# Patient Record
Sex: Female | Born: 1945 | Race: White | Hispanic: No | Smoking: Former smoker
Health system: Southern US, Community
[De-identification: ages and names within clinical notes are randomized; demographics above are authoritative.]

## PROBLEM LIST (undated history)

## (undated) DIAGNOSIS — E119 Type 2 diabetes mellitus without complications: Secondary | ICD-10-CM

## (undated) DIAGNOSIS — R0600 Dyspnea, unspecified: Secondary | ICD-10-CM

## (undated) DIAGNOSIS — E785 Hyperlipidemia, unspecified: Secondary | ICD-10-CM

## (undated) DIAGNOSIS — R011 Cardiac murmur, unspecified: Secondary | ICD-10-CM

## (undated) DIAGNOSIS — M1990 Unspecified osteoarthritis, unspecified site: Secondary | ICD-10-CM

## (undated) DIAGNOSIS — M797 Fibromyalgia: Secondary | ICD-10-CM

## (undated) DIAGNOSIS — R112 Nausea with vomiting, unspecified: Secondary | ICD-10-CM

## (undated) DIAGNOSIS — C801 Malignant (primary) neoplasm, unspecified: Secondary | ICD-10-CM

## (undated) DIAGNOSIS — K219 Gastro-esophageal reflux disease without esophagitis: Secondary | ICD-10-CM

## (undated) DIAGNOSIS — D649 Anemia, unspecified: Secondary | ICD-10-CM

## (undated) DIAGNOSIS — Z9889 Other specified postprocedural states: Secondary | ICD-10-CM

## (undated) DIAGNOSIS — F32A Depression, unspecified: Secondary | ICD-10-CM

## (undated) DIAGNOSIS — S91133A Puncture wound without foreign body of unspecified great toe without damage to nail, initial encounter: Secondary | ICD-10-CM

## (undated) DIAGNOSIS — F329 Major depressive disorder, single episode, unspecified: Secondary | ICD-10-CM

## (undated) DIAGNOSIS — G629 Polyneuropathy, unspecified: Secondary | ICD-10-CM

## (undated) HISTORY — PX: OTHER SURGICAL HISTORY: SHX169

## (undated) HISTORY — PX: CHOLECYSTECTOMY: SHX55

## (undated) HISTORY — PX: ABDOMINAL HYSTERECTOMY: SHX81

## (undated) MED FILL — Iron Sucrose Inj 20 MG/ML (Fe Equiv): INTRAVENOUS | Qty: 15 | Status: AC

---

## 2015-05-15 ENCOUNTER — Other Ambulatory Visit: Payer: Self-pay | Admitting: Physician Assistant
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
## 2015-05-17 ENCOUNTER — Encounter (HOSPITAL_COMMUNITY)
Admission: RE | Admit: 2015-05-17 | Discharge: 2015-05-17 | Disposition: A | Discharge status: Home / Self Care | Payer: Medicare Other | Source: Ambulatory Visit | Attending: Orthopedic Surgery | Admitting: Orthopedic Surgery

## 2015-05-17 ENCOUNTER — Encounter (HOSPITAL_COMMUNITY): Payer: Self-pay

## 2015-05-17 DIAGNOSIS — Z01810 Encounter for preprocedural cardiovascular examination: Secondary | ICD-10-CM | POA: Insufficient documentation

## 2015-05-17 DIAGNOSIS — M179 Osteoarthritis of knee, unspecified: Secondary | ICD-10-CM | POA: Insufficient documentation

## 2015-05-17 DIAGNOSIS — Z01812 Encounter for preprocedural laboratory examination: Secondary | ICD-10-CM | POA: Insufficient documentation

## 2015-05-17 DIAGNOSIS — Z0183 Encounter for blood typing: Secondary | ICD-10-CM | POA: Diagnosis not present

## 2015-05-17 HISTORY — DX: Type 2 diabetes mellitus without complications: E11.9

## 2015-05-17 HISTORY — DX: Hyperlipidemia, unspecified: E78.5

## 2015-05-17 HISTORY — DX: Other specified postprocedural states: Z98.890

## 2015-05-17 HISTORY — DX: Major depressive disorder, single episode, unspecified: F32.9

## 2015-05-17 HISTORY — DX: Depression, unspecified: F32.A

## 2015-05-17 HISTORY — DX: Anemia, unspecified: D64.9

## 2015-05-17 HISTORY — DX: Malignant (primary) neoplasm, unspecified: C80.1

## 2015-05-17 HISTORY — DX: Unspecified osteoarthritis, unspecified site: M19.90

## 2015-05-17 HISTORY — DX: Nausea with vomiting, unspecified: R11.2

## 2015-05-17 HISTORY — DX: Cardiac murmur, unspecified: R01.1

## 2015-05-17 HISTORY — DX: Gastro-esophageal reflux disease without esophagitis: K21.9

## 2015-05-17 HISTORY — DX: Fibromyalgia: M79.7

## 2015-05-17 LAB — COMPREHENSIVE METABOLIC PANEL
ALT: 31 U/L (ref 14–54)
AST: 38 U/L (ref 15–41)
Albumin: 3.6 g/dL (ref 3.5–5.0)
Alkaline Phosphatase: 50 U/L (ref 38–126)
Anion gap: 11 (ref 5–15)
BUN: 13 mg/dL (ref 6–20)
CO2: 26 mmol/L (ref 22–32)
Calcium: 9.4 mg/dL (ref 8.9–10.3)
Chloride: 100 mmol/L — ABNORMAL LOW (ref 101–111)
Creatinine, Ser: 0.97 mg/dL (ref 0.44–1.00)
GFR calc Af Amer: >60 mL/min (ref >60)
GFR calc non Af Amer: 57 mL/min — ABNORMAL LOW (ref >60)
Glucose, Bld: 225 mg/dL — ABNORMAL HIGH (ref 65–99)
Potassium: 3.6 mmol/L (ref 3.5–5.1)
Sodium: 137 mmol/L (ref 135–145)
Total Bilirubin: 0.5 mg/dL (ref 0.3–1.2)
Total Protein: 7.2 g/dL (ref 6.5–8.1)

## 2015-05-17 LAB — CBC WITH DIFFERENTIAL/PLATELET
Basophils Absolute: 0 10*3/uL (ref 0.0–0.1)
Basophils Relative: 0 %
Eosinophils Absolute: 0.4 10*3/uL (ref 0.0–0.7)
Eosinophils Relative: 4 %
HCT: 40.7 % (ref 36.0–46.0)
Hemoglobin: 13 g/dL (ref 12.0–15.0)
Lymphocytes Relative: 36 %
Lymphs Abs: 3.3 10*3/uL (ref 0.7–4.0)
MCH: 28.4 pg (ref 26.0–34.0)
MCHC: 31.9 g/dL (ref 30.0–36.0)
MCV: 89.1 fL (ref 78.0–100.0)
Monocytes Absolute: 0.6 10*3/uL (ref 0.1–1.0)
Monocytes Relative: 6 %
Neutro Abs: 4.9 10*3/uL (ref 1.7–7.7)
Neutrophils Relative %: 54 %
Platelets: 271 10*3/uL (ref 150–400)
RBC: 4.57 MIL/uL (ref 3.87–5.11)
RDW: 14.1 % (ref 11.5–15.5)
WBC: 9.2 10*3/uL (ref 4.0–10.5)

## 2015-05-17 LAB — TYPE AND SCREEN
ABO/RH(D): O POS
Antibody Screen: NEGATIVE

## 2015-05-17 LAB — SURGICAL PCR SCREEN
MRSA, PCR: NEGATIVE
Staphylococcus aureus: NEGATIVE

## 2015-05-17 LAB — APTT: aPTT: 28 seconds (ref 24–37)

## 2015-05-17 LAB — GLUCOSE, CAPILLARY: Glucose-Capillary: 226 mg/dL — ABNORMAL HIGH (ref 65–99)

## 2015-05-17 LAB — PROTIME-INR
INR: 1.04 (ref 0.00–1.49)
Prothrombin Time: 13.8 seconds (ref 11.6–15.2)

## 2015-05-17 LAB — ABO/RH: ABO/RH(D): O POS

## 2015-05-17 NOTE — Progress Notes (Signed)
Dr. Debroah Loop office notified that we do not have TED hose that will fit patient. Calf circumference 19 inches and thigh circumference 24 inches.

## 2015-05-17 NOTE — Pre-Procedure Instructions (Signed)
Wanda Curry  05/17/2015      [id number] [place], [place] - [place] [address] Phone: [phone] Fax: [phone]    Your procedure is scheduled on 05-30-2015   Wednesday   Report to [facility] Admitting at 9:00A.M.   Call this number if you have problems the morning of surgery:  [phone]   Remember:  Do not eat food or drink liquids after midnight.   Take these medicines the morning of surgery with A SIP OF WATER Carvedilol(Coreg),Gabapentin(neurontin),pain medication as needed,omeprazole(Prilosec),requip    Do not wear jewelry, make-up or nail polish.  Do not wear lotions, powders, or perfumes.  You may not wear deodorant.  Do not shave 48 hours prior to surgery.     Do not bring valuables to the hospital.  [facility] is not responsible for any belongings or valuables.  Contacts, dentures or bridgework may not be worn into surgery.  Leave your suitcase in the car.  After surgery it may be brought to your room.  For patients admitted to the hospital, discharge time will be determined by your treatment team.     Special instructions:  See attached sheet "Preparing for Surgery" for instructions on CHG shower      Please read over the following fact sheets that you were given. Pain Booklet, Coughing and Deep Breathing, Blood Transfusion Information and Surgical Site Infection Prevention                                                     How to Manage Your Diabetes Before Surgery   Why is it important to control my blood sugar before and after surgery?   Improving blood sugar levels before and after surgery helps healing and can limit problems.  A way of improving blood sugar control is eating a healthy diet by:  - Eating less sugar and carbohydrates  - Increasing activity/exercise  - Talk with your doctor about reaching your blood sugar goals  High blood sugars (greater than 180 mg/dL) can  raise your risk of infections and slow down your recovery so you will need to focus on controlling your diabetes during the weeks before surgery.  Make sure that the doctor who takes care of your diabetes knows about your planned surgery including the date and location.  How do I manage my blood sugars before surgery?   Check your blood sugar at least 4 times a day, 2 days before surgery to make sure that they are not too high or low.   Check your blood sugar the morning of your surgery when you wake up and every 2  hours until you get to the Short-Stay unit.  If your blood sugar is less than 70 mg/dL, you will need to treat for low blood sugar by:  Treat a low blood sugar (less than 70 mg/dL) with 1/2 cup of clear juice (cranberry or apple), 4 glucose tablets, OR glucose gel.  Recheck blood sugar in 15 minutes after treatment (to make sure it is greater than 70 mg/dL).  If blood sugar is not greater than 70 mg/dL on re-check, call [phone] for further instructions.   Report your blood sugar to the Short-Stay nurse when you get to Short-Stay.  References:  [facility],  2007 "How to Manage your Diabetes Before and After Surgery".  What do I do about my diabetes medications?   Do not take oral diabetes medicines (pills) the morning of surgery.    THE NIGHT BEFORE SURGERY, take 14 units of  Novolog  Insulin and 44 units of lantus    THE MORNING OF SURGERY, take 11 units of Novolog  Insulin.    Do not take other diabetes injectables the day of surgery including Byetta, Victoza, Bydureon, and Trulicity.        .     .  .

## 2015-05-18 LAB — URINE CULTURE

## 2015-05-18 LAB — HEMOGLOBIN A1C
Hgb A1c MFr Bld: 8.8 % — ABNORMAL HIGH (ref 4.8–5.6)
Mean Plasma Glucose: 206 mg/dL

## 2015-05-28 NOTE — Progress Notes (Signed)
HgA1C 8.8.  Will have Bryson Ha or [place] review.  Called Dr Rosalita Chessman in [place] and requested Echo done on 05/25/15.  They state they will fax this over.

## 2015-05-29 ENCOUNTER — Encounter (HOSPITAL_COMMUNITY): Payer: Self-pay

## 2015-05-29 MED ORDER — CEFAZOLIN SODIUM-DEXTROSE 2-3 GM-% IV SOLR
2.0000 g | INTRAVENOUS | Status: AC
  Administered 2015-05-30: 2 g via INTRAVENOUS
  Filled 2015-05-29: qty 50
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
## 2015-05-30 ENCOUNTER — Inpatient Hospital Stay (HOSPITAL_COMMUNITY): Payer: Medicare Other | Admitting: Certified Registered Nurse Anesthetist

## 2015-05-30 ENCOUNTER — Inpatient Hospital Stay (HOSPITAL_COMMUNITY): Payer: Medicare Other

## 2015-05-30 ENCOUNTER — Encounter (HOSPITAL_COMMUNITY)
Admission: RE | Disposition: A | Discharge status: Home Health | Payer: Self-pay | Source: Ambulatory Visit | Attending: Orthopedic Surgery

## 2015-05-30 ENCOUNTER — Inpatient Hospital Stay (HOSPITAL_COMMUNITY): Payer: Medicare Other | Admitting: Vascular Surgery

## 2015-05-30 ENCOUNTER — Encounter (HOSPITAL_COMMUNITY): Payer: Self-pay | Admitting: *Deleted

## 2015-05-30 ENCOUNTER — Inpatient Hospital Stay (HOSPITAL_COMMUNITY)
Admission: RE | Admit: 2015-05-30 | Discharge: 2015-05-31 | DRG: 470 | Disposition: A | Discharge status: Home Health | Payer: Medicare Other | Source: Ambulatory Visit | Attending: Orthopedic Surgery | Admitting: Orthopedic Surgery

## 2015-05-30 DIAGNOSIS — Z96659 Presence of unspecified artificial knee joint: Secondary | ICD-10-CM

## 2015-05-30 DIAGNOSIS — Z85828 Personal history of other malignant neoplasm of skin: Secondary | ICD-10-CM | POA: Diagnosis not present

## 2015-05-30 DIAGNOSIS — M1731 Unilateral post-traumatic osteoarthritis, right knee: Principal | ICD-10-CM | POA: Diagnosis present

## 2015-05-30 DIAGNOSIS — E785 Hyperlipidemia, unspecified: Secondary | ICD-10-CM | POA: Diagnosis present

## 2015-05-30 DIAGNOSIS — D62 Acute posthemorrhagic anemia: Secondary | ICD-10-CM | POA: Diagnosis not present

## 2015-05-30 DIAGNOSIS — K219 Gastro-esophageal reflux disease without esophagitis: Secondary | ICD-10-CM | POA: Diagnosis present

## 2015-05-30 DIAGNOSIS — Z7982 Long term (current) use of aspirin: Secondary | ICD-10-CM

## 2015-05-30 DIAGNOSIS — M797 Fibromyalgia: Secondary | ICD-10-CM | POA: Diagnosis not present

## 2015-05-30 DIAGNOSIS — M179 Osteoarthritis of knee, unspecified: Secondary | ICD-10-CM | POA: Diagnosis present

## 2015-05-30 DIAGNOSIS — Z87891 Personal history of nicotine dependence: Secondary | ICD-10-CM | POA: Diagnosis not present

## 2015-05-30 DIAGNOSIS — M25561 Pain in right knee: Secondary | ICD-10-CM | POA: Diagnosis present

## 2015-05-30 DIAGNOSIS — M069 Rheumatoid arthritis, unspecified: Secondary | ICD-10-CM | POA: Diagnosis present

## 2015-05-30 DIAGNOSIS — E119 Type 2 diabetes mellitus without complications: Secondary | ICD-10-CM | POA: Diagnosis present

## 2015-05-30 DIAGNOSIS — M1710 Unilateral primary osteoarthritis, unspecified knee: Secondary | ICD-10-CM | POA: Diagnosis present

## 2015-05-30 HISTORY — PX: HARDWARE REMOVAL: SHX979

## 2015-05-30 HISTORY — PX: TOTAL KNEE ARTHROPLASTY: SHX125

## 2015-05-30 LAB — GLUCOSE, CAPILLARY
Glucose-Capillary: 190 mg/dL — ABNORMAL HIGH (ref 65–99)
Glucose-Capillary: 223 mg/dL — ABNORMAL HIGH (ref 65–99)
Glucose-Capillary: 253 mg/dL — ABNORMAL HIGH (ref 65–99)
Glucose-Capillary: 321 mg/dL — ABNORMAL HIGH (ref 65–99)

## 2015-05-30 SURGERY — ARTHROPLASTY, KNEE, TOTAL
Anesthesia: General | Laterality: Right

## 2015-05-30 MED ORDER — HYDROMORPHONE HCL 1 MG/ML IJ SOLN
0.2500 mg | INTRAMUSCULAR | Status: DC | PRN
  Administered 2015-05-30 (×4): 0.5 mg via INTRAVENOUS

## 2015-05-30 MED ORDER — CHLORHEXIDINE GLUCONATE 4 % EX LIQD
60.0000 mL | Freq: Once | CUTANEOUS | Status: DC
Start: 2015-05-30 — End: 2015-05-30

## 2015-05-30 MED ORDER — MIDAZOLAM HCL 5 MG/5ML IJ SOLN
INTRAMUSCULAR | Status: DC | PRN
  Administered 2015-05-30: 2 mg via INTRAVENOUS

## 2015-05-30 MED ORDER — METOCLOPRAMIDE HCL 5 MG/ML IJ SOLN: 5.0000 mg | Freq: Three times a day (TID) | INTRAMUSCULAR | Status: DC | PRN

## 2015-05-30 MED ORDER — SODIUM CHLORIDE 0.9 % IJ SOLN
INTRAMUSCULAR | Status: DC | PRN
  Administered 2015-05-30: 10 mL via INTRAVENOUS

## 2015-05-30 MED ORDER — CEFAZOLIN SODIUM-DEXTROSE 2-3 GM-% IV SOLR
2.0000 g | Freq: Four times a day (QID) | INTRAVENOUS | Status: AC
  Administered 2015-05-30 (×2): 2 g via INTRAVENOUS
  Filled 2015-05-30 (×2): qty 50

## 2015-05-30 MED ORDER — MENTHOL 3 MG MT LOZG: 1.0000 | LOZENGE | OROMUCOSAL | Status: DC | PRN

## 2015-05-30 MED ORDER — ACETAMINOPHEN 325 MG PO TABS: 650.0000 mg | ORAL_TABLET | Freq: Four times a day (QID) | ORAL | Status: DC | PRN

## 2015-05-30 MED ORDER — HYDROCHLOROTHIAZIDE 25 MG PO TABS
25.0000 mg | ORAL_TABLET | Freq: Every day | ORAL | Status: DC
  Administered 2015-05-30 – 2015-05-31 (×2): 25 mg via ORAL
  Filled 2015-05-30 (×2): qty 1

## 2015-05-30 MED ORDER — PANTOPRAZOLE SODIUM 40 MG PO TBEC
80.0000 mg | DELAYED_RELEASE_TABLET | Freq: Every day | ORAL | Status: DC
  Administered 2015-05-30 – 2015-05-31 (×2): 80 mg via ORAL
  Filled 2015-05-30 (×2): qty 2

## 2015-05-30 MED ORDER — DOCUSATE SODIUM 100 MG PO CAPS
100.0000 mg | ORAL_CAPSULE | Freq: Two times a day (BID) | ORAL | Status: DC
  Administered 2015-05-30 – 2015-05-31 (×3): 100 mg via ORAL
  Filled 2015-05-30 (×3): qty 1

## 2015-05-30 MED ORDER — OXYCODONE-ACETAMINOPHEN 5-325 MG PO TABS: 1.0000 | ORAL_TABLET | ORAL | Status: DC | PRN

## 2015-05-30 MED ORDER — HYDROMORPHONE HCL 1 MG/ML IJ SOLN
0.5000 mg | INTRAMUSCULAR | Status: DC | PRN
  Administered 2015-05-30 – 2015-05-31 (×2): 0.5 mg via INTRAVENOUS
  Filled 2015-05-30 (×2): qty 1

## 2015-05-30 MED ORDER — BISACODYL 5 MG PO TBEC: 5.0000 mg | DELAYED_RELEASE_TABLET | Freq: Every day | ORAL | Status: DC | PRN

## 2015-05-30 MED ORDER — BUPIVACAINE HCL (PF) 0.25 % IJ SOLN
INTRAMUSCULAR | Status: AC
  Filled 2015-05-30: qty 60

## 2015-05-30 MED ORDER — APIXABAN 2.5 MG PO TABS: ORAL_TABLET | ORAL | Status: DC

## 2015-05-30 MED ORDER — OXYCODONE HCL 5 MG PO TABS
5.0000 mg | ORAL_TABLET | ORAL | Status: DC | PRN
  Administered 2015-05-30: 10 mg via ORAL
  Administered 2015-05-30 – 2015-05-31 (×6): 5 mg via ORAL
  Filled 2015-05-30 (×6): qty 1

## 2015-05-30 MED ORDER — ZOLPIDEM TARTRATE 5 MG PO TABS: 5.0000 mg | ORAL_TABLET | Freq: Every evening | ORAL | Status: DC | PRN

## 2015-05-30 MED ORDER — ONDANSETRON HCL 4 MG/2ML IJ SOLN: 4.0000 mg | Freq: Four times a day (QID) | INTRAMUSCULAR | Status: DC | PRN

## 2015-05-30 MED ORDER — APIXABAN 2.5 MG PO TABS
2.5000 mg | ORAL_TABLET | Freq: Two times a day (BID) | ORAL | Status: DC
  Administered 2015-05-31: 2.5 mg via ORAL
  Filled 2015-05-30: qty 1

## 2015-05-30 MED ORDER — MAGNESIUM CITRATE PO SOLN: 1.0000 | Freq: Once | ORAL | Status: DC | PRN

## 2015-05-30 MED ORDER — ONDANSETRON HCL 4 MG/2ML IJ SOLN
INTRAMUSCULAR | Status: DC | PRN
  Administered 2015-05-30: 4 mg via INTRAVENOUS

## 2015-05-30 MED ORDER — INSULIN ASPART 100 UNIT/ML ~~LOC~~ SOLN
0.0000 [IU] | Freq: Three times a day (TID) | SUBCUTANEOUS | Status: DC
  Administered 2015-05-30: 5 [IU] via SUBCUTANEOUS
  Administered 2015-05-31: 2 [IU] via SUBCUTANEOUS
  Administered 2015-05-31: 3 [IU] via SUBCUTANEOUS

## 2015-05-30 MED ORDER — GABAPENTIN 600 MG PO TABS
600.0000 mg | ORAL_TABLET | Freq: Three times a day (TID) | ORAL | Status: DC
  Administered 2015-05-30 – 2015-05-31 (×3): 600 mg via ORAL
  Filled 2015-05-30 (×6): qty 1

## 2015-05-30 MED ORDER — POTASSIUM CHLORIDE IN NACL 20-0.9 MEQ/L-% IV SOLN
INTRAVENOUS | Status: DC
  Administered 2015-05-30: 16:00:00 via INTRAVENOUS
  Filled 2015-05-30 (×2): qty 1000

## 2015-05-30 MED ORDER — LIDOCAINE HCL (CARDIAC) 20 MG/ML IV SOLN
INTRAVENOUS | Status: DC | PRN
Start: 2015-05-30 — End: 2015-05-30
  Administered 2015-05-30: 50 mg via INTRAVENOUS

## 2015-05-30 MED ORDER — HYDROMORPHONE HCL 1 MG/ML IJ SOLN
INTRAMUSCULAR | Status: AC
  Administered 2015-05-30: 0.5 mg via INTRAVENOUS
  Filled 2015-05-30: qty 1

## 2015-05-30 MED ORDER — METHOCARBAMOL 500 MG PO TABS
500.0000 mg | ORAL_TABLET | Freq: Four times a day (QID) | ORAL | Status: DC | PRN
  Administered 2015-05-30 – 2015-05-31 (×4): 500 mg via ORAL
  Filled 2015-05-30 (×5): qty 1

## 2015-05-30 MED ORDER — METOCLOPRAMIDE HCL 5 MG PO TABS: 5.0000 mg | ORAL_TABLET | Freq: Three times a day (TID) | ORAL | Status: DC | PRN

## 2015-05-30 MED ORDER — ATORVASTATIN CALCIUM 20 MG PO TABS
20.0000 mg | ORAL_TABLET | Freq: Every day | ORAL | Status: DC
  Administered 2015-05-30 – 2015-05-31 (×2): 20 mg via ORAL
  Filled 2015-05-30: qty 1
  Filled 2015-05-30: qty 2
  Filled 2015-05-30: qty 1

## 2015-05-30 MED ORDER — FENTANYL CITRATE (PF) 100 MCG/2ML IJ SOLN
INTRAMUSCULAR | Status: DC | PRN
  Administered 2015-05-30 (×2): 25 ug via INTRAVENOUS
  Administered 2015-05-30: 50 ug via INTRAVENOUS
  Administered 2015-05-30 (×2): 25 ug via INTRAVENOUS
  Administered 2015-05-30: 75 ug via INTRAVENOUS
  Administered 2015-05-30: 25 ug via INTRAVENOUS

## 2015-05-30 MED ORDER — BUPIVACAINE LIPOSOME 1.3 % IJ SUSP
20.0000 mL | INTRAMUSCULAR | Status: AC
  Administered 2015-05-30: 20 mL
  Filled 2015-05-30: qty 20

## 2015-05-30 MED ORDER — SCOPOLAMINE 1 MG/3DAYS TD PT72
MEDICATED_PATCH | TRANSDERMAL | Status: DC | PRN
  Administered 2015-05-30: 1 via TRANSDERMAL

## 2015-05-30 MED ORDER — INFLUENZA VAC SPLIT QUAD 0.5 ML IM SUSY
0.5000 mL | PREFILLED_SYRINGE | INTRAMUSCULAR | Status: AC
  Administered 2015-05-31: 0.5 mL via INTRAMUSCULAR
  Filled 2015-05-30: qty 0.5

## 2015-05-30 MED ORDER — TRANEXAMIC ACID 1000 MG/10ML IV SOLN
1000.0000 mg | INTRAVENOUS | Status: AC
  Administered 2015-05-30: 1000 mg via INTRAVENOUS
  Filled 2015-05-30: qty 10

## 2015-05-30 MED ORDER — SCOPOLAMINE 1 MG/3DAYS TD PT72
MEDICATED_PATCH | TRANSDERMAL | Status: AC
  Filled 2015-05-30: qty 1

## 2015-05-30 MED ORDER — DEXAMETHASONE SODIUM PHOSPHATE 4 MG/ML IJ SOLN
INTRAMUSCULAR | Status: AC
  Filled 2015-05-30: qty 1

## 2015-05-30 MED ORDER — ROPINIROLE HCL 1 MG PO TABS
1.0000 mg | ORAL_TABLET | Freq: Three times a day (TID) | ORAL | Status: DC
  Administered 2015-05-30 – 2015-05-31 (×3): 1 mg via ORAL
  Filled 2015-05-30 (×3): qty 1

## 2015-05-30 MED ORDER — ONDANSETRON HCL 4 MG PO TABS: 4.0000 mg | ORAL_TABLET | Freq: Four times a day (QID) | ORAL | Status: DC | PRN

## 2015-05-30 MED ORDER — PROMETHAZINE HCL 25 MG/ML IJ SOLN: 6.2500 mg | INTRAMUSCULAR | Status: DC | PRN

## 2015-05-30 MED ORDER — MIDAZOLAM HCL 2 MG/2ML IJ SOLN
INTRAMUSCULAR | Status: AC
  Filled 2015-05-30: qty 4

## 2015-05-30 MED ORDER — LACTATED RINGERS IV SOLN
INTRAVENOUS | Status: DC
  Administered 2015-05-30 (×2): via INTRAVENOUS

## 2015-05-30 MED ORDER — METHOCARBAMOL 500 MG PO TABS: 500.0000 mg | ORAL_TABLET | Freq: Four times a day (QID) | ORAL | Status: DC

## 2015-05-30 MED ORDER — ONDANSETRON HCL 4 MG PO TABS: 4.0000 mg | ORAL_TABLET | Freq: Three times a day (TID) | ORAL | Status: DC | PRN

## 2015-05-30 MED ORDER — PHENOL 1.4 % MT LIQD: 1.0000 | OROMUCOSAL | Status: DC | PRN

## 2015-05-30 MED ORDER — BUPIVACAINE HCL (PF) 0.5 % IJ SOLN
INTRAMUSCULAR | Status: AC
  Filled 2015-05-30: qty 30

## 2015-05-30 MED ORDER — DEXAMETHASONE SODIUM PHOSPHATE 4 MG/ML IJ SOLN
INTRAMUSCULAR | Status: DC | PRN
  Administered 2015-05-30: 4 mg via INTRAVENOUS

## 2015-05-30 MED ORDER — ACETAMINOPHEN 650 MG RE SUPP: 650.0000 mg | Freq: Four times a day (QID) | RECTAL | Status: DC | PRN

## 2015-05-30 MED ORDER — LIDOCAINE HCL (CARDIAC) 20 MG/ML IV SOLN
INTRAVENOUS | Status: AC
  Filled 2015-05-30: qty 10

## 2015-05-30 MED ORDER — FENTANYL CITRATE (PF) 250 MCG/5ML IJ SOLN
INTRAMUSCULAR | Status: AC
  Filled 2015-05-30: qty 5

## 2015-05-30 MED ORDER — LABETALOL HCL 5 MG/ML IV SOLN
INTRAVENOUS | Status: DC | PRN
  Administered 2015-05-30 (×2): 5 mg via INTRAVENOUS

## 2015-05-30 MED ORDER — METHOCARBAMOL 500 MG PO TABS
ORAL_TABLET | ORAL | Status: AC
  Administered 2015-05-30: 500 mg via ORAL
  Filled 2015-05-30: qty 1

## 2015-05-30 MED ORDER — SENNOSIDES-DOCUSATE SODIUM 8.6-50 MG PO TABS: 1.0000 | ORAL_TABLET | Freq: Every evening | ORAL | Status: DC | PRN

## 2015-05-30 MED ORDER — SODIUM CHLORIDE 0.9 % IR SOLN
Status: DC | PRN
  Administered 2015-05-30: 3000 mL

## 2015-05-30 MED ORDER — PROPOFOL 10 MG/ML IV BOLUS
INTRAVENOUS | Status: DC | PRN
  Administered 2015-05-30: 120 mg via INTRAVENOUS

## 2015-05-30 MED ORDER — CARVEDILOL 6.25 MG PO TABS
6.2500 mg | ORAL_TABLET | Freq: Two times a day (BID) | ORAL | Status: DC
  Administered 2015-05-30 – 2015-05-31 (×2): 6.25 mg via ORAL
  Filled 2015-05-30 (×2): qty 1

## 2015-05-30 MED ORDER — CHLORHEXIDINE GLUCONATE 4 % EX LIQD: 60.0000 mL | Freq: Once | CUTANEOUS | Status: DC

## 2015-05-30 MED ORDER — BUPIVACAINE HCL 0.5 % IJ SOLN
INTRAMUSCULAR | Status: DC | PRN
  Administered 2015-05-30: 30 mL

## 2015-05-30 MED ORDER — OXYCODONE HCL 5 MG PO TABS
ORAL_TABLET | ORAL | Status: AC
  Filled 2015-05-30: qty 2

## 2015-05-30 MED ORDER — DIPHENHYDRAMINE HCL 12.5 MG/5ML PO ELIX: 12.5000 mg | ORAL_SOLUTION | ORAL | Status: DC | PRN

## 2015-05-30 MED ORDER — BUPIVACAINE HCL (PF) 0.25 % IJ SOLN: INTRAMUSCULAR | Status: DC | PRN

## 2015-05-30 MED ORDER — METHOCARBAMOL 1000 MG/10ML IJ SOLN
500.0000 mg | Freq: Four times a day (QID) | INTRAVENOUS | Status: DC | PRN
  Filled 2015-05-30: qty 5

## 2015-05-30 SURGICAL SUPPLY — 85 items
BANDAGE ELASTIC 4 VELCRO ST LF (GAUZE/BANDAGES/DRESSINGS) ×3 IMPLANT
BANDAGE ELASTIC 6 VELCRO ST LF (GAUZE/BANDAGES/DRESSINGS) ×3 IMPLANT
BANDAGE ESMARK 6X9 LF (GAUZE/BANDAGES/DRESSINGS) ×1 IMPLANT
BENZOIN TINCTURE PRP APPL 2/3 (GAUZE/BANDAGES/DRESSINGS) ×3 IMPLANT
BLADE SAG 18X100X1.27 (BLADE) ×6 IMPLANT
BLADE SURG 10 STRL SS (BLADE) ×9 IMPLANT
BNDG ESMARK 6X9 LF (GAUZE/BANDAGES/DRESSINGS) ×3
BOOTCOVER CLEANROOM LRG (PROTECTIVE WEAR) ×6 IMPLANT
BOWL SMART MIX CTS (DISPOSABLE) ×3 IMPLANT
CAPT KNEE TOTAL 3 ×3 IMPLANT
CEMENT BONE SIMPLEX SPEEDSET (Cement) ×6 IMPLANT
CLOSURE STERI-STRIP 1/2X4 (GAUZE/BANDAGES/DRESSINGS) ×1
CLOSURE WOUND 1/2 X4 (GAUZE/BANDAGES/DRESSINGS) ×2
CLSR STERI-STRIP ANTIMIC 1/2X4 (GAUZE/BANDAGES/DRESSINGS) ×2 IMPLANT
COVER SURGICAL LIGHT HANDLE (MISCELLANEOUS) ×3 IMPLANT
CUFF TOURNIQUET SINGLE 18IN (TOURNIQUET CUFF) IMPLANT
CUFF TOURNIQUET SINGLE 24IN (TOURNIQUET CUFF) IMPLANT
CUFF TOURNIQUET SINGLE 34IN LL (TOURNIQUET CUFF) ×3 IMPLANT
CUFF TOURNIQUET SINGLE 44IN (TOURNIQUET CUFF) ×3 IMPLANT
DECANTER SPIKE VIAL GLASS SM (MISCELLANEOUS) IMPLANT
DRAPE C-ARM 42X72 X-RAY (DRAPES) IMPLANT
DRAPE EXTREMITY T 121X128X90 (DRAPE) ×3 IMPLANT
DRAPE IMP U-DRAPE 54X76 (DRAPES) ×3 IMPLANT
DRAPE OEC MINIVIEW 54X84 (DRAPES) IMPLANT
DRAPE PROXIMA HALF (DRAPES) ×3 IMPLANT
DRAPE U-SHAPE 47X51 STRL (DRAPES) ×3 IMPLANT
DRAPE X RAY CASS MED 25220 (DRAPES) IMPLANT
DRAPE X-RAY CASS 24X20 (DRAPES) IMPLANT
DRSG PAD ABDOMINAL 8X10 ST (GAUZE/BANDAGES/DRESSINGS) ×3 IMPLANT
DURAPREP 26ML APPLICATOR (WOUND CARE) ×6 IMPLANT
ELECT CAUTERY BLADE 6.4 (BLADE) ×3 IMPLANT
ELECT REM PT RETURN 9FT ADLT (ELECTROSURGICAL) ×3
ELECTRODE REM PT RTRN 9FT ADLT (ELECTROSURGICAL) ×1 IMPLANT
EVACUATOR 1/8 PVC DRAIN (DRAIN) ×3 IMPLANT
FACESHIELD WRAPAROUND (MASK) ×9 IMPLANT
GAUZE SPONGE 4X4 12PLY STRL (GAUZE/BANDAGES/DRESSINGS) ×3 IMPLANT
GAUZE XEROFORM 1X8 LF (GAUZE/BANDAGES/DRESSINGS) ×6 IMPLANT
GLOVE BIOGEL PI IND STRL 7.0 (GLOVE) ×4 IMPLANT
GLOVE BIOGEL PI INDICATOR 7.0 (GLOVE) ×8
GLOVE ECLIPSE 7.0 STRL STRAW (GLOVE) ×3 IMPLANT
GLOVE ORTHO TXT STRL SZ7.5 (GLOVE) ×6 IMPLANT
GLOVE SURG ORTHO 7.0 STRL STRW (GLOVE) ×3 IMPLANT
GOWN STRL REUS W/ TWL LRG LVL3 (GOWN DISPOSABLE) ×2 IMPLANT
GOWN STRL REUS W/ TWL XL LVL3 (GOWN DISPOSABLE) ×1 IMPLANT
GOWN STRL REUS W/TWL LRG LVL3 (GOWN DISPOSABLE) ×4
GOWN STRL REUS W/TWL XL LVL3 (GOWN DISPOSABLE) ×2
HANDPIECE INTERPULSE COAX TIP (DISPOSABLE) ×2
IMMOBILIZER KNEE 22 UNIV (SOFTGOODS) ×3 IMPLANT
IMMOBILIZER KNEE 24 THIGH 36 (MISCELLANEOUS) IMPLANT
IMMOBILIZER KNEE 24 UNIV (MISCELLANEOUS)
KIT BASIN OR (CUSTOM PROCEDURE TRAY) ×3 IMPLANT
KIT ROOM TURNOVER OR (KITS) ×3 IMPLANT
MANIFOLD NEPTUNE II (INSTRUMENTS) ×3 IMPLANT
NEEDLE 18GX1X1/2 (RX/OR ONLY) (NEEDLE) ×3 IMPLANT
NEEDLE HYPO 25GX1X1/2 BEV (NEEDLE) ×3 IMPLANT
NS IRRIG 1000ML POUR BTL (IV SOLUTION) ×3 IMPLANT
PACK ORTHO EXTREMITY (CUSTOM PROCEDURE TRAY) ×3 IMPLANT
PACK TOTAL JOINT (CUSTOM PROCEDURE TRAY) ×3 IMPLANT
PACK UNIVERSAL I (CUSTOM PROCEDURE TRAY) ×3 IMPLANT
PAD ARMBOARD 7.5X6 YLW CONV (MISCELLANEOUS) ×6 IMPLANT
PAD CAST 4YDX4 CTTN HI CHSV (CAST SUPPLIES) ×2 IMPLANT
PADDING CAST COTTON 4X4 STRL (CAST SUPPLIES) ×4
SET HNDPC FAN SPRY TIP SCT (DISPOSABLE) ×1 IMPLANT
SPONGE LAP 4X18 X RAY DECT (DISPOSABLE) ×6 IMPLANT
STAPLER VISISTAT 35W (STAPLE) ×3 IMPLANT
STRIP CLOSURE SKIN 1/2X4 (GAUZE/BANDAGES/DRESSINGS) ×4 IMPLANT
SUCTION FRAZIER TIP 10 FR DISP (SUCTIONS) ×3 IMPLANT
SUT MNCRL AB 4-0 PS2 18 (SUTURE) ×6 IMPLANT
SUT VIC AB 0 CT1 27 (SUTURE)
SUT VIC AB 0 CT1 27XBRD ANBCTR (SUTURE) IMPLANT
SUT VIC AB 0 CTB1 27 (SUTURE) ×6 IMPLANT
SUT VIC AB 1 CTX 36 (SUTURE) ×2
SUT VIC AB 1 CTX36XBRD ANBCTR (SUTURE) ×1 IMPLANT
SUT VIC AB 2-0 CT1 27 (SUTURE) ×4
SUT VIC AB 2-0 CT1 TAPERPNT 27 (SUTURE) ×2 IMPLANT
SUT VIC AB 2-0 CTB1 (SUTURE) ×12 IMPLANT
SYR 50ML LL SCALE MARK (SYRINGE) ×3 IMPLANT
SYR CONTROL 10ML LL (SYRINGE) ×3 IMPLANT
TOWEL OR 17X24 6PK STRL BLUE (TOWEL DISPOSABLE) ×3 IMPLANT
TOWEL OR 17X26 10 PK STRL BLUE (TOWEL DISPOSABLE) ×3 IMPLANT
TUBE CONNECTING 12'X1/4 (SUCTIONS) ×1
TUBE CONNECTING 12X1/4 (SUCTIONS) ×2 IMPLANT
UNDERPAD 30X30 INCONTINENT (UNDERPADS AND DIAPERS) ×3 IMPLANT
WATER STERILE IRR 1000ML POUR (IV SOLUTION) ×3 IMPLANT
YANKAUER SUCT BULB TIP NO VENT (SUCTIONS) IMPLANT

## 2015-05-30 NOTE — Interval H&P Note (Signed)
History and Physical Interval Note:  05/30/2015 8:34 AM  Wanda Curry  has presented today for surgery, with the diagnosis of DJD RIGHT KNEE, MECHANICAL COMPLICATION  The various methods of treatment have been discussed with the patient and family. After consideration of risks, benefits and other options for treatment, the patient has consented to  Procedure(s): RIGHT TOTAL KNEE ARTHROPLASTY (Right) HARDWARE REMOVAL (Right) as a surgical intervention .  The patient's history has been reviewed, patient examined, no change in status, stable for surgery.  I have reviewed the patient's chart and labs.  Questions were answered to the patient's satisfaction.     Wanda Curry
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
## 2015-05-30 NOTE — Progress Notes (Signed)
Orthopedic Tech Progress Note Patient Details:  Wanda Curry [birth year] [id number] Applied CPM to RLE.  Applied OHF with trapeze to pt.'s bed.  Left Bone Foam with pt.'s nurse. CPM Right Knee CPM Right Knee: On Right Knee Flexion (Degrees): 90 Right Knee Extension (Degrees): 0   Darrol Poke 05/30/2015, 1:56 PM

## 2015-05-30 NOTE — Discharge Instructions (Signed)
INSTRUCTIONS AFTER JOINT REPLACEMENT   o Remove items at home which could result in a fall. This includes throw rugs or furniture in walking pathways o ICE to the affected joint every three hours while awake for 30 minutes at a time, for at least the first 3-5 days, and then as needed for pain and swelling.  Continue to use ice for pain and swelling. You may notice swelling that will progress down to the foot and ankle.  This is normal after surgery.  Elevate your leg when you are not up walking on it.   o Continue to use the breathing machine you got in the hospital (incentive spirometer) which will help keep your temperature down.  It is common for your temperature to cycle up and down following surgery, especially at night when you are not up moving around and exerting yourself.  The breathing machine keeps your lungs expanded and your temperature down.  TAKE ELIQUIS AS DIRECTED FOR A TOTAL OF 14 DAYS FOLLOWING SURGERY.  ONCE FINISHED WITH THIS, TAKE ASPIRIN 325 MG ONE TAB ONCE DAILY FOR THE NEXT 14 DAYS.  THESE MEDICATIONS ARE USED TO PREVENT BLOOD CLOTS.  DIET:  As you were doing prior to hospitalization, we recommend a well-balanced diet.  DRESSING / WOUND CARE / SHOWERING  You may change your dressing 3-5 days after surgery.  Then change the dressing every day with sterile gauze.  Please use good hand washing techniques before changing the dressing.  Do not use any lotions or creams on the incision until instructed by your surgeon. and You may shower 3 days after surgery, but keep the wounds dry during showering.  You may use an occlusive plastic wrap (Press'n Seal for example), NO SOAKING/SUBMERGING IN THE BATHTUB.  If the bandage gets wet, change with a clean dry gauze.  If the incision gets wet, pat the wound dry with a clean towel.  ACTIVITY  o Increase activity slowly as tolerated, but follow the weight bearing instructions below.   o No driving for 6 weeks or until further direction  given by your physician.  You cannot drive while taking narcotics.  o No lifting or carrying greater than 10 lbs. until further directed by your surgeon. o Avoid periods of inactivity such as sitting longer than an hour when not asleep. This helps prevent blood clots.  o You may return to work once you are authorized by your doctor.     WEIGHT BEARING   Weight bearing as tolerated with assist device (walker, cane, etc) as directed, use it as long as suggested by your surgeon or therapist, typically at least 4-6 weeks.   EXERCISES  Results after joint replacement surgery are often greatly improved when you follow the exercise, range of motion and muscle strengthening exercises prescribed by your doctor. Safety measures are also important to protect the joint from further injury. Any time any of these exercises cause you to have increased pain or swelling, decrease what you are doing until you are comfortable again and then slowly increase them. If you have problems or questions, call your caregiver or physical therapist for advice.   Rehabilitation is important following a joint replacement. After just a few days of immobilization, the muscles of the leg can become weakened and shrink (atrophy).  These exercises are designed to build up the tone and strength of the thigh and leg muscles and to improve motion. Often times heat used for twenty to thirty minutes before working out will loosen  up your tissues and help with improving the range of motion but do not use heat for the first two weeks following surgery (sometimes heat can increase post-operative swelling).   These exercises can be done on a training (exercise) mat, on the floor, on a table or on a bed. Use whatever works the best and is most comfortable for you.    Use music or television while you are exercising so that the exercises are a pleasant break in your day. This will make your life better with the exercises acting as a break in  your routine that you can look forward to.   Perform all exercises about fifteen times, three times per day or as directed.  You should exercise both the operative leg and the other leg as well.  Exercises include:    Quad Sets - Tighten up the muscle on the front of the thigh (Quad) and hold for 5-10 seconds.    Straight Leg Raises - With your knee straight (if you were given a brace, keep it on), lift the leg to 60 degrees, hold for 3 seconds, and slowly lower the leg.  Perform this exercise against resistance later as your leg gets stronger.   Leg Slides: Lying on your back, slowly slide your foot toward your buttocks, bending your knee up off the floor (only go as far as is comfortable). Then slowly slide your foot back down until your leg is flat on the floor again.   Angel Wings: Lying on your back spread your legs to the side as far apart as you can without causing discomfort.   Hamstring Strength:  Lying on your back, push your heel against the floor with your leg straight by tightening up the muscles of your buttocks.  Repeat, but this time bend your knee to a comfortable angle, and push your heel against the floor.  You may put a pillow under the heel to make it more comfortable if necessary.   A rehabilitation program following joint replacement surgery can speed recovery and prevent re-injury in the future due to weakened muscles. Contact your doctor or a physical therapist for more information on knee rehabilitation.    CONSTIPATION  Constipation is defined medically as fewer than three stools per week and severe constipation as less than one stool per week.  Even if you have a regular bowel pattern at home, your normal regimen is likely to be disrupted due to multiple reasons following surgery.  Combination of anesthesia, postoperative narcotics, change in appetite and fluid intake all can affect your bowels.   YOU MUST use at least one of the following options; they are listed in  order of increasing strength to get the job done.  They are all available over the counter, and you may need to use some, POSSIBLY even all of these options:    Drink plenty of fluids (prune juice may be helpful) and high fiber foods Colace 100 mg by mouth twice a day  Senokot for constipation as directed and as needed Dulcolax (bisacodyl), take with full glass of water  Miralax (polyethylene glycol) once or twice a day as needed.  If you have tried all these things and are unable to have a bowel movement in the first 3-4 days after surgery call either your surgeon or your primary doctor.    If you experience loose stools or diarrhea, hold the medications until you stool forms back up.  If your symptoms do not get better within 1  week or if they get worse, check with your doctor.  If you experience "the worst abdominal pain ever" or develop nausea or vomiting, please contact the office immediately for further recommendations for treatment.   ITCHING:  If you experience itching with your medications, try taking only a single pain pill, or even half a pain pill at a time.  You can also use Benadryl over the counter for itching or also to help with sleep.   TED HOSE STOCKINGS:  Use stockings on both legs until for at least 2 weeks or as directed by physician office. They may be removed at night for sleeping.  MEDICATIONS:  See your medication summary on the After Visit Summary that nursing will review with you.  You may have some home medications which will be placed on hold until you complete the course of blood thinner medication.  It is important for you to complete the blood thinner medication as prescribed.  PRECAUTIONS:  If you experience chest pain or shortness of breath - call 911 immediately for transfer to the hospital emergency department.   If you develop a fever greater that 101 F, purulent drainage from wound, increased redness or drainage from wound, foul odor from the  wound/dressing, or calf pain - CONTACT YOUR SURGEON.                                                   FOLLOW-UP APPOINTMENTS:  If you do not already have a post-op appointment, please call the office for an appointment to be seen by your surgeon.  Guidelines for how soon to be seen are listed in your After Visit Summary, but are typically between 1-4 weeks after surgery.  OTHER INSTRUCTIONS:   Knee Replacement:  Do not place pillow under knee, focus on keeping the knee straight while resting. CPM instructions: 0-90 degrees, 2 hours in the morning, 2 hours in the afternoon, and 2 hours in the evening. Place foam block, curve side up under heel at all times except when in CPM or when walking.  DO NOT modify, tear, cut, or change the foam block in any way.  MAKE SURE YOU:   Understand these instructions.   Get help right away if you are not doing well or get worse.    Thank you for letting us be a part of your medical care team.  It is a privilege we respect greatly.  We hope these instructions will help you stay on track for a fast and full recovery!   Information on my medicine - ELIQUIS (apixaban)  This medication education was reviewed with me or my healthcare representative as part of my discharge preparation.  The pharmacist that spoke with me during my hospital stay was:  Romona Curls, [facility]  Why was Eliquis prescribed for you? Eliquis was prescribed for you to reduce the risk of blood clots forming after orthopedic surgery.    What do You need to know about Eliquis? Take your Eliquis TWICE DAILY - one tablet in the morning and one tablet in the evening with or without food.  It would be best to take the dose about the same time each day.  If you have difficulty swallowing the tablet whole please discuss with your pharmacist how to take the medication safely.  Take Eliquis exactly as prescribed by your doctor  and DO NOT stop taking Eliquis without talking to the doctor who  prescribed the medication.  Stopping without other medication to take the place of Eliquis may increase your risk of developing a clot.  After discharge, you should have regular check-up appointments with your healthcare provider that is prescribing your Eliquis.  What do you do if you miss a dose? If a dose of ELIQUIS is not taken at the scheduled time, take it as soon as possible on the same day and twice-daily administration should be resumed.  The dose should not be doubled to make up for a missed dose.  Do not take more than one tablet of ELIQUIS at the same time.  Important Safety Information A possible side effect of Eliquis is bleeding. You should call your healthcare provider right away if you experience any of the following: ? Bleeding from an injury or your nose that does not stop. ? Unusual colored urine (red or dark brown) or unusual colored stools (red or black). ? Unusual bruising for unknown reasons. ? A serious fall or if you hit your head (even if there is no bleeding).  Some medicines may interact with Eliquis and might increase your risk of bleeding or clotting while on Eliquis. To help avoid this, consult your healthcare provider or pharmacist prior to using any new prescription or non-prescription medications, including herbals, vitamins, non-steroidal anti-inflammatory drugs (NSAIDs) and supplements.  This website has more information on Eliquis (apixaban): http://www.eliquis.com/eliquis/home

## 2015-05-30 NOTE — Discharge Summary (Addendum)
Patient ID: Wanda Curry MRN: [id number] DOB/AGE: Sep 19, 1945 69 y.o.  Admit date: 05/30/2015 Discharge date: 05/31/2015  Admission Diagnoses:  Active Problems:   DJD (degenerative joint disease) of knee   Discharge Diagnoses:  Same  Past Medical History  Diagnosis Date  . PONV (postoperative nausea and vomiting)   . Heart murmur     ECHO scheduled 05-25-2015  . Diabetes mellitus without complication ([place])   . Fibromyalgia   . Depression   . GERD (gastroesophageal reflux disease)   . Cancer ([place])     skin cancer  . Anemia   . Hyperlipidemia   . Arthritis     RA    Surgeries: Procedure(s): RIGHT TOTAL KNEE ARTHROPLASTY HARDWARE REMOVAL on 05/30/2015   Consultants:    Discharged Condition: Improved  Hospital Course: Wanda Curry is an 69 y.o. female who was admitted 05/30/2015 for operative treatment of primary localized osteoarthritis right knee. Patient has severe unremitting pain that affects sleep, daily activities, and work/hobbies. After pre-op clearance the patient was taken to the operating room on 05/30/2015 and underwent  Procedure(s): RIGHT TOTAL KNEE ARTHROPLASTY HARDWARE REMOVAL.  Patient with a  Pre-op Hb of 13.0 developed abla on pod #1 with a Hb of 10.9.  Patient is currently stable but we will continue to follow.  Patient was given perioperative antibiotics:      Anti-infectives    Start     Dose/Rate Route Frequency Ordered Stop   05/30/15 1600  ceFAZolin (ANCEF) IVPB 2 g/50 mL premix     2 g 100 mL/hr over 30 Minutes Intravenous Every 6 hours 05/30/15 1356 05/30/15 2116   05/30/15 1045  ceFAZolin (ANCEF) IVPB 2 g/50 mL premix     2 g 100 mL/hr over 30 Minutes Intravenous To ShortStay Surgical 05/29/15 1216 05/30/15 1006       Patient was given sequential compression devices, early ambulation, and chemoprophylaxis to prevent DVT.  Patient benefited maximally from hospital stay and there were no complications.    Recent vital signs:   Patient Vitals for the past 24 hrs:  BP Temp Temp src Pulse Resp SpO2 Height Weight  05/31/15 0451 (!) 125/58 mmHg 98.3 F (36.8 C) Oral 90 16 96 % - -  05/31/15 0023 (!) 112/43 mmHg 97.7 F (36.5 C) Oral 91 18 95 % - -  05/30/15 2025 (!) 119/41 mmHg 97.7 F (36.5 C) Oral 81 18 96 % - -  05/30/15 1406 (!) 137/95 mmHg 97.5 F (36.4 C) Oral 74 14 100 % - -  05/30/15 1345 (!) 126/53 mmHg 97.7 F (36.5 C) - 74 13 100 % - -  05/30/15 1330 (!) 119/46 mmHg - - 66 15 100 % - -  05/30/15 1315 (!) 124/49 mmHg - - 65 12 100 % - -  05/30/15 1300 (!) 127/48 mmHg - - 72 15 100 % - -  05/30/15 1245 (!) 127/56 mmHg - - 68 (!) 8 100 % - -  05/30/15 1237 (!) 146/56 mmHg 97 F (36.1 C) - 73 (!) 21 100 % - -  05/30/15 0934 (!) 126/56 mmHg 97.2 F (36.2 C) Oral 85 20 100 % 5' 5.5" (1.664 m) 109.317 kg (241 lb)     Recent laboratory studies:   Recent Labs  05/31/15 0544  WBC 13.5*  HGB 10.9*  HCT 34.0*  PLT 246     Discharge Medications:     Medication List    STOP taking these medications  aspirin EC 325 MG tablet     Biotin 10 MG Caps     Fish Oil 1200 MG Caps     glucosamine-chondroitin 500-400 MG tablet     HYDROcodone-acetaminophen 7.5-325 MG tablet  Commonly known as:  NORCO      TAKE these medications        apixaban 2.5 MG Tabs tablet  Commonly known as:  ELIQUIS  Take 1 tab po q12 hours x 14 days following surgery to prevent blood clots     atorvastatin 20 MG tablet  Commonly known as:  LIPITOR  Take 20 mg by mouth daily.     bisacodyl 5 MG EC tablet  Commonly known as:  DULCOLAX  Take 1 tablet (5 mg total) by mouth daily as needed for moderate constipation.     Calcium-Vitamin D 600-200 MG-UNIT tablet  Take 1 tablet by mouth 2 (two) times daily.     carvedilol 6.25 MG tablet  Commonly known as:  COREG  Take 6.25 mg by mouth 2 (two) times daily with a meal.     folic acid 1 MG tablet  Commonly known as:  FOLVITE  Take 1 mg by mouth daily.      gabapentin 600 MG tablet  Commonly known as:  NEURONTIN  Take 600 mg by mouth 3 (three) times daily.     hydrochlorothiazide 25 MG tablet  Commonly known as:  HYDRODIURIL  Take 25 mg by mouth daily.     insulin aspart 100 UNIT/ML injection  Commonly known as:  novoLOG  Inject 20-22 Units into the skin 2 (two) times daily. 22 units am and 20 units pm     insulin glargine 100 UNIT/ML injection  Commonly known as:  LANTUS  Inject 55 Units into the skin at bedtime.     methocarbamol 500 MG tablet  Commonly known as:  ROBAXIN  Take 1 tablet (500 mg total) by mouth 4 (four) times daily.     methotrexate 2.5 MG tablet  Commonly known as:  RHEUMATREX  Take 20 mg by mouth once a week. Caution:Chemotherapy. Protect from light.     omeprazole 40 MG capsule  Commonly known as:  PRILOSEC  Take 40 mg by mouth daily.     ondansetron 4 MG tablet  Commonly known as:  ZOFRAN  Take 1 tablet (4 mg total) by mouth every 8 (eight) hours as needed for nausea or vomiting.     oxyCODONE-acetaminophen 5-325 MG tablet  Commonly known as:  ROXICET  Take 1-2 tablets by mouth every 4 (four) hours as needed.     rOPINIRole 1 MG tablet  Commonly known as:  REQUIP  Take 1 mg by mouth 3 (three) times daily.     SAVELLA 100 MG Tabs tablet  Generic drug:  Milnacipran HCl  Take 100 mg by mouth 2 (two) times daily.        Diagnostic Studies: Dg Knee Right Port  05/30/2015  CLINICAL DATA:  Postop knee arthroplasty EXAM: PORTABLE RIGHT KNEE - 1-2 VIEW COMPARISON:  None. FINDINGS: Tricompartmental knee prosthesis with components in anticipated position. Extensive edematous change superior and inferior to the knee joint anteriorly postoperatively. Extensive multifocal punctate hyper attenuation in the lateral soft tissues adjacent to the proximal tibia possibly representing foreign body of uncertain origin. IMPRESSION: Anticipated postoperative appearance of knee prosthesis. Extensive soft tissue swelling.  Possible foreign body material. Electronically Signed   By: Skipper Cliche M.D.   On: 05/30/2015 13:26    Disposition:  Final discharge disposition not confirmed    Follow-up Information    Follow up with Ninetta Lights, MD. Schedule an appointment as soon as possible for a visit in 2 weeks.   Specialty:  Orthopedic Surgery   Contact information:   [address] [place] [address] [phone]        Signed: Fannie Knee 05/31/2015, 6:26 AM

## 2015-05-30 NOTE — Anesthesia Postprocedure Evaluation (Signed)
  Anesthesia Post-op Note  Patient: Wanda Curry  Procedure(s) Performed: Procedure(s): RIGHT TOTAL KNEE ARTHROPLASTY (Right) HARDWARE REMOVAL (Right)  Patient Location: PACU  Anesthesia Type:General  Level of Consciousness: awake  Airway and Oxygen Therapy: Patient Spontanous Breathing  Post-op Pain: mild  Post-op Assessment: Post-op Vital signs reviewed LLE Motor Response: Purposeful movement, Responds to commands LLE Sensation: Full sensation RLE Motor Response: Purposeful movement, Responds to commands RLE Sensation: Full sensation, Pain      Post-op Vital Signs: Reviewed  Last Vitals:  Filed Vitals:   05/30/15 1406  BP: 137/95  Pulse: 74  Temp: 36.4 C  Resp: 14    Complications: No apparent anesthesia complications

## 2015-05-30 NOTE — Transfer of Care (Signed)
Immediate Anesthesia Transfer of Care Note  Patient: Wanda Curry  Procedure(s) Performed: Procedure(s): RIGHT TOTAL KNEE ARTHROPLASTY (Right) HARDWARE REMOVAL (Right)  Patient Location: PACU  Anesthesia Type:General  Level of Consciousness: awake, alert  and oriented  Airway & Oxygen Therapy: Patient Spontanous Breathing and Patient connected to nasal cannula oxygen  Post-op Assessment: Report given to RN and Post -op Vital signs reviewed and stable  Post vital signs: Reviewed and stable  Last Vitals:  Filed Vitals:   05/30/15 1237  BP: 146/56  Pulse: 73  Temp: 36.1 C  Resp: 21    Complications: No apparent anesthesia complications

## 2015-05-30 NOTE — Anesthesia Preprocedure Evaluation (Addendum)
Anesthesia Evaluation  Patient identified by MRN, date of birth, ID band Patient awake    Reviewed: Allergy & Precautions, NPO status   Airway Mallampati: II  TM Distance: >3 FB Neck ROM: Full    Dental   Pulmonary former smoker,    breath sounds clear to auscultation       Cardiovascular negative cardio ROS   Rhythm:Regular Rate:Normal     Neuro/Psych    GI/Hepatic Neg liver ROS, GERD  ,  Endo/Other  diabetes  Renal/GU      Musculoskeletal   Abdominal   Peds  Hematology   Anesthesia Other Findings   Reproductive/Obstetrics                           Anesthesia Physical Anesthesia Plan  ASA: III  Anesthesia Plan: General   Post-op Pain Management:    Induction: Intravenous  Airway Management Planned: Oral ETT  Additional Equipment:   Intra-op Plan:   Post-operative Plan: Extubation in OR  Informed Consent: I have reviewed the patients History and Physical, chart, labs and discussed the procedure including the risks, benefits and alternatives for the proposed anesthesia with the patient or authorized representative who has indicated his/her understanding and acceptance.   Dental advisory given  Plan Discussed with: Anesthesiologist, Surgeon and CRNA  Anesthesia Plan Comments:        Anesthesia Quick Evaluation

## 2015-05-30 NOTE — Progress Notes (Signed)
Utilization review completed.  

## 2015-05-30 NOTE — Anesthesia Procedure Notes (Signed)
Procedure Name: LMA Insertion Date/Time: 05/30/2015 10:01 AM Performed by: [place] Pink Pre-anesthesia Checklist: Patient identified, Emergency Drugs available, Suction available, Patient being monitored and Timeout performed Patient Re-evaluated:Patient Re-evaluated prior to inductionOxygen Delivery Method: Circle system utilized Preoxygenation: Pre-oxygenation with 100% oxygen Intubation Type: IV induction LMA: LMA inserted LMA Size: 4.0 Number of attempts: 1 Placement Confirmation: positive ETCO2 and breath sounds checked- equal and bilateral Tube secured with: Tape Dental Injury: Teeth and Oropharynx as per pre-operative assessment

## 2015-05-31 ENCOUNTER — Encounter (HOSPITAL_COMMUNITY): Payer: Self-pay | Admitting: Orthopedic Surgery

## 2015-05-31 DIAGNOSIS — M1731 Unilateral post-traumatic osteoarthritis, right knee: Secondary | ICD-10-CM | POA: Diagnosis not present

## 2015-05-31 LAB — BASIC METABOLIC PANEL
Anion gap: 8 (ref 5–15)
BUN: 16 mg/dL (ref 6–20)
CO2: 30 mmol/L (ref 22–32)
Calcium: 8.2 mg/dL — ABNORMAL LOW (ref 8.9–10.3)
Chloride: 96 mmol/L — ABNORMAL LOW (ref 101–111)
Creatinine, Ser: 1.06 mg/dL — ABNORMAL HIGH (ref 0.44–1.00)
GFR calc Af Amer: 60 mL/min — ABNORMAL LOW (ref >60)
GFR calc non Af Amer: 52 mL/min — ABNORMAL LOW (ref >60)
Glucose, Bld: 241 mg/dL — ABNORMAL HIGH (ref 65–99)
Potassium: 4.7 mmol/L (ref 3.5–5.1)
Sodium: 134 mmol/L — ABNORMAL LOW (ref 135–145)

## 2015-05-31 LAB — CBC
HCT: 34 % — ABNORMAL LOW (ref 36.0–46.0)
Hemoglobin: 10.9 g/dL — ABNORMAL LOW (ref 12.0–15.0)
MCH: 28.4 pg (ref 26.0–34.0)
MCHC: 32.1 g/dL (ref 30.0–36.0)
MCV: 88.5 fL (ref 78.0–100.0)
Platelets: 246 10*3/uL (ref 150–400)
RBC: 3.84 MIL/uL — ABNORMAL LOW (ref 3.87–5.11)
RDW: 14.4 % (ref 11.5–15.5)
WBC: 13.5 10*3/uL — ABNORMAL HIGH (ref 4.0–10.5)

## 2015-05-31 LAB — GLUCOSE, CAPILLARY
Glucose-Capillary: 219 mg/dL — ABNORMAL HIGH (ref 65–99)
Glucose-Capillary: 159 mg/dL — ABNORMAL HIGH (ref 65–99)

## 2015-05-31 NOTE — Patient Instructions (Signed)
Ankle Pumps    Point toes down, then up. Repeat 10 times.  Do 2 sessions each day.     Knee Presses  Tighten top of left thigh. Hold for 3 seconds. Relax for 3 seconds. Repeat 10 times.  Repeat with other leg.  Do 2 sessions each day  Towel Squeezes: Next, put a towel between your knees and do knee presses while also squeezing the towel.  Hold for 3 seconds.  Relax for 3 seconds.  Repeat 10 times.  Do 2 sessions each day.            Heel Slides   Slide right heel along bed towards bottom. Hold for 3 seconds. Slide back to flat knee position. Repeat 10 times. Do 2 sessions each day.   Short Kicks   Place pillow or towel roll under knees. Keep your thigh on the roll and lift right foot until leg is straight.  Hold for 3 seconds.  Repeat 10 times.  Do 2 sessions each day.               Straight Leg Raise  Lie on your back with your "good" knee bent and foot flat.  Slowly lift right leg 6 inches off the bed.  It is important to keep your leg as straight as possible and your toes pointed up. Repeat 10 times.  Do 2 sessions each day.    Leg out to the side   Lie on your back.  Slide your right leg out to the side and then pull it back to the center.  Keep your toes pointed toward the ceiling.  Repeat 10 times.  Do 2 sessions each day.                 SITTING EXERCISES     Knee Bending    Sit on a firm seat, foot on towel or pillowcase. Bend your right knee by pulling your heel under the seat as far as possible.  Hold for 10 seconds.  Relax for 3 seconds.  Repeat 10 times.  Do 2 sessions each day.  Next: Do the same exercise as above and use your "good" leg to help slide your heel under the seat.  Keep your hips on the chair.  Hold for 10 seconds.  Relax for 3 seconds.   Repeat 10 times.  Do 2 sessions each day.                    Long Kicks   Sit on a firm seat and slowly kick your right foot up.  It is important  to get your knee straight.  Hold for 3 seconds.  Relax for 3 seconds.  Repeat 10 times.  Do 2 sessions each day.   Copyright  VHI. All rights reserved.

## 2015-05-31 NOTE — Progress Notes (Signed)
Physical Therapy Treatment Patient Details Name: Wanda Curry MRN: [id number] DOB: 1946-02-06 Today's Date: 05/31/2015    History of Present Illness Patient adm for elective right TKR.  PMH:  patient with tibial plateau fracture Right knee in 2002 and now with progressive degenerative arthritis; DM    PT Comments    Patient did better this pm and feel patient safe for d/c with daughter and family.  Patient still with some periods of distractibility and daughter reports this as normal.  Patient also slightly slowed in responding to commands - may be related to medication.  Encouraged family to guard patient closely over next 24 hours during mobility, but overall patient at min-guard to supervisor level during gait.    Follow Up Recommendations  Home health PT     Equipment Recommendations  None recommended by PT    Recommendations for Other Services       Precautions / Restrictions Precautions Precautions: Knee;Fall Restrictions Weight Bearing Restrictions: Yes RLE Weight Bearing: Weight bearing as tolerated    Mobility  Bed Mobility               General bed mobility comments: Pt found seated in recliner upon PT entering/exiting room  Transfers Overall transfer level: Needs assistance Equipment used: Rolling walker (2 wheeled) Transfers: Sit to/from Stand Sit to Stand: Supervision         General transfer comment: verbal cues for hand placement and technique   Ambulation/Gait Ambulation/Gait assistance: Min guard Ambulation Distance (Feet): 50 Feet Assistive device: Rolling walker (2 wheeled) Gait Pattern/deviations: Step-to pattern;Decreased stride length Gait velocity: decreased   General Gait Details: patient did better with gait this pm.  More fluid movement, no lightheadedness.  Patient did get distracted during gait and required verbal cues to attend to task.   Stairs Stairs: Yes Stairs assistance: Min assist Stair Management: No  rails;Forwards;With walker Number of Stairs: 1 General stair comments: cueing for sequencing  Wheelchair Mobility    Modified Rankin (Stroke Patients Only)       Balance Overall balance assessment: Needs assistance Sitting-balance support: No upper extremity supported;Feet supported Sitting balance-Leahy Scale: Good     Standing balance support: Bilateral upper extremity supported;During functional activity Standing balance-Leahy Scale: Fair Standing balance comment: no obvious balance concerns during gait.                    Cognition Arousal/Alertness: Awake/alert Behavior During Therapy: WFL for tasks assessed/performed Overall Cognitive Status: Impaired/Different from baseline Area of Impairment: Following commands;Awareness;Memory     Memory: Decreased short-term memory Following Commands: Follows one step commands with increased time   Awareness: Emergent        Exercises      General Comments        Pertinent Vitals/Pain Pain Assessment: 0-10 Pain Score: 3  Faces Pain Scale: Hurts little more Pain Location: right knee during exercise Pain Descriptors / Indicators: Aching Pain Intervention(s): Limited activity within patient's tolerance;Monitored during session;Premedicated before session    Home Living Family/patient expects to be discharged to:: Private residence Living Arrangements: Children Available Help at Discharge: Family;Available 24 hours/day Type of Home: House Home Access: Stairs to enter Entrance Stairs-Rails: None Home Layout: One level Home Equipment: Environmental consultant - 2 wheels;Bedside commode Additional Comments: Above is information on patient's daughters house. Plan is for patient to discharge there post acute.     Prior Function Level of Independence: Independent          PT Goals (current goals can  now be found in the care plan section) Acute Rehab PT Goals Patient Stated Goal: go home today Progress towards PT goals:  Progressing toward goals    Frequency  7X/week    PT Plan Current plan remains appropriate    Co-evaluation             End of Session Equipment Utilized During Treatment: Gait belt Activity Tolerance: No increased pain;Patient tolerated treatment well Patient left: in chair;with family/visitor present;with call bell/phone within reach     Time: 1405-1430 PT Time Calculation (min) (ACUTE ONLY): 25 min  Charges:  $Gait Training: 8-22 mins $Therapeutic Exercise: 8-22 mins                    G Codes:      Shanna Cisco June 16, 2015, 2:45 PM 06-16-15 [place], [place]

## 2015-05-31 NOTE — Progress Notes (Signed)
Subjective: 1 Day Post-Op Procedure(s) (LRB): RIGHT TOTAL KNEE ARTHROPLASTY (Right) HARDWARE REMOVAL (Right) Patient reports pain as mild.  No nausea/vomiting, lightheadedness/dizziness, chest pain/sob.  Negative flatus/bm.  Tolerating diet.  Objective: Vital signs in last 24 hours: Temp:  [97 F (36.1 C)-98.3 F (36.8 C)] 98.3 F (36.8 C) (10/13 0451) Pulse Rate:  [65-91] 90 (10/13 0451) Resp:  [8-21] 16 (10/13 0451) BP: (112-146)/(41-95) 125/58 mmHg (10/13 0451) SpO2:  [95 %-100 %] 96 % (10/13 0451) Weight:  [109.317 kg (241 lb)] 109.317 kg (241 lb) (10/12 0934)  Intake/Output from previous day: 10/12 0701 - 10/13 0700 In: 2356.7 [P.O.:920; I.V.:1336.7; IV Piggyback:100] Out: 50 [Blood:50] Intake/Output this shift:     Recent Labs  05/31/15 0544  HGB 10.9*    Recent Labs  05/31/15 0544  WBC 13.5*  RBC 3.84*  HCT 34.0*  PLT 246    Recent Labs  05/31/15 0544  NA 134*  K 4.7  CL 96*  CO2 30  BUN 16  CREATININE 1.06*  GLUCOSE 241*  CALCIUM 8.2*   No results for input(s): LABPT, INR in the last 72 hours.  Neurologically intact Neurovascular intact Sensation intact distally Intact pulses distally Dorsiflexion/Plantar flexion intact Incision: moderate drainage No cellulitis present Compartment soft  Dressing changed by me today  Assessment/Plan: 1 Day Post-Op Procedure(s) (LRB): RIGHT TOTAL KNEE ARTHROPLASTY (Right) HARDWARE REMOVAL (Right) Advance diet Up with therapy Discharge home with home health following second session of PT ABLA-mild and stable Dry dressing change prn WBAT RLE  Fannie Knee 05/31/2015, 7:09 AM

## 2015-05-31 NOTE — Care Management Note (Signed)
Case Management Note  Patient Details  Name: Wanda Curry MRN: [id number] Date of Birth: Dec 01, 1945  Subjective/Objective:        S/p right total knee arthroplasty            Action/Plan: Set up with [facility] for HHPT by MD office. Spoke with patient and her daughter, no change in discharge plan. Patient will be staying with her daughter in [place], [facility] already has daughter's address. Patient stated that [facility] has delivered a rolling walker and a CPM to her daughter's home, she already had a 3N1. Patient's daughter will be assisting her after discharge.      Expected Discharge Date:                  Expected Discharge Plan:  [place]  In-House Referral:  NA  Discharge planning Services  CM Consult  Post Acute Care Choice:  Durable Medical Equipment, Home Health Choice offered to:  Patient  DME Arranged:  CPM, Walker rolling DME Agency:  [employer]  HH Arranged:  PT HH Agency:  [facility]  Status of Service:  Completed, signed off  Medicare Important Message Given:    Date Medicare IM Given:    Medicare IM give by:    Date Additional Medicare IM Given:    Additional Medicare Important Message give by:     If discussed at [place] of Stay Meetings, dates discussed:    Additional Comments:  Nila Nephew, RN 05/31/2015, 10:28 AM

## 2015-05-31 NOTE — Evaluation (Signed)
Physical Therapy Evaluation Patient Details Name: Wanda Curry MRN: [id number] DOB: 04-19-1946 Today's Date: 05/31/2015   History of Present Illness  Patient adm for elective right TKR.  PMH:  patient with tibial plateau fracture Right knee in 2002 and now with progressive degenerative arthritis; DM  Clinical Impression  Patient did great for first time up with PT.  Limited by lightheadedness and some difficulty following commands for exercises.  Feel confident these will quickly resolve and patient will be able to discharge home today as planned.  Will benefit from PT to continue to progress gait and mobility and ensure independence for discharge.     Follow Up Recommendations Home health PT    Equipment Recommendations  None recommended by PT    Recommendations for Other Services       Precautions / Restrictions Precautions Precautions: Knee;Fall Restrictions Weight Bearing Restrictions: Yes RLE Weight Bearing: Weight bearing as tolerated      Mobility  Bed Mobility Overal bed mobility: Modified Independent             General bed mobility comments: used railing to come to EOB  Transfers Overall transfer level: Needs assistance Equipment used: Rolling walker (2 wheeled) Transfers: Sit to/from Stand Sit to Stand: Supervision         General transfer comment: verbal cues for hand placement  Ambulation/Gait Ambulation/Gait assistance: Supervision Ambulation Distance (Feet): 50 Feet Assistive device: Rolling walker (2 wheeled) Gait Pattern/deviations: Step-to pattern;Wide base of support;Decreased stride length Gait velocity: decreased   General Gait Details: Patient did well with gait, however when returning to room, complained of lightheadedness.  Sat patient in chair immediately and patient reported symptoms resolved.  Did not that patient with some difficulty following commands after that episode.    Stairs            Wheelchair Mobility     Modified Rankin (Stroke Patients Only)       Balance Overall balance assessment: No apparent balance deficits (not formally assessed)                                           Pertinent Vitals/Pain Pain Assessment: 0-10 Pain Score: 5  Pain Location: right knee Pain Descriptors / Indicators: Constant;Aching Pain Intervention(s): Limited activity within patient's tolerance;Monitored during session;Premedicated before session    Home Living Family/patient expects to be discharged to:: Private residence Living Arrangements: Children Available Help at Discharge: Family;Available 24 hours/day Type of Home: House Home Access: Stairs to enter Entrance Stairs-Rails: None Entrance Stairs-Number of Steps: 1 Home Layout: One level Home Equipment: Walker - 2 wheels;Bedside commode      Prior Function Level of Independence: Independent               Hand Dominance        Extremity/Trunk Assessment   Upper Extremity Assessment: Overall WFL for tasks assessed           Lower Extremity Assessment: RLE deficits/detail RLE Deficits / Details: limited knee flexion secondary to surgery, o/w WFL    Cervical / Trunk Assessment: Normal  Communication   Communication: No difficulties  Cognition Arousal/Alertness: Awake/alert Behavior During Therapy: WFL for tasks assessed/performed Overall Cognitive Status: Impaired/Different from baseline Area of Impairment: Following commands       Following Commands: Follows one step commands with increased time  General Comments      Exercises Total Joint Exercises Ankle Circles/Pumps: AROM;Both;10 reps;Seated Quad Sets: AROM;Both;10 reps;Seated Gluteal Sets: AROM;Both;10 reps;Seated Long Arc Quad: AROM;Right;10 reps;Seated Knee Flexion: AROM;10 reps;Right;Seated      Assessment/Plan    PT Assessment Patient needs continued PT services  PT Diagnosis Difficulty walking   PT Problem List  Decreased strength;Decreased range of motion;Decreased activity tolerance;Decreased mobility;Decreased knowledge of use of DME  PT Treatment Interventions DME instruction;Gait training;Stair training;Functional mobility training;Therapeutic activities;Therapeutic exercise;Patient/family education   PT Goals (Current goals can be found in the Care Plan section) Acute Rehab PT Goals Patient Stated Goal: go home today PT Goal Formulation: With patient/family Time For Goal Achievement: 06/02/15 Potential to Achieve Goals: Good    Frequency 7X/week   Barriers to discharge        Co-evaluation               End of Session Equipment Utilized During Treatment: Gait belt Activity Tolerance: No increased pain;Treatment limited secondary to medical complications (Comment) Patient left: in chair;with family/visitor present;with call bell/phone within reach           Time: 0912-0947 PT Time Calculation (min) (ACUTE ONLY): 35 min   Charges:   PT Evaluation $Initial PT Evaluation Tier I: 1 Procedure PT Treatments $Therapeutic Exercise: 8-22 mins   PT G CodesShanna Cisco 05/31/2015, 9:56 AM 05/31/2015 [place], [place]

## 2015-05-31 NOTE — Plan of Care (Signed)
Problem: Phase II Progression Outcomes Goal: Ambulates Outcome: Progressing Observed ambulating near bedside.  Problem: Phase III Progression Outcomes Goal: Pain controlled on oral analgesia Outcome: Completed/Met Date Met:  05/31/15 Not needing IV pain medications this shift . Pain ratings have been 2- 3 / 10 and po meds have + effects in pain relief.   Problem: Discharge Progression Outcomes Goal: Ambulates safely using assistive device Outcome: Progressing Daughter or staff standby assist completed

## 2015-05-31 NOTE — Op Note (Signed)
NAMEMarland Kitchen  DANEY, MOOR             ACCOUNT NO.:  [id number]  MEDICAL RECORD NO.:  [id number]  LOCATION:  [id number]                        FACILITY:  [facility]  PHYSICIAN:  Ninetta Lights, M.D. DATE OF BIRTH:  June 23, 1946  DATE OF PROCEDURE:  05/30/2015 DATE OF DISCHARGE:                              OPERATIVE REPORT   PREOPERATIVE DIAGNOSES: 1. Right knee end-stage arthritis, primary generalized. 2. Significant valgus alignment with bone loss at lateral compartment. 3. Status post previous open reduction and internal fixation of her     proximal tibia with an L-shaped lateral Synthes plate and screws. 4. Fracture healed.  POSTOPERATIVE DIAGNOSES: 1. Right knee end-stage arthritis, primary generalized. 2. Significant valgus alignment with bone loss at lateral compartment. 3. Status post previous open reduction and internal fixation of her     proximal tibia with an an L-shaped lateral Synthes plate and     screws. 4. Fracture healed.  PROCEDURE:  Right knee modified, minimally invasive total knee replacement utilizing Stryker triathlon prosthesis.  Soft tissue balancing including lateral retinacular release.  Cemented-pegged cruciate retaining #4 femoral component.  Cemented #5 tibial component, 11 mm CS insert.  Cemented resurfacing 35-mm patellar component. Removal of plate and screws from proximal tibia.  Release of the anterior compartment, left released after removal of hardware.  SURGEON:  Ninetta Lights, M.D.  ASSISTANT:  Elmyra Ricks, PA., present throughout the entire case and necessary for timely completion of procedure.  ANESTHESIA:  General.  BLOOD LOSS:  Minimal.  SPECIMENS:  None.  CULTURES:  None.  COMPLICATIONS:  None.  DRESSINGS:  Soft compressive knee immobilizer.  TOURNIQUET TIME:  One hour and 15 minutes.  DESCRIPTION OF PROCEDURE:  The patient was brought to the operating room, placed on the operating table in a supine position.   After adequate anesthesia had been obtained, tourniquet applied.  Prepped and draped in usual sterile fashion.  Exsanguinated with elevation of Esmarch.  Tourniquet inflated to 350 mmHg.  She had a fixed valgus of more than 15 degrees.  This is only a little bit correctable.  I marked a previous lateral incision.  I then made a longitudinal incision above the patella, down the tibial tubercle and extending distally.  The skin and subcutaneous tissue divided.  I then did a subperiosteal exposure of the tibia on the lateral side and completed a release of the anterior compartment distally to prevent postoperative compartment issues.  With subperiosteal exposure, I could then expose the plate and all the screws.  Utilizing a portion of her previous incision as well as a small other stab wound, I could place a screwdriver through those into the screws and then they were all sequentially removed.  The plate was then freed up and removed.  Once that was complete, attention turned to the knee.  Medial arthrotomy vastus splitting preserving quad tendon. Considerable bone loss lateral compartment, some on the femur, mostly on the tibia.  Exuberant spurs throughout.  An 8 mm resection, distal femur with flexible intramedullary guide, 5 degrees of valgus.  Using epicondylar axis, the femur was sized, cut, and fitted for a pegged #4 cruciate retaining component.  Extramedullary guide on the tibia.  A 3-  degree posterior slope cut.  I had to go a little lower than usual to get below the defect laterally.  Once that was completed, there were some other free fragments, posterolaterally they were removed.  I still had relatively reasonable bone stock throughout.  Patella exposed, posterior 10 mm removed.  Drilled, sized, and fitted for a 35-mm component.  Because of the hardware removal, when I prepared the tibia, I added 100 mm stem to protect when the screws had been removed.  Once everything was  cleaned out, trials were put in place, rotation of tibial component was set with trials.  With an 11 mm insert, I had good stability alignment, but because of the scarring laterally, I did have to do a lateral release to balance patellofemoral joint.  All trials removed.  The tibia was then prepared for the keel as well as for the 100 mm rod.  Copious irrigation with pulse irrigating device.  Cement prepared, placed on all components, firmly seated.  Polyethylene attached to the tibia and knee reduced.  Patella held with a clamp. Once cement hardened, the knee was injected with Exparel.  Copiously irrigated prior to that.  At completion, I was very pleased of __________ in flexion, extension, stability, alignment, and patellar tracking.  The anterior compartment was left open.  The stab wound and the lateral incision were closed primarily.  Arthrotomy closed with Vicryl and then a subcutaneous, subcuticular closure.  Margins were injected with Marcaine.  Sterile compressive dressing applied. Tourniquet deflated, removed.  Knee immobilizer applied.  Anesthesia reversed.  Brought to the recovery room.  Tolerated the surgery well. No complications.     Ninetta Lights, M.D.     DFM/MEDQ  D:  05/30/2015  T:  05/31/2015  Job:  [id number]

## 2015-05-31 NOTE — Evaluation (Signed)
Occupational Therapy Evaluation Patient Details Name: Anistyn Graddy MRN: [id number] DOB: June 28, 1946 Today's Date: 05/31/2015    History of Present Illness Patient adm for elective right TKR.  PMH:  patient with tibial plateau fracture Right knee in 2002 and now with progressive degenerative arthritis; DM   Clinical Impression   Patient presenting with decreased ADL and functional mobility independence secondary to above. Patient independent PTA. Patient currently functioning at an overall min guard to min assist level. Patient will benefit from acute OT to increase overall independence in the areas of ADLs, functional mobility, and overall safety in order to safely discharge to daughters house.     Follow Up Recommendations  No OT follow up;Supervision/Assistance - 24 hour    Equipment Recommendations  Other (comment) (LH sponge)    Recommendations for Other Services  None at this time    Precautions / Restrictions Precautions Precautions: Knee;Fall Restrictions Weight Bearing Restrictions: Yes RLE Weight Bearing: Weight bearing as tolerated    Mobility Bed Mobility Overal bed mobility: Modified Independent General bed mobility comments: Pt found seated in recliner upon OT entering/exiting room  Transfers Overall transfer level: Needs assistance Equipment used: Rolling walker (2 wheeled) Transfers: Sit to/from Stand Sit to Stand: Min guard General transfer comment: verbal cues for hand placement and technique     Balance Overall balance assessment: Needs assistance Sitting-balance support: No upper extremity supported;Feet supported Sitting balance-Leahy Scale: Good     Standing balance support: Bilateral upper extremity supported;During functional activity Standing balance-Leahy Scale: Fair    ADL Overall ADL's : Needs assistance/impaired Eating/Feeding: Set up;Sitting   Grooming: Supervision/safety;Standing;Wash/dry hands   Upper Body Bathing: Set  up;Sitting   Lower Body Bathing: Min guard;Sit to/from stand   Upper Body Dressing : Set up;Sitting   Lower Body Dressing: Min guard;Sit to/from stand   Toilet Transfer: Min guard;RW;BSC   Toileting- Water quality scientist and Hygiene: Sit to/from stand;Min Child psychotherapist Details (indicate cue type and reason): did not occur, but discussed tub/shower transfers with pt and pt's daughter  Functional mobility during ADLs: Min guard;Rolling walker General ADL Comments: Pt able to problem solve how to prop leg up on surface to increase independence with LB ADLs. Encouraged pt to purchase a LH sponge to assist with LB bathing. Pt ambulated into BR for toilet transfer using BSC over toilet seat. Discussed tub/shower transfer using shower seat with pt and pt's daughter. Pt will benefit from acute OT to practice tub/shower transfer and increase overall independence.     Pertinent Vitals/Pain Pain Assessment: Faces Pain Score: 5  Faces Pain Scale: Hurts little more Pain Location: right knee with mobility  Pain Descriptors / Indicators: Guarding Pain Intervention(s): Limited activity within patient's tolerance;Monitored during session;Repositioned     Hand Dominance Right   Extremity/Trunk Assessment Upper Extremity Assessment Upper Extremity Assessment: Overall WFL for tasks assessed   Lower Extremity Assessment Lower Extremity Assessment: Defer to PT evaluation RLE Deficits / Details: limited knee flexion secondary to surgery, o/w WFL   Cervical / Trunk Assessment Cervical / Trunk Assessment: Normal   Communication Communication Communication: No difficulties   Cognition Arousal/Alertness: Awake/alert Behavior During Therapy: WFL for tasks assessed/performed Overall Cognitive Status: Impaired/Different from baseline Area of Impairment: Following commands;Awareness;Memory     Memory: Decreased short-term memory Following Commands: Follows one step commands with  increased time   Awareness: Emergent             Home Living Family/patient expects to be discharged to:: Private  residence Living Arrangements: Children Available Help at Discharge: Family;Available 24 hours/day Type of Home: House Home Access: Stairs to enter [employer] of Steps: 1 Entrance Stairs-Rails: None Home Layout: One level     Bathroom Shower/Tub: Tub/shower unit;Curtain   Bathroom Toilet: Handicapped height     Home Equipment: Environmental consultant - 2 wheels;Bedside commode   Additional Comments: Above is information on patient's daughters house. Plan is for patient to discharge there post acute.       Prior Functioning/Environment Level of Independence: Independent     OT Diagnosis: Generalized weakness;Acute pain   OT Problem List: Decreased strength;Decreased range of motion;Decreased activity tolerance;Impaired balance (sitting and/or standing);Decreased safety awareness;Decreased knowledge of use of DME or AE;Pain   OT Treatment/Interventions: Self-care/ADL training;Therapeutic exercise;Energy conservation;DME and/or AE instruction;Therapeutic activities;Patient/family education;Balance training    OT Goals(Current goals can be found in the care plan section) Acute Rehab OT Goals Patient Stated Goal: go home today OT Goal Formulation: With patient/family Time For Goal Achievement: 06/14/15 Potential to Achieve Goals: Good ADL Goals Pt Will Perform Grooming: with modified independence;standing Pt Will Perform Lower Body Bathing: with modified independence;sit to/from stand (using AE prn) Pt Will Perform Lower Body Dressing: with modified independence;sit to/from stand (using AE prn) Pt Will Transfer to Toilet: with modified independence;ambulating;bedside commode Pt Will Perform Tub/Shower Transfer: Tub transfer;ambulating;shower seat;rolling walker;with modified independence Additional ADL Goal #1: Pt will be mod I using RW prn for functional mobility    OT Frequency: Min 2X/week   Barriers to D/C: None known at this time   End of Session Equipment Utilized During Treatment: Rolling walker CPM Right Knee CPM Right Knee: Off  Activity Tolerance: Patient tolerated treatment well Patient left: in chair;with call bell/phone within reach;with family/visitor present   Time: 3557-3220 OT Time Calculation (min): 24 min Charges:  OT General Charges $OT Visit: 1 Procedure OT Evaluation $Initial OT Evaluation Tier I: 1 Procedure OT Treatments $Self Care/Home Management : 8-22 mins  Juell Radney , MS, OTR/L, CLT Pager: [phone]  05/31/2015, 12:40 PM

## 2016-07-31 IMAGING — CR DG KNEE 1-2V PORT*R*
2 series · 2 of 2 positions shown · non-contrast
Comparison: None.

CLINICAL DATA: Postop knee arthroplasty

EXAM:
PORTABLE RIGHT KNEE - 1-2 VIEW

[AP]
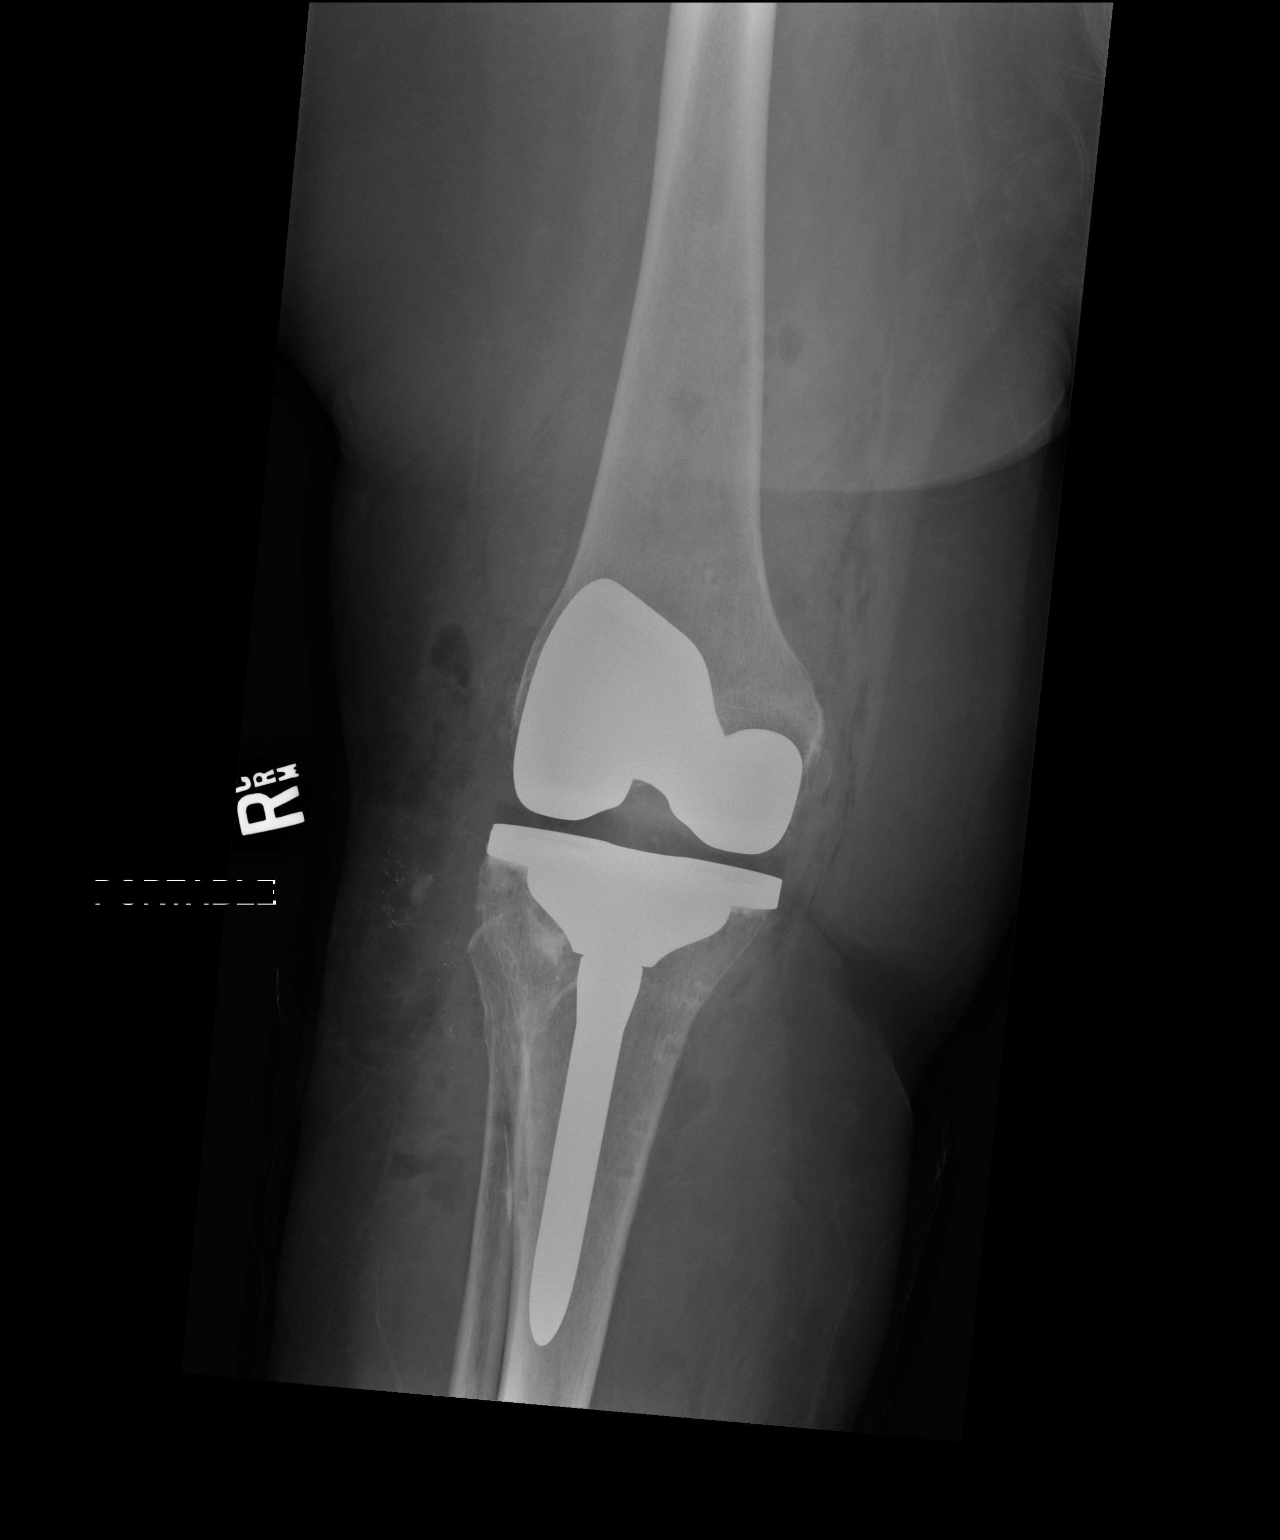

[xtable lateral]
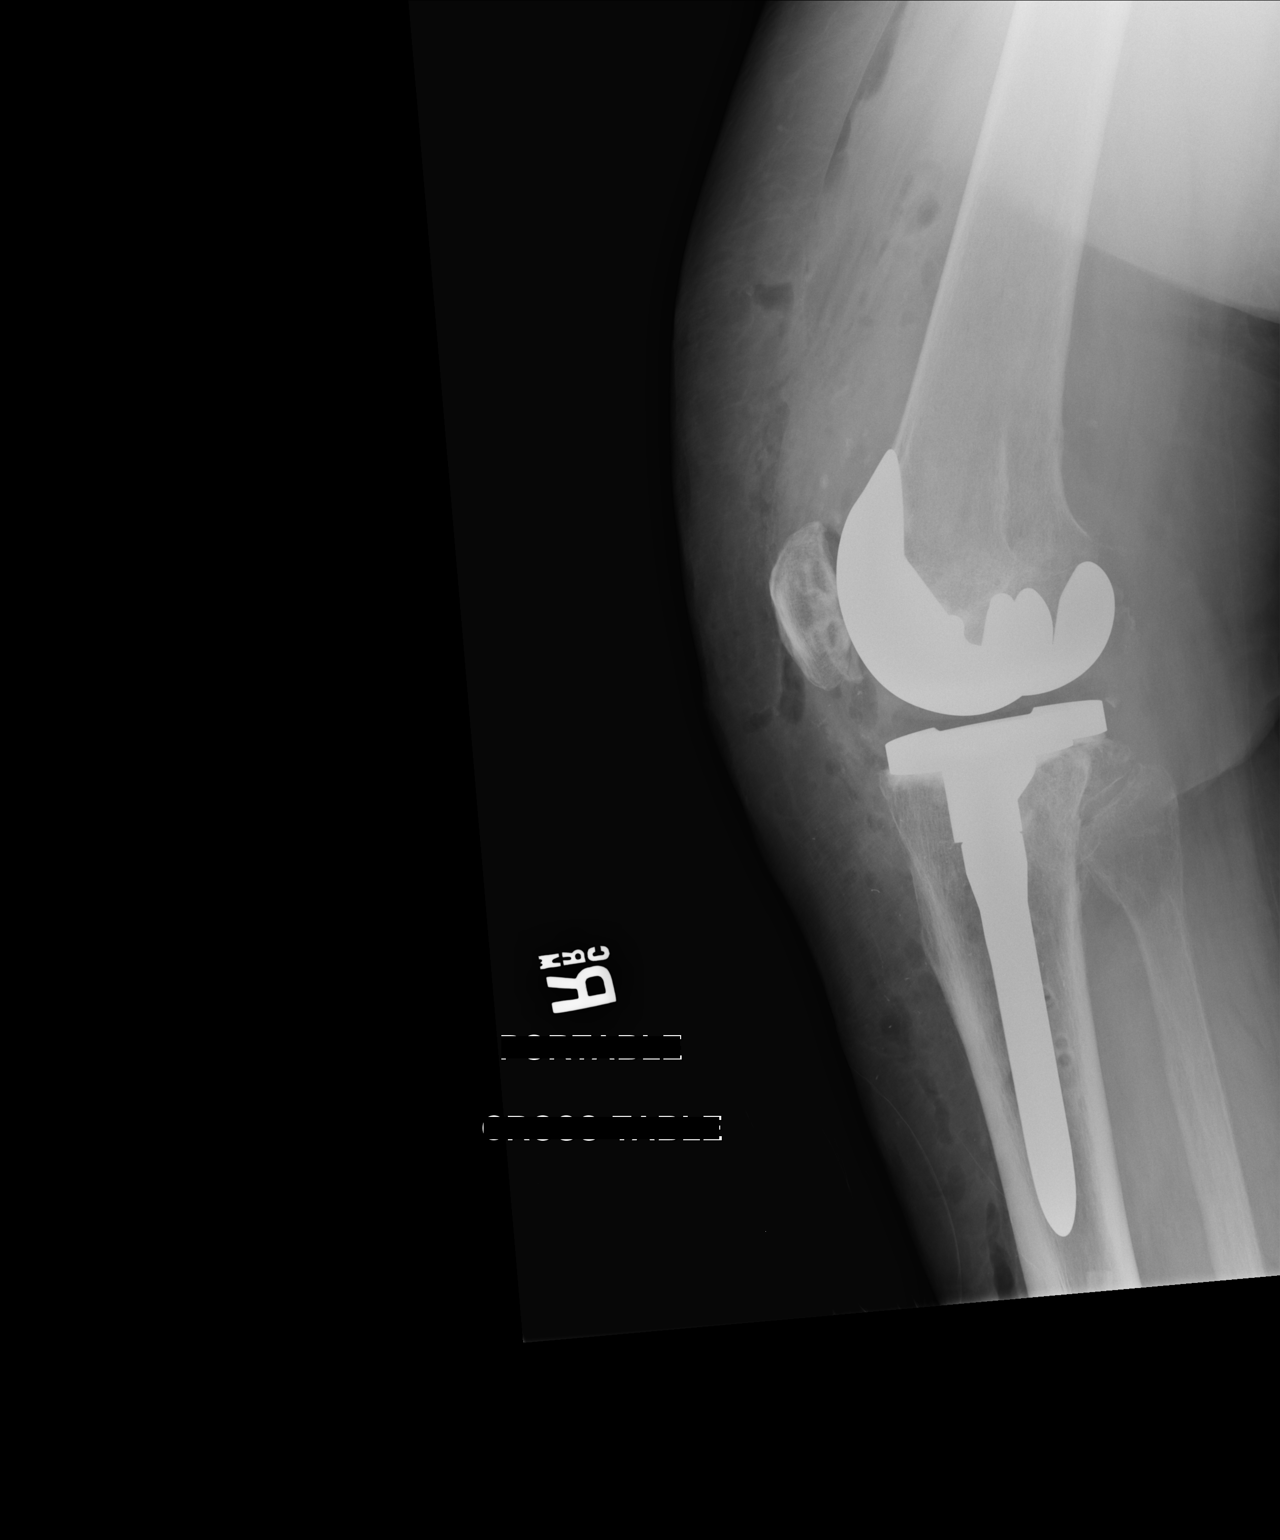

[2 of 2 positions shown; findings below may reference images not displayed]

FINDINGS: Tricompartmental knee prosthesis with components in anticipated
position. Extensive edematous change superior and inferior to the
knee joint anteriorly postoperatively. Extensive multifocal punctate
hyper attenuation in the lateral soft tissues adjacent to the
proximal tibia possibly representing foreign body of uncertain
origin.
IMPRESSION: Anticipated postoperative appearance of knee prosthesis. Extensive
soft tissue swelling. Possible foreign body material.

## 2016-08-04 ENCOUNTER — Encounter: Payer: Self-pay | Admitting: Gastroenterology

## 2016-08-25 ENCOUNTER — Ambulatory Visit: Payer: Medicare Other | Admitting: Gastroenterology

## 2016-09-10 ENCOUNTER — Encounter: Payer: Self-pay | Admitting: Nurse Practitioner

## 2016-09-10 ENCOUNTER — Ambulatory Visit (INDEPENDENT_AMBULATORY_CARE_PROVIDER_SITE_OTHER): Payer: Medicare Other | Admitting: Nurse Practitioner

## 2016-09-10 ENCOUNTER — Other Ambulatory Visit: Payer: Self-pay

## 2016-09-10 DIAGNOSIS — R195 Other fecal abnormalities: Secondary | ICD-10-CM

## 2016-09-10 DIAGNOSIS — K5900 Constipation, unspecified: Secondary | ICD-10-CM | POA: Diagnosis not present

## 2016-09-10 NOTE — Progress Notes (Addendum)
REVIEWED-NO ADDITIONAL RECOMMENDATIONS.  Primary Care Physician:  Earney Mallet, MD Primary Gastroenterologist:  Dr. Oneida Alar  Chief Complaint  Patient presents with  . Blood In Stools    HPI:   Wanda Curry is a 71 y.o. female who presents On referral from primary care for positive Hemoccult. PCP notes reviewed. The patient denied overt hematochezia or melena. Has never had a colonoscopy before.  Today she states she's doing well overall. Has had some issues with regulating blood sugars lately. She denies abdominal pain, N/V, hematochezia, melena, fever, chills, unintentional weight loss, changes in bowel habits. She does have rare/intermittent constipation. Rare/occasional hemorrhoid symptoms including fullness/rectal pain. Denies chest pain, dyspnea, dizziness, lightheadedness. Has had 6 episodes of syncope historically associated with hypoglycemia. Denies any other upper or lower GI symptoms.  She has never had a colonoscopy before. She has avoided it because of her diabetes.  Past Medical History:  Diagnosis Date  . Anemia   . Arthritis    RA  . Cancer ([place])    skin cancer  . Depression   . Diabetes mellitus without complication ([place])   . Fibromyalgia   . GERD (gastroesophageal reflux disease)   . Heart murmur    ECHO scheduled 05-25-2015  . Hyperlipidemia   . PONV (postoperative nausea and vomiting)     Past Surgical History:  Procedure Laterality Date  . ABDOMINAL HYSTERECTOMY    . CHOLECYSTECTOMY    . HARDWARE REMOVAL Right 05/30/2015   Procedure: HARDWARE REMOVAL;  Surgeon: Ninetta Lights, MD;  Location: [facility];  Service: Orthopedics;  Laterality: Right;  . planter warts Bilateral    both feet  . skin grafts     due to planter warts  . TOTAL KNEE ARTHROPLASTY Right 05/30/2015   Procedure: RIGHT TOTAL KNEE ARTHROPLASTY;  Surgeon: Ninetta Lights, MD;  Location: [facility];  Service: Orthopedics;  Laterality: Right;    Current Outpatient Prescriptions    Medication Sig Dispense Refill  . atorvastatin (LIPITOR) 20 MG tablet Take 20 mg by mouth daily.    . Calcium-Vitamin D 600-200 MG-UNIT tablet Take 1 tablet by mouth 2 (two) times daily.    . carvedilol (COREG) 6.25 MG tablet Take 6.25 mg by mouth 2 (two) times daily with a meal.    . folic acid (FOLVITE) 1 MG tablet Take 1 mg by mouth daily.    Marland Kitchen gabapentin (NEURONTIN) 600 MG tablet Take 600 mg by mouth 3 (three) times daily.    . hydrochlorothiazide (HYDRODIURIL) 25 MG tablet Take 25 mg by mouth daily.    . insulin glargine (LANTUS) 100 UNIT/ML injection Inject 45 Units into the skin at bedtime.     . insulin NPH-regular Human (NOVOLIN 70/30) (70-30) 100 UNIT/ML injection Inject 30 Units into the skin 2 (two) times daily with a meal.    . methotrexate (RHEUMATREX) 2.5 MG tablet Take 20 mg by mouth once a week. Caution:Chemotherapy. Protect from light.    Marland Kitchen omeprazole (PRILOSEC) 40 MG capsule Take 40 mg by mouth daily.    Marland Kitchen oxyCODONE-acetaminophen (ROXICET) 5-325 MG tablet Take 1-2 tablets by mouth every 4 (four) hours as needed. 60 tablet 0  . rOPINIRole (REQUIP) 1 MG tablet Take 1 mg by mouth 3 (three) times daily.    . methocarbamol (ROBAXIN) 500 MG tablet Take 1 tablet (500 mg total) by mouth 4 (four) times daily. (Patient not taking: Reported on 09/10/2016) 90 tablet 0  . Milnacipran HCl (SAVELLA) 100 MG TABS tablet Take 100 mg by  mouth 2 (two) times daily.     No current facility-administered medications for this visit.     Allergies as of 09/10/2016 - Review Complete 09/10/2016  Allergen Reaction Noted  . Adhesive [tape]  05/16/2015  . Sulfa antibiotics Nausea Only and Other (See Comments) 05/30/2015  . Erythromycin Itching and Rash 05/16/2015    Family History  Problem Relation Age of Onset  . Colon cancer Neg Hx     Social History   Social History  . Marital status: Widowed    Spouse name: N/A  . Number of children: N/A  . Years of education: N/A   Occupational  History  . Not on file.   Social History Main Topics  . Smoking status: Former Smoker    Packs/day: 1.00    Years: 40.00    Types: Cigarettes    Quit date: 08/18/2004  . Smokeless tobacco: Never Used  . Alcohol use No  . Drug use: No  . Sexual activity: Not on file   Other Topics Concern  . Not on file   Social History Narrative  . No narrative on file    Review of Systems: Complete ROS negative except as per HPI.    Physical Exam: BP 135/78   Pulse (!) 107   Temp 97.5 F (36.4 C) (Oral)   Ht 5' 5.5" (1.664 m)   Wt 254 lb (115.2 kg)   BMI 41.62 kg/m  General:   Morbidly obese female. Alert and oriented. Pleasant and cooperative. Well-nourished and well-developed.  Head:  Normocephalic and atraumatic. Eyes:  Without icterus, sclera clear and conjunctiva pink.  Ears:  Normal auditory acuity. Cardiovascular:  S1, S2 present without murmurs appreciated. Extremities without clubbing or edema. Respiratory:  Clear to auscultation bilaterally. No wheezes, rales, or rhonchi. No distress.  Gastrointestinal:  +BS, lare and rounded but soft, non-tender and non-distended. No HSM noted. No guarding or rebound. No masses appreciated.  Rectal:  Deferred  Musculoskalatal:  Symmetrical without gross deformities. Neurologic:  Alert and oriented x4;  grossly normal neurologically. Psych:  Alert and cooperative. Normal mood and affect. Heme/Lymph/Immune: No excessive bruising noted.    09/10/2016 2:50 PM   Disclaimer: This note was dictated with voice recognition software. Similar sounding words can inadvertently be transcribed and may not be corrected upon review.

## 2016-09-10 NOTE — Assessment & Plan Note (Signed)
She describes intermittent/rare constipation. She does have hemorrhoid symptoms as noted per history of present illness. This could be the source of her heme positive stool. Colonoscopy as per below. Consider constipation management such as MiraLAX if colonoscopy is normal and benign anorectal source is likely etiology of her bleeding, to help prevent constipation

## 2016-09-10 NOTE — Assessment & Plan Note (Signed)
Heme positive stool at primary care office. The patient is never had a colonoscopy before because she is diabetic and has had 6 episodes over the course of her lifetime of syncope related to hypoglycemia and she is worried about this. She describes rare/intermittent hemorrhoid symptoms but denies obvious hematochezia or melena. Likely benign anorectal source, although she will need a colonoscopy to rule out more insidious pathology. It is concerning at age 71 she is never had a colonoscopy before. We will proceed with her colonoscopy.  Proceed with colonoscopy with Dr. Oneida Alar in the near future. The risks, benefits, and alternatives have been discussed in detail with the patient. They state understanding and desire to proceed.   The patient is on Neurontin 600 mg 3 times a day, Robaxin, and oxycodone every 4 hours as needed. She does have chronic pain and fibromyalgia. We will plan for the procedure on propofol/MAC to promote adequate sedation.

## 2016-09-10 NOTE — Patient Instructions (Signed)
1. We will schedule your procedure for you. 2. Further recommendations to be made based on the results of your procedure. 3. Return for follow-up based on recommendations made after your procedure.

## 2016-09-11 ENCOUNTER — Telehealth: Payer: Self-pay

## 2016-09-11 NOTE — Telephone Encounter (Signed)
Called pt and informed of pre-op appt 09/17/16 at 12:45pm. Letter also mailed.

## 2016-09-11 NOTE — Progress Notes (Signed)
CC'D TO PCP °

## 2016-09-16 NOTE — Patient Instructions (Signed)
Wanda Curry  09/16/2016     @PREFPERIOPPHARMACY @   Your procedure is scheduled on  09/23/2016   Report to [facility] at  615  A.M.  Call this number if you have problems the morning of surgery:  [phone]   Remember:  Do not eat food or drink liquids after midnight.  Take these medicines the morning of surgery with A SIP OF WATER  Coreg, neurontin, hydrocodone, prilosec, requip, savella. Take 1/2 of your usual insulin dosage the night before your procedure. DO NOT take any diabetic medicines the morning of your procedure.   Do not wear jewelry, make-up or nail polish.  Do not wear lotions, powders, or perfumes, or deoderant.  Do not shave 48 hours prior to surgery.  Men may shave face and neck.  Do not bring valuables to the hospital.  [facility] is not responsible for any belongings or valuables.  Contacts, dentures or bridgework may not be worn into surgery.  Leave your suitcase in the car.  After surgery it may be brought to your room.  For patients admitted to the hospital, discharge time will be determined by your treatment team.  Patients discharged the day of surgery will not be allowed to drive home.   Name and phone number of your driver:   family Special instructions:  Follow the diet and prep instructions given to you by Dr Oneida Alar office.  Please read over the following fact sheets that you were given. Anesthesia Post-op Instructions and Care and Recovery After Surgery       Colonoscopy, Adult A colonoscopy is an exam to look at the entire large intestine. During the exam, a lubricated, bendable tube is inserted into the anus and then passed into the rectum, colon, and other parts of the large intestine. A colonoscopy is often done as a part of normal colorectal screening or in response to certain symptoms, such as anemia, persistent diarrhea, abdominal pain, and blood in the stool. The exam can help screen for and diagnose medical problems,  including:  Tumors.  Polyps.  Inflammation.  Areas of bleeding. Tell a health care provider about:  Any allergies you have.  All medicines you are taking, including vitamins, herbs, eye drops, creams, and over-the-counter medicines.  Any problems you or family members have had with anesthetic medicines.  Any blood disorders you have.  Any surgeries you have had.  Any medical conditions you have.  Any problems you have had passing stool. What are the risks? Generally, this is a safe procedure. However, problems may occur, including:  Bleeding.  A tear in the intestine.  A reaction to medicines given during the exam.  Infection (rare). What happens before the procedure? Eating and drinking restrictions  Follow instructions from your health care provider about eating and drinking, which may include:  A few days before the procedure - follow a low-fiber diet. Avoid nuts, seeds, dried fruit, raw fruits, and vegetables.  1-3 days before the procedure - follow a clear liquid diet. Drink only clear liquids, such as clear broth or bouillon, black coffee or tea, clear juice, clear soft drinks or sports drinks, gelatin desert, and popsicles. Avoid any liquids that contain red or purple dye.  On the day of the procedure - do not eat or drink anything during the 2 hours before the procedure, or within the time period that your health care provider recommends. Bowel prep  If you were prescribed an  oral bowel prep to clean out your colon:  Take it as told by your health care provider. Starting the day before your procedure, you will need to drink a large amount of medicated liquid. The liquid will cause you to have multiple loose stools until your stool is almost clear or light green.  If your skin or anus gets irritated from diarrhea, you may use these to relieve the irritation:  Medicated wipes, such as adult wet wipes with aloe and vitamin E.  A skin soothing-product like  petroleum jelly.  If you vomit while drinking the bowel prep, take a break for up to 60 minutes and then begin the bowel prep again. If vomiting continues and you cannot take the bowel prep without vomiting, call your health care provider. General instructions  Ask your health care provider about changing or stopping your regular medicines. This is especially important if you are taking diabetes medicines or blood thinners.  Plan to have someone take you home from the hospital or clinic. What happens during the procedure?  An IV tube may be inserted into one of your veins.  You will be given medicine to help you relax (sedative).  To reduce your risk of infection:  Your health care team will wash or sanitize their hands.  Your anal area will be washed with soap.  You will be asked to lie on your side with your knees bent.  Your health care provider will lubricate a long, thin, flexible tube. The tube will have a camera and a light on the end.  The tube will be inserted into your anus.  The tube will be gently eased through your rectum and colon.  Air will be delivered into your colon to keep it open. You may feel some pressure or cramping.  The camera will be used to take images during the procedure.  A small tissue sample may be removed from your body to be examined under a microscope (biopsy). If any potential problems are found, the tissue will be sent to a lab for testing.  If small polyps are found, your health care provider may remove them and have them checked for cancer cells.  The tube that was inserted into your anus will be slowly removed. The procedure may vary among health care providers and hospitals. What happens after the procedure?  Your blood pressure, heart rate, breathing rate, and blood oxygen level will be monitored until the medicines you were given have worn off.  Do not drive for 24 hours after the exam.  You may have a small amount of blood in  your stool.  You may pass gas and have mild abdominal cramping or bloating due to the air that was used to inflate your colon during the exam.  It is up to you to get the results of your procedure. Ask your health care provider, or the department performing the procedure, when your results will be ready. This information is not intended to replace advice given to you by your health care provider. Make sure you discuss any questions you have with your health care provider. Document Released: 08/01/2000 Document Revised: 02/22/2016 Document Reviewed: 10/16/2015 Elsevier Interactive Patient Education  2017 [employer]  Colonoscopy, Adult, Care After This sheet gives you information about how to care for yourself after your procedure. Your health care provider may also give you more specific instructions. If you have problems or questions, contact your health care provider. What can I expect after the procedure? After  the procedure, it is common to have:  A small amount of blood in your stool for 24 hours after the procedure.  Some gas.  Mild abdominal cramping or bloating. Follow these instructions at home: General instructions  For the first 24 hours after the procedure:  Do not drive or use machinery.  Do not sign important documents.  Do not drink alcohol.  Do your regular daily activities at a slower pace than normal.  Eat soft, easy-to-digest foods.  Rest often.  Take over-the-counter or prescription medicines only as told by your health care provider.  It is up to you to get the results of your procedure. Ask your health care provider, or the department performing the procedure, when your results will be ready. Relieving cramping and bloating  Try walking around when you have cramps or feel bloated.  Apply heat to your abdomen as told by your health care provider. Use a heat source that your health care provider recommends, such as a moist heat pack or a heating  pad.  Place a towel between your skin and the heat source.  Leave the heat on for 20-30 minutes.  Remove the heat if your skin turns bright red. This is especially important if you are unable to feel pain, heat, or cold. You may have a greater risk of getting burned. Eating and drinking  Drink enough fluid to keep your urine clear or pale yellow.  Resume your normal diet as instructed by your health care provider. Avoid heavy or fried foods that are hard to digest.  Avoid drinking alcohol for as long as instructed by your health care provider. Contact a health care provider if:  You have blood in your stool 2-3 days after the procedure. Get help right away if:  You have more than a small spotting of blood in your stool.  You pass large blood clots in your stool.  Your abdomen is swollen.  You have nausea or vomiting.  You have a fever.  You have increasing abdominal pain that is not relieved with medicine. This information is not intended to replace advice given to you by your health care provider. Make sure you discuss any questions you have with your health care provider. Document Released: 03/18/2004 Document Revised: 04/28/2016 Document Reviewed: 10/16/2015 Elsevier Interactive Patient Education  2017 [place] Anesthesia is a term that refers to techniques, procedures, and medicines that help a person stay safe and comfortable during a medical procedure. Monitored anesthesia care, or sedation, is one type of anesthesia. Your anesthesia specialist may recommend sedation if you will be having a procedure that does not require you to be unconscious, such as:  Cataract surgery.  A dental procedure.  A biopsy.  A colonoscopy. During the procedure, you may receive a medicine to help you relax (sedative). There are three levels of sedation:  Mild sedation. At this level, you may feel awake and relaxed. You will be able to follow  directions.  Moderate sedation. At this level, you will be sleepy. You may not remember the procedure.  Deep sedation. At this level, you will be asleep. You will not remember the procedure. The more medicine you are given, the deeper your level of sedation will be. Depending on how you respond to the procedure, the anesthesia specialist may change your level of sedation or the type of anesthesia to fit your needs. An anesthesia specialist will monitor you closely during the procedure. Let your health care provider know  about:  Any allergies you have.  All medicines you are taking, including vitamins, herbs, eye drops, creams, and over-the-counter medicines.  Any use of steroids (by mouth or as a cream).  Any problems you or family members have had with sedatives and anesthetic medicines.  Any blood disorders you have.  Any surgeries you have had.  Any medical conditions you have, such as sleep apnea.  Whether you are pregnant or may be pregnant.  Any use of cigarettes, alcohol, or street drugs. What are the risks? Generally, this is a safe procedure. However, problems may occur, including:  Getting too much medicine (oversedation).  Nausea.  Allergic reaction to medicines.  Trouble breathing. If this happens, a breathing tube may be used to help with breathing. It will be removed when you are awake and breathing on your own.  Heart trouble.  Lung trouble. Before the procedure Staying hydrated  Follow instructions from your health care provider about hydration, which may include:  Up to 2 hours before the procedure - you may continue to drink clear liquids, such as water, clear fruit juice, black coffee, and plain tea. Eating and drinking restrictions  Follow instructions from your health care provider about eating and drinking, which may include:  8 hours before the procedure - stop eating heavy meals or foods such as meat, fried foods, or fatty foods.  6 hours  before the procedure - stop eating light meals or foods, such as toast or cereal.  6 hours before the procedure - stop drinking milk or drinks that contain milk.  2 hours before the procedure - stop drinking clear liquids. Medicines  Ask your health care provider about:  Changing or stopping your regular medicines. This is especially important if you are taking diabetes medicines or blood thinners.  Taking medicines such as aspirin and ibuprofen. These medicines can thin your blood. Do not take these medicines before your procedure if your health care provider instructs you not to. Tests and exams  You will have a physical exam.  You may have blood tests done to show:  How well your kidneys and liver are working.  How well your blood can clot.  General instructions  Plan to have someone take you home from the hospital or clinic.  If you will be going home right after the procedure, plan to have someone with you for 24 hours. What happens during the procedure?  Your blood pressure, heart rate, breathing, level of pain and overall condition will be monitored.  An IV tube will be inserted into one of your veins.  Your anesthesia specialist will give you medicines as needed to keep you comfortable during the procedure. This may mean changing the level of sedation.  The procedure will be performed. After the procedure  Your blood pressure, heart rate, breathing rate, and blood oxygen level will be monitored until the medicines you were given have worn off.  Do not drive for 24 hours if you received a sedative.  You may:  Feel sleepy, clumsy, or nauseous.  Feel forgetful about what happened after the procedure.  Have a sore throat if you had a breathing tube during the procedure.  Vomit. This information is not intended to replace advice given to you by your health care provider. Make sure you discuss any questions you have with your health care provider. Document  Released: 04/30/2005 Document Revised: 01/11/2016 Document Reviewed: 11/25/2015 Elsevier Interactive Patient Education  2017 [place], Care After These instructions  provide you with information about caring for yourself after your procedure. Your health care provider may also give you more specific instructions. Your treatment has been planned according to current medical practices, but problems sometimes occur. Call your health care provider if you have any problems or questions after your procedure. What can I expect after the procedure? After your procedure, it is common to:  Feel sleepy for several hours.  Feel clumsy and have poor balance for several hours.  Feel forgetful about what happened after the procedure.  Have poor judgment for several hours.  Feel nauseous or vomit.  Have a sore throat if you had a breathing tube during the procedure. Follow these instructions at home: For at least 24 hours after the procedure:   Do not:  Participate in activities in which you could fall or become injured.  Drive.  Use heavy machinery.  Drink alcohol.  Take sleeping pills or medicines that cause drowsiness.  Make important decisions or sign legal documents.  Take care of children on your own.  Rest. Eating and drinking  Follow the diet that is recommended by your health care provider.  If you vomit, drink water, juice, or soup when you can drink without vomiting.  Make sure you have little or no nausea before eating solid foods. General instructions  Have a responsible adult stay with you until you are awake and alert.  Take over-the-counter and prescription medicines only as told by your health care provider.  If you smoke, do not smoke without supervision.  Keep all follow-up visits as told by your health care provider. This is important. Contact a health care provider if:  You keep feeling nauseous or you keep vomiting.  You  feel light-headed.  You develop a rash.  You have a fever. Get help right away if:  You have trouble breathing. This information is not intended to replace advice given to you by your health care provider. Make sure you discuss any questions you have with your health care provider. Document Released: 11/25/2015 Document Revised: 03/26/2016 Document Reviewed: 11/25/2015 Elsevier Interactive Patient Education  2017 [employer].

## 2016-09-17 ENCOUNTER — Other Ambulatory Visit: Payer: Self-pay

## 2016-09-17 ENCOUNTER — Encounter (HOSPITAL_COMMUNITY)
Admission: RE | Admit: 2016-09-17 | Discharge: 2016-09-17 | Disposition: A | Discharge status: Home / Self Care | Payer: Medicare Other | Source: Ambulatory Visit | Attending: Gastroenterology | Admitting: Gastroenterology

## 2016-09-17 ENCOUNTER — Encounter (HOSPITAL_COMMUNITY): Payer: Self-pay

## 2016-09-17 DIAGNOSIS — Z01810 Encounter for preprocedural cardiovascular examination: Secondary | ICD-10-CM | POA: Diagnosis present

## 2016-09-17 DIAGNOSIS — Z01812 Encounter for preprocedural laboratory examination: Secondary | ICD-10-CM | POA: Insufficient documentation

## 2016-09-17 HISTORY — DX: Polyneuropathy, unspecified: G62.9

## 2016-09-17 LAB — BASIC METABOLIC PANEL
Anion gap: 7 (ref 5–15)
BUN: 19 mg/dL (ref 6–20)
CO2: 29 mmol/L (ref 22–32)
Calcium: 8.9 mg/dL (ref 8.9–10.3)
Chloride: 102 mmol/L (ref 101–111)
Creatinine, Ser: 0.87 mg/dL (ref 0.44–1.00)
GFR calc Af Amer: >60 mL/min (ref >60)
GFR calc non Af Amer: >60 mL/min (ref >60)
Glucose, Bld: 159 mg/dL — ABNORMAL HIGH (ref 65–99)
Potassium: 3.6 mmol/L (ref 3.5–5.1)
Sodium: 138 mmol/L (ref 135–145)

## 2016-09-17 LAB — CBC WITH DIFFERENTIAL/PLATELET
Basophils Absolute: 0 10*3/uL (ref 0.0–0.1)
Basophils Relative: 0 %
Eosinophils Absolute: 0.2 10*3/uL (ref 0.0–0.7)
Eosinophils Relative: 3 %
HCT: 33.6 % — ABNORMAL LOW (ref 36.0–46.0)
Hemoglobin: 11 g/dL — ABNORMAL LOW (ref 12.0–15.0)
Lymphocytes Relative: 24 %
Lymphs Abs: 1.7 10*3/uL (ref 0.7–4.0)
MCH: 27.8 pg (ref 26.0–34.0)
MCHC: 32.7 g/dL (ref 30.0–36.0)
MCV: 84.8 fL (ref 78.0–100.0)
Monocytes Absolute: 0.7 10*3/uL (ref 0.1–1.0)
Monocytes Relative: 10 %
Neutro Abs: 4.4 10*3/uL (ref 1.7–7.7)
Neutrophils Relative %: 63 %
Platelets: 319 10*3/uL (ref 150–400)
RBC: 3.96 MIL/uL (ref 3.87–5.11)
RDW: 16.4 % — ABNORMAL HIGH (ref 11.5–15.5)
WBC: 7 10*3/uL (ref 4.0–10.5)

## 2016-09-19 NOTE — Progress Notes (Signed)
PT is aware. She has a cardiologist in [place] , Dr. Eliezer Bottom, and she will make an appt with him.

## 2016-09-22 NOTE — Anesthesia Preprocedure Evaluation (Addendum)
Anesthesia Evaluation  Patient identified by MRN, date of birth, ID band Patient awake    Reviewed: Allergy & Precautions, NPO status , Patient's Chart, lab work & pertinent test results  History of Anesthesia Complications (+) PONV and history of anesthetic complications  Airway Mallampati: II  TM Distance: >3 FB     Dental  (+) Edentulous Upper, Edentulous Lower   Pulmonary former smoker,    breath sounds clear to auscultation       Cardiovascular negative cardio ROS   Rhythm:Regular Rate:Normal     Neuro/Psych PSYCHIATRIC DISORDERS Depression    GI/Hepatic GERD  ,  Endo/Other  diabetes, Type 2, Insulin Dependent  Renal/GU      Musculoskeletal  (+) Arthritis , Fibromyalgia -  Abdominal   Peds  Hematology   Anesthesia Other Findings   Reproductive/Obstetrics                            Anesthesia Physical Anesthesia Plan  ASA: III  Anesthesia Plan: MAC   Post-op Pain Management:    Induction: Intravenous  Airway Management Planned: Simple Face Mask  Additional Equipment:   Intra-op Plan:   Post-operative Plan:   Informed Consent: I have reviewed the patients History and Physical, chart, labs and discussed the procedure including the risks, benefits and alternatives for the proposed anesthesia with the patient or authorized representative who has indicated his/her understanding and acceptance.     Plan Discussed with:   Anesthesia Plan Comments:         Anesthesia Quick Evaluation

## 2016-09-22 NOTE — Progress Notes (Signed)
CC'D TO CARDIOLOGIST

## 2016-09-23 ENCOUNTER — Ambulatory Visit (HOSPITAL_COMMUNITY)
Admission: RE | Admit: 2016-09-23 | Discharge: 2016-09-23 | Disposition: A | Discharge status: Home / Self Care | Payer: Medicare Other | Source: Ambulatory Visit | Attending: Gastroenterology | Admitting: Gastroenterology

## 2016-09-23 ENCOUNTER — Encounter (HOSPITAL_COMMUNITY)
Admission: RE | Disposition: A | Discharge status: Home / Self Care | Payer: Self-pay | Source: Ambulatory Visit | Attending: Gastroenterology

## 2016-09-23 ENCOUNTER — Encounter (HOSPITAL_COMMUNITY): Payer: Self-pay | Admitting: *Deleted

## 2016-09-23 ENCOUNTER — Ambulatory Visit (HOSPITAL_COMMUNITY): Payer: Medicare Other | Admitting: Anesthesiology

## 2016-09-23 DIAGNOSIS — Z87891 Personal history of nicotine dependence: Secondary | ICD-10-CM | POA: Insufficient documentation

## 2016-09-23 DIAGNOSIS — K921 Melena: Secondary | ICD-10-CM | POA: Diagnosis not present

## 2016-09-23 DIAGNOSIS — Z79899 Other long term (current) drug therapy: Secondary | ICD-10-CM | POA: Insufficient documentation

## 2016-09-23 DIAGNOSIS — R195 Other fecal abnormalities: Secondary | ICD-10-CM | POA: Diagnosis not present

## 2016-09-23 DIAGNOSIS — E1140 Type 2 diabetes mellitus with diabetic neuropathy, unspecified: Secondary | ICD-10-CM | POA: Diagnosis not present

## 2016-09-23 DIAGNOSIS — K5730 Diverticulosis of large intestine without perforation or abscess without bleeding: Secondary | ICD-10-CM | POA: Insufficient documentation

## 2016-09-23 DIAGNOSIS — Z96651 Presence of right artificial knee joint: Secondary | ICD-10-CM | POA: Diagnosis not present

## 2016-09-23 DIAGNOSIS — K5900 Constipation, unspecified: Secondary | ICD-10-CM

## 2016-09-23 DIAGNOSIS — K635 Polyp of colon: Secondary | ICD-10-CM | POA: Insufficient documentation

## 2016-09-23 DIAGNOSIS — E785 Hyperlipidemia, unspecified: Secondary | ICD-10-CM | POA: Diagnosis not present

## 2016-09-23 DIAGNOSIS — M797 Fibromyalgia: Secondary | ICD-10-CM | POA: Diagnosis not present

## 2016-09-23 DIAGNOSIS — K219 Gastro-esophageal reflux disease without esophagitis: Secondary | ICD-10-CM | POA: Diagnosis not present

## 2016-09-23 DIAGNOSIS — D122 Benign neoplasm of ascending colon: Secondary | ICD-10-CM

## 2016-09-23 DIAGNOSIS — Z794 Long term (current) use of insulin: Secondary | ICD-10-CM | POA: Insufficient documentation

## 2016-09-23 DIAGNOSIS — D124 Benign neoplasm of descending colon: Secondary | ICD-10-CM

## 2016-09-23 DIAGNOSIS — Z85828 Personal history of other malignant neoplasm of skin: Secondary | ICD-10-CM | POA: Insufficient documentation

## 2016-09-23 DIAGNOSIS — C183 Malignant neoplasm of hepatic flexure: Secondary | ICD-10-CM | POA: Insufficient documentation

## 2016-09-23 DIAGNOSIS — M069 Rheumatoid arthritis, unspecified: Secondary | ICD-10-CM | POA: Diagnosis not present

## 2016-09-23 DIAGNOSIS — Z7982 Long term (current) use of aspirin: Secondary | ICD-10-CM | POA: Insufficient documentation

## 2016-09-23 DIAGNOSIS — D127 Benign neoplasm of rectosigmoid junction: Secondary | ICD-10-CM | POA: Diagnosis not present

## 2016-09-23 DIAGNOSIS — D379 Neoplasm of uncertain behavior of digestive organ, unspecified: Secondary | ICD-10-CM | POA: Diagnosis not present

## 2016-09-23 DIAGNOSIS — D123 Benign neoplasm of transverse colon: Secondary | ICD-10-CM | POA: Diagnosis not present

## 2016-09-23 DIAGNOSIS — D128 Benign neoplasm of rectum: Secondary | ICD-10-CM | POA: Insufficient documentation

## 2016-09-23 HISTORY — PX: BIOPSY: SHX5522

## 2016-09-23 HISTORY — PX: POLYPECTOMY: SHX5525

## 2016-09-23 HISTORY — PX: COLONOSCOPY WITH PROPOFOL: SHX5780

## 2016-09-23 LAB — HEPATIC FUNCTION PANEL
ALT: 17 U/L (ref 14–54)
AST: 19 U/L (ref 15–41)
Albumin: 2.9 g/dL — ABNORMAL LOW (ref 3.5–5.0)
Alkaline Phosphatase: 45 U/L (ref 38–126)
Bilirubin, Direct: 0.1 mg/dL (ref 0.1–0.5)
Indirect Bilirubin: 0.4 mg/dL (ref 0.3–0.9)
Total Bilirubin: 0.5 mg/dL (ref 0.3–1.2)
Total Protein: 5.5 g/dL — ABNORMAL LOW (ref 6.5–8.1)

## 2016-09-23 LAB — GLUCOSE, CAPILLARY
Glucose-Capillary: 165 mg/dL — ABNORMAL HIGH (ref 65–99)
Glucose-Capillary: 134 mg/dL — ABNORMAL HIGH (ref 65–99)

## 2016-09-23 LAB — PROTIME-INR
INR: 1.03
Prothrombin Time: 13.5 seconds (ref 11.4–15.2)

## 2016-09-23 SURGERY — COLONOSCOPY WITH PROPOFOL
Anesthesia: Monitor Anesthesia Care

## 2016-09-23 MED ORDER — SPOT INK MARKER SYRINGE KIT
PACK | SUBMUCOSAL | Status: DC | PRN
  Administered 2016-09-23: 5 mL via SUBMUCOSAL

## 2016-09-23 MED ORDER — MIDAZOLAM HCL 2 MG/2ML IJ SOLN
1.0000 mg | INTRAMUSCULAR | Status: DC | PRN
  Administered 2016-09-23: 2 mg via INTRAVENOUS
  Filled 2016-09-23: qty 2

## 2016-09-23 MED ORDER — FENTANYL CITRATE (PF) 100 MCG/2ML IJ SOLN
25.0000 ug | INTRAMUSCULAR | Status: AC
  Administered 2016-09-23 (×2): 25 ug via INTRAVENOUS

## 2016-09-23 MED ORDER — CHLORHEXIDINE GLUCONATE CLOTH 2 % EX PADS: 6.0000 | MEDICATED_PAD | Freq: Once | CUTANEOUS | Status: DC

## 2016-09-23 MED ORDER — PROPOFOL 500 MG/50ML IV EMUL
INTRAVENOUS | Status: DC | PRN
  Administered 2016-09-23: 08:00:00 via INTRAVENOUS
  Administered 2016-09-23: 150 ug/kg/min via INTRAVENOUS

## 2016-09-23 MED ORDER — MIDAZOLAM HCL 5 MG/5ML IJ SOLN
INTRAMUSCULAR | Status: DC | PRN
  Administered 2016-09-23 (×2): 1 mg via INTRAVENOUS

## 2016-09-23 MED ORDER — MIDAZOLAM HCL 2 MG/2ML IJ SOLN
INTRAMUSCULAR | Status: AC
  Filled 2016-09-23: qty 2

## 2016-09-23 MED ORDER — PROPOFOL 10 MG/ML IV BOLUS
INTRAVENOUS | Status: AC
  Filled 2016-09-23: qty 20

## 2016-09-23 MED ORDER — LIDOCAINE HCL (CARDIAC) 10 MG/ML IV SOLN
INTRAVENOUS | Status: DC | PRN
  Administered 2016-09-23: 30 mg via INTRAVENOUS

## 2016-09-23 MED ORDER — PROPOFOL 10 MG/ML IV BOLUS
INTRAVENOUS | Status: AC
  Filled 2016-09-23: qty 40

## 2016-09-23 MED ORDER — FENTANYL CITRATE (PF) 100 MCG/2ML IJ SOLN
INTRAMUSCULAR | Status: AC
  Filled 2016-09-23: qty 2

## 2016-09-23 MED ORDER — EPINEPHRINE PF 1 MG/10ML IJ SOSY
PREFILLED_SYRINGE | INTRAMUSCULAR | Status: AC
  Filled 2016-09-23: qty 10

## 2016-09-23 MED ORDER — LACTATED RINGERS IV SOLN
INTRAVENOUS | Status: DC
  Administered 2016-09-23: 1000 mL via INTRAVENOUS

## 2016-09-23 NOTE — Anesthesia Procedure Notes (Signed)
Procedure Name: MAC Date/Time: 09/23/2016 7:30 AM Performed by: Andree Elk, AMY A Pre-anesthesia Checklist: Patient identified, Emergency Drugs available, Patient being monitored, Suction available and Timeout performed Oxygen Delivery Method: Simple face mask

## 2016-09-23 NOTE — Discharge Instructions (Signed)
YOU HAVE A MASS IN YOUR RIGHT COLON, WHICH IS THE REASON FOR SEEING BLOODIN YOUR STOOL. One large polyp remains in your right colon but it will be removed with the surgery. YOU HAD 5 additional POLYPS REMOVED. YOU HAVE DIVERTICULOSIS IN YOUR LEFT COLON.    YOU WILL NEED SURGERY TO REMOVE THE MASS.  YOUR BIOPSY WILL BE BACK IN 3-5 DAYS.  YOU NEED A CT ABDOMEN AND PELVIS & A CHEST XRAY.  YOU NEED A REPEAT COLONOSCOPY IN 1 YEAR.  YOUR SISTERS, BROTHERS, CHILDREN, AND PARENTS NEED TO HAVE A COLONOSCOPY STARTING AT THE AGE OF 40.     ENDOSCOPY Care After Read the instructions outlined below and refer to this sheet in the next week. These discharge instructions provide you with general information on caring for yourself after you leave the hospital. While your treatment has been planned according to the most current medical practices available, unavoidable complications occasionally occur. If you have any problems or questions after discharge, call DR. FIELDS, [phone].  ACTIVITY  You may resume your regular activity, but move at a slower pace for the next 24 hours.   Take frequent rest periods for the next 24 hours.   Walking will help get rid of the air and reduce the bloated feeling in your belly (abdomen).   No driving for 24 hours (because of the medicine (anesthesia) used during the test).   You may shower.   Do not sign any important legal documents or operate any machinery for 24 hours (because of the anesthesia used during the test).    NUTRITION  Drink plenty of fluids.   You may resume your normal diet as instructed by your doctor.   Begin with a light meal and progress to your normal diet. Heavy or fried foods are harder to digest and may make you feel sick to your stomach (nauseated).   Avoid alcoholic beverages for 24 hours or as instructed.    MEDICATIONS  You may resume your normal medications.   WHAT YOU CAN EXPECT TODAY  Some feelings of bloating  in the abdomen.   Passage of more gas than usual.   Spotting of blood in your stool or on the toilet paper     IF YOU HADBIOPSIES TAKEN  DURING THE SIGMOIDOSCOPY/UPPER ENDOSCOPY:  Eat a soft diet IF YOU HAVE NAUSEA, BLOATING, ABDOMINAL PAIN, OR VOMITING.    FINDING OUT THE RESULTS OF YOUR TEST Not all test results are available during your visit. DR. Oneida Alar WILL CALL YOU WITHIN 14 DAYS OF YOUR PROCEDUE WITH YOUR RESULTS. Do not assume everything is normal if you have not heard from DR. FIELDS, CALL HER OFFICE AT [phone].  SEEK IMMEDIATE MEDICAL ATTENTION AND CALL THE OFFICE: [phone] IF:  You have more than a spotting of blood in your stool.   Your belly is swollen (abdominal distention).   You are nauseated or vomiting.   You have a temperature over 101F.   You have abdominal pain or discomfort that is severe or gets worse throughout the day.  High-Fiber Diet A high-fiber diet changes your normal diet to include more whole grains, legumes, fruits, and vegetables. Changes in the diet involve replacing refined carbohydrates with unrefined foods. The calorie level of the diet is essentially unchanged. The Dietary Reference Intake (recommended amount) for adult males is 38 grams per day. For adult females, it is 25 grams per day. Pregnant and lactating women should consume 28 grams of fiber per day. Fiber is the  intact part of a plant that is not broken down during digestion. Functional fiber is fiber that has been isolated from the plant to provide a beneficial effect in the body. PURPOSE  Increase stool bulk.   Ease and regulate bowel movements.   Lower cholesterol.   REDUCE RISK OF COLON CANCER  INDICATIONS THAT YOU NEED MORE FIBER  Constipation and hemorrhoids.   Uncomplicated diverticulosis (intestine condition) and irritable bowel syndrome.   Weight management.   As a protective measure against hardening of the arteries (atherosclerosis), diabetes, and  cancer.   GUIDELINES FOR INCREASING FIBER IN THE DIET  Start adding fiber to the diet slowly. A gradual increase of about 5 more grams (2 slices of whole-wheat bread, 2 servings of most fruits or vegetables, or 1 bowl of high-fiber cereal) per day is best. Too rapid an increase in fiber may result in constipation, flatulence, and bloating.   Drink enough water and fluids to keep your urine clear or pale yellow. Water, juice, or caffeine-free drinks are recommended. Not drinking enough fluid may cause constipation.   Eat a variety of high-fiber foods rather than one type of fiber.   Try to increase your intake of fiber through using high-fiber foods rather than fiber pills or supplements that contain small amounts of fiber.   The goal is to change the types of food eaten. Do not supplement your present diet with high-fiber foods, but replace foods in your present diet.   INCLUDE A VARIETY OF FIBER SOURCES  Replace refined and processed grains with whole grains, canned fruits with fresh fruits, and incorporate other fiber sources. White rice, white breads, and most bakery goods contain little or no fiber.   Brown whole-grain rice, buckwheat oats, and many fruits and vegetables are all good sources of fiber. These include: broccoli, Brussels sprouts, cabbage, cauliflower, beets, sweet potatoes, white potatoes (skin on), carrots, tomatoes, eggplant, squash, berries, fresh fruits, and dried fruits.   Cereals appear to be the richest source of fiber. Cereal fiber is found in whole grains and bran. Bran is the fiber-rich outer coat of cereal grain, which is largely removed in refining. In whole-grain cereals, the bran remains. In breakfast cereals, the largest amount of fiber is found in those with "bran" in their names. The fiber content is sometimes indicated on the label.   You may need to include additional fruits and vegetables each day.   In baking, for 1 cup white flour, you may use the  following substitutions:   1 cup whole-wheat flour minus 2 tablespoons.   1/2 cup white flour plus 1/2 cup whole-wheat flour.   Polyps, Colon  A polyp is extra tissue that grows inside your body. Colon polyps grow in the large intestine. The large intestine, also called the colon, is part of your digestive system. It is a long, hollow tube at the end of your digestive tract where your body makes and stores stool. Most polyps are not dangerous. They are benign. This means they are not cancerous. But over time, some types of polyps can turn into cancer. Polyps that are smaller than a pea are usually not harmful. But larger polyps could someday become or may already be cancerous. To be safe, doctors remove all polyps and test them.   WHO GETS POLYPS? Anyone can get polyps, but certain people are more likely than others. You may have a greater chance of getting polyps if:  You are over 50.   You have had polyps before.  Someone in your family has had polyps.   Someone in your family has had cancer of the large intestine.   Find out if someone in your family has had polyps. You may also be more likely to get polyps if you:   Eat a lot of fatty foods   Smoke   Drink alcohol   Do not exercise  Eat too much    PREVENTION There is not one sure way to prevent polyps. You might be able to lower your risk of getting them if you:  Eat more fruits and vegetables and less fatty food.   Do not smoke.   Avoid alcohol.   Exercise every day.   Lose weight if you are overweight.   Eating more calcium and folate can also lower your risk of getting polyps. Some foods that are rich in calcium are milk, cheese, and broccoli. Some foods that are rich in folate are chickpeas, kidney beans, and spinach.   Diverticulosis Diverticulosis is a common condition that develops when small pouches (diverticula) form in the wall of the colon. The risk of diverticulosis increases with age. It happens more  often in people who eat a low-fiber diet. Most individuals with diverticulosis have no symptoms. Those individuals with symptoms usually experience belly (abdominal) pain, constipation, or loose stools (diarrhea).  HOME CARE INSTRUCTIONS  Increase the amount of fiber in your diet as directed by your caregiver or dietician. This may reduce symptoms of diverticulosis.   Drink at least 6 to 8 glasses of water each day to prevent constipation.   Try not to strain when you have a bowel movement.   Avoiding nuts and seeds to prevent complications is NOT NECESSARY.    FOODS HAVING HIGH FIBER CONTENT INCLUDE:  Fruits. Apple, peach, pear, tangerine, raisins, prunes.   Vegetables. Brussels sprouts, asparagus, broccoli, cabbage, carrot, cauliflower, romaine lettuce, spinach, summer squash, tomato, winter squash, zucchini.   Starchy Vegetables. Baked beans, kidney beans, lima beans, split peas, lentils, potatoes (with skin).   Grains. Whole wheat bread, brown rice, bran flake cereal, plain oatmeal, white rice, shredded wheat, bran muffins.    SEEK IMMEDIATE MEDICAL CARE IF:  You develop increasing pain or severe bloating.   You have an oral temperature above 101F.   You develop vomiting or bowel movements that are bloody or black.    Colonoscopy, Adult, Care After This sheet gives you information about how to care for yourself after your procedure. Your health care provider may also give you more specific instructions. If you have problems or questions, contact your health care provider. What can I expect after the procedure? After the procedure, it is common to have:  A small amount of blood in your stool for 24 hours after the procedure.  Some gas.  Mild abdominal cramping or bloating. Follow these instructions at home: General instructions  For the first 24 hours after the procedure:  Do not drive or use machinery.  Do not sign important documents.  Do not drink  alcohol.  Do your regular daily activities at a slower pace than normal.  Eat soft, easy-to-digest foods.  Rest often.  Take over-the-counter or prescription medicines only as told by your health care provider.  It is up to you to get the results of your procedure. Ask your health care provider, or the department performing the procedure, when your results will be ready. Relieving cramping and bloating  Try walking around when you have cramps or feel bloated.  Apply heat  to your abdomen as told by your health care provider. Use a heat source that your health care provider recommends, such as a moist heat pack or a heating pad.  Place a towel between your skin and the heat source.  Leave the heat on for 20-30 minutes.  Remove the heat if your skin turns bright red. This is especially important if you are unable to feel pain, heat, or cold. You may have a greater risk of getting burned. Eating and drinking  Drink enough fluid to keep your urine clear or pale yellow.  Resume your normal diet as instructed by your health care provider. Avoid heavy or fried foods that are hard to digest.  Avoid drinking alcohol for as long as instructed by your health care provider. Contact a health care provider if:  You have blood in your stool 2-3 days after the procedure. Get help right away if:  You have more than a small spotting of blood in your stool.  You pass large blood clots in your stool.  Your abdomen is swollen.  You have nausea or vomiting.  You have a fever.  You have increasing abdominal pain that is not relieved with medicine. This information is not intended to replace advice given to you by your health care provider. Make sure you discuss any questions you have with your health care provider. Document Released: 03/18/2004 Document Revised: 04/28/2016 Document Reviewed: 10/16/2015 Elsevier Interactive Patient Education  2017 [employer].

## 2016-09-23 NOTE — Op Note (Signed)
[facility] Patient Name: Wanda Curry Procedure Date: 09/23/2016 7:20 AM MRN: [id number] Date of Birth: [birth year] Attending MD: Barney Drain , MD CSN: [id number] Age: 71 Admit Type: Outpatient Procedure:                Colonoscopy with snare polypectomy, cold forceps                            biopsy, and tattoo Indications:              Hematochezia, Heme positive stool Providers:                Barney Drain, MD, Janeece Riggers, RN, Randa Spike,                            Technician Referring MD:             Earney Mallet Medicines:                Propofol per Anesthesia Complications:            No immediate complications. Estimated Blood Loss:     Estimated blood loss was minimal. Procedure:                Pre-Anesthesia Assessment:                           - Prior to the procedure, a History and Physical                            was performed, and patient medications and                            allergies were reviewed. The patient's tolerance of                            previous anesthesia was also reviewed. The risks                            and benefits of the procedure and the sedation                            options and risks were discussed with the patient.                            All questions were answered, and informed consent                            was obtained. Prior Anticoagulants: The patient has                            taken aspirin. ASA Grade Assessment: II - A patient                            with mild systemic disease. After reviewing the  risks and benefits, the patient was deemed in                            satisfactory condition to undergo the procedure.                            After obtaining informed consent, the colonoscope                            was passed under direct vision. Throughout the                            procedure, the patient's blood pressure, pulse, and           oxygen saturations were monitored continuously. The                            [id number] scope was introduced through                            the anus and advanced to the 5 cm into the ileum.                            The terminal ileum, ileocecal valve, appendiceal                            orifice, and rectum were photographed. The                            colonoscopy was somewhat difficult due to a                            tortuous colon. Successful completion of the                            procedure was aided by COLOWRAP. The patient                            tolerated the procedure well. The quality of the                            bowel preparation was good. Scope In: 7:45:01 AM Scope Out: 8:13:16 AM Scope Withdrawal Time: 0 hours 25 minutes 0 seconds  Total Procedure Duration: 0 hours 28 minutes 15 seconds  Findings:      Five sessile polyps were found in the recto-sigmoid colon, descending       colon and mid transverse colon(3). The polyps were 5 to 15 mm in size.       These polyps were removed with a hot snare. Resection and retrieval were       complete. Area was tattooed with an injection of 1 mL of Spot (carbon       black).      A 25 mm, recently bleeding (based on stigmata of recent bleeding) polyp       was found in the proximal ascending colon.  The polyp was sessile. Area       was successfully injected with 1 mL Spot (carbon black) for tattooing.      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 The mass was non-circumferential. In addition, its diameter       measured three to four mm. No bleeding was present. This was biopsied       with a cold jumbo forceps for histology. Area was tattooed with an       injection of 3 mL of Spot (carbon black).      Multiple small and large-mouthed diverticula were found in the sigmoid       colon and descending colon. Impression:               - One 25 mm,  recently bleeding polyp in the                            proximal ascending colon, NOT REMOVED.                           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 Biopsied.                           - Diverticulosis in the sigmoid colon and in the                            descending colon. Moderate Sedation:      Per Anesthesia Care Recommendation:           - High fiber diet.                           - Continue present medications.                           - Await pathology results.                           - Repeat colonoscopy in 1 year for surveillance.                           - Patient has a contact number available for                            emergencies. The signs and symptoms of potential                            delayed complications were discussed with the                            patient. Return to normal activities tomorrow.  Written discharge instructions were provided to the                            patient. Procedure Code(s):        --- Professional ---                           [phone], Colonoscopy, flexible; with removal of                            tumor(s), polyp(s), or other lesion(s) by snare                            technique                           45381, Colonoscopy, flexible; with directed                            submucosal injection(s), any substance                           [id number], 87, Colonoscopy, flexible; with biopsy,                            single or multiple Diagnosis Code(s):        --- Professional ---                           D12.7, Benign neoplasm of rectosigmoid junction                           D12.4, Benign neoplasm of descending colon                           D12.3, Benign neoplasm of transverse colon (hepatic                            flexure or splenic flexure)                            D12.2, Benign neoplasm of ascending colon                           D49.0, Neoplasm of unspecified behavior of                            digestive system                           K92.1, Melena (includes Hematochezia)                           R19.5, Other fecal abnormalities                           K57.30, Diverticulosis of large intestine without  perforation or abscess without bleeding CPT copyright 2016 American [employer]. All rights reserved. The codes documented in this report are preliminary and upon coder review may  be revised to meet current compliance requirements. Barney Drain, MD Barney Drain, MD 09/23/2016 8:31:37 AM This report has been signed electronically. Number of Addenda: 0

## 2016-09-23 NOTE — Transfer of Care (Signed)
Immediate Anesthesia Transfer of Care Note  Patient: Wanda Curry  Procedure(s) Performed: Procedure(s) with comments: COLONOSCOPY WITH PROPOFOL (N/A) - 7:30 am BIOPSY - hepatic flexure mass POLYPECTOMY - transverse colon polyps times 2, rectal polyp  Patient Location: PACU  Anesthesia Type:MAC  Level of Consciousness: awake, alert , oriented and patient cooperative  Airway & Oxygen Therapy: Patient Spontanous Breathing and Patient connected to nasal cannula oxygen  Post-op Assessment: Report given to RN and Post -op Vital signs reviewed and stable  Post vital signs: Reviewed and stable  Last Vitals:  Vitals:   09/23/16 0700 09/23/16 0715  BP: (!) 123/54 (!) 118/50  Resp: 15 12  Temp:      Last Pain:  Vitals:   09/23/16 0643  TempSrc: Oral      Patients Stated Pain Goal: 9 (70/96/28 3662)  Complications: No apparent anesthesia complications

## 2016-09-23 NOTE — Anesthesia Postprocedure Evaluation (Signed)
Anesthesia Post Note  Patient: Wanda Curry  Procedure(s) Performed: Procedure(s) (LRB): COLONOSCOPY WITH PROPOFOL (N/A) BIOPSY POLYPECTOMY  Patient location during evaluation: PACU Anesthesia Type: MAC Level of consciousness: awake and alert and oriented Pain management: pain level controlled Vital Signs Assessment: post-procedure vital signs reviewed and stable Respiratory status: spontaneous breathing Cardiovascular status: stable Postop Assessment: no signs of nausea or vomiting Anesthetic complications: no     Last Vitals:  Vitals:   09/23/16 0700 09/23/16 0715  BP: (!) 123/54 (!) 118/50  Resp: 15 12  Temp:      Last Pain:  Vitals:   09/23/16 0643  TempSrc: Oral                 Seleny Allbright A

## 2016-09-23 NOTE — H&P (Signed)
Primary Care Physician:  Earney Mallet, MD Primary Gastroenterologist:  Dr. Oneida Alar  Pre-Procedure History & Physical: HPI:  Wanda Curry is a 71 y.o. female here for [place].  Past Medical History:  Diagnosis Date  . Anemia   . Arthritis    RA  . Cancer ([place])    skin cancer  . Depression   . Diabetes mellitus without complication ([place])   . Fibromyalgia   . GERD (gastroesophageal reflux disease)   . Heart murmur    ECHO scheduled 05-25-2015  . Hyperlipidemia   . Neuropathy ([place])   . PONV (postoperative nausea and vomiting)     Past Surgical History:  Procedure Laterality Date  . ABDOMINAL HYSTERECTOMY    . CHOLECYSTECTOMY    . HARDWARE REMOVAL Right 05/30/2015   Procedure: HARDWARE REMOVAL;  Surgeon: Ninetta Lights, MD;  Location: [facility];  Service: Orthopedics;  Laterality: Right;  . planter warts Bilateral    both feet  . skin grafts     due to planter warts  . TOTAL KNEE ARTHROPLASTY Right 05/30/2015   Procedure: RIGHT TOTAL KNEE ARTHROPLASTY;  Surgeon: Ninetta Lights, MD;  Location: [facility];  Service: Orthopedics;  Laterality: Right;    Prior to Admission medications   Medication Sig Start Date End Date Taking? Authorizing Provider  aspirin EC 325 MG tablet Take 325 mg by mouth daily.   Yes Historical Provider, MD  atorvastatin (LIPITOR) 20 MG tablet Take 20 mg by mouth daily.   Yes Historical Provider, MD  Calcium Carb-Cholecalciferol (CALCIUM + D3) 600-200 MG-UNIT TABS Take 1 tablet by mouth 2 (two) times daily. 07/16/16  Yes Historical Provider, MD  carvedilol (COREG) 6.25 MG tablet Take 6.25 mg by mouth 2 (two) times daily with a meal.   Yes Historical Provider, MD  Cholecalciferol (VITAMIN D3) 50000 units CAPS Take 50,000 Units by mouth every Tuesday. 08/02/16  Yes Historical Provider, MD  folic acid (FOLVITE) 1 MG tablet Take 1 mg by mouth daily.   Yes Historical Provider, MD  gabapentin (NEURONTIN) 600 MG tablet Take 600 mg by mouth 3 (three)  times daily.   Yes Historical Provider, MD  GLUCOSAMINE-CHONDROITIN PO Take 1 tablet by mouth 2 (two) times daily.   Yes Historical Provider, MD  hydrochlorothiazide (HYDRODIURIL) 25 MG tablet Take 25 mg by mouth daily.   Yes Historical Provider, MD  HYDROcodone-acetaminophen (NORCO) 7.5-325 MG tablet Take 1 tablet by mouth 2 (two) times daily. 08/15/16  Yes Historical Provider, MD  insulin NPH-regular Human (NOVOLIN 70/30) (70-30) 100 UNIT/ML injection Inject 30 Units into the skin 2 (two) times daily with a meal.   Yes Historical Provider, MD  LANTUS SOLOSTAR 100 UNIT/ML Solostar Pen Inject 45 Units into the skin at bedtime. 08/16/16  Yes Historical Provider, MD  methotrexate (RHEUMATREX) 2.5 MG tablet Take 20 mg by mouth every Sunday. Caution:Chemotherapy. Protect from light.    Yes Historical Provider, MD  Milnacipran HCl (SAVELLA) 100 MG TABS tablet Take 100 mg by mouth 2 (two) times daily.   Yes Historical Provider, MD  omeprazole (PRILOSEC) 40 MG capsule Take 40 mg by mouth daily.   Yes Historical Provider, MD  rOPINIRole (REQUIP) 1 MG tablet Take 1 mg by mouth 3 (three) times daily.   Yes Historical Provider, MD    Allergies as of 09/10/2016 - Review Complete 09/10/2016  Allergen Reaction Noted  . Adhesive [tape]  05/16/2015  . Sulfa antibiotics Nausea Only and Other (See Comments) 05/30/2015  . Erythromycin Itching  and Rash 05/16/2015    Family History  Problem Relation Age of Onset  . Colon cancer Neg Hx     Social History   Social History  . Marital status: Widowed    Spouse name: N/A  . Number of children: N/A  . Years of education: N/A   Occupational History  . Not on file.   Social History Main Topics  . Smoking status: Former Smoker    Packs/day: 1.00    Years: 40.00    Types: Cigarettes    Quit date: 08/18/2004  . Smokeless tobacco: Never Used  . Alcohol use No  . Drug use: No  . Sexual activity: Not Currently    Birth control/ protection: Surgical   Other  Topics Concern  . Not on file   Social History Narrative  . No narrative on file    Review of Systems: See HPI, otherwise negative ROS   Physical Exam: BP (!) 123/54   Temp 97.6 F (36.4 C) (Oral)   Resp 15   SpO2 100%  General:   Alert,  pleasant and cooperative in NAD Head:  Normocephalic and atraumatic. Neck:  Supple; Lungs:  Clear throughout to auscultation.    Heart:  Regular rate and rhythm. Abdomen:  Soft, nontender and nondistended. Normal bowel sounds, without guarding, and without rebound.   Neurologic:  Alert and  oriented x4;  grossly normal neurologically.  Impression/Plan:     HEME POS STOOLS  PLAN:  1.TCS TODAY. DISCUSSED PROCEDURE, BENEFITS, & RISKS: < 1% chance of medication reaction, bleeding, perforation, or rupture of spleen/liver.

## 2016-09-24 ENCOUNTER — Telehealth: Payer: Self-pay | Admitting: Gastroenterology

## 2016-09-24 LAB — CEA: CEA: 3.4 ng/mL (ref 0.0–4.7)

## 2016-09-24 NOTE — Telephone Encounter (Signed)
Reminder in epic °

## 2016-09-24 NOTE — Telephone Encounter (Signed)
SHE NEEDS TO COMPLETE CXR: PA/LAT  AND CT SCAN ABD/PELVIS W/ IV CONTRAST, DX: ASCENDING COLON CANCER. SHE NEEDS TO SEE DR. Arnoldo Morale NEXT WEEK TO DISCUSS A HAVING HER RIGHT COLON REMOVED.

## 2016-09-24 NOTE — Telephone Encounter (Addendum)
PLEASE CALL PT. SHE DOES HAVE COLON CANCER LOCATED IN HER RIGHT COLON. SHE NEEDS TO COMPLETE CXR: PA/LAT  AND CT SCAN ABD/PELVIS W/ IV CONTRAST, DX: ASCENDING COLON CANCER. SHE NEEDS TO SEE DR. Arnoldo Morale NEXT WEEK TO DISCUSS A HAVING HER RIGHT COLON REMOVED.  HER LIVER TESTS AND BLOOD TUMOR MARKER ARE NORMAL.  NEXT COLONOSCOPY IN ONE YEAR. YOUR SISTERS, BROTHERS, AND CHILDREN NEED TO HAVE A COLONOSCOPY STARTING AT THE AGE OF 40.     Sep 26 1003: PT AND DAUGHTER AWARE FEB 6 THAT SHE LIKELY HAS COLON CANCER. THE PLAN HAS ALREADY BEEN DISCUSSED A WITH PT/DAUGHTER AND THEY WERE AWARE/DOCUMENTED ON AVS ON FEB 6.

## 2016-09-24 NOTE — Telephone Encounter (Signed)
Referral to Arnoldo Morale has been made

## 2016-09-25 ENCOUNTER — Other Ambulatory Visit: Payer: Self-pay

## 2016-09-25 DIAGNOSIS — C189 Malignant neoplasm of colon, unspecified: Secondary | ICD-10-CM

## 2016-09-25 NOTE — Telephone Encounter (Signed)
Pt is aware of appointments and times

## 2016-09-25 NOTE — Telephone Encounter (Signed)
Pt has an appointment with Dr.Jenkins on 09/29/16 @ 12:00

## 2016-09-25 NOTE — Telephone Encounter (Signed)
CT and CXR  is set up for 10/06/16 @ 10:30 am.

## 2016-09-29 ENCOUNTER — Encounter (HOSPITAL_COMMUNITY): Payer: Self-pay | Admitting: Gastroenterology

## 2016-09-30 NOTE — H&P (Signed)
  NTS SOAP Note  Vital Signs:  Vitals as of: 123XX123: Systolic 123XX123: Diastolic 54: Heart Rate 87: Temp 97.41F (Temporal): Height 36ft 5.5in: Weight 255Lbs 0 Ounces: BMI 41.79   BMI : 41.79 kg/m2  Subjective: This 71 year old female presents for of colon cancer.   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  She had other polyps throughout the colon which were just tubular adenomas.  There was no high-grade dysplasia present.  Patient has never had a colonoscopy.  She denies any history of colon cancer.  She denies any recent abdominal pain or weight loss.  Patient was referred by Dr. Oneida Alar.  Review of Symptoms:  Constitutional:fatigue Head:negative Eyes:negative Nose/Mouth/Throat:negative Cardiovascular:negative Respiratory:negative Gastrointestinnegative Genitourinary:negative joint, neck, and back pain Skin:negative Hematolgic/Lymphatic:negative Allergic/Immunologic:negative   Past Medical History:Reviewed  Past Medical History  Surgical History: Open cholecystectomy, hysterectomy, right knee replacement Medical Problems:  insulin-dependent diabetes mellitus, hypercholesterolemia, hypertension, bone pain Allergies:  shellfish, erythromycin, sulfa drugs, adhesive tape Medications:  Lipitor, carvedilol, folic acid, gabapentin, hydrochlorothiazide, insulin, Robaxin, methotrexate, Prilosec, Percocet, Requip,Savella   Social History:Reviewed  Social History  Preferred Language: English Race:  White Ethnicity: Not Hispanic / Latino Age: 79 year Marital Status:  S Alcohol: no   Smoking Status: Never smoker reviewed on 09/29/2016 Functional Status reviewed on 09/29/2016 ------------------------------------------------ Bathing: Normal Cooking: Normal Dressing: Normal Driving: Normal Eating: Normal Managing Meds: Normal Oral Care: Normal Shopping:  Normal Toileting: Normal Transferring: Normal Walking: Normal Cognitive Status reviewed on 09/29/2016 ------------------------------------------------ Attention: Normal Decision Making: Normal Language: Normal Memory: Normal Motor: Normal Perception: Normal Problem Solving: Normal Visual and Spatial: Normal   Family History:Reviewed  Family Health History Mother, Deceased; Cancer unspecified; Stroke (CVA);  Father, Deceased; Chronic obstructive lung disease (COPD);     Objective Information: General:Well appearing, well nourished in no distress. Head:Atraumatic; no masses; no abnormalities Neck:Supple without lymphadenopathy.  Heart:RRR, no murmur or gallop.  Normal S1, S2.  No S3, S4.  Lungs:CTA bilaterally, no wheezes, rhonchi, rales.  Breathing unlabored. Abdomen:Soft, NT/ND, normal bowel sounds, no HSM, no masses.  No peritoneal signs. Dr. Nona Dell notes, path report reviewed.  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  Trilyte, neomycin, and Flagyl have been prescribed preoperatively for bowel prep.  CT scan of abdomen and pelvis prior to surgery.  This is scheduled.   Patient Education:Alternative treatments to surgery were discussed with patient (and family).Risks and benefits  of procedure   Including bleeding, infection, anastomotic leak, possibility of blood transfusion were fully explained to the patient (and family) who gave informed consent. Patient/family questions were addressed.  Follow-up:Pending Surgery

## 2016-10-02 NOTE — Patient Instructions (Signed)
Wanda Curry  10/02/2016     @PREFPERIOPPHARMACY @   Your procedure is scheduled on 10/10/2016.  Report to [facility] at 6:15 A.M.  Call this number if you have problems the morning of surgery:  [phone]   Remember:  Do not eat food or drink liquids after midnight.  Take these medicines the morning of surgery with A SIP OF WATER Coreg, Gabapentin, Hydrocodone, Robaxin, Methotrexate, Savella, Requip,  Prilosec  TAKE ONLY 1/2 DOSE OF INSULIN EVENING PRIOR TO PROCEDURE   Do not wear jewelry, make-up or nail polish.  Do not wear lotions, powders, or perfumes, or deoderant.  Do not bring valuables to the hospital.  [facility] is not responsible for any belongings or valuables.  Contacts, dentures or bridgework may not be worn into surgery.  Leave your suitcase in the car.  After surgery it may be brought to your room.  For patients admitted to the hospital, discharge time will be determined by your treatment team.    Please read over the following fact sheets that you were given. Surgical Site Infection Prevention and Anesthesia Post-op Instructions     PATIENT INSTRUCTIONS POST-ANESTHESIA  IMMEDIATELY FOLLOWING SURGERY:  Do not drive or operate machinery for the first twenty four hours after surgery.  Do not make any important decisions for twenty four hours after surgery or while taking narcotic pain medications or sedatives.  If you develop intractable nausea and vomiting or a severe headache please notify your doctor immediately.  FOLLOW-UP:  Please make an appointment with your surgeon as instructed. You do not need to follow up with anesthesia unless specifically instructed to do so.  WOUND CARE INSTRUCTIONS (if applicable):  Keep a dry clean dressing on the anesthesia/puncture wound site if there is drainage.  Once the wound has quit draining you may leave it open to air.  Generally you should leave the bandage intact for twenty four hours unless there is drainage.   If the epidural site drains for more than 36-48 hours please call the anesthesia department.  QUESTIONS?:  Please feel free to call your physician or the hospital operator if you have any questions, and they will be happy to assist you.       Open Colectomy An open colectomy is surgery to remove part or all of the large intestine (colon). This procedure may be used to treat several conditions, including:  Inflammation and infection of the colon (diverticulitis).  Tumors or masses in the colon.  Inflammatory bowel disease, such as Crohn disease or ulcerative colitis.  Bleeding from the colon.  Blockage or obstruction of the colon. Tell a health care provider about:  Any allergies you have.  All medicines you are taking, including vitamins, herbs, eye drops, creams, and over-the-counter medicines.  Any problems you or family members have had with anesthetic medicines.  Any blood disorders you have.  Any surgeries you have had.  Any medical conditions you have.  Whether you are pregnant or may be pregnant.  Whether you smoke or use tobacco products. These can affect your body's reaction to anesthesia. What are the risks? Generally, this is a safe procedure. However, problems may occur, including:  Infection.  Bleeding.  Allergic reactions to medicines.  Damage to other structures or organs.  Pneumonia.  The incision opening up.  Tissues from inside the abdomen bulging through the incision (hernia).  Reopening of the colon where it was stitched or stapled together.  A blood clot forming in a vein and  traveling to the lungs.  Future blockage of the small intestine from scar tissue. What happens before the procedure? Staying hydrated  Follow instructions from your health care provider about hydration, which may include:  Up to 2 hours before the procedure - you may continue to drink clear liquids, such as water, clear fruit juice, black coffee, and plain  tea. Eating and drinking restrictions  Follow instructions from your health care provider about eating and drinking, which may include:  8 hours before the procedure - stop eating heavy meals or foods such as meat, fried foods, or fatty foods.  6 hours before the procedure - stop eating light meals or foods, such as toast or cereal.  6 hours before the procedure - stop drinking milk or drinks that contain milk.  2 hours before the procedure - stop drinking clear liquids. Bowel prep  In some cases, you may be prescribed an oral bowel prep to clean out your colon. If so:  Take it as told by your health care provider. Starting the day before your procedure, you may need to drink a large amount of medicated liquid. The liquid will cause you to have multiple loose stools until your stool is almost clear or light green.  Follow instructions from your health care provider about eating and drinking restrictions during bowel prep. Medicines  Ask your health care provider about:  Changing or stopping your regular medicines or vitamins. This is especially important if you are taking diabetes medicines, blood thinners, or vitamin E.  Taking medicines such as aspirin and ibuprofen. These medicines can thin your blood. Do not take these medicines before your procedure if your health care provider instructs you not to.  If you were prescribed an antibiotic medicine, take it as told by your health care provider. General instructions  Bring loose-fitting, comfortable clothing and slip-on shoes that you can put on without bending over.  Make sure to see your health care provider for any tests that you need before the procedure, such as:  Blood tests.  A test to check the heart's rhythm (electrocardiogram, ECG).  A CT scan of your abdomen.  Urine tests.  Colonoscopy.  Plan to have someone take you home from the hospital or clinic.  Arrange for someone to help you with your activities during  your recovery. What happens during the procedure?  To reduce your risk of infection:  Your health care team will wash or sanitize their hands.  Your skin will be washed with soap.  Hair may be removed from the surgical area.  An IV tube will be inserted into one of your veins. The tube will be used to give you medicines and fluids.  You will be given a medicine to make you fall asleep (general anesthetic). You may also be given a medicine to help you relax (sedative).  Small monitors will be connected to your body. They will be used to check your heart, blood pressure, and oxygen level.  A breathing tube may be placed into your lungs during the procedure.  A thin, flexible tube (catheter) will be placed into your bladder to drain urine.  A tube may be inserted through your nose and into your stomach (nasogastric tube, or NG tube). The tube is used to remove stomach fluids after surgery until the intestines start working again.  An incision will be made in your abdomen.  Clamps or staples will be put on your colon.  The part of the colon between the clamps  or staples will be removed.  The ends of the colon that remain will be stitched or stapled together.  The incision in your abdomen will be closed with stitches (sutures) or staples.  The incision will be covered with a bandage (dressing).  A small opening (stoma) may be created in your lower abdomen. A removable, external pouch (ostomy pouch) will be attached to the stoma. This pouch will collect stool outside of your body. Stool passes through the stoma and into the pouch instead of through your anus. The procedure may vary among health care providers and hospitals. What happens after the procedure?  Your blood pressure, heart rate, breathing rate, and blood oxygen level will be monitored until the medicines you were given have worn off.  You may continue to receive fluids and medicines through an IV tube.  You will start  on a clear liquid diet and gradually go back to a normal diet.  Do not drive until your health care provider approves.  You may have some pain in your abdomen. You will be given pain medicine to control the pain.  You will be encouraged to do the following:  Do breathing exercises to prevent pneumonia.  Get up and start walking within a day after surgery. You should try to get up 5-6 times a day. This information is not intended to replace advice given to you by your health care provider. Make sure you discuss any questions you have with your health care provider. Document Released: 06/01/2009 Document Revised: 05/05/2016 Document Reviewed: 05/05/2016 Elsevier Interactive Patient Education  2017 [employer]   Laparoscopic Colectomy Laparoscopic colectomy is surgery to remove part or all of the large intestine (colon). This procedure may be used to treat several conditions, including:  Inflammation and infection of the colon (diverticulitis).  Tumors or masses in the colon.  Inflammatory bowel disease, such as Crohn disease or ulcerative colitis. Colectomy is an option when symptoms cannot be controlled with medicines.  Bleeding from the colon that cannot be controlled by another method.  Blockage or obstruction of the colon. Tell a health care provider about:  Any allergies you have.  All medicines you are taking, including vitamins, herbs, eye drops, creams, and over-the-counter medicines.  Any problems you or family members have had with anesthetic medicines.  Any blood disorders you have.  Any surgeries you have had.  Any medical conditions you have. What are the risks? Generally, this is a safe procedure. However, problems may occur, including:  Infection.  Bleeding.  Allergic reactions to medicines or dyes.  Damage to other structures or organs.  Leaking from where the colon was sewn together.  Future blockage of the small intestines from scar tissue.  Another surgery may be needed to repair this.  Needing to convert to an open procedure. Complications such as damage to other organs or excessive bleeding may require the surgeon to convert from a laparoscopic procedure to an open procedure. This involves making a larger incision in the abdomen. What happens before the procedure? Staying hydrated  Follow instructions from your health care provider about hydration, which may include:  Up to 2 hours before the procedure - you may continue to drink clear liquids, such as water, clear fruit juice, black coffee, and plain tea. Eating and drinking restrictions  Follow instructions from your health care provider about eating and drinking, which may include:  8 hours before the procedure - stop eating heavy meals, meals with high fiber, or foods such as meat,  fried foods, or fatty foods.  6 hours before the procedure - stop eating light meals or foods, such as toast or cereal.  6 hours before the procedure - stop drinking milk or drinks that contain milk.  2 hours before the procedure - stop drinking clear liquids. Medicines  Ask your health care provider about:  Changing or stopping your regular medicines. This is especially important if you are taking diabetes medicines or blood thinners.  Taking medicines such as aspirin and ibuprofen. These medicines can thin your blood. Do not take these medicines before your procedure if your health care provider instructs you not to.  You may be given antibiotic medicine to clean out bacteria from your colon. Follow the directions carefully and take the medicine at the correct time. General instructions  You may be prescribed an oral bowel prep to clean out your colon in preparation for the surgery:  Follow instructions from your health care provider about how to do this.  Do not eat or drink anything else after you have started the bowel prep, unless your health care provider tells you it is safe to  do so.  Do not use any products that contain nicotine or tobacco, such as cigarettes and e-cigarettes. If you need help quitting, ask your health care provider. What happens during the procedure?  To reduce your risk of infection:  Your health care team will wash or sanitize their hands.  Your skin will be washed with soap.  An IV tube will be inserted into one of your veins to deliver fluid and medication.  You will be given one of the following:  A medicine to help you relax (sedative).  A medicine to make you fall asleep (general anesthetic).  Small monitors will be connected to your body. They will be used to check your heart, blood pressure, and oxygen level.  A breathing tube may be placed into your lungs during the procedure.  A thin, flexible tube (catheter) will be placed into your bladder to drain urine.  A tube may be placed through your nose and into your stomach to drain stomach fluids (nasogastric tube, or NG tube).  Your abdomen will be filled with air so it expands. This gives the surgeon more room to operate and makes your organs easier to see.  Several small cuts (incisions) will be made in your abdomen.  A thin, lighted tube with a tiny camera on the end (laparoscope) will be put through one of the small incisions. The camera on the laparoscope will send a picture to a computer screen in the operating room. This will give the surgeon a good view inside your abdomen.  Hollow tubes will be put through the other small incisions in your abdomen. The tools that are needed for the procedure will be put through these tubes.  Clamps or staples will be put on both ends of the diseased part of the colon.  The part of the intestine between the clamps or staples will be removed.  If possible, the ends of the healthy colon that remain will be stitched (sutured) or stapled together to allow your body to pass waste (stool).  Sometimes, the remaining colon cannot be  stitched back together. If this is the case, a colostomy will be needed. If you need a colostomy:  An opening to the outside of your body (stoma) will be made through your abdomen.  The end of your colon will be brought to the opening. It will be stitched  to the skin.  A bag will be attached to the opening. Stool will drain into this removable bag.  The colostomy may be temporary or permanent.  The incisions from the colectomy will be closed with sutures or staples. The procedure may vary among health care providers and hospitals. What happens after the procedure?  Your blood pressure, heart rate, breathing rate, and blood oxygen level will be monitored until the medicines you were given have worn off.  You will receive fluids through an IV tube until your bowels start to work properly.  Once your bowels are working again, you will be given clear liquids first and then solid food as tolerated.  You will be given medicines to control your pain and nausea, if needed.  Do not drive for 24 hours if you were given a sedative. This information is not intended to replace advice given to you by your health care provider. Make sure you discuss any questions you have with your health care provider. Document Released: 10/25/2002 Document Revised: 05/05/2016 Document Reviewed: 05/05/2016 Elsevier Interactive Patient Education  2017 [employer].

## 2016-10-06 ENCOUNTER — Encounter (HOSPITAL_COMMUNITY)
Admission: RE | Admit: 2016-10-06 | Discharge: 2016-10-06 | Disposition: A | Discharge status: Home / Self Care | Payer: Medicare Other | Source: Ambulatory Visit | Attending: General Surgery | Admitting: General Surgery

## 2016-10-06 ENCOUNTER — Ambulatory Visit (HOSPITAL_COMMUNITY): Payer: Medicare Other

## 2016-10-07 NOTE — Telephone Encounter (Signed)
Wanda Curry from pre-service center called this morning, pt is scheduled for CT abd/pelvis with contrast tomorrow and needed PA. Called pre-certification number on back of [employer] card. Was then advised to call [facility] at [phone]. Called [facility], was informed that no authorization is needed, pt may proceed. Ref# [id number]

## 2016-10-07 NOTE — Telephone Encounter (Signed)
Called Pam at pre-service center and informed no PA was needed, gave her ref#.

## 2016-10-08 ENCOUNTER — Ambulatory Visit (HOSPITAL_COMMUNITY)
Admission: RE | Admit: 2016-10-08 | Discharge: 2016-10-08 | Disposition: A | Discharge status: Home / Self Care | Payer: Medicare Other | Source: Ambulatory Visit | Attending: Gastroenterology | Admitting: Gastroenterology

## 2016-10-08 ENCOUNTER — Encounter (HOSPITAL_COMMUNITY): Payer: Self-pay

## 2016-10-08 ENCOUNTER — Encounter (HOSPITAL_COMMUNITY)
Admission: RE | Admit: 2016-10-08 | Discharge: 2016-10-08 | Disposition: A | Discharge status: Home / Self Care | Payer: Medicare Other | Source: Ambulatory Visit | Attending: General Surgery | Admitting: General Surgery

## 2016-10-08 DIAGNOSIS — I700 Atherosclerosis of aorta: Secondary | ICD-10-CM | POA: Insufficient documentation

## 2016-10-08 DIAGNOSIS — Z90710 Acquired absence of both cervix and uterus: Secondary | ICD-10-CM | POA: Insufficient documentation

## 2016-10-08 DIAGNOSIS — C189 Malignant neoplasm of colon, unspecified: Secondary | ICD-10-CM

## 2016-10-08 DIAGNOSIS — R911 Solitary pulmonary nodule: Secondary | ICD-10-CM | POA: Insufficient documentation

## 2016-10-08 DIAGNOSIS — K5730 Diverticulosis of large intestine without perforation or abscess without bleeding: Secondary | ICD-10-CM | POA: Insufficient documentation

## 2016-10-08 DIAGNOSIS — D490 Neoplasm of unspecified behavior of digestive system: Secondary | ICD-10-CM

## 2016-10-08 LAB — CBC WITH DIFFERENTIAL/PLATELET
Basophils Absolute: 0 10*3/uL (ref 0.0–0.1)
Basophils Relative: 0 %
Eosinophils Absolute: 0.2 10*3/uL (ref 0.0–0.7)
Eosinophils Relative: 2 %
HCT: 32.2 % — ABNORMAL LOW (ref 36.0–46.0)
Hemoglobin: 10.5 g/dL — ABNORMAL LOW (ref 12.0–15.0)
Lymphocytes Relative: 30 %
Lymphs Abs: 2.5 10*3/uL (ref 0.7–4.0)
MCH: 27 pg (ref 26.0–34.0)
MCHC: 32.6 g/dL (ref 30.0–36.0)
MCV: 82.8 fL (ref 78.0–100.0)
Monocytes Absolute: 0.4 10*3/uL (ref 0.1–1.0)
Monocytes Relative: 5 %
Neutro Abs: 5.2 10*3/uL (ref 1.7–7.7)
Neutrophils Relative %: 63 %
Platelets: 300 10*3/uL (ref 150–400)
RBC: 3.89 MIL/uL (ref 3.87–5.11)
RDW: 16.5 % — ABNORMAL HIGH (ref 11.5–15.5)
WBC: 8.2 10*3/uL (ref 4.0–10.5)

## 2016-10-08 LAB — COMPREHENSIVE METABOLIC PANEL
ALT: 23 U/L (ref 14–54)
AST: 25 U/L (ref 15–41)
Albumin: 3.5 g/dL (ref 3.5–5.0)
Alkaline Phosphatase: 50 U/L (ref 38–126)
Anion gap: 8 (ref 5–15)
BUN: 24 mg/dL — ABNORMAL HIGH (ref 6–20)
CO2: 26 mmol/L (ref 22–32)
Calcium: 9 mg/dL (ref 8.9–10.3)
Chloride: 99 mmol/L — ABNORMAL LOW (ref 101–111)
Creatinine, Ser: 1.05 mg/dL — ABNORMAL HIGH (ref 0.44–1.00)
GFR calc Af Amer: 60 mL/min — ABNORMAL LOW (ref >60)
GFR calc non Af Amer: 52 mL/min — ABNORMAL LOW (ref >60)
Glucose, Bld: 228 mg/dL — ABNORMAL HIGH (ref 65–99)
Potassium: 3.8 mmol/L (ref 3.5–5.1)
Sodium: 133 mmol/L — ABNORMAL LOW (ref 135–145)
Total Bilirubin: 0.6 mg/dL (ref 0.3–1.2)
Total Protein: 6.9 g/dL (ref 6.5–8.1)

## 2016-10-08 LAB — ABO/RH: ABO/RH(D): O POS

## 2016-10-08 LAB — PREPARE RBC (CROSSMATCH)

## 2016-10-08 MED ORDER — IOPAMIDOL (ISOVUE-300) INJECTION 61%
100.0000 mL | Freq: Once | INTRAVENOUS | Status: AC | PRN
  Administered 2016-10-08: 100 mL via INTRAVENOUS

## 2016-10-09 LAB — CEA: CEA: 3.6 ng/mL (ref 0.0–4.7)

## 2016-10-09 NOTE — Anesthesia Preprocedure Evaluation (Signed)
Anesthesia Evaluation  Patient identified by MRN, date of birth, ID band Patient awake    Reviewed: Allergy & Precautions, NPO status , Patient's Chart, lab work & pertinent test results, reviewed documented beta blocker date and time   History of Anesthesia Complications (+) PONV and history of anesthetic complications  Airway Mallampati: II  TM Distance: >3 FB     Dental  (+) Edentulous Upper, Edentulous Lower   Pulmonary former smoker,    breath sounds clear to auscultation       Cardiovascular negative cardio ROS   Rhythm:Regular Rate:Normal     Neuro/Psych PSYCHIATRIC DISORDERS Depression    GI/Hepatic GERD  ,  Endo/Other  diabetes, Type 2, Insulin DependentMorbid obesity  Renal/GU      Musculoskeletal  (+) Arthritis , Fibromyalgia -  Abdominal   Peds  Hematology   Anesthesia Other Findings   Reproductive/Obstetrics                             Anesthesia Physical Anesthesia Plan  ASA: III  Anesthesia Plan: General   Post-op Pain Management:    Induction: Intravenous, Rapid sequence and Cricoid pressure planned  Airway Management Planned: Oral ETT  Additional Equipment:   Intra-op Plan:   Post-operative Plan: Extubation in OR  Informed Consent: I have reviewed the patients History and Physical, chart, labs and discussed the procedure including the risks, benefits and alternatives for the proposed anesthesia with the patient or authorized representative who has indicated his/her understanding and acceptance.     Plan Discussed with:   Anesthesia Plan Comments:         Anesthesia Quick Evaluation

## 2016-10-10 ENCOUNTER — Inpatient Hospital Stay (HOSPITAL_COMMUNITY): Payer: Medicare Other | Admitting: Anesthesiology

## 2016-10-10 ENCOUNTER — Encounter (HOSPITAL_COMMUNITY)
Admission: RE | Disposition: A | Discharge status: Home / Self Care | Payer: Self-pay | Source: Ambulatory Visit | Attending: General Surgery

## 2016-10-10 ENCOUNTER — Encounter (HOSPITAL_COMMUNITY): Payer: Self-pay

## 2016-10-10 ENCOUNTER — Inpatient Hospital Stay (HOSPITAL_COMMUNITY)
Admission: RE | Admit: 2016-10-10 | Discharge: 2016-10-13 | DRG: 330 | Disposition: A | Discharge status: Home / Self Care | Payer: Medicare Other | Source: Ambulatory Visit | Attending: General Surgery | Admitting: General Surgery

## 2016-10-10 DIAGNOSIS — Z823 Family history of stroke: Secondary | ICD-10-CM

## 2016-10-10 DIAGNOSIS — Z87891 Personal history of nicotine dependence: Secondary | ICD-10-CM

## 2016-10-10 DIAGNOSIS — I10 Essential (primary) hypertension: Secondary | ICD-10-CM | POA: Diagnosis present

## 2016-10-10 DIAGNOSIS — Z96651 Presence of right artificial knee joint: Secondary | ICD-10-CM | POA: Diagnosis present

## 2016-10-10 DIAGNOSIS — K219 Gastro-esophageal reflux disease without esophagitis: Secondary | ICD-10-CM | POA: Diagnosis present

## 2016-10-10 DIAGNOSIS — C183 Malignant neoplasm of hepatic flexure: Principal | ICD-10-CM | POA: Diagnosis present

## 2016-10-10 DIAGNOSIS — Z825 Family history of asthma and other chronic lower respiratory diseases: Secondary | ICD-10-CM | POA: Diagnosis not present

## 2016-10-10 DIAGNOSIS — E119 Type 2 diabetes mellitus without complications: Secondary | ICD-10-CM | POA: Diagnosis present

## 2016-10-10 DIAGNOSIS — C189 Malignant neoplasm of colon, unspecified: Secondary | ICD-10-CM | POA: Diagnosis present

## 2016-10-10 DIAGNOSIS — M797 Fibromyalgia: Secondary | ICD-10-CM | POA: Diagnosis present

## 2016-10-10 DIAGNOSIS — Z794 Long term (current) use of insulin: Secondary | ICD-10-CM | POA: Diagnosis not present

## 2016-10-10 DIAGNOSIS — Z6841 Body Mass Index (BMI) 40.0 and over, adult: Secondary | ICD-10-CM

## 2016-10-10 DIAGNOSIS — Z90710 Acquired absence of both cervix and uterus: Secondary | ICD-10-CM | POA: Diagnosis not present

## 2016-10-10 DIAGNOSIS — E7800 Pure hypercholesterolemia, unspecified: Secondary | ICD-10-CM | POA: Diagnosis present

## 2016-10-10 HISTORY — PX: PARTIAL COLECTOMY: SHX5273

## 2016-10-10 LAB — GLUCOSE, CAPILLARY
Glucose-Capillary: 121 mg/dL — ABNORMAL HIGH (ref 65–99)
Glucose-Capillary: 118 mg/dL — ABNORMAL HIGH (ref 65–99)
Glucose-Capillary: 126 mg/dL — ABNORMAL HIGH (ref 65–99)
Glucose-Capillary: 115 mg/dL — ABNORMAL HIGH (ref 65–99)
Glucose-Capillary: 140 mg/dL — ABNORMAL HIGH (ref 65–99)

## 2016-10-10 SURGERY — COLECTOMY, PARTIAL
Anesthesia: General

## 2016-10-10 MED ORDER — ROPINIROLE HCL 1 MG PO TABS
1.0000 mg | ORAL_TABLET | Freq: Three times a day (TID) | ORAL | Status: DC
  Administered 2016-10-10 – 2016-10-13 (×8): 1 mg via ORAL
  Filled 2016-10-10 (×9): qty 1

## 2016-10-10 MED ORDER — ARTIFICIAL TEARS OP OINT
TOPICAL_OINTMENT | OPHTHALMIC | Status: DC | PRN
  Administered 2016-10-10: 1 via OPHTHALMIC

## 2016-10-10 MED ORDER — ENOXAPARIN SODIUM 40 MG/0.4ML ~~LOC~~ SOLN
SUBCUTANEOUS | Status: AC
  Filled 2016-10-10: qty 0.4

## 2016-10-10 MED ORDER — NEOSTIGMINE METHYLSULFATE 10 MG/10ML IV SOLN
INTRAVENOUS | Status: DC | PRN
  Administered 2016-10-10: 4 mg via INTRAVENOUS

## 2016-10-10 MED ORDER — PROPOFOL 10 MG/ML IV BOLUS
INTRAVENOUS | Status: AC
  Filled 2016-10-10: qty 40

## 2016-10-10 MED ORDER — FENTANYL CITRATE (PF) 250 MCG/5ML IJ SOLN
INTRAMUSCULAR | Status: AC
  Filled 2016-10-10: qty 5

## 2016-10-10 MED ORDER — MIDAZOLAM HCL 2 MG/2ML IJ SOLN
1.0000 mg | INTRAMUSCULAR | Status: DC
  Administered 2016-10-10: 2 mg via INTRAVENOUS

## 2016-10-10 MED ORDER — DIPHENHYDRAMINE HCL 12.5 MG/5ML PO ELIX: 12.5000 mg | ORAL_SOLUTION | Freq: Four times a day (QID) | ORAL | Status: DC | PRN

## 2016-10-10 MED ORDER — EPHEDRINE SULFATE 50 MG/ML IJ SOLN
INTRAMUSCULAR | Status: DC | PRN
  Administered 2016-10-10 (×3): 10 mg via INTRAVENOUS

## 2016-10-10 MED ORDER — ALVIMOPAN 12 MG PO CAPS
12.0000 mg | ORAL_CAPSULE | Freq: Once | ORAL | Status: AC
  Administered 2016-10-10: 12 mg via ORAL

## 2016-10-10 MED ORDER — LACTATED RINGERS IV SOLN
INTRAVENOUS | Status: DC
  Administered 2016-10-10: 07:00:00 via INTRAVENOUS

## 2016-10-10 MED ORDER — HYDROMORPHONE HCL 1 MG/ML IJ SOLN
1.0000 mg | INTRAMUSCULAR | Status: DC | PRN
  Administered 2016-10-11 – 2016-10-12 (×2): 1 mg via INTRAVENOUS
  Filled 2016-10-10 (×2): qty 1

## 2016-10-10 MED ORDER — KETOROLAC TROMETHAMINE 30 MG/ML IJ SOLN
30.0000 mg | Freq: Once | INTRAMUSCULAR | Status: AC
  Administered 2016-10-10: 30 mg via INTRAVENOUS
  Filled 2016-10-10: qty 1

## 2016-10-10 MED ORDER — MILNACIPRAN HCL 50 MG PO TABS
100.0000 mg | ORAL_TABLET | Freq: Two times a day (BID) | ORAL | Status: DC
  Administered 2016-10-10 – 2016-10-13 (×6): 100 mg via ORAL
  Filled 2016-10-10 (×11): qty 2

## 2016-10-10 MED ORDER — ALVIMOPAN 12 MG PO CAPS
12.0000 mg | ORAL_CAPSULE | Freq: Two times a day (BID) | ORAL | Status: DC
  Administered 2016-10-11 – 2016-10-13 (×5): 12 mg via ORAL
  Filled 2016-10-10 (×5): qty 1

## 2016-10-10 MED ORDER — HYDROMORPHONE HCL 1 MG/ML IJ SOLN
INTRAMUSCULAR | Status: AC
  Filled 2016-10-10: qty 0.5

## 2016-10-10 MED ORDER — SIMETHICONE 80 MG PO CHEW: 40.0000 mg | CHEWABLE_TABLET | Freq: Four times a day (QID) | ORAL | Status: DC | PRN

## 2016-10-10 MED ORDER — ENOXAPARIN SODIUM 40 MG/0.4ML ~~LOC~~ SOLN
40.0000 mg | Freq: Once | SUBCUTANEOUS | Status: AC
  Administered 2016-10-10: 40 mg via SUBCUTANEOUS

## 2016-10-10 MED ORDER — ONDANSETRON HCL 4 MG/2ML IJ SOLN: 4.0000 mg | Freq: Four times a day (QID) | INTRAMUSCULAR | Status: DC | PRN

## 2016-10-10 MED ORDER — FENTANYL CITRATE (PF) 100 MCG/2ML IJ SOLN
INTRAMUSCULAR | Status: AC
  Filled 2016-10-10: qty 2

## 2016-10-10 MED ORDER — ACETAMINOPHEN 325 MG PO TABS: 650.0000 mg | ORAL_TABLET | Freq: Four times a day (QID) | ORAL | Status: DC | PRN

## 2016-10-10 MED ORDER — GLYCOPYRROLATE 0.2 MG/ML IJ SOLN
INTRAMUSCULAR | Status: AC
  Filled 2016-10-10: qty 3

## 2016-10-10 MED ORDER — GABAPENTIN 300 MG PO CAPS
600.0000 mg | ORAL_CAPSULE | Freq: Three times a day (TID) | ORAL | Status: DC
  Administered 2016-10-10 – 2016-10-13 (×8): 600 mg via ORAL
  Filled 2016-10-10 (×9): qty 2

## 2016-10-10 MED ORDER — ALUM & MAG HYDROXIDE-SIMETH 200-200-20 MG/5ML PO SUSP: 30.0000 mL | Freq: Four times a day (QID) | ORAL | Status: DC | PRN

## 2016-10-10 MED ORDER — BUPIVACAINE LIPOSOME 1.3 % IJ SUSP
INTRAMUSCULAR | Status: DC | PRN
  Administered 2016-10-10: 20 mL

## 2016-10-10 MED ORDER — BUPIVACAINE LIPOSOME 1.3 % IJ SUSP
INTRAMUSCULAR | Status: AC
  Filled 2016-10-10: qty 20

## 2016-10-10 MED ORDER — SODIUM CHLORIDE 0.9 % IV SOLN
1.0000 g | INTRAVENOUS | Status: AC
  Administered 2016-10-10: 1 g via INTRAVENOUS

## 2016-10-10 MED ORDER — FENTANYL CITRATE (PF) 100 MCG/2ML IJ SOLN
INTRAMUSCULAR | Status: AC
Start: 2016-10-10 — End: 2016-10-10
  Filled 2016-10-10: qty 2

## 2016-10-10 MED ORDER — HYDROMORPHONE HCL 1 MG/ML IJ SOLN
0.2500 mg | INTRAMUSCULAR | Status: DC | PRN
  Administered 2016-10-10 (×5): 0.5 mg via INTRAVENOUS
  Filled 2016-10-10 (×4): qty 0.5

## 2016-10-10 MED ORDER — LORAZEPAM 2 MG/ML IJ SOLN: 1.0000 mg | INTRAMUSCULAR | Status: DC | PRN

## 2016-10-10 MED ORDER — ROCURONIUM BROMIDE 100 MG/10ML IV SOLN
INTRAVENOUS | Status: DC | PRN
  Administered 2016-10-10: 35 mg via INTRAVENOUS
  Administered 2016-10-10: 10 mg via INTRAVENOUS
  Administered 2016-10-10: 5 mg via INTRAVENOUS

## 2016-10-10 MED ORDER — ACETAMINOPHEN 650 MG RE SUPP: 650.0000 mg | Freq: Four times a day (QID) | RECTAL | Status: DC | PRN

## 2016-10-10 MED ORDER — FENTANYL CITRATE (PF) 100 MCG/2ML IJ SOLN
INTRAMUSCULAR | Status: DC | PRN
  Administered 2016-10-10 (×5): 50 ug via INTRAVENOUS
  Administered 2016-10-10: 25 ug via INTRAVENOUS

## 2016-10-10 MED ORDER — SODIUM CHLORIDE 0.9 % IV SOLN
INTRAVENOUS | Status: AC
  Filled 2016-10-10: qty 1

## 2016-10-10 MED ORDER — ONDANSETRON 4 MG PO TBDP
4.0000 mg | ORAL_TABLET | Freq: Four times a day (QID) | ORAL | Status: DC | PRN
  Filled 2016-10-10: qty 1

## 2016-10-10 MED ORDER — ONDANSETRON HCL 4 MG/2ML IJ SOLN
INTRAMUSCULAR | Status: AC
  Filled 2016-10-10: qty 2

## 2016-10-10 MED ORDER — LIDOCAINE HCL (CARDIAC) 20 MG/ML IV SOLN
INTRAVENOUS | Status: DC | PRN
  Administered 2016-10-10: 30 mg via INTRAVENOUS

## 2016-10-10 MED ORDER — ENOXAPARIN SODIUM 40 MG/0.4ML ~~LOC~~ SOLN
40.0000 mg | SUBCUTANEOUS | Status: DC
  Administered 2016-10-11 – 2016-10-13 (×3): 40 mg via SUBCUTANEOUS
  Filled 2016-10-10 (×3): qty 0.4

## 2016-10-10 MED ORDER — DIPHENHYDRAMINE HCL 50 MG/ML IJ SOLN: 12.5000 mg | Freq: Four times a day (QID) | INTRAMUSCULAR | Status: DC | PRN

## 2016-10-10 MED ORDER — FOLIC ACID 1 MG PO TABS
1.0000 mg | ORAL_TABLET | Freq: Every day | ORAL | Status: DC
  Administered 2016-10-11 – 2016-10-13 (×3): 1 mg via ORAL
  Filled 2016-10-10 (×3): qty 1

## 2016-10-10 MED ORDER — LACTATED RINGERS IV SOLN
INTRAVENOUS | Status: DC
  Administered 2016-10-10 – 2016-10-11 (×2): via INTRAVENOUS

## 2016-10-10 MED ORDER — CHLORHEXIDINE GLUCONATE CLOTH 2 % EX PADS: 6.0000 | MEDICATED_PAD | Freq: Once | CUTANEOUS | Status: DC

## 2016-10-10 MED ORDER — LACTATED RINGERS IV SOLN
INTRAVENOUS | Status: DC | PRN
  Administered 2016-10-10 (×3): via INTRAVENOUS

## 2016-10-10 MED ORDER — GLYCOPYRROLATE 0.2 MG/ML IJ SOLN
INTRAMUSCULAR | Status: DC | PRN
  Administered 2016-10-10: 0.2 mg via INTRAVENOUS
  Administered 2016-10-10: 0.6 mg via INTRAVENOUS

## 2016-10-10 MED ORDER — ARTIFICIAL TEARS OP OINT
TOPICAL_OINTMENT | OPHTHALMIC | Status: AC
  Filled 2016-10-10: qty 7

## 2016-10-10 MED ORDER — NEOSTIGMINE METHYLSULFATE 10 MG/10ML IV SOLN
INTRAVENOUS | Status: AC
  Filled 2016-10-10: qty 1

## 2016-10-10 MED ORDER — HYDROCHLOROTHIAZIDE 25 MG PO TABS
25.0000 mg | ORAL_TABLET | Freq: Every day | ORAL | Status: DC
  Administered 2016-10-11 – 2016-10-13 (×3): 25 mg via ORAL
  Filled 2016-10-10 (×3): qty 1

## 2016-10-10 MED ORDER — 0.9 % SODIUM CHLORIDE (POUR BTL) OPTIME
TOPICAL | Status: DC | PRN
  Administered 2016-10-10: 1000 mL

## 2016-10-10 MED ORDER — ONDANSETRON HCL 4 MG/2ML IJ SOLN
4.0000 mg | Freq: Once | INTRAMUSCULAR | Status: AC
  Administered 2016-10-10: 4 mg via INTRAVENOUS

## 2016-10-10 MED ORDER — HYDROCODONE-ACETAMINOPHEN 5-325 MG PO TABS
1.0000 | ORAL_TABLET | ORAL | Status: DC | PRN
  Administered 2016-10-10 – 2016-10-11 (×3): 2 via ORAL
  Administered 2016-10-12 (×2): 1 via ORAL
  Administered 2016-10-13: 2 via ORAL
  Filled 2016-10-10: qty 1
  Filled 2016-10-10 (×2): qty 2
  Filled 2016-10-10: qty 1
  Filled 2016-10-10 (×2): qty 2

## 2016-10-10 MED ORDER — INSULIN ASPART 100 UNIT/ML ~~LOC~~ SOLN
0.0000 [IU] | Freq: Three times a day (TID) | SUBCUTANEOUS | Status: DC
  Administered 2016-10-10 – 2016-10-11 (×2): 2 [IU] via SUBCUTANEOUS
  Administered 2016-10-11: 3 [IU] via SUBCUTANEOUS
  Administered 2016-10-12: 8 [IU] via SUBCUTANEOUS
  Administered 2016-10-12: 2 [IU] via SUBCUTANEOUS
  Administered 2016-10-12 – 2016-10-13 (×2): 3 [IU] via SUBCUTANEOUS

## 2016-10-10 MED ORDER — PROPOFOL 10 MG/ML IV BOLUS
INTRAVENOUS | Status: DC | PRN
  Administered 2016-10-10: 100 mg via INTRAVENOUS

## 2016-10-10 MED ORDER — PHENYLEPHRINE 40 MCG/ML (10ML) SYRINGE FOR IV PUSH (FOR BLOOD PRESSURE SUPPORT)
PREFILLED_SYRINGE | INTRAVENOUS | Status: AC
  Filled 2016-10-10: qty 10

## 2016-10-10 MED ORDER — POVIDONE-IODINE 10 % OINT PACKET
TOPICAL_OINTMENT | CUTANEOUS | Status: DC | PRN
  Administered 2016-10-10: 1 via TOPICAL

## 2016-10-10 MED ORDER — POVIDONE-IODINE 10 % EX OINT
TOPICAL_OINTMENT | CUTANEOUS | Status: AC
  Filled 2016-10-10: qty 1

## 2016-10-10 MED ORDER — ALVIMOPAN 12 MG PO CAPS
ORAL_CAPSULE | ORAL | Status: AC
  Filled 2016-10-10: qty 1

## 2016-10-10 MED ORDER — MIDAZOLAM HCL 2 MG/2ML IJ SOLN
INTRAMUSCULAR | Status: AC
  Filled 2016-10-10: qty 2

## 2016-10-10 MED ORDER — CARVEDILOL 3.125 MG PO TABS
6.2500 mg | ORAL_TABLET | Freq: Two times a day (BID) | ORAL | Status: DC
  Administered 2016-10-10 – 2016-10-13 (×6): 6.25 mg via ORAL
  Filled 2016-10-10 (×6): qty 2

## 2016-10-10 SURGICAL SUPPLY — 70 items
APPLIER CLIP 11 MED OPEN (CLIP)
APPLIER CLIP 13 LRG OPEN (CLIP)
BAG HAMPER (MISCELLANEOUS) ×3 IMPLANT
BARRIER SKIN 2 3/4 (OSTOMY) IMPLANT
BARRIER SKIN 2 3/4 INCH (OSTOMY)
CELLS DAT CNTRL 66122 CELL SVR (MISCELLANEOUS) IMPLANT
CHLORAPREP W/TINT 26ML (MISCELLANEOUS) ×3 IMPLANT
CLAMP POUCH DRAINAGE QUIET (OSTOMY) IMPLANT
CLIP APPLIE 11 MED OPEN (CLIP) IMPLANT
CLIP APPLIE 13 LRG OPEN (CLIP) IMPLANT
CLOTH BEACON ORANGE TIMEOUT ST (SAFETY) ×3 IMPLANT
COVER LIGHT HANDLE STERIS (MISCELLANEOUS) ×6 IMPLANT
COVER MAYO STAND XLG (DRAPE) ×3 IMPLANT
DRAPE UTILITY W/TAPE 26X15 (DRAPES) ×6 IMPLANT
DRAPE WARM FLUID 44X44 (DRAPE) ×3 IMPLANT
DRSG OPSITE POSTOP 4X10 (GAUZE/BANDAGES/DRESSINGS) ×3 IMPLANT
DRSG OPSITE POSTOP 4X8 (GAUZE/BANDAGES/DRESSINGS) IMPLANT
ELECT BLADE 6 FLAT ULTRCLN (ELECTRODE) IMPLANT
ELECT REM PT RETURN 9FT ADLT (ELECTROSURGICAL) ×3
ELECTRODE REM PT RTRN 9FT ADLT (ELECTROSURGICAL) ×1 IMPLANT
FORMALIN 10 PREFIL 480ML (MISCELLANEOUS) IMPLANT
GLOVE BIOGEL PI IND STRL 7.0 (GLOVE) ×1 IMPLANT
GLOVE BIOGEL PI INDICATOR 7.0 (GLOVE) ×2
GLOVE SURG SS PI 7.5 STRL IVOR (GLOVE) ×9 IMPLANT
GOWN STRL REUS W/ TWL XL LVL3 (GOWN DISPOSABLE) ×2 IMPLANT
GOWN STRL REUS W/TWL LRG LVL3 (GOWN DISPOSABLE) ×12 IMPLANT
GOWN STRL REUS W/TWL XL LVL3 (GOWN DISPOSABLE) ×4
HANDLE SUCTION POOLE (INSTRUMENTS) ×1 IMPLANT
INST SET MAJOR GENERAL (KITS) ×3 IMPLANT
KIT BLADEGUARD II DBL (SET/KITS/TRAYS/PACK) ×3 IMPLANT
KIT ROOM TURNOVER APOR (KITS) ×3 IMPLANT
MANIFOLD NEPTUNE II (INSTRUMENTS) ×3 IMPLANT
NEEDLE HYPO 18GX1.5 BLUNT FILL (NEEDLE) ×3 IMPLANT
NEEDLE HYPO 21X1.5 SAFETY (NEEDLE) ×3 IMPLANT
NS IRRIG 1000ML POUR BTL (IV SOLUTION) ×6 IMPLANT
PACK ABDOMINAL MAJOR (CUSTOM PROCEDURE TRAY) ×3 IMPLANT
PAD ARMBOARD 7.5X6 YLW CONV (MISCELLANEOUS) ×3 IMPLANT
PENCIL HANDSWITCHING (ELECTRODE) ×3 IMPLANT
POUCH OSTOMY 2 3/4  H 3804 (WOUND CARE)
POUCH OSTOMY 2 PC DRNBL 2.25 (WOUND CARE) IMPLANT
POUCH OSTOMY 2 PC DRNBL 2.75 (WOUND CARE) IMPLANT
POUCH OSTOMY DRNBL 2 1/4 (WOUND CARE)
RELOAD LINEAR CUT PROX 55 BLUE (ENDOMECHANICALS) IMPLANT
RELOAD PROXIMATE 75MM BLUE (ENDOMECHANICALS) ×15 IMPLANT
RELOAD PROXIMATE TA60MM BLUE (ENDOMECHANICALS) ×3 IMPLANT
RETRACTOR WND ALEXIS 25 LRG (MISCELLANEOUS) ×1 IMPLANT
RTRCTR WOUND ALEXIS 18CM MED (MISCELLANEOUS)
RTRCTR WOUND ALEXIS 25CM LRG (MISCELLANEOUS) ×3
SEALER TISSUE X1 CVD JAW (INSTRUMENTS) ×3 IMPLANT
SET BASIN LINEN APH (SET/KITS/TRAYS/PACK) ×3 IMPLANT
SPONGE LAP 18X18 X RAY DECT (DISPOSABLE) ×3 IMPLANT
STAPLER GUN LINEAR PROX 60 (STAPLE) ×6 IMPLANT
STAPLER PROXIMATE 55 BLUE (STAPLE) IMPLANT
STAPLER PROXIMATE 75MM BLUE (STAPLE) ×3 IMPLANT
STAPLER VISISTAT (STAPLE) ×3 IMPLANT
SUCTION POOLE HANDLE (INSTRUMENTS) ×3
SUCTION YANKAUER HANDLE (MISCELLANEOUS) ×3 IMPLANT
SUT CHROMIC 0 SH (SUTURE) IMPLANT
SUT CHROMIC 2 0 SH (SUTURE) ×3 IMPLANT
SUT CHROMIC 3 0 SH 27 (SUTURE) IMPLANT
SUT NOVA NAB GS-26 0 60 (SUTURE) ×6 IMPLANT
SUT PDS AB 0 CTX 60 (SUTURE) IMPLANT
SUT SILK 2 0 (SUTURE)
SUT SILK 2 0 REEL (SUTURE) IMPLANT
SUT SILK 2-0 18XBRD TIE 12 (SUTURE) IMPLANT
SUT SILK 3 0 SH CR/8 (SUTURE) ×6 IMPLANT
SYR 20CC LL (SYRINGE) ×3 IMPLANT
TOWEL BLUE STERILE X RAY DET (MISCELLANEOUS) IMPLANT
TOWEL OR 17X26 4PK STRL BLUE (TOWEL DISPOSABLE) ×3 IMPLANT
TRAY FOLEY CATH SILVER 16FR (SET/KITS/TRAYS/PACK) ×3 IMPLANT

## 2016-10-10 NOTE — Anesthesia Postprocedure Evaluation (Signed)
Anesthesia Post Note  Patient: Wanda Curry  Procedure(s) Performed: Procedure(s) (LRB): PARTIAL COLECTOMY (N/A)  Patient location during evaluation: PACU Anesthesia Type: General Level of consciousness: awake and alert and oriented Pain management: pain level controlled Vital Signs Assessment: post-procedure vital signs reviewed and stable Respiratory status: spontaneous breathing Cardiovascular status: stable Postop Assessment: no signs of nausea or vomiting Anesthetic complications: no     Last Vitals:  Vitals:   10/10/16 0720 10/10/16 0931  BP: (!) 123/59 (!) 101/41  Pulse:  64  Resp: 18 14  Temp:  36.7 C    Last Pain:  Vitals:   10/10/16 1016  TempSrc:   PainSc: 7                  [name]

## 2016-10-10 NOTE — Anesthesia Procedure Notes (Addendum)
Procedure Name: Intubation Date/Time: 10/10/2016 7:37 AM Performed by: Andree Elk, AMY A Pre-anesthesia Checklist: Patient identified, Patient being monitored, Timeout performed, Emergency Drugs available and Suction available Patient Re-evaluated:Patient Re-evaluated prior to inductionOxygen Delivery Method: Circle System Utilized Preoxygenation: Pre-oxygenation with 100% oxygen Intubation Type: IV induction and Cricoid Pressure applied Ventilation: Mask ventilation without difficulty Laryngoscope Size: Miller and 3 Grade View: Grade I Tube type: Oral Tube size: 7.0 mm Number of attempts: 1 Airway Equipment and Method: Stylet Placement Confirmation: ETT inserted through vocal cords under direct vision,  positive ETCO2 and breath sounds checked- equal and bilateral Secured at: 21 cm Tube secured with: Tape Dental Injury: Teeth and Oropharynx as per pre-operative assessment

## 2016-10-10 NOTE — Op Note (Signed)
Patient:  Wanda Curry  DOB:  08-Mar-1946  MRN:  [id number]   Preop Diagnosis:  Colon carcinoma  Postop Diagnosis:  Same  Procedure:  Right hemicolectomy  Surgeon:  Aviva Signs, M.D.  Anes:  Gen. 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 The patient now presents for right hemicolectomy. The risks and benefits of the procedure including bleeding, infection, cardiopulmonary difficulties, the possibility of a colostomy, and the possibility of a blood transfusion were fully explained to the patient, who gave informed consent.  Procedure note:  The patient was placed in the supine position. After induction of general endotracheal anesthesia, the abdomen was prepped and draped using usual sterile technique with ChloraPrep. Surgical site confirmation was performed.  A midline incision was made from just above the umbilicus to the umbilical level. The peritoneal cavity was entered into without difficulty. The liver was palpated and no abnormal lesions were noted. CT scan preoperatively didn't show a 3 cm indeterminate mass, but was centrally located. The colon was palpated and a mass was noted at the hepatic flexure. A tattoo Teneisha Gignac was also present. The right colon was mobilized along its peritoneal reflection. A GIA stapler was placed across the terminal ileum and fired. This was likewise done at the proximal transverse colon. This was well distal to the colonic mass. The mesentery was then divided using the LigaSure. The transverse colon was inspected and good blood flow was noted to the mid transverse colon. A side-to-side ileocolic anastomosis was then performed using a GIA-75 stapler. After the initial anastomosis was performed, an abnormal twist of the small bowel was noted, thus this was resected and read done using a GIA-75 stapler. The enterotomy was closed using a TA 60 stapler. The staple  line was bolstered using 3-0 silk sutures. Surrounding omentum was placed over the anastomosis and secured to it using 3-0 silk sutures. The mesenteric defect was closed using a 2-0 chromic gut suture. The abdominal cavity was then copiously irrigated with normal saline. The bowel was returned into the abdominal cavity and orderly fashion. All operating personnel been changed her gown and gloves. A new setup was then used. The fascia was reapproximated using an looped 0 PDS running suture. The subcutaneous layer was irrigated with normal saline. The skin was injected with Exparel. The skin was closed using staples. Betadine ointment and a dry sterile dressing were applied.  All tape and needle counts were correct at the end of the procedure. The patient was extubated in the operating room and transferred to PACU in stable condition.  Complications:  None  EBL:  50 mL  Specimen:  Right colon, ileocolic anastomosis

## 2016-10-10 NOTE — Transfer of Care (Signed)
Immediate Anesthesia Transfer of Care Note  Patient: Shamikia Juhas  Procedure(s) Performed: Procedure(s): PARTIAL COLECTOMY (N/A)  Patient Location: PACU  Anesthesia Type:General  Level of Consciousness: awake, oriented and patient cooperative  Airway & Oxygen Therapy: Patient Spontanous Breathing and Patient connected to face mask oxygen  Post-op Assessment: Report given to RN and Post -op Vital signs reviewed and stable  Post vital signs: Reviewed and stable  Last Vitals:  Vitals:   10/10/16 0715 10/10/16 0720  BP: (!) 117/55 (!) 123/59  Pulse:    Resp: (!) 22 18  Temp:      Last Pain:  Vitals:   10/10/16 0647  TempSrc: Oral      Patients Stated Pain Goal: 5 ([id number])  Complications: No apparent anesthesia complications

## 2016-10-10 NOTE — Interval H&P Note (Signed)
History and Physical Interval Note:  10/10/2016 7:10 AM  Wanda Curry  has presented today for surgery, with the diagnosis of colon cancer  The various methods of treatment have been discussed with the patient and family. After consideration of risks, benefits and other options for treatment, the patient has consented to  Procedure(s): PARTIAL COLECTOMY (N/A) as a surgical intervention .  The patient's history has been reviewed, patient examined, no change in status, stable for surgery.  I have reviewed the patient's chart and labs.  Questions were answered to the patient's satisfaction.     Aviva Signs A

## 2016-10-11 LAB — BASIC METABOLIC PANEL
Anion gap: 7 (ref 5–15)
BUN: 12 mg/dL (ref 6–20)
CO2: 29 mmol/L (ref 22–32)
Calcium: 8.1 mg/dL — ABNORMAL LOW (ref 8.9–10.3)
Chloride: 101 mmol/L (ref 101–111)
Creatinine, Ser: 0.78 mg/dL (ref 0.44–1.00)
GFR calc Af Amer: >60 mL/min (ref >60)
GFR calc non Af Amer: >60 mL/min (ref >60)
Glucose, Bld: 150 mg/dL — ABNORMAL HIGH (ref 65–99)
Potassium: 3.6 mmol/L (ref 3.5–5.1)
Sodium: 137 mmol/L (ref 135–145)

## 2016-10-11 LAB — GLUCOSE, CAPILLARY
Glucose-Capillary: 150 mg/dL — ABNORMAL HIGH (ref 65–99)
Glucose-Capillary: 163 mg/dL — ABNORMAL HIGH (ref 65–99)
Glucose-Capillary: 212 mg/dL — ABNORMAL HIGH (ref 65–99)
Glucose-Capillary: 187 mg/dL — ABNORMAL HIGH (ref 65–99)

## 2016-10-11 LAB — CBC
HCT: 30.1 % — ABNORMAL LOW (ref 36.0–46.0)
Hemoglobin: 9.6 g/dL — ABNORMAL LOW (ref 12.0–15.0)
MCH: 26.7 pg (ref 26.0–34.0)
MCHC: 31.9 g/dL (ref 30.0–36.0)
MCV: 83.6 fL (ref 78.0–100.0)
Platelets: 267 10*3/uL (ref 150–400)
RBC: 3.6 MIL/uL — ABNORMAL LOW (ref 3.87–5.11)
RDW: 17 % — ABNORMAL HIGH (ref 11.5–15.5)
WBC: 9.9 10*3/uL (ref 4.0–10.5)

## 2016-10-11 LAB — MAGNESIUM: Magnesium: 1.4 mg/dL — ABNORMAL LOW (ref 1.7–2.4)

## 2016-10-11 LAB — PHOSPHORUS: Phosphorus: 3.3 mg/dL (ref 2.5–4.6)

## 2016-10-11 MED ORDER — MAGNESIUM SULFATE 2 GM/50ML IV SOLN
2.0000 g | Freq: Once | INTRAVENOUS | Status: AC
  Administered 2016-10-11: 2 g via INTRAVENOUS
  Filled 2016-10-11: qty 50

## 2016-10-11 NOTE — Addendum Note (Signed)
Addendum  created 10/11/16 1025 by Ollen Bowl, CRNA   Sign clinical note

## 2016-10-11 NOTE — Progress Notes (Signed)
1 Day Post-Op  Subjective: Moderate incisional pain, though well controlled. Patient tolerated clear liquid diet well.  Objective: Vital signs in last 24 hours: Temp:  [98 F (36.7 C)-98.2 F (36.8 C)] 98 F (36.7 C) (02/24 0520) Pulse Rate:  [61-108] 108 (02/24 0520) Resp:  [8-23] 18 (02/24 0520) BP: (95-128)/(24-96) 128/43 (02/24 0520) SpO2:  [92 %-100 %] 95 % (02/24 0520)    Intake/Output from previous day: 02/23 0701 - 02/24 0700 In: 2805 [I.V.:2805] Out: 850 [Urine:800; Blood:50] Intake/Output this shift: No intake/output data recorded.  General appearance: alert, cooperative and no distress Resp: clear to auscultation bilaterally Cardio: regular rate and rhythm, S1, S2 normal, no murmur, click, rub or gallop GI: Soft, incisions healing well. Minimal bowel sounds appreciated.  Lab Results:   Recent Labs  10/08/16 1408 10/11/16 0642  WBC 8.2 9.9  HGB 10.5* 9.6*  HCT 32.2* 30.1*  PLT 300 267   BMET  Recent Labs  10/08/16 1408 10/11/16 0642  NA 133* 137  K 3.8 3.6  CL 99* 101  CO2 26 29  GLUCOSE 228* 150*  BUN 24* 12  CREATININE 1.05* 0.78  CALCIUM 9.0 8.1*   PT/INR No results for input(s): LABPROT, INR in the last 72 hours.  Studies/Results: No results found.  Anti-infectives: Anti-infectives    Start     Dose/Rate Route Frequency Ordered Stop   10/10/16 0711  ertapenem [facility]) 1 g in sodium chloride 0.9 % 50 mL IVPB     1 g 100 mL/hr over 30 Minutes Intravenous On call to O.R. 10/10/16 [id number] 10/10/16 0809      Assessment/Plan: s/p Procedure(s): PARTIAL COLECTOMY Impression: Stable on postoperative day 1. Mild hypomagnesemia is present. This will be addressed. We will adjust IV fluids. Will advance to full liquid diet. Foley is out. Patient has already been sitting in chair.  LOS: 1 day    Ayleah Hofmeister A 10/11/2016

## 2016-10-11 NOTE — Anesthesia Postprocedure Evaluation (Signed)
Anesthesia Post Note  Patient: Wanda Curry  Procedure(s) Performed: Procedure(s) (LRB): PARTIAL COLECTOMY (N/A)  Patient location during evaluation: Nursing Unit Anesthesia Type: General Level of consciousness: awake and alert and oriented Pain management: pain level controlled Vital Signs Assessment: post-procedure vital signs reviewed and stable Respiratory status: spontaneous breathing Cardiovascular status: blood pressure returned to baseline Postop Assessment: no signs of nausea or vomiting Anesthetic complications: no     Last Vitals:  Vitals:   10/10/16 2233 10/11/16 0520  BP: (!) 123/96 (!) 128/43  Pulse: 80 (!) 108  Resp: 18 18  Temp: 36.7 C 36.7 C    Last Pain:  Vitals:   10/11/16 0520  TempSrc: Oral  PainSc:                  Tressie Stalker

## 2016-10-12 LAB — GLUCOSE, CAPILLARY
Glucose-Capillary: 183 mg/dL — ABNORMAL HIGH (ref 65–99)
Glucose-Capillary: 252 mg/dL — ABNORMAL HIGH (ref 65–99)
Glucose-Capillary: 135 mg/dL — ABNORMAL HIGH (ref 65–99)
Glucose-Capillary: 234 mg/dL — ABNORMAL HIGH (ref 65–99)

## 2016-10-12 LAB — CBC
HCT: 32.8 % — ABNORMAL LOW (ref 36.0–46.0)
Hemoglobin: 10.6 g/dL — ABNORMAL LOW (ref 12.0–15.0)
MCH: 27.3 pg (ref 26.0–34.0)
MCHC: 32.3 g/dL (ref 30.0–36.0)
MCV: 84.5 fL (ref 78.0–100.0)
Platelets: 317 10*3/uL (ref 150–400)
RBC: 3.88 MIL/uL (ref 3.87–5.11)
RDW: 17.3 % — ABNORMAL HIGH (ref 11.5–15.5)
WBC: 14.2 10*3/uL — ABNORMAL HIGH (ref 4.0–10.5)

## 2016-10-12 LAB — BASIC METABOLIC PANEL
Anion gap: 11 (ref 5–15)
BUN: 9 mg/dL (ref 6–20)
CO2: 28 mmol/L (ref 22–32)
Calcium: 8.6 mg/dL — ABNORMAL LOW (ref 8.9–10.3)
Chloride: 97 mmol/L — ABNORMAL LOW (ref 101–111)
Creatinine, Ser: 0.84 mg/dL (ref 0.44–1.00)
GFR calc Af Amer: >60 mL/min (ref >60)
GFR calc non Af Amer: >60 mL/min (ref >60)
Glucose, Bld: 188 mg/dL — ABNORMAL HIGH (ref 65–99)
Potassium: 3.6 mmol/L (ref 3.5–5.1)
Sodium: 136 mmol/L (ref 135–145)

## 2016-10-12 LAB — PHOSPHORUS: Phosphorus: 2.8 mg/dL (ref 2.5–4.6)

## 2016-10-12 LAB — MAGNESIUM: Magnesium: 1.6 mg/dL — ABNORMAL LOW (ref 1.7–2.4)

## 2016-10-12 MED ORDER — MAGNESIUM SULFATE 2 GM/50ML IV SOLN
2.0000 g | Freq: Once | INTRAVENOUS | Status: AC
  Administered 2016-10-12: 2 g via INTRAVENOUS
  Filled 2016-10-12: qty 50

## 2016-10-12 NOTE — Progress Notes (Signed)
Pt's IV is out.  Dr. Arnoldo Morale informed & is ok not to restart.  PO intake is good w/no n/v.

## 2016-10-12 NOTE — Progress Notes (Signed)
2 Days Post-Op  Subjective: Patient tolerating full liquid diet well. She has no significant abdominal pain. She is ambulating.  Objective: Vital signs in last 24 hours: Temp:  [98.2 F (36.8 C)-98.6 F (37 C)] 98.4 F (36.9 C) (02/25 0645) Pulse Rate:  [90-99] 98 (02/25 0645) Resp:  [18] 18 (02/25 0645) BP: (121-156)/(49-64) 156/64 (02/25 0645) SpO2:  [94 %-98 %] 95 % (02/25 0645)    Intake/Output from previous day: 02/24 0701 - 02/25 0700 In: -  Out: 500 [Urine:500] Intake/Output this shift: No intake/output data recorded.  General appearance: alert, cooperative and no distress Resp: clear to auscultation bilaterally Cardio: regular rate and rhythm, S1, S2 normal, no murmur, click, rub or gallop GI: Soft, incision healing well.  Lab Results:   Recent Labs  10/11/16 0642 10/12/16 0642  WBC 9.9 14.2*  HGB 9.6* 10.6*  HCT 30.1* 32.8*  PLT 267 317   BMET  Recent Labs  10/11/16 0642 10/12/16 0642  NA 137 136  K 3.6 3.6  CL 101 97*  CO2 29 28  GLUCOSE 150* 188*  BUN 12 9  CREATININE 0.78 0.84  CALCIUM 8.1* 8.6*   PT/INR No results for input(s): LABPROT, INR in the last 72 hours.  Studies/Results: No results found.  Anti-infectives: Anti-infectives    Start     Dose/Rate Route Frequency Ordered Stop   10/10/16 0711  ertapenem [facility]) 1 g in sodium chloride 0.9 % 50 mL IVPB     1 g 100 mL/hr over 30 Minutes Intravenous On call to O.R. 10/10/16 [id number] 10/10/16 0809      Assessment/Plan: s/p Procedure(s): PARTIAL COLECTOMY Impression: Doing well. Still with hypomagnesemia. No large bowel movement yet, but tolerating full liquid diet well. Will advance to soft diet. We'll supplement magnesium. Anticipate discharge in next 24-48 hours.  LOS: 2 days    Jomayra Novitsky A 10/12/2016

## 2016-10-13 ENCOUNTER — Encounter (HOSPITAL_COMMUNITY): Payer: Self-pay | Admitting: General Surgery

## 2016-10-13 LAB — BASIC METABOLIC PANEL
Anion gap: 7 (ref 5–15)
BUN: 12 mg/dL (ref 6–20)
CO2: 31 mmol/L (ref 22–32)
Calcium: 8.7 mg/dL — ABNORMAL LOW (ref 8.9–10.3)
Chloride: 98 mmol/L — ABNORMAL LOW (ref 101–111)
Creatinine, Ser: 0.76 mg/dL (ref 0.44–1.00)
GFR calc Af Amer: >60 mL/min (ref >60)
GFR calc non Af Amer: >60 mL/min (ref >60)
Glucose, Bld: 187 mg/dL — ABNORMAL HIGH (ref 65–99)
Potassium: 3.5 mmol/L (ref 3.5–5.1)
Sodium: 136 mmol/L (ref 135–145)

## 2016-10-13 LAB — CBC
HCT: 30.2 % — ABNORMAL LOW (ref 36.0–46.0)
Hemoglobin: 9.9 g/dL — ABNORMAL LOW (ref 12.0–15.0)
MCH: 27.3 pg (ref 26.0–34.0)
MCHC: 32.8 g/dL (ref 30.0–36.0)
MCV: 83.4 fL (ref 78.0–100.0)
Platelets: 267 10*3/uL (ref 150–400)
RBC: 3.62 MIL/uL — ABNORMAL LOW (ref 3.87–5.11)
RDW: 17.3 % — ABNORMAL HIGH (ref 11.5–15.5)
WBC: 13.3 10*3/uL — ABNORMAL HIGH (ref 4.0–10.5)

## 2016-10-13 LAB — MAGNESIUM: Magnesium: 1.5 mg/dL — ABNORMAL LOW (ref 1.7–2.4)

## 2016-10-13 LAB — GLUCOSE, CAPILLARY: Glucose-Capillary: 188 mg/dL — ABNORMAL HIGH (ref 65–99)

## 2016-10-13 MED ORDER — MAGNESIUM OXIDE 400 (241.3 MG) MG PO TABS
400.0000 mg | ORAL_TABLET | Freq: Two times a day (BID) | ORAL | Status: DC
  Administered 2016-10-13: 400 mg via ORAL
  Filled 2016-10-13: qty 1

## 2016-10-13 MED ORDER — MAGNESIUM OXIDE 400 (241.3 MG) MG PO TABS: 400.0000 mg | ORAL_TABLET | Freq: Every day | ORAL | 0 refills | Status: DC

## 2016-10-13 MED ORDER — HYDROCODONE-ACETAMINOPHEN 5-325 MG PO TABS: 1.0000 | ORAL_TABLET | Freq: Four times a day (QID) | ORAL | 0 refills | Status: DC | PRN

## 2016-10-13 NOTE — Discharge Summary (Signed)
Physician Discharge Summary  Patient ID: Wanda Curry MRN: [id number] DOB/AGE: [birth year] 73 y.o.  Admit date: 10/10/2016 Discharge date: 10/13/2016  Admission Diagnoses:Colon carcinoma  Discharge Diagnoses: Same Active Problems:   Colon cancer ([place]) Non-insulin-dependent diabetes mellitus, hypomagnesemia  Discharged Condition: good  Hospital Course: Patient is a 71 year old white female who was found on colonoscopy to have a colon carcinoma at the hepatic flexure. After receiving a preoperative bowel prep, the patient underwent a right hemicolectomy on 10/10/2016. She tolerated the procedure well. Her postoperative course was remarkable only for hypomagnesemia which was addressed. Her diet was advanced without difficulty. Final pathology is still pending. The patient is being discharged home on 10/13/2016 in good and improving condition.  Treatments: surgery: Right hemicolectomy on 10/10/2016  Discharge Exam: Blood pressure 124/78, pulse 98, temperature 99.5 F (37.5 C), temperature source Oral, resp. rate 18, height 5\' 5"  (1.651 m), weight 115.7 kg (255 lb), SpO2 93 %. General appearance: alert, cooperative and no distress Resp: clear to auscultation bilaterally Cardio: regular rate and rhythm, S1, S2 normal, no murmur, click, rub or gallop GI: Soft, incision healing well. Bowel sounds present.  Disposition: 01-Home or Self Care  Discharge Instructions    Diet - low sodium heart healthy    Complete by:  As directed    Increase activity slowly    Complete by:  As directed      Allergies as of 10/13/2016      Reactions   Adhesive [tape]    Adhesive tape and band-aids cause skin irritation   Sulfa Antibiotics Nausea Only, Other (See Comments)   Joint paint   Erythromycin Itching, Rash   burning      Medication List    TAKE these medications   aspirin EC 325 MG tablet Take 325 mg by mouth daily.   atorvastatin 20 MG tablet Commonly known as:  LIPITOR Take 20 mg by  mouth daily.   Calcium + D3 600-200 MG-UNIT Tabs Take 1 tablet by mouth 2 (two) times daily.   carvedilol 6.25 MG tablet Commonly known as:  COREG Take 6.25 mg by mouth 2 (two) times daily with a meal.   [employer] PAIN RELIEF EXTRA STRENGTH 500 MG tablet Generic drug:  acetaminophen Take 500 mg by mouth every 6 (six) hours as needed (for pain.).   folic acid 1 MG tablet Commonly known as:  FOLVITE Take 1 mg by mouth daily.   gabapentin 600 MG tablet Commonly known as:  NEURONTIN Take 600 mg by mouth 3 (three) times daily.   GLUCOSAMINE-CHONDROITIN PO Take 1 tablet by mouth 2 (two) times daily.   hydrochlorothiazide 25 MG tablet Commonly known as:  HYDRODIURIL Take 25 mg by mouth daily.   HYDROcodone-acetaminophen 7.5-325 MG tablet Commonly known as:  NORCO Take 1 tablet by mouth 2 (two) times daily. What changed:  Another medication with the same name was added. Make sure you understand how and when to take each.   HYDROcodone-acetaminophen 5-325 MG tablet Commonly known as:  NORCO Take 1 tablet by mouth every 6 (six) hours as needed for moderate pain. What changed:  You were already taking a medication with the same name, and this prescription was added. Make sure you understand how and when to take each.   LANTUS SOLOSTAR 100 UNIT/ML Solostar Pen Generic drug:  Insulin Glargine Inject 45 Units into the skin daily.   magnesium oxide 400 (241.3 Mg) MG tablet Commonly known as:  MAG-OX Take 1 tablet (400 mg total) by mouth daily.  methocarbamol 500 MG tablet Commonly known as:  ROBAXIN Take 500 mg by mouth 4 (four) times daily.   methotrexate 2.5 MG tablet Commonly known as:  RHEUMATREX Take 20 mg by mouth every Sunday. Caution:Chemotherapy. Protect from light.   NOVOLOG MIX 70/30 FLEXPEN (70-30) 100 UNIT/ML FlexPen Generic drug:  insulin aspart protamine - aspart INJECT 30 UNITS SUBCUTANEOUSLY TWICE DAILY AT BREAKFAST AND DINNER   omeprazole 20 MG  capsule Commonly known as:  PRILOSEC Take 40 mg by mouth daily.   rOPINIRole 1 MG tablet Commonly known as:  REQUIP Take 1 mg by mouth 3 (three) times daily.   SAVELLA 100 MG Tabs tablet Generic drug:  Milnacipran HCl Take 100 mg by mouth 2 (two) times daily.   Vitamin D3 50000 units Caps Take 50,000 Units by mouth every Tuesday.      Follow-up Information    Jamesetta So, MD. Schedule an appointment as soon as possible for a visit on 10/21/2016.   Specialty:  General Surgery Why:  March 6th, Tuesday @ 2 PM  Contact information: [address] [place] [phone]           Signed: Aviva Signs A 10/13/2016, 11:02 AM

## 2016-10-13 NOTE — Discharge Instructions (Signed)
Open Colectomy, Care After °This sheet gives you information about how to care for yourself after your procedure. Your health care provider may also give you more specific instructions. If you have problems or questions, contact your health care provider. °What can I expect after the procedure? °After the procedure, it is common to have: °· Pain in your abdomen, especially along your incision. °· Tiredness. Your energy level will return to normal over the next several weeks. °· Constipation. °· Nausea. °· Difficulty urinating. °Follow these instructions at home: °Activity  °· You may be able to return to most of your normal activities within 1-2 weeks, such as working, walking up stairs, and sexual activity. °· Avoid activities that require a lot of energy for 4-6 weeks after surgery, such as running, climbing, and lifting heavy objects. Ask your health care provider what activities are safe for you. °· Take rest breaks during the day as needed. °· Do not drive for 1-2 weeks or until your health care provider says that it is safe. °· Do not drive or use heavy machinery while taking prescription pain medicines. °· Do not lift anything that is heavier than 10 lb (4.3 kg) until your health care provider says that it is safe. °Incision care  °· Follow instructions from your health care provider about how to take care of your incision. Make sure you: °¨ Wash your hands with soap and water before you change your bandage (dressing). If soap and water are not available, use hand sanitizer. °¨ Change your dressing as told by your health care provider. °¨ Leave stitches (sutures) or staples in place. These skin closures may need to stay in place for 2 weeks or longer. °· Avoid wearing tight clothing around your incision. °· Protect your incision area from the sun. °· Check your incision area every day for signs of infection. Check for: °¨ More redness, swelling, or pain. °¨ More fluid or blood. °¨ Warmth. °¨ Pus or a bad  smell. °General instructions  °· Do not take baths, swim, or use a hot tub until your health care provider approves. Ask your health care provider when you may shower. °· Take over-the-counter and prescription medicines, including stool softeners, only as told by your health care provider. °· Eat a low-fat and low-fiber diet for the first 4 weeks after surgery. °· Keep all follow-up visits as told by your health care provider. This is important. °Contact a health care provider if: °· You have more redness, swelling, or pain around your incision. °· You have more fluid or blood coming from your incision. °· Your incision feels warm to the touch. °· You have pus or a bad smell coming from your incision. °· You have a fever or chills. °· You do not have a bowel movement 2-3 days after surgery. °· You cannot eat or drink for 24 hours or more. °· You have persistent nausea and vomiting. °· You have abdominal pain that gets worse and does not get better with medicine. °Get help right away if: °· You have chest pain. °· You have shortness of breath. °· You have pain or swelling in your legs. °· Your incision breaks open after your sutures or staples have been removed. °· You have bleeding from the rectum. °This information is not intended to replace advice given to you by your health care provider. Make sure you discuss any questions you have with your health care provider. °Document Released: 02/25/2011 Document Revised: 05/05/2016 Document Reviewed: 05/05/2016 °Elsevier   2017 [place].

## 2016-10-13 NOTE — Progress Notes (Signed)
Inpatient Diabetes Program Recommendations  AACE/ADA: New Consensus Statement on Inpatient Glycemic Control (2015)  Target Ranges:  Prepandial:   less than 140 mg/dL      Peak postprandial:   less than 180 mg/dL (1-2 hours)      Critically ill patients:  140 - 180 mg/dL   Lab Results  Component Value Date   GLUCAP 188 (H) 10/13/2016   HGBA1C 8.8 (H) 05/17/2015    Review of Glycemic Control Results for Wanda Curry, Wanda Curry (MRN [id number]) as of 10/13/2016 09:42  Ref. Range 10/12/2016 07:57 10/12/2016 11:22 10/12/2016 16:49 10/12/2016 20:22 10/13/2016 07:37  Glucose-Capillary Latest Ref Range: 65 - 99 mg/dL 183 (H) 252 (H) 135 (H) 234 (H) 188 (H)   Diabetes history: DM2 Outpatient Diabetes medications: Lantus 45 units qd + 70/30 30 units bid Current orders for Inpatient glycemic control: Novolog correction 0-15 units tid  Inpatient Diabetes Program Recommendations:  Please consider: -Novolog 0-5 units hs -A1c to determine prehospital glycemic control  Thank you, Bethena Roys E. Lovey Crupi, RN, MSN, CDE Inpatient Glycemic Control Team Team Pager [phone] (8am-5pm) 10/13/2016 9:49 AM

## 2016-10-13 NOTE — Care Management Note (Signed)
Case Management Note  Patient Details  Name: Wanda Curry MRN: [id number] Date of Birth: [birth year]  Subjective/Objective:                  Pt admitted s/p partial colectomy. Chart reviewed for CM needs. Pt is from home and ind with ADL's. Pt has been ambulating and ind while here in hospital. She has PCP and f/u care, no difficulty with transportation to appointments. She has insurance with drug coverage.   Action/Plan: Pt discharging home today. No CM needs.   Expected Discharge Date:  10/13/16               Expected Discharge Plan:  Home/Self Care  In-House Referral:  NA  Discharge planning Services  CM Consult  Post Acute Care Choice:  NA Choice offered to:  NA  Status of Service:  Completed, signed off  Sherald Barge, RN 10/13/2016, 10:30 AM

## 2016-10-13 NOTE — Care Management Important Message (Signed)
Important Message  Patient Details  Name: Wanda Curry MRN: [id number] Date of Birth: 1945/11/06   Medicare Important Message Given:  Yes    Sherald Barge, RN 10/13/2016, 10:30 AM

## 2016-10-16 ENCOUNTER — Telehealth: Payer: Self-pay | Admitting: General Surgery

## 2016-10-16 ENCOUNTER — Telehealth: Payer: Self-pay | Admitting: Gastroenterology

## 2016-10-16 ENCOUNTER — Other Ambulatory Visit: Payer: Self-pay

## 2016-10-16 DIAGNOSIS — K769 Liver disease, unspecified: Secondary | ICD-10-CM

## 2016-10-16 NOTE — Telephone Encounter (Signed)
Scheduled, see result note.

## 2016-10-16 NOTE — Telephone Encounter (Signed)
SHE HAS AN INDETERMINATE LESION IN HER LIVER. SHE NEEDS AN MRI LIVER W WO CONTRAST. PER SF

## 2016-10-16 NOTE — Progress Notes (Signed)
PT and her daughter, Wanda Curry who is on her list to call, are aware of results and plan. OK to call [name] when you cannot get in touch with pt. Her phone numbers are : [phone] and cell [phone].  OK to schedule the MRI and nic the CT scan in 6 months.

## 2016-10-17 LAB — BPAM RBC
Blood Product Expiration Date: 201803232359
Blood Product Expiration Date: 201803232359
ISSUE DATE / TIME: 201803022001
ISSUE DATE / TIME: 201803022343
Unit Type and Rh: 5100
Unit Type and Rh: 5100

## 2016-10-17 LAB — TYPE AND SCREEN
ABO/RH(D): O POS
Antibody Screen: NEGATIVE
Unit division: 0
Unit division: 0

## 2016-10-18 ENCOUNTER — Telehealth: Payer: Self-pay | Admitting: General Surgery

## 2016-10-19 NOTE — Telephone Encounter (Signed)
Patient having diarrhea. Probiotics and Imodium discussed with patient.

## 2016-10-21 ENCOUNTER — Encounter: Payer: Self-pay | Admitting: General Surgery

## 2016-10-21 ENCOUNTER — Ambulatory Visit (INDEPENDENT_AMBULATORY_CARE_PROVIDER_SITE_OTHER): Payer: Medicare Other | Admitting: General Surgery

## 2016-10-21 VITALS — BP 150/60 | HR 104 | Temp 98.0°F | Resp 18 | Ht 65.0 in | Wt 248.0 lb

## 2016-10-21 DIAGNOSIS — Z09 Encounter for follow-up examination after completed treatment for conditions other than malignant neoplasm: Secondary | ICD-10-CM

## 2016-10-21 NOTE — Progress Notes (Signed)
Subjective:     Wanda Curry  Patient is status post partial colectomy. She did have some issues with diarrhea, but these seem to be resolving. She is taking Imodium to help control the symptoms when the are severe. She has not taken Imodium today. She denies any fever or chills. Her appetite has returned. Objective:    BP (!) 150/60   Pulse (!) 104   Temp 98 F (36.7 C)   Resp 18   Ht 5\' 5"  (1.651 m)   Wt 248 lb (112.5 kg)   BMI 41.27 kg/m   General:  alert, cooperative and no distress  Abdomen is soft, nontender. Incision healing well. One half of staples removed. Active bowel sounds appreciated.     Assessment:    Doing well postoperatively.    Plan:  We'll see patient back in 1 week for follow-up and staple removal. Will refer to oncology for further evaluation treatment of colon cancer.

## 2016-10-27 ENCOUNTER — Other Ambulatory Visit (HOSPITAL_COMMUNITY): Payer: Medicare Other

## 2016-10-28 ENCOUNTER — Ambulatory Visit (INDEPENDENT_AMBULATORY_CARE_PROVIDER_SITE_OTHER): Payer: Medicare Other | Admitting: General Surgery

## 2016-10-28 ENCOUNTER — Ambulatory Visit (HOSPITAL_COMMUNITY)
Admission: RE | Admit: 2016-10-28 | Discharge: 2016-10-28 | Disposition: A | Discharge status: Home / Self Care | Payer: Medicare Other | Source: Ambulatory Visit | Attending: Gastroenterology | Admitting: Gastroenterology

## 2016-10-28 ENCOUNTER — Encounter: Payer: Self-pay | Admitting: General Surgery

## 2016-10-28 VITALS — BP 152/68 | HR 98 | Temp 97.5°F | Resp 20 | Ht 65.0 in | Wt 244.0 lb

## 2016-10-28 DIAGNOSIS — Z9049 Acquired absence of other specified parts of digestive tract: Secondary | ICD-10-CM | POA: Diagnosis not present

## 2016-10-28 DIAGNOSIS — K769 Liver disease, unspecified: Secondary | ICD-10-CM | POA: Insufficient documentation

## 2016-10-28 DIAGNOSIS — Z09 Encounter for follow-up examination after completed treatment for conditions other than malignant neoplasm: Secondary | ICD-10-CM

## 2016-10-28 MED ORDER — GADOBENATE DIMEGLUMINE 529 MG/ML IV SOLN
20.0000 mL | Freq: Once | INTRAVENOUS | Status: AC | PRN
  Administered 2016-10-28: 20 mL via INTRAVENOUS

## 2016-10-28 NOTE — Progress Notes (Signed)
Subjective:     Wanda Curry  Here for follow-up wound check. Her diarrhea has been resolving. Her bowel movements are becoming more well formed. Objective:    BP (!) 152/68   Pulse 98   Temp 97.5 F (36.4 C)   Resp 20   Ht 5\' 5"  (1.651 m)   Wt 244 lb (110.7 kg)   BMI 40.60 kg/m   General:  alert, cooperative and no distress  Abdomen is soft. Remaining staples removed. Incision healing well.     Assessment:    Doing well postoperatively.    Plan:  Patient to follow-up with oncology, already scheduled.

## 2016-11-04 NOTE — Telephone Encounter (Signed)
Path report given

## 2016-11-07 ENCOUNTER — Encounter (HOSPITAL_COMMUNITY): Payer: Medicare Other | Attending: Hematology | Admitting: Hematology

## 2016-11-07 ENCOUNTER — Other Ambulatory Visit (HOSPITAL_COMMUNITY): Payer: Self-pay | Admitting: Hematology

## 2016-11-07 ENCOUNTER — Encounter (HOSPITAL_COMMUNITY): Payer: Self-pay | Admitting: Hematology

## 2016-11-07 ENCOUNTER — Encounter (HOSPITAL_COMMUNITY): Payer: Medicare Other

## 2016-11-07 VITALS — BP 127/57 | HR 95 | Temp 98.2°F | Resp 18 | Ht 65.5 in | Wt 242.2 lb

## 2016-11-07 DIAGNOSIS — E785 Hyperlipidemia, unspecified: Secondary | ICD-10-CM | POA: Insufficient documentation

## 2016-11-07 DIAGNOSIS — E1140 Type 2 diabetes mellitus with diabetic neuropathy, unspecified: Secondary | ICD-10-CM | POA: Diagnosis not present

## 2016-11-07 DIAGNOSIS — C183 Malignant neoplasm of hepatic flexure: Secondary | ICD-10-CM | POA: Diagnosis not present

## 2016-11-07 DIAGNOSIS — K219 Gastro-esophageal reflux disease without esophagitis: Secondary | ICD-10-CM | POA: Diagnosis not present

## 2016-11-07 DIAGNOSIS — R160 Hepatomegaly, not elsewhere classified: Secondary | ICD-10-CM

## 2016-11-07 DIAGNOSIS — Z87891 Personal history of nicotine dependence: Secondary | ICD-10-CM | POA: Diagnosis not present

## 2016-11-07 DIAGNOSIS — Z7982 Long term (current) use of aspirin: Secondary | ICD-10-CM | POA: Diagnosis not present

## 2016-11-07 DIAGNOSIS — K5730 Diverticulosis of large intestine without perforation or abscess without bleeding: Secondary | ICD-10-CM | POA: Diagnosis not present

## 2016-11-07 DIAGNOSIS — Z794 Long term (current) use of insulin: Secondary | ICD-10-CM | POA: Insufficient documentation

## 2016-11-07 DIAGNOSIS — D649 Anemia, unspecified: Secondary | ICD-10-CM | POA: Diagnosis not present

## 2016-11-07 DIAGNOSIS — Z9889 Other specified postprocedural states: Secondary | ICD-10-CM | POA: Insufficient documentation

## 2016-11-07 DIAGNOSIS — F329 Major depressive disorder, single episode, unspecified: Secondary | ICD-10-CM | POA: Diagnosis not present

## 2016-11-07 DIAGNOSIS — Z9189 Other specified personal risk factors, not elsewhere classified: Secondary | ICD-10-CM

## 2016-11-07 DIAGNOSIS — M069 Rheumatoid arthritis, unspecified: Secondary | ICD-10-CM | POA: Diagnosis not present

## 2016-11-07 DIAGNOSIS — C182 Malignant neoplasm of ascending colon: Secondary | ICD-10-CM | POA: Insufficient documentation

## 2016-11-07 DIAGNOSIS — Z9049 Acquired absence of other specified parts of digestive tract: Secondary | ICD-10-CM | POA: Diagnosis not present

## 2016-11-07 DIAGNOSIS — K769 Liver disease, unspecified: Secondary | ICD-10-CM | POA: Insufficient documentation

## 2016-11-07 DIAGNOSIS — Z85828 Personal history of other malignant neoplasm of skin: Secondary | ICD-10-CM | POA: Diagnosis not present

## 2016-11-07 DIAGNOSIS — M797 Fibromyalgia: Secondary | ICD-10-CM | POA: Insufficient documentation

## 2016-11-07 LAB — CBC WITH DIFFERENTIAL/PLATELET
Basophils Absolute: 0 10*3/uL (ref 0.0–0.1)
Basophils Relative: 0 %
Eosinophils Absolute: 0.6 10*3/uL (ref 0.0–0.7)
Eosinophils Relative: 5 %
HCT: 35.2 % — ABNORMAL LOW (ref 36.0–46.0)
Hemoglobin: 11.3 g/dL — ABNORMAL LOW (ref 12.0–15.0)
Lymphocytes Relative: 28 %
Lymphs Abs: 3.1 10*3/uL (ref 0.7–4.0)
MCH: 26.6 pg (ref 26.0–34.0)
MCHC: 32.1 g/dL (ref 30.0–36.0)
MCV: 82.8 fL (ref 78.0–100.0)
Monocytes Absolute: 0.8 10*3/uL (ref 0.1–1.0)
Monocytes Relative: 7 %
Neutro Abs: 6.7 10*3/uL (ref 1.7–7.7)
Neutrophils Relative %: 60 %
Platelets: 268 10*3/uL (ref 150–400)
RBC: 4.25 MIL/uL (ref 3.87–5.11)
RDW: 17.4 % — ABNORMAL HIGH (ref 11.5–15.5)
WBC: 11.2 10*3/uL — ABNORMAL HIGH (ref 4.0–10.5)

## 2016-11-07 LAB — COMPREHENSIVE METABOLIC PANEL
ALT: 29 U/L (ref 14–54)
AST: 28 U/L (ref 15–41)
Albumin: 3.4 g/dL — ABNORMAL LOW (ref 3.5–5.0)
Alkaline Phosphatase: 50 U/L (ref 38–126)
Anion gap: 8 (ref 5–15)
BUN: 18 mg/dL (ref 6–20)
CO2: 31 mmol/L (ref 22–32)
Calcium: 9.5 mg/dL (ref 8.9–10.3)
Chloride: 98 mmol/L — ABNORMAL LOW (ref 101–111)
Creatinine, Ser: 0.92 mg/dL (ref 0.44–1.00)
GFR calc Af Amer: >60 mL/min (ref >60)
GFR calc non Af Amer: >60 mL/min (ref >60)
Glucose, Bld: 160 mg/dL — ABNORMAL HIGH (ref 65–99)
Potassium: 3.8 mmol/L (ref 3.5–5.1)
Sodium: 137 mmol/L (ref 135–145)
Total Bilirubin: 0.6 mg/dL (ref 0.3–1.2)
Total Protein: 7.1 g/dL (ref 6.5–8.1)

## 2016-11-07 NOTE — Progress Notes (Signed)
Marland Kitchen    HEMATOLOGY/ONCOLOGY CONSULTATION NOTE  Date of Service: 11/07/2016  Patient Care Team: Earney Mallet, MD as PCP - General (Family Medicine) Danie Binder, MD as Consulting Physician (Gastroenterology) Dr Aviva Signs MD (general surgery)   CHIEF COMPLAINTS/PURPOSE OF CONSULTATION:  Newly diagnosed colon cancer   HISTORY OF PRESENTING ILLNESS:   Wanda Curry is a wonderful 71 y.o. female who has been referred to [place] by Dr .Earney Mallet, MD/Dr Benay Pillow MD for evaluation and management of newly diagnosed colon cancer.  She has a history of rheumatoid arthritis, diabetes, fibromyalgia, dyslipidemia, mild diabetic neuropathy who was noted to have new anemia and was subsequently noted to be fecal occult blood positive. Patient was referred for colonoscopy that was done by Dr. Oneida Alar on 09/23/2016 and showed - One 25 mm, recently bleeding polyp in the proximal ascending colon, NOT REMOVED. Heme positive stools/hematichezia due to malignant tumor at the hepatic flexure AND COLON POLYPS. Biopsied. - Diverticulosis in the sigmoid colon and in the descending colon.  Pathology results of the colon flexure mass was consistent with adenocarcinoma. Other polyps in the rectum descending and transverse colon were noted to be tubular adenomas without high-grade dysplasia. Patient's CEA level was within normal limits at 3.4.  Patient had a CT of the abdomen and pelvis on 10/08/2016 that showed a 3.2 cm indeterminate low attenuation mass in the central liver. No other sites of metastatic disease were identified within the abdomen or pelvis. 6 mm indeterminate pulmonary nodule was seen in the right middle lobe.  Patient had a chest x-ray on 10/08/2016 that showed no acute cardiopulmonary disease.  Patient was seen by Dr. Arnoldo Morale and had a partial colectomy on 10/10/2016. 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 0/19 lymph nodes involved. Lymphovascular invasion was identified. All margins of resection were negative for tumor. pT3pN0 with LVI.  She had some diarrhea for a few days after surgery but this is now resolving and well controlled with Imodium as needed.  Patient is here for follow-up to determine additional treatments. She had an MRI of the liver with and without contrast to further evaluate her indeterminate liver lesion. MRI was done on 10/28/2016 and still suggests an indeterminate lesion within the central right hepatic lobe which demonstrates early enhancement and is therefore concerning. Radiographic differential diagnosis does not exclude colorectal carcinoma metastases. Other primary hepatobiliary tumor possible, focal fatty infiltration or atypical focal nodular hyperplasia of the liver also in the differential diagnosis.  I spent a significant period of time discussing all of the lab results, imaging studies diagnosis and charting out a workup and possible treatment plan with the patient and her accompanying family. They had several questions which were answered in great details to their apparent satisfaction.  Patient notes no overt GI bleeding at this time.   MEDICAL HISTORY:  Past Medical History:  Diagnosis Date  . Anemia   . Arthritis    RA  . Cancer ([place])    skin cancer  . Depression   . Diabetes mellitus without complication ([place])   . Fibromyalgia   . GERD (gastroesophageal reflux disease)   . Heart murmur    ECHO scheduled 05-25-2015  . Hyperlipidemia   . Neuropathy ([place])   . PONV (postoperative nausea and vomiting)     SURGICAL HISTORY: Past Surgical History:  Procedure Laterality Date  . ABDOMINAL HYSTERECTOMY    . BIOPSY  09/23/2016   Procedure: BIOPSY;  Surgeon: Danie Binder, MD;  [facility];  Service: Endoscopy;;  hepatic flexure mass  . CHOLECYSTECTOMY    . COLONOSCOPY WITH PROPOFOL N/A 09/23/2016   Procedure:  COLONOSCOPY WITH PROPOFOL;  Surgeon: Danie Binder, MD;  [facility];  Service: Endoscopy;  Laterality: N/A;  7:30 am  . HARDWARE REMOVAL Right 05/30/2015   Procedure: HARDWARE REMOVAL;  Surgeon: Ninetta Lights, MD;  Location: [place];  Service: Orthopedics;  Laterality: Right;  . PARTIAL COLECTOMY N/A 10/10/2016   Procedure: PARTIAL COLECTOMY;  Surgeon: Aviva Signs, MD;  Location: [facility];  Service: General;  Laterality: N/A;  . planter warts Bilateral    both feet  . POLYPECTOMY  09/23/2016   Procedure: POLYPECTOMY;  Surgeon: Danie Binder, MD;  [facility];  Service: Endoscopy;;  transverse colon polyps times 2, rectal polyp  . skin grafts     due to planter warts  . TOTAL KNEE ARTHROPLASTY Right 05/30/2015   Procedure: RIGHT TOTAL KNEE ARTHROPLASTY;  Surgeon: Ninetta Lights, MD;  Location: [place];  Service: Orthopedics;  Laterality: Right;    SOCIAL HISTORY: Social History   Social History  . Marital status: Widowed    Spouse name: N/A  . Number of children: N/A  . Years of education: N/A   Occupational History  . Not on file.   Social History Main Topics  . Smoking status: Former Smoker    Packs/day: 1.00    Years: 40.00    Types: Cigarettes    Quit date: 08/18/2004  . Smokeless tobacco: Never Used  . Alcohol use No  . Drug use: No  . Sexual activity: Not Currently    Birth control/ protection: Surgical   Other Topics Concern  . Not on file   Social History Narrative  . No narrative on file    FAMILY HISTORY: Family History  Problem Relation Age of Onset  . Colon cancer Neg Hx     ALLERGIES:  is allergic to adhesive [tape]; sulfa antibiotics; and erythromycin.  MEDICATIONS:  Current Outpatient Prescriptions  Medication Sig Dispense Refill  . acetaminophen ([employer] PAIN RELIEF EXTRA STRENGTH) 500 MG tablet Take 500 mg by mouth every 6 (six) hours as needed (for pain.).    Marland Kitchen aspirin EC 325 MG tablet Take 325 mg by mouth daily.    Marland Kitchen  atorvastatin (LIPITOR) 20 MG tablet Take 20 mg by mouth daily.    . Calcium Carb-Cholecalciferol (CALCIUM + D3) 600-200 MG-UNIT TABS Take 1 tablet by mouth 2 (two) times daily.  0  . carvedilol (COREG) 6.25 MG tablet Take 6.25 mg by mouth 2 (two) times daily with a meal.    . Cholecalciferol (VITAMIN D3) 50000 units CAPS Take 50,000 Units by mouth every Tuesday.  1  . folic acid (FOLVITE) 1 MG tablet Take 1 mg by mouth daily.    Marland Kitchen gabapentin (NEURONTIN) 600 MG tablet Take 600 mg by mouth 3 (three) times daily.    Marland Kitchen GLUCOSAMINE-CHONDROITIN PO Take 1 tablet by mouth 2 (two) times daily.    . hydrochlorothiazide (HYDRODIURIL) 25 MG tablet Take 25 mg by mouth daily.    Marland Kitchen HYDROcodone-acetaminophen (NORCO) 5-325 MG tablet Take 1 tablet by mouth every 6 (six) hours as needed for moderate pain. 40 tablet 0  . HYDROcodone-acetaminophen (NORCO) 7.5-325 MG tablet Take 1 tablet by mouth 2 (two) times daily.  0  . LANTUS SOLOSTAR 100 UNIT/ML Solostar Pen Inject  45 Units into the skin daily.   3  . magnesium oxide (MAG-OX) 400 (241.3 Mg) MG tablet Take 1 tablet (400 mg total) by mouth daily. 60 tablet 0  . methocarbamol (ROBAXIN) 500 MG tablet Take 500 mg by mouth 4 (four) times daily.    . methotrexate (RHEUMATREX) 2.5 MG tablet Take 20 mg by mouth every Sunday. Caution:Chemotherapy. Protect from light.     . Milnacipran HCl (SAVELLA) 100 MG TABS tablet Take 100 mg by mouth 2 (two) times daily.    Marland Kitchen NOVOLOG MIX 70/30 FLEXPEN (70-30) 100 UNIT/ML FlexPen INJECT 30 UNITS SUBCUTANEOUSLY TWICE DAILY AT BREAKFAST AND DINNER  3  . omeprazole (PRILOSEC) 20 MG capsule Take 40 mg by mouth daily.   3  . rOPINIRole (REQUIP) 1 MG tablet Take 1 mg by mouth 3 (three) times daily.     No current facility-administered medications for this visit.     REVIEW OF SYSTEMS:    10 Point review of Systems was done is negative except as noted above.  PHYSICAL EXAMINATION: ECOG PERFORMANCE STATUS: 2 - Symptomatic, <50% confined  to bed  . Vitals:   11/07/16 1318  BP: (!) 127/57  Pulse: 95  Resp: 18  Temp: 98.2 F (36.8 C)   Filed Weights   11/07/16 1318  Weight: 242 lb 3.2 oz (109.9 kg)   .Body mass index is 39.69 kg/m.  GENERAL:alert, in no acute distress and comfortable SKIN: no acute rashes, no significant lesions EYES: conjunctiva are pink and non-injected, sclera anicteric OROPHARYNX: MMM, no exudates, no oropharyngeal erythema or ulceration NECK: supple, no JVD LYMPH:  no palpable lymphadenopathy in the cervical, axillary or inguinal regions LUNGS: clear to auscultation b/l with normal respiratory effort HEART: regular rate & rhythm ABDOMEN:  normoactive bowel sounds , non tender, not distended. Well-healed surgical incisions. No significant tenderness to palpation no guarding rigidity or rebound. Extremity: no pedal edema PSYCH: alert & oriented x 3 with fluent speech NEURO: no focal motor/sensory deficits  LABORATORY DATA:  I have reviewed the data as listed  . CBC Latest Ref Rng & Units 11/07/2016 10/13/2016 10/12/2016  WBC 4.0 - 10.5 K/uL 11.2(H) 13.3(H) 14.2(H)  Hemoglobin 12.0 - 15.0 g/dL 11.3(L) 9.9(L) 10.6(L)  Hematocrit 36.0 - 46.0 % 35.2(L) 30.2(L) 32.8(L)  Platelets 150 - 400 K/uL 268 267 317    . CMP Latest Ref Rng & Units 11/07/2016 10/13/2016 10/12/2016  Glucose 65 - 99 mg/dL 160(H) 187(H) 188(H)  BUN 6 - 20 mg/dL '18 12 9  '$ Creatinine 0.44 - 1.00 mg/dL 0.92 0.76 0.84  Sodium 135 - 145 mmol/L 137 136 136  Potassium 3.5 - 5.1 mmol/L 3.8 3.5 3.6  Chloride 101 - 111 mmol/L 98(L) 98(L) 97(L)  CO2 22 - 32 mmol/L '31 31 28  '$ Calcium 8.9 - 10.3 mg/dL 9.5 8.7(L) 8.6(L)  Total Protein 6.5 - 8.1 g/dL 7.1 - -  Total Bilirubin 0.3 - 1.2 mg/dL 0.6 - -  Alkaline Phos 38 - 126 U/L 50 - -  AST 15 - 41 U/L 28 - -  ALT 14 - 54 U/L 29 - -        COLON AND RECTUM (INCLUDING TRANS-ANAL RESECTION): Specimen: Right colon, small bowel and appendix Procedure: Hemicolectomy Tumor site:  distal ascending colon Specimen integrity: Intack Macroscopic intactness of mesorectum: Not applicable: x Complete: NA Near complete: NA Incomplete: NA Cannot be determined (specify): NA Macroscopic tumor perforation: the tumor invade through the muscularis propria into peri colonic soft tissue Invasive tumor:  Maximum size: 2.1 cm Histologic type(s): Adenocarcinoma Histologic grade and differentiation: G2 G1: well differentiated/low grade G2: moderately differentiated/low grade G3: poorly differentiated/high grade 1 of 5 Supplemental copy SUPPLEMENTAL for [place], [place] [phone]) Microscopic Comment(continued) G4: undifferentiated/high grade Type of polyp in which invasive carcinoma arose: Tubular adenoma Microscopic extension of invasive tumor: The tumor invades through the muscularis propria into peri colonic soft tissue Lymph-Vascular invasion: Identified Peri-neural invasion: Negative Tumor deposit(s) (discontinuous extramural extension): Negative Resection margins: Proximal margin: Negative Distal margin: Negative Circumferential (radial) (posterior ascending, posterior descending; lateral and posterior mid-rectum; and entire lower 1/3 rectum):Negative Mesenteric margin (sigmoid and transverse): NA Distance closest margin (if all above margins negative): 4 cm to distal margin Trans-anal resection margins only: Deep margin: NA Mucosal Margin: NA Distance closest mucosal margin (if negative): NA Treatment effect (neo-adjuvant therapy): Negative Additional polyp(s): Cecum tubular adenoma Non-neoplastic findings: unremarkable Lymph nodes: number examined 19; number positive: 0 Pathologic Staging: pT3, N0, Mx Ancillary studies: MSI ordered Casimer Lanius MD Pathologist, Electronic Signature       Colonoscopy 09/23/2016 - One 25 mm, recently bleeding polyp in the proximal ascending colon, NOT REMOVED. - Heme positive stools/hematichezia due to malignant tumor at the  hepatic flexure AND COLON POLYPS. Biopsied. - Diverticulosis in the sigmoid colon and in the descending colon.   RADIOGRAPHIC STUDIES: I have personally reviewed the radiological images as listed and agreed with the findings in the report. Mr Liver W Wo Contrast  Result Date: 10/28/2016 CLINICAL DATA:  Patient status post RIGHT hemicolectomy for ascending colon carcinoma. Grade 2 carcinoma with pericolonic soft tissue invasion. Negative lymph nodes (19 stable). Indeterminate hepatic lesion on CT exam EXAM: MRI ABDOMEN WITHOUT AND WITH CONTRAST TECHNIQUE: Multiplanar multisequence MR imaging of the abdomen was performed both before and after the administration of intravenous contrast. CONTRAST:  83m MULTIHANCE GADOBENATE DIMEGLUMINE 529 MG/ML IV SOLN COMPARISON:  CT 10/08/2016 FINDINGS: Lower chest:  Lung bases are clear. Hepatobiliary: Within the central RIGHT hepatic lobe the lesion described on comparison CT is subtly evident. Lesion is mildly hyperintense on T2 weighted imaging measuring 2.4 cm x 2.1 cm (image 15, series 5 and image 22, series 3). Lesion is mildly hypointense on T1 weighted imaging (series 12). Lesion does not demonstrate clear loss of signal intensity on the opposed phase imaging. On post-contrast series lesion demonstrates early mild enhancement (image 29, series 14, series 5005) and has rapid washout and is essentially isointense on the remaining sequences. No additional hepatic lesions. No biliary duct dilatation. Postcholecystectomy. Pancreas: Normal pancreatic parenchymal intensity. No ductal dilatation or inflammation. Spleen: Normal spleen. Adrenals/urinary tract: Adrenal glands and kidneys are normal. Stomach/Bowel: Stomach and limited of the small bowel is unremarkable Vascular/Lymphatic: All Musculoskeletal: No aggressive osseous lesion IMPRESSION: 1. Indeterminate lesion within the central RIGHT hepatic lobe demonstrates early enhancement and is therefore concerning.  Differential would include atypical focal fatty infiltration, atypical focal nodular hyperplasia, and colorectal carcinoma metastasis. Low CEA and negative lymph nodes would weight against colorectal carcinoma metastasis. Pericolonic fat invasion is an aggressive feature. Lesion warrants further evaluation with FDG PET scan or follow-up MRI or CT in 1 to 3 months. 2. No additional evidence of metastatic disease. These results will be called to the ordering clinician or representative by the Radiologist Assistant, and communication documented in the PACS or zVision Dashboard. Electronically Signed   By: SSuzy BouchardM.D.   On: 10/28/2016 17:19    ASSESSMENT & PLAN:   71year old very pleasant lady with  1) Newly diagnosed  colon adenocarcinoma. pT3 pN0.  Metastasis status unclear since the patient has an isolated indeterminate liver lesion measuring 3.2 cm. MR liver could not rule out the possibility of metastatic colorectal cancer. Given her pre-surgery CEA level was within normal limits and her nodal status is negative unclear if this truly represents a liver metastasis. Knowing this is of utmost importance to planning this patient's treatment. If liver metastases is ruled out this represents stage II disease with high-risk features. Alternatively it would be an oligo metastatic stage IV disease. Plan -The diagnosis, challenge with staging, evaluation plan and potential treatment options were discussed in great detail with the patient and her accompanying family and all of their questions were answered in details. -Would need to get a PET CT scan defined if the liver lesion is FDG avid, if there are other non-diagnosed FDG avid lesions, to study the pulmonary nodule and to further evaluate other metastatic disease since this would have a significant bearing on her staging and treatment options. -IR consultation for CT-guided biopsy of the liver lesion if possible. -If liver lesion is noted to be  not related to the colorectal cancer and the PET/CT is negative for other metastatic disease this would suggest stage II high risk disease  In the presence of lymphovascular invasion. In this setting our recommendations would be a choice between observation versus Xeloda vs 5FU/LV. This setting the patient suggests that she would likely prefer to go with adjuvant Capecitabine. Patient above 70's  do not have a clear benefit of adding Oxaloplatin in the adjuvant setting for stage II disease. -If this represents oligo metastatic disease to liver would consider FOLFOX chemotherapy and CT-guided ablation of the liver nodule if technically feasible. Patient suggests in this setting she would not be particularly keen for liver surgery. -Consider getting CA-19-9 level on follow-up since intrahepatic cholangiolar carcinoma might also be in the differential though statistically the presence of 2 different tumors would be less likely. -The fact that she is on methotrexate for rheumatoid arthritis would be an important consideration while considering chemotherapy. Would likely need to hold methotrexate while the patient is on chemotherapy.  #2 Anemia This appears to be a combination of iron deficiency from GI blood loss. Anemia could also partly be weak from her methotrexate use for rheumatoid arthritis. Patient was transfused on 2/27 with improvement in her hemoglobin. Plan - given her bowel issues we'll hold off on oral iron at this time - patient encouraged to increase intake of iron rich foods . -Will check a ferritin and iron profile on clinic follow-up in 2-3 weeks and consider oral iron replacement as needed . -Continue folic acid 1-2 mg daily while on methotrexate .  Return to clinic in 2-3 weeks with M.D. with labs after PET CT scan and CT-guided liver biopsy to make final treatment decisions   All of the patients questions were answered with apparent satisfaction. The patient knows to call the clinic  with any problems, questions or concerns.  I spent 72 minutes counseling the patient face to face. The total time spent in the appointment was 80 minutes and more than 50% was on counseling and direct patient cares.    Sullivan Lone MD [place] AAHIVMS [facility] Hematology/Oncology Physician [facility]  (Office):       [phone] (Work cell):  [phone] (Fax):           [phone]  11/07/2016 1:16 PM

## 2016-11-07 NOTE — Patient Instructions (Signed)
[facility] at [facility] Discharge Instructions  RECOMMENDATIONS MADE BY THE CONSULTANT AND ANY TEST RESULTS WILL BE SENT TO YOUR REFERRING PHYSICIAN.  You were seen today by Dr. Irene Limbo Lab work today and we will call you with the results PET scan will be scheduled as well as a liver biopsy Follow up in 2-3 weeks after liver biopsy See Amy up front for appointments   Thank you for choosing [place] at [facility] to provide your oncology and hematology care.  To afford each patient quality time with our provider, please arrive at least 15 minutes before your scheduled appointment time.    If you have a lab appointment with the [facility] please come in thru the  Main Entrance and check in at the main information desk  You need to re-schedule your appointment should you arrive 10 or more minutes late.  We strive to give you quality time with our providers, and arriving late affects you and other patients whose appointments are after yours.  Also, if you no show three or more times for appointments you may be dismissed from the clinic at the providers discretion.     Again, thank you for choosing [facility].  Our hope is that these requests will decrease the amount of time that you wait before being seen by our physicians.       _____________________________________________________________  Should you have questions after your visit to [facility], please contact our office at [phone] between the hours of 8:30 a.m. and 4:30 p.m.  Voicemails left after 4:30 p.m. will not be returned until the following business day.  For prescription refill requests, have your pharmacy contact our office.       Resources For Cancer Patients and their Caregivers ? American Cancer Society: Can assist with transportation, wigs, general needs, runs Look Good Feel Better.        463-448-9170 ? Cancer Care: Provides financial  assistance, online support groups, medication/co-pay assistance.  1-800-813-HOPE 6142280429) ? [place] Assists [place] cancer patients and their families through emotional , educational and financial support.  289-332-1045 ? [place] DSS Where to apply for food stamps, Medicaid and utility assistance. 787-839-3257 ? [facility]: Transportation to medical appointments. 604-637-5493 ? Social Security Administration: May apply for disability if have a Stage IV cancer. 343-114-2868 220-332-9885 ? [employer], Disability and Transit Services: Assists with nutrition, care and transit needs. [place] Support Programs: @10RELATIVEDAYS @ > Cancer Support Group  2nd Tuesday of the month 1pm-2pm, Journey Room  > Creative Journey  3rd Tuesday of the month 1130am-1pm, Journey Room  > Look Good Feel Better  1st Wednesday of the month 10am-12 noon, Journey Room (Call [place] to register [phone])

## 2016-11-08 LAB — AFP TUMOR MARKER: AFP-Tumor Marker: 2.7 ng/mL (ref 0.0–8.3)

## 2016-11-08 LAB — CEA: CEA: 4.1 ng/mL (ref 0.0–4.7)

## 2016-11-11 ENCOUNTER — Encounter: Payer: Self-pay | Admitting: *Deleted

## 2016-11-11 NOTE — Progress Notes (Signed)
[place] Psychosocial Distress Screening Clinical Social Work  Clinical Social Work was referred by distress screening protocol.  The patient scored a 5 on the Psychosocial Distress Thermometer which indicates moderate distress. Clinical Social Worker reviewed chart and phoned pt to assess for distress and other psychosocial needs. CSW introduced self, explained role of CSW/Pt and Family Support Team, support groups and other resources to assist. Pt shared she was concerned about liver lesion. She reports her "colon was killed by surgery". She reports she has strong family support to assist her and help with transportation as well. Pt reports to have possible financial concerns due to medical bills. CSW reviewed options for possible assistance and how to access. CSW to follow and assist as needed. Pt aware of how to contact CSW as well.    ONCBCN DISTRESS SCREENING 11/07/2016  Screening Type Initial Screening  Distress experienced in past week (1-10) 5  Emotional problem type Nervousness/Anxiety;Adjusting to illness  Information Concerns Type Lack of info about diagnosis;Lack of info about treatment  Physician notified of physical symptoms Yes  Referral to clinical psychology No  Referral to clinical social work No  Referral to dietition No  Referral to financial advocate No  Referral to support programs No  Referral to palliative care No    Clinical Social Worker follow up needed: Yes.    If yes, follow up plan: See above  Loren Racer, LCSW, OSW-C [place] Tuesdays   [phone]

## 2016-11-17 ENCOUNTER — Ambulatory Visit (HOSPITAL_COMMUNITY)
Admission: RE | Admit: 2016-11-17 | Discharge: 2016-11-17 | Disposition: A | Discharge status: Home / Self Care | Payer: Medicare Other | Source: Ambulatory Visit | Attending: Hematology | Admitting: Hematology

## 2016-11-17 DIAGNOSIS — J320 Chronic maxillary sinusitis: Secondary | ICD-10-CM | POA: Insufficient documentation

## 2016-11-17 DIAGNOSIS — C189 Malignant neoplasm of colon, unspecified: Secondary | ICD-10-CM | POA: Insufficient documentation

## 2016-11-17 DIAGNOSIS — Z9189 Other specified personal risk factors, not elsewhere classified: Secondary | ICD-10-CM

## 2016-11-17 DIAGNOSIS — R160 Hepatomegaly, not elsewhere classified: Secondary | ICD-10-CM

## 2016-11-17 DIAGNOSIS — I700 Atherosclerosis of aorta: Secondary | ICD-10-CM | POA: Diagnosis not present

## 2016-11-17 DIAGNOSIS — R918 Other nonspecific abnormal finding of lung field: Secondary | ICD-10-CM | POA: Diagnosis not present

## 2016-11-17 LAB — GLUCOSE, CAPILLARY: Glucose-Capillary: 130 mg/dL — ABNORMAL HIGH (ref 65–99)

## 2016-11-17 MED ORDER — FLUDEOXYGLUCOSE F - 18 (FDG) INJECTION
12.0600 | Freq: Once | INTRAVENOUS | Status: AC | PRN
  Administered 2016-11-17: 12.06 via INTRAVENOUS

## 2016-11-20 ENCOUNTER — Other Ambulatory Visit: Payer: Self-pay | Admitting: General Surgery

## 2016-11-21 ENCOUNTER — Ambulatory Visit (HOSPITAL_COMMUNITY): Admission: RE | Admit: 2016-11-21 | Payer: Medicare Other | Source: Ambulatory Visit

## 2016-11-21 ENCOUNTER — Telehealth: Payer: Self-pay | Admitting: General Surgery

## 2016-11-21 ENCOUNTER — Ambulatory Visit (HOSPITAL_COMMUNITY): Payer: Medicare Other

## 2016-11-21 NOTE — Progress Notes (Signed)
In reviewing the patient's information prior to her arrival, I noted that her PET scan done earlier this week on 11-17-16 reveals that her liver lesion that was previously approved for biopsy, revealed no hypermetabolic uptake, indicating this to be a benign lesion.  I have contacted the cancer center and Dr. Irene Limbo is off this week.  I spoke to Dr. Alvy Bimler who is on call and she reviewed the case.  After evaluation, it was determined the patient did not need this biopsy as it appeared the PET scan revealed this to be a benign lesion.  I have contacted the patient's daughter, which is the contact number in the chart.  She is with the patient.  They have been informed that her procedure is cancelled today and why.  She is very happy to hear this news. Dr. Alvy Bimler will let Dr. Irene Limbo know this situation as well.  Further care and treatment per him when he returns.  Rowan Pollman E 10:28 AM 11/21/2016

## 2016-11-26 ENCOUNTER — Encounter (HOSPITAL_COMMUNITY): Payer: Self-pay

## 2016-11-26 ENCOUNTER — Telehealth (HOSPITAL_COMMUNITY): Payer: Self-pay | Admitting: Emergency Medicine

## 2016-11-26 ENCOUNTER — Encounter (HOSPITAL_COMMUNITY): Payer: Medicare Other | Attending: Hematology | Admitting: Oncology

## 2016-11-26 VITALS — BP 117/53 | HR 90 | Temp 98.7°F | Resp 18 | Wt 245.8 lb

## 2016-11-26 DIAGNOSIS — Z9049 Acquired absence of other specified parts of digestive tract: Secondary | ICD-10-CM | POA: Insufficient documentation

## 2016-11-26 DIAGNOSIS — K219 Gastro-esophageal reflux disease without esophagitis: Secondary | ICD-10-CM | POA: Insufficient documentation

## 2016-11-26 DIAGNOSIS — D649 Anemia, unspecified: Secondary | ICD-10-CM | POA: Diagnosis not present

## 2016-11-26 DIAGNOSIS — Z7982 Long term (current) use of aspirin: Secondary | ICD-10-CM | POA: Insufficient documentation

## 2016-11-26 DIAGNOSIS — C182 Malignant neoplasm of ascending colon: Secondary | ICD-10-CM

## 2016-11-26 DIAGNOSIS — Z9889 Other specified postprocedural states: Secondary | ICD-10-CM | POA: Insufficient documentation

## 2016-11-26 DIAGNOSIS — K769 Liver disease, unspecified: Secondary | ICD-10-CM

## 2016-11-26 DIAGNOSIS — F329 Major depressive disorder, single episode, unspecified: Secondary | ICD-10-CM | POA: Insufficient documentation

## 2016-11-26 DIAGNOSIS — C189 Malignant neoplasm of colon, unspecified: Secondary | ICD-10-CM | POA: Insufficient documentation

## 2016-11-26 DIAGNOSIS — Z794 Long term (current) use of insulin: Secondary | ICD-10-CM | POA: Insufficient documentation

## 2016-11-26 DIAGNOSIS — E785 Hyperlipidemia, unspecified: Secondary | ICD-10-CM | POA: Insufficient documentation

## 2016-11-26 DIAGNOSIS — E1140 Type 2 diabetes mellitus with diabetic neuropathy, unspecified: Secondary | ICD-10-CM | POA: Insufficient documentation

## 2016-11-26 DIAGNOSIS — Z87891 Personal history of nicotine dependence: Secondary | ICD-10-CM | POA: Insufficient documentation

## 2016-11-26 DIAGNOSIS — M069 Rheumatoid arthritis, unspecified: Secondary | ICD-10-CM | POA: Insufficient documentation

## 2016-11-26 DIAGNOSIS — Z85828 Personal history of other malignant neoplasm of skin: Secondary | ICD-10-CM | POA: Insufficient documentation

## 2016-11-26 DIAGNOSIS — K5730 Diverticulosis of large intestine without perforation or abscess without bleeding: Secondary | ICD-10-CM | POA: Insufficient documentation

## 2016-11-26 DIAGNOSIS — M797 Fibromyalgia: Secondary | ICD-10-CM | POA: Insufficient documentation

## 2016-11-26 MED ORDER — CAPECITABINE 500 MG PO TABS: 1000.0000 mg/m2 | ORAL_TABLET | Freq: Two times a day (BID) | ORAL | 5 refills | Status: DC

## 2016-11-26 MED ORDER — ONDANSETRON HCL 8 MG PO TABS: 8.0000 mg | ORAL_TABLET | Freq: Three times a day (TID) | ORAL | 1 refills | Status: DC | PRN

## 2016-11-26 NOTE — Telephone Encounter (Signed)
Called to introduce myself to the patient.  We went over her medications.  She is ok with me calling her to teach over the phone and then I will give her the information when she comes in for her follow up appt.

## 2016-11-26 NOTE — Patient Instructions (Addendum)
[place] at [facility] Discharge Instructions  RECOMMENDATIONS MADE BY THE CONSULTANT AND ANY TEST RESULTS WILL BE SENT TO YOUR REFERRING PHYSICIAN.  You were seen today by Dr. [name] Follow up in 3 weeks with lab work See Amy up front for appointments   Thank you for choosing [place] at [facility] to provide your oncology and hematology care.  To afford each patient quality time with our provider, please arrive at least 15 minutes before your scheduled appointment time.    If you have a lab appointment with the [facility] please come in thru the  Main Entrance and check in at the main information desk  You need to re-schedule your appointment should you arrive 10 or more minutes late.  We strive to give you quality time with our providers, and arriving late affects you and other patients whose appointments are after yours.  Also, if you no show three or more times for appointments you may be dismissed from the clinic at the providers discretion.     Again, thank you for choosing [facility].  Our hope is that these requests will decrease the amount of time that you wait before being seen by our physicians.       _____________________________________________________________  Should you have questions after your visit to [facility], please contact our office at [phone] between the hours of 8:30 a.m. and 4:30 p.m.  Voicemails left after 4:30 p.m. will not be returned until the following business day.  For prescription refill requests, have your pharmacy contact our office.       Resources For Cancer Patients and their Caregivers ? American Cancer Society: Can assist with transportation, wigs, general needs, runs Look Good Feel Better.        ([phone] 088-6534 ? Cancer Care: Provides financial assistance, online support groups, medication/co-pay assistance.  1-800-813-HOPE 6031094531) ? [place] Assists [place] cancer patients and their families through emotional , educational and financial support.  (770)005-5975 ? [place] DSS Where to apply for food stamps, Medicaid and utility assistance. 706-478-0551 ? [employer]: Transportation to medical appointments. 252-286-3450 ? Social Security Administration: May apply for disability if have a Stage IV cancer. 508-224-6352 (925)686-6634 ? [employer], Disability and Transit Services: Assists with nutrition, care and transit needs. [place] Support Programs: @10RELATIVEDAYS @ > Cancer Support Group  2nd Tuesday of the month 1pm-2pm, Journey Room  > Creative Journey  3rd Tuesday of the month 1130am-1pm, Journey Room  > Look Good Feel Better  1st Wednesday of the month 10am-12 noon, Journey Room (Call American Cancer Society to register [phone])     Xeloda    Generic Name: Capecitabine (ka pe SITE a been) Brand Name: Xeloda Capecitabine is the generic name for the trade name drug Xeloda. In some cases, health care professionals may use the trade name Xeloda when referring to the generic drug name capecitabine. Drug Type: Capecitabine is an anti-cancer ("antineoplastic" or "cytotoxic") chemotherapy drug. Capecitabine is classified as an "antimetabolite." (For more detail, see "How Capecitabine Works" section below). What This Drug Is Used For: Colon or rectal cancer  Metastatic breast cancer  Esophageal, gastric, hepatobiliary, neuroendocrine, pancreatic, ovarian, fallopian tube, peritoneal or unknown primary cancers (off-label use) Note: If a drug has been approved for one use, physicians may elect to use this same drug for other problems if they believe it may be helpful. How This  Drug Is Given: Taken as a pill by mouth.  Take after food (within 30 minutes of a meal) with water. (Usually taken in a divided dose 12 hours apart).  Tablets come in 2 sizes; 150mg  and  500mg .  Do not crush, chew or dissolve tablets.  If you miss a dose, skip the missed dose and go back to your normal time. Do not take 2 doses at the same time or extra doses. The amount of capecitabine that you will receive depends on many factors, including your height and weight, your general health or other health problems, and the type of cancer or condition being treated. Your doctor will determine your dose and schedule. Side Effects: Important things to remember about the side effects of capecitabine: Most people will not experience all of the capecitabine side effects listed.  Capecitabine side effects are often predictable in terms of their onset, duration, and severity.  Capecitabine side effects will improve after therapy is complete.  Capecitabine side effects may be quite manageable. There are many options to minimize or prevent the side effects of capecitabine. The following side effects are common (occurring in greater than 30%) for patients taking capecitabine: Low white blood cell count (This can put you at increased risk for infection)  Low red blood cell count (anemia)  Nadir: Meaning low point, nadir is the point in time between chemotherapy cycles in which you experience low blood counts.  Onset: N/A  Nadir: 10-14 days  Recovery: N/A Hand-foot syndrome (Palmar-plantar erythrodysesthesia or PPE) - skin rash, swelling, redness, pain and/or peeling of the skin on the palms of hands and soles of feet. Usually mild, has started as early as 2 weeks after start of treatment. May require reductions in the dose of the medication)  Diarrhea  Elevated liver enzymes (increased bilirubin levels) (see liver problems)  Fatigue  Nausea and vomiting  Rash & itching  Abdominal pain These side effects are less common side effects (occurring in about 10-29%) of patients receiving capecitabine: Poor appetite  Low platelet count (This can put you at increased risk for bleeding)  Mouth  sores  Fever  Swelling of the feet and ankles  Eye irritation  Constipation  Back, muscle, joint, bone pane (see pain)  Headache  GI Motility disorder  Numbness or tingling (hands or feet) Not all side effects are listed above. Some that are rare (occurring in less than 10% of patients) are not listed here. However, you should always inform your health care provider if you experience any unusual symptoms. When to contact your doctor or health care provider: Contact your health care provider immediately, day or night, if you should experience any of the following symptoms: Fever of 100.4 F (38 C) or higher, chills (possible signs of infection) The following symptoms require medical attention, but are not an emergency. Contact your health care provider within 24 hours of noticing any of the following: Nausea (interferes with ability to eat and unrelieved with prescribed medication).  Vomiting (vomiting more than 4-5 times in a 24 hour period)  Diarrhea (4-6 episodes in a 24-hour period)  Unusual bleeding or bruising  Black or tarry stools, or blood in your stools or urine  Constipation  Extreme fatigue (unable to carry on self-care activities)  Mouth sores (painful redness, swelling or ulcers)  Swelling, redness and/or pain in one leg or arm and not the other  Yellowing of the skin or eyes  Tingling or burning, redness, swelling of the palms of the hands  or soles of the feet.  Confusion, loss of balance, excessive sleepiness Always inform your health care provider if you experience any unusual symptoms. Precautions: Before starting capecitabine treatment, make sure you tell your doctor about any other medications you are taking (including prescription, over-the-counter, vitamins, herbal remedies, etc.). Do not take aspirin, products containing aspirin unless your doctor specifically permits this.  Avoid use of antacids within 2 hours of taking capecitabine.  If you are on warfarin  (Coumadin) as a blood-thinner, adjustments may need to be made to your dose based on blood work.  Capecitabine may be inadvisable if you have had a hypersensitivity (allergic) reaction to fluorouracil.  Do not receive any kind of immunization or vaccination without your doctor's approval while taking capecitabine.  Inform your health care professional if you are pregnant or may be pregnant prior to starting this treatment. Pregnancy category D (capecitabine may be hazardous to the fetus. Women who are pregnant or become pregnant must be advised of the potential hazard to the fetus).  For both men and women: Do not conceive a child (get pregnant) while taking capecitabine. Barrier methods of contraception, such as condoms, are recommended. Discuss with your doctor when you may safely become pregnant or conceive a child after therapy.  Do not breast feed while taking capecitabine. Self-Care Tips: Drink at least two to three quarts of fluid every 24 hours, unless you are instructed otherwise.  You may be at risk of infection so try to avoid crowds or people with colds, and report fever or any other signs of infection immediately to you health care provider.  Wash your hands often.  To help treat/prevent mouth sores, use a soft toothbrush, and rinse three times a day with 1/2 to 1 teaspoon of baking soda and/or 1/2 to 1 teaspoon of salt mixed with 8 ounces of water.  Use an electric razor and a soft toothbrush to minimize bleeding.  Avoid contact sports or activities that could cause injury.  To reduce nausea, take anti-nausea medications as prescribed by your doctor, and eat small, frequent meals.  Prevention of hand-foot syndrome. Modification of normal activities of daily living to reduce friction and heat exposure to hands and feet, as much as possible during treatment with capecitabine. (for more information see - Managing side effects: hand foot syndrome).  Keeps palms of hands and soles of feet  moist using emollients such as Aveeno, Udder cream, Lubriderm or Bag Balm.  Follow regimen of anti-diarrhea medication as prescribed by your health care professional.  Eat foods that may help reduce diarrhea (see managing side effects - diarrhea).  Avoid sun exposure. Wear SPF 17 (or higher) sunblock and protective clothing.  You may experience drowsiness or dizziness; avoid driving or engaging in tasks that require alertness until your response to the drug is known.  In general, drinking alcoholic beverages should be kept to a minimum or avoided completely. You should discuss this with your doctor.  Get plenty of rest.  Maintain good nutrition.  If you experience symptoms or side effects, be sure to discuss them with your health care team. They can prescribe medications and/or offer other suggestions that are effective in managing such problems. Monitoring and Testing: You will be checked regularly by your doctor while you are taking capecitabine, to monitor side effects and check your response to therapy. Periodic blood work to monitor your complete blood count (CBC) as well as the function of other organs (such as your kidneys and liver) will also be  ordered by your doctor. How This Drug Works: Cancerous tumors are characterized by cell division, which is no longer controlled as it is in normal tissue. "Normal" cells stop dividing when they come into contact with like cells, a mechanism known as contact inhibition. Cancerous cells lose this ability. Cancer cells no longer have the normal checks and balances in place that control and limit cell division. The process of cell division, whether normal or cancerous cells, is through the cell cycle. The cell cycle goes from the resting phase, through active growing phases, and then to mitosis (division). The ability of chemotherapy to kill cancer cells depends on its ability to halt cell division. Usually, the drugs work by damaging the RNA or DNA that  tells the cell how to copy itself in division. If the cells are unable to divide, they die. The faster the cells are dividing, the more likely it is that chemotherapy will kill the cells, causing the tumor to shrink. They also induce cell suicide (self-death or apoptosis). Chemotherapy drugs that affect cells only when they are dividing are called cell-cycle specific. Chemotherapy drugs that affect cells when they are at rest are called cell-cycle non-specific. The scheduling of chemotherapy is set based on the type of cells, rate at which they divide, and the time at which a given drug is likely to be effective. This is why chemotherapy is typically given in cycles. Chemotherapy is most effective at killing cells that are rapidly dividing. Unfortunately, chemotherapy does not know the difference between the cancerous cells and the normal cells. The "normal" cells will grow back and be healthy but in the meantime, side effects occur. The "normal" cells most commonly affected by chemotherapy are the blood cells, the cells in the mouth, stomach and bowel, and the hair follicles; resulting in low blood counts, mouth sores, nausea, diarrhea, and/or hair loss. Different drugs may affect different parts of the body. Capecitabine belongs to the category of chemotherapy called antimetabolites. Antimetabolites are very similar to normal substances within the cell. When the cells incorporate these substances into the cellular metabolism, they are unable to divide. Antimetabolites are cell-cycle specific. They attack cells at very specific phases in the cycle. Antimetabolites are classified according to the substances with which they interfere. Note: We strongly encourage you to talk with your health care professional about your specific medical condition and treatments. The information contained in this website is meant to be helpful and educational, but is not a substitute for medical advice.

## 2016-11-26 NOTE — Progress Notes (Signed)
HEMATOLOGY/ONCOLOGY PROGRESS NOTE  Date of Service: 11/26/2016  Patient Care Team: Earney Mallet, MD as PCP - General (Family Medicine) Danie Binder, MD as Consulting Physician (Gastroenterology) Dr Aviva Signs MD (general surgery)   CHIEF COMPLAINTS/PURPOSE OF CONSULTATION:  Newly diagnosed colon cancer   HISTORY OF PRESENTING ILLNESS:  Wanda Curry is a 71 y.o. female who returns for follow up of colon cancer. Pathology results of the colon flexure mass were consistent with adenocarcinoma. CT of the abdomen and pelvis on 10/08/2016 showed a 3.2 cm indeterminate low attenuation mass in the central liver. Patient was seen by Dr. Arnoldo Morale and had a partial colectomy on 10/10/2016. Pathology shows  a 2.1 cm grade 2 adenocarcinoma of the ascending colon with invasion through the muscularis propria into the peri-colorectal tissues.   She is accompanied by her sister and daughter today. She is doing well. Pt currently suffers from fibromyalgia and rheumatoid arthritis which causes some fatigue. Her daughter says that she has had some anxiety attacks recently, since her cancer diagnosis. She wouldn't like to try medication unless it gets worse. Denies chest pain, SOB, abdominal pain, loss of appetite, blood in stool, melena, diarrhea, or any other concerns.   MEDICAL HISTORY:  Past Medical History:  Diagnosis Date  . Anemia   . Arthritis    RA  . Cancer ([place])    skin cancer  . Depression   . Diabetes mellitus without complication ([place])   . Fibromyalgia   . GERD (gastroesophageal reflux disease)   . Heart murmur    ECHO scheduled 05-25-2015  . Hyperlipidemia   . Neuropathy ([place])   . PONV (postoperative nausea and vomiting)     SURGICAL HISTORY: Past Surgical History:  Procedure Laterality Date  . ABDOMINAL HYSTERECTOMY    . BIOPSY  09/23/2016   Procedure: BIOPSY;  Surgeon: Danie Binder, MD;  [facility];  Service: Endoscopy;;  hepatic flexure mass  .  CHOLECYSTECTOMY    . COLONOSCOPY WITH PROPOFOL N/A 09/23/2016   Procedure: COLONOSCOPY WITH PROPOFOL;  Surgeon: Danie Binder, MD;  [facility]: [facility];  Service: Endoscopy;  Laterality: N/A;  7:30 am  . HARDWARE REMOVAL Right 05/30/2015   Procedure: HARDWARE REMOVAL;  Surgeon: Ninetta Lights, MD;  Location: [facility];  Service: Orthopedics;  Laterality: Right;  . PARTIAL COLECTOMY N/A 10/10/2016   Procedure: PARTIAL COLECTOMY;  Surgeon: Aviva Signs, MD;  Location: [facility];  Service: General;  Laterality: N/A;  . planter warts Bilateral    both feet  . POLYPECTOMY  09/23/2016   Procedure: POLYPECTOMY;  Surgeon: Danie Binder, MD;  Location: [facility];  Service: Endoscopy;;  transverse colon polyps times 2, rectal polyp  . skin grafts     due to planter warts  . TOTAL KNEE ARTHROPLASTY Right 05/30/2015   Procedure: RIGHT TOTAL KNEE ARTHROPLASTY;  Surgeon: Ninetta Lights, MD;  Location: [facility];  Service: Orthopedics;  Laterality: Right;    SOCIAL HISTORY: Social History   Social History  . Marital status: Widowed    Spouse name: N/A  . Number of children: N/A  . Years of education: N/A   Occupational History  . Not on file.   Social History Main Topics  . Smoking status: Former Smoker    Packs/day: 1.00    Years: 40.00    Types: Cigarettes    Quit date: 08/18/2004  . Smokeless tobacco: Never Used  . Alcohol use No  . Drug use: No  .  Sexual activity: Not Currently    Birth control/ protection: Surgical   Other Topics Concern  . Not on file   Social History Narrative  . No narrative on file    FAMILY HISTORY: Family History  Problem Relation Age of Onset  . Colon cancer Neg Hx     ALLERGIES:  is allergic to adhesive [tape]; sulfa antibiotics; and erythromycin.  MEDICATIONS:  Current Outpatient Prescriptions  Medication Sig Dispense Refill  . acetaminophen ([employer] PAIN RELIEF EXTRA STRENGTH) 500 MG tablet Take 500 mg by mouth every 6 (six) hours as needed (for  pain.).    Marland Kitchen aspirin EC 325 MG tablet Take 325 mg by mouth daily.    Marland Kitchen atorvastatin (LIPITOR) 20 MG tablet Take 20 mg by mouth daily.    . Calcium Carb-Cholecalciferol (CALCIUM + D3) 600-200 MG-UNIT TABS Take 1 tablet by mouth 2 (two) times daily.  0  . carvedilol (COREG) 6.25 MG tablet Take 6.25 mg by mouth 2 (two) times daily with a meal.    . folic acid (FOLVITE) 1 MG tablet Take 1 mg by mouth daily.    Marland Kitchen gabapentin (NEURONTIN) 600 MG tablet Take 600 mg by mouth 3 (three) times daily.    Marland Kitchen GLUCOSAMINE-CHONDROITIN PO Take 1 tablet by mouth 2 (two) times daily.    . hydrochlorothiazide (HYDRODIURIL) 25 MG tablet Take 25 mg by mouth daily.    . Insulin Glargine (BASAGLAR KWIKPEN [place]) Inject 42 Units into the skin every morning.    . magnesium oxide (MAG-OX) 400 (241.3 Mg) MG tablet Take 1 tablet (400 mg total) by mouth daily. 60 tablet 0  . methotrexate (RHEUMATREX) 2.5 MG tablet Take 20 mg by mouth every Sunday. Caution:Chemotherapy. Protect from light.     . Milnacipran HCl (SAVELLA) 100 MG TABS tablet Take 100 mg by mouth 2 (two) times daily.    Marland Kitchen NOVOLOG MIX 70/30 FLEXPEN (70-30) 100 UNIT/ML FlexPen INJECT 30 UNITS SUBCUTANEOUSLY TWICE DAILY AT BREAKFAST AND DINNER  3  . omeprazole (PRILOSEC) 20 MG capsule Take 40 mg by mouth daily.   3  . rOPINIRole (REQUIP) 1 MG tablet Take 1 mg by mouth 3 (three) times daily.     No current facility-administered medications for this visit.    Review of Systems  Constitutional: Positive for malaise/fatigue.       No loss of appetite   HENT: Negative.   Eyes: Negative.   Respiratory: Negative.  Negative for shortness of breath.   Cardiovascular: Negative.  Negative for chest pain.  Gastrointestinal: Negative.  Negative for abdominal pain, blood in stool, diarrhea and melena.  Genitourinary: Negative.   Musculoskeletal: Positive for joint pain (RA) and myalgias (fibromyalgia).  Skin: Negative.   Neurological: Negative.   Endo/Heme/Allergies:  Negative.   Psychiatric/Behavioral: The patient is nervous/anxious.   All other systems reviewed and are negative. 14 point review of systems was performed and is negative except as detailed under history of present illness and above  PHYSICAL EXAMINATION: ECOG PERFORMANCE STATUS: 1 - Symptomatic but completely ambulatory  Vitals:   11/26/16 1037  BP: (!) 117/53  Pulse: 90  Resp: 18  Temp: 98.7 F (37.1 C)   Filed Weights   11/26/16 1037  Weight: 245 lb 12.8 oz (111.5 kg)   .Body mass index is 40.28 kg/m.   Physical Exam  Constitutional: She is oriented to person, place, and time and well-developed, well-nourished, and in no distress.  HENT:  Head: Normocephalic and atraumatic.  Eyes: EOM are normal.  Pupils are equal, round, and reactive to light.  Neck: Normal range of motion. Neck supple.  Cardiovascular: Normal rate, regular rhythm and normal heart sounds.   Pulmonary/Chest: Effort normal and breath sounds normal.  Abdominal: Soft. Bowel sounds are normal.  Musculoskeletal: Normal range of motion.  Neurological: She is alert and oriented to person, place, and time. Gait normal.  Skin: Skin is warm and dry.  Nursing note and vitals reviewed.  LABORATORY DATA:  I have reviewed the data as listed  CBC Latest Ref Rng & Units 11/07/2016 10/13/2016 10/12/2016  WBC 4.0 - 10.5 K/uL 11.2(H) 13.3(H) 14.2(H)  Hemoglobin 12.0 - 15.0 g/dL 11.3(L) 9.9(L) 10.6(L)  Hematocrit 36.0 - 46.0 % 35.2(L) 30.2(L) 32.8(L)  Platelets 150 - 400 K/uL 268 267 317   CMP Latest Ref Rng & Units 11/07/2016 10/13/2016 10/12/2016  Glucose 65 - 99 mg/dL 160(H) 187(H) 188(H)  BUN 6 - 20 mg/dL 18 12 9   Creatinine 0.44 - 1.00 mg/dL 0.92 0.76 0.84  Sodium 135 - 145 mmol/L 137 136 136  Potassium 3.5 - 5.1 mmol/L 3.8 3.5 3.6  Chloride 101 - 111 mmol/L 98(L) 98(L) 97(L)  CO2 22 - 32 mmol/L 31 31 28   Calcium 8.9 - 10.3 mg/dL 9.5 8.7(L) 8.6(L)  Total Protein 6.5 - 8.1 g/dL 7.1 - -  Total Bilirubin 0.3 - 1.2  mg/dL 0.6 - -  Alkaline Phos 38 - 126 U/L 50 - -  AST 15 - 41 U/L 28 - -  ALT 14 - 54 U/L 29 - -   PATHOLOGY:      Colonoscopy 09/23/2016 - One 25 mm, recently bleeding polyp in the proximal ascending colon, NOT REMOVED. - Heme positive stools/hematichezia due to malignant tumor at the hepatic flexure AND COLON POLYPS. Biopsied. - Diverticulosis in the sigmoid colon and in the descending colon.  RADIOGRAPHIC STUDIES: I have personally reviewed the radiological images as listed and agreed with the findings in the report.  Initial PET scan 11/17/2016 IMPRESSION: The hepatic lesion seen on the CT scan and MRI does not demonstrate hypermetabolism and is likely a benign entity.  Right maxillary sinus disease with associated hypermetabolism.  Scattered pulmonary nodules, likely benign.  MRI Liver 10/28/2016 IMPRESSION: 1. Indeterminate lesion within the central RIGHT hepatic lobe demonstrates early enhancement and is therefore concerning. Differential would include atypical focal fatty infiltration, atypical focal nodular hyperplasia, and colorectal carcinoma metastasis. Low CEA and negative lymph nodes would weight against colorectal carcinoma metastasis. Pericolonic fat invasion is an aggressive feature. Lesion warrants further evaluation with FDG PET scan or follow-up MRI or CT in 1 to 3 months. 2. No additional evidence of metastatic disease.  ASSESSMENT & PLAN:  Cancer Staging Colon cancer [facility]) Staging form: Colon and Rectum, AJCC 8th Edition - Clinical: Stage IIA (cT3, cN0, cM0) - Signed by [name], MD on 11/26/2016   71 year old very pleasant lady with  1) Newly diagnosed Stage IIA colon adenocarcinoma. I have reviewed with the patient her staging scans. PET/CT is negative for other metastatic disease (including the liver lesion which was not hypermetabolic on PET). Discussed that she has stage II high risk disease  In the presence of lymphovascular invasion and  high grade (poorly differentiated) tumor. I have discussed treatment options including observation versus Xeloda vs 5FU/LV. After review of side effects of chemo, patient decided to go with adjuvant Capecitabine. She is over 89 y/o and therefore would not have a clear benefit of adding Oxaloplatin in the adjuvant setting for stage II  disease.  I plan to start her on Capecitabine 1000 mg/m2 PO qBID 1-14 every 3 weeks x 24 weeks. Prescribed anti-nausea medication. I encouraged her to take Imodium if she has any diarrhea. If she has some mouth sores or inflammation, I will prescribe her magic mouthwash.  -May need to hold methotrexate while the patient is on chemotherapy.  #2 Normocytic Anemia This appears to be a combination of iron deficiency from GI blood loss. Anemia could also partly be weak from her methotrexate use for rheumatoid arthritis. Patient was transfused on 2/27 with improvement in her hemoglobin. Hemoglobin has been trending up on its own. Continue to observe CBC. If it does not normalize on the next visit, will check iron studies.  Return to clinic in 2-3 weeks with M.D. with labs after PET CT scan and CT-guided liver biopsy to make final treatment decisions   She will return for follow up in 3 weeks to assess tolerability to adjuvant xeloda.  Orders Placed This Encounter  Procedures  . CBC with Differential    Standing Status:   Future    Standing Expiration Date:   12/31/2016  . Comprehensive metabolic panel    Standing Status:   Future    Standing Expiration Date:   12/31/2016  . CEA    Standing Status:   Future    Standing Expiration Date:   12/31/2016    All of the patients questions were answered with apparent satisfaction. The patient knows to call the clinic with any problems, questions or concerns.  This document serves as a record of services personally performed by [name], MD. It was created on her behalf by Martinique Casey, a trained medical scribe. The creation  of this record is based on the scribe's personal observations and the provider's statements to them. This document has been checked and approved by the attending provider.  I have reviewed the above documentation for accuracy and completeness and I agree with the above.  This note was electronically signed.   11/26/2016 10:59 AM

## 2016-11-27 ENCOUNTER — Encounter (HOSPITAL_COMMUNITY): Payer: Self-pay | Admitting: Emergency Medicine

## 2016-11-27 ENCOUNTER — Encounter (HOSPITAL_COMMUNITY): Payer: Medicare Other

## 2016-11-27 NOTE — Patient Instructions (Addendum)
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  We are going to treat you with Xeloda for 6 months.  You will take this for 14 days then have 7 days off.  This treatment is with curative intent.  You will see the doctor regularly throughout treatment.  The doctor monitors your response to treatment by the way you are feeling, your blood work, and scans periodically.   POTENTIAL SIDE EFFECTS OF TREATMENT:  Xeloda (capecitabine) dose is  2,000 mg twice a day.  You will take this for 14 days then have a week break.  Call me Anderson [facility] Nurse Navigator) at [phone], if you start to get any of the side effects from taking this medication.  Take Xeloda with water 30 minutes after breakfast and 30 minutes after your evening meal.  No pregnant, child bearing age people, or animals should come into contact with this drug. Even though this is in pill form - it is still very powerful!!! If you should be instructed to discontinue taking this drug, bring the drug into the [facility] and we will dispose of it in a safe manner. Chemotherapy is a biohazard and must be disposed of properly. Do not touch this pill much. It would be best if the caregiver wore gloves while handling. Do not put this pill in with the rest of your pills in a pill box. Keep them in a separate pill box.  Precautions: . Before starting capecitabine treatment, make sure you tell your doctor about any other medications you are taking (including prescription, over-the-counter, vitamins, herbal remedies, etc.). Do not take aspirin, products containing aspirin unless your doctor specifically permits this.  . Avoid use of antacids within 2 hours of taking capecitabine.  . If you are on warfarin (Coumadin) as a blood-thinner, adjustments may need to be made to your dose based on blood work.  . Capecitabine may be inadvisable if you have had a hypersensitivity  (allergic) reaction to fluorouracil.  . Do not receive any kind of immunization or vaccination without your doctor's approval while taking capecitabine.  . Inform your health care professional if you are pregnant or may be pregnant prior to starting this treatment. Pregnancy category D (capecitabine may be hazardous to the fetus. Women who are pregnant or become pregnant must be advised of the potential hazard to the fetus).  . For both men and women: Do not conceive a child (get pregnant) while taking capecitabine. Barrier methods of contraception, such as condoms, are recommended. Discuss with your doctor when you may safely become pregnant or conceive a child after therapy.  . Do not breast feed while taking capecitabine.   POTENTIAL SIDE EFFECTS OF TREATMENT:  Xeloda - diarrhea, hand-foot syndrome (hands/feet can get red/tender/and skin can peel). Avoid friction and hot environments on hands/feet. Wear cotton socks. Lotion twice a day to hands/fingers/feet/toes with Udder cream. Mucositis (inflammation of any mucosal membrane can develop -this can occur in the throat/mouth). Mouth sores, nausea/vomiting, anemia, fatigue can also develop.    The following side effects are common (occurring in greater than 30%) for patients taking capecitabine: . Low white blood cell count (This can put you at increased risk for infection)  . Low red blood cell count (anemia)  . Hand-foot syndrome (Palmar-plantar erythrodysesthesia or PPE) - skin rash, swelling, redness, pain and/or peeling of the skin on the palms of hands and soles of feet. Usually mild, has started  as early as 2 weeks after start of treatment. May require reductions in the dose of the medication)  . Diarrhea  . Elevated liver enzymes (increased bilirubin levels) (see liver problems)  . Fatigue  . Nausea and vomiting  . Rash & itching  . Abdominal pain These side effects are less common side effects (occurring in about 10-29%) of patients  receiving capecitabine: . Poor appetite  . Low platelet count (This can put you at increased risk for bleeding)  . Mouth sores  . Fever  . Swelling of the feet and ankles  . Eye irritation  . Constipation  . Back, muscle, joint, bone pane (see pain)  . Headache  . GI Motility disorder  . Numbness or tingling (hands or feet) Not all side effects are listed above. Some that are rare (occurring in less than 10% of patients) are not listed here. However, you should always inform your health care provider if you experience any unusual symptoms.   When To Contact Your Doctor or Health Care Provider: Contact your health care provider immediately, day or night, if you should experience any of the following symptoms: . Fever of 100.5 F (38 C) or higher, chills (possible signs of infection) The following symptoms require medical attention, but are not an emergency. Contact your health care provider within 24 hours of noticing any of the following: . Nausea (interferes with ability to eat and unrelieved with prescribed medication).  . Vomiting (vomiting more than 4-5 times in a 24 hour period)  . Diarrhea (4-6 episodes in a 24-hour period)  . Unusual bleeding or bruising  . Black or tarry stools, or blood in your stools or urine  . Constipation  . Extreme fatigue (unable to carry on self-care activities)  . Mouth sores (painful redness, swelling or ulcers)  . Swelling, redness and/or pain in one leg or arm and not the other  . Yellowing of the skin or eyes  . Tingling or burning, redness, swelling of the palms of the hands or soles of the feet.  . Confusion, loss of balance, excessive sleepiness Always inform your health care provider if you experience any unusual symptoms.   Self-Care Tips: . Drink at least two to three quarts of fluid every 24 hours, unless you are instructed otherwise.  . You may be at risk of infection so try to avoid crowds or people with colds, and report fever or  any other signs of infection immediately to you health care provider.  Wendee Copp your hands often.  . To help treat/prevent mouth sores, use a soft toothbrush, and rinse three times a day with 1/2 to 1 teaspoon of baking soda and/or 1/2 to 1 teaspoon of salt mixed with 8 ounces of water.  . Use an electric razor and a soft toothbrush to minimize bleeding.  . Avoid contact sports or activities that could cause injury.  . To reduce nausea, take anti-nausea medications as prescribed by your doctor, and eat small, frequent meals.  . Prevention of hand-foot syndrome. Modification of normal activities of daily living to reduce friction and heat exposure to hands and feet, as much as possible during treatment with capecitabine. (for more information see - Managing side effects: hand foot syndrome).  Marland Kitchen Keeps palms of hands and soles of feet moist using emollients such as Aveeno, Udder cream, Lubriderm or Bag Balm.  . Follow regimen of anti-diarrhea medication as prescribed by your health care professional.  . Eat foods that may help reduce diarrhea (  see managing side effects - diarrhea).  . Avoid sun exposure. Wear SPF 37 (or higher) sunblock and protective clothing.  . You may experience drowsiness or dizziness; avoid driving or engaging in tasks that require alertness until your response to the drug is known.  . In general, drinking alcoholic beverages should be kept to a minimum or avoided completely. You should discuss this with your doctor.  . Get plenty of rest.  . Maintain good nutrition.  . If you experience symptoms or side effects, be sure to discuss them with your health care team. They can prescribe medications and/or offer other suggestions that are effective in managing such problems.    SELF CARE ACTIVITIES WHILE ON CHEMOTHERAPY: Hydration Increase your fluid intake 48 hours prior to treatment and drink at least 8 to 12 cups (64 ounces) of water/decaff beverages per day after treatment.  You can still have your cup of coffee or soda but these beverages do not count as part of your 8 to 12 cups that you need to drink daily. No alcohol intake.  Medications Continue taking your normal prescription medication as prescribed.  If you start any new herbal or new supplements please let us know first to make sure it is safe.  Mouth Care Have teeth cleaned professionally before starting treatment. Keep dentures and partial plates clean. Use soft toothbrush and do not use mouthwashes that contain alcohol. Biotene is a good mouthwash that is available at most pharmacies or may be ordered by calling [phone]. Use warm salt water gargles (1 teaspoon salt per 1 quart warm water) before and after meals and at bedtime. Or you may rinse with 2 tablespoons of three-percent hydrogen peroxide mixed in eight ounces of water. If you are still having problems with your mouth or sores in your mouth please call the clinic. If you need dental work, please let the doctor know before you go for your appointment so that we can coordinate the best possible time for you in regards to your chemo regimen. You need to also let your dentist know that you are actively taking chemo. We may need to do labs prior to your dental appointment.   Skin Care Always use sunscreen that has not expired and with SPF (Sun Protection Factor) of 50 or higher. Wear hats to protect your head from the sun. Remember to use sunscreen on your hands, ears, face, & feet.  Use good moisturizing lotions such as udder cream, eucerin, or even Vaseline. Some chemotherapies can cause dry skin, color changes in your skin and nails.    . Avoid long, hot showers or baths. . Use gentle, fragrance-free soaps and laundry detergent. . Use moisturizers, preferably creams or ointments rather than lotions because the thicker consistency is better at preventing skin dehydration. Apply the cream or ointment within 15 minutes of showering. Reapply  moisturizer at night, and moisturize your hands every time after you wash them.  Hair Loss (if your doctor says your hair will fall out)  . If your doctor says that your hair is likely to fall out, decide before you begin chemo whether you want to wear a wig. You may want to shop before treatment to match your hair color. . Hats, turbans, and scarves can also camouflage hair loss, although some people prefer to leave their heads uncovered. If you go bare-headed outdoors, be sure to use sunscreen on your scalp. . Cut your hair short. It eases the inconvenience of shedding lots of hair, but  it also can reduce the emotional impact of watching your hair fall out. . Don't perm or color your hair during chemotherapy. Those chemical treatments are already damaging to hair and can enhance hair loss. Once your chemo treatments are done and your hair has grown back, it's OK to resume dyeing or perming hair. With chemotherapy, hair loss is almost always temporary. But when it grows back, it may be a different color or texture. In older adults who still had hair color before chemotherapy, the new growth may be completely gray.  Often, new hair is very fine and soft.  Infection Prevention Please wash your hands for at least 30 seconds using warm soapy water. Handwashing is the #1 way to prevent the spread of germs. Stay away from sick people or people who are getting over a cold. If you develop respiratory systems such as green/yellow mucus production or productive cough or persistent cough let us know and we will see if you need an antibiotic. It is a good idea to keep a pair of gloves on when going into grocery stores/[employer] to decrease your risk of coming into contact with germs on the carts, etc. Carry alcohol hand gel with you at all times and use it frequently if out in public. If your temperature reaches 100.5 or higher please call the clinic and let us know.  If it is after hours or on the weekend please go  to the ER if your temperature is over 100.5.  Please have your own personal thermometer at home to use.    Sex and bodily fluids If you are going to have sex, a condom must be used to protect the person that isn't taking chemotherapy. Chemo can decrease your libido (sex drive). For a few days after chemotherapy, chemotherapy can be excreted through your bodily fluids.  When using the toilet please close the lid and flush the toilet twice.  Do this for a few day after you have had chemotherapy.    Effects of chemotherapy on your sex life Some changes are simple and won't last long. They won't affect your sex life permanently. Sometimes you may feel: . too tired . not strong enough to be very active . sick or sore  . not in the mood . anxious or low Your anxiety might not seem related to sex. For example, you may be worried about the cancer and how your treatment is going. Or you may be worried about money, or about how you family are coping with your illness. These things can cause stress, which can affect your interest in sex. It's important to talk to your partner about how you feel. Remember - the changes to your sex life don't usually last long. There's usually no medical reason to stop having sex during chemo. The drugs won't have any long term physical effects on your performance or enjoyment of sex. Cancer can't be passed on to your partner during sex  Contraception It's important to use reliable contraception during treatment. Avoid getting pregnant while you or your partner are having chemotherapy. This is because the drugs may harm the baby. Sometimes chemotherapy drugs can leave a man or woman infertile.  This means you would not be able to have children in the future. You might want to talk to someone about permanent infertility. It can be very difficult to learn that you may no longer be able to have children. Some people find counselling helpful. There might be ways to preserve your  fertility, although this is easier for men than for women. You may want to speak to a fertility expert. You can talk about sperm banking or harvesting your eggs. You can also ask about other fertility options, such as donor eggs. If you have or have had breast cancer, your doctor might advise you not to take the contraceptive pill. This is because the hormones in it might affect the cancer.  It is not known for sure whether or not chemotherapy drugs can be passed on through semen or secretions from the vagina. Because of this some doctors advise people to use a barrier method if you have sex during treatment. This applies to vaginal, anal or oral sex. Generally, doctors advise a barrier method only for the time you are actually having the treatment and for about a week after your treatment. Advice like this can be worrying, but this does not mean that you have to avoid being intimate with your partner. You can still have close contact with your partner and continue to enjoy sex.  Animals If you have cats or birds we just ask that you not change the litter or change the cage.  Please have someone else do this for you while you are on chemotherapy.   Food Safety During and After Cancer Treatment Food safety is important for people both during and after cancer treatment. Cancer and cancer treatments, such as chemotherapy, radiation therapy, and stem cell/bone marrow transplantation, often weaken the immune system. This makes it harder for your body to protect itself from foodborne illness, also called food poisoning. Foodborne illness is caused by eating food that contains harmful bacteria, parasites, or viruses.  Foods to avoid Some foods have a higher risk of becoming tainted with bacteria. These include: Marland Kitchen Unwashed fresh fruit and vegetables, especially leafy vegetables that can hide dirt and other contaminants . Raw sprouts, such as alfalfa sprouts . Raw or undercooked beef, especially ground beef,  or other raw or undercooked meat and poultry . Fatty, fried, or spicy foods immediately before or after treatment.  These can sit heavy on your stomach and make you feel nauseous. . Raw or undercooked shellfish, such as oysters. . Sushi and sashimi, which often contain raw fish.  . Unpasteurized beverages, such as unpasteurized fruit juices, raw milk, raw yogurt, or cider . Undercooked eggs, such as soft boiled, over easy, and poached; raw, unpasteurized eggs; or foods made with raw egg, such as homemade raw cookie dough and homemade mayonnaise Simple steps for food safety Shop smart. . Do not buy food stored or displayed in an unclean area. . Do not buy bruised or damaged fruits or vegetables. . Do not buy cans that have cracks, dents, or bulges. . Pick up foods that can spoil at the end of your shopping trip and store them in a cooler on the way home. Prepare and clean up foods carefully. . Rinse all fresh fruits and vegetables under running water, and dry them with a clean towel or paper towel. . Clean the top of cans before opening them. . After preparing food, wash your hands for 20 seconds with hot water and soap. Pay special attention to areas between fingers and under nails. . Clean your utensils and dishes with hot water and soap. Marland Kitchen Disinfect your kitchen and cutting boards using 1 teaspoon of liquid, unscented bleach mixed into 1 quart of water.   Dispose of old food. . Eat canned and packaged food before its expiration date (the "use  by" or "best before" date). . Consume refrigerated leftovers within 3 to 4 days. After that time, throw out the food. Even if the food does not smell or look spoiled, it still may be unsafe. Some bacteria, such as Listeria, can grow even on foods stored in the refrigerator if they are kept for too long. Take precautions when eating out. . At restaurants, avoid buffets and salad bars where food sits out for a long time and comes in contact with many  people. Food can become contaminated when someone with a virus, often a norovirus, or another "bug" handles it. . Put any leftover food in a "to-go" container yourself, rather than having the server do it. And, refrigerate leftovers as soon as you get home. . Choose restaurants that are clean and that are willing to prepare your food as you order it cooked.     MEDICATIONS:                                                                                                                                                          Zofran/Ondansetron 8mg  tablet. Take 1 tablet every 8 hours as needed for nausea/vomiting. (#1 nausea med to take, this can constipate)   Over-the-Counter Meds:  Miralax 1 capful in 8 oz of fluid daily. May increase to two times a day if needed. This is a stool softener. If this doesn't work proceed you can add:  Senokot S-start with 1 tablet two times a day and increase to 4 tablets two times a day if needed. (total of 8 tablets in a 24 hour period). This is a stimulant laxative.   Call us if this does not help your bowels move.   Imodium 2mg  capsule. Take 2 capsules after the 1st loose stool and then 1 capsule every 2 hours until you go a total of 12 hours without having a loose stool. Call the [facility] if loose stools continue. If diarrhea occurs @ bedtime, take 2 capsules @ bedtime. Then take 2 capsules every 4 hours until morning. Call [facility].     Diarrhea Sheet  If you are having loose stools/diarrhea, please purchase Imodium and begin taking as outlined:  At the first sign of poorly formed or loose stools you should begin taking Imodium(loperamide) 2 mg capsules.  Take two caplets (4mg ) followed by one caplet (2mg ) every 2 hours until you have had no diarrhea for 12 hours.  During the night take two caplets (4mg ) at bedtime and continue every 4 hours during the night until the morning.  Stop taking Imodium only after there is no sign of diarrhea for 12  hours.    Always call the [facility] if you are having loose stools/diarrhea that you can't get under control.  Loose stools/disrrhea leads to dehydration (loss of  water) in your body.  We have other options of trying to get the loose stools/diarrhea to stopped but you must let us know!     Constipation Sheet *Miralax in 8 oz of fluid daily.  May increase to two times a day if needed.  This is a stool softener.  If this not enough to keep your bowel regular:  You can add:  *Senokot S, start with one tablet twice a day and can increase to 4 tablets twice a day if needed.  This is a stimulant laxative.   Sometimes when you take pain medication you need BOTH a medicine to keep your stool soft and a medicine to help your bowel push it out!  Please call if the above does not work for you.   Do not go more than 2 days without a bowel movement.  It is very important that you do not become constipated.  It will make you feel sick to your stomach (nausea) and can cause abdominal pain and vomiting.     SYMPTOMS TO REPORT AS SOON AS POSSIBLE AFTER TREATMENT:  FEVER GREATER THAN 100.5 F  CHILLS WITH OR WITHOUT FEVER  NAUSEA AND VOMITING THAT IS NOT CONTROLLED WITH YOUR NAUSEA MEDICATION  UNUSUAL SHORTNESS OF BREATH  UNUSUAL BRUISING OR BLEEDING  TENDERNESS IN MOUTH AND THROAT WITH OR WITHOUT PRESENCE OF ULCERS  URINARY PROBLEMS  BOWEL PROBLEMS  UNUSUAL RASH    What to do if you need assistance after hours or on the weekends: CALL [phone].  HOLD on the line, do not hang up.  You will hear multiple messages but at the end you will be connected with a nurse triage line.  They will contact the doctor if necessary.  Most of the time they will be able to assist you.  Do not call the hospital operator.     I have been informed and understand all of the instructions given to me and have received a copy. I have been instructed to call the clinic [phone] or my family  physician as soon as possible for continued medical care, if indicated. I do not have any more questions at this time but understand that I may call the [place] or the Patient Navigator at [phone] during office hours should I have questions or need assistance in obtaining follow-up care.

## 2016-11-27 NOTE — Progress Notes (Signed)
Pt taught about Xeloda and side effects over the phone.

## 2016-11-27 NOTE — Progress Notes (Signed)
Chemotherapy teaching pulled together. 

## 2016-12-03 ENCOUNTER — Telehealth (HOSPITAL_COMMUNITY): Payer: Self-pay | Admitting: Oncology

## 2016-12-03 NOTE — Telephone Encounter (Signed)
CALLED [facility] TO VERIFY XELODA SCRIPT

## 2016-12-12 ENCOUNTER — Telehealth (HOSPITAL_COMMUNITY): Payer: Self-pay | Admitting: Oncology

## 2016-12-12 NOTE — Telephone Encounter (Signed)
Rcvd a call from pts daughter stating she was told the copay was $366 not $0.

## 2016-12-12 NOTE — Telephone Encounter (Signed)
SPOKE WITH PTS DAUGHTER AND SHE STATED PT STILL HAS NOT RECVED HER XELODA. HER MOM HAD BEEN IN CONTACT WITH A PT ADVOCATE FROM HER INS [employer] TRYING TO GET THE RX. I WAS UNAWARE THAT THE PT DID NOT HAVE HER SCRIPT UNTIL TODAY. I CALLED [facility] AND [facility] WITH [facility] WHO CONFIRMED THAT THE SCRIPT WAS READY FOR DELIVERY BUT THE DID NOT HAVE THE PTS PHONE NUMBER.(NOT SURE WHY THE PHONE NUMBERS ARE ON THE SCRIPT!)   I CALLED SUSAN BACK TO LET HER KNOW THE SCRIPT IS READY WITH A ZERO COPAY AND TRANSFERRED HER TO [facility].

## 2016-12-16 ENCOUNTER — Telehealth (HOSPITAL_COMMUNITY): Payer: Self-pay | Admitting: Oncology

## 2016-12-16 ENCOUNTER — Telehealth (HOSPITAL_COMMUNITY): Payer: Self-pay | Admitting: Emergency Medicine

## 2016-12-16 NOTE — Telephone Encounter (Signed)
Called pt to tell her about the IV form of Xeloda since she can not afford the co-pay of the Xeloda.  She was asking if she could have the port placed here in [place].  I explained that she would have to have a consultation with the surgeon then she would have the port placed at the hospital as an outpatient procedure.  She wanted to know the copays and how much all of this was going to cost.  I do not know this information.  Then wanted to know how long for the treatments.  She would get 6 months worth of IV treatments every 2 weeks.  She will have an ambulatory pump that she carries home for 46 hours.  She wanted to know how much all of this would cost.  I told her that I did not know this.  She said she could not afford anything else coming in.  I told her Wanda Curry is our Development worker, community and handles those things.  I would talk with her tomorrow.  I spoke with Wanda Craze NP about the situation and she would like the patient to come in and talk with the provider about all of the options.  Appt made for 12/23/2016 at 11:30 am.  Pt verbalized understanding.

## 2016-12-16 NOTE — Telephone Encounter (Signed)
Rcvd a call from Pts ins advocate regarding pt not recving her Xeloda yet. Explained that pt could not afford the copay. Pt advocate was not made aware of that by pt. She added pt to our call and I advised pt that I would call her back after I discuss her situation with the drs.

## 2016-12-17 ENCOUNTER — Ambulatory Visit (HOSPITAL_COMMUNITY): Payer: Medicare Other | Admitting: Adult Health

## 2016-12-17 ENCOUNTER — Other Ambulatory Visit (HOSPITAL_COMMUNITY): Payer: Medicare Other

## 2016-12-23 ENCOUNTER — Ambulatory Visit (HOSPITAL_COMMUNITY): Payer: Medicare Other

## 2016-12-26 ENCOUNTER — Other Ambulatory Visit (HOSPITAL_COMMUNITY): Payer: Medicare Other

## 2016-12-26 ENCOUNTER — Ambulatory Visit (HOSPITAL_COMMUNITY): Payer: Medicare Other | Admitting: Adult Health

## 2016-12-31 ENCOUNTER — Encounter (HOSPITAL_COMMUNITY): Payer: Medicare Other

## 2016-12-31 ENCOUNTER — Encounter (HOSPITAL_COMMUNITY): Payer: Medicare Other | Attending: Hematology | Admitting: Oncology

## 2016-12-31 ENCOUNTER — Encounter (HOSPITAL_COMMUNITY): Payer: Self-pay

## 2016-12-31 VITALS — BP 154/60 | HR 82 | Temp 97.9°F | Resp 20 | Wt 247.0 lb

## 2016-12-31 DIAGNOSIS — Z7982 Long term (current) use of aspirin: Secondary | ICD-10-CM | POA: Diagnosis not present

## 2016-12-31 DIAGNOSIS — M069 Rheumatoid arthritis, unspecified: Secondary | ICD-10-CM | POA: Diagnosis not present

## 2016-12-31 DIAGNOSIS — K219 Gastro-esophageal reflux disease without esophagitis: Secondary | ICD-10-CM | POA: Insufficient documentation

## 2016-12-31 DIAGNOSIS — Z9889 Other specified postprocedural states: Secondary | ICD-10-CM | POA: Insufficient documentation

## 2016-12-31 DIAGNOSIS — Z85828 Personal history of other malignant neoplasm of skin: Secondary | ICD-10-CM | POA: Diagnosis not present

## 2016-12-31 DIAGNOSIS — Z794 Long term (current) use of insulin: Secondary | ICD-10-CM | POA: Insufficient documentation

## 2016-12-31 DIAGNOSIS — C183 Malignant neoplasm of hepatic flexure: Secondary | ICD-10-CM

## 2016-12-31 DIAGNOSIS — F329 Major depressive disorder, single episode, unspecified: Secondary | ICD-10-CM | POA: Insufficient documentation

## 2016-12-31 DIAGNOSIS — C189 Malignant neoplasm of colon, unspecified: Secondary | ICD-10-CM

## 2016-12-31 DIAGNOSIS — M797 Fibromyalgia: Secondary | ICD-10-CM | POA: Insufficient documentation

## 2016-12-31 DIAGNOSIS — K5730 Diverticulosis of large intestine without perforation or abscess without bleeding: Secondary | ICD-10-CM | POA: Diagnosis not present

## 2016-12-31 DIAGNOSIS — E785 Hyperlipidemia, unspecified: Secondary | ICD-10-CM | POA: Insufficient documentation

## 2016-12-31 DIAGNOSIS — E1140 Type 2 diabetes mellitus with diabetic neuropathy, unspecified: Secondary | ICD-10-CM | POA: Insufficient documentation

## 2016-12-31 DIAGNOSIS — D649 Anemia, unspecified: Secondary | ICD-10-CM | POA: Diagnosis not present

## 2016-12-31 DIAGNOSIS — C182 Malignant neoplasm of ascending colon: Secondary | ICD-10-CM | POA: Diagnosis not present

## 2016-12-31 DIAGNOSIS — Z9049 Acquired absence of other specified parts of digestive tract: Secondary | ICD-10-CM | POA: Diagnosis not present

## 2016-12-31 DIAGNOSIS — Z87891 Personal history of nicotine dependence: Secondary | ICD-10-CM | POA: Insufficient documentation

## 2016-12-31 DIAGNOSIS — K769 Liver disease, unspecified: Secondary | ICD-10-CM | POA: Insufficient documentation

## 2016-12-31 LAB — CBC WITH DIFFERENTIAL/PLATELET
Basophils Absolute: 0 10*3/uL (ref 0.0–0.1)
Basophils Relative: 0 %
Eosinophils Absolute: 0.3 10*3/uL (ref 0.0–0.7)
Eosinophils Relative: 3 %
HCT: 34.3 % — ABNORMAL LOW (ref 36.0–46.0)
Hemoglobin: 11.1 g/dL — ABNORMAL LOW (ref 12.0–15.0)
Lymphocytes Relative: 27 %
Lymphs Abs: 2.5 10*3/uL (ref 0.7–4.0)
MCH: 27.3 pg (ref 26.0–34.0)
MCHC: 32.4 g/dL (ref 30.0–36.0)
MCV: 84.5 fL (ref 78.0–100.0)
Monocytes Absolute: 0.1 10*3/uL (ref 0.1–1.0)
Monocytes Relative: 2 %
Neutro Abs: 6.4 10*3/uL (ref 1.7–7.7)
Neutrophils Relative %: 68 %
Platelets: 292 10*3/uL (ref 150–400)
RBC: 4.06 MIL/uL (ref 3.87–5.11)
RDW: 19.1 % — ABNORMAL HIGH (ref 11.5–15.5)
WBC: 9.4 10*3/uL (ref 4.0–10.5)

## 2016-12-31 LAB — COMPREHENSIVE METABOLIC PANEL
ALT: 29 U/L (ref 14–54)
AST: 24 U/L (ref 15–41)
Albumin: 3.6 g/dL (ref 3.5–5.0)
Alkaline Phosphatase: 47 U/L (ref 38–126)
Anion gap: 7 (ref 5–15)
BUN: 21 mg/dL — ABNORMAL HIGH (ref 6–20)
CO2: 32 mmol/L (ref 22–32)
Calcium: 9.1 mg/dL (ref 8.9–10.3)
Chloride: 100 mmol/L — ABNORMAL LOW (ref 101–111)
Creatinine, Ser: 0.95 mg/dL (ref 0.44–1.00)
GFR calc Af Amer: >60 mL/min (ref >60)
GFR calc non Af Amer: 58 mL/min — ABNORMAL LOW (ref >60)
Glucose, Bld: 96 mg/dL (ref 65–99)
Potassium: 4.1 mmol/L (ref 3.5–5.1)
Sodium: 139 mmol/L (ref 135–145)
Total Bilirubin: 0.9 mg/dL (ref 0.3–1.2)
Total Protein: 6.8 g/dL (ref 6.5–8.1)

## 2016-12-31 NOTE — Patient Instructions (Signed)
[place] at [facility] Discharge Instructions  RECOMMENDATIONS MADE BY THE CONSULTANT AND ANY TEST RESULTS WILL BE SENT TO YOUR REFERRING PHYSICIAN.  You were seen today by Dr. [name] Follow up in 4 weeks with lab work You will also have lab work today   Thank you for choosing [place] at [facility] to provide your oncology and hematology care.  To afford each patient quality time with our provider, please arrive at least 15 minutes before your scheduled appointment time.    If you have a lab appointment with the [facility] please come in thru the  Main Entrance and check in at the main information desk  You need to re-schedule your appointment should you arrive 10 or more minutes late.  We strive to give you quality time with our providers, and arriving late affects you and other patients whose appointments are after yours.  Also, if you no show three or more times for appointments you may be dismissed from the clinic at the providers discretion.     Again, thank you for choosing [facility].  Our hope is that these requests will decrease the amount of time that you wait before being seen by our physicians.       _____________________________________________________________  Should you have questions after your visit to [facility], please contact our office at [phone] between the hours of 8:30 a.m. and 4:30 p.m.  Voicemails left after 4:30 p.m. will not be returned until the following business day.  For prescription refill requests, have your pharmacy contact our office.       Resources For Cancer Patients and their Caregivers ? American Cancer Society: Can assist with transportation, wigs, general needs, runs Look Good Feel Better.        951-001-5559 ? Cancer Care: Provides financial assistance, online support groups, medication/co-pay assistance.  1-800-813-HOPE (904) 181-4887) ? [facility] Assists [place] cancer patients and their families through emotional , educational and financial support.  331 411 2122 ? [place] DSS Where to apply for food stamps, Medicaid and utility assistance. 810 854 8067 ? [employer]: Transportation to medical appointments. 704 368 3925 ? Social Security Administration: May apply for disability if have a Stage IV cancer. 2490941385 702-081-6852 ? [employer], Disability and Transit Services: Assists with nutrition, care and transit needs. [facility] Support Programs: @10RELATIVEDAYS @ > Cancer Support Group  2nd Tuesday of the month 1pm-2pm, Journey Room  > Creative Journey  3rd Tuesday of the month 1130am-1pm, Journey Room  > Look Good Feel Better  1st Wednesday of the month 10am-12 noon, Journey Room (Call [place] to register [phone])

## 2016-12-31 NOTE — Progress Notes (Signed)
HEMATOLOGY/ONCOLOGY PROGRESS NOTE  Date of Service: 12/31/2016  Patient Care Team: Earney Mallet, MD as PCP - General (Family Medicine) Danie Binder, MD as Consulting Physician (Gastroenterology) Dr Aviva Signs MD (general surgery)   CHIEF COMPLAINTS:  T3N0 colon cancer   ONCOLOGIC HISTORY:   Colon cancer ([place])   10/10/2016 Initial Diagnosis    Colon cancer ([place])     10/10/2016 Surgery    Partial colectomy by Dr. Arnoldo Morale  Pathology shows  a 2.1 cm grade 2 adenocarcinoma of the ascending colon with invasion through the muscularis propria into the peri-colorectal tissues.         11/17/2016 PET scan    The hepatic lesion seen on the CT scan and MRI does not demonstrate hypermetabolism and is likely a benign entity.  Right maxillary sinus disease with associated hypermetabolism.  Scattered pulmonary nodules, likely benign. No follow-up needed if patient is low-risk (and has no known or suspected primary neoplasm). Non-contrast chest CT can be considered in 12 months if patient is high-risk.       12/25/2016 -  Adjuvant Chemotherapy    Xeloda 2000mg  PO BID take for 14 days out of 21 days. Plan for total of 24 weeks of treatment.       HISTORY OF PRESENTING ILLNESS:  Wanda Curry is a 71 y.o. female who returns for follow up of colon cancer. Pathology results of the colon flexure mass were consistent with adenocarcinoma. CT of the abdomen and pelvis on 10/08/2016 showed a 3.2 cm indeterminate low attenuation mass in the central liver. Patient was seen by Dr. Arnoldo Morale and had a partial colectomy on 10/10/2016. Pathology shows  a 2.1 cm grade 2 adenocarcinoma of the ascending colon with invasion through the muscularis propria into the peri-colorectal tissues.   Patient presented for continued follow-up today. She is doing well. She started taking her Xeloda on 12/25/16. She has been tolerating Xeloda well without any side effects. She denies any changes in taste, decreased  appetite, fatigue, any issues with the skin on the palms of her hands or soles of her feet. She has been gaining weight. She complains of cramping in her toes and ankles bilaterally at nighttime for which she takes magnesium and has been improving. Otherwise she is doing well and has no complaints.    MEDICAL HISTORY:  Past Medical History:  Diagnosis Date  . Anemia   . Arthritis    RA  . Cancer ([place])    skin cancer  . Depression   . Diabetes mellitus without complication ([place])   . Fibromyalgia   . GERD (gastroesophageal reflux disease)   . Heart murmur    ECHO scheduled 05-25-2015  . Hyperlipidemia   . Neuropathy   . PONV (postoperative nausea and vomiting)     SURGICAL HISTORY: Past Surgical History:  Procedure Laterality Date  . ABDOMINAL HYSTERECTOMY    . BIOPSY  09/23/2016   Procedure: BIOPSY;  Surgeon: Danie Binder, MD;  [facility];  Service: Endoscopy;;  hepatic flexure mass  . CHOLECYSTECTOMY    . COLONOSCOPY WITH PROPOFOL N/A 09/23/2016   Procedure: COLONOSCOPY WITH PROPOFOL;  Surgeon: Danie Binder, MD;  [facility];  Service: Endoscopy;  Laterality: N/A;  7:30 am  . HARDWARE REMOVAL Right 05/30/2015   Procedure: HARDWARE REMOVAL;  Surgeon: Ninetta Lights, MD;  Location: [place];  Service: Orthopedics;  Laterality: Right;  . PARTIAL COLECTOMY N/A 10/10/2016   Procedure: PARTIAL COLECTOMY;  Surgeon: Aviva Signs,  MD;  Location: [facility];  Service: General;  Laterality: N/A;  . planter warts Bilateral    both feet  . POLYPECTOMY  09/23/2016   Procedure: POLYPECTOMY;  Surgeon: Danie Binder, MD;  [facility];  Service: Endoscopy;;  transverse colon polyps times 2, rectal polyp  . skin grafts     due to planter warts  . TOTAL KNEE ARTHROPLASTY Right 05/30/2015   Procedure: RIGHT TOTAL KNEE ARTHROPLASTY;  Surgeon: Ninetta Lights, MD;  Location: [place];  Service: Orthopedics;  Laterality: Right;    SOCIAL HISTORY: Social History    Social History  . Marital status: Widowed    Spouse name: N/A  . Number of children: N/A  . Years of education: N/A   Occupational History  . Not on file.   Social History Main Topics  . Smoking status: Former Smoker    Packs/day: 1.00    Years: 40.00    Types: Cigarettes    Quit date: 08/18/2004  . Smokeless tobacco: Never Used  . Alcohol use No  . Drug use: No  . Sexual activity: Not Currently    Birth control/ protection: Surgical   Other Topics Concern  . Not on file   Social History Narrative  . No narrative on file    FAMILY HISTORY: Family History  Problem Relation Age of Onset  . Colon cancer Neg Hx     ALLERGIES:  is allergic to adhesive [tape]; sulfa antibiotics; and erythromycin.  MEDICATIONS:  Current Outpatient Prescriptions  Medication Sig Dispense Refill  . acetaminophen ([employer] PAIN RELIEF EXTRA STRENGTH) 500 MG tablet Take 500 mg by mouth every 6 (six) hours as needed (for pain.).    Marland Kitchen aspirin EC 325 MG tablet Take 325 mg by mouth daily.    Marland Kitchen atorvastatin (LIPITOR) 20 MG tablet Take 20 mg by mouth daily.    . Calcium Carb-Cholecalciferol (CALCIUM + D3) 600-200 MG-UNIT TABS Take 1 tablet by mouth 2 (two) times daily.  0  . capecitabine (XELODA) 500 MG tablet Take 4 tablets (2,000 mg total) by mouth 2 (two) times daily after a meal. Take on days 1-14. Repeat every 21 days. 112 tablet 5  . carvedilol (COREG) 6.25 MG tablet Take 6.25 mg by mouth 2 (two) times daily with a meal.    . folic acid (FOLVITE) 1 MG tablet Take 1 mg by mouth daily.    Marland Kitchen gabapentin (NEURONTIN) 600 MG tablet Take 600 mg by mouth 3 (three) times daily.    Marland Kitchen GLUCOSAMINE-CHONDROITIN PO Take 1 tablet by mouth 2 (two) times daily.    . hydrochlorothiazide (HYDRODIURIL) 25 MG tablet Take 25 mg by mouth daily.    . Insulin Glargine (BASAGLAR KWIKPEN [place]) Inject 42 Units into the skin every morning.    . magnesium oxide (MAG-OX) 400 (241.3 Mg) MG tablet Take 1 tablet (400 mg total) by mouth  daily. 60 tablet 0  . methotrexate (RHEUMATREX) 2.5 MG tablet Take 20 mg by mouth every Sunday. Caution:Chemotherapy. Protect from light.     . Milnacipran HCl (SAVELLA) 100 MG TABS tablet Take 100 mg by mouth 2 (two) times daily.    . Multiple Vitamins-Minerals (CENTRUM SILVER PO) Take 1 tablet by mouth daily.    Marland Kitchen NOVOLOG MIX 70/30 FLEXPEN (70-30) 100 UNIT/ML FlexPen INJECT 30 UNITS SUBCUTANEOUSLY TWICE DAILY AT BREAKFAST AND DINNER  3  . omeprazole (PRILOSEC) 40 MG capsule TAKE ONE CAPSULE BY MOUTH ONCE A DAY  3  . ondansetron (ZOFRAN)  8 MG tablet Take 1 tablet (8 mg total) by mouth every 8 (eight) hours as needed for nausea or vomiting. 80 tablet 1  . rOPINIRole (REQUIP) 1 MG tablet Take 1 mg by mouth 3 (three) times daily.     No current facility-administered medications for this visit.    Review of Systems  Constitutional: Negative for malaise/fatigue.       No loss of appetite   HENT: Negative.   Eyes: Negative.   Respiratory: Negative.  Negative for shortness of breath.   Cardiovascular: Negative.  Negative for chest pain.  Gastrointestinal: Negative.  Negative for abdominal pain, blood in stool, diarrhea and melena.  Genitourinary: Negative.   Musculoskeletal: Negative for joint pain (RA) and myalgias (fibromyalgia).       Cramping in her toes and ankles at night  Skin: Negative.   Neurological: Negative.   Endo/Heme/Allergies: Negative.   Psychiatric/Behavioral: The patient is not nervous/anxious.   All other systems reviewed and are negative. 14 point review of systems was performed and is negative except as detailed under history of present illness and above  PHYSICAL EXAMINATION: ECOG PERFORMANCE STATUS: 1 - Symptomatic but completely ambulatory  Vitals:   12/31/16 0928  BP: (!) 154/60  Pulse: 82  Resp: 20  Temp: 97.9 F (36.6 C)   Filed Weights   12/31/16 0928  Weight: 247 lb (112 kg)   .Body mass index is 40.48 kg/m.   Physical Exam  Constitutional: She  is oriented to person, place, and time and well-developed, well-nourished, and in no distress.  HENT:  Head: Normocephalic and atraumatic.  Eyes: EOM are normal. Pupils are equal, round, and reactive to light.  Neck: Normal range of motion. Neck supple.  Cardiovascular: Normal rate, regular rhythm and normal heart sounds.   Pulmonary/Chest: Effort normal and breath sounds normal.  Abdominal: Soft. Bowel sounds are normal.  Musculoskeletal: Normal range of motion.  Neurological: She is alert and oriented to person, place, and time. Gait normal.  Skin: Skin is warm and dry.  Nursing note and vitals reviewed.  LABORATORY DATA:  I have reviewed the data as listed  CBC Latest Ref Rng & Units 11/07/2016 10/13/2016 10/12/2016  WBC 4.0 - 10.5 K/uL 11.2(H) 13.3(H) 14.2(H)  Hemoglobin 12.0 - 15.0 g/dL 11.3(L) 9.9(L) 10.6(L)  Hematocrit 36.0 - 46.0 % 35.2(L) 30.2(L) 32.8(L)  Platelets 150 - 400 K/uL 268 267 317   CMP Latest Ref Rng & Units 11/07/2016 10/13/2016 10/12/2016  Glucose 65 - 99 mg/dL 160(H) 187(H) 188(H)  BUN 6 - 20 mg/dL 18 12 9   Creatinine 0.44 - 1.00 mg/dL 0.92 0.76 0.84  Sodium 135 - 145 mmol/L 137 136 136  Potassium 3.5 - 5.1 mmol/L 3.8 3.5 3.6  Chloride 101 - 111 mmol/L 98(L) 98(L) 97(L)  CO2 22 - 32 mmol/L 31 31 28   Calcium 8.9 - 10.3 mg/dL 9.5 8.7(L) 8.6(L)  Total Protein 6.5 - 8.1 g/dL 7.1 - -  Total Bilirubin 0.3 - 1.2 mg/dL 0.6 - -  Alkaline Phos 38 - 126 U/L 50 - -  AST 15 - 41 U/L 28 - -  ALT 14 - 54 U/L 29 - -   PATHOLOGY:      Colonoscopy 09/23/2016 - One 25 mm, recently bleeding polyp in the proximal ascending colon, NOT REMOVED. - Heme positive stools/hematichezia due to malignant tumor at the hepatic flexure AND COLON POLYPS. Biopsied. - Diverticulosis in the sigmoid colon and in the descending colon.  RADIOGRAPHIC STUDIES: I have personally reviewed  the radiological images as listed and agreed with the findings in the report.  Initial PET scan  11/17/2016 IMPRESSION: The hepatic lesion seen on the CT scan and MRI does not demonstrate hypermetabolism and is likely a benign entity.  Right maxillary sinus disease with associated hypermetabolism.  Scattered pulmonary nodules, likely benign.  MRI Liver 10/28/2016 IMPRESSION: 1. Indeterminate lesion within the central RIGHT hepatic lobe demonstrates early enhancement and is therefore concerning. Differential would include atypical focal fatty infiltration, atypical focal nodular hyperplasia, and colorectal carcinoma metastasis. Low CEA and negative lymph nodes would weight against colorectal carcinoma metastasis. Pericolonic fat invasion is an aggressive feature. Lesion warrants further evaluation with FDG PET scan or follow-up MRI or CT in 1 to 3 months. 2. No additional evidence of metastatic disease.  ASSESSMENT & PLAN:  Cancer Staging Colon cancer [facility]) Staging form: Colon and Rectum, AJCC 8th Edition - Clinical: Stage IIA (cT3, cN0, cM0) - Signed by [name], MD on 11/26/2016   71 year old very pleasant lady with  1) Stage IIA colon adenocarcinoma.  She has stage II high risk disease in the presence of lymphovascular invasion and high grade (poorly differentiated) tumor. Patient has opted for adjuvant treatment with Xeloda 1000 mg/m2 PO qBID 1-14 every 3 weeks x 24 weeks. She started taking her Xeloda on 12/25/16. -Tolerating xeloda well. Continue treatment as planned.  2) Normocytic Anemia This appears to be a combination of iron deficiency from GI blood loss. Anemia could also partly be weak from her methotrexate use for rheumatoid arthritis. Patient was transfused on 2/27 with improvement in her hemoglobin. Hemoglobin has been trending up on its own; last lab draw on 11/07/16 demonstrated hemoglobin is up to 11.3 g/dL. Continue to observe CBC, she will get labs today.   LABS:  CBC, CMP, CEA today and on next visit. DISPO: RTC in 4 weeks for follow up.   All of  the patients questions were answered with apparent satisfaction. The patient knows to call the clinic with any problems, questions or concerns.  This document serves as a record of services personally performed by [name], MD. It was created on her behalf by Martinique Casey, a trained medical scribe. The creation of this record is based on the scribe's personal observations and the provider's statements to them. This document has been checked and approved by the attending provider.  I have reviewed the above documentation for accuracy and completeness and I agree with the above.  This note was electronically signed.   12/31/2016 9:57 AM

## 2016-12-31 NOTE — Progress Notes (Signed)
Patient reports taking Xeloda for approximately 10 days now. She is taking it as prescribed and has not missed any doses.  Patient does report having severe muscle cramps in her feet and legs at night time. Provider is aware.

## 2017-01-01 LAB — CEA: CEA: 3.9 ng/mL (ref 0.0–4.7)

## 2017-01-26 ENCOUNTER — Other Ambulatory Visit (HOSPITAL_COMMUNITY): Payer: Self-pay | Admitting: *Deleted

## 2017-01-26 DIAGNOSIS — C189 Malignant neoplasm of colon, unspecified: Secondary | ICD-10-CM

## 2017-01-28 ENCOUNTER — Encounter (HOSPITAL_COMMUNITY): Payer: Medicare Other

## 2017-01-28 ENCOUNTER — Encounter (HOSPITAL_COMMUNITY): Payer: Medicare Other | Attending: Oncology | Admitting: Oncology

## 2017-01-28 ENCOUNTER — Encounter (HOSPITAL_COMMUNITY): Payer: Self-pay | Admitting: Oncology

## 2017-01-28 VITALS — BP 127/54 | HR 92 | Temp 97.9°F | Resp 16 | Wt 247.2 lb

## 2017-01-28 DIAGNOSIS — D649 Anemia, unspecified: Secondary | ICD-10-CM

## 2017-01-28 DIAGNOSIS — Z9189 Other specified personal risk factors, not elsewhere classified: Secondary | ICD-10-CM

## 2017-01-28 DIAGNOSIS — C189 Malignant neoplasm of colon, unspecified: Secondary | ICD-10-CM | POA: Insufficient documentation

## 2017-01-28 DIAGNOSIS — C183 Malignant neoplasm of hepatic flexure: Secondary | ICD-10-CM

## 2017-01-28 LAB — CBC WITH DIFFERENTIAL/PLATELET
Basophils Absolute: 0 10*3/uL (ref 0.0–0.1)
Basophils Relative: 0 %
Eosinophils Absolute: 0.2 10*3/uL (ref 0.0–0.7)
Eosinophils Relative: 2 %
HCT: 31.3 % — ABNORMAL LOW (ref 36.0–46.0)
Hemoglobin: 10.5 g/dL — ABNORMAL LOW (ref 12.0–15.0)
Lymphocytes Relative: 19 %
Lymphs Abs: 1.9 10*3/uL (ref 0.7–4.0)
MCH: 29.9 pg (ref 26.0–34.0)
MCHC: 33.5 g/dL (ref 30.0–36.0)
MCV: 89.2 fL (ref 78.0–100.0)
Monocytes Absolute: 0.3 10*3/uL (ref 0.1–1.0)
Monocytes Relative: 3 %
Neutro Abs: 7.5 10*3/uL (ref 1.7–7.7)
Neutrophils Relative %: 76 %
Platelets: 129 10*3/uL — ABNORMAL LOW (ref 150–400)
RBC: 3.51 MIL/uL — ABNORMAL LOW (ref 3.87–5.11)
RDW: 24.1 % — ABNORMAL HIGH (ref 11.5–15.5)
WBC: 9.9 10*3/uL (ref 4.0–10.5)

## 2017-01-28 LAB — COMPREHENSIVE METABOLIC PANEL
ALT: 27 U/L (ref 14–54)
AST: 27 U/L (ref 15–41)
Albumin: 3.3 g/dL — ABNORMAL LOW (ref 3.5–5.0)
Alkaline Phosphatase: 49 U/L (ref 38–126)
Anion gap: 12 (ref 5–15)
BUN: 21 mg/dL — ABNORMAL HIGH (ref 6–20)
CO2: 28 mmol/L (ref 22–32)
Calcium: 8.6 mg/dL — ABNORMAL LOW (ref 8.9–10.3)
Chloride: 98 mmol/L — ABNORMAL LOW (ref 101–111)
Creatinine, Ser: 1.01 mg/dL — ABNORMAL HIGH (ref 0.44–1.00)
GFR calc Af Amer: >60 mL/min (ref >60)
GFR calc non Af Amer: 54 mL/min — ABNORMAL LOW (ref >60)
Glucose, Bld: 218 mg/dL — ABNORMAL HIGH (ref 65–99)
Potassium: 3.7 mmol/L (ref 3.5–5.1)
Sodium: 138 mmol/L (ref 135–145)
Total Bilirubin: 1.1 mg/dL (ref 0.3–1.2)
Total Protein: 6.1 g/dL — ABNORMAL LOW (ref 6.5–8.1)

## 2017-01-28 NOTE — Progress Notes (Signed)
HEMATOLOGY/ONCOLOGY PROGRESS NOTE  Date of Service: 01/28/2017  Patient Care Team: Earney Mallet, MD as PCP - General (Family Medicine) Danie Binder, MD as Consulting Physician (Gastroenterology) Dr Aviva Signs MD (general surgery)   CHIEF COMPLAINTS:  T3N0 colon cancer   ONCOLOGIC HISTORY:   Colon cancer ([place])   10/10/2016 Initial Diagnosis    Colon cancer ([place])     10/10/2016 Surgery    Partial colectomy by Dr. Arnoldo Morale  Pathology shows  a 2.1 cm grade 2 adenocarcinoma of the ascending colon with invasion through the muscularis propria into the peri-colorectal tissues.         11/17/2016 PET scan    The hepatic lesion seen on the CT scan and MRI does not demonstrate hypermetabolism and is likely a benign entity.  Right maxillary sinus disease with associated hypermetabolism.  Scattered pulmonary nodules, likely benign. No follow-up needed if patient is low-risk (and has no known or suspected primary neoplasm). Non-contrast chest CT can be considered in 12 months if patient is high-risk.       12/25/2016 -  Adjuvant Chemotherapy    Xeloda 2000mg  PO BID take for 14 days out of 21 days. Plan for total of 24 weeks of treatment.       HISTORY OF PRESENTING ILLNESS:  Wanda Curry is a 71 y.o. female who returns for follow up of colon cancer. Pathology results of the colon flexure mass were consistent with adenocarcinoma. CT of the abdomen and pelvis on 10/08/2016 showed a 3.2 cm indeterminate low attenuation mass in the central liver. Patient was seen by Dr. Arnoldo Morale and had a partial colectomy on 10/10/2016. Pathology shows  a 2.1 cm grade 2 adenocarcinoma of the ascending colon with invasion through the muscularis propria into the peri-colorectal tissues.   Patient presented for continued follow-up today. She is doing well. She started taking her Xeloda on 12/25/16. She has been tolerating Xeloda well without any side effects. She denies any changes in taste, decreased  appetite, fatigue, any issues with the skin on the palms of her hands or soles of her feet. She has been gaining weight. She complains of cramping in her toes and ankles bilaterally at nighttime for which she takes magnesium and has been improving. Otherwise she is doing well and has no complaints. January 28, 2017  Patient is here for ongoing evaluation and treatment consideration he had after starting second cycle patient has developed blisters in the lower extremity pain for feet and unable to really walk.  Has soreness in the mouth.  Diarrhea off and on.  Also rash in upper extremity. Patient is feeling weak and tired.   MEDICAL HISTORY:  Past Medical History:  Diagnosis Date  . Anemia   . Arthritis    RA  . Cancer ([place])    skin cancer  . Depression   . Diabetes mellitus without complication ([place])   . Fibromyalgia   . GERD (gastroesophageal reflux disease)   . Heart murmur    ECHO scheduled 05-25-2015  . Hyperlipidemia   . Neuropathy   . PONV (postoperative nausea and vomiting)     SURGICAL HISTORY: Past Surgical History:  Procedure Laterality Date  . ABDOMINAL HYSTERECTOMY    . BIOPSY  09/23/2016   Procedure: BIOPSY;  Surgeon: Danie Binder, MD;  [facility];  Service: Endoscopy;;  hepatic flexure mass  . CHOLECYSTECTOMY    . COLONOSCOPY WITH PROPOFOL N/A 09/23/2016   Procedure: COLONOSCOPY WITH PROPOFOL;  Surgeon: Danie Binder,  MD;  Location: [facility];  Service: Endoscopy;  Laterality: N/A;  7:30 am  . HARDWARE REMOVAL Right 05/30/2015   Procedure: HARDWARE REMOVAL;  Surgeon: Ninetta Lights, MD;  Location: [facility];  Service: Orthopedics;  Laterality: Right;  . PARTIAL COLECTOMY N/A 10/10/2016   Procedure: PARTIAL COLECTOMY;  Surgeon: Aviva Signs, MD;  Location: [facility];  Service: General;  Laterality: N/A;  . planter warts Bilateral    both feet  . POLYPECTOMY  09/23/2016   Procedure: POLYPECTOMY;  Surgeon: Danie Binder, MD;  Location[facility];   Service: Endoscopy;;  transverse colon polyps times 2, rectal polyp  . skin grafts     due to planter warts  . TOTAL KNEE ARTHROPLASTY Right 05/30/2015   Procedure: RIGHT TOTAL KNEE ARTHROPLASTY;  Surgeon: Ninetta Lights, MD;  Location: [place];  Service: Orthopedics;  Laterality: Right;    SOCIAL HISTORY: Social History   Social History  . Marital status: Widowed    Spouse name: N/A  . Number of children: N/A  . Years of education: N/A   Occupational History  . Not on file.   Social History Main Topics  . Smoking status: Former Smoker    Packs/day: 1.00    Years: 40.00    Types: Cigarettes    Quit date: 08/18/2004  . Smokeless tobacco: Never Used  . Alcohol use No  . Drug use: No  . Sexual activity: Not Currently    Birth control/ protection: Surgical   Other Topics Concern  . Not on file   Social History Narrative  . No narrative on file    FAMILY HISTORY: Family History  Problem Relation Age of Onset  . Colon cancer Neg Hx     ALLERGIES:  is allergic to adhesive [tape]; sulfa antibiotics; and erythromycin.  MEDICATIONS:  Current Outpatient Prescriptions  Medication Sig Dispense Refill  . acetaminophen ([employer] PAIN RELIEF EXTRA STRENGTH) 500 MG tablet Take 500 mg by mouth every 6 (six) hours as needed (for pain.).    Marland Kitchen aspirin EC 325 MG tablet Take 325 mg by mouth daily.    Marland Kitchen atorvastatin (LIPITOR) 20 MG tablet Take 20 mg by mouth daily.    . Calcium Carb-Cholecalciferol (CALCIUM + D3) 600-200 MG-UNIT TABS Take 1 tablet by mouth 2 (two) times daily.  0  . capecitabine (XELODA) 500 MG tablet Take 4 tablets (2,000 mg total) by mouth 2 (two) times daily after a meal. Take on days 1-14. Repeat every 21 days. 112 tablet 5  . carvedilol (COREG) 6.25 MG tablet Take 6.25 mg by mouth 2 (two) times daily with a meal.    . folic acid (FOLVITE) 1 MG tablet Take 1 mg by mouth daily.    Marland Kitchen gabapentin (NEURONTIN) 600 MG tablet Take 600 mg by mouth 3 (three) times daily.    Marland Kitchen  GLUCOSAMINE-CHONDROITIN PO Take 1 tablet by mouth 2 (two) times daily.    . hydrochlorothiazide (HYDRODIURIL) 25 MG tablet Take 25 mg by mouth daily.    . Insulin Glargine (BASAGLAR KWIKPEN [place]) Inject 42 Units into the skin every morning.    . magnesium oxide (MAG-OX) 400 (241.3 Mg) MG tablet Take 1 tablet (400 mg total) by mouth daily. 60 tablet 0  . methotrexate (RHEUMATREX) 2.5 MG tablet Take 20 mg by mouth every Sunday. Caution:Chemotherapy. Protect from light.     . Milnacipran HCl (SAVELLA) 100 MG TABS tablet Take 100 mg by mouth 2 (two) times daily.    Marland Kitchen  Multiple Vitamins-Minerals (CENTRUM SILVER PO) Take 1 tablet by mouth daily.    Marland Kitchen NOVOLOG MIX 70/30 FLEXPEN (70-30) 100 UNIT/ML FlexPen INJECT 30 UNITS SUBCUTANEOUSLY TWICE DAILY AT BREAKFAST AND DINNER  3  . omeprazole (PRILOSEC) 40 MG capsule TAKE ONE CAPSULE BY MOUTH ONCE A DAY  3  . ondansetron (ZOFRAN) 8 MG tablet Take 1 tablet (8 mg total) by mouth every 8 (eight) hours as needed for nausea or vomiting. 80 tablet 1  . rOPINIRole (REQUIP) 1 MG tablet Take 1 mg by mouth 3 (three) times daily.     No current facility-administered medications for this visit.    Review of Systems  Constitutional: Negative for malaise/fatigue.       No loss of appetite   HENT: Positive for sore throat.   Eyes: Negative.   Respiratory: Negative.  Negative for shortness of breath.   Cardiovascular: Negative.  Negative for chest pain.  Gastrointestinal: Negative for abdominal pain, blood in stool and melena.  Genitourinary: Negative.   Musculoskeletal: Negative for joint pain (RA) and myalgias (fibromyalgia).       Cramping in her toes and ankles at night  Skin: Positive for rash.  Neurological: Negative.   Endo/Heme/Allergies: Negative.   Psychiatric/Behavioral: The patient is not nervous/anxious.   All other systems reviewed and are negative. 14 point review of systems was performed and is negative except as detailed under history of present  illness and above  PHYSICAL EXAMINATION: ECOG PERFORMANCE STATUS: 1 - Symptomatic but completely ambulatory  Vitals:   01/28/17 1000  BP: (!) 127/54  Pulse: 92  Resp: 16  Temp: 97.9 F (36.6 C)   Filed Weights   01/28/17 1000  Weight: 247 lb 3.2 oz (112.1 kg)   .Body mass index is 40.51 kg/m.   Physical Exam  Constitutional: She is oriented to person, place, and time and well-developed, well-nourished, and in no distress.  HENT:  Head: Normocephalic and atraumatic.  Eyes: EOM are normal. Pupils are equal, round, and reactive to light.  Neck: Normal range of motion. Neck supple.  Cardiovascular: Normal rate, regular rhythm and normal heart sounds.   Pulmonary/Chest: Effort normal and breath sounds normal.  Abdominal: Soft. Bowel sounds are normal.  Musculoskeletal: Normal range of motion.  Neurological: She is alert and oriented to person, place, and time. Gait normal.  Skin: Skin is warm and dry.  Nursing note and vitals reviewed. Patient had blisters in lower extremity maculopapular rash in upper extremity.  Mucositis grade 1 LABORATORY DATA:  I have reviewed the data as listed  CBC Latest Ref Rng & Units 01/28/2017 12/31/2016 11/07/2016  WBC 4.0 - 10.5 K/uL 9.9 9.4 11.2(H)  Hemoglobin 12.0 - 15.0 g/dL 10.5(L) 11.1(L) 11.3(L)  Hematocrit 36.0 - 46.0 % 31.3(L) 34.3(L) 35.2(L)  Platelets 150 - 400 K/uL 129(L) 292 268   CMP Latest Ref Rng & Units 01/28/2017 12/31/2016 11/07/2016  Glucose 65 - 99 mg/dL 218(H) 96 160(H)  BUN 6 - 20 mg/dL 21(H) 21(H) 18  Creatinine 0.44 - 1.00 mg/dL 1.01(H) 0.95 0.92  Sodium 135 - 145 mmol/L 138 139 137  Potassium 3.5 - 5.1 mmol/L 3.7 4.1 3.8  Chloride 101 - 111 mmol/L 98(L) 100(L) 98(L)  CO2 22 - 32 mmol/L 28 32 31  Calcium 8.9 - 10.3 mg/dL 8.6(L) 9.1 9.5  Total Protein 6.5 - 8.1 g/dL 6.1(L) 6.8 7.1  Total Bilirubin 0.3 - 1.2 mg/dL 1.1 0.9 0.6  Alkaline Phos 38 - 126 U/L 49 47 50  AST 15 -  41 U/L 27 24 28   ALT 14 - 54 U/L 27 29 29     PATHOLOGY:      Colonoscopy 09/23/2016 - One 25 mm, recently bleeding polyp in the proximal ascending colon, NOT REMOVED. - Heme positive stools/hematichezia due to malignant tumor at the hepatic flexure AND COLON POLYPS. Biopsied. - Diverticulosis in the sigmoid colon and in the descending colon.  RADIOGRAPHIC STUDIES: I have personally reviewed the radiological images as listed and agreed with the findings in the report.  Initial PET scan 11/17/2016 IMPRESSION: The hepatic lesion seen on the CT scan and MRI does not demonstrate hypermetabolism and is likely a benign entity.  Right maxillary sinus disease with associated hypermetabolism.  Scattered pulmonary nodules, likely benign.  MRI Liver 10/28/2016 IMPRESSION: 1. Indeterminate lesion within the central RIGHT hepatic lobe demonstrates early enhancement and is therefore concerning. Differential would include atypical focal fatty infiltration, atypical focal nodular hyperplasia, and colorectal carcinoma metastasis. Low CEA and negative lymph nodes would weight against colorectal carcinoma metastasis. Pericolonic fat invasion is an aggressive feature. Lesion warrants further evaluation with FDG PET scan or follow-up MRI or CT in 1 to 3 months. 2. No additional evidence of metastatic disease.  ASSESSMENT & PLAN:  Cancer Staging Colon cancer [facility]) Staging form: Colon and Rectum, AJCC 8th Edition - Clinical: Stage IIA (cT3, cN0, cM0) - Signed by [name], MD on 11/26/2016   71 year old very pleasant lady with  1) Stage IIA colon adenocarcinoma.  She has stage II high risk disease in the presence of lymphovascular invasion and high grade (poorly differentiated) tumor. Patient has opted for adjuvant treatment with Xeloda 1000 mg/m2 PO qBID 1-14 every 3 weeks x 24 weeks. She started taking her Xeloda on 12/25/16. -Tolerating xeloda well. Continue treatment as planned.  2) Normocytic Anemia This appears to be a  combination of iron deficiency from GI blood loss. Anemia could also partly be weak from her methotrexate use for rheumatoid arthritis. Patient was transfused on 2/27 with improvement in her hemoglobin. Hemoglobin has been trending up on its own; last lab draw on 11/07/16 demonstrated hemoglobin is up to 11.3 g/dL. Continue to observe CBC, she will get labs today.     All of the patients questions were answered with apparent satisfaction. The patient knows to call the clinic with any problems, questions or concerns.  Patient has developed side effects secondary to Xeloda and most of the side effects are grade 2 over 3  At this point in time I would like to hold off any further Xeloda treatment until side effect results to grade 1 Patient will be reevaluated in one week Depending on resolution of all the side effect a dose reduction can be considered. I will alternate chemotherapy can be considered.  01/28/2017 11:15 AM

## 2017-01-28 NOTE — Progress Notes (Signed)
HEMATOLOGY/ONCOLOGY PROGRESS NOTE  Date of Service: 01/28/2017  Patient Care Team: Earney Mallet, MD as PCP - General (Family Medicine) Danie Binder, MD as Consulting Physician (Gastroenterology) Dr Aviva Signs MD (general surgery)   CHIEF COMPLAINTS:  T3N0 colon cancer   ONCOLOGIC HISTORY:   Colon cancer ([place])   10/10/2016 Initial Diagnosis    Colon cancer ([place])     10/10/2016 Surgery    Partial colectomy by Dr. Arnoldo Morale  Pathology shows  a 2.1 cm grade 2 adenocarcinoma of the ascending colon with invasion through the muscularis propria into the peri-colorectal tissues.         11/17/2016 PET scan    The hepatic lesion seen on the CT scan and MRI does not demonstrate hypermetabolism and is likely a benign entity.  Right maxillary sinus disease with associated hypermetabolism.  Scattered pulmonary nodules, likely benign. No follow-up needed if patient is low-risk (and has no known or suspected primary neoplasm). Non-contrast chest CT can be considered in 12 months if patient is high-risk.       12/25/2016 -  Adjuvant Chemotherapy    Xeloda 2000mg  PO BID take for 14 days out of 21 days. Plan for total of 24 weeks of treatment.       HISTORY OF PRESENTING ILLNESS:  Wanda Curry is a 71 y.o. female who returns for follow up of colon cancer. Pathology results of the colon flexure mass were consistent with adenocarcinoma. CT of the abdomen and pelvis on 10/08/2016 showed a 3.2 cm indeterminate low attenuation mass in the central liver. Patient was seen by Dr. Arnoldo Morale and had a partial colectomy on 10/10/2016. Pathology shows  a 2.1 cm grade 2 adenocarcinoma of the ascending colon with invasion through the muscularis propria into the peri-colorectal tissues.   Patient presented for continued follow-up today. She is doing well. She started taking her Xeloda on 12/25/16. She has been tolerating Xeloda well without any side effects. She denies any changes in taste, decreased  appetite, fatigue, any issues with the skin on the palms of her hands or soles of her feet. She has been gaining weight. She complains of cramping in her toes and ankles bilaterally at nighttime for which she takes magnesium and has been improving. Otherwise she is doing well and has no complaints.    MEDICAL HISTORY:  Past Medical History:  Diagnosis Date  . Anemia   . Arthritis    RA  . Cancer ([place])    skin cancer  . Depression   . Diabetes mellitus without complication ([place])   . Fibromyalgia   . GERD (gastroesophageal reflux disease)   . Heart murmur    ECHO scheduled 05-25-2015  . Hyperlipidemia   . Neuropathy   . PONV (postoperative nausea and vomiting)     SURGICAL HISTORY: Past Surgical History:  Procedure Laterality Date  . ABDOMINAL HYSTERECTOMY    . BIOPSY  09/23/2016   Procedure: BIOPSY;  Surgeon: Danie Binder, MD;  [facility];  Service: Endoscopy;;  hepatic flexure mass  . CHOLECYSTECTOMY    . COLONOSCOPY WITH PROPOFOL N/A 09/23/2016   Procedure: COLONOSCOPY WITH PROPOFOL;  Surgeon: Danie Binder, MD;  [facility];  Service: Endoscopy;  Laterality: N/A;  7:30 am  . HARDWARE REMOVAL Right 05/30/2015   Procedure: HARDWARE REMOVAL;  Surgeon: Ninetta Lights, MD;  Location: [facility];  Service: Orthopedics;  Laterality: Right;  . PARTIAL COLECTOMY N/A 10/10/2016   Procedure: PARTIAL COLECTOMY;  Surgeon: Aviva Signs,  MD;  Location: [facility];  Service: General;  Laterality: N/A;  . planter warts Bilateral    both feet  . POLYPECTOMY  09/23/2016   Procedure: POLYPECTOMY;  Surgeon: Danie Binder, MD;  [facility];  Service: Endoscopy;;  transverse colon polyps times 2, rectal polyp  . skin grafts     due to planter warts  . TOTAL KNEE ARTHROPLASTY Right 05/30/2015   Procedure: RIGHT TOTAL KNEE ARTHROPLASTY;  Surgeon: Ninetta Lights, MD;  Location: [facility];  Service: Orthopedics;  Laterality: Right;    SOCIAL HISTORY: Social History    Social History  . Marital status: Widowed    Spouse name: N/A  . Number of children: N/A  . Years of education: N/A   Occupational History  . Not on file.   Social History Main Topics  . Smoking status: Former Smoker    Packs/day: 1.00    Years: 40.00    Types: Cigarettes    Quit date: 08/18/2004  . Smokeless tobacco: Never Used  . Alcohol use No  . Drug use: No  . Sexual activity: Not Currently    Birth control/ protection: Surgical   Other Topics Concern  . Not on file   Social History Narrative  . No narrative on file    FAMILY HISTORY: Family History  Problem Relation Age of Onset  . Colon cancer Neg Hx     ALLERGIES:  is allergic to adhesive [tape]; sulfa antibiotics; and erythromycin.  MEDICATIONS:  Current Outpatient Prescriptions  Medication Sig Dispense Refill  . acetaminophen ([employer] PAIN RELIEF EXTRA STRENGTH) 500 MG tablet Take 500 mg by mouth every 6 (six) hours as needed (for pain.).    Marland Kitchen aspirin EC 325 MG tablet Take 325 mg by mouth daily.    Marland Kitchen atorvastatin (LIPITOR) 20 MG tablet Take 20 mg by mouth daily.    . Calcium Carb-Cholecalciferol (CALCIUM + D3) 600-200 MG-UNIT TABS Take 1 tablet by mouth 2 (two) times daily.  0  . capecitabine (XELODA) 500 MG tablet Take 4 tablets (2,000 mg total) by mouth 2 (two) times daily after a meal. Take on days 1-14. Repeat every 21 days. 112 tablet 5  . carvedilol (COREG) 6.25 MG tablet Take 6.25 mg by mouth 2 (two) times daily with a meal.    . folic acid (FOLVITE) 1 MG tablet Take 1 mg by mouth daily.    Marland Kitchen gabapentin (NEURONTIN) 600 MG tablet Take 600 mg by mouth 3 (three) times daily.    Marland Kitchen GLUCOSAMINE-CHONDROITIN PO Take 1 tablet by mouth 2 (two) times daily.    . hydrochlorothiazide (HYDRODIURIL) 25 MG tablet Take 25 mg by mouth daily.    . Insulin Glargine (BASAGLAR KWIKPEN [place]) Inject 42 Units into the skin every morning.    . magnesium oxide (MAG-OX) 400 (241.3 Mg) MG tablet Take 1 tablet (400 mg total) by mouth  daily. 60 tablet 0  . methotrexate (RHEUMATREX) 2.5 MG tablet Take 20 mg by mouth every Sunday. Caution:Chemotherapy. Protect from light.     . Milnacipran HCl (SAVELLA) 100 MG TABS tablet Take 100 mg by mouth 2 (two) times daily.    . Multiple Vitamins-Minerals (CENTRUM SILVER PO) Take 1 tablet by mouth daily.    Marland Kitchen NOVOLOG MIX 70/30 FLEXPEN (70-30) 100 UNIT/ML FlexPen INJECT 30 UNITS SUBCUTANEOUSLY TWICE DAILY AT BREAKFAST AND DINNER  3  . omeprazole (PRILOSEC) 40 MG capsule TAKE ONE CAPSULE BY MOUTH ONCE A DAY  3  . ondansetron (ZOFRAN)  8 MG tablet Take 1 tablet (8 mg total) by mouth every 8 (eight) hours as needed for nausea or vomiting. 80 tablet 1  . rOPINIRole (REQUIP) 1 MG tablet Take 1 mg by mouth 3 (three) times daily.     No current facility-administered medications for this visit.    Review of Systems  Constitutional: Negative for malaise/fatigue.       No loss of appetite   HENT: Negative.   Eyes: Negative.   Respiratory: Negative.  Negative for shortness of breath.   Cardiovascular: Negative.  Negative for chest pain.  Gastrointestinal: Negative.  Negative for abdominal pain, blood in stool, diarrhea and melena.  Genitourinary: Negative.   Musculoskeletal: Negative for joint pain (RA) and myalgias (fibromyalgia).       Cramping in her toes and ankles at night  Skin: Negative.   Neurological: Negative.   Endo/Heme/Allergies: Negative.   Psychiatric/Behavioral: The patient is not nervous/anxious.   All other systems reviewed and are negative. 14 point review of systems was performed and is negative except as detailed under history of present illness and above  PHYSICAL EXAMINATION: ECOG PERFORMANCE STATUS: 1 - Symptomatic but completely ambulatory  Vitals:   01/28/17 1000  BP: (!) 127/54  Pulse: 92  Resp: 16  Temp: 97.9 F (36.6 C)   Filed Weights   01/28/17 1000  Weight: 247 lb 3.2 oz (112.1 kg)   .Body mass index is 40.51 kg/m.   Physical Exam    Constitutional: She is oriented to person, place, and time and well-developed, well-nourished, and in no distress.  HENT:  Head: Normocephalic and atraumatic.  Eyes: EOM are normal. Pupils are equal, round, and reactive to light.  Neck: Normal range of motion. Neck supple.  Cardiovascular: Normal rate, regular rhythm and normal heart sounds.   Pulmonary/Chest: Effort normal and breath sounds normal.  Abdominal: Soft. Bowel sounds are normal.  Musculoskeletal: Normal range of motion.  Neurological: She is alert and oriented to person, place, and time. Gait normal.  Skin: Skin is warm and dry.  Nursing note and vitals reviewed.  LABORATORY DATA:  I have reviewed the data as listed  CBC Latest Ref Rng & Units 01/28/2017 12/31/2016 11/07/2016  WBC 4.0 - 10.5 K/uL 9.9 9.4 11.2(H)  Hemoglobin 12.0 - 15.0 g/dL 10.5(L) 11.1(L) 11.3(L)  Hematocrit 36.0 - 46.0 % 31.3(L) 34.3(L) 35.2(L)  Platelets 150 - 400 K/uL 129(L) 292 268   CMP Latest Ref Rng & Units 01/28/2017 12/31/2016 11/07/2016  Glucose 65 - 99 mg/dL 218(H) 96 160(H)  BUN 6 - 20 mg/dL 21(H) 21(H) 18  Creatinine 0.44 - 1.00 mg/dL 1.01(H) 0.95 0.92  Sodium 135 - 145 mmol/L 138 139 137  Potassium 3.5 - 5.1 mmol/L 3.7 4.1 3.8  Chloride 101 - 111 mmol/L 98(L) 100(L) 98(L)  CO2 22 - 32 mmol/L 28 32 31  Calcium 8.9 - 10.3 mg/dL 8.6(L) 9.1 9.5  Total Protein 6.5 - 8.1 g/dL 6.1(L) 6.8 7.1  Total Bilirubin 0.3 - 1.2 mg/dL 1.1 0.9 0.6  Alkaline Phos 38 - 126 U/L 49 47 50  AST 15 - 41 U/L 27 24 28   ALT 14 - 54 U/L 27 29 29    PATHOLOGY:      Colonoscopy 09/23/2016 - One 25 mm, recently bleeding polyp in the proximal ascending colon, NOT REMOVED. - Heme positive stools/hematichezia due to malignant tumor at the hepatic flexure AND COLON POLYPS. Biopsied. - Diverticulosis in the sigmoid colon and in the descending colon.  RADIOGRAPHIC STUDIES: I  have personally reviewed the radiological images as listed and agreed with the findings in the  report.  Initial PET scan 11/17/2016 IMPRESSION: The hepatic lesion seen on the CT scan and MRI does not demonstrate hypermetabolism and is likely a benign entity.  Right maxillary sinus disease with associated hypermetabolism.  Scattered pulmonary nodules, likely benign.  MRI Liver 10/28/2016 IMPRESSION: 1. Indeterminate lesion within the central RIGHT hepatic lobe demonstrates early enhancement and is therefore concerning. Differential would include atypical focal fatty infiltration, atypical focal nodular hyperplasia, and colorectal carcinoma metastasis. Low CEA and negative lymph nodes would weight against colorectal carcinoma metastasis. Pericolonic fat invasion is an aggressive feature. Lesion warrants further evaluation with FDG PET scan or follow-up MRI or CT in 1 to 3 months. 2. No additional evidence of metastatic disease.  ASSESSMENT & PLAN:  Cancer Staging Colon cancer [facility]) Staging form: Colon and Rectum, AJCC 8th Edition - Clinical: Stage IIA (cT3, cN0, cM0) - Signed by [name], MD on 11/26/2016   71 year old very pleasant lady with  1) Stage IIA colon adenocarcinoma.  She has stage II high risk disease in the presence of lymphovascular invasion and high grade (poorly differentiated) tumor. Patient has opted for adjuvant treatment with Xeloda 1000 mg/m2 PO qBID 1-14 every 3 weeks x 24 weeks. She started taking her Xeloda on 12/25/16. -Tolerating xeloda well. Continue treatment as planned.  2) Normocytic Anemia This appears to be a combination of iron deficiency from GI blood loss. Anemia could also partly be weak from her methotrexate use for rheumatoid arthritis. Patient was transfused on 2/27 with improvement in her hemoglobin. Hemoglobin has been trending up on its own; last lab draw on 11/07/16 demonstrated hemoglobin is up to 11.3 g/dL. Continue to observe CBC, she will get labs today.   LABS:  CBC, CMP, CEA today and on next visit. DISPO: RTC in 4 weeks  for follow up.   All of the patients questions were answered with apparent satisfaction. The patient knows to call the clinic with any problems, questions or concerns.  This document serves as a record of services personally performed by [name], MD. It was created on her behalf by Martinique Casey, a trained medical scribe. The creation of this record is based on the scribe's personal observations and the provider's statements to them. This document has been checked and approved by the attending provider.  I have reviewed the above documentation for accuracy and completeness and I agree with the above.  This note was electronically signed.   01/28/2017 10:36 AM

## 2017-01-28 NOTE — Patient Instructions (Signed)
[place] at [facility] Discharge Instructions  RECOMMENDATIONS MADE BY THE CONSULTANT AND ANY TEST RESULTS WILL BE SENT TO YOUR REFERRING PHYSICIAN.  You were seen today by Dr. Oliva Bustard. Return in 1 week for labs and follow up.   Thank you for choosing [place] at [facility] to provide your oncology and hematology care.  To afford each patient quality time with our provider, please arrive at least 15 minutes before your scheduled appointment time.    If you have a lab appointment with the [facility] please come in thru the  Main Entrance and check in at the main information desk  You need to re-schedule your appointment should you arrive 10 or more minutes late.  We strive to give you quality time with our providers, and arriving late affects you and other patients whose appointments are after yours.  Also, if you no show three or more times for appointments you may be dismissed from the clinic at the providers discretion.     Again, thank you for choosing [facility].  Our hope is that these requests will decrease the amount of time that you wait before being seen by our physicians.       _____________________________________________________________  Should you have questions after your visit to [facility], please contact our office at [phone] between the hours of 8:30 a.m. and 4:30 p.m.  Voicemails left after 4:30 p.m. will not be returned until the following business day.  For prescription refill requests, have your pharmacy contact our office.       Resources For Cancer Patients and their Caregivers ? American Cancer Society: Can assist with transportation, wigs, general needs, runs Look Good Feel Better.        [phone] ? Cancer Care: Provides financial assistance, online support groups, medication/co-pay assistance.  1-800-813-HOPE 418-107-6464) ? [facility] Assists  [place] cancer patients and their families through emotional , educational and financial support.  5804256355 ? [place] DSS Where to apply for food stamps, Medicaid and utility assistance. 413-158-9872 ? [facility]: Transportation to medical appointments. 838-653-6849 ? Social Security Administration: May apply for disability if have a Stage IV cancer. 812-878-6972 505 299 0714 ? [employer], Disability and Transit Services: Assists with nutrition, care and transit needs. [place] Support Programs: @10RELATIVEDAYS @ > Cancer Support Group  2nd Tuesday of the month 1pm-2pm, Journey Room  > Creative Journey  3rd Tuesday of the month 1130am-1pm, Journey Room  > Look Good Feel Better  1st Wednesday of the month 10am-12 noon, Journey Room (Call [facility] to register [phone])

## 2017-01-28 NOTE — Progress Notes (Signed)
Consent signed.  Extensive teaching packet given.

## 2017-01-29 LAB — CEA: CEA: 4.4 ng/mL (ref 0.0–4.7)

## 2017-02-04 ENCOUNTER — Other Ambulatory Visit (HOSPITAL_COMMUNITY): Payer: Medicare Other

## 2017-02-04 ENCOUNTER — Ambulatory Visit (HOSPITAL_COMMUNITY): Payer: Medicare Other

## 2017-02-10 ENCOUNTER — Telehealth (HOSPITAL_COMMUNITY): Payer: Self-pay

## 2017-02-10 NOTE — Telephone Encounter (Signed)
Patients daughter left message stating that her mom was having an increase in "dizzy spells". Reviewed with RN and reviewed chart. Patient is currently not supposed to be taking Xeloda. Left message for daughter to call us back or take patient to her PCP.

## 2017-02-11 ENCOUNTER — Telehealth: Payer: Self-pay | Admitting: Gastroenterology

## 2017-02-11 NOTE — Telephone Encounter (Signed)
Recall for repeat ct chest

## 2017-02-11 NOTE — Telephone Encounter (Signed)
Letter mailed to pt.  

## 2017-02-19 ENCOUNTER — Encounter (HOSPITAL_COMMUNITY): Payer: Self-pay

## 2017-02-19 ENCOUNTER — Encounter (HOSPITAL_COMMUNITY): Payer: Medicare Other | Attending: Oncology

## 2017-02-19 ENCOUNTER — Encounter (HOSPITAL_BASED_OUTPATIENT_CLINIC_OR_DEPARTMENT_OTHER): Payer: Medicare Other | Admitting: Oncology

## 2017-02-19 ENCOUNTER — Telehealth: Payer: Self-pay | Admitting: Gastroenterology

## 2017-02-19 VITALS — BP 134/66 | HR 103 | Temp 98.2°F | Resp 18 | Wt 249.5 lb

## 2017-02-19 DIAGNOSIS — C183 Malignant neoplasm of hepatic flexure: Secondary | ICD-10-CM

## 2017-02-19 DIAGNOSIS — R5383 Other fatigue: Secondary | ICD-10-CM

## 2017-02-19 DIAGNOSIS — K769 Liver disease, unspecified: Secondary | ICD-10-CM | POA: Diagnosis not present

## 2017-02-19 DIAGNOSIS — C189 Malignant neoplasm of colon, unspecified: Secondary | ICD-10-CM | POA: Insufficient documentation

## 2017-02-19 DIAGNOSIS — R160 Hepatomegaly, not elsewhere classified: Secondary | ICD-10-CM

## 2017-02-19 DIAGNOSIS — D649 Anemia, unspecified: Secondary | ICD-10-CM

## 2017-02-19 DIAGNOSIS — R42 Dizziness and giddiness: Secondary | ICD-10-CM | POA: Diagnosis not present

## 2017-02-19 LAB — CBC WITH DIFFERENTIAL/PLATELET
Basophils Absolute: 0 10*3/uL (ref 0.0–0.1)
Basophils Relative: 0 %
Eosinophils Absolute: 0.2 10*3/uL (ref 0.0–0.7)
Eosinophils Relative: 2 %
HCT: 34.3 % — ABNORMAL LOW (ref 36.0–46.0)
Hemoglobin: 11.1 g/dL — ABNORMAL LOW (ref 12.0–15.0)
Lymphocytes Relative: 24 %
Lymphs Abs: 2.8 10*3/uL (ref 0.7–4.0)
MCH: 29.3 pg (ref 26.0–34.0)
MCHC: 32.4 g/dL (ref 30.0–36.0)
MCV: 90.5 fL (ref 78.0–100.0)
Monocytes Absolute: 0.8 10*3/uL (ref 0.1–1.0)
Monocytes Relative: 7 %
Neutro Abs: 8 10*3/uL — ABNORMAL HIGH (ref 1.7–7.7)
Neutrophils Relative %: 67 %
Platelets: 191 10*3/uL (ref 150–400)
RBC: 3.79 MIL/uL — ABNORMAL LOW (ref 3.87–5.11)
RDW: 21.3 % — ABNORMAL HIGH (ref 11.5–15.5)
WBC: 11.8 10*3/uL — ABNORMAL HIGH (ref 4.0–10.5)

## 2017-02-19 LAB — COMPREHENSIVE METABOLIC PANEL
ALT: 25 U/L (ref 14–54)
AST: 28 U/L (ref 15–41)
Albumin: 3.2 g/dL — ABNORMAL LOW (ref 3.5–5.0)
Alkaline Phosphatase: 59 U/L (ref 38–126)
Anion gap: 10 (ref 5–15)
BUN: 15 mg/dL (ref 6–20)
CO2: 27 mmol/L (ref 22–32)
Calcium: 8.9 mg/dL (ref 8.9–10.3)
Chloride: 100 mmol/L — ABNORMAL LOW (ref 101–111)
Creatinine, Ser: 1.16 mg/dL — ABNORMAL HIGH (ref 0.44–1.00)
GFR calc Af Amer: 53 mL/min — ABNORMAL LOW (ref >60)
GFR calc non Af Amer: 46 mL/min — ABNORMAL LOW (ref >60)
Glucose, Bld: 291 mg/dL — ABNORMAL HIGH (ref 65–99)
Potassium: 4.1 mmol/L (ref 3.5–5.1)
Sodium: 137 mmol/L (ref 135–145)
Total Bilirubin: 0.6 mg/dL (ref 0.3–1.2)
Total Protein: 6.7 g/dL (ref 6.5–8.1)

## 2017-02-19 LAB — IRON AND TIBC
Iron: 26 ug/dL — ABNORMAL LOW (ref 28–170)
Saturation Ratios: 6 % — ABNORMAL LOW (ref 10.4–31.8)
TIBC: 407 ug/dL (ref 250–450)
UIBC: 381 ug/dL

## 2017-02-19 LAB — FERRITIN: Ferritin: 32 ng/mL (ref 11–307)

## 2017-02-19 MED ORDER — MECLIZINE HCL 32 MG PO TABS: 32.0000 mg | ORAL_TABLET | Freq: Three times a day (TID) | ORAL | 0 refills | Status: DC | PRN

## 2017-02-19 NOTE — Telephone Encounter (Signed)
Tried to call pt's daughter, LMOVM. Informed her that I would let [facility] know that pt is already scheduled for CT Chest 05/22/17 by the cancer center. Also informed her that pt is due for Colonoscopy in 1 year from last Colonoscopy so she will be due Feb 2019.

## 2017-02-19 NOTE — Progress Notes (Signed)
HEMATOLOGY/ONCOLOGY PROGRESS NOTE  Date of Service: 02/19/2017  Patient Care Team: Earney Mallet, MD as PCP - General (Family Medicine) Danie Binder, MD as Consulting Physician (Gastroenterology) Dr Aviva Signs MD (general surgery)   CHIEF COMPLAINTS:  T3N0 colon cancer   ONCOLOGIC HISTORY:   Colon cancer ([place])   10/10/2016 Initial Diagnosis    Colon cancer ([place])     10/10/2016 Surgery    Partial colectomy by Dr. Arnoldo Morale  Pathology shows  a 2.1 cm grade 2 adenocarcinoma of the ascending colon with invasion through the muscularis propria into the peri-colorectal tissues.         11/17/2016 PET scan    The hepatic lesion seen on the CT scan and MRI does not demonstrate hypermetabolism and is likely a benign entity.  Right maxillary sinus disease with associated hypermetabolism.  Scattered pulmonary nodules, likely benign. No follow-up needed if patient is low-risk (and has no known or suspected primary neoplasm). Non-contrast chest CT can be considered in 12 months if patient is high-risk.       12/25/2016 -  Adjuvant Chemotherapy    Xeloda 2000mg  PO BID take for 14 days out of 21 days. Plan for total of 24 weeks of treatment.       HISTORY OF PRESENTING ILLNESS:  Wanda Curry is a 71 y.o. female who returns for follow up of colon cancer. Pathology results of the colon flexure mass were consistent with adenocarcinoma. CT of the abdomen and pelvis on 10/08/2016 showed a 3.2 cm indeterminate low attenuation mass in the central liver. Patient was seen by Dr. Arnoldo Morale and had a partial colectomy on 10/10/2016. Pathology shows  a 2.1 cm grade 2 adenocarcinoma of the ascending colon with invasion through the muscularis propria into the peri-colorectal tissues.   Patient presented with her daughter today. Patient has been off for adjuvant Xeloda for 4 weeks now. She states that she still fatigued but it is improving. Patient is no longer spending 90% of her time in bed. She  stated that her blistering skin changes have improved as well. She states in the last few days she has noted worsening shortness of breath. She also has been having intermittent dizziness, which has improved in the past week. Patient was previously diagnosed with a cardiac condition, however she cannot member to name, and was told that it could potentially cause dizziness. She has not yet seen a cardiologist.    MEDICAL HISTORY:  Past Medical History:  Diagnosis Date  . Anemia   . Arthritis    RA  . Cancer ([place])    skin cancer  . Depression   . Diabetes mellitus without complication ([place])   . Fibromyalgia   . GERD (gastroesophageal reflux disease)   . Heart murmur    ECHO scheduled 05-25-2015  . Hyperlipidemia   . Neuropathy   . PONV (postoperative nausea and vomiting)     SURGICAL HISTORY: Past Surgical History:  Procedure Laterality Date  . ABDOMINAL HYSTERECTOMY    . BIOPSY  09/23/2016   Procedure: BIOPSY;  Surgeon: Danie Binder, MD;  [facility];  Service: Endoscopy;;  hepatic flexure mass  . CHOLECYSTECTOMY    . COLONOSCOPY WITH PROPOFOL N/A 09/23/2016   Procedure: COLONOSCOPY WITH PROPOFOL;  Surgeon: Danie Binder, MD;  [facility];  Service: Endoscopy;  Laterality: N/A;  7:30 am  . HARDWARE REMOVAL Right 05/30/2015   Procedure: HARDWARE REMOVAL;  Surgeon: Ninetta Lights, MD;  Location: [place];  Service: Orthopedics;  Laterality: Right;  . PARTIAL COLECTOMY N/A 10/10/2016   Procedure: PARTIAL COLECTOMY;  Surgeon: Aviva Signs, MD;  Location: [facility];  Service: General;  Laterality: N/A;  . planter warts Bilateral    both feet  . POLYPECTOMY  09/23/2016   Procedure: POLYPECTOMY;  Surgeon: Danie Binder, MD;  [facility];  Service: Endoscopy;;  transverse colon polyps times 2, rectal polyp  . skin grafts     due to planter warts  . TOTAL KNEE ARTHROPLASTY Right 05/30/2015   Procedure: RIGHT TOTAL KNEE ARTHROPLASTY;  Surgeon: Ninetta Lights, MD;  Location: [place];  Service: Orthopedics;  Laterality: Right;    SOCIAL HISTORY: Social History   Social History  . Marital status: Widowed    Spouse name: N/A  . Number of children: N/A  . Years of education: N/A   Occupational History  . Not on file.   Social History Main Topics  . Smoking status: Former Smoker    Packs/day: 1.00    Years: 40.00    Types: Cigarettes    Quit date: 08/18/2004  . Smokeless tobacco: Never Used  . Alcohol use No  . Drug use: No  . Sexual activity: Not Currently    Birth control/ protection: Surgical   Other Topics Concern  . Not on file   Social History Narrative  . No narrative on file    FAMILY HISTORY: Family History  Problem Relation Age of Onset  . Colon cancer Neg Hx     ALLERGIES:  is allergic to adhesive [tape]; sulfa antibiotics; and erythromycin.  MEDICATIONS:  Current Outpatient Prescriptions  Medication Sig Dispense Refill  . acetaminophen ([employer] PAIN RELIEF EXTRA STRENGTH) 500 MG tablet Take 500 mg by mouth every 6 (six) hours as needed (for pain.).    Marland Kitchen atorvastatin (LIPITOR) 20 MG tablet Take 20 mg by mouth daily.    . Calcium Carb-Cholecalciferol (CALCIUM + D3) 600-200 MG-UNIT TABS Take 1 tablet by mouth 2 (two) times daily.  0  . carvedilol (COREG) 6.25 MG tablet Take 6.25 mg by mouth 2 (two) times daily with a meal.    . folic acid (FOLVITE) 1 MG tablet Take 1 mg by mouth daily.    Marland Kitchen gabapentin (NEURONTIN) 600 MG tablet Take 600 mg by mouth 3 (three) times daily.    Marland Kitchen GLUCOSAMINE-CHONDROITIN PO Take 1 tablet by mouth 2 (two) times daily.    . hydrochlorothiazide (HYDRODIURIL) 25 MG tablet Take 25 mg by mouth daily.    . Insulin Glargine (BASAGLAR KWIKPEN [place]) Inject 42 Units into the skin every morning.    . magnesium oxide (MAG-OX) 400 (241.3 Mg) MG tablet Take 1 tablet (400 mg total) by mouth daily. 60 tablet 0  . methotrexate (RHEUMATREX) 2.5 MG tablet Take 20 mg by mouth every Sunday.  Caution:Chemotherapy. Protect from light.     . Milnacipran HCl (SAVELLA) 100 MG TABS tablet Take 100 mg by mouth 2 (two) times daily.    . Multiple Vitamins-Minerals (CENTRUM SILVER PO) Take 1 tablet by mouth daily.    Marland Kitchen NOVOLOG MIX 70/30 FLEXPEN (70-30) 100 UNIT/ML FlexPen INJECT 30 UNITS SUBCUTANEOUSLY TWICE DAILY AT BREAKFAST AND DINNER  3  . omeprazole (PRILOSEC) 40 MG capsule TAKE ONE CAPSULE BY MOUTH ONCE A DAY  3  . ondansetron (ZOFRAN) 8 MG tablet Take 1 tablet (8 mg total) by mouth every 8 (eight) hours as needed for nausea or vomiting. 80 tablet 1  . rOPINIRole (REQUIP)  1 MG tablet Take 1 mg by mouth 3 (three) times daily.    Marland Kitchen aspirin EC 325 MG tablet Take 325 mg by mouth daily.    . capecitabine (XELODA) 500 MG tablet Take 4 tablets (2,000 mg total) by mouth 2 (two) times daily after a meal. Take on days 1-14. Repeat every 21 days. (Patient not taking: Reported on 02/19/2017) 112 tablet 5  . meclizine (ANTIVERT) 32 MG tablet Take 1 tablet (32 mg total) by mouth 3 (three) times daily as needed for dizziness. 30 tablet 0   No current facility-administered medications for this visit.    Review of Systems  Constitutional: Positive for malaise/fatigue.       No loss of appetite   HENT: Negative.   Eyes: Negative.   Respiratory: Positive for shortness of breath.   Cardiovascular: Positive for leg swelling. Negative for chest pain and palpitations.  Gastrointestinal: Negative.  Negative for abdominal pain, blood in stool, diarrhea and melena.  Genitourinary: Negative.   Musculoskeletal: Negative for joint pain (RA) and myalgias (fibromyalgia).       Cramping in her toes and ankles at night  Skin: Negative.   Neurological: Positive for dizziness.  Endo/Heme/Allergies: Negative.   Psychiatric/Behavioral: Negative.  The patient is not nervous/anxious.   All other systems reviewed and are negative. 14 point review of systems was performed and is negative except as detailed under history  of present illness and above  PHYSICAL EXAMINATION: ECOG PERFORMANCE STATUS: 1 - Symptomatic but completely ambulatory  Vitals:   02/19/17 1346  BP: 134/66  Pulse: (!) 103  Resp: 18  Temp: 98.2 F (36.8 C)   Filed Weights   02/19/17 1346  Weight: 249 lb 8 oz (113.2 kg)   .Body mass index is 40.89 kg/m.   Physical Exam  Constitutional: She is oriented to person, place, and time and well-developed, well-nourished, and in no distress.  HENT:  Head: Normocephalic and atraumatic.  Eyes: EOM are normal. Pupils are equal, round, and reactive to light.  Neck: Normal range of motion. Neck supple.  Cardiovascular: Normal rate, regular rhythm and normal heart sounds.   Pulmonary/Chest: Effort normal and breath sounds normal.  Abdominal: Soft. Bowel sounds are normal.  Musculoskeletal: Normal range of motion. She exhibits edema.  Neurological: She is alert and oriented to person, place, and time. Gait normal.  Skin: Skin is warm and dry. No rash noted. No erythema.  Psychiatric: Mood, memory, affect and judgment normal.  Nursing note and vitals reviewed.  LABORATORY DATA:  I have reviewed the data as listed  CBC Latest Ref Rng & Units 02/19/2017 01/28/2017 12/31/2016  WBC 4.0 - 10.5 K/uL 11.8(H) 9.9 9.4  Hemoglobin 12.0 - 15.0 g/dL 11.1(L) 10.5(L) 11.1(L)  Hematocrit 36.0 - 46.0 % 34.3(L) 31.3(L) 34.3(L)  Platelets 150 - 400 K/uL 191 129(L) 292   CMP Latest Ref Rng & Units 02/19/2017 01/28/2017 12/31/2016  Glucose 65 - 99 mg/dL 291(H) 218(H) 96  BUN 6 - 20 mg/dL 15 21(H) 21(H)  Creatinine 0.44 - 1.00 mg/dL 1.16(H) 1.01(H) 0.95  Sodium 135 - 145 mmol/L 137 138 139  Potassium 3.5 - 5.1 mmol/L 4.1 3.7 4.1  Chloride 101 - 111 mmol/L 100(L) 98(L) 100(L)  CO2 22 - 32 mmol/L 27 28 32  Calcium 8.9 - 10.3 mg/dL 8.9 8.6(L) 9.1  Total Protein 6.5 - 8.1 g/dL 6.7 6.1(L) 6.8  Total Bilirubin 0.3 - 1.2 mg/dL 0.6 1.1 0.9  Alkaline Phos 38 - 126 U/L 59 49 47  AST 15 - 41 U/L 28 27 24   ALT 14 - 54  U/L 25 27 29    PATHOLOGY:      Colonoscopy 09/23/2016 - One 25 mm, recently bleeding polyp in the proximal ascending colon, NOT REMOVED. - Heme positive stools/hematichezia due to malignant tumor at the hepatic flexure AND COLON POLYPS. Biopsied. - Diverticulosis in the sigmoid colon and in the descending colon.  RADIOGRAPHIC STUDIES: I have personally reviewed the radiological images as listed and agreed with the findings in the report.  Initial PET scan 11/17/2016 IMPRESSION: The hepatic lesion seen on the CT scan and MRI does not demonstrate hypermetabolism and is likely a benign entity.  Right maxillary sinus disease with associated hypermetabolism.  Scattered pulmonary nodules, likely benign.  MRI Liver 10/28/2016 IMPRESSION: 1. Indeterminate lesion within the central RIGHT hepatic lobe demonstrates early enhancement and is therefore concerning. Differential would include atypical focal fatty infiltration, atypical focal nodular hyperplasia, and colorectal carcinoma metastasis. Low CEA and negative lymph nodes would weight against colorectal carcinoma metastasis. Pericolonic fat invasion is an aggressive feature. Lesion warrants further evaluation with FDG PET scan or follow-up MRI or CT in 1 to 3 months. 2. No additional evidence of metastatic disease.  ASSESSMENT & PLAN:  Cancer Staging Colon cancer [facility]) Staging form: Colon and Rectum, AJCC 8th Edition - Clinical: Stage IIA (cT3, cN0, cM0) - Signed by [name], MD on 11/26/2016   71 year old very pleasant lady with  1) Stage IIA colon adenocarcinoma.  She has stage II high risk disease in the presence of lymphovascular invasion and high grade (poorly differentiated) tumor. Patient has opted for adjuvant treatment with Xeloda 1000 mg/m2 PO qBID 1-14 every 3 weeks x 24 weeks. She started taking her Xeloda on 12/25/16. Patient took xeloda up to cycle 2 day 13, however discontinued the chemo due to intolerable side  effects including debilitating fatigue, hand-foot syndrome, mucositis, and diarrhea.  Patient states today that she no longer wants to take adjuvant xeloda due to the terrible side effects she experienced with cycle 2 of xeloda.  At this time we will plan to continue active surveillance per guidelines as stated below. Repeat CT C/A/P with contrast and labs prior to next visit. She will need a repeat colonoscopy in September 2018 at the 6 month mark.  NCCN guidelines for surveillance for Colon cancer are as follows (1.2017): A. Stage I 1. Colonoscopy at year 1 A. If advanced adenoma, repeat in 1 year B. If no advanced adenoma, repeat in 3 years, and then every 5 years.  B. Stage II, Stage III 1. H+P every 3-6 months x 2 years and then every 6 months for a total of 5 years  2. CEA every 3-6 months x 2 years and then every 6 months for a total of 5 years  3. CT CAP every 6-12 months (category 2B for frequency < 12 months) for a total of 5 years . 4.  Colonoscopy in 1 year except if no preoperative colonoscopy due to obstructing lesion, colonoscopy in 3-6 months.  A. If advanced adenoma, repeat in 1 year B. If no advanced adenoma, repeat in 3 years, then every 5 years 5. PET/CT scan is not recommended.   C. Stage IV 1. H+P every 3-6 months x 2 years and then every 6 months for a total of 5 years  2. CEA every 3 months x 2 years and then every 6 months for a total of 3- 5 years  3. CT CAP every  3-6 months (category 2B for frequency < 6 months) x 2 years., then every 6-12 months for a total of 5 years . 4. Colonoscopy in 1 year except if no preoperative colonoscopy due to obstructing lesion, colonoscopy in 3-6 months.  A. If advanced adenoma, repeat in 1 year B. If no advanced adenoma, repeat in 3 years, then every 5 years   2)Dizziness I have prescribed meclizine TID PRN for the patient. I have recommended for her to see her cardiologist to make sure her dizziness, worsening LE edema, and  new onset SOB are not cardiac related.   LABS:  CBC, CMP, CEA prior to next visit. DISPO: RTC in 3 months for follow up and to review scans.   All of the patients questions were answered with apparent satisfaction. The patient knows to call the clinic with any problems, questions or concerns.    This note was electronically signed.  [name], MD 02/19/2017 2:11 PM

## 2017-02-19 NOTE — Telephone Encounter (Signed)
Pt's daughter, [name], called to say the patient received a letter from [facility] that it was time to schedule a chest CT and pt already has one scheduled in October by the cancer center. Daughter also said that patient was due a colonoscopy in Sept?  I checked the patient's chart and it was recommended to repeat in one year which would be in Feb 2019. Please advise and call [name] back at [phone]

## 2017-02-20 LAB — CANCER ANTIGEN 19-9: CA 19-9: 42 U/mL — ABNORMAL HIGH (ref 0–35)

## 2017-02-20 NOTE — Telephone Encounter (Signed)
PLEASE CALL PT'S DAUGHTER. WHEN SHE HAD THE CT SCAN IN FEB 2018 SHE HAD A SMALL LUNG NODULE IN THE RIGHT MIDDLE LOBE. THE RADIOLOGIST RECOMMENDED REPEAT CT SCAN IN AUG 2018. PT NEEDS A REPEAT TCS IN FEB 2019.

## 2017-02-23 ENCOUNTER — Other Ambulatory Visit: Payer: Self-pay

## 2017-02-23 DIAGNOSIS — R911 Solitary pulmonary nodule: Secondary | ICD-10-CM

## 2017-02-23 NOTE — Telephone Encounter (Signed)
PT's daughter, Edgar Frisk called and is aware. OK to schedule the CT for August, just NOT August 20th. Forwarding to [place] to nic the colonoscopy for Feb 2019.

## 2017-02-24 NOTE — Telephone Encounter (Signed)
Called [employer] for PA for CT chest. I was transferred to [place] [phone]) to complete PA. No PA needed. Ref# [id number].

## 2017-02-24 NOTE — Telephone Encounter (Signed)
CT chest scheduled for 03/25/17 at 11:00am, pt to arrive at 10:45am. NPO 4 hours prior to test. Called and informed pt's daughter [name]) of appt. Advised pt to check with the cancer center since they had pt scheduled for CT chest 05/22/17.

## 2017-02-24 NOTE — Telephone Encounter (Signed)
Already on the recall

## 2017-03-25 ENCOUNTER — Other Ambulatory Visit (HOSPITAL_COMMUNITY): Payer: Self-pay | Admitting: Oncology

## 2017-03-25 ENCOUNTER — Ambulatory Visit (HOSPITAL_COMMUNITY)
Admission: RE | Admit: 2017-03-25 | Discharge: 2017-03-25 | Disposition: A | Discharge status: Home / Self Care | Payer: Medicare Other | Source: Ambulatory Visit | Attending: Gastroenterology | Admitting: Gastroenterology

## 2017-03-25 ENCOUNTER — Telehealth: Payer: Self-pay | Admitting: Gastroenterology

## 2017-03-25 ENCOUNTER — Telehealth (HOSPITAL_COMMUNITY): Payer: Self-pay | Admitting: Oncology

## 2017-03-25 DIAGNOSIS — R918 Other nonspecific abnormal finding of lung field: Secondary | ICD-10-CM | POA: Diagnosis not present

## 2017-03-25 DIAGNOSIS — R911 Solitary pulmonary nodule: Secondary | ICD-10-CM | POA: Diagnosis present

## 2017-03-25 DIAGNOSIS — E041 Nontoxic single thyroid nodule: Secondary | ICD-10-CM | POA: Diagnosis not present

## 2017-03-25 DIAGNOSIS — I2510 Atherosclerotic heart disease of native coronary artery without angina pectoris: Secondary | ICD-10-CM | POA: Diagnosis not present

## 2017-03-25 DIAGNOSIS — I2699 Other pulmonary embolism without acute cor pulmonale: Secondary | ICD-10-CM | POA: Insufficient documentation

## 2017-03-25 DIAGNOSIS — I700 Atherosclerosis of aorta: Secondary | ICD-10-CM | POA: Diagnosis not present

## 2017-03-25 MED ORDER — RIVAROXABAN 15 MG PO TABS: 15.0000 mg | ORAL_TABLET | Freq: Two times a day (BID) | ORAL | 0 refills | Status: DC

## 2017-03-25 MED ORDER — IOPAMIDOL (ISOVUE-300) INJECTION 61%
75.0000 mL | Freq: Once | INTRAVENOUS | Status: AC | PRN
  Administered 2017-03-25: 75 mL via INTRAVENOUS

## 2017-03-25 NOTE — Telephone Encounter (Signed)
LUNG NODULE CHANGED 1 MM. NEED REPEAT CT CHEST W/ IV CONTRAST IN 6 MOS.

## 2017-03-25 NOTE — Telephone Encounter (Signed)
Received a call from Dr. Oneida Alar who stated that she was called by radiology who said patient has a new PE on her CT chest that she had performed today. I have sent in an Rx to her pharmacy today with xarelto 15 mg PO BID x 21 days to be followed by 20mg  PO daily thereafter. I have scheduled her follow up to come see [place] in 2 days on 03/27/17 to discuss the PE and perform a hypercoagulable workup. It is likely provoked by her underlying malignancy. We will call the patient with the appointment time.

## 2017-03-25 NOTE — Telephone Encounter (Signed)
PT HAVING SOB FOR 2 WEEKS. CALLED CARDIOLOGY. INCREASED LASIX. CT CHEST TODAY TO FOLLOW UP LUNG NODULE. PT HAS PE. SPOKE WITH DR. Talbert Cage. WILL SEND RX FOR XARELTO 15 MG BID FOR 3 WEEKS AND THEN 20 MG DAILY. SHE CAN SEE CARDIOLOGY THUR AND WILL HAVE FOLLOW UP WITH DR. Etta Quill. BLEEDING PRECAUTIONS DISCUSSED.

## 2017-03-25 NOTE — Telephone Encounter (Addendum)
CALLED DR. ZAGOL. NOT IN [facility] TODAY. SPOKE WITH [facility]. AWARE PT HAS PE. DISCUSSED PLAN.

## 2017-03-25 NOTE — Telephone Encounter (Signed)
Stacey, please nic for CT Chest w/IV contrast in 6 months.

## 2017-03-27 ENCOUNTER — Encounter (HOSPITAL_COMMUNITY): Payer: Self-pay | Admitting: Oncology

## 2017-03-27 ENCOUNTER — Encounter (HOSPITAL_COMMUNITY): Payer: Medicare Other | Attending: Hematology | Admitting: Oncology

## 2017-03-27 VITALS — BP 144/55 | HR 93 | Temp 98.4°F | Resp 22 | Wt 249.0 lb

## 2017-03-27 DIAGNOSIS — Z85828 Personal history of other malignant neoplasm of skin: Secondary | ICD-10-CM | POA: Insufficient documentation

## 2017-03-27 DIAGNOSIS — C189 Malignant neoplasm of colon, unspecified: Secondary | ICD-10-CM

## 2017-03-27 DIAGNOSIS — M069 Rheumatoid arthritis, unspecified: Secondary | ICD-10-CM | POA: Insufficient documentation

## 2017-03-27 DIAGNOSIS — Z794 Long term (current) use of insulin: Secondary | ICD-10-CM | POA: Insufficient documentation

## 2017-03-27 DIAGNOSIS — K219 Gastro-esophageal reflux disease without esophagitis: Secondary | ICD-10-CM | POA: Insufficient documentation

## 2017-03-27 DIAGNOSIS — Z9189 Other specified personal risk factors, not elsewhere classified: Secondary | ICD-10-CM

## 2017-03-27 DIAGNOSIS — C183 Malignant neoplasm of hepatic flexure: Secondary | ICD-10-CM | POA: Diagnosis not present

## 2017-03-27 DIAGNOSIS — Z9049 Acquired absence of other specified parts of digestive tract: Secondary | ICD-10-CM | POA: Insufficient documentation

## 2017-03-27 DIAGNOSIS — E785 Hyperlipidemia, unspecified: Secondary | ICD-10-CM | POA: Insufficient documentation

## 2017-03-27 DIAGNOSIS — I2699 Other pulmonary embolism without acute cor pulmonale: Secondary | ICD-10-CM

## 2017-03-27 DIAGNOSIS — D649 Anemia, unspecified: Secondary | ICD-10-CM

## 2017-03-27 DIAGNOSIS — F329 Major depressive disorder, single episode, unspecified: Secondary | ICD-10-CM | POA: Insufficient documentation

## 2017-03-27 DIAGNOSIS — K769 Liver disease, unspecified: Secondary | ICD-10-CM | POA: Insufficient documentation

## 2017-03-27 DIAGNOSIS — Z7982 Long term (current) use of aspirin: Secondary | ICD-10-CM | POA: Insufficient documentation

## 2017-03-27 DIAGNOSIS — K5730 Diverticulosis of large intestine without perforation or abscess without bleeding: Secondary | ICD-10-CM | POA: Insufficient documentation

## 2017-03-27 DIAGNOSIS — Z9889 Other specified postprocedural states: Secondary | ICD-10-CM | POA: Insufficient documentation

## 2017-03-27 DIAGNOSIS — E1140 Type 2 diabetes mellitus with diabetic neuropathy, unspecified: Secondary | ICD-10-CM | POA: Insufficient documentation

## 2017-03-27 DIAGNOSIS — Z87891 Personal history of nicotine dependence: Secondary | ICD-10-CM | POA: Insufficient documentation

## 2017-03-27 DIAGNOSIS — C182 Malignant neoplasm of ascending colon: Secondary | ICD-10-CM | POA: Insufficient documentation

## 2017-03-27 DIAGNOSIS — M797 Fibromyalgia: Secondary | ICD-10-CM | POA: Insufficient documentation

## 2017-03-27 NOTE — Progress Notes (Signed)
Received a call from Dr. Oneida Alar who stated that she was called by radiology who said patient has a new PE on her CT chest that she had performed today. I have sent in an Rx to her pharmacy today with xarelto 15 mg PO BID x 21 days to be followed by 20mg  PO daily thereafter. I have scheduled her follow up to come see [place] in 2 days on 03/27/17 to discuss the PE and perform a hypercoagulable workup. It is likely provoked by her underlying malignancy. We will call the patient with the appointment time.

## 2017-03-27 NOTE — Patient Instructions (Signed)
[facility] at [facility] Discharge Instructions  RECOMMENDATIONS MADE BY THE CONSULTANT AND ANY TEST RESULTS WILL BE SENT TO YOUR REFERRING PHYSICIAN.  You were seen today by Dr. Janak Choksi   Thank you for choosing [facility] at [facility] to provide your oncology and hematology care.  To afford each patient quality time with our provider, please arrive at least 15 minutes before your scheduled appointment time.    If you have a lab appointment with the Cancer Center please come in thru the  Main Entrance and check in at the main information desk  You need to re-schedule your appointment should you arrive 10 or more minutes late.  We strive to give you quality time with our providers, and arriving late affects you and other patients whose appointments are after yours.  Also, if you no show three or more times for appointments you may be dismissed from the clinic at the providers discretion.     Again, thank you for choosing [facility].  Our hope is that these requests will decrease the amount of time that you wait before being seen by our physicians.       _____________________________________________________________  Should you have questions after your visit to [facility], please contact our office at [phone] between the hours of 8:30 a.m. and 4:30 p.m.  Voicemails left after 4:30 p.m. will not be returned until the following business day.  For prescription refill requests, have your pharmacy contact our office.       Resources For Cancer Patients and their Caregivers ? American Cancer Society: Can assist with transportation, wigs, general needs, runs Look Good Feel Better.        1-888-227-6333 ? Cancer Care: Provides financial assistance, online support groups, medication/co-pay assistance.  1-800-813-HOPE (4673) ? [facility] Assists [place] cancer patients and their  families through emotional , educational and financial support.  336-427-4357 ? [place] DSS Where to apply for food stamps, Medicaid and utility assistance. 336-342-1394 ? [facility]: Transportation to medical appointments. 336-347-2287 ? Social Security Administration: May apply for disability if have a Stage IV cancer. 336-342-7796 1-800-772-1213 ? [place] Aging, Disability and Transit Services: Assists with nutrition, care and transit needs. 336-349-2343  Cancer Center Support Programs: @10RELATIVEDAYS@ > Cancer Support Group  2nd Tuesday of the month 1pm-2pm, Journey Room  > Creative Journey  3rd Tuesday of the month 1130am-1pm, Journey Room  > Look Good Feel Better  1st Wednesday of the month 10am-12 noon, Journey Room (Call American Cancer Society to register 1-800-395-5775)    

## 2017-03-27 NOTE — Progress Notes (Signed)
HEMATOLOGY/ONCOLOGY PROGRESS NOTE  Date of Service: 03/27/2017  Patient Care Team: Earney Mallet, MD as PCP - General (Family Medicine) Danie Binder, MD as Consulting Physician (Gastroenterology) Dr Aviva Signs MD (general surgery)   CHIEF COMPLAINTS:  T3N0 colon cancer   ONCOLOGIC HISTORY:   Colon cancer ([place])   10/10/2016 Initial Diagnosis    Colon cancer ([place])     10/10/2016 Surgery    Partial colectomy by Dr. Arnoldo Morale  Pathology shows  a 2.1 cm grade 2 adenocarcinoma of the ascending colon with invasion through the muscularis propria into the peri-colorectal tissues.         11/17/2016 PET scan    The hepatic lesion seen on the CT scan and MRI does not demonstrate hypermetabolism and is likely a benign entity.  Right maxillary sinus disease with associated hypermetabolism.  Scattered pulmonary nodules, likely benign. No follow-up needed if patient is low-risk (and has no known or suspected primary neoplasm). Non-contrast chest CT can be considered in 12 months if patient is high-risk.       12/25/2016 - 02/04/2017 Adjuvant Chemotherapy    Xeloda 2000mg  PO BID take for 14 days out of 21 days. Plan for total of 24 weeks of treatment.       HISTORY OF PRESENTING ILLNESS:  Wanda Curry is a 71 y.o. female who returns for follow up of colon cancer. Pathology results of the colon flexure mass were consistent with adenocarcinoma. CT of the abdomen and pelvis on 10/08/2016 showed a 3.2 cm indeterminate low attenuation mass in the central liver. Patient was seen by Dr. Arnoldo Morale and had a partial colectomy on 10/10/2016. Pathology shows  a 2.1 cm grade 2 adenocarcinoma of the ascending colon with invasion through the muscularis propria into the peri-colorectal tissues.   Patient presented with her daughter today. Patient has been off for adjuvant Xeloda for 4 weeks now. She states that she still fatigued but it is improving. Patient is no longer spending 90% of her time in  bed. She stated that her blistering skin changes have improved as well. She states in the last few days she has noted worsening shortness of breath. She also has been having intermittent dizziness, which has improved in the past week. Patient was previously diagnosed with a cardiac condition, however she cannot member to name, and was told that it could potentially cause dizziness. She has not yet seen a cardiologist. Patient had a CT scan of chest because of shortness of breath and incidental pulmonary embolism was found.  Patient has been started on xeralto.  Here for further evaluation and treatment consideration.  Sternal nosebleed which is chronic    MEDICAL HISTORY:  Past Medical History:  Diagnosis Date  . Anemia   . Arthritis    RA  . Cancer ([place])    skin cancer  . Depression   . Diabetes mellitus without complication ([place])   . Fibromyalgia   . GERD (gastroesophageal reflux disease)   . Heart murmur    ECHO scheduled 05-25-2015  . Hyperlipidemia   . Neuropathy   . PONV (postoperative nausea and vomiting)     SURGICAL HISTORY: Past Surgical History:  Procedure Laterality Date  . ABDOMINAL HYSTERECTOMY    . BIOPSY  09/23/2016   Procedure: BIOPSY;  Surgeon: Danie Binder, MD;  [facility];  Service: Endoscopy;;  hepatic flexure mass  . CHOLECYSTECTOMY    . COLONOSCOPY WITH PROPOFOL N/A 09/23/2016   Procedure: COLONOSCOPY WITH PROPOFOL;  Surgeon: Carlyon Prows  Rexene Edison, MD;  [facility]: [facility];  Service: Endoscopy;  Laterality: N/A;  7:30 am  . HARDWARE REMOVAL Right 05/30/2015   Procedure: HARDWARE REMOVAL;  Surgeon: Ninetta Lights, MD;  Location: [facility];  Service: Orthopedics;  Laterality: Right;  . PARTIAL COLECTOMY N/A 10/10/2016   Procedure: PARTIAL COLECTOMY;  Surgeon: Aviva Signs, MD;  Location: [facility];  Service: General;  Laterality: N/A;  . planter warts Bilateral    both feet  . POLYPECTOMY  09/23/2016   Procedure: POLYPECTOMY;  Surgeon: Danie Binder, MD;   [facility];  Service: Endoscopy;;  transverse colon polyps times 2, rectal polyp  . skin grafts     due to planter warts  . TOTAL KNEE ARTHROPLASTY Right 05/30/2015   Procedure: RIGHT TOTAL KNEE ARTHROPLASTY;  Surgeon: Ninetta Lights, MD;  Location: [place];  Service: Orthopedics;  Laterality: Right;    SOCIAL HISTORY: Social History   Social History  . Marital status: Widowed    Spouse name: N/A  . Number of children: N/A  . Years of education: N/A   Occupational History  . Not on file.   Social History Main Topics  . Smoking status: Former Smoker    Packs/day: 1.00    Years: 40.00    Types: Cigarettes    Quit date: 08/18/2004  . Smokeless tobacco: Never Used  . Alcohol use No  . Drug use: No  . Sexual activity: Not Currently    Birth control/ protection: Surgical   Other Topics Concern  . Not on file   Social History Narrative  . No narrative on file    FAMILY HISTORY: Family History  Problem Relation Age of Onset  . Colon cancer Neg Hx     ALLERGIES:  is allergic to adhesive [tape]; sulfa antibiotics; and erythromycin.  MEDICATIONS:  Current Outpatient Prescriptions  Medication Sig Dispense Refill  . acetaminophen ([employer] PAIN RELIEF EXTRA STRENGTH) 500 MG tablet Take 500 mg by mouth every 6 (six) hours as needed (for pain.).    Marland Kitchen aspirin EC 325 MG tablet Take 325 mg by mouth daily.    Marland Kitchen atorvastatin (LIPITOR) 20 MG tablet Take 20 mg by mouth daily.    . Calcium Carb-Cholecalciferol (CALCIUM + D3) 600-200 MG-UNIT TABS Take 1 tablet by mouth 2 (two) times daily.  0  . capecitabine (XELODA) 500 MG tablet Take 4 tablets (2,000 mg total) by mouth 2 (two) times daily after a meal. Take on days 1-14. Repeat every 21 days. 112 tablet 5  . carvedilol (COREG) 6.25 MG tablet Take 6.25 mg by mouth 2 (two) times daily with a meal.    . folic acid (FOLVITE) 1 MG tablet Take 1 mg by mouth daily.    Marland Kitchen gabapentin (NEURONTIN) 600 MG tablet Take 600 mg by mouth 3  (three) times daily.    Marland Kitchen GLUCOSAMINE-CHONDROITIN PO Take 1 tablet by mouth 2 (two) times daily.    . hydrochlorothiazide (HYDRODIURIL) 25 MG tablet Take 25 mg by mouth daily.    . Insulin Glargine (BASAGLAR KWIKPEN [place]) Inject 42 Units into the skin every morning.    . magnesium oxide (MAG-OX) 400 (241.3 Mg) MG tablet Take 1 tablet (400 mg total) by mouth daily. 60 tablet 0  . meclizine (ANTIVERT) 32 MG tablet Take 1 tablet (32 mg total) by mouth 3 (three) times daily as needed for dizziness. 30 tablet 0  . methotrexate (RHEUMATREX) 2.5 MG tablet Take 20 mg by mouth every Sunday.  Caution:Chemotherapy. Protect from light.     . Milnacipran HCl (SAVELLA) 100 MG TABS tablet Take 100 mg by mouth 2 (two) times daily.    . Multiple Vitamins-Minerals (CENTRUM SILVER PO) Take 1 tablet by mouth daily.    Marland Kitchen NOVOLOG MIX 70/30 FLEXPEN (70-30) 100 UNIT/ML FlexPen INJECT 30 UNITS SUBCUTANEOUSLY TWICE DAILY AT BREAKFAST AND DINNER  3  . omeprazole (PRILOSEC) 40 MG capsule TAKE ONE CAPSULE BY MOUTH ONCE A DAY  3  . ondansetron (ZOFRAN) 8 MG tablet Take 1 tablet (8 mg total) by mouth every 8 (eight) hours as needed for nausea or vomiting. 80 tablet 1  . Rivaroxaban (XARELTO) 15 MG TABS tablet Take 1 tablet (15 mg total) by mouth 2 (two) times daily with a meal. 42 tablet 0  . rOPINIRole (REQUIP) 1 MG tablet Take 1 mg by mouth 3 (three) times daily.     No current facility-administered medications for this visit.    Review of Systems  Constitutional: Positive for malaise/fatigue.       No loss of appetite   HENT: Negative.   Eyes: Negative.   Respiratory: Positive for shortness of breath.   Cardiovascular: Positive for leg swelling. Negative for chest pain and palpitations.  Gastrointestinal: Negative.  Negative for abdominal pain, blood in stool, diarrhea and melena.  Genitourinary: Negative.   Musculoskeletal: Negative for joint pain (RA) and myalgias (fibromyalgia).       Cramping in her toes and ankles  at night  Skin: Negative.   Neurological: Positive for dizziness.  Endo/Heme/Allergies: Negative.   Psychiatric/Behavioral: Negative.  The patient is not nervous/anxious.   All other systems reviewed and are negative. 14 point review of systems was performed and is negative except as detailed under history of present illness and above  PHYSICAL EXAMINATION: ECOG PERFORMANCE STATUS: 1 - Symptomatic but completely ambulatory  Vitals:   03/27/17 1412  BP: (!) 144/55  Pulse: 93  Resp: (!) 22  Temp: 98.4 F (36.9 C)  SpO2: 93%   Filed Weights   03/27/17 1412  Weight: 249 lb (112.9 kg)   .Body mass index is 40.81 kg/m.   Physical Exam  Constitutional: She is oriented to person, place, and time and well-developed, well-nourished, and in no distress.  HENT:  Head: Normocephalic and atraumatic.  Eyes: Pupils are equal, round, and reactive to light. EOM are normal.  Neck: Normal range of motion. Neck supple.  Cardiovascular: Normal rate, regular rhythm and normal heart sounds.   Pulmonary/Chest: Effort normal and breath sounds normal.  Abdominal: Soft. Bowel sounds are normal.  Musculoskeletal: Normal range of motion. She exhibits edema.  Neurological: She is alert and oriented to person, place, and time. Gait normal.  Skin: Skin is warm and dry. No rash noted. No erythema.  Psychiatric: Mood, memory, affect and judgment normal.  Nursing note and vitals reviewed.  LABORATORY DATA:  I have reviewed the data as listed  CBC Latest Ref Rng & Units 02/19/2017 01/28/2017 12/31/2016  WBC 4.0 - 10.5 K/uL 11.8(H) 9.9 9.4  Hemoglobin 12.0 - 15.0 g/dL 11.1(L) 10.5(L) 11.1(L)  Hematocrit 36.0 - 46.0 % 34.3(L) 31.3(L) 34.3(L)  Platelets 150 - 400 K/uL 191 129(L) 292   CMP Latest Ref Rng & Units 02/19/2017 01/28/2017 12/31/2016  Glucose 65 - 99 mg/dL 291(H) 218(H) 96  BUN 6 - 20 mg/dL 15 21(H) 21(H)  Creatinine 0.44 - 1.00 mg/dL 1.16(H) 1.01(H) 0.95  Sodium 135 - 145 mmol/L 137 138 139    Potassium 3.5 -  5.1 mmol/L 4.1 3.7 4.1  Chloride 101 - 111 mmol/L 100(L) 98(L) 100(L)  CO2 22 - 32 mmol/L 27 28 32  Calcium 8.9 - 10.3 mg/dL 8.9 8.6(L) 9.1  Total Protein 6.5 - 8.1 g/dL 6.7 6.1(L) 6.8  Total Bilirubin 0.3 - 1.2 mg/dL 0.6 1.1 0.9  Alkaline Phos 38 - 126 U/L 59 49 47  AST 15 - 41 U/L 28 27 24   ALT 14 - 54 U/L 25 27 29    PATHOLOGY:      Colonoscopy 09/23/2016 - One 25 mm, recently bleeding polyp in the proximal ascending colon, NOT REMOVED. - Heme positive stools/hematichezia due to malignant tumor at the hepatic flexure AND COLON POLYPS. Biopsied. - Diverticulosis in the sigmoid colon and in the descending colon.  RADIOGRAPHIC STUDIES: I have personally reviewed the radiological images as listed and agreed with the findings in the report.  Initial PET scan 11/17/2016 IMPRESSION: The hepatic lesion seen on the CT scan and MRI does not demonstrate hypermetabolism and is likely a benign entity.  Right maxillary sinus disease with associated hypermetabolism.  Scattered pulmonary nodules, likely benign.  MRI Liver 10/28/2016 IMPRESSION: 1. Indeterminate lesion within the central RIGHT hepatic lobe demonstrates early enhancement and is therefore concerning. Differential would include atypical focal fatty infiltration, atypical focal nodular hyperplasia, and colorectal carcinoma metastasis. Low CEA and negative lymph nodes would weight against colorectal carcinoma metastasis. Pericolonic fat invasion is an aggressive feature. Lesion warrants further evaluation with FDG PET scan or follow-up MRI or CT in 1 to 3 months. 2. No additional evidence of metastatic disease.  ASSESSMENT & PLAN:  Cancer Staging Colon cancer [facility]) Staging form: Colon and Rectum, AJCC 8th Edition - Clinical: Stage IIA (cT3, cN0, cM0) - Signed by [name], MD on 11/26/2016   71 year old very pleasant lady with  1) Stage IIA colon adenocarcinoma.  She has stage II high risk disease  in the presence of lymphovascular invasion and high grade (poorly differentiated) tumor. Patient has opted for adjuvant treatment with Xeloda 1000 mg/m2 PO qBID 1-14 every 3 weeks x 24 weeks. She started taking her Xeloda on 12/25/16. Patient took xeloda up to cycle 2 day 13, however discontinued the chemo due to intolerable side effects including debilitating fatigue, hand-foot syndrome, mucositis, and diarrhea.  2.  Diagnosis of pulmonary embolism with a CT scan started on xeralto. Patient was advised to call us if she starts bleeding and started xeralto right away. Patient has another CT scan scheduled on October 5 which   been canceled and a delayed 3 months from today  NCCN guidelines for surveillance for Colon cancer are as follows (1.2017): A. Stage I 1. Colonoscopy at year 1 A. If advanced adenoma, repeat in 1 year B. If no advanced adenoma, repeat in 3 years, and then every 5 years.  B. Stage II, Stage III 1. H+P every 3-6 months x 2 years and then every 6 months for a total of 5 years  2. CEA every 3-6 months x 2 years and then every 6 months for a total of 5 years  3. CT CAP every 6-12 months (category 2B for frequency < 12 months) for a total of 5 years . 4.  Colonoscopy in 1 year except if no preoperative colonoscopy due to obstructing lesion, colonoscopy in 3-6 months.  A. If advanced adenoma, repeat in 1 year B. If no advanced adenoma, repeat in 3 years, then every 5 years 5. PET/CT scan is not recommended.   C. Stage IV  1. H+P every 3-6 months x 2 years and then every 6 months for a total of 5 years  2. CEA every 3 months x 2 years and then every 6 months for a total of 3- 5 years  3. CT CAP every 3-6 months (category 2B for frequency < 6 months) x 2 years., then every 6-12 months for a total of 5 years . 4. Colonoscopy in 1 year except if no preoperative colonoscopy due to obstructing lesion, colonoscopy in 3-6 months.  A. If advanced adenoma, repeat in 1 year B. If no  advanced adenoma, repeat in 3 years, then every 5 years   2)Dizziness I have prescribed meclizine TID PRN for the patient. I have recommended for her to see her cardiologist to make sure her dizziness, worsening LE edema, and new onset SOB are not cardiac related.   LABS:  CBC, CMP, CEA prior to next visit. DISPO: RTC in 3 months for follow up and to review scans.   All of the patients questions were answered with apparent satisfaction. The patient knows to call the clinic with any problems, questions or concerns.    This note was electronically signed.  [name], MD 03/27/2017 2:21 PM

## 2017-04-15 ENCOUNTER — Telehealth (HOSPITAL_COMMUNITY): Payer: Self-pay

## 2017-04-15 DIAGNOSIS — C189 Malignant neoplasm of colon, unspecified: Secondary | ICD-10-CM

## 2017-04-15 DIAGNOSIS — I2699 Other pulmonary embolism without acute cor pulmonale: Secondary | ICD-10-CM | POA: Insufficient documentation

## 2017-04-15 HISTORY — DX: Other pulmonary embolism without acute cor pulmonale: I26.99

## 2017-04-15 MED ORDER — RIVAROXABAN 20 MG PO TABS: 20.0000 mg | ORAL_TABLET | Freq: Every day | ORAL | 2 refills | Status: DC

## 2017-04-15 NOTE — Telephone Encounter (Signed)
Patients daughter called stating patient needs a refill on Xarelto. Reviewed Dr. Laverle Patter last encounter in chart and saw where patient is supposed to be starting Xarelto 20 mg after finishing the starter dose. Reviewed with provider. New prescription sent to patients pharmacy.

## 2017-04-30 ENCOUNTER — Telehealth (HOSPITAL_COMMUNITY): Payer: Self-pay

## 2017-04-30 ENCOUNTER — Other Ambulatory Visit (HOSPITAL_COMMUNITY): Payer: Self-pay | Admitting: Oncology

## 2017-04-30 MED ORDER — CEPHALEXIN 500 MG PO CAPS: 500.0000 mg | ORAL_CAPSULE | Freq: Three times a day (TID) | ORAL | 0 refills | Status: AC

## 2017-04-30 NOTE — Telephone Encounter (Signed)
Patients daughter called and stated the patient continues to have sores on her arms. They have been there since June when patient had to stop taking the Xeloda due to side effects. Daughter states the places are red and have "pus" in them. Reviewed with Dr. Talbert Cage who prescribed Keflex tid x 10 days. Prescription e-scribed to patients pharmacy. Notified patients daughter. Also told daughter that if patient did not get better with this medication to let us know and we would refer to her a dermatologist. Daughter verbalized understanding.

## 2017-05-11 ENCOUNTER — Encounter (HOSPITAL_COMMUNITY): Payer: Medicare Other

## 2017-05-11 ENCOUNTER — Encounter (HOSPITAL_COMMUNITY): Payer: Self-pay | Admitting: Oncology

## 2017-05-11 ENCOUNTER — Encounter (HOSPITAL_COMMUNITY): Payer: Medicare Other | Attending: Oncology | Admitting: Oncology

## 2017-05-11 VITALS — BP 131/43 | HR 83 | Resp 18 | Ht 65.0 in | Wt 251.6 lb

## 2017-05-11 DIAGNOSIS — L989 Disorder of the skin and subcutaneous tissue, unspecified: Secondary | ICD-10-CM | POA: Diagnosis not present

## 2017-05-11 DIAGNOSIS — C182 Malignant neoplasm of ascending colon: Secondary | ICD-10-CM | POA: Diagnosis not present

## 2017-05-11 DIAGNOSIS — C183 Malignant neoplasm of hepatic flexure: Secondary | ICD-10-CM

## 2017-05-11 DIAGNOSIS — I2699 Other pulmonary embolism without acute cor pulmonale: Secondary | ICD-10-CM

## 2017-05-11 DIAGNOSIS — C189 Malignant neoplasm of colon, unspecified: Secondary | ICD-10-CM | POA: Insufficient documentation

## 2017-05-11 LAB — COMPREHENSIVE METABOLIC PANEL
ALT: 26 U/L (ref 14–54)
AST: 30 U/L (ref 15–41)
Albumin: 3.3 g/dL — ABNORMAL LOW (ref 3.5–5.0)
Alkaline Phosphatase: 50 U/L (ref 38–126)
Anion gap: 9 (ref 5–15)
BUN: 14 mg/dL (ref 6–20)
CO2: 32 mmol/L (ref 22–32)
Calcium: 9.3 mg/dL (ref 8.9–10.3)
Chloride: 96 mmol/L — ABNORMAL LOW (ref 101–111)
Creatinine, Ser: 0.94 mg/dL (ref 0.44–1.00)
GFR calc Af Amer: >60 mL/min (ref >60)
GFR calc non Af Amer: 59 mL/min — ABNORMAL LOW (ref >60)
Glucose, Bld: 125 mg/dL — ABNORMAL HIGH (ref 65–99)
Potassium: 3.1 mmol/L — ABNORMAL LOW (ref 3.5–5.1)
Sodium: 137 mmol/L (ref 135–145)
Total Bilirubin: 0.2 mg/dL — ABNORMAL LOW (ref 0.3–1.2)
Total Protein: 6.8 g/dL (ref 6.5–8.1)

## 2017-05-11 LAB — CBC WITH DIFFERENTIAL/PLATELET
Basophils Absolute: 0 10*3/uL (ref 0.0–0.1)
Basophils Relative: 0 %
Eosinophils Absolute: 0.2 10*3/uL (ref 0.0–0.7)
Eosinophils Relative: 2 %
HCT: 32.2 % — ABNORMAL LOW (ref 36.0–46.0)
Hemoglobin: 10.5 g/dL — ABNORMAL LOW (ref 12.0–15.0)
Lymphocytes Relative: 22 %
Lymphs Abs: 2.1 10*3/uL (ref 0.7–4.0)
MCH: 27.5 pg (ref 26.0–34.0)
MCHC: 32.6 g/dL (ref 30.0–36.0)
MCV: 84.3 fL (ref 78.0–100.0)
Monocytes Absolute: 0.8 10*3/uL (ref 0.1–1.0)
Monocytes Relative: 9 %
Neutro Abs: 6.2 10*3/uL (ref 1.7–7.7)
Neutrophils Relative %: 67 %
Platelets: 282 10*3/uL (ref 150–400)
RBC: 3.82 MIL/uL — ABNORMAL LOW (ref 3.87–5.11)
RDW: 17 % — ABNORMAL HIGH (ref 11.5–15.5)
WBC: 9.3 10*3/uL (ref 4.0–10.5)

## 2017-05-11 NOTE — Progress Notes (Signed)
HEMATOLOGY/ONCOLOGY PROGRESS NOTE  Date of Service: 05/11/2017  Patient Care Team: Earney Mallet, MD as PCP - General (Family Medicine) Danie Binder, MD as Consulting Physician (Gastroenterology) Dr Aviva Signs MD (general surgery)   CHIEF COMPLAINTS:  T3N0 colon cancer   ONCOLOGIC HISTORY:   Colon cancer ([place])   10/10/2016 Initial Diagnosis    Colon cancer ([place])     10/10/2016 Surgery    Partial colectomy by Dr. Arnoldo Morale  Pathology shows  a 2.1 cm grade 2 adenocarcinoma of the ascending colon with invasion through the muscularis propria into the peri-colorectal tissues.         11/17/2016 PET scan    The hepatic lesion seen on the CT scan and MRI does not demonstrate hypermetabolism and is likely a benign entity.  Right maxillary sinus disease with associated hypermetabolism.  Scattered pulmonary nodules, likely benign. No follow-up needed if patient is low-risk (and has no known or suspected primary neoplasm). Non-contrast chest CT can be considered in 12 months if patient is high-risk.       12/25/2016 - 02/04/2017 Adjuvant Chemotherapy    Xeloda 2000mg  PO BID take for 14 days out of 21 days. Plan for total of 24 weeks of treatment.       HISTORY OF PRESENTING ILLNESS:  Wanda Curry is a 71 y.o. female who returns for follow up of colon cancer. Pathology results of the colon flexure mass were consistent with adenocarcinoma. CT of the abdomen and pelvis on 10/08/2016 showed a 3.2 cm indeterminate low attenuation mass in the central liver. Patient was seen by Dr. Arnoldo Morale and had a partial colectomy on 10/10/2016. Pathology shows  a 2.1 cm grade 2 adenocarcinoma of the ascending colon with invasion through the muscularis propria into the peri-colorectal tissues.   Patient presented with her daughter today. Since her last visit she's had blistering on her bilateral arms as well as one lesion on her left leg due to skin toxicity from xeloda. Patient has been picking  at them and they got infected. I called in a Rx for keflex x10 days for her on 04/30/17. Patient states that since she's been taking the keflex her skin lesions have improved and are healing. She continues to take xarelto for her PE and has been tolerating it well without any major bleeding complications. She states her bowel movements are fine. She has swelling on her legs. She denies any chest pain, shortness of breath, appetite changes, abdominal pain, focal weakness.    MEDICAL HISTORY:  Past Medical History:  Diagnosis Date  . Anemia   . Arthritis    RA  . Cancer ([place])    skin cancer  . Depression   . Diabetes mellitus without complication ([place])   . Fibromyalgia   . GERD (gastroesophageal reflux disease)   . Heart murmur    ECHO scheduled 05-25-2015  . Hyperlipidemia   . Neuropathy   . PONV (postoperative nausea and vomiting)     SURGICAL HISTORY: Past Surgical History:  Procedure Laterality Date  . ABDOMINAL HYSTERECTOMY    . BIOPSY  09/23/2016   Procedure: BIOPSY;  Surgeon: Danie Binder, MD;  [facility];  Service: Endoscopy;;  hepatic flexure mass  . CHOLECYSTECTOMY    . COLONOSCOPY WITH PROPOFOL N/A 09/23/2016   Procedure: COLONOSCOPY WITH PROPOFOL;  Surgeon: Danie Binder, MD;  [facility];  Service: Endoscopy;  Laterality: N/A;  7:30 am  . HARDWARE REMOVAL Right 05/30/2015   Procedure: HARDWARE REMOVAL;  Surgeon: Ninetta Lights, MD;  Location: [facility];  Service: Orthopedics;  Laterality: Right;  . PARTIAL COLECTOMY N/A 10/10/2016   Procedure: PARTIAL COLECTOMY;  Surgeon: Aviva Signs, MD;  Location: [facility];  Service: General;  Laterality: N/A;  . planter warts Bilateral    both feet  . POLYPECTOMY  09/23/2016   Procedure: POLYPECTOMY;  Surgeon: Danie Binder, MD;  [facility];  Service: Endoscopy;;  transverse colon polyps times 2, rectal polyp  . skin grafts     due to planter warts  . TOTAL KNEE ARTHROPLASTY Right 05/30/2015    Procedure: RIGHT TOTAL KNEE ARTHROPLASTY;  Surgeon: Ninetta Lights, MD;  Location: [facility];  Service: Orthopedics;  Laterality: Right;    SOCIAL HISTORY: Social History   Social History  . Marital status: Widowed    Spouse name: N/A  . Number of children: N/A  . Years of education: N/A   Occupational History  . Not on file.   Social History Main Topics  . Smoking status: Former Smoker    Packs/day: 1.00    Years: 40.00    Types: Cigarettes    Quit date: 08/18/2004  . Smokeless tobacco: Never Used  . Alcohol use No  . Drug use: No  . Sexual activity: Not Currently    Birth control/ protection: Surgical   Other Topics Concern  . Not on file   Social History Narrative  . No narrative on file    FAMILY HISTORY: Family History  Problem Relation Age of Onset  . Colon cancer Neg Hx     ALLERGIES:  is allergic to adhesive [tape]; sulfa antibiotics; and erythromycin.  MEDICATIONS:  Current Outpatient Prescriptions  Medication Sig Dispense Refill  . acetaminophen ([employer] PAIN RELIEF EXTRA STRENGTH) 500 MG tablet Take 500 mg by mouth every 6 (six) hours as needed (for pain.).    Marland Kitchen atorvastatin (LIPITOR) 20 MG tablet Take 20 mg by mouth daily.    . Calcium Carb-Cholecalciferol (CALCIUM + D3) 600-200 MG-UNIT TABS Take 1 tablet by mouth 2 (two) times daily.  0  . carvedilol (COREG) 6.25 MG tablet Take 6.25 mg by mouth 2 (two) times daily with a meal.    . folic acid (FOLVITE) 1 MG tablet Take 1 mg by mouth daily.    Marland Kitchen gabapentin (NEURONTIN) 600 MG tablet Take 600 mg by mouth 3 (three) times daily.    Marland Kitchen GLUCOSAMINE-CHONDROITIN PO Take 1 tablet by mouth 2 (two) times daily.    . hydrochlorothiazide (HYDRODIURIL) 25 MG tablet Take 25 mg by mouth daily.    . Insulin Glargine (BASAGLAR KWIKPEN ) Inject 42 Units into the skin every morning.    . methotrexate (RHEUMATREX) 2.5 MG tablet Take 20 mg by mouth every Sunday. Caution:Chemotherapy. Protect from light.     . Milnacipran HCl  (SAVELLA) 100 MG TABS tablet Take 100 mg by mouth 2 (two) times daily.    . Multiple Vitamins-Minerals (CENTRUM SILVER PO) Take 1 tablet by mouth daily.    Marland Kitchen NOVOLOG MIX 70/30 FLEXPEN (70-30) 100 UNIT/ML FlexPen INJECT 30 UNITS SUBCUTANEOUSLY TWICE DAILY AT BREAKFAST AND DINNER  3  . omeprazole (PRILOSEC) 40 MG capsule TAKE ONE CAPSULE BY MOUTH ONCE A DAY  3  . rivaroxaban (XARELTO) 20 MG TABS tablet Take 1 tablet (20 mg total) by mouth daily with supper. 30 tablet 2  . rOPINIRole (REQUIP) 1 MG tablet Take 1 mg by mouth 3 (three) times daily.    Marland Kitchen aspirin EC  325 MG tablet Take 325 mg by mouth daily.    . capecitabine (XELODA) 500 MG tablet Take 4 tablets (2,000 mg total) by mouth 2 (two) times daily after a meal. Take on days 1-14. Repeat every 21 days. (Patient not taking: Reported on 05/11/2017) 112 tablet 5  . magnesium oxide (MAG-OX) 400 (241.3 Mg) MG tablet Take 1 tablet (400 mg total) by mouth daily. (Patient not taking: Reported on 05/11/2017) 60 tablet 0  . meclizine (ANTIVERT) 32 MG tablet Take 1 tablet (32 mg total) by mouth 3 (three) times daily as needed for dizziness. (Patient not taking: Reported on 05/11/2017) 30 tablet 0  . ondansetron (ZOFRAN) 8 MG tablet Take 1 tablet (8 mg total) by mouth every 8 (eight) hours as needed for nausea or vomiting. (Patient not taking: Reported on 05/11/2017) 80 tablet 1   No current facility-administered medications for this visit.    Review of Systems  Constitutional: Negative for malaise/fatigue.       No loss of appetite   HENT: Negative.   Eyes: Negative.   Respiratory: Negative for shortness of breath.   Cardiovascular: Positive for leg swelling. Negative for chest pain and palpitations.  Gastrointestinal: Negative.  Negative for abdominal pain, blood in stool, diarrhea and melena.  Genitourinary: Negative.   Musculoskeletal: Positive for joint pain (RA) and myalgias (fibromyalgia).  Skin: Negative.        Skin lesions on her arms and one on  her left leg  Neurological: Negative for dizziness.  Endo/Heme/Allergies: Negative.   Psychiatric/Behavioral: Negative.  The patient is not nervous/anxious.   All other systems reviewed and are negative. 14 point review of systems was performed and is negative except as detailed under history of present illness and above  PHYSICAL EXAMINATION: ECOG PERFORMANCE STATUS: 1 - Symptomatic but completely ambulatory  Vitals:   05/11/17 1109  BP: (!) 131/43  Pulse: 83  Resp: 18  SpO2: 100%   Filed Weights   05/11/17 1109  Weight: 251 lb 9.6 oz (114.1 kg)   .Body mass index is 41.87 kg/m.   Physical Exam  Constitutional: She is oriented to person, place, and time and well-developed, well-nourished, and in no distress.  HENT:  Head: Normocephalic and atraumatic.  Eyes: Pupils are equal, round, and reactive to light. EOM are normal.  Neck: Normal range of motion. Neck supple.  Cardiovascular: Normal rate, regular rhythm and normal heart sounds.   Pulmonary/Chest: Effort normal and breath sounds normal.  Abdominal: Soft. Bowel sounds are normal.  Musculoskeletal: Normal range of motion. She exhibits edema.  Neurological: She is alert and oriented to person, place, and time. Gait normal.  Skin: Skin is warm and dry. No rash noted. No erythema.  Multiple skin lesions on bilateral arms, healing. Left leg lesion 1.5 cm is healing as well.  Psychiatric: Mood, memory, affect and judgment normal.  Nursing note and vitals reviewed.  LABORATORY DATA:  I have reviewed the data as listed  CBC Latest Ref Rng & Units 05/11/2017 02/19/2017 01/28/2017  WBC 4.0 - 10.5 K/uL 9.3 11.8(H) 9.9  Hemoglobin 12.0 - 15.0 g/dL 10.5(L) 11.1(L) 10.5(L)  Hematocrit 36.0 - 46.0 % 32.2(L) 34.3(L) 31.3(L)  Platelets 150 - 400 K/uL 282 191 129(L)   CMP Latest Ref Rng & Units 05/11/2017 02/19/2017 01/28/2017  Glucose 65 - 99 mg/dL 125(H) 291(H) 218(H)  BUN 6 - 20 mg/dL 14 15 21(H)  Creatinine 0.44 - 1.00 mg/dL 0.94  1.16(H) 1.01(H)  Sodium 135 - 145 mmol/L 137 137  138  Potassium 3.5 - 5.1 mmol/L 3.1(L) 4.1 3.7  Chloride 101 - 111 mmol/L 96(L) 100(L) 98(L)  CO2 22 - 32 mmol/L 32 27 28  Calcium 8.9 - 10.3 mg/dL 9.3 8.9 8.6(L)  Total Protein 6.5 - 8.1 g/dL 6.8 6.7 6.1(L)  Total Bilirubin 0.3 - 1.2 mg/dL 0.2(L) 0.6 1.1  Alkaline Phos 38 - 126 U/L 50 59 49  AST 15 - 41 U/L 30 28 27   ALT 14 - 54 U/L 26 25 27    PATHOLOGY:      Colonoscopy 09/23/2016 - One 25 mm, recently bleeding polyp in the proximal ascending colon, NOT REMOVED. - Heme positive stools/hematichezia due to malignant tumor at the hepatic flexure AND COLON POLYPS. Biopsied. - Diverticulosis in the sigmoid colon and in the descending colon.  RADIOGRAPHIC STUDIES: I have personally reviewed the radiological images as listed and agreed with the findings in the report.  Initial PET scan 11/17/2016 IMPRESSION: The hepatic lesion seen on the CT scan and MRI does not demonstrate hypermetabolism and is likely a benign entity.  Right maxillary sinus disease with associated hypermetabolism.  Scattered pulmonary nodules, likely benign.  MRI Liver 10/28/2016 IMPRESSION: 1. Indeterminate lesion within the central RIGHT hepatic lobe demonstrates early enhancement and is therefore concerning. Differential would include atypical focal fatty infiltration, atypical focal nodular hyperplasia, and colorectal carcinoma metastasis. Low CEA and negative lymph nodes would weight against colorectal carcinoma metastasis. Pericolonic fat invasion is an aggressive feature. Lesion warrants further evaluation with FDG PET scan or follow-up MRI or CT in 1 to 3 months. 2. No additional evidence of metastatic disease.  ASSESSMENT & PLAN:  Cancer Staging Colon cancer [facility]) Staging form: Colon and Rectum, AJCC 8th Edition - Clinical: Stage IIA (cT3, cN0, cM0) - Signed by [name], MD on 11/26/2016   71year-old very pleasant lady with  1) Stage IIA  colon adenocarcinoma.  She has stage II high risk disease in the presence of lymphovascular invasion and high grade (poorly differentiated) tumor. Patient has opted for adjuvant treatment with Xeloda 1000 mg/m2 PO qBID 1-14 every 3 weeks x 24 weeks. She started taking her Xeloda on 12/25/16. Patient took xeloda up to cycle 2 day 13, however discontinued the chemo due to intolerable side effects including debilitating fatigue, hand-foot syndrome, mucositis, and diarrhea.  Clinically NED. Repeat CT C/A/P with contrast scheduled for 06/23/17. She will need a repeat colonoscopy in September 2018 at the 6 month mark.  NCCN guidelines for surveillance for Colon cancer are as follows (1.2017): A. Stage I 1. Colonoscopy at year 1 A. If advanced adenoma, repeat in 1 year B. If no advanced adenoma, repeat in 3 years, and then every 5 years.  B. Stage II, Stage III 1. H+P every 3-6 months x 2 years and then every 6 months for a total of 5 years  2. CEA every 3-6 months x 2 years and then every 6 months for a total of 5 years  3. CT CAP every 6-12 months (category 2B for frequency < 12 months) for a total of 5 years . 4.  Colonoscopy in 1 year except if no preoperative colonoscopy due to obstructing lesion, colonoscopy in 3-6 months.  A. If advanced adenoma, repeat in 1 year B. If no advanced adenoma, repeat in 3 years, then every 5 years 5. PET/CT scan is not recommended.   C. Stage IV 1. H+P every 3-6 months x 2 years and then every 6 months for a total of 5 years  2. CEA every 3 months x 2 years and then every 6 months for a total of 3- 5 years  3. CT CAP every 3-6 months (category 2B for frequency < 6 months) x 2 years., then every 6-12 months for a total of 5 years . 4. Colonoscopy in 1 year except if no preoperative colonoscopy due to obstructing lesion, colonoscopy in 3-6 months.  A. If advanced adenoma, repeat in 1 year B. If no advanced adenoma, repeat in 3 years, then every 5  years   2)Skin lesions due to skin toxicity from xeloda -continue keflex. -counseled patient to not pick at her skin lesions since this will cause them to get infected, she verbalized understanding.  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 -Will recheck her CTA chest at the 6 month mark which will be February 2019. Continue xarelto. -Will do a full hypercoagulability workup once she is off anticoagulation since xarelto can skew some of the results on the hypercoagulability workup.    LABS:  CBC, CMP, CEA prior to next visit. DISPO: RTC in 4 months for follow up.  All of the patients questions were answered with apparent satisfaction. The patient knows to call the clinic with any problems, questions or concerns.    This note was electronically signed.  [name], MD 05/11/2017 11:20 AM

## 2017-05-12 LAB — CEA: CEA: 4.9 ng/mL — ABNORMAL HIGH (ref 0.0–4.7)

## 2017-05-22 ENCOUNTER — Ambulatory Visit (HOSPITAL_COMMUNITY): Payer: Medicare Other

## 2017-05-25 ENCOUNTER — Ambulatory Visit (HOSPITAL_COMMUNITY): Payer: Medicare Other

## 2017-06-08 ENCOUNTER — Other Ambulatory Visit (HOSPITAL_COMMUNITY): Payer: Self-pay | Admitting: *Deleted

## 2017-06-08 DIAGNOSIS — I2699 Other pulmonary embolism without acute cor pulmonale: Secondary | ICD-10-CM

## 2017-06-08 DIAGNOSIS — C189 Malignant neoplasm of colon, unspecified: Secondary | ICD-10-CM

## 2017-06-09 MED ORDER — RIVAROXABAN 20 MG PO TABS: 20.0000 mg | ORAL_TABLET | Freq: Every day | ORAL | 0 refills | Status: DC

## 2017-06-23 ENCOUNTER — Ambulatory Visit (HOSPITAL_COMMUNITY)
Admission: RE | Admit: 2017-06-23 | Discharge: 2017-06-23 | Disposition: A | Discharge status: Home / Self Care | Payer: Medicare Other | Source: Ambulatory Visit | Attending: Oncology | Admitting: Oncology

## 2017-06-23 DIAGNOSIS — R918 Other nonspecific abnormal finding of lung field: Secondary | ICD-10-CM | POA: Diagnosis not present

## 2017-06-23 DIAGNOSIS — K769 Liver disease, unspecified: Secondary | ICD-10-CM | POA: Insufficient documentation

## 2017-06-23 DIAGNOSIS — C189 Malignant neoplasm of colon, unspecified: Secondary | ICD-10-CM | POA: Diagnosis present

## 2017-06-23 MED ORDER — IOPAMIDOL (ISOVUE-300) INJECTION 61%
100.0000 mL | Freq: Once | INTRAVENOUS | Status: AC | PRN
Start: 2017-06-23 — End: 2017-06-23
  Administered 2017-06-23: 100 mL via INTRAVENOUS

## 2017-06-26 ENCOUNTER — Encounter (HOSPITAL_COMMUNITY): Payer: Self-pay | Admitting: Oncology

## 2017-06-26 ENCOUNTER — Encounter (HOSPITAL_COMMUNITY): Payer: Medicare Other

## 2017-06-26 ENCOUNTER — Encounter (HOSPITAL_COMMUNITY): Payer: Medicare Other | Attending: Hematology | Admitting: Oncology

## 2017-06-26 VITALS — BP 163/71 | HR 101 | Temp 97.6°F | Resp 16 | Wt 250.5 lb

## 2017-06-26 DIAGNOSIS — Z7901 Long term (current) use of anticoagulants: Secondary | ICD-10-CM

## 2017-06-26 DIAGNOSIS — M7989 Other specified soft tissue disorders: Secondary | ICD-10-CM

## 2017-06-26 DIAGNOSIS — I2699 Other pulmonary embolism without acute cor pulmonale: Secondary | ICD-10-CM | POA: Diagnosis not present

## 2017-06-26 DIAGNOSIS — Z7982 Long term (current) use of aspirin: Secondary | ICD-10-CM | POA: Diagnosis not present

## 2017-06-26 DIAGNOSIS — M069 Rheumatoid arthritis, unspecified: Secondary | ICD-10-CM | POA: Diagnosis not present

## 2017-06-26 DIAGNOSIS — C183 Malignant neoplasm of hepatic flexure: Secondary | ICD-10-CM

## 2017-06-26 DIAGNOSIS — K5730 Diverticulosis of large intestine without perforation or abscess without bleeding: Secondary | ICD-10-CM | POA: Insufficient documentation

## 2017-06-26 DIAGNOSIS — K219 Gastro-esophageal reflux disease without esophagitis: Secondary | ICD-10-CM | POA: Diagnosis not present

## 2017-06-26 DIAGNOSIS — Z794 Long term (current) use of insulin: Secondary | ICD-10-CM | POA: Insufficient documentation

## 2017-06-26 DIAGNOSIS — R911 Solitary pulmonary nodule: Secondary | ICD-10-CM

## 2017-06-26 DIAGNOSIS — C189 Malignant neoplasm of colon, unspecified: Secondary | ICD-10-CM

## 2017-06-26 DIAGNOSIS — Z9889 Other specified postprocedural states: Secondary | ICD-10-CM | POA: Insufficient documentation

## 2017-06-26 DIAGNOSIS — D649 Anemia, unspecified: Secondary | ICD-10-CM | POA: Insufficient documentation

## 2017-06-26 DIAGNOSIS — C182 Malignant neoplasm of ascending colon: Secondary | ICD-10-CM | POA: Diagnosis not present

## 2017-06-26 DIAGNOSIS — M797 Fibromyalgia: Secondary | ICD-10-CM | POA: Insufficient documentation

## 2017-06-26 DIAGNOSIS — E1140 Type 2 diabetes mellitus with diabetic neuropathy, unspecified: Secondary | ICD-10-CM | POA: Insufficient documentation

## 2017-06-26 DIAGNOSIS — K769 Liver disease, unspecified: Secondary | ICD-10-CM | POA: Diagnosis not present

## 2017-06-26 DIAGNOSIS — Z85828 Personal history of other malignant neoplasm of skin: Secondary | ICD-10-CM | POA: Diagnosis not present

## 2017-06-26 DIAGNOSIS — Z87891 Personal history of nicotine dependence: Secondary | ICD-10-CM | POA: Insufficient documentation

## 2017-06-26 DIAGNOSIS — L989 Disorder of the skin and subcutaneous tissue, unspecified: Secondary | ICD-10-CM

## 2017-06-26 DIAGNOSIS — Z9049 Acquired absence of other specified parts of digestive tract: Secondary | ICD-10-CM | POA: Insufficient documentation

## 2017-06-26 DIAGNOSIS — F329 Major depressive disorder, single episode, unspecified: Secondary | ICD-10-CM | POA: Insufficient documentation

## 2017-06-26 DIAGNOSIS — R970 Elevated carcinoembryonic antigen [CEA]: Secondary | ICD-10-CM

## 2017-06-26 DIAGNOSIS — E785 Hyperlipidemia, unspecified: Secondary | ICD-10-CM | POA: Diagnosis not present

## 2017-06-26 NOTE — Patient Instructions (Signed)
[place] at [facility] Discharge Instructions  RECOMMENDATIONS MADE BY THE CONSULTANT AND ANY TEST RESULTS WILL BE SENT TO YOUR REFERRING PHYSICIAN.  Seen by Dr. Jeb Levering today Follow up in 3 months Pet scan in January Labs today and on follow up   Thank you for choosing [place] at [facility] to provide your oncology and hematology care.  To afford each patient quality time with our provider, please arrive at least 15 minutes before your scheduled appointment time.    If you have a lab appointment with the [facility] please come in thru the  Main Entrance and check in at the main information desk  You need to re-schedule your appointment should you arrive 10 or more minutes late.  We strive to give you quality time with our providers, and arriving late affects you and other patients whose appointments are after yours.  Also, if you no show three or more times for appointments you may be dismissed from the clinic at the providers discretion.     Again, thank you for choosing [facility].  Our hope is that these requests will decrease the amount of time that you wait before being seen by our physicians.       _____________________________________________________________  Should you have questions after your visit to [facility], please contact our office at [phone] between the hours of 8:30 a.m. and 4:30 p.m.  Voicemails left after 4:30 p.m. will not be returned until the following business day.  For prescription refill requests, have your pharmacy contact our office.       Resources For Cancer Patients and their Caregivers ? American Cancer Society: Can assist with transportation, wigs, general needs, runs Look Good Feel Better.        575-042-0449 ? Cancer Care: Provides financial assistance, online support groups, medication/co-pay assistance.  1-800-813-HOPE (819)177-1229) ? [place] Assists [place] cancer patients and their families through emotional , educational and financial support.  4170779209 ? [place] DSS Where to apply for food stamps, Medicaid and utility assistance. 959-447-0786 ? [facility]: Transportation to medical appointments. 252-629-4675 ? Social Security Administration: May apply for disability if have a Stage IV cancer. 707 383 6208 225-154-2955 ? [employer], Disability and Transit Services: Assists with nutrition, care and transit needs. [place] Support Programs: @10RELATIVEDAYS @ > Cancer Support Group  2nd Tuesday of the month 1pm-2pm, Journey Room  > Creative Journey  3rd Tuesday of the month 1130am-1pm, Journey Room  > Look Good Feel Better  1st Wednesday of the month 10am-12 noon, Journey Room (Call [employer] to register [phone])

## 2017-06-26 NOTE — Progress Notes (Signed)
HEMATOLOGY/ONCOLOGY PROGRESS NOTE  Date of Service: 06/26/2017  Patient Care Team: Earney Mallet, MD as PCP - General (Family Medicine) Danie Binder, MD as Consulting Physician (Gastroenterology) Dr Aviva Signs MD (general surgery)   CHIEF COMPLAINTS:  T3N0 colon cancer   ONCOLOGIC HISTORY:   Colon cancer ([place])   10/10/2016 Initial Diagnosis    Colon cancer ([place])      10/10/2016 Surgery    Partial colectomy by Dr. Arnoldo Morale  Pathology shows  a 2.1 cm grade 2 adenocarcinoma of the ascending colon with invasion through the muscularis propria into the peri-colorectal tissues.         11/17/2016 PET scan    The hepatic lesion seen on the CT scan and MRI does not demonstrate hypermetabolism and is likely a benign entity.  Right maxillary sinus disease with associated hypermetabolism.  Scattered pulmonary nodules, likely benign. No follow-up needed if patient is low-risk (and has no known or suspected primary neoplasm). Non-contrast chest CT can be considered in 12 months if patient is high-risk.       12/25/2016 - 02/04/2017 Adjuvant Chemotherapy    Xeloda 2000mg  PO BID take for 14 days out of 21 days. Plan for total of 24 weeks of treatment.       HISTORY OF PRESENTING ILLNESS:  Wanda Curry is a 71 y.o. female who returns for follow up of colon cancer. Pathology results of the colon flexure mass were consistent with adenocarcinoma. CT of the abdomen and pelvis on 10/08/2016 showed a 3.2 cm indeterminate low attenuation mass in the central liver. Patient was seen by Dr. Arnoldo Morale and had a partial colectomy on 10/10/2016. Pathology shows  a 2.1 cm grade 2 adenocarcinoma of the ascending colon with invasion through the muscularis propria into the peri-colorectal tissues.   Patient presented with her daughter today. Since her last visit she's had blistering on her bilateral arms as well as one lesion on her left leg due to skin toxicity from xeloda. Patient has been picking  at them and they got infected. I called in a Rx for keflex x10 days for her on 04/30/17. Patient states that since she's been taking the keflex her skin lesions have improved and are healing. She continues to take xarelto for her PE and has been tolerating it well without any major bleeding complications. She states her bowel movements are fine. She has swelling on her legs. She denies any chest pain, shortness of breath, appetite changes, abdominal pain, focal weakness.   Patient is off all chemotherapy at present time Here for further follow-up feeling somewhat better.  Rash is improved.  Patient had a repeat CT scan which shows slight increase in liver lesion.  Also some increased by few millimeters in chest nodule.  Previously PET scan did not show FDG avid liver lesion or lung nodule.  CEA in September was 4.9 which is somewhat rising.  MEDICAL HISTORY:  Past Medical History:  Diagnosis Date  . Anemia   . Arthritis    RA  . Cancer ([place])    skin cancer  . Depression   . Diabetes mellitus without complication ([place])   . Fibromyalgia   . GERD (gastroesophageal reflux disease)   . Heart murmur    ECHO scheduled 05-25-2015  . Hyperlipidemia   . Neuropathy   . PONV (postoperative nausea and vomiting)     SURGICAL HISTORY: Past Surgical History:  Procedure Laterality Date  . ABDOMINAL HYSTERECTOMY    . CHOLECYSTECTOMY    . planter  warts Bilateral    both feet  . skin grafts     due to planter warts    SOCIAL HISTORY: Social History   Socioeconomic History  . Marital status: Widowed    Spouse name: Not on file  . Number of children: Not on file  . Years of education: Not on file  . Highest education level: Not on file  Social Needs  . Financial resource strain: Not on file  . Food insecurity - worry: Not on file  . Food insecurity - inability: Not on file  . Transportation needs - medical: Not on file  . Transportation needs - non-medical: Not on file  Occupational History   . Not on file  Tobacco Use  . Smoking status: Former Smoker    Packs/day: 1.00    Years: 40.00    Pack years: 40.00    Types: Cigarettes    Last attempt to quit: 08/18/2004    Years since quitting: 12.8  . Smokeless tobacco: Never Used  Substance and Sexual Activity  . Alcohol use: No  . Drug use: No  . Sexual activity: Not Currently    Birth control/protection: Surgical  Other Topics Concern  . Not on file  Social History Narrative  . Not on file    FAMILY HISTORY: Family History  Problem Relation Age of Onset  . Colon cancer Neg Hx     ALLERGIES:  is allergic to adhesive [tape]; sulfa antibiotics; and erythromycin.  MEDICATIONS:  Current Outpatient Medications  Medication Sig Dispense Refill  . acetaminophen ([employer] PAIN RELIEF EXTRA STRENGTH) 500 MG tablet Take 500 mg by mouth every 6 (six) hours as needed (for pain.).    Marland Kitchen aspirin EC 325 MG tablet Take 325 mg by mouth daily.    Marland Kitchen atorvastatin (LIPITOR) 20 MG tablet Take 20 mg by mouth daily.    . Calcium Carb-Cholecalciferol (CALCIUM + D3) 600-200 MG-UNIT TABS Take 1 tablet by mouth 2 (two) times daily.  0  . capecitabine (XELODA) 500 MG tablet Take 4 tablets (2,000 mg total) by mouth 2 (two) times daily after a meal. Take on days 1-14. Repeat every 21 days. (Patient not taking: Reported on 05/11/2017) 112 tablet 5  . carvedilol (COREG) 6.25 MG tablet Take 6.25 mg by mouth 2 (two) times daily with a meal.    . folic acid (FOLVITE) 1 MG tablet Take 1 mg by mouth daily.    Marland Kitchen gabapentin (NEURONTIN) 600 MG tablet Take 600 mg by mouth 3 (three) times daily.    Marland Kitchen GLUCOSAMINE-CHONDROITIN PO Take 1 tablet by mouth 2 (two) times daily.    . hydrochlorothiazide (HYDRODIURIL) 25 MG tablet Take 25 mg by mouth daily.    . Insulin Glargine (BASAGLAR KWIKPEN [place]) Inject 42 Units into the skin every morning.    . magnesium oxide (MAG-OX) 400 (241.3 Mg) MG tablet Take 1 tablet (400 mg total) by mouth daily. (Patient not taking: Reported on  05/11/2017) 60 tablet 0  . meclizine (ANTIVERT) 32 MG tablet Take 1 tablet (32 mg total) by mouth 3 (three) times daily as needed for dizziness. (Patient not taking: Reported on 05/11/2017) 30 tablet 0  . methotrexate (RHEUMATREX) 2.5 MG tablet Take 20 mg by mouth every Sunday. Caution:Chemotherapy. Protect from light.     . Milnacipran HCl (SAVELLA) 100 MG TABS tablet Take 100 mg by mouth 2 (two) times daily.    . Multiple Vitamins-Minerals (CENTRUM SILVER PO) Take 1 tablet by mouth daily.    Marland Kitchen  NOVOLOG MIX 70/30 FLEXPEN (70-30) 100 UNIT/ML FlexPen INJECT 30 UNITS SUBCUTANEOUSLY TWICE DAILY AT BREAKFAST AND DINNER  3  . omeprazole (PRILOSEC) 40 MG capsule TAKE ONE CAPSULE BY MOUTH ONCE A DAY  3  . ondansetron (ZOFRAN) 8 MG tablet Take 1 tablet (8 mg total) by mouth every 8 (eight) hours as needed for nausea or vomiting. (Patient not taking: Reported on 05/11/2017) 80 tablet 1  . rivaroxaban (XARELTO) 20 MG TABS tablet Take 1 tablet (20 mg total) by mouth daily with supper. 90 tablet 0  . rOPINIRole (REQUIP) 1 MG tablet Take 1 mg by mouth 3 (three) times daily.     No current facility-administered medications for this visit.    Review of Systems  Constitutional: Negative for malaise/fatigue.       No loss of appetite   HENT: Negative.   Eyes: Negative.   Respiratory: Negative for shortness of breath.   Cardiovascular: Positive for leg swelling. Negative for chest pain and palpitations.  Gastrointestinal: Negative.  Negative for abdominal pain, blood in stool, diarrhea and melena.  Genitourinary: Negative.   Musculoskeletal: Positive for joint pain (RA) and myalgias (fibromyalgia).  Skin: Negative.        Skin lesions on her arms and one on her left leg  Neurological: Negative for dizziness.  Endo/Heme/Allergies: Negative.   Psychiatric/Behavioral: Negative.  The patient is not nervous/anxious.   All other systems reviewed and are negative. 14 point review of systems was performed and is  negative except as detailed under history of present illness and above  PHYSICAL EXAMINATION: ECOG PERFORMANCE STATUS: 1 - Symptomatic but completely ambulatory  There were no vitals filed for this visit. There were no vitals filed for this visit. .There is no height or weight on file to calculate BMI.   Physical Exam  Constitutional: She is oriented to person, place, and time and well-developed, well-nourished, and in no distress.  HENT:  Head: Normocephalic and atraumatic.  Eyes: EOM are normal. Pupils are equal, round, and reactive to light.  Neck: Normal range of motion. Neck supple.  Cardiovascular: Normal rate, regular rhythm and normal heart sounds.  Pulmonary/Chest: Effort normal and breath sounds normal.  Abdominal: Soft. Bowel sounds are normal.  Musculoskeletal: Normal range of motion. She exhibits edema.  Neurological: She is alert and oriented to person, place, and time. Gait normal.  Skin: Skin is warm and dry. No rash noted. No erythema.  Multiple skin lesions on bilateral arms, healing. Left leg lesion 1.5 cm is healing as well.  Psychiatric: Mood, memory, affect and judgment normal.  Nursing note and vitals reviewed.  LABORATORY DATA:  I have reviewed the data as listed  CBC Latest Ref Rng & Units 05/11/2017 02/19/2017 01/28/2017  WBC 4.0 - 10.5 K/uL 9.3 11.8(H) 9.9  Hemoglobin 12.0 - 15.0 g/dL 10.5(L) 11.1(L) 10.5(L)  Hematocrit 36.0 - 46.0 % 32.2(L) 34.3(L) 31.3(L)  Platelets 150 - 400 K/uL 282 191 129(L)   CMP Latest Ref Rng & Units 05/11/2017 02/19/2017 01/28/2017  Glucose 65 - 99 mg/dL 125(H) 291(H) 218(H)  BUN 6 - 20 mg/dL 14 15 21(H)  Creatinine 0.44 - 1.00 mg/dL 0.94 1.16(H) 1.01(H)  Sodium 135 - 145 mmol/L 137 137 138  Potassium 3.5 - 5.1 mmol/L 3.1(L) 4.1 3.7  Chloride 101 - 111 mmol/L 96(L) 100(L) 98(L)  CO2 22 - 32 mmol/L 32 27 28  Calcium 8.9 - 10.3 mg/dL 9.3 8.9 8.6(L)  Total Protein 6.5 - 8.1 g/dL 6.8 6.7 6.1(L)  Total Bilirubin  0.3 - 1.2 mg/dL  0.2(L) 0.6 1.1  Alkaline Phos 38 - 126 U/L 50 59 49  AST 15 - 41 U/L 30 28 27   ALT 14 - 54 U/L 26 25 27    PATHOLOGY:      Colonoscopy 09/23/2016 - One 25 mm, recently bleeding polyp in the proximal ascending colon, NOT REMOVED. - Heme positive stools/hematichezia due to malignant tumor at the hepatic flexure AND COLON POLYPS. Biopsied. - Diverticulosis in the sigmoid colon and in the descending colon.  RADIOGRAPHIC STUDIES: I have personally reviewed the radiological images as listed and agreed with the findings in the report.    ASSESSMENT & PLAN:  Cancer Staging Colon cancer [facility]) Staging form: Colon and Rectum, AJCC 8th Edition - Clinical: Stage IIA (cT3, cN0, cM0) - Signed by [name], MD on 11/26/2016   71year-old very pleasant lady with I reviewed CT scan with the patient and compared with the previous CT scan there is definite somewhat growth noticed in the liver lesion as well as a lung lesion. At this point time is not enough for [place] to proceed with any biopsy evaluation but will repeat another PET scan in a few months and if any of those lesions light up then biopsy may be needed we will also repeat CEA if CEA continues to rise is definitely would suggest recurrent tumor.  I explained all this to the patient . She has been accompanied with her daughter and demonstrated full understanding.  1) Stage IIA colon adenocarcinoma.  She has stage II high risk disease in the presence of lymphovascular invasion and high grade (poorly differentiated) tumor. Patient has opted for adjuvant treatment with Xeloda 1000 mg/m2 PO qBID 1-14 every 3 weeks x 24 weeks. She started taking her Xeloda on 12/25/16. Patient took xeloda up to cycle 2 day 13, however discontinued the chemo due to intolerable side effects including debilitating fatigue, hand-foot syndrome, mucositis, and diarrhea.  Clinically NED. Repeat CT C/A/P with contrast scheduled for 06/23/17. She will need a repeat  colonoscopy in September 2018 at the 6 month mark.  NCCN guidelines for surveillance for Colon cancer are as follows (1.2017): A. Stage I 1. Colonoscopy at year 1 A. If advanced adenoma, repeat in 1 year B. If no advanced adenoma, repeat in 3 years, and then every 5 years.  B. Stage II, Stage III 1. H+P every 3-6 months x 2 years and then every 6 months for a total of 5 years  2. CEA every 3-6 months x 2 years and then every 6 months for a total of 5 years  3. CT CAP every 6-12 months (category 2B for frequency < 12 months) for a total of 5 years . 4.  Colonoscopy in 1 year except if no preoperative colonoscopy due to obstructing lesion, colonoscopy in 3-6 months.  A. If advanced adenoma, repeat in 1 year B. If no advanced adenoma, repeat in 3 years, then every 5 years 5. PET/CT scan is not recommended.   C. Stage IV 1. H+P every 3-6 months x 2 years and then every 6 months for a total of 5 years  2. CEA every 3 months x 2 years and then every 6 months for a total of 3- 5 years  3. CT CAP every 3-6 months (category 2B for frequency < 6 months) x 2 years., then every 6-12 months for a total of 5 years . 4. Colonoscopy in 1 year except if no preoperative colonoscopy due to obstructing lesion, colonoscopy  in 3-6 months.  A. If advanced adenoma, repeat in 1 year B. If no advanced adenoma, repeat in 3 years, then every 5 years   2)Skin lesions due to skin toxicity from xeloda -continue keflex. -counseled patient to not pick at her skin lesions since this will cause them to get infected, she verbalized understanding.  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 -Will recheck her CTA chest at the 6 month mark which will be February 2019. Continue xarelto. -Will do a full hypercoagulability workup once she is off anticoagulation since xarelto can skew some of the results on the hypercoagulability workup.    LABS:  CBC, CMP, CEA  prior to next visit. DISPO: RTC in 4 months for follow up.  All of the patients questions were answered with apparent satisfaction. The patient knows to call the clinic with any problems, questions or concerns.    This note was electronically signed.  [name], MD 06/26/2017 1:23 PM

## 2017-06-27 LAB — CEA: CEA: 3.8 ng/mL (ref 0.0–4.7)

## 2017-06-29 ENCOUNTER — Other Ambulatory Visit (HOSPITAL_COMMUNITY): Payer: Medicare Other

## 2017-06-29 ENCOUNTER — Ambulatory Visit (HOSPITAL_COMMUNITY): Payer: Medicare Other

## 2017-07-14 ENCOUNTER — Encounter (HOSPITAL_COMMUNITY): Payer: Self-pay | Admitting: *Deleted

## 2017-07-14 ENCOUNTER — Emergency Department (HOSPITAL_COMMUNITY)
Admission: EM | Admit: 2017-07-14 | Discharge: 2017-07-15 | Disposition: A | Discharge status: Home / Self Care | Payer: Medicare Other | Attending: Emergency Medicine | Admitting: Emergency Medicine

## 2017-07-14 ENCOUNTER — Emergency Department (HOSPITAL_COMMUNITY): Payer: Medicare Other

## 2017-07-14 DIAGNOSIS — R55 Syncope and collapse: Secondary | ICD-10-CM | POA: Diagnosis not present

## 2017-07-14 DIAGNOSIS — E119 Type 2 diabetes mellitus without complications: Secondary | ICD-10-CM | POA: Insufficient documentation

## 2017-07-14 DIAGNOSIS — Z85828 Personal history of other malignant neoplasm of skin: Secondary | ICD-10-CM | POA: Insufficient documentation

## 2017-07-14 DIAGNOSIS — Z96651 Presence of right artificial knee joint: Secondary | ICD-10-CM | POA: Diagnosis not present

## 2017-07-14 DIAGNOSIS — R197 Diarrhea, unspecified: Secondary | ICD-10-CM | POA: Diagnosis not present

## 2017-07-14 DIAGNOSIS — Z79899 Other long term (current) drug therapy: Secondary | ICD-10-CM | POA: Diagnosis not present

## 2017-07-14 DIAGNOSIS — Z87891 Personal history of nicotine dependence: Secondary | ICD-10-CM | POA: Diagnosis not present

## 2017-07-14 DIAGNOSIS — D010 Carcinoma in situ of colon: Secondary | ICD-10-CM | POA: Insufficient documentation

## 2017-07-14 DIAGNOSIS — Z7982 Long term (current) use of aspirin: Secondary | ICD-10-CM | POA: Diagnosis not present

## 2017-07-14 DIAGNOSIS — W19XXXA Unspecified fall, initial encounter: Secondary | ICD-10-CM

## 2017-07-14 LAB — CBC WITH DIFFERENTIAL/PLATELET
Basophils Absolute: 0 10*3/uL (ref 0.0–0.1)
Basophils Relative: 0 %
Eosinophils Absolute: 0.2 10*3/uL (ref 0.0–0.7)
Eosinophils Relative: 2 %
HCT: 34 % — ABNORMAL LOW (ref 36.0–46.0)
Hemoglobin: 10.4 g/dL — ABNORMAL LOW (ref 12.0–15.0)
Lymphocytes Relative: 18 %
Lymphs Abs: 1.8 10*3/uL (ref 0.7–4.0)
MCH: 26.2 pg (ref 26.0–34.0)
MCHC: 30.6 g/dL (ref 30.0–36.0)
MCV: 85.6 fL (ref 78.0–100.0)
Monocytes Absolute: 0.3 10*3/uL (ref 0.1–1.0)
Monocytes Relative: 3 %
Neutro Abs: 7.9 10*3/uL — ABNORMAL HIGH (ref 1.7–7.7)
Neutrophils Relative %: 77 %
Platelets: 292 10*3/uL (ref 150–400)
RBC: 3.97 MIL/uL (ref 3.87–5.11)
RDW: 18.6 % — ABNORMAL HIGH (ref 11.5–15.5)
WBC: 10.2 10*3/uL (ref 4.0–10.5)

## 2017-07-14 LAB — COMPREHENSIVE METABOLIC PANEL
ALT: 29 U/L (ref 14–54)
AST: 35 U/L (ref 15–41)
Albumin: 3.5 g/dL (ref 3.5–5.0)
Alkaline Phosphatase: 55 U/L (ref 38–126)
Anion gap: 10 (ref 5–15)
BUN: 18 mg/dL (ref 6–20)
CO2: 22 mmol/L (ref 22–32)
Calcium: 9.6 mg/dL (ref 8.9–10.3)
Chloride: 100 mmol/L — ABNORMAL LOW (ref 101–111)
Creatinine, Ser: 1.12 mg/dL — ABNORMAL HIGH (ref 0.44–1.00)
GFR calc Af Amer: 55 mL/min — ABNORMAL LOW (ref >60)
GFR calc non Af Amer: 48 mL/min — ABNORMAL LOW (ref >60)
Glucose, Bld: 295 mg/dL — ABNORMAL HIGH (ref 65–99)
Potassium: 3.4 mmol/L — ABNORMAL LOW (ref 3.5–5.1)
Sodium: 132 mmol/L — ABNORMAL LOW (ref 135–145)
Total Bilirubin: 0.7 mg/dL (ref 0.3–1.2)
Total Protein: 7.1 g/dL (ref 6.5–8.1)

## 2017-07-14 LAB — CBG MONITORING, ED: Glucose-Capillary: 236 mg/dL — ABNORMAL HIGH (ref 65–99)

## 2017-07-14 MED ORDER — ONDANSETRON HCL 4 MG/2ML IJ SOLN
4.0000 mg | Freq: Once | INTRAMUSCULAR | Status: AC
  Administered 2017-07-14: 4 mg via INTRAVENOUS
  Filled 2017-07-14: qty 2

## 2017-07-14 MED ORDER — SODIUM CHLORIDE 0.9 % IV BOLUS (SEPSIS)
1000.0000 mL | Freq: Once | INTRAVENOUS | Status: AC
  Administered 2017-07-14: 1000 mL via INTRAVENOUS

## 2017-07-14 MED ORDER — SODIUM CHLORIDE 0.9 % IV BOLUS (SEPSIS)
1000.0000 mL | Freq: Once | INTRAVENOUS | Status: AC
  Administered 2017-07-15: 1000 mL via INTRAVENOUS

## 2017-07-14 NOTE — ED Triage Notes (Signed)
Pt passed out in kitchen , fell and cut to scalp.  Pt on blood thinner Xaltero.  Per family member pt more confused than usual.  Pt states she passed out x 2 today.  Pt with recent diarrhea for 2 days.

## 2017-07-14 NOTE — Discharge Instructions (Signed)
Tests showed no life-threatening condition.  Increase fluids.  Rest.  Follow-up your primary care doctor.

## 2017-07-14 NOTE — ED Provider Notes (Signed)
[facility] EMERGENCY DEPARTMENT Provider Note   CSN: [id number] Arrival date & time: 07/14/17  1757     History   Chief Complaint Chief Complaint  Patient presents with  . Fall    HPI Wanda Curry is a 71 y.o. female.  Patient reports multiple episodes of diarrhea in the past 2 days.  Additionally, she has not been eating and feels weak.  She allegedly passed out twice today hitting the back of her head.  She is on Xarelto.  Daughter reports some confusion, but this is not evident to me.  No chest pain, dyspnea, neurological deficits, fever, sweats, chills, dysuria.  Severity of symptoms is moderate.  Nothing makes symptoms better or worse.      Past Medical History:  Diagnosis Date  . Anemia   . Arthritis    RA  . Cancer ([place])    skin cancer  . Depression   . Diabetes mellitus without complication ([place])   . Fibromyalgia   . GERD (gastroesophageal reflux disease)   . Heart murmur    ECHO scheduled 05-25-2015  . Hyperlipidemia   . Neuropathy   . PONV (postoperative nausea and vomiting)     Patient Active Problem List   Diagnosis Date Noted  . Acute pulmonary embolism ([place]) 04/15/2017  . Malignant neoplasm of colon ([place]) 11/26/2016  . Colon cancer ([place]) 10/10/2016  . Heme + stool 09/10/2016  . Constipation 09/10/2016  . DJD (degenerative joint disease) of knee 05/30/2015    Past Surgical History:  Procedure Laterality Date  . ABDOMINAL HYSTERECTOMY    . BIOPSY  09/23/2016   Procedure: BIOPSY;  Surgeon: Danie Binder, MD;  [facility];  Service: Endoscopy;;  hepatic flexure mass  . CHOLECYSTECTOMY    . COLONOSCOPY WITH PROPOFOL N/A 09/23/2016   Procedure: COLONOSCOPY WITH PROPOFOL;  Surgeon: Danie Binder, MD;  Location: [facility];  Service: Endoscopy;  Laterality: N/A;  7:30 am  . HARDWARE REMOVAL Right 05/30/2015   Procedure: HARDWARE REMOVAL;  Surgeon: Ninetta Lights, MD;  Location: [place];  Service: Orthopedics;  Laterality: Right;  .  PARTIAL COLECTOMY N/A 10/10/2016   Procedure: PARTIAL COLECTOMY;  Surgeon: Aviva Signs, MD;  Location: [facility] ORS;  Service: General;  Laterality: N/A;  . planter warts Bilateral    both feet  . POLYPECTOMY  09/23/2016   Procedure: POLYPECTOMY;  Surgeon: Danie Binder, MD;  Location[facility];  Service: Endoscopy;;  transverse colon polyps times 2, rectal polyp  . skin grafts     due to planter warts  . TOTAL KNEE ARTHROPLASTY Right 05/30/2015   Procedure: RIGHT TOTAL KNEE ARTHROPLASTY;  Surgeon: Ninetta Lights, MD;  Location: [facility];  Service: Orthopedics;  Laterality: Right;    OB History    No data available       Home Medications    Prior to Admission medications   Medication Sig Start Date End Date Taking? Authorizing Provider  acetaminophen ([employer] PAIN RELIEF EXTRA STRENGTH) 500 MG tablet Take 500 mg by mouth every 6 (six) hours as needed (for pain.).    [provider]  aspirin EC 325 MG tablet Take 325 mg by mouth daily.    [provider]  atorvastatin (LIPITOR) 20 MG tablet Take 20 mg by mouth daily.    [provider]  Calcium Carb-Cholecalciferol (CALCIUM + D3) 600-200 MG-UNIT TABS Take 1 tablet by mouth 2 (two) times daily. 07/16/16   [provider]  carvedilol (  COREG) 6.25 MG tablet Take 6.25 mg by mouth 2 (two) times daily with a meal.    [provider]  folic acid (FOLVITE) 1 MG tablet Take 1 mg by mouth daily.    [provider]  furosemide (LASIX) 40 MG tablet Take 40 mg daily by mouth.    [provider]  gabapentin (NEURONTIN) 600 MG tablet Take 600 mg by mouth 3 (three) times daily.    [provider]  GLUCOSAMINE-CHONDROITIN PO Take 1 tablet by mouth 2 (two) times daily.    [provider]  Insulin Glargine (BASAGLAR KWIKPEN [place]) Inject 42 Units into the skin every morning.    [provider]  magnesium gluconate (MAGONATE) 30 MG tablet Take 30 mg as needed by mouth.     [provider]  methotrexate (RHEUMATREX) 2.5 MG tablet Take 20 mg by mouth every Sunday. Caution:Chemotherapy. Protect from light.     [provider]  Milnacipran HCl (SAVELLA) 100 MG TABS tablet Take 100 mg by mouth 2 (two) times daily.    [provider]  Multiple Vitamins-Minerals (CENTRUM SILVER PO) Take 1 tablet by mouth daily.    [provider]  NOVOLOG MIX 70/30 FLEXPEN (70-30) 100 UNIT/ML FlexPen INJECT 30 UNITS SUBCUTANEOUSLY TWICE DAILY AT [facility] AND DINNER 09/14/16   [provider]  omeprazole (PRILOSEC) 40 MG capsule TAKE ONE CAPSULE BY MOUTH ONCE A DAY 11/17/16   [provider]  rivaroxaban (XARELTO) 20 MG TABS tablet Take 1 tablet (20 mg total) by mouth daily with supper. 06/09/17   Holley Bouche, NP  rOPINIRole (REQUIP) 1 MG tablet Take 1 mg by mouth 3 (three) times daily.    [provider]    Family History Family History  Problem Relation Age of Onset  . Colon cancer Neg Hx     Social History Social History   Tobacco Use  . Smoking status: Former Smoker    Packs/day: 1.00    Years: 40.00    Pack years: 40.00    Types: Cigarettes    Last attempt to quit: 08/18/2004    Years since quitting: 12.9  . Smokeless tobacco: Never Used  Substance Use Topics  . Alcohol use: No  . Drug use: No     Allergies   Adhesive [tape]; Sulfa antibiotics; and Erythromycin   Review of Systems Review of Systems  All other systems reviewed and are negative.    Physical Exam Updated Vital Signs BP (!) 130/106   Pulse 72   Temp 98.4 F (36.9 C) (Oral)   Resp 15   Ht 5' 5.5" (1.664 m)   Wt 108.9 kg (240 lb)   SpO2 98%   BMI 39.33 kg/m   Physical Exam  Constitutional: She is oriented to person, place, and time. She appears well-developed and well-nourished.  HENT:  Head: Normocephalic.  3 mm laceration on occipital area  Eyes: Conjunctivae are normal.  Neck: Neck supple.  Cardiovascular: Normal  rate and regular rhythm.  Pulmonary/Chest: Effort normal and breath sounds normal.  Abdominal: Soft. Bowel sounds are normal.  Musculoskeletal: Normal range of motion.  Neurological: She is alert and oriented to person, place, and time.  Skin: Skin is warm and dry.  Psychiatric: She has a normal mood and affect. Her behavior is normal.  Nursing note and vitals reviewed.    ED Treatments / Results  Labs (all labs ordered are listed, but only abnormal results are displayed) Labs Reviewed  CBC WITH DIFFERENTIAL/PLATELET -  Abnormal; Notable for the following components:      Result Value   Hemoglobin 10.4 (*)    HCT 34.0 (*)    RDW 18.6 (*)    Neutro Abs 7.9 (*)    All other components within normal limits  COMPREHENSIVE METABOLIC PANEL - Abnormal; Notable for the following components:   Sodium 132 (*)    Potassium 3.4 (*)    Chloride 100 (*)    Glucose, Bld 295 (*)    Creatinine, Ser 1.12 (*)    GFR calc non Af Amer 48 (*)    GFR calc Af Amer 55 (*)    All other components within normal limits  CBG MONITORING, ED - Abnormal; Notable for the following components:   Glucose-Capillary 236 (*)    All other components within normal limits  URINALYSIS, ROUTINE W REFLEX MICROSCOPIC    EKG  EKG Interpretation None       Radiology Ct Head Wo Contrast  Result Date: 07/14/2017 CLINICAL DATA:  Syncope, struck occipital area. Passed out in kitchen. On anticoagulation. EXAM: CT HEAD WITHOUT CONTRAST TECHNIQUE: Contiguous axial images were obtained from the base of the skull through the vertex without intravenous contrast. COMPARISON:  None. FINDINGS: Brain: Generalized cerebral and mild cerebellar atrophy. Mild chronic small vessel ischemia. No intracranial hemorrhage, mass effect, or midline shift. No hydrocephalus. The basilar cisterns are patent. No evidence of territorial infarct or acute ischemia. No extra-axial or intracranial fluid collection. Vascular: Atherosclerosis of  skullbase vasculature without hyperdense vessel or abnormal calcification. Skull: No fracture or focal lesion. Sinuses/Orbits: Chronic right maxillary sinus disease. No acute findings. Other: None. IMPRESSION: No acute intracranial abnormality. Normal for age atrophy and chronic small vessel ischemia. Electronically Signed   By: Jeb Levering M.D.   On: 07/14/2017 23:09    Procedures Procedures (including critical care time)  Medications Ordered in ED Medications  sodium chloride 0.9 % bolus 1,000 mL (not administered)  sodium chloride 0.9 % bolus 1,000 mL (0 mLs Intravenous Stopped 07/14/17 2337)  ondansetron (ZOFRAN) injection 4 mg (4 mg Intravenous Given 07/14/17 2231)     Initial Impression / Assessment and Plan / ED Course  I have reviewed the triage vital signs and the nursing notes.  Pertinent labs & imaging results that were available during my care of the patient were reviewed by me and considered in my medical decision making (see chart for details).     Patient is nontoxic-appearing.  I suspect her syncope was related to dehydration from the diarrhea.  Glucose elevated, but otherwise labs were acceptable.  CT head negative.  Patient was given 2 L of IV fluid and feels much better.  I see no evidence of confusion or disorientation  Final Clinical Impressions(s) / ED Diagnoses   Final diagnoses:  Fall, initial encounter  Diarrhea, unspecified type  Syncope, unspecified syncope type    ED Discharge Orders    None       Nat Christen, MD 07/15/17 0002

## 2017-07-14 NOTE — ED Notes (Signed)
Pt states she passed out x2 today. Pt hit the back of her head & is on blood thinners.

## 2017-07-15 LAB — URINALYSIS, ROUTINE W REFLEX MICROSCOPIC
Bilirubin Urine: NEGATIVE
Glucose, UA: 50 mg/dL — AB
Hgb urine dipstick: NEGATIVE
Ketones, ur: NEGATIVE mg/dL
Nitrite: NEGATIVE
Protein, ur: NEGATIVE mg/dL
Specific Gravity, Urine: 1.011 (ref 1.005–1.030)
Trans Epithel, UA: 1
pH: 5 (ref 5.0–8.0)

## 2017-07-15 NOTE — ED Notes (Signed)
Pt alert & oriented x4, stable gait. Patient  given discharge instructions, paperwork & prescription(s). Patient verbalized understanding. Pt left department w/ no further questions. 

## 2017-08-12 ENCOUNTER — Encounter (HOSPITAL_COMMUNITY): Payer: Self-pay | Admitting: *Deleted

## 2017-08-12 ENCOUNTER — Observation Stay (HOSPITAL_COMMUNITY)
Admission: EM | Admit: 2017-08-12 | Discharge: 2017-08-14 | Disposition: A | Discharge status: Home / Self Care | Payer: Medicare Other | Attending: Family Medicine | Admitting: Family Medicine

## 2017-08-12 ENCOUNTER — Other Ambulatory Visit: Payer: Self-pay

## 2017-08-12 DIAGNOSIS — R911 Solitary pulmonary nodule: Secondary | ICD-10-CM | POA: Diagnosis not present

## 2017-08-12 DIAGNOSIS — E1140 Type 2 diabetes mellitus with diabetic neuropathy, unspecified: Secondary | ICD-10-CM | POA: Insufficient documentation

## 2017-08-12 DIAGNOSIS — K920 Hematemesis: Secondary | ICD-10-CM | POA: Diagnosis not present

## 2017-08-12 DIAGNOSIS — K5730 Diverticulosis of large intestine without perforation or abscess without bleeding: Secondary | ICD-10-CM | POA: Insufficient documentation

## 2017-08-12 DIAGNOSIS — K5791 Diverticulosis of intestine, part unspecified, without perforation or abscess with bleeding: Secondary | ICD-10-CM | POA: Diagnosis not present

## 2017-08-12 DIAGNOSIS — Z86711 Personal history of pulmonary embolism: Secondary | ICD-10-CM

## 2017-08-12 DIAGNOSIS — G2581 Restless legs syndrome: Secondary | ICD-10-CM

## 2017-08-12 DIAGNOSIS — K921 Melena: Secondary | ICD-10-CM | POA: Diagnosis not present

## 2017-08-12 DIAGNOSIS — Z96651 Presence of right artificial knee joint: Secondary | ICD-10-CM | POA: Diagnosis not present

## 2017-08-12 DIAGNOSIS — Z7901 Long term (current) use of anticoagulants: Secondary | ICD-10-CM | POA: Insufficient documentation

## 2017-08-12 DIAGNOSIS — E785 Hyperlipidemia, unspecified: Secondary | ICD-10-CM | POA: Diagnosis not present

## 2017-08-12 DIAGNOSIS — K219 Gastro-esophageal reflux disease without esophagitis: Secondary | ICD-10-CM | POA: Diagnosis not present

## 2017-08-12 DIAGNOSIS — I10 Essential (primary) hypertension: Secondary | ICD-10-CM

## 2017-08-12 DIAGNOSIS — I2782 Chronic pulmonary embolism: Secondary | ICD-10-CM | POA: Insufficient documentation

## 2017-08-12 DIAGNOSIS — Z85038 Personal history of other malignant neoplasm of large intestine: Secondary | ICD-10-CM | POA: Insufficient documentation

## 2017-08-12 DIAGNOSIS — C189 Malignant neoplasm of colon, unspecified: Secondary | ICD-10-CM

## 2017-08-12 DIAGNOSIS — Z79899 Other long term (current) drug therapy: Secondary | ICD-10-CM | POA: Diagnosis not present

## 2017-08-12 DIAGNOSIS — M069 Rheumatoid arthritis, unspecified: Secondary | ICD-10-CM | POA: Diagnosis not present

## 2017-08-12 DIAGNOSIS — M797 Fibromyalgia: Secondary | ICD-10-CM | POA: Diagnosis not present

## 2017-08-12 DIAGNOSIS — K922 Gastrointestinal hemorrhage, unspecified: Secondary | ICD-10-CM

## 2017-08-12 DIAGNOSIS — Z794 Long term (current) use of insulin: Secondary | ICD-10-CM | POA: Diagnosis not present

## 2017-08-12 DIAGNOSIS — I2699 Other pulmonary embolism without acute cor pulmonale: Secondary | ICD-10-CM

## 2017-08-12 DIAGNOSIS — Z9049 Acquired absence of other specified parts of digestive tract: Secondary | ICD-10-CM | POA: Insufficient documentation

## 2017-08-12 DIAGNOSIS — E119 Type 2 diabetes mellitus without complications: Secondary | ICD-10-CM

## 2017-08-12 DIAGNOSIS — K625 Hemorrhage of anus and rectum: Secondary | ICD-10-CM | POA: Diagnosis present

## 2017-08-12 DIAGNOSIS — D649 Anemia, unspecified: Secondary | ICD-10-CM

## 2017-08-12 HISTORY — DX: Gastrointestinal hemorrhage, unspecified: K92.2

## 2017-08-12 LAB — CBC WITH DIFFERENTIAL/PLATELET
Basophils Absolute: 0 10*3/uL (ref 0.0–0.1)
Basophils Relative: 0 %
Eosinophils Absolute: 0.3 10*3/uL (ref 0.0–0.7)
Eosinophils Relative: 4 %
HCT: 32.2 % — ABNORMAL LOW (ref 36.0–46.0)
Hemoglobin: 9.9 g/dL — ABNORMAL LOW (ref 12.0–15.0)
Lymphocytes Relative: 23 %
Lymphs Abs: 1.9 10*3/uL (ref 0.7–4.0)
MCH: 25.5 pg — ABNORMAL LOW (ref 26.0–34.0)
MCHC: 30.7 g/dL (ref 30.0–36.0)
MCV: 83 fL (ref 78.0–100.0)
Monocytes Absolute: 0.2 10*3/uL (ref 0.1–1.0)
Monocytes Relative: 3 %
Neutro Abs: 5.9 10*3/uL (ref 1.7–7.7)
Neutrophils Relative %: 70 %
Platelets: 304 10*3/uL (ref 150–400)
RBC: 3.88 MIL/uL (ref 3.87–5.11)
RDW: 18.8 % — ABNORMAL HIGH (ref 11.5–15.5)
WBC: 8.4 10*3/uL (ref 4.0–10.5)

## 2017-08-12 LAB — COMPREHENSIVE METABOLIC PANEL
ALT: 27 U/L (ref 14–54)
AST: 28 U/L (ref 15–41)
Albumin: 3.4 g/dL — ABNORMAL LOW (ref 3.5–5.0)
Alkaline Phosphatase: 63 U/L (ref 38–126)
Anion gap: 10 (ref 5–15)
BUN: 21 mg/dL — ABNORMAL HIGH (ref 6–20)
CO2: 27 mmol/L (ref 22–32)
Calcium: 9.2 mg/dL (ref 8.9–10.3)
Chloride: 100 mmol/L — ABNORMAL LOW (ref 101–111)
Creatinine, Ser: 1.15 mg/dL — ABNORMAL HIGH (ref 0.44–1.00)
GFR calc Af Amer: 53 mL/min — ABNORMAL LOW (ref >60)
GFR calc non Af Amer: 46 mL/min — ABNORMAL LOW (ref >60)
Glucose, Bld: 302 mg/dL — ABNORMAL HIGH (ref 65–99)
Potassium: 3.9 mmol/L (ref 3.5–5.1)
Sodium: 137 mmol/L (ref 135–145)
Total Bilirubin: 0.5 mg/dL (ref 0.3–1.2)
Total Protein: 7.2 g/dL (ref 6.5–8.1)

## 2017-08-12 LAB — URINALYSIS, ROUTINE W REFLEX MICROSCOPIC
Bilirubin Urine: NEGATIVE
Glucose, UA: >=500 mg/dL — AB
Ketones, ur: NEGATIVE mg/dL
Nitrite: NEGATIVE
Protein, ur: NEGATIVE mg/dL
Specific Gravity, Urine: 1.006 (ref 1.005–1.030)
pH: 5 (ref 5.0–8.0)

## 2017-08-12 LAB — PROTIME-INR
INR: 1.33
Prothrombin Time: 16.4 seconds — ABNORMAL HIGH (ref 11.4–15.2)

## 2017-08-12 LAB — HEMATOCRIT: HCT: 31.6 % — ABNORMAL LOW (ref 36.0–46.0)

## 2017-08-12 LAB — HEMOGLOBIN: Hemoglobin: 9.6 g/dL — ABNORMAL LOW (ref 12.0–15.0)

## 2017-08-12 LAB — TYPE AND SCREEN
ABO/RH(D): O POS
Antibody Screen: NEGATIVE

## 2017-08-12 LAB — GLUCOSE, CAPILLARY: Glucose-Capillary: 165 mg/dL — ABNORMAL HIGH (ref 65–99)

## 2017-08-12 MED ORDER — INSULIN ASPART 100 UNIT/ML ~~LOC~~ SOLN
0.0000 [IU] | Freq: Every day | SUBCUTANEOUS | Status: DC
  Administered 2017-08-13: 2 [IU] via SUBCUTANEOUS

## 2017-08-12 MED ORDER — ROPINIROLE HCL 1 MG PO TABS
1.0000 mg | ORAL_TABLET | Freq: Three times a day (TID) | ORAL | Status: DC
  Administered 2017-08-12 – 2017-08-14 (×5): 1 mg via ORAL
  Filled 2017-08-12 (×5): qty 1

## 2017-08-12 MED ORDER — GABAPENTIN 600 MG PO TABS
600.0000 mg | ORAL_TABLET | Freq: Three times a day (TID) | ORAL | Status: DC
  Administered 2017-08-12: 600 mg via ORAL
  Filled 2017-08-12 (×5): qty 1

## 2017-08-12 MED ORDER — INSULIN ASPART 100 UNIT/ML ~~LOC~~ SOLN
0.0000 [IU] | Freq: Three times a day (TID) | SUBCUTANEOUS | Status: DC
Start: 2017-08-13 — End: 2017-08-14
  Administered 2017-08-13 (×2): 2 [IU] via SUBCUTANEOUS
  Administered 2017-08-14: 3 [IU] via SUBCUTANEOUS
  Administered 2017-08-14: 2 [IU] via SUBCUTANEOUS

## 2017-08-12 MED ORDER — SODIUM CHLORIDE 0.9 % IV SOLN: INTRAVENOUS | Status: DC

## 2017-08-12 MED ORDER — SODIUM CHLORIDE 0.9 % IV SOLN
INTRAVENOUS | Status: DC
  Administered 2017-08-12 – 2017-08-14 (×5): via INTRAVENOUS

## 2017-08-12 MED ORDER — PEG 3350-KCL-NA BICARB-NACL 420 G PO SOLR
4000.0000 mL | Freq: Once | ORAL | Status: AC
  Administered 2017-08-13: 4000 mL via ORAL
  Filled 2017-08-12: qty 4000

## 2017-08-12 MED ORDER — SODIUM CHLORIDE 0.9 % IV SOLN
INTRAVENOUS | Status: DC
Start: 2017-08-12 — End: 2017-08-12
  Administered 2017-08-12: 14:00:00 via INTRAVENOUS

## 2017-08-12 MED ORDER — PANTOPRAZOLE SODIUM 40 MG PO TBEC
40.0000 mg | DELAYED_RELEASE_TABLET | Freq: Every day | ORAL | Status: DC
  Administered 2017-08-13 – 2017-08-14 (×2): 40 mg via ORAL
  Filled 2017-08-12 (×2): qty 1

## 2017-08-12 MED ORDER — CARVEDILOL 3.125 MG PO TABS
6.2500 mg | ORAL_TABLET | Freq: Two times a day (BID) | ORAL | Status: DC
Start: 2017-08-13 — End: 2017-08-14
  Administered 2017-08-13 – 2017-08-14 (×2): 6.25 mg via ORAL
  Filled 2017-08-12 (×3): qty 2

## 2017-08-12 MED ORDER — FOLIC ACID 1 MG PO TABS
1.0000 mg | ORAL_TABLET | Freq: Every day | ORAL | Status: DC
  Administered 2017-08-13 – 2017-08-14 (×2): 1 mg via ORAL
  Filled 2017-08-12 (×2): qty 1

## 2017-08-12 NOTE — ED Triage Notes (Signed)
Rectal bleeding onset today, history of blood clots, patient is taking blood thinner

## 2017-08-12 NOTE — H&P (Signed)
History and Physical    Wanda Curry IRS:[id number] DOB: June 13, 1946 DOA: 08/12/2017  PCP: Earney Mallet, MD  Patient coming from: home  I have personally briefly reviewed patient's old medical records in [place]  Chief Complaint: BRBPR  HPI: Wanda Curry is a 71 y.o. female with medical history significant of colon cancer status post right hemicolectomy, anemia, rheumatoid arthritis, PE on Xarelto, type 2 diabetes presenting with bright red blood per rectum.  Patient notes that she noticed bright red blood per rectum after using the restroom around 11:00 today.  She just noticed one episode.  She has been on Xarelto for the past few months for a pulmonary embolism.  This occurred in the setting of cancer.  She also had surgery back in February.  She has never had a blood clot before this.  She noticed a sharp pain in her abdomen in the periumbilical region that went away after she used the restroom.  She denies any lightheadedness, shortness of breath, chest pain, vomiting.  She has a history of colon cancer and had a hemicolectomy back in February.  She had a colonoscopy with diverticulosis noted in February as well.  ED Course: Labs, rectal exam with dark blood.  GI consult.  Hospitalist to admit for observation and workup of suspected lower GI bleed.  Review of Systems: As per HPI otherwise 10 point review of systems negative.   Past Medical History:  Diagnosis Date  . Anemia   . Arthritis    RA  . Cancer ([place])    skin cancer  . Depression   . Diabetes mellitus without complication ([place])   . Fibromyalgia   . GERD (gastroesophageal reflux disease)   . Heart murmur    ECHO scheduled 05-25-2015  . Hyperlipidemia   . Neuropathy   . PONV (postoperative nausea and vomiting)     Past Surgical History:  Procedure Laterality Date  . ABDOMINAL HYSTERECTOMY    . BIOPSY  09/23/2016   Procedure: BIOPSY;  Surgeon: Danie Binder, MD;  [facility];  Service:  Endoscopy;;  hepatic flexure mass  . CHOLECYSTECTOMY    . COLONOSCOPY WITH PROPOFOL N/A 09/23/2016   Procedure: COLONOSCOPY WITH PROPOFOL;  Surgeon: Danie Binder, MD;  [facility];  Service: Endoscopy;  Laterality: N/A;  7:30 am  . HARDWARE REMOVAL Right 05/30/2015   Procedure: HARDWARE REMOVAL;  Surgeon: Ninetta Lights, MD;  Location: [place];  Service: Orthopedics;  Laterality: Right;  . PARTIAL COLECTOMY N/A 10/10/2016   Procedure: PARTIAL COLECTOMY;  Surgeon: Aviva Signs, MD;  Location: [facility];  Service: General;  Laterality: N/A;  . planter warts Bilateral    both feet  . POLYPECTOMY  09/23/2016   Procedure: POLYPECTOMY;  Surgeon: Danie Binder, MD;  [facility];  Service: Endoscopy;;  transverse colon polyps times 2, rectal polyp  . skin grafts     due to planter warts  . TOTAL KNEE ARTHROPLASTY Right 05/30/2015   Procedure: RIGHT TOTAL KNEE ARTHROPLASTY;  Surgeon: Ninetta Lights, MD;  Location: [place];  Service: Orthopedics;  Laterality: Right;     reports that she quit smoking about 12 years ago. Her smoking use included cigarettes. She has a 40.00 pack-year smoking history. she has never used smokeless tobacco. She reports that she does not drink alcohol or use drugs.  Allergies  Allergen Reactions  . Adhesive [Tape]     Adhesive tape and band-aids cause skin irritation  . Sulfa  Antibiotics Nausea Only and Other (See Comments)    Joint paint  . Erythromycin Itching and Rash    burning    Family History  Problem Relation Age of Onset  . Colon cancer Neg Hx    Prior to Admission medications   Medication Sig Start Date End Date Taking? Authorizing Provider  acetaminophen ([employer] PAIN RELIEF EXTRA STRENGTH) 500 MG tablet Take 500 mg by mouth every 6 (six) hours as needed (for pain.).   Yes [provider]  atorvastatin (LIPITOR) 20 MG tablet Take 20 mg by mouth every morning.    Yes [provider]  Calcium Carb-Cholecalciferol (CALCIUM  + D3) 600-200 MG-UNIT TABS Take 1 tablet by mouth 2 (two) times daily. 07/16/16  Yes [provider]  carvedilol (COREG) 6.25 MG tablet Take 6.25 mg by mouth 2 (two) times daily with a meal.   Yes [provider]  folic acid (FOLVITE) 1 MG tablet Take 1 mg by mouth daily.   Yes [provider]  furosemide (LASIX) 40 MG tablet Take 40 mg daily by mouth.   Yes [provider]  gabapentin (NEURONTIN) 600 MG tablet Take 600 mg by mouth 2 (two) times daily. MAY TAKE 3RD CAPSULE IF NEEDED   Yes [provider]  GLUCOSAMINE-CHONDROITIN PO Take 1 tablet by mouth 2 (two) times daily.   Yes [provider]  Insulin Glargine (BASAGLAR KWIKPEN [place]) Inject 42 Units into the skin every morning.   Yes [provider]  KLOR-CON M20 20 MEQ tablet Take 20 mEq by mouth daily. 07/28/17  Yes [provider]  magnesium gluconate (MAGONATE) 30 MG tablet Take 30 mg as needed by mouth.   Yes [provider]  methotrexate (RHEUMATREX) 2.5 MG tablet Take 20 mg by mouth every Sunday. Caution:Chemotherapy. Protect from light.    Yes [provider]  Milnacipran HCl (SAVELLA) 100 MG TABS tablet Take 100 mg by mouth 2 (two) times daily.   Yes [provider]  Multiple Vitamins-Minerals (CENTRUM SILVER PO) Take 1 tablet by mouth daily.   Yes [provider]  NOVOLOG MIX 70/30 FLEXPEN (70-30) 100 UNIT/ML FlexPen INJECT 30 UNITS SUBCUTANEOUSLY TWICE DAILY AT BREAKFAST AND DINNER 09/14/16  Yes [provider]  omeprazole (PRILOSEC) 40 MG capsule TAKE ONE CAPSULE BY MOUTH ONCE A DAY 11/17/16  Yes [provider]  rivaroxaban (XARELTO) 20 MG TABS tablet Take 1 tablet (20 mg total) by mouth daily with supper. 06/09/17  Yes Holley Bouche, NP  rOPINIRole (REQUIP) 1 MG tablet Take 1 mg by mouth 2 (two) times daily. MAY TAKE 3RD TABLET AS NEEDED   Yes [provider]  Vitamin D, Ergocalciferol, (DRISDOL)  50000 units CAPS capsule Take 50,000 Units by mouth every Wednesday.   Yes [provider]    Physical Exam: Vitals:   08/12/17 1258 08/12/17 1308 08/12/17 1315 08/12/17 1743  BP: (!) 127/54   (!) 148/55  Pulse: 88   89  Resp: 20   18  Temp: 98.2 F (36.8 C)   98.7 F (37.1 C)  TempSrc: Oral   Oral  SpO2: (!) 18%  95% 100%  Weight: 112.9 kg (249 lb) 112.9 kg (249 lb)  112.7 kg (248 lb 7.3 oz)  Height:  5\' 4"  (1.626 m)  5\' 4"  (1.626 m)    Constitutional: NAD, calm, comfortable Vitals:   08/12/17 1258 08/12/17 1308 08/12/17 1315 08/12/17 1743  BP: (!) 127/54   (!) 148/55  Pulse: 88  89  Resp: 20   18  Temp: 98.2 F (36.8 C)   98.7 F (37.1 C)  TempSrc: Oral   Oral  SpO2: (!) 18%  95% 100%  Weight: 112.9 kg (249 lb) 112.9 kg (249 lb)  112.7 kg (248 lb 7.3 oz)  Height:  5\' 4"  (1.626 m)  5\' 4"  (1.626 m)   Eyes: PERRL, lids and conjunctivae normal ENMT: Mucous membranes are moist. Posterior pharynx clear of any exudate or lesions.Normal dentition.  Neck: normal, supple, no masses, no thyromegaly Respiratory: clear to auscultation bilaterally, no wheezing, no crackles. Normal respiratory effort. No accessory muscle use.  Cardiovascular: Regular rate and rhythm, no murmurs / rubs / gallops. Nonpitting LEE. 2+ pedal pulses. No carotid bruits.  Abdomen: no tenderness, no masses palpated. No hepatosplenomegaly. Bowel sounds positive.  Musculoskeletal: no clubbing / cyanosis. No joint deformity upper and lower extremities. Good ROM, no contractures. Normal muscle tone.  Skin: no rashes, lesions, ulcers. No induration Neurologic: CN 2-12 grossly intact. Sensation intactl. Strength 5/5 in all 4.  Psychiatric: Normal judgment and insight. Alert and oriented x 3. Normal mood.   Labs on Admission: I have personally reviewed following labs and imaging studies  CBC: Recent Labs  Lab 08/12/17 1354  WBC 8.4  NEUTROABS 5.9  HGB 9.9*  HCT 32.2*  MCV 83.0  PLT 371   Basic  Metabolic Panel: Recent Labs  Lab 08/12/17 1354  NA 137  K 3.9  CL 100*  CO2 27  GLUCOSE 302*  BUN 21*  CREATININE 1.15*  CALCIUM 9.2   GFR: Estimated Creatinine Clearance: 53.6 mL/min (A) (by C-G formula based on SCr of 1.15 mg/dL (H)). Liver Function Tests: Recent Labs  Lab 08/12/17 1354  AST 28  ALT 27  ALKPHOS 63  BILITOT 0.5  PROT 7.2  ALBUMIN 3.4*   No results for input(s): LIPASE, AMYLASE in the last 168 hours. No results for input(s): AMMONIA in the last 168 hours. Coagulation Profile: Recent Labs  Lab 08/12/17 1354  INR 1.33   Cardiac Enzymes: No results for input(s): CKTOTAL, CKMB, CKMBINDEX, TROPONINI in the last 168 hours. BNP (last 3 results) No results for input(s): PROBNP in the last 8760 hours. HbA1C: No results for input(s): HGBA1C in the last 72 hours. CBG: No results for input(s): GLUCAP in the last 168 hours. Lipid Profile: No results for input(s): CHOL, HDL, LDLCALC, TRIG, CHOLHDL, LDLDIRECT in the last 72 hours. Thyroid Function Tests: No results for input(s): TSH, T4TOTAL, FREET4, T3FREE, THYROIDAB in the last 72 hours. Anemia Panel: No results for input(s): VITAMINB12, FOLATE, FERRITIN, TIBC, IRON, RETICCTPCT in the last 72 hours. Urine analysis:    Component Value Date/Time   COLORURINE STRAW (A) 08/12/2017 1323   APPEARANCEUR CLEAR 08/12/2017 1323   LABSPEC 1.006 08/12/2017 1323   PHURINE 5.0 08/12/2017 1323   GLUCOSEU >=500 (A) 08/12/2017 1323   HGBUR MODERATE (A) 08/12/2017 1323   BILIRUBINUR NEGATIVE 08/12/2017 1323   KETONESUR NEGATIVE 08/12/2017 1323   PROTEINUR NEGATIVE 08/12/2017 1323   NITRITE NEGATIVE 08/12/2017 1323   LEUKOCYTESUR LARGE (A) 08/12/2017 1323    Radiological Exams on Admission: No results found.  EKG: Independently reviewed. none  Assessment/Plan Active Problems:   GI bleed  GI Bleed  Anemia:  Likely lower given BRBPR.  Diverticulosis seen on recent colonoscopy.  S/p R hemicolectomy on 10/10/16  for colon adenocarcinoma.  Will hold xarelo.  GI c/s. Clear liquid diet, NPO after midnight GI c/s appreciate recs H/H slightly downtrended from  last visit, follow q8 H/H Type and screen, 2 PIV Iron, ferritin, B12, folate  Bilateral PE:  Dx 03/25/2017.  Thought 2/2 hypercoag due to malignancy per Dr. Laverle Patter notes.  Had planned to repeat CTA at 6 months in February and do hypercoag workup at that time.  Will hold xarelto for now Resume when able given likely hypercoag with malignancy, she'd probably benefit from lifetime anticoagulation if able given her cancer    Stage IIA colon adenocarcinoma:  Pt previously on xeloda, but did not tolerate side effects. Following with Dr. Talbert Cage.  Of note recent CT notable for increase in size of R perihilar liver lesion concerning for hepatic metastatic disease (also noted pulm nodules).  Looks like planning for another pet scan per oncology.  Outpatient onc f/u  Abnormal UA:  UA with LE, WBC.  Moderate blood on dipstick but 0-5 on microscopic.  Consider repeat as outpatient.   RA: methotrexate T2DM: hold glargine given procedure, continue SSI.  HbA1c. Should be on statin with DM (no ASA given bleed and need for resuming xarelto eventually).  LE edema: holding lasix RLS: requip HTN: coreg  DVT prophylaxis: SCD  Code Status: full code  Family Communication: daughter at bedside  Disposition Plan: pending  Consults called: GI  Admission status: obs    Fayrene Helper MD [facility] Pager [phone]  If 7PM-7AM, please contact night-coverage [email or web address] Password [facility]  08/12/2017, 6:21 PM

## 2017-08-12 NOTE — ED Provider Notes (Signed)
[facility] EMERGENCY DEPARTMENT Provider Note   CSN: [id number] Arrival date & time: 08/12/17  1143     History   Chief Complaint Chief Complaint  Patient presents with  . Rectal Bleeding    HPI Wanda Curry is a 71 y.o. female.  HPI  The pt is a 72 y/ o female with hx of Colon CA - she has had a resection - has not tolerated the chemo but has had a post surgical DVT - and is on xarelto - she has had some bleeding from the rectum today and it is BRBPR.  Has some cramping in the abdomen with BM but then feels better after.  No SOB.  Has hx of anemia - no hx of Liver or Kidney disease - takes interm ittent lasix. Sx are intermittent Started this morning No associated syncope. No hx of blood transfusion  Past Medical History:  Diagnosis Date  . Anemia   . Arthritis    RA  . Cancer ([place])    skin cancer  . Depression   . Diabetes mellitus without complication ([place])   . Fibromyalgia   . GERD (gastroesophageal reflux disease)   . Heart murmur    ECHO scheduled 05-25-2015  . Hyperlipidemia   . Neuropathy   . PONV (postoperative nausea and vomiting)     Patient Active Problem List   Diagnosis Date Noted  . Acute pulmonary embolism ([place]) 04/15/2017  . Malignant neoplasm of colon ([place]) 11/26/2016  . Colon cancer ([place]) 10/10/2016  . Heme + stool 09/10/2016  . Constipation 09/10/2016  . DJD (degenerative joint disease) of knee 05/30/2015    Past Surgical History:  Procedure Laterality Date  . ABDOMINAL HYSTERECTOMY    . BIOPSY  09/23/2016   Procedure: BIOPSY;  Surgeon: Danie Binder, MD;  [facility];  Service: Endoscopy;;  hepatic flexure mass  . CHOLECYSTECTOMY    . COLONOSCOPY WITH PROPOFOL N/A 09/23/2016   Procedure: COLONOSCOPY WITH PROPOFOL;  Surgeon: Danie Binder, MD;  [facility];  Service: Endoscopy;  Laterality: N/A;  7:30 am  . HARDWARE REMOVAL Right 05/30/2015   Procedure: HARDWARE REMOVAL;  Surgeon: Ninetta Lights, MD;   Location: [place];  Service: Orthopedics;  Laterality: Right;  . PARTIAL COLECTOMY N/A 10/10/2016   Procedure: PARTIAL COLECTOMY;  Surgeon: Aviva Signs, MD;  Location[facility];  Service: General;  Laterality: N/A;  . planter warts Bilateral    both feet  . POLYPECTOMY  09/23/2016   Procedure: POLYPECTOMY;  Surgeon: Danie Binder, MD;  [facility];  Service: Endoscopy;;  transverse colon polyps times 2, rectal polyp  . skin grafts     due to planter warts  . TOTAL KNEE ARTHROPLASTY Right 05/30/2015   Procedure: RIGHT TOTAL KNEE ARTHROPLASTY;  Surgeon: Ninetta Lights, MD;  Location: [place];  Service: Orthopedics;  Laterality: Right;    OB History    No data available       Home Medications    Prior to Admission medications   Medication Sig Start Date End Date Taking? Authorizing Provider  acetaminophen ([employer] PAIN RELIEF EXTRA STRENGTH) 500 MG tablet Take 500 mg by mouth every 6 (six) hours as needed (for pain.).    [provider]  aspirin EC 325 MG tablet Take 325 mg by mouth daily.    [provider]  atorvastatin (LIPITOR) 20 MG tablet Take 20 mg by mouth daily.    [provider]  Calcium  Carb-Cholecalciferol (CALCIUM + D3) 600-200 MG-UNIT TABS Take 1 tablet by mouth 2 (two) times daily. 07/16/16   [provider]  carvedilol (COREG) 6.25 MG tablet Take 6.25 mg by mouth 2 (two) times daily with a meal.    [provider]  folic acid (FOLVITE) 1 MG tablet Take 1 mg by mouth daily.    [provider]  furosemide (LASIX) 40 MG tablet Take 40 mg daily by mouth.    [provider]  gabapentin (NEURONTIN) 600 MG tablet Take 600 mg by mouth 3 (three) times daily.    [provider]  GLUCOSAMINE-CHONDROITIN PO Take 1 tablet by mouth 2 (two) times daily.    [provider]  Insulin Glargine (BASAGLAR KWIKPEN [place]) Inject 42 Units into the skin every morning.    [provider]  magnesium gluconate  (MAGONATE) 30 MG tablet Take 30 mg as needed by mouth.    [provider]  methotrexate (RHEUMATREX) 2.5 MG tablet Take 20 mg by mouth every Sunday. Caution:Chemotherapy. Protect from light.     [provider]  Milnacipran HCl (SAVELLA) 100 MG TABS tablet Take 100 mg by mouth 2 (two) times daily.    [provider]  Multiple Vitamins-Minerals (CENTRUM SILVER PO) Take 1 tablet by mouth daily.    [provider]  NOVOLOG MIX 70/30 FLEXPEN (70-30) 100 UNIT/ML FlexPen INJECT 30 UNITS SUBCUTANEOUSLY TWICE DAILY AT [facility] AND DINNER 09/14/16   [provider]  omeprazole (PRILOSEC) 40 MG capsule TAKE ONE CAPSULE BY MOUTH ONCE A DAY 11/17/16   [provider]  rivaroxaban (XARELTO) 20 MG TABS tablet Take 1 tablet (20 mg total) by mouth daily with supper. 06/09/17   Holley Bouche, NP  rOPINIRole (REQUIP) 1 MG tablet Take 1 mg by mouth 3 (three) times daily.    [provider]    Family History Family History  Problem Relation Age of Onset  . Colon cancer Neg Hx     Social History Social History   Tobacco Use  . Smoking status: Former Smoker    Packs/day: 1.00    Years: 40.00    Pack years: 40.00    Types: Cigarettes    Last attempt to quit: 08/18/2004    Years since quitting: 12.9  . Smokeless tobacco: Never Used  Substance Use Topics  . Alcohol use: No  . Drug use: No     Allergies   Adhesive [tape]; Sulfa antibiotics; and Erythromycin   Review of Systems Review of Systems  All other systems reviewed and are negative.    Physical Exam Updated Vital Signs BP (!) 127/54   Pulse 88   Temp 98.2 F (36.8 C) (Oral)   Resp 20   Ht 5\' 4"  (1.626 m)   Wt 112.9 kg (249 lb)   SpO2 95%   BMI 42.74 kg/m   Physical Exam  Constitutional: She appears well-developed and well-nourished. No distress.  HENT:  Head: Normocephalic and atraumatic.  Mouth/Throat: Oropharynx is clear and moist. No oropharyngeal exudate.    Eyes: Conjunctivae and EOM are normal. Pupils are equal, round, and reactive to light. Right eye exhibits no discharge. Left eye exhibits no discharge. No scleral icterus.  Neck: Normal range of motion. Neck supple. No JVD present. No thyromegaly present.  Cardiovascular: Normal rate, regular rhythm, normal heart sounds and intact distal pulses. Exam reveals no gallop and no friction rub.  No murmur heard. Pulmonary/Chest: Effort normal and breath sounds normal. No respiratory distress.  She has no wheezes. She has no rales.  Abdominal: Soft. Bowel sounds are normal. She exhibits no distension and no mass. There is no tenderness.  Genitourinary:  Genitourinary Comments: Dark red blood no hemorrhoids seen.  Musculoskeletal: Normal range of motion. She exhibits no edema or tenderness.  Lymphadenopathy:    She has no cervical adenopathy.  Neurological: She is alert. Coordination normal.  Skin: Skin is warm and dry. No rash noted. No erythema.  Psychiatric: She has a normal mood and affect. Her behavior is normal.  Nursing note and vitals reviewed.    ED Treatments / Results  Labs (all labs ordered are listed, but only abnormal results are displayed) Labs Reviewed  CBC WITH DIFFERENTIAL/PLATELET - Abnormal; Notable for the following components:      Result Value   Hemoglobin 9.9 (*)    HCT 32.2 (*)    MCH 25.5 (*)    RDW 18.8 (*)    All other components within normal limits  COMPREHENSIVE METABOLIC PANEL - Abnormal; Notable for the following components:   Chloride 100 (*)    Glucose, Bld 302 (*)    BUN 21 (*)    Creatinine, Ser 1.15 (*)    Albumin 3.4 (*)    GFR calc non Af Amer 46 (*)    GFR calc Af Amer 53 (*)    All other components within normal limits  PROTIME-INR - Abnormal; Notable for the following components:   Prothrombin Time 16.4 (*)    All other components within normal limits  URINALYSIS, ROUTINE W REFLEX MICROSCOPIC - Abnormal; Notable for the following  components:   Color, Urine STRAW (*)    Glucose, UA >=500 (*)    Hgb urine dipstick MODERATE (*)    Leukocytes, UA LARGE (*)    Bacteria, UA RARE (*)    Squamous Epithelial / LPF 0-5 (*)    All other components within normal limits  OCCULT BLOOD X 1 CARD TO LAB, STOOL  TYPE AND SCREEN     Radiology No results found.  Procedures Procedures (including critical care time)  Procedure Note:  Anoscopy  Risks benefits alternatives of the procedure given to the patient Verbal Consent obtained Patient placed in the lateral decubitus position Anoscopy performed Findings:  No hemorrhoids, no fissures, no masses, Dark Red Blood present Patient tolerated procedure without any complaints   Medications Ordered in ED Medications  0.9 %  sodium chloride infusion ( Intravenous New Bag/Given 08/12/17 1419)     Initial Impression / Assessment and Plan / ED Course  I have reviewed the triage vital signs and the nursing notes.  Pertinent labs & imaging results that were available during my care of the patient were reviewed by me and considered in my medical decision making (see chart for details).     The patient's history is concerning for bleeding especially given her anticoagulated status with Xarelto.  She will likely need to be admitted to the hospital for the cause of her bleeding is uncertain but abnormal.  She is in agreement with this plan.   CBC shows anemia which is lower than usual at 9.9, most recently checked in the last couple months at 10.5 No leukocytosis Endoscopy results in procedural section but shows dark red blood but no signs of fissures or hemorrhoids.    Discussed with hospitalist who will admit the patient in the hospital.  GI paged for consultation  Final Clinical Impressions(s) / ED Diagnoses   Final diagnoses:  Lower GI  bleed  Anemia, unspecified type    ED Discharge Orders    None       Noemi Chapel, MD 08/12/17 [phone]

## 2017-08-12 NOTE — Consult Note (Signed)
Referring Provider: Melven Sartorius. MD Primary Care Physician:  Earney Mallet, MD Primary Gastroenterologist:  Dr. Oneida Alar.  Reason for Consultation:    Rectal bleeding.  HPI:   Patient is 71 year old Caucasian female who has a history of colon carcinoma details of which I reviewed below was in usual state of health until this morning when she experienced sharp periumbilical pain followed by urge to have a bowel movement and she passed stool followed by a large amount of bright red blood per rectum.  She did not feel dizzy or lightheaded.  She called her daughter and was immediately brought to ER for evaluation.  Patient has not passed any more blood per rectum.  Her hemoglobin was noted to be 9.9 g.  About 4 weeks ago her hemoglobin was 10.4 g.  Patient was deemed to be hemodynamically stable.  Rectal examination by Dr. Sabra Heck revealed gross blood.  Patient was hospitalized for further evaluation. Patient states she had right hemicolectomy in February this year for colon carcinoma at hepatic flexure.  She has had problems with diarrhea and urgency but she slowly has been getting better.  She passed soft stool yesterday.  She is stated she lost over 30-40 pounds following the surgery but she has gained some of it back.  She feels some of the weight gain is due to fluid retention. She has been on anticoagulant since August, 2018 when she was diagnosed with pulmonary embolism on a CT that she had for follow-up of pulmonary nodule and hepatic lesion.  She had follow-up CT on 06/23/2017 revealing resolution of pulmonary emboli. Patient states her heartburn is well controlled with PPI.  There is no history of peptic ulcer disease.  She does not take OTC NSAIDs.   History of colon carcinoma is as follows.  She was noted to have heme positive stools and therefore agreed to undergo diagnostic colonoscopy which was performed by Dr. Oneida Alar on 09/23/2016.  She had multiple adenomas removed.  She had a  large polyp above ileocecal valve as well as ulcerated mass in the region of hepatic flexure and biopsy confirmed adenocarcinoma.  She underwent right hemicolectomy by Dr. Arnoldo Morale on 10/10/2016.  Preprocedure CT had revealed a small pulmonary nodule measuring 6 mm as well as a single hepatic lesion. She had a PET scan in April of this year and it did not reveal hypermetabolic activity in lungs or liver.  Patient was begun on adjuvant therapy with Xeloda which was stopped after second cycle because she developed skin rash and blisters.  She has not fully recovered from her skin rash.  Her daughter states that she forgets and scratches and scabs come off. She is scheduled to undergo follow-up PET scan in 4 weeks.    Past Medical History:  Diagnosis Date  . Anemia   . Arthritis    RA  . Cancer ([place])    skin cancer  . Depression   . Diabetes mellitus without complication ([place])   . Fibromyalgia   . GERD (gastroesophageal reflux disease)   . Heart murmur    ECHO scheduled 05-25-2015  . Hyperlipidemia   . Neuropathy   . PONV (postoperative nausea and vomiting)        History of colon carcinoma as above.  Past Surgical History:  Procedure Laterality Date  . ABDOMINAL HYSTERECTOMY    . BIOPSY  09/23/2016   Procedure: BIOPSY;  Surgeon: Danie Binder, MD;  [facility];  Service: Endoscopy;;  hepatic flexure mass  .  CHOLECYSTECTOMY    . COLONOSCOPY WITH PROPOFOL N/A 09/23/2016   Procedure: COLONOSCOPY WITH PROPOFOL;  Surgeon: Danie Binder, MD;  Location: [facility];  Service: Endoscopy;  Laterality: N/A;  7:30 am  . HARDWARE REMOVAL Right 05/30/2015   Procedure: HARDWARE REMOVAL;  Surgeon: Ninetta Lights, MD;  Location: [facility];  Service: Orthopedics;  Laterality: Right;  . PARTIAL COLECTOMY N/A 10/10/2016   Procedure: PARTIAL COLECTOMY;  Surgeon: Aviva Signs, MD;  Location: [facility];  Service: General;  Laterality: N/A;  . planter warts Bilateral    both feet  . POLYPECTOMY   09/23/2016   Procedure: POLYPECTOMY;  Surgeon: Danie Binder, MD;  [facility];  Service: Endoscopy;;  transverse colon polyps times 2, rectal polyp  . skin grafts     due to planter warts  . TOTAL KNEE ARTHROPLASTY Right 05/30/2015   Procedure: RIGHT TOTAL KNEE ARTHROPLASTY;  Surgeon: Ninetta Lights, MD;  Location: [facility];  Service: Orthopedics;  Laterality: Right;    Prior to Admission medications   Medication Sig Start Date End Date Taking? Authorizing Provider  acetaminophen ([employer] PAIN RELIEF EXTRA STRENGTH) 500 MG tablet Take 500 mg by mouth every 6 (six) hours as needed (for pain.).   Yes [provider]  atorvastatin (LIPITOR) 20 MG tablet Take 20 mg by mouth every morning.    Yes [provider]  Calcium Carb-Cholecalciferol (CALCIUM + D3) 600-200 MG-UNIT TABS Take 1 tablet by mouth 2 (two) times daily. 07/16/16  Yes [provider]  carvedilol (COREG) 6.25 MG tablet Take 6.25 mg by mouth 2 (two) times daily with a meal.   Yes [provider]  folic acid (FOLVITE) 1 MG tablet Take 1 mg by mouth daily.   Yes [provider]  furosemide (LASIX) 40 MG tablet Take 40 mg daily by mouth.   Yes [provider]  gabapentin (NEURONTIN) 600 MG tablet Take 600 mg by mouth 2 (two) times daily. MAY TAKE 3RD CAPSULE IF NEEDED   Yes [provider]  GLUCOSAMINE-CHONDROITIN PO Take 1 tablet by mouth 2 (two) times daily.   Yes [provider]  Insulin Glargine (BASAGLAR KWIKPEN [place]) Inject 42 Units into the skin every morning.   Yes [provider]  KLOR-CON M20 20 MEQ tablet Take 20 mEq by mouth daily. 07/28/17  Yes [provider]  magnesium gluconate (MAGONATE) 30 MG tablet Take 30 mg as needed by mouth.   Yes [provider]  methotrexate (RHEUMATREX) 2.5 MG tablet Take 20 mg by mouth every Sunday. Caution:Chemotherapy. Protect from light.    Yes [provider]  Milnacipran HCl  (SAVELLA) 100 MG TABS tablet Take 100 mg by mouth 2 (two) times daily.   Yes [provider]  Multiple Vitamins-Minerals (CENTRUM SILVER PO) Take 1 tablet by mouth daily.   Yes [provider]  NOVOLOG MIX 70/30 FLEXPEN (70-30) 100 UNIT/ML FlexPen INJECT 30 UNITS SUBCUTANEOUSLY TWICE DAILY AT BREAKFAST AND DINNER 09/14/16  Yes [provider]  omeprazole (PRILOSEC) 40 MG capsule TAKE ONE CAPSULE BY MOUTH ONCE A DAY 11/17/16  Yes [provider]  rivaroxaban (XARELTO) 20 MG TABS tablet Take 1 tablet (20 mg total) by mouth daily with supper. 06/09/17  Yes Holley Bouche, NP  rOPINIRole (REQUIP) 1 MG tablet Take 1 mg by mouth 2 (two) times daily. MAY TAKE 3RD TABLET AS NEEDED   Yes [provider]  Vitamin D, Ergocalciferol, (DRISDOL) 50000 units CAPS capsule  Take 50,000 Units by mouth every Wednesday.   Yes [provider]    Current Facility-Administered Medications  Medication Dose Route Frequency Provider Last Rate Last Dose  . 0.9 %  sodium chloride infusion   Intravenous Continuous Elodia Florence., MD 125 mL/hr at 08/12/17 1900    . [START ON 08/13/2017] carvedilol (COREG) tablet 6.25 mg  6.25 mg Oral BID WC Elodia Florence., MD      . Derrill Memo ON 78/29/5621] folic acid (FOLVITE) tablet 1 mg  1 mg Oral Daily Elodia Florence., MD      . gabapentin (NEURONTIN) tablet 600 mg  600 mg Oral TID Elodia Florence., MD      . insulin aspart (novoLOG) injection 0-5 Units  0-5 Units Subcutaneous QHS Elodia Florence., MD      . Derrill Memo ON 08/13/2017] insulin aspart (novoLOG) injection 0-9 Units  0-9 Units Subcutaneous TID WC Elodia Florence., MD      . Derrill Memo ON 08/13/2017] pantoprazole (PROTONIX) EC tablet 40 mg  40 mg Oral QAC breakfast Kaliopi Blyden, Mechele Dawley, MD      . Derrill Memo ON 08/13/2017] polyethylene glycol-electrolytes (NuLYTELY/GoLYTELY) solution 4,000 mL  4,000 mL Oral Once Jamica Woodyard, Mechele Dawley, MD      . rOPINIRole  (REQUIP) tablet 1 mg  1 mg Oral TID Elodia Florence., MD        Allergies as of 08/12/2017 - Review Complete 08/12/2017  Allergen Reaction Noted  . Adhesive [tape]  05/16/2015  . Sulfa antibiotics Nausea Only and Other (See Comments) 05/30/2015  . Erythromycin Itching and Rash 05/16/2015    Family History  Problem Relation Age of Onset  . Colon cancer Neg Hx     Social History   Socioeconomic History  . Marital status: Widowed    Spouse name: Not on file  . Number of children: Not on file  . Years of education: Not on file  . Highest education level: Not on file  Social Needs  . Financial resource strain: Not on file  . Food insecurity - worry: Not on file  . Food insecurity - inability: Not on file  . Transportation needs - medical: Not on file  . Transportation needs - non-medical: Not on file  Occupational History  . Not on file  Tobacco Use  . Smoking status: Former Smoker    Packs/day: 1.00    Years: 40.00    Pack years: 40.00    Types: Cigarettes    Last attempt to quit: 08/18/2004    Years since quitting: 12.9  . Smokeless tobacco: Never Used  Substance and Sexual Activity  . Alcohol use: No  . Drug use: No  . Sexual activity: Not Currently    Birth control/protection: Surgical  Other Topics Concern  . Not on file  Social History Narrative  . Not on file    Review of Systems: See HPI, otherwise normal ROS  Physical Exam: Temp:  [98.2 F (36.8 C)-98.7 F (37.1 C)] 98.7 F (37.1 C) (12/26 1743) Pulse Rate:  [88-89] 89 (12/26 1743) Resp:  [18-20] 18 (12/26 1743) BP: (127-148)/(54-55) 148/55 (12/26 1743) SpO2:  [18 %-100 %] 100 % (12/26 1743) Weight:  [240 lb (108.9 kg)-249 lb (112.9 kg)] 248 lb 7.3 oz (112.7 kg) (12/26 1743)   Patient is alert and in no acute distress. She appears pale but conjunctivae is pink. Sclera is nonicteric. Oropharyngeal course is normal. Enlarged right lobe of thyroid.  It is soft and nontender.  No  adenopathy. Cardiac exam with regular rhythm normal S1 and S2.  No murmur or gallop noted. Lungs are clear to auscultation. Abdomen is full.  She has midline and right subcostal scars.  Bowel sounds are hyperactive.  No bruit noted.  On palpation abdomen is soft with mild tenderness in hypogastric region as well as periumbilical region.  No organomegaly or masses.  She has nonpitting and pitting pretibial edema. She has small area of papular rash above left malleolus. She has multiple scab covered skin lesions in both forearms.  Lab Results: Recent Labs    08/12/17 1354 08/12/17 1856  WBC 8.4  --   HGB 9.9* 9.6*  HCT 32.2* 31.6*  PLT 304  --    BMET Recent Labs    08/12/17 1354  NA 137  K 3.9  CL 100*  CO2 27  GLUCOSE 302*  BUN 21*  CREATININE 1.15*  CALCIUM 9.2   LFT Recent Labs    08/12/17 1354  PROT 7.2  ALBUMIN 3.4*  AST 28  ALT 27  ALKPHOS 63  BILITOT 0.5   PT/INR Recent Labs    08/12/17 1354  LABPROT 16.4*  INR 1.33    Assessment; Rectal bleeding in a patient anticoagulated because of history of pulmonary embolism.  He is hemodynamically stable.  Right hemicolectomy in February this year for colon carcinoma.  This could be in February this year also revealed colonic diverticulosis. Suspect colonic diverticular bleed.  However she could also have ischemic colitis given extensive aortic atherosclerosis.  Since she needs to be back on anticoagulation diagnostic colonoscopy would be recommended.  Anticoagulation is on hold for normal.  Last Rivaroxaban dose was yesterday  Enlarging hepatic lesion concerning for metastatic disease.  PET scan in April 2018 did not reveal hypermetabolic activity.  She is scheduled for follow-up PET scan in about 4 weeks.  She also has small pulmonary nodule measuring 7.3 mm on most recent CT of 06/23/2017.  This lesion would also be evaluated on planned PET scan for next month.   Recommendations;  Serial H&H as you are  doing. Resume PPI which she takes for GERD. Diagnostic colonoscopy to be performed on 08/13/2017 by Dr. Gala Romney as Dr. Oneida Alar is out of town. She would be prepped with Nulytely tomorrow morning.   LOS: 0 days   Shambria Camerer  08/12/2017, 7:54 PM

## 2017-08-13 ENCOUNTER — Encounter (HOSPITAL_COMMUNITY): Payer: Self-pay

## 2017-08-13 ENCOUNTER — Other Ambulatory Visit: Payer: Self-pay

## 2017-08-13 ENCOUNTER — Encounter (HOSPITAL_COMMUNITY)
Admission: EM | Disposition: A | Discharge status: Home / Self Care | Payer: Self-pay | Source: Home / Self Care | Attending: Emergency Medicine

## 2017-08-13 DIAGNOSIS — K5791 Diverticulosis of intestine, part unspecified, without perforation or abscess with bleeding: Secondary | ICD-10-CM | POA: Diagnosis not present

## 2017-08-13 DIAGNOSIS — K921 Melena: Secondary | ICD-10-CM | POA: Diagnosis not present

## 2017-08-13 HISTORY — PX: COLONOSCOPY: SHX5424

## 2017-08-13 LAB — COMPREHENSIVE METABOLIC PANEL
ALT: 23 U/L (ref 14–54)
AST: 23 U/L (ref 15–41)
Albumin: 2.9 g/dL — ABNORMAL LOW (ref 3.5–5.0)
Alkaline Phosphatase: 55 U/L (ref 38–126)
Anion gap: 10 (ref 5–15)
BUN: 17 mg/dL (ref 6–20)
CO2: 27 mmol/L (ref 22–32)
Calcium: 8.9 mg/dL (ref 8.9–10.3)
Chloride: 105 mmol/L (ref 101–111)
Creatinine, Ser: 0.89 mg/dL (ref 0.44–1.00)
GFR calc Af Amer: >60 mL/min (ref >60)
GFR calc non Af Amer: >60 mL/min (ref >60)
Glucose, Bld: 144 mg/dL — ABNORMAL HIGH (ref 65–99)
Potassium: 4.1 mmol/L (ref 3.5–5.1)
Sodium: 142 mmol/L (ref 135–145)
Total Bilirubin: 0.5 mg/dL (ref 0.3–1.2)
Total Protein: 6.2 g/dL — ABNORMAL LOW (ref 6.5–8.1)

## 2017-08-13 LAB — GLUCOSE, CAPILLARY
Glucose-Capillary: 159 mg/dL — ABNORMAL HIGH (ref 65–99)
Glucose-Capillary: 151 mg/dL — ABNORMAL HIGH (ref 65–99)
Glucose-Capillary: 107 mg/dL — ABNORMAL HIGH (ref 65–99)
Glucose-Capillary: 106 mg/dL — ABNORMAL HIGH (ref 65–99)
Glucose-Capillary: 211 mg/dL — ABNORMAL HIGH (ref 65–99)

## 2017-08-13 LAB — HEMATOCRIT
HCT: 29.4 % — ABNORMAL LOW (ref 36.0–46.0)
HCT: 31 % — ABNORMAL LOW (ref 36.0–46.0)
HCT: 29.1 % — ABNORMAL LOW (ref 36.0–46.0)

## 2017-08-13 LAB — FERRITIN: Ferritin: 11 ng/mL (ref 11–307)

## 2017-08-13 LAB — HEMOGLOBIN
Hemoglobin: 9.2 g/dL — ABNORMAL LOW (ref 12.0–15.0)
Hemoglobin: 9.5 g/dL — ABNORMAL LOW (ref 12.0–15.0)
Hemoglobin: 9 g/dL — ABNORMAL LOW (ref 12.0–15.0)

## 2017-08-13 LAB — IRON AND TIBC
Iron: 18 ug/dL — ABNORMAL LOW (ref 28–170)
Saturation Ratios: 5 % — ABNORMAL LOW (ref 10.4–31.8)
TIBC: 393 ug/dL (ref 250–450)
UIBC: 375 ug/dL

## 2017-08-13 LAB — HEMOGLOBIN A1C
Hgb A1c MFr Bld: 8.3 % — ABNORMAL HIGH (ref 4.8–5.6)
Mean Plasma Glucose: 191.51 mg/dL

## 2017-08-13 LAB — VITAMIN B12: Vitamin B-12: 557 pg/mL (ref 180–914)

## 2017-08-13 LAB — FOLATE: Folate: 63.6 ng/mL (ref >5.9)

## 2017-08-13 SURGERY — COLONOSCOPY
Anesthesia: Moderate Sedation

## 2017-08-13 MED ORDER — MEPERIDINE HCL 100 MG/ML IJ SOLN
INTRAMUSCULAR | Status: AC
  Filled 2017-08-13: qty 2

## 2017-08-13 MED ORDER — ONDANSETRON HCL 4 MG/2ML IJ SOLN
INTRAMUSCULAR | Status: DC | PRN
  Administered 2017-08-13: 4 mg via INTRAVENOUS

## 2017-08-13 MED ORDER — ONDANSETRON HCL 4 MG/2ML IJ SOLN
INTRAMUSCULAR | Status: AC
  Filled 2017-08-13: qty 2

## 2017-08-13 MED ORDER — MIDAZOLAM HCL 5 MG/5ML IJ SOLN
INTRAMUSCULAR | Status: DC | PRN
  Administered 2017-08-13: 2 mg via INTRAVENOUS
  Administered 2017-08-13: 1 mg via INTRAVENOUS

## 2017-08-13 MED ORDER — STERILE WATER FOR IRRIGATION IR SOLN
Status: DC | PRN
  Administered 2017-08-13: 15:00:00

## 2017-08-13 MED ORDER — MEPERIDINE HCL 100 MG/ML IJ SOLN
INTRAMUSCULAR | Status: DC | PRN
  Administered 2017-08-13: 50 mg via INTRAVENOUS
  Administered 2017-08-13: 25 mg via INTRAVENOUS

## 2017-08-13 MED ORDER — GABAPENTIN 300 MG PO CAPS
600.0000 mg | ORAL_CAPSULE | Freq: Three times a day (TID) | ORAL | Status: DC
  Administered 2017-08-13 – 2017-08-14 (×4): 600 mg via ORAL
  Filled 2017-08-13 (×4): qty 2

## 2017-08-13 MED ORDER — MIDAZOLAM HCL 5 MG/5ML IJ SOLN
INTRAMUSCULAR | Status: AC
  Filled 2017-08-13: qty 10

## 2017-08-13 NOTE — Progress Notes (Signed)
Patient seen in endoscopy. Abdominal pain has resolved. Hemoglobin 9.5 this morning. Will proceed with a colonoscopy this afternoon per plan.  The risks, benefits, limitations, alternatives and imponderables have been reviewed with the patient. Questions have been answered. All parties are agreeable.

## 2017-08-13 NOTE — Plan of Care (Signed)
progressing 

## 2017-08-13 NOTE — Progress Notes (Signed)
Inpatient Diabetes Program Recommendations  AACE/ADA: New Consensus Statement on Inpatient Glycemic Control (2015)  Target Ranges:  Prepandial:   less than 140 mg/dL      Peak postprandial:   less than 180 mg/dL (1-2 hours)      Critically ill patients:  140 - 180 mg/dL  Results for Wanda Curry, Wanda Curry (MRN [id number]) as of 08/13/2017 13:21  Ref. Range 08/12/2017 23:38 08/13/2017 08:05 08/13/2017 11:19  Glucose-Capillary Latest Ref Range: 65 - 99 mg/dL 165 (H) 159 (H) 151 (H)   Results for LIBERTI, APPLETON (MRN [id number]) as of 08/13/2017 13:21  Ref. Range 05/17/2015 12:18 08/12/2017 18:56  Hemoglobin A1C Latest Ref Range: 4.8 - 5.6 % 8.8 (H) 8.3 (H)   Review of Glycemic Control  Diabetes history: DM2 Outpatient Diabetes medications: Basaglar 42 units QAM, 70/30 30 units BID (with breakfast and supper) Current orders for Inpatient glycemic control: Novolog 0-9 units TID with meals, Novolog 0-5 units QHS  Inpatient Diabetes Program Recommendations: Insulin - Basal: Patient reports that she took Basaglar 42 units yesterday morning and 70/30 30 units with breakfast yesterday morning prior to coming to the hospital. Glucose 159 mg/dl and 151 mg/dl today with just Novolog correction ordered. If CBGs become consistently elevated, may need to order basal insulin while inpatient.  NOTE: Spoke with patient about diabetes and home regimen for diabetes control. Patient reports that she takes Basaglar 42 units QAM and 70/30 30 units BID (with breakfast and supper) as an outpatient for diabetes control. Patient reports that she is taking insulin as prescribed and that she last took Basaglar 42 units and 70/30 30 units yesterday morning with breakfast prior to coming to the hospital.  Patient states that she checks her glucose but is not consistently checking glucose "like I should".  Patient reports that when she checks her glucose it has been in the mid 100's before meals and usually around 200 mg/dl after  eating.  Inquired about any issues with hypoglycemia and patient reports that she rarely has any hypoglycemia and reports that over the past 2 weeks she has not experienced any hypoglycemia.  Discussed importance of checking CBGs and maintaining good CBG control to prevent long-term and short-term complications.  Discussed impact of nutrition, exercise, stress, sickness, and medications on diabetes control.Encouarged patient to check her glucose 2-3 times per day.  Patient inquired about getting basal insulin. Discussed current insulin regimen with just Novolog correction. Explained that MD will follow glycemic trends to determine if basal insulin needs to be ordered but as of now glucose has been 159 and 151 mg/dl today with just Novolog correction. Anticipate glucose is fairly controlled today since she did take the Basaglar and 70/30 at home yesterday prior to coming to the hospital. If glucose becomes consistently elevated, may need to order basal insulin while inpatient. Patient is hopeful that she will be discharged today if hemoglobin is stable.  Patient verbalized understanding of information discussed and she states that she has no further questions at this time related to diabetes.  Thanks, Barnie Alderman, RN, MSN, CDE Diabetes Coordinator Inpatient Diabetes Program [phone] (Team Pager)

## 2017-08-13 NOTE — Progress Notes (Signed)
PROGRESS NOTE    Wanda Curry  EXN:[id number] DOB: 1946-07-18 DOA: 08/12/2017 PCP: Earney Mallet, MD   Brief Narrative:  Wanda Curry is Wanda Curry 71 y.o. female with medical history significant of colon cancer status post right hemicolectomy, anemia, rheumatoid arthritis, PE on Xarelto, type 2 diabetes presenting with bright red blood per rectum.  Assessment & Plan:   Principal Problem:   GI bleed Active Problems:   Colon cancer ([facility])   History of pulmonary embolism   Type 2 diabetes mellitus ([facility])   Rheumatoid arthritis ([facility])   RLS (restless legs syndrome)   HTN (hypertension)  GI Bleed  Anemia:  Likely lower given BRBPR.  Diverticulosis seen on recent colonoscopy.  S/p R hemicolectomy on 10/10/16 for colon adenocarcinoma.  Will hold xarelo.  GI c/s. GI c/s appreciate recs PPI per GI [ ]  colonoscopy today H/H slightly downtrended from last visit, follow q8 H/H - seems to have stabilized around 9 with most recent value Type and screen, 2 PIV Iron, ferritin, B12, folate - suggestive of iron deficiency anemia, will give iron transfusion prior to d/c  Bilateral PE:  Dx 03/25/2017.  Thought 2/2 hypercoag due to malignancy per Dr. Laverle Patter notes.  Had planned to repeat CTA at 6 months in February and do hypercoag workup at that time.  Will hold xarelto for now Resume when able given likely hypercoag with malignancy, she'd probably benefit from lifetime anticoagulation if able given her cancer    Stage IIA colon adenocarcinoma:  Pt previously on xeloda, but did not tolerate side effects. Following with Dr. Talbert Cage.  Of note recent CT notable for increase in size of R perihilar liver lesion concerning for hepatic metastatic disease (also noted pulm nodules).  Looks like planning for another pet scan per oncology.  Outpatient onc f/u  Abnormal UA:  UA with LE, WBC.  Moderate blood on dipstick but 0-5 on microscopic.  Consider repeat as outpatient.   RA: methotrexate T2DM: hold glargine  given procedure, continue SSI.  HbA1c. Should be on statin with DM (no ASA given bleed and need for resuming xarelto eventually).  LE edema: holding lasix RLS: requip HTN: coreg  DVT prophylaxis: SCD Code Status: full Family Communication: daughter at bedside Disposition Plan: pending GI w/u   Consultants:   Gi  Procedures: (Don't include imaging studies which can be auto populated. Include things that cannot be auto populated i.e. Echo, Carotid and venous dopplers, Foley, Bipap, HD, tubes/drains, wound vac, central lines etc)  Colonoscopy today  Antimicrobials: (specify start and planned stop date. Auto populated tables are space occupying and do not give end dates)  none    Subjective: Feeling well. No blood. Clear stools with prep.   Objective: Vitals:   08/13/17 1510 08/13/17 1515 08/13/17 1520 08/13/17 1523  BP: (!) 148/60 (!) 129/59 (!) 123/54   Pulse: 98 93 93 88  Resp: 20 (!) 29 20 14   Temp:      TempSrc:      SpO2: 99% 100% 100% 99%  Weight:      Height:        Intake/Output Summary (Last 24 hours) at 08/13/2017 1525 Last data filed at 08/13/2017 1200 Gross per 24 hour  Intake 2733.33 ml  Output -  Net 2733.33 ml   Filed Weights   08/12/17 1258 08/12/17 1308 08/12/17 1743  Weight: 112.9 kg (249 lb) 112.9 kg (249 lb) 112.7 kg (248 lb 7.3 oz)    Examination:  General exam: Appears calm and comfortable  Respiratory system: Clear to auscultation. Respiratory effort normal. Cardiovascular system: S1 & S2 heard, RRR. No JVD, murmurs, rubs, gallops or clicks.  Gastrointestinal system: Abdomen is nondistended, soft and nontender. No organomegaly or masses felt. Normal bowel sounds heard. Central nervous system: Alert and oriented. No focal neurological deficits. Extremities: nonpitting edema Skin: No rashes, lesions or ulcers Psychiatry: Judgement and insight appear normal. Mood & affect appropriate.     Data Reviewed: I have personally reviewed  following labs and imaging studies  CBC: Recent Labs  Lab 08/12/17 1354 08/12/17 1856 08/13/17 0254 08/13/17 1032  WBC 8.4  --   --   --   NEUTROABS 5.9  --   --   --   HGB 9.9* 9.6* 9.2* 9.5*  HCT 32.2* 31.6* 29.4* 31.0*  MCV 83.0  --   --   --   PLT 304  --   --   --    Basic Metabolic Panel: Recent Labs  Lab 08/12/17 1354 08/13/17 0254  NA 137 142  K 3.9 4.1  CL 100* 105  CO2 27 27  GLUCOSE 302* 144*  BUN 21* 17  CREATININE 1.15* 0.89  CALCIUM 9.2 8.9   GFR: Estimated Creatinine Clearance: 69.2 mL/min (by C-G formula based on SCr of 0.89 mg/dL). Liver Function Tests: Recent Labs  Lab 08/12/17 1354 08/13/17 0254  AST 28 23  ALT 27 23  ALKPHOS 63 55  BILITOT 0.5 0.5  PROT 7.2 6.2*  ALBUMIN 3.4* 2.9*   No results for input(s): LIPASE, AMYLASE in the last 168 hours. No results for input(s): AMMONIA in the last 168 hours. Coagulation Profile: Recent Labs  Lab 08/12/17 1354  INR 1.33   Cardiac Enzymes: No results for input(s): CKTOTAL, CKMB, CKMBINDEX, TROPONINI in the last 168 hours. BNP (last 3 results) No results for input(s): PROBNP in the last 8760 hours. HbA1C: Recent Labs    08/12/17 1856  HGBA1C 8.3*   CBG: Recent Labs  Lab 08/12/17 2338 08/13/17 0805 08/13/17 1119  GLUCAP 165* 159* 151*   Lipid Profile: No results for input(s): CHOL, HDL, LDLCALC, TRIG, CHOLHDL, LDLDIRECT in the last 72 hours. Thyroid Function Tests: No results for input(s): TSH, T4TOTAL, FREET4, T3FREE, THYROIDAB in the last 72 hours. Anemia Panel: Recent Labs    08/12/17 1856  VITAMINB12 557  FOLATE 63.6  FERRITIN 11  TIBC 393  IRON 18*   Sepsis Labs: No results for input(s): PROCALCITON, LATICACIDVEN in the last 168 hours.  No results found for this or any previous visit (from the past 240 hour(s)).       Radiology Studies: No results found.      Scheduled Meds: . [MAR Hold] carvedilol  6.25 mg Oral BID WC  . [MAR Hold] folic acid  1 mg  Oral Daily  . [MAR Hold] gabapentin  600 mg Oral TID  . [MAR Hold] insulin aspart  0-5 Units Subcutaneous QHS  . [MAR Hold] insulin aspart  0-9 Units Subcutaneous TID WC  . [MAR Hold] pantoprazole  40 mg Oral QAC breakfast  . [MAR Hold] rOPINIRole  1 mg Oral TID   Continuous Infusions: . sodium chloride 125 mL/hr at 08/13/17 0927  . sodium chloride       LOS: 0 days    Time spent: over 20 min    Fayrene Helper, MD [facility] Pager [phone]  If 7PM-7AM, please contact night-coverage [email or web address] Password [facility] 08/13/2017, 3:25 PM

## 2017-08-13 NOTE — Progress Notes (Signed)
Pt's BP 115/48, HR 77. Pt asymptomatic. 1700 Coreg not given. Dr. Florene Glen at bedside and updated. Will continue to monitor.

## 2017-08-13 NOTE — Plan of Care (Signed)
Pt to be discharged tomorrow morning per Dr. Florene Glen. Pt aware of plan of care.

## 2017-08-13 NOTE — Care Management Obs Status (Signed)
[place] NOTIFICATION   Patient Details  Name: Wanda Curry MRN: [id number] Date of Birth: Sep 06, 1945   Medicare Observation Status Notification Given:  Yes    Sherald Barge, RN 08/13/2017, 9:10 AM

## 2017-08-13 NOTE — Op Note (Signed)
[facility] Patient Name: Wanda Curry Procedure Date: 08/13/2017 2:38 PM MRN: [id number] Date of Birth: 28-Oct-1945 Attending MD: Norvel Richards , MD CSN: [id number] Age: 71 Admit Type: Outpatient Procedure:                Colonoscopy Indications:              Hematochezia Providers:                Norvel Richards, MD, Rosina Lowenstein, RN, Nelma Rothman, Technician, Jeanann Lewandowsky. Sharon Seller, RN Referring MD:              Medicines:                Midazolam 3 mg IV, Meperidine 75 mg IV, Ondansetron                            4 mg IV Complications:            No immediate complications. Estimated Blood Loss:     Estimated blood loss: none. Procedure:                Pre-Anesthesia Assessment:                           - Prior to the procedure, a History and Physical                            was performed, and patient medications and                            allergies were reviewed. The patient's tolerance of                            previous anesthesia was also reviewed. The risks                            and benefits of the procedure and the sedation                            options and risks were discussed with the patient.                            All questions were answered, and informed consent                            was obtained. Prior Anticoagulants: The patient                            last took Xarelto (rivaroxaban) 2 days prior to the                            procedure. ASA Grade Assessment: III - A patient  with severe systemic disease. After reviewing the                            risks and benefits, the patient was deemed in                            satisfactory condition to undergo the procedure.                           After obtaining informed consent, the colonoscope                            was passed under direct vision. Throughout the                            procedure, the  patient's blood pressure, pulse, and                            oxygen saturations were monitored continuously. The                            EC-[id number] ([id number]) scope was introduced through                            the anus and advanced to the the ileocolonic                            anastomosis. The terminal ileum and the rectum were                            photographed. The quality of the bowel preparation                            was adequate. The terminal ileum and the rectum                            were photographed. Scope In: 3:05:34 PM Scope Out: 3:14:28 PM Total Procedure Duration: 0 hours 8 minutes 54 seconds  Findings:      The perianal and digital rectal examinations were normal.      Scattered small and large-mouthed diverticula were found in the entire       colon. Status post right hemicolectomy. Normal-appearing anastomosis. No       blood in the lower GI tract.      No additional abnormalities were found on retroflexion. Impression:               - Diverticulosis in the entire examined colon.                            hemoglobin has remained stable in the 9.5 range. I                            suspect a self-limiting divertcular bleed                           -  No specimens collected. Moderate Sedation:      Moderate (conscious) sedation was administered by the endoscopy nurse       and supervised by the endoscopist. The following parameters were       monitored: oxygen saturation, heart rate, blood pressure, respiratory       rate, EKG, adequacy of pulmonary ventilation, and response to care.       Total physician intraservice time was 14 minutes. Recommendation:           - Patient has a contact number available for                            emergencies. The signs and symptoms of potential                            delayed complications were discussed with the                            patient. Return to normal activities tomorrow.                             Written discharge instructions were provided to the                            patient.                           - Advance diet as tolerated today.                           - Diabetic (ADA) diet.                           - Continue present medications.                           - Repeat colonoscopy in 3 years for surveillance.                           - Return to GI clinic (date not yet determined).                            Resume Xarelto on December 31. If recurrent                            bleeding, further GI evaluation would be warranted. Procedure Code(s):        --- Professional ---                           [phone], Colonoscopy, flexible; diagnostic, including                            collection of specimen(s) by brushing or washing,                            when performed (separate procedure)  02637, Moderate sedation services provided by the                            same physician or other qualified health care                            professional performing the diagnostic or                            therapeutic service that the sedation supports,                            requiring the presence of an independent trained                            observer to assist in the monitoring of the                            patient's level of consciousness and physiological                            status; initial 15 minutes of intraservice time,                            patient age 63 years or older Diagnosis Code(s):        --- Professional ---                           K92.1, Melena (includes Hematochezia)                           K57.30, Diverticulosis of large intestine without                            perforation or abscess without bleeding CPT copyright 2016 [employer]. All rights reserved. The codes documented in this report are preliminary and upon coder review may  be revised to meet current  compliance requirements. Cristopher Estimable. Azile Minardi, MD Norvel Richards, MD 08/13/2017 3:28:25 PM This report has been signed electronically. Number of Addenda: 0

## 2017-08-14 DIAGNOSIS — Z794 Long term (current) use of insulin: Secondary | ICD-10-CM

## 2017-08-14 DIAGNOSIS — C189 Malignant neoplasm of colon, unspecified: Secondary | ICD-10-CM

## 2017-08-14 DIAGNOSIS — Z86711 Personal history of pulmonary embolism: Secondary | ICD-10-CM | POA: Diagnosis not present

## 2017-08-14 DIAGNOSIS — K5791 Diverticulosis of intestine, part unspecified, without perforation or abscess with bleeding: Secondary | ICD-10-CM | POA: Diagnosis not present

## 2017-08-14 DIAGNOSIS — D649 Anemia, unspecified: Secondary | ICD-10-CM | POA: Diagnosis not present

## 2017-08-14 DIAGNOSIS — E1169 Type 2 diabetes mellitus with other specified complication: Secondary | ICD-10-CM | POA: Diagnosis not present

## 2017-08-14 DIAGNOSIS — K922 Gastrointestinal hemorrhage, unspecified: Secondary | ICD-10-CM | POA: Diagnosis not present

## 2017-08-14 LAB — HEMATOCRIT
HCT: 29.5 % — ABNORMAL LOW (ref 36.0–46.0)
HCT: 30 % — ABNORMAL LOW (ref 36.0–46.0)

## 2017-08-14 LAB — GLUCOSE, CAPILLARY
Glucose-Capillary: 173 mg/dL — ABNORMAL HIGH (ref 65–99)
Glucose-Capillary: 242 mg/dL — ABNORMAL HIGH (ref 65–99)

## 2017-08-14 LAB — HEMOGLOBIN
Hemoglobin: 9 g/dL — ABNORMAL LOW (ref 12.0–15.0)
Hemoglobin: 9 g/dL — ABNORMAL LOW (ref 12.0–15.0)

## 2017-08-14 MED ORDER — RIVAROXABAN 20 MG PO TABS: 20.0000 mg | ORAL_TABLET | Freq: Every day | ORAL | 0 refills | Status: DC

## 2017-08-14 MED ORDER — ACETAMINOPHEN 325 MG PO TABS
650.0000 mg | ORAL_TABLET | Freq: Four times a day (QID) | ORAL | Status: DC | PRN
  Administered 2017-08-14: 650 mg via ORAL
  Filled 2017-08-14: qty 2

## 2017-08-14 NOTE — Progress Notes (Signed)
Inpatient Diabetes Program Recommendations  AACE/ADA: New Consensus Statement on Inpatient Glycemic Control (2015)  Target Ranges:  Prepandial:   less than 140 mg/dL      Peak postprandial:   less than 180 mg/dL (1-2 hours)      Critically ill patients:  140 - 180 mg/dL   Results for MIROSLAVA, SANTELLAN (MRN [id number]) as of 08/14/2017 12:32  Ref. Range 08/13/2017 08:05 08/13/2017 11:19 08/13/2017 14:21 08/13/2017 16:35 08/13/2017 21:09 08/14/2017 07:58 08/14/2017 11:24  Glucose-Capillary Latest Ref Range: 65 - 99 mg/dL 159 (H) 151 (H) 107 (H) 106 (H) 211 (H) 173 (H) 242 (H)   Review of Glycemic Control  Diabetes history: DM2 Outpatient Diabetes medications: Basaglar 42 units QAM, 70/30 30 units BID (with breakfast and supper) Current orders for Inpatient glycemic control: Novolog 0-9 units TID with meals, Novolog 0-5 units QHS  Inpatient Diabetes Program Recommendations: Insulin - Meal Coverage: While inpatient, please consider ordering Novolog 3 units TID with meals for meal coverage if patient eats at least 50% of meals. Insulin - Basal: If CBGs become consistently elevated (greater than 180 mg/dl despite Novolog correction and recommended meal coverage), may need to order basal insulin while inpatient.  Thanks, Barnie Alderman, RN, MSN, CDE Diabetes Coordinator Inpatient Diabetes Program [phone] (Team Pager from 8am to 5pm)

## 2017-08-14 NOTE — Discharge Summary (Signed)
Physician Discharge Summary  Wanda Curry HBZ:[id number] DOB: [birth year] DOA: 08/12/2017  PCP: Earney Mallet, MD  Admit date: 08/12/2017 Discharge date: 08/14/2017  Admitted From: Home Disposition: Home  Recommendations for Outpatient Follow-up:  1. Follow up with PCP in 1-2 weeks 2. Outpatient follow-up with gastroenterology 3. Please obtain BMP/CBC in one week  Home Health: None Equipment/Devices: None  Discharge Condition: Stable CODE STATUS: Full code Diet recommendation: Heart healthy carb modified diet  Brief/Interim Summary: Wanda Curry a 71 y.o.femalewith medical history significant ofcolon cancer status post right hemicolectomy, anemia, rheumatoid arthritis, PE on Xarelto,type 2 diabetes presenting with bright red blood per rectum.   Patient was admitted to the hospital and gastroenterology was consulted.  Patient underwent colonoscopy on 08/13/2017.  Results of colonoscopy were The perianal and digital rectal examinations were normal. Scattered small and large-mouthed diverticula were found in the entire colon. Status post right hemicolectomy. Normal-appearing anastomosis. No blood in the lower GI tract. No additional abnormalities were found on retroflexion.  The patient's hemoglobin remained stable and she was stable for discharge on 08/14/2017.  She was told to resume her Xarelto on 08/17/2017.  Outpatient follow-up with gastroenterology was scheduled.  She was encouraged to see her primary care physician within the next week to 2 weeks after discharge.  Discharge Diagnoses:  Principal Problem:   GI bleed Active Problems:   Colon cancer (HCC)   History of pulmonary embolism   Type 2 diabetes mellitus (HCC)   Rheumatoid arthritis (HCC)   RLS (restless legs syndrome)   HTN (hypertension)    Discharge Instructions  Discharge Instructions    Call MD for:  difficulty breathing, headache or visual disturbances   Complete by:  As directed    Call  MD for:  extreme fatigue   Complete by:  As directed    Call MD for:  hives   Complete by:  As directed    Call MD for:  persistant dizziness or light-headedness   Complete by:  As directed    Call MD for:  persistant nausea and vomiting   Complete by:  As directed    Call MD for:  severe uncontrolled pain   Complete by:  As directed    Call MD for:  temperature >100.4   Complete by:  As directed    Diet - low sodium heart healthy   Complete by:  As directed    Discharge instructions   Complete by:  As directed    Zoom Xarelto on 08/17/2017 Follow-up with GI Follow-up with primary care within the next week or 2   Increase activity slowly   Complete by:  As directed      Allergies as of 08/14/2017      Reactions   Adhesive [tape]    Adhesive tape and band-aids cause skin irritation   Sulfa Antibiotics Nausea Only, Other (See Comments)   Joint paint   Erythromycin Itching, Rash   burning      Medication List    TAKE these medications   atorvastatin 20 MG tablet Commonly known as:  LIPITOR Take 20 mg by mouth every morning.   BASAGLAR KWIKPEN [place] Inject 42 Units into the skin every morning.   Calcium + D3 600-200 MG-UNIT Tabs Take 1 tablet by mouth 2 (two) times daily.   carvedilol 6.25 MG tablet Commonly known as:  COREG Take 6.25 mg by mouth 2 (two) times daily with a meal.   CENTRUM SILVER PO Take 1 tablet by mouth daily.  [employer] PAIN RELIEF EXTRA STRENGTH 500 MG tablet Generic drug:  acetaminophen Take 500 mg by mouth every 6 (six) hours as needed (for pain.).   folic acid 1 MG tablet Commonly known as:  FOLVITE Take 1 mg by mouth daily.   furosemide 40 MG tablet Commonly known as:  LASIX Take 40 mg daily by mouth.   gabapentin 600 MG tablet Commonly known as:  NEURONTIN Take 600 mg by mouth 2 (two) times daily. MAY TAKE 3RD CAPSULE IF NEEDED   GLUCOSAMINE-CHONDROITIN PO Take 1 tablet by mouth 2 (two) times daily.   KLOR-CON M20 20 MEQ  tablet Generic drug:  potassium chloride SA Take 20 mEq by mouth daily.   magnesium gluconate 30 MG tablet Commonly known as:  MAGONATE Take 30 mg as needed by mouth.   methotrexate 2.5 MG tablet Commonly known as:  RHEUMATREX Take 20 mg by mouth every Sunday. Caution:Chemotherapy. Protect from light.   NOVOLOG MIX 70/30 FLEXPEN (70-30) 100 UNIT/ML FlexPen Generic drug:  insulin aspart protamine - aspart INJECT 30 UNITS SUBCUTANEOUSLY TWICE DAILY AT BREAKFAST AND DINNER   omeprazole 40 MG capsule Commonly known as:  PRILOSEC TAKE ONE CAPSULE BY MOUTH ONCE A DAY   rivaroxaban 20 MG Tabs tablet Commonly known as:  XARELTO Take 1 tablet (20 mg total) by mouth daily with supper. Resume on 12/31 What changed:  additional instructions   rOPINIRole 1 MG tablet Commonly known as:  REQUIP Take 1 mg by mouth 2 (two) times daily. MAY TAKE 3RD TABLET AS NEEDED   SAVELLA 100 MG Tabs tablet Generic drug:  Milnacipran HCl Take 100 mg by mouth 2 (two) times daily.   Vitamin D (Ergocalciferol) 50000 units Caps capsule Commonly known as:  DRISDOL Take 50,000 Units by mouth every Wednesday.       Allergies  Allergen Reactions  . Adhesive [Tape]     Adhesive tape and band-aids cause skin irritation  . Sulfa Antibiotics Nausea Only and Other (See Comments)    Joint paint  . Erythromycin Itching and Rash    burning    Consultations:  Gastroenterology    Procedures/Studies:  No results found. Colonoscopy:The perianal and digital rectal examinations were normal.      Scattered small and large-mouthed diverticula were found in the entire       colon. Status post right hemicolectomy. Normal-appearing anastomosis. No       blood in the lower GI tract.      No additional abnormalities were found on retroflexion.    Subjective: Patient seen this morning.  She states she is anxious to go home.  She denies any abdominal pain or any dark black stool or bright red blood per rectum.   All questions answered.  She already has an outpatient follow-up with her primary care physician.  Discharge Exam: Vitals:   08/14/17 0700 08/14/17 1024  BP: (!) 165/69 109/69  Pulse: (!) 127 (!) 108  Resp: 16   Temp: 99 F (37.2 C)   SpO2: 95%    Vitals:   08/13/17 1700 08/13/17 2104 08/14/17 0700 08/14/17 1024  BP: (!) 115/48 (!) 134/49 (!) 165/69 109/69  Pulse: 77 95 (!) 127 (!) 108  Resp: 18 15 16    Temp: 98.2 F (36.8 C) 98.6 F (37 C) 99 F (37.2 C)   TempSrc: Oral Oral    SpO2: 97% 92% 95%   Weight:      Height:        General: Pt is alert,  awake, not in acute distress Cardiovascular: RRR, S1/S2 +, no rubs, no gallops Respiratory: CTA bilaterally, no wheezing, no rhonchi Abdominal: Soft, NT, ND, bowel sounds + Extremities: Nonpitting edema bilaterally    The results of significant diagnostics from this hospitalization (including imaging, microbiology, ancillary and laboratory) are listed below for reference.     Microbiology: No results found for this or any previous visit (from the past 240 hour(s)).   Labs: BNP (last 3 results) No results for input(s): BNP in the last 8760 hours. Basic Metabolic Panel: Recent Labs  Lab 08/12/17 1354 08/13/17 0254  NA 137 142  K 3.9 4.1  CL 100* 105  CO2 27 27  GLUCOSE 302* 144*  BUN 21* 17  CREATININE 1.15* 0.89  CALCIUM 9.2 8.9   Liver Function Tests: Recent Labs  Lab 08/12/17 1354 08/13/17 0254  AST 28 23  ALT 27 23  ALKPHOS 63 55  BILITOT 0.5 0.5  PROT 7.2 6.2*  ALBUMIN 3.4* 2.9*   No results for input(s): LIPASE, AMYLASE in the last 168 hours. No results for input(s): AMMONIA in the last 168 hours. CBC: Recent Labs  Lab 08/12/17 1354  08/13/17 0254 08/13/17 1032 08/13/17 1928 08/14/17 0227 08/14/17 1021  WBC 8.4  --   --   --   --   --   --   NEUTROABS 5.9  --   --   --   --   --   --   HGB 9.9*   < > 9.2* 9.5* 9.0* 9.0* 9.0*  HCT 32.2*   < > 29.4* 31.0* 29.1* 29.5* 30.0*  MCV 83.0  --    --   --   --   --   --   PLT 304  --   --   --   --   --   --    < > = values in this interval not displayed.   Cardiac Enzymes: No results for input(s): CKTOTAL, CKMB, CKMBINDEX, TROPONINI in the last 168 hours. BNP: Invalid input(s): POCBNP CBG: Recent Labs  Lab 08/13/17 1421 08/13/17 1635 08/13/17 2109 08/14/17 0758 08/14/17 1124  GLUCAP 107* 106* 211* 173* 242*   D-Dimer No results for input(s): DDIMER in the last 72 hours. Hgb A1c Recent Labs    08/12/17 1856  HGBA1C 8.3*   Lipid Profile No results for input(s): CHOL, HDL, LDLCALC, TRIG, CHOLHDL, LDLDIRECT in the last 72 hours. Thyroid function studies No results for input(s): TSH, T4TOTAL, T3FREE, THYROIDAB in the last 72 hours.  Invalid input(s): FREET3 Anemia work up Recent Labs    08/12/17 1856  VITAMINB12 557  FOLATE 63.6  FERRITIN 11  TIBC 393  IRON 18*   Urinalysis    Component Value Date/Time   COLORURINE STRAW (A) 08/12/2017 1323   APPEARANCEUR CLEAR 08/12/2017 1323   LABSPEC 1.006 08/12/2017 1323   PHURINE 5.0 08/12/2017 1323   GLUCOSEU >=500 (A) 08/12/2017 1323   HGBUR MODERATE (A) 08/12/2017 1323   BILIRUBINUR NEGATIVE 08/12/2017 1323   KETONESUR NEGATIVE 08/12/2017 1323   PROTEINUR NEGATIVE 08/12/2017 1323   NITRITE NEGATIVE 08/12/2017 1323   LEUKOCYTESUR LARGE (A) 08/12/2017 1323   Sepsis Labs Invalid input(s): PROCALCITONIN,  WBC,  LACTICIDVEN Microbiology No results found for this or any previous visit (from the past 240 hour(s)).   Time coordinating discharge: Over 30 minutes  SIGNED:   Loretha Stapler, MD  [facility] 08/14/2017, 12:36 PM Pager [phone] If 7PM-7AM, please contact night-coverage [email or web address] Password [email or web address]

## 2017-08-14 NOTE — Progress Notes (Signed)
Pt discharged home today per Dr. Adair Patter. Pt's IV site D/C'd and WDL. Pt's VSS. Pt provided with home medication list and discharge instructions. Verbalized understanding. Pt left floor via WC in stable condition accompanied by RN.

## 2017-08-20 ENCOUNTER — Telehealth: Payer: Self-pay | Admitting: Gastroenterology

## 2017-08-20 ENCOUNTER — Encounter: Payer: Self-pay | Admitting: *Deleted

## 2017-08-20 NOTE — Telephone Encounter (Signed)
RECALL FOR CT CHEST ?

## 2017-08-20 NOTE — Telephone Encounter (Signed)
Recall letter mailed to pt.  

## 2017-08-24 ENCOUNTER — Encounter (HOSPITAL_COMMUNITY): Payer: Self-pay | Admitting: Internal Medicine

## 2017-09-08 ENCOUNTER — Encounter (HOSPITAL_COMMUNITY)
Admission: RE | Admit: 2017-09-08 | Discharge: 2017-09-08 | Disposition: A | Discharge status: Home / Self Care | Payer: Medicare Other | Source: Ambulatory Visit | Attending: Oncology | Admitting: Oncology

## 2017-09-08 DIAGNOSIS — C182 Malignant neoplasm of ascending colon: Secondary | ICD-10-CM | POA: Insufficient documentation

## 2017-09-08 DIAGNOSIS — D649 Anemia, unspecified: Secondary | ICD-10-CM | POA: Diagnosis present

## 2017-09-08 DIAGNOSIS — C189 Malignant neoplasm of colon, unspecified: Secondary | ICD-10-CM | POA: Diagnosis present

## 2017-09-08 LAB — GLUCOSE, CAPILLARY: Glucose-Capillary: 151 mg/dL — ABNORMAL HIGH (ref 65–99)

## 2017-09-08 MED ORDER — FLUDEOXYGLUCOSE F - 18 (FDG) INJECTION
12.3900 | Freq: Once | INTRAVENOUS | Status: AC | PRN
  Administered 2017-09-08: 12.39 via INTRAVENOUS

## 2017-09-10 ENCOUNTER — Other Ambulatory Visit: Payer: Self-pay

## 2017-09-10 ENCOUNTER — Inpatient Hospital Stay (HOSPITAL_COMMUNITY): Payer: Medicare Other | Attending: Oncology

## 2017-09-10 ENCOUNTER — Inpatient Hospital Stay (HOSPITAL_BASED_OUTPATIENT_CLINIC_OR_DEPARTMENT_OTHER): Payer: Medicare Other | Admitting: Internal Medicine

## 2017-09-10 ENCOUNTER — Encounter (HOSPITAL_COMMUNITY): Payer: Self-pay | Admitting: Internal Medicine

## 2017-09-10 VITALS — BP 165/66 | HR 97 | Temp 98.2°F | Resp 18 | Ht 65.5 in | Wt 250.0 lb

## 2017-09-10 DIAGNOSIS — D509 Iron deficiency anemia, unspecified: Secondary | ICD-10-CM | POA: Insufficient documentation

## 2017-09-10 DIAGNOSIS — C189 Malignant neoplasm of colon, unspecified: Secondary | ICD-10-CM | POA: Insufficient documentation

## 2017-09-10 DIAGNOSIS — J320 Chronic maxillary sinusitis: Secondary | ICD-10-CM

## 2017-09-10 DIAGNOSIS — M069 Rheumatoid arthritis, unspecified: Secondary | ICD-10-CM | POA: Insufficient documentation

## 2017-09-10 DIAGNOSIS — D649 Anemia, unspecified: Secondary | ICD-10-CM

## 2017-09-10 DIAGNOSIS — Z7901 Long term (current) use of anticoagulants: Secondary | ICD-10-CM | POA: Insufficient documentation

## 2017-09-10 DIAGNOSIS — K769 Liver disease, unspecified: Secondary | ICD-10-CM

## 2017-09-10 DIAGNOSIS — N183 Chronic kidney disease, stage 3 (moderate): Secondary | ICD-10-CM

## 2017-09-10 DIAGNOSIS — Z79899 Other long term (current) drug therapy: Secondary | ICD-10-CM | POA: Insufficient documentation

## 2017-09-10 DIAGNOSIS — C182 Malignant neoplasm of ascending colon: Secondary | ICD-10-CM

## 2017-09-10 DIAGNOSIS — Z86711 Personal history of pulmonary embolism: Secondary | ICD-10-CM | POA: Insufficient documentation

## 2017-09-10 LAB — BASIC METABOLIC PANEL
Anion gap: 10 (ref 5–15)
BUN: 17 mg/dL (ref 6–20)
CO2: 27 mmol/L (ref 22–32)
Calcium: 9.3 mg/dL (ref 8.9–10.3)
Chloride: 98 mmol/L — ABNORMAL LOW (ref 101–111)
Creatinine, Ser: 0.95 mg/dL (ref 0.44–1.00)
GFR calc Af Amer: >60 mL/min (ref >60)
GFR calc non Af Amer: 58 mL/min — ABNORMAL LOW (ref >60)
Glucose, Bld: 227 mg/dL — ABNORMAL HIGH (ref 65–99)
Potassium: 4.9 mmol/L (ref 3.5–5.1)
Sodium: 135 mmol/L (ref 135–145)

## 2017-09-10 LAB — CBC WITH DIFFERENTIAL/PLATELET
Basophils Absolute: 0 10*3/uL (ref 0.0–0.1)
Basophils Relative: 0 %
Eosinophils Absolute: 0.3 10*3/uL (ref 0.0–0.7)
Eosinophils Relative: 2 %
HCT: 31.6 % — ABNORMAL LOW (ref 36.0–46.0)
Hemoglobin: 9.7 g/dL — ABNORMAL LOW (ref 12.0–15.0)
Lymphocytes Relative: 25 %
Lymphs Abs: 2.6 10*3/uL (ref 0.7–4.0)
MCH: 24.8 pg — ABNORMAL LOW (ref 26.0–34.0)
MCHC: 30.7 g/dL (ref 30.0–36.0)
MCV: 80.8 fL (ref 78.0–100.0)
Monocytes Absolute: 1 10*3/uL (ref 0.1–1.0)
Monocytes Relative: 9 %
Neutro Abs: 6.6 10*3/uL (ref 1.7–7.7)
Neutrophils Relative %: 64 %
Platelets: 288 10*3/uL (ref 150–400)
RBC: 3.91 MIL/uL (ref 3.87–5.11)
RDW: 18.5 % — ABNORMAL HIGH (ref 11.5–15.5)
WBC: 10.4 10*3/uL (ref 4.0–10.5)

## 2017-09-10 NOTE — Progress Notes (Signed)
[place] progress note:   CHIEF COMPLAINT:  T3N0 colon cancer - high risk.  Indeetrminate liver and lung lesions- being closely followed. Iron deficiency anemia H/o PE   ONCOLOGIC HISTORY:   Colon cancer ([facility])   10/10/2016 Initial Diagnosis    Colon cancer ([facility])      10/10/2016 Surgery    Partial colectomy by Dr. Arnoldo Morale  Pathology shows  a 2.1 cm grade 2 adenocarcinoma of the ascending colon with invasion through the muscularis propria into the peri-colorectal tissues.         11/17/2016 PET scan    The hepatic lesion seen on the CT scan and MRI does not demonstrate hypermetabolism and is likely a benign entity.  Right maxillary sinus disease with associated hypermetabolism.  Scattered pulmonary nodules, likely benign. No follow-up needed if patient is low-risk (and has no known or suspected primary neoplasm). Non-contrast chest CT can be considered in 12 months if patient is high-risk.       12/25/2016 - 02/04/2017 Adjuvant Chemotherapy    Xeloda 2000mg  PO BID take for 14 days out of 21 days. Plan for total of 24 weeks of treatment.       HISTORY OF PRESENTING ILLNESS:  Wanda Curry is a 72 y.o. female who returns for follow up of colon cancer. Pathology results of the colon flexure mass were consistent with adenocarcinoma. CT of the abdomen and pelvis on 10/08/2016 showed a 3.2 cm indeterminate low attenuation mass in the central liver. Patient was seen by Dr. Arnoldo Morale and had a partial colectomy on 10/10/2016. Pathology shows  a 2.1 cm grade 2 adenocarcinoma of the ascending colon with invasion through the muscularis propria into the peri-colorectal tissues.    Interval history: Returns for follow up, has been having nose bleeds at night, small amounts, both nostrils, awakens with dry blood in nostrils No other sites of bleeding. Remains on Xarelto.  She states her bowel movements are fine. No melena, hematochezia. Denies any weight loss,  change in appetite. Feels tired  Constantly. Has been having increase in pain in multiple joints due to rheumatoid arthritis, seeing Rheumatology She has swelling on her legs. She denies any chest pain, shortness of breath, appetite changes, abdominal pain, focal weakness.    PHYSICAL EXAMINATION: ECOG PERFORMANCE STATUS: 1 - Symptomatic but completely ambulatory  Vitals:   09/10/17 1144  BP: (!) 165/66  Pulse: 97  Resp: 18  Temp: 98.2 F (36.8 C)  SpO2: 95%   Filed Weights   09/10/17 1144  Weight: 250 lb (113.4 kg)   .Body mass index is 40.97 kg/m.   Physical Exam  Constitutional: She is oriented to person, place, and time and well-developed, well-nourished, and in no distress.  HENT: Normal, no sinus tenderness,  Nasal cavity shows dry mucosa bilaterally, no active visible bleeding Head: Normocephalic and atraumatic.   Neck: Normal range of motion. Neck supple.  Cardiovascular: Normal rate, regular rhythm Pulmonary/Chest: Effort normal  Abdominal: Soft. Bowel sounds are normal.  Musculoskeletal:fusiform swelling of the finger joints Neurological: She is alert and oriented to person, place, and time. Gait normal.  Skin: Skin is warm and dry. No rash noted. No erythema.  Psychiatric: Mood, memory, affect and judgment normal.   LABORATORY DATA:  I have reviewed the data as listed  CBC Latest Ref Rng & Units 09/10/2017 08/14/2017 08/14/2017  WBC 4.0 - 10.5 K/uL 10.4 - -  Hemoglobin 12.0 - 15.0 g/dL 9.7(L) 9.0(L) 9.0(L)  Hematocrit 36.0 - 46.0 %  31.6(L) 30.0(L) 29.5(L)  Platelets 150 - 400 K/uL 288 - -   CMP Latest Ref Rng & Units 09/10/2017 08/13/2017 08/12/2017  Glucose 65 - 99 mg/dL 227(H) 144(H) 302(H)  BUN 6 - 20 mg/dL 17 17 21(H)  Creatinine 0.44 - 1.00 mg/dL 0.95 0.89 1.15(H)  Sodium 135 - 145 mmol/L 135 142 137  Potassium 3.5 - 5.1 mmol/L 4.9 4.1 3.9  Chloride 101 - 111 mmol/L 98(L) 105 100(L)  CO2 22 - 32 mmol/L 27 27 27   Calcium 8.9 - 10.3 mg/dL 9.3 8.9 9.2    Total Protein 6.5 - 8.1 g/dL - 6.2(L) 7.2  Total Bilirubin 0.3 - 1.2 mg/dL - 0.5 0.5  Alkaline Phos 38 - 126 U/L - 55 63  AST 15 - 41 U/L - 23 28  ALT 14 - 54 U/L - 23 27   PATHOLOGY:      Colonoscopy 09/23/2016 - One 25 mm, recently bleeding polyp in the proximal ascending colon, NOT REMOVED. - Heme positive stools/hematichezia due to malignant tumor at the hepatic flexure AND COLON POLYPS. Biopsied. - Diverticulosis in the sigmoid colon and in the descending colon.   PET/CT 09/08/17:  Peripheral right middle lobe 7 mm solid pulmonary nodule (series 8/image 27), below PET resolution, not associated with significant metabolism, stable in size since 11/17/2016 PET-CT using similar measurement technique. Additional previously described tiny 2-3 mm pulmonary nodules in the right middle lobe medially and in the bilateral upper lobes are unchanged and considered benign. No new significant pulmonary nodules.  There is a non hypermetabolic 3.3 x 2.9 cm central right liver lobe mass (series 4/image 687), which measured 3.3 x 2.8 cm on 11/17/2016 PET-CT using similar measurement technique, unchanged in size. No hypermetabolic liver masses.   ASSESSMENT & PLAN:  Stage IIA colon adenocarcinoma, high risk disease in the presence of lymphovascular invasion and high grade (poorly differentiated) tumor. Patient has opted for adjuvant treatment with Xeloda 1000 mg/m2 PO qBID 1-14 every 3 weeks x 24 weeks. She started taking her Xeloda on 12/25/16. Patient took xeloda up to cycle 2 day 13, however discontinued the chemo due to intolerable side effects including debilitating fatigue, hand-foot syndrome, mucositis, and diarrhea.   CBC shows a hemoglobin 9.7 platelets 288 white cell count 10.4 today.  Hemoglobin has been at 9.9 from December 26  Iron saturation at that time showed 5% ferritin was 11, B12 was 557, folate was normal. Kidney function is abnormal with stage III chronic kidney  disease back in December but that has improved to a creatinine at 1.95 today.  CEA last visit was 3.8 CA-19-9 was elevated at 42 back in July 2018  I reviewed the PET scan with the patient and compared with the previous CT scan, the liver lesion shows no change, and has no metabolic activity. Lung lesions are stable. Previously, an MRI of this area shows mixed uptake of indeterminate significance.  I explained to her that the only way to rule out malignancy is to biopsy the lesion, which she is reluctant at this time. She has been accompanied with her daughter and demonstrated full understanding.  Anemia due to Iron deficiency, likely form intermittent diverticular bleeding . Cannot tolerate oral iron.  Repeat colonoscopy from December 2018 showed diverticulosis in the entire examined colon- self-limiting diverticular bleed was suspected, normal-appearing anastomosis  Start IV Injectafer.   Return in 8 weeks  repeat CBC CMP CEA and iron panel.    Rheumatoid arthritis on methotrexate, poorly controlled symptoms seeing rheumatologist  out of [facility] Patient states that he plans a change of therapy soon.  Nose bleeds likely due to atrophic mucosa due to long term use of Flonase. PET scan showing bilateral chronic maxillary sinusitis. Patient declined ENT referral wants to use normal saline spray. Asked to stop Flonase.  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    Addendum: 10/02/17 In reviewing the chart again, and in the absence of a biopsy, the best way to confirm stability of liver lesion is a repeat MRI. I will go ahead and arrange for this before her next visit here in 8 weeks. Will let scheduling know and will also contact the patient.

## 2017-09-11 LAB — CEA: CEA: 3.4 ng/mL (ref 0.0–4.7)

## 2017-09-14 ENCOUNTER — Other Ambulatory Visit: Payer: Self-pay

## 2017-09-14 ENCOUNTER — Inpatient Hospital Stay (HOSPITAL_COMMUNITY): Payer: Medicare Other | Attending: Internal Medicine

## 2017-09-14 ENCOUNTER — Encounter (HOSPITAL_COMMUNITY): Payer: Self-pay

## 2017-09-14 VITALS — BP 133/45 | HR 78 | Temp 98.1°F | Resp 20

## 2017-09-14 DIAGNOSIS — N183 Chronic kidney disease, stage 3 (moderate): Secondary | ICD-10-CM | POA: Insufficient documentation

## 2017-09-14 DIAGNOSIS — K5791 Diverticulosis of intestine, part unspecified, without perforation or abscess with bleeding: Secondary | ICD-10-CM

## 2017-09-14 DIAGNOSIS — D509 Iron deficiency anemia, unspecified: Secondary | ICD-10-CM | POA: Diagnosis not present

## 2017-09-14 MED ORDER — SODIUM CHLORIDE 0.9 % IV SOLN
750.0000 mg | Freq: Once | INTRAVENOUS | Status: AC
  Administered 2017-09-14: 750 mg via INTRAVENOUS
  Filled 2017-09-14: qty 15

## 2017-09-14 MED ORDER — SODIUM CHLORIDE 0.9 % IV SOLN
INTRAVENOUS | Status: AC
  Administered 2017-09-14: 14:00:00 via INTRAVENOUS

## 2017-09-14 NOTE — Progress Notes (Unsigned)
Tolerated infusion w/o adverse reaction.  Alert, in no distress.  VSS.  Discharged ambulatory in c/o family.  

## 2017-09-18 ENCOUNTER — Ambulatory Visit: Payer: Medicare Other | Admitting: Nurse Practitioner

## 2017-09-21 ENCOUNTER — Inpatient Hospital Stay (HOSPITAL_COMMUNITY): Payer: Medicare Other | Attending: Oncology

## 2017-09-21 ENCOUNTER — Encounter (HOSPITAL_COMMUNITY): Payer: Self-pay

## 2017-09-21 VITALS — BP 137/52 | HR 73 | Temp 98.0°F | Resp 18

## 2017-09-21 DIAGNOSIS — D649 Anemia, unspecified: Secondary | ICD-10-CM | POA: Diagnosis present

## 2017-09-21 DIAGNOSIS — K5791 Diverticulosis of intestine, part unspecified, without perforation or abscess with bleeding: Secondary | ICD-10-CM

## 2017-09-21 MED ORDER — SODIUM CHLORIDE 0.9 % IV SOLN
INTRAVENOUS | Status: DC
  Administered 2017-09-21: 13:00:00 via INTRAVENOUS

## 2017-09-21 MED ORDER — SODIUM CHLORIDE 0.9% FLUSH
10.0000 mL | Freq: Once | INTRAVENOUS | Status: AC
  Administered 2017-09-21: 10 mL via INTRAVENOUS

## 2017-09-21 MED ORDER — SODIUM CHLORIDE 0.9 % IV SOLN
750.0000 mg | Freq: Once | INTRAVENOUS | Status: AC
  Administered 2017-09-21: 750 mg via INTRAVENOUS
  Filled 2017-09-21: qty 15

## 2017-09-21 NOTE — Progress Notes (Signed)
To treatment area for iron infusion.  Peripheral IV site clean and dry with good blood return noted.  No complaints of pain at site with flush.    Patient tolerated iron infusion with no complaints voiced.  Peripheral IV site clean and dry with no bruising or swelling noted at site.  Good blood return noted post infusion.  No complaints of pain at site.  Band aid applied.  VSS with discharge and left ambulatory with family with no s/s of distress noted.

## 2017-09-21 NOTE — Patient Instructions (Signed)
[facility] at [facility]  Discharge Instructions:  You received an iron infusion today.  _______________________________________________________________  Thank you for choosing [facility] at [facility] to provide your oncology and hematology care.  To afford each patient quality time with our providers, please arrive at least 15 minutes before your scheduled appointment.  You need to re-schedule your appointment if you arrive 10 or more minutes late.  We strive to give you quality time with our providers, and arriving late affects you and other patients whose appointments are after yours.  Also, if you no show three or more times for appointments you may be dismissed from the clinic.  Again, thank you for choosing  Can[facility] Center at [facility]. Our hope is that these requests will allow you access to exceptional care and in a timely manner. _______________________________________________________________  If you have questions after your visit, please contact our office at [phone] between the hours of 8:30 a.m. and 5:00 p.m. Voicemails left after 4:30 p.m. will not be returned until the following business day. _______________________________________________________________  For prescription refill requests, have your pharmacy contact our office. _______________________________________________________________  Recommendations made by the consultant and any test results will be sent to your referring physician. _______________________________________________________________ 

## 2017-10-06 ENCOUNTER — Other Ambulatory Visit (HOSPITAL_COMMUNITY): Payer: Self-pay | Admitting: Adult Health

## 2017-10-06 DIAGNOSIS — C189 Malignant neoplasm of colon, unspecified: Secondary | ICD-10-CM

## 2017-10-06 DIAGNOSIS — I2699 Other pulmonary embolism without acute cor pulmonale: Secondary | ICD-10-CM

## 2017-11-02 ENCOUNTER — Ambulatory Visit (HOSPITAL_COMMUNITY)
Admission: RE | Admit: 2017-11-02 | Discharge: 2017-11-02 | Disposition: A | Discharge status: Home / Self Care | Payer: Medicare Other | Source: Ambulatory Visit | Attending: Internal Medicine | Admitting: Internal Medicine

## 2017-11-02 DIAGNOSIS — C189 Malignant neoplasm of colon, unspecified: Secondary | ICD-10-CM | POA: Diagnosis present

## 2017-11-02 DIAGNOSIS — K769 Liver disease, unspecified: Secondary | ICD-10-CM | POA: Insufficient documentation

## 2017-11-02 LAB — POCT I-STAT CREATININE: Creatinine, Ser: 1 mg/dL (ref 0.44–1.00)

## 2017-11-02 MED ORDER — GADOBENATE DIMEGLUMINE 529 MG/ML IV SOLN
20.0000 mL | Freq: Once | INTRAVENOUS | Status: AC | PRN
  Administered 2017-11-02: 20 mL via INTRAVENOUS

## 2017-11-04 ENCOUNTER — Other Ambulatory Visit (HOSPITAL_COMMUNITY): Payer: Self-pay | Admitting: *Deleted

## 2017-11-04 DIAGNOSIS — C189 Malignant neoplasm of colon, unspecified: Secondary | ICD-10-CM

## 2017-11-05 ENCOUNTER — Inpatient Hospital Stay (HOSPITAL_COMMUNITY): Payer: Medicare Other

## 2017-11-05 ENCOUNTER — Inpatient Hospital Stay (HOSPITAL_COMMUNITY): Payer: Medicare Other | Attending: Internal Medicine | Admitting: Internal Medicine

## 2017-11-05 ENCOUNTER — Other Ambulatory Visit: Payer: Self-pay

## 2017-11-05 ENCOUNTER — Encounter (HOSPITAL_COMMUNITY): Payer: Self-pay | Admitting: Internal Medicine

## 2017-11-05 VITALS — BP 143/60 | HR 88 | Temp 97.6°F | Resp 18 | Wt 246.5 lb

## 2017-11-05 DIAGNOSIS — Z87891 Personal history of nicotine dependence: Secondary | ICD-10-CM | POA: Diagnosis not present

## 2017-11-05 DIAGNOSIS — Z7901 Long term (current) use of anticoagulants: Secondary | ICD-10-CM | POA: Insufficient documentation

## 2017-11-05 DIAGNOSIS — R918 Other nonspecific abnormal finding of lung field: Secondary | ICD-10-CM | POA: Insufficient documentation

## 2017-11-05 DIAGNOSIS — R160 Hepatomegaly, not elsewhere classified: Secondary | ICD-10-CM

## 2017-11-05 DIAGNOSIS — K769 Liver disease, unspecified: Secondary | ICD-10-CM | POA: Diagnosis not present

## 2017-11-05 DIAGNOSIS — E119 Type 2 diabetes mellitus without complications: Secondary | ICD-10-CM

## 2017-11-05 DIAGNOSIS — D509 Iron deficiency anemia, unspecified: Secondary | ICD-10-CM | POA: Insufficient documentation

## 2017-11-05 DIAGNOSIS — I2699 Other pulmonary embolism without acute cor pulmonale: Secondary | ICD-10-CM | POA: Diagnosis not present

## 2017-11-05 DIAGNOSIS — Z79899 Other long term (current) drug therapy: Secondary | ICD-10-CM | POA: Diagnosis not present

## 2017-11-05 DIAGNOSIS — R911 Solitary pulmonary nodule: Secondary | ICD-10-CM

## 2017-11-05 DIAGNOSIS — C182 Malignant neoplasm of ascending colon: Secondary | ICD-10-CM | POA: Insufficient documentation

## 2017-11-05 DIAGNOSIS — I10 Essential (primary) hypertension: Secondary | ICD-10-CM | POA: Diagnosis not present

## 2017-11-05 DIAGNOSIS — C189 Malignant neoplasm of colon, unspecified: Secondary | ICD-10-CM

## 2017-11-05 LAB — CBC WITH DIFFERENTIAL/PLATELET
Basophils Absolute: 0 10*3/uL (ref 0.0–0.1)
Basophils Relative: 0 %
Eosinophils Absolute: 0.2 10*3/uL (ref 0.0–0.7)
Eosinophils Relative: 3 %
HCT: 39.3 % (ref 36.0–46.0)
Hemoglobin: 12.6 g/dL (ref 12.0–15.0)
Lymphocytes Relative: 25 %
Lymphs Abs: 2.1 10*3/uL (ref 0.7–4.0)
MCH: 29.2 pg (ref 26.0–34.0)
MCHC: 32.1 g/dL (ref 30.0–36.0)
MCV: 91 fL (ref 78.0–100.0)
Monocytes Absolute: 0.6 10*3/uL (ref 0.1–1.0)
Monocytes Relative: 7 %
Neutro Abs: 5.5 10*3/uL (ref 1.7–7.7)
Neutrophils Relative %: 65 %
Platelets: 252 10*3/uL (ref 150–400)
RBC: 4.32 MIL/uL (ref 3.87–5.11)
RDW: 21.5 % — ABNORMAL HIGH (ref 11.5–15.5)
WBC: 8.4 10*3/uL (ref 4.0–10.5)

## 2017-11-05 LAB — COMPREHENSIVE METABOLIC PANEL
ALT: 34 U/L (ref 14–54)
AST: 28 U/L (ref 15–41)
Albumin: 3.5 g/dL (ref 3.5–5.0)
Alkaline Phosphatase: 66 U/L (ref 38–126)
Anion gap: 11 (ref 5–15)
BUN: 20 mg/dL (ref 6–20)
CO2: 28 mmol/L (ref 22–32)
Calcium: 9.4 mg/dL (ref 8.9–10.3)
Chloride: 97 mmol/L — ABNORMAL LOW (ref 101–111)
Creatinine, Ser: 1.1 mg/dL — ABNORMAL HIGH (ref 0.44–1.00)
GFR calc Af Amer: 56 mL/min — ABNORMAL LOW (ref >60)
GFR calc non Af Amer: 49 mL/min — ABNORMAL LOW (ref >60)
Glucose, Bld: 182 mg/dL — ABNORMAL HIGH (ref 65–99)
Potassium: 4.3 mmol/L (ref 3.5–5.1)
Sodium: 136 mmol/L (ref 135–145)
Total Bilirubin: 0.5 mg/dL (ref 0.3–1.2)
Total Protein: 7 g/dL (ref 6.5–8.1)

## 2017-11-05 LAB — FERRITIN: Ferritin: 230 ng/mL (ref 11–307)

## 2017-11-05 LAB — IRON AND TIBC
Iron: 64 ug/dL (ref 28–170)
Saturation Ratios: 23 % (ref 10.4–31.8)
TIBC: 280 ug/dL (ref 250–450)
UIBC: 216 ug/dL

## 2017-11-05 NOTE — Patient Instructions (Addendum)
[place] at [facility] Discharge Instructions  You were seen today by Dr. Walden Field. She discussed how you've been feeling and your concerns. She went over your recent lab results and everything looked good. She went over your recent MRI and the radiologist feels that things are stable at this time. They are still unsure what this is in your liver.  We will discuss a liver biopsy with [place] to determine if a biopsy can be performed. We will repeat scans in July. We will see you back in July labs and follow up.    Thank you for choosing [place] at [facility] to provide your oncology and hematology care.  To afford each patient quality time with our provider, please arrive at least 15 minutes before your scheduled appointment time.   If you have a lab appointment with the [facility] please come in thru the  Main Entrance and check in at the main information desk  You need to re-schedule your appointment should you arrive 10 or more minutes late.  We strive to give you quality time with our providers, and arriving late affects you and other patients whose appointments are after yours.  Also, if you no show three or more times for appointments you may be dismissed from the clinic at the providers discretion.     Again, thank you for choosing [facility].  Our hope is that these requests will decrease the amount of time that you wait before being seen by our physicians.       _____________________________________________________________  Should you have questions after your visit to [facility], please contact our office at [phone] between the hours of 8:30 a.m. and 4:30 p.m.  Voicemails left after 4:30 p.m. will not be returned until the following business day.  For prescription refill requests, have your pharmacy contact our office.       Resources For Cancer Patients and their Caregivers ? American  Cancer Society: Can assist with transportation, wigs, general needs, runs Look Good Feel Better.        334-012-1101 ? Cancer Care: Provides financial assistance, online support groups, medication/co-pay assistance.  1-800-813-HOPE (602)886-5136) ? [facility] Assists [place] cancer patients and their families through emotional , educational and financial support.  253-066-0989 ? [place] DSS Where to apply for food stamps, Medicaid and utility assistance. 251-033-7634 ? [facility]: Transportation to medical appointments. 502-252-0797 ? Social Security Administration: May apply for disability if have a Stage IV cancer. 223-696-1065 (954) 864-5337 ? [employer], Disability and Transit Services: Assists with nutrition, care and transit needs. [place] Support Programs:   > Cancer Support Group  2nd Tuesday of the month 1pm-2pm, Journey Room   > Creative Journey  3rd Tuesday of the month 1130am-1pm, Journey Room

## 2017-11-06 LAB — HEPATITIS B SURFACE ANTIBODY,QUALITATIVE: Hep B S Ab: NONREACTIVE

## 2017-11-06 LAB — HEPATITIS PANEL, ACUTE
HCV Ab: <0.1 s/co ratio (ref 0.0–0.9)
Hep A IgM: NEGATIVE
Hep B C IgM: NEGATIVE
Hepatitis B Surface Ag: NEGATIVE

## 2017-11-06 LAB — CEA: CEA: 4 ng/mL (ref 0.0–4.7)

## 2017-11-06 LAB — HEPATITIS B SURFACE ANTIGEN: Hepatitis B Surface Ag: NEGATIVE

## 2017-11-06 LAB — HEPATITIS B CORE ANTIBODY, TOTAL: Hep B Core Total Ab: NEGATIVE

## 2018-01-04 NOTE — Progress Notes (Signed)
Diagnosis Liver mass - Plan: Hepatitis B surface antibody, Hepatitis B surface antigen, Hepatitis B core antibody, total, Hepatitis C RNA quantitative, Hepatitis panel, acute, NM PET Image Restag (PS) Skull Base To Thigh  Lung nodule - Plan: NM PET Image Restag (PS) Skull Base To Thigh  Staging Cancer Staging Colon cancer [facility]) Staging form: Colon and Rectum, AJCC 8th Edition - Clinical: Stage IIA (cT3, cN0, cM0) - Signed by [name], MD on 11/26/2016   Assessment and Plan:  ) Stage IIA colon adenocarcinoma.  Pt was followed by Dr. Talbert Cage for stage II high risk disease in the presence of lymphovascular invasion and high grade (poorly differentiated) tumor. Patient opted for adjuvant treatment with Xeloda 1000 mg/m2 PO qBID 1-14 every 3 weeks x 24 weeks. She started taking her Xeloda on 12/25/16. Patient took xeloda up to cycle 2 day 13, however discontinued the chemo due to intolerable side effects including debilitating fatigue, hand-foot syndrome, mucositis, and diarrhea.  She had CT CAP done 06/23/2017 that showed  IMPRESSION: Increase in size of the right perihilar liver lesion measuring 3.7 cm in greatest dimension. This is concerning for hepatic metastatic disease.  Stable changes of right hemicolectomy. No other evidence of metastatic disease in the abdomen or pelvis.  Minimal increased in the size of right middle lobe sub pleural soft tissue pulmonary nodule, which now measures 7.3 mm from prior measurement of 6 mm, indeterminate. The other smaller ground-glass pulmonary nodules noted on most recent prior PET-CT are not well seen due to breathing motion artifact.  Pt was recently seen by Dr. Sherrine Maples and she discussed with pt and reviewed the PET scan with the patient and explained  the only way to rule out malignancy is to biopsy the lesion, which she is reluctant at that time.   She had MRI of liver done 11/02/2017 that showed  IMPRESSION: 1. 3.1 cm lesion in the  central right liver remains indeterminate, but the 1 year of imaging stability is reassuring for benign etiology. This lesion showed no hypermetabolism on PET-CT of 09/08/2017. 2. Otherwise unremarkable exam.  Hepatitis labs are negative and will be set up for PET scan in July 2019 for ongoing follow-up.  Will also ask for radiology opinion of imaging and if biopsy feasible.  She will RTC in July to go over results.    2.  Anemia due to Iron deficiency.  Pt reportedly cannot tolerate oral iron.  Labs done 11/05/2017 show WBC 8.4 HB 12.6 plts 252,000.  She will have repeat labs on RTC in July 2019.    Last colonoscopy from December 2018 showed diverticulosis in the entire examined colon- self-limiting diverticular bleed was suspected, normal-appearing anastomosis  3.  HTN.  BP is 143/60.  Follow-up with PCP.    4.  PE.  Pt remains on Xarelto which has been recommended to continue due to malignancy.     Current Status:  Pt is seen today for follow-up.  She is here to go over MRI.      Colon cancer ([place])   10/10/2016 Initial Diagnosis    Colon cancer ([place])      10/10/2016 Surgery    Partial colectomy by Dr. Arnoldo Morale  Pathology shows  a 2.1 cm grade 2 adenocarcinoma of the ascending colon with invasion through the muscularis propria into the peri-colorectal tissues.         11/17/2016 PET scan    The hepatic lesion seen on the CT scan and MRI does not demonstrate hypermetabolism and  is likely a benign entity.  Right maxillary sinus disease with associated hypermetabolism.  Scattered pulmonary nodules, likely benign. No follow-up needed if patient is low-risk (and has no known or suspected primary neoplasm). Non-contrast chest CT can be considered in 12 months if patient is high-risk.       12/25/2016 - 02/04/2017 Adjuvant Chemotherapy    Xeloda '2000mg'$  PO BID take for 14 days out of 21 days. Plan for total of 24 weeks of treatment.        Problem List Patient Active Problem  List   Diagnosis Date Noted  . GI bleed [K92.2] 08/12/2017  . History of pulmonary embolism [Z86.711] 08/12/2017  . Type 2 diabetes mellitus ([place]) [E11.9] 08/12/2017  . Rheumatoid arthritis ([place]) [M06.9] 08/12/2017  . RLS (restless legs syndrome) [G25.81] 08/12/2017  . HTN (hypertension) [I10] 08/12/2017  . Acute pulmonary embolism ([place]) [I26.99] 04/15/2017  . Malignant neoplasm of colon ([place]) [C18.9] 11/26/2016  . Colon cancer ([place]) [C18.9] 10/10/2016  . Heme + stool [R19.5] 09/10/2016  . Constipation [K59.00] 09/10/2016  . 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 Anemia   . Arthritis    RA  . Cancer ([place])    skin cancer  . Depression   . Diabetes mellitus without complication ([place])   . Fibromyalgia   . GERD (gastroesophageal reflux disease)   . Heart murmur    ECHO scheduled 05-25-2015  . Hyperlipidemia   . Neuropathy   . PONV (postoperative nausea and vomiting)     Past Surgical History Past Surgical History:  Procedure Laterality Date  . ABDOMINAL HYSTERECTOMY    . BIOPSY  09/23/2016   Procedure: BIOPSY;  Surgeon: Danie Binder, MD;  [facility];  Service: Endoscopy;;  hepatic flexure mass  . CHOLECYSTECTOMY    . COLONOSCOPY N/A 08/13/2017   Procedure: COLONOSCOPY;  Surgeon: Daneil Dolin, MD;  [facility];  Service: Endoscopy;  Laterality: N/A;  . COLONOSCOPY WITH PROPOFOL N/A 09/23/2016   Procedure: COLONOSCOPY WITH PROPOFOL;  Surgeon: Danie Binder, MD;  [facility];  Service: Endoscopy;  Laterality: N/A;  7:30 am  . HARDWARE REMOVAL Right 05/30/2015   Procedure: HARDWARE REMOVAL;  Surgeon: Ninetta Lights, MD;  Location: [place];  Service: Orthopedics;  Laterality: Right;  . PARTIAL COLECTOMY N/A 10/10/2016   Procedure: PARTIAL COLECTOMY;  Surgeon: Aviva Signs, MD;  Location: [facility];  Service: General;  Laterality: N/A;  . planter warts  Bilateral    both feet  . POLYPECTOMY  09/23/2016   Procedure: POLYPECTOMY;  Surgeon: Danie Binder, MD;  [facility];  Service: Endoscopy;;  transverse colon polyps times 2, rectal polyp  . skin grafts     due to planter warts  . TOTAL KNEE ARTHROPLASTY Right 05/30/2015   Procedure: RIGHT TOTAL KNEE ARTHROPLASTY;  Surgeon: Ninetta Lights, MD;  Location: [place];  Service: Orthopedics;  Laterality: Right;    Family History Family History  Problem Relation Age of Onset  . Colon cancer Neg Hx      Social History  reports that she quit smoking about 13 years ago. Her smoking use included cigarettes. She has a 40.00 pack-year smoking history. She has never used smokeless tobacco. She reports that she does not drink alcohol or use drugs.  Medications  Current Outpatient Medications:  .  acetaminophen ([employer] PAIN RELIEF EXTRA STRENGTH) 500 MG  tablet, Take 500 mg by mouth every 6 (six) hours as needed (for pain.)., Disp: , Rfl:  .  atorvastatin (LIPITOR) 20 MG tablet, Take 20 mg by mouth every morning. , Disp: , Rfl:  .  Calcium Carb-Cholecalciferol (CALCIUM + D3) 600-200 MG-UNIT TABS, Take 1 tablet by mouth 2 (two) times daily., Disp: , Rfl: 0 .  carvedilol (COREG) 6.25 MG tablet, Take 6.25 mg by mouth 2 (two) times daily with a meal., Disp: , Rfl:  .  Cholecalciferol (VITAMIN D3) 50000 units CAPS, TAKE 1 CAPSULE BY MOUTH ONE TIME PER WEEK, Disp: , Rfl: 1 .  folic acid (FOLVITE) 1 MG tablet, Take 1 mg by mouth daily., Disp: , Rfl:  .  furosemide (LASIX) 40 MG tablet, Take 40 mg by mouth as needed. , Disp: , Rfl:  .  gabapentin (NEURONTIN) 600 MG tablet, Take 600 mg by mouth 2 (two) times daily. MAY TAKE 3RD CAPSULE IF NEEDED, Disp: , Rfl:  .  GLUCOSAMINE-CHONDROITIN PO, Take 1 tablet by mouth 2 (two) times daily., Disp: , Rfl:  .  Insulin Glargine (BASAGLAR KWIKPEN [place]), Inject 42 Units into the skin every morning., Disp: , Rfl:  .  KLOR-CON M20 20 MEQ tablet, Take 20 mEq by mouth as  needed (When taking Lasix). , Disp: , Rfl: 3 .  magnesium gluconate (MAGONATE) 30 MG tablet, Take 30 mg as needed by mouth., Disp: , Rfl:  .  methotrexate (RHEUMATREX) 2.5 MG tablet, Take 20 mg by mouth every Sunday. Caution:Chemotherapy. Protect from light. , Disp: , Rfl:  .  Milnacipran HCl (SAVELLA) 100 MG TABS tablet, Take 100 mg by mouth 2 (two) times daily., Disp: , Rfl:  .  Multiple Vitamins-Minerals (CENTRUM SILVER PO), Take 1 tablet by mouth daily., Disp: , Rfl:  .  NOVOLOG MIX 70/30 FLEXPEN (70-30) 100 UNIT/ML FlexPen, INJECT 30 UNITS SUBCUTANEOUSLY TWICE DAILY AT BREAKFAST AND DINNER, Disp: , Rfl: 3 .  omeprazole (PRILOSEC) 40 MG capsule, TAKE ONE CAPSULE BY MOUTH ONCE A DAY, Disp: , Rfl: 3 .  rivaroxaban (XARELTO) 20 MG TABS tablet, Take 1 tablet (20 mg total) by mouth daily with supper. Resume on 12/31, Disp: 90 tablet, Rfl: 0 .  rOPINIRole (REQUIP) 1 MG tablet, Take 1 mg by mouth 2 (two) times daily. MAY TAKE 3RD TABLET AS NEEDED, Disp: , Rfl:  .  Vitamin D, Ergocalciferol, (DRISDOL) 50000 units CAPS capsule, Take 50,000 Units by mouth every Wednesday., Disp: , Rfl:  .  XARELTO 20 MG TABS tablet, TAKE 1 TABLET (20 MG TOTAL) BY MOUTH DAILY WITH SUPPER., Disp: 90 tablet, Rfl: 0 No current facility-administered medications for this visit.   Facility-Administered Medications Ordered in Other Visits:  .  0.9 %  sodium chloride infusion, , Intravenous, Continuous, Holley Bouche, NP, Stopped at 09/14/17 1440  Allergies Adhesive [tape]; Sulfa antibiotics; and Erythromycin  Review of Systems Review of Systems - Oncology ROS as per HPI otherwise 12 point ROS is negative.   Physical Exam  Vitals Wt Readings from Last 3 Encounters:  11/05/17 246 lb 8 oz (111.8 kg)  09/10/17 250 lb (113.4 kg)  08/12/17 248 lb 7.3 oz (112.7 kg)   Temp Readings from Last 3 Encounters:  11/05/17 97.6 F (36.4 C) (Oral)  09/21/17 98 F (36.7 C) (Oral)  09/14/17 98.1 F (36.7 C) (Oral)   BP  Readings from Last 3 Encounters:  11/05/17 (!) 143/60  09/21/17 (!) 137/52  09/14/17 (!) 133/45   Pulse Readings from Last 3  Encounters:  11/05/17 88  09/21/17 73  09/14/17 78   Constitutional: Well-developed, well-nourished, and in no distress.   HENT: Head: Normocephalic and atraumatic.  Mouth/Throat: No oropharyngeal exudate. Mucosa moist. Eyes: Pupils are equal, round, and reactive to light. Conjunctivae are normal. No scleral icterus.  Neck: Normal range of motion. Neck supple. No JVD present.  Cardiovascular: Normal rate, regular rhythm and normal heart sounds.  Exam reveals no gallop and no friction rub.   No murmur heard. Pulmonary/Chest: Effort normal and breath sounds normal. No respiratory distress. No wheezes.No rales.  Abdominal: Soft. Bowel sounds are normal. No distension. There is no tenderness. There is no guarding.  Musculoskeletal: No edema or tenderness.  Lymphadenopathy: No cervical, axillary or supraclavicular adenopathy.  Neurological: Alert and oriented to person, place, and time. No cranial nerve deficit.  Skin: Skin is warm and dry. No rash noted. No erythema. No pallor.  Psychiatric: Affect and judgment normal.   Labs Office Visit on 11/05/2017  Component Date Value Ref Range Status  . Hep B Core Total Ab 11/05/2017 Negative  Negative Final   Comment: (NOTE) Performed At: [facility] [place], [place] [id number] Rush Farmer MD [id number] Performed at [facility], [address] [place], [place] [address]   Appointment on 11/05/2017  Component Date Value Ref Range Status  . WBC 11/05/2017 8.4  4.0 - 10.5 K/uL Final  . RBC 11/05/2017 4.32  3.87 - 5.11 MIL/uL Final  . Hemoglobin 11/05/2017 12.6  12.0 - 15.0 g/dL Final  . HCT 11/05/2017 39.3  36.0 - 46.0 % Final  . MCV 11/05/2017 91.0  78.0 - 100.0 fL Final  . MCH 11/05/2017 29.2  26.0 - 34.0 pg Final  . MCHC 11/05/2017 32.1  30.0 - 36.0 g/dL Final  . RDW 11/05/2017  21.5* 11.5 - 15.5 % Final  . Platelets 11/05/2017 252  150 - 400 K/uL Final  . Neutrophils Relative % 11/05/2017 65  % Final  . Neutro Abs 11/05/2017 5.5  1.7 - 7.7 K/uL Final  . Lymphocytes Relative 11/05/2017 25  % Final  . Lymphs Abs 11/05/2017 2.1  0.7 - 4.0 K/uL Final  . Monocytes Relative 11/05/2017 7  % Final  . Monocytes Absolute 11/05/2017 0.6  0.1 - 1.0 K/uL Final  . Eosinophils Relative 11/05/2017 3  % Final  . Eosinophils Absolute 11/05/2017 0.2  0.0 - 0.7 K/uL Final  . Basophils Relative 11/05/2017 0  % Final  . Basophils Absolute 11/05/2017 0.0  0.0 - 0.1 K/uL Final  . RBC Morphology 11/05/2017 ANISOCYTES   Final   Performed at [facility], [address] [place], [place] [address]  . Sodium 11/05/2017 136  135 - 145 mmol/L Final  . Potassium 11/05/2017 4.3  3.5 - 5.1 mmol/L Final  . Chloride 11/05/2017 97* 101 - 111 mmol/L Final  . CO2 11/05/2017 28  22 - 32 mmol/L Final  . Glucose, Bld 11/05/2017 182* 65 - 99 mg/dL Final  . BUN 11/05/2017 20  6 - 20 mg/dL Final  . Creatinine, Ser 11/05/2017 1.10* 0.44 - 1.00 mg/dL Final  . Calcium 11/05/2017 9.4  8.9 - 10.3 mg/dL Final  . Total Protein 11/05/2017 7.0  6.5 - 8.1 g/dL Final  . Albumin 11/05/2017 3.5  3.5 - 5.0 g/dL Final  . AST 11/05/2017 28  15 - 41 U/L Final  . ALT 11/05/2017 34  14 - 54 U/L Final  . Alkaline Phosphatase 11/05/2017 66  38 - 126 U/L Final  .  Total Bilirubin 11/05/2017 0.5  0.3 - 1.2 mg/dL Final  . GFR calc non Af Amer 11/05/2017 49* >60 mL/min Final  . GFR calc Af Amer 11/05/2017 56* >60 mL/min Final   Comment: (NOTE) The eGFR has been calculated using the CKD EPI equation. This calculation has not been validated in all clinical situations. eGFR's persistently <60 mL/min signify possible Chronic Kidney Disease.   Georgiann Hahn gap 11/05/2017 11  5 - 15 Final   Performed at [facility], [address] [place], [place] [address]  . CEA 11/05/2017 4.0  0.0 - 4.7 ng/mL Final   Comment: (NOTE)                              Nonsmokers          <3.9                             Smokers             <5.6 [employer] Electrochemiluminescence Immunoassay (ECLIA) Values obtained with different assay methods or kits cannot be used interchangeably.  Results cannot be interpreted as absolute evidence of the presence or absence of malignant disease. Performed At: [facility] [place], [place] [id number] Rush Farmer MD [id number] Performed at [facility], [address] [place], [place] [address]   . Ferritin 11/05/2017 230  11 - 307 ng/mL Final   Performed at [facility], [place] [address], [place], [place] [address]  . Iron 11/05/2017 64  28 - 170 ug/dL Final  . TIBC 11/05/2017 280  250 - 450 ug/dL Final  . Saturation Ratios 11/05/2017 23  10.4 - 31.8 % Final  . UIBC 11/05/2017 216  ug/dL Final   Performed at [facility], [place] [address], [place], [place] [address]  . Hepatitis B Surface Ag 11/05/2017 Negative  Negative Final  . HCV Ab 11/05/2017 <0.1  0.0 - 0.9 s/co ratio Final   Comment: (NOTE)                                  Negative:     < 0.8                             Indeterminate: 0.8 - 0.9                                  Positive:     > 0.9 The CDC recommends that a positive HCV antibody result be followed up with a HCV Nucleic Acid Amplification test (741287). Performed At: [facility] [place], [place] [id number] Rush Farmer MD [id number]   . Hep A IgM 11/05/2017 Negative  Negative Final  . Hep B C IgM 11/05/2017 Negative  Negative Final   Performed at [facility], [address] [place], [place] [address]  . Hepatitis B Surface Ag 11/05/2017 Negative  Negative Final   Comment: (NOTE) Performed At: [facility] [place], [place] [id number] Rush Farmer MD [id number] Performed at [facility], [address] [place], [place] [address]   . Hep B S Ab 11/05/2017 Non Reactive    Final   Comment: (NOTE)  Non Reactive: Inconsistent with immunity,                            less than 10 mIU/mL              Reactive:     Consistent with immunity,                            greater than 9.9 mIU/mL Performed At: [facility] [place], [place] [id number] Rush Farmer MD [id number] Performed at [facility], [address], [place], [place] [address]      Pathology Orders Placed This Encounter  Procedures  . NM PET Image Restag (PS) Skull Base To Thigh    Standing Status:   Future    Standing Expiration Date:   11/05/2018    Order Specific Question:   If indicated for the ordered procedure, I authorize the administration of a radiopharmaceutical per Radiology protocol    Answer:   Yes    Order Specific Question:   Preferred imaging location?    Answer:   [facility]    Order Specific Question:   Radiology Contrast Protocol - do NOT remove file path    Answer:   [email or web address]  . Hepatitis B surface antibody    Standing Status:   Future    Number of Occurrences:   1    Standing Expiration Date:   11/05/2018  . Hepatitis B surface antigen    Standing Status:   Future    Number of Occurrences:   1    Standing Expiration Date:   11/05/2018  . Hepatitis B core antibody, total  . Hepatitis C RNA quantitative    Standing Status:   Future    Number of Occurrences:   1    Standing Expiration Date:   11/05/2018  . Hepatitis panel, acute    Standing Status:   Future    Number of Occurrences:   1    Standing Expiration Date:   11/05/2018       Zoila Shutter MD

## 2018-02-22 ENCOUNTER — Encounter (HOSPITAL_COMMUNITY)
Admission: RE | Admit: 2018-02-22 | Discharge: 2018-02-22 | Disposition: A | Discharge status: Home / Self Care | Payer: Medicare Other | Source: Ambulatory Visit | Attending: Internal Medicine | Admitting: Internal Medicine

## 2018-02-22 DIAGNOSIS — R160 Hepatomegaly, not elsewhere classified: Secondary | ICD-10-CM | POA: Diagnosis not present

## 2018-02-22 DIAGNOSIS — R911 Solitary pulmonary nodule: Secondary | ICD-10-CM | POA: Diagnosis present

## 2018-02-22 MED ORDER — FLUDEOXYGLUCOSE F - 18 (FDG) INJECTION
14.1400 | Freq: Once | INTRAVENOUS | Status: AC | PRN
  Administered 2018-02-22: 14.14 via INTRAVENOUS

## 2018-02-24 ENCOUNTER — Other Ambulatory Visit (HOSPITAL_COMMUNITY): Payer: Medicare Other

## 2018-02-24 ENCOUNTER — Ambulatory Visit (HOSPITAL_COMMUNITY): Payer: Medicare Other | Admitting: Internal Medicine

## 2018-03-08 ENCOUNTER — Other Ambulatory Visit (HOSPITAL_COMMUNITY): Payer: Self-pay | Admitting: *Deleted

## 2018-03-08 ENCOUNTER — Other Ambulatory Visit (HOSPITAL_COMMUNITY): Payer: Self-pay | Admitting: Nurse Practitioner

## 2018-03-08 ENCOUNTER — Telehealth (HOSPITAL_COMMUNITY): Payer: Self-pay | Admitting: *Deleted

## 2018-03-08 DIAGNOSIS — C189 Malignant neoplasm of colon, unspecified: Secondary | ICD-10-CM

## 2018-03-08 DIAGNOSIS — I2699 Other pulmonary embolism without acute cor pulmonale: Secondary | ICD-10-CM

## 2018-03-08 MED ORDER — RIVAROXABAN 20 MG PO TABS
ORAL_TABLET | ORAL | 0 refills | Status: DC
Start: 2018-03-08 — End: 2018-06-16

## 2018-03-08 MED ORDER — RIVAROXABAN 20 MG PO TABS: ORAL_TABLET | ORAL | 0 refills | Status: DC

## 2018-03-08 NOTE — Telephone Encounter (Signed)
Pt's daughter called stating that her mother has not her Xarelto for a week and that she wanted to know what that could cause. She stated that her mother also needed a refill on her Xarelto as well.  I spoke to Dr. Delton Coombes about above and he advised that the pt's risk of blood clot would be higher due to her not being on the medication.   I informed the daughter of above and she verbalized understanding. I also informed that the refill would be sent into her mother's pharmacy.   The pt's daughter stated that she didn't know how all this happened, that the pharmacy sent the refill request to our NP but that she had moved addresses and the Rx was sent to [facility].

## 2018-03-15 ENCOUNTER — Other Ambulatory Visit (HOSPITAL_COMMUNITY): Payer: Self-pay

## 2018-03-15 DIAGNOSIS — R160 Hepatomegaly, not elsewhere classified: Secondary | ICD-10-CM

## 2018-03-15 DIAGNOSIS — I2699 Other pulmonary embolism without acute cor pulmonale: Secondary | ICD-10-CM

## 2018-03-15 DIAGNOSIS — C189 Malignant neoplasm of colon, unspecified: Secondary | ICD-10-CM

## 2018-03-16 ENCOUNTER — Other Ambulatory Visit: Payer: Self-pay

## 2018-03-16 ENCOUNTER — Inpatient Hospital Stay (HOSPITAL_COMMUNITY): Payer: Medicare Other | Attending: Hematology | Admitting: Hematology

## 2018-03-16 ENCOUNTER — Inpatient Hospital Stay (HOSPITAL_COMMUNITY): Payer: Medicare Other

## 2018-03-16 ENCOUNTER — Encounter (HOSPITAL_COMMUNITY): Payer: Self-pay | Admitting: Hematology

## 2018-03-16 VITALS — BP 155/59 | HR 91 | Temp 97.9°F | Resp 18 | Wt 244.3 lb

## 2018-03-16 DIAGNOSIS — Z85828 Personal history of other malignant neoplasm of skin: Secondary | ICD-10-CM | POA: Insufficient documentation

## 2018-03-16 DIAGNOSIS — Z7901 Long term (current) use of anticoagulants: Secondary | ICD-10-CM | POA: Insufficient documentation

## 2018-03-16 DIAGNOSIS — E611 Iron deficiency: Secondary | ICD-10-CM | POA: Insufficient documentation

## 2018-03-16 DIAGNOSIS — R160 Hepatomegaly, not elsewhere classified: Secondary | ICD-10-CM

## 2018-03-16 DIAGNOSIS — I2699 Other pulmonary embolism without acute cor pulmonale: Secondary | ICD-10-CM

## 2018-03-16 DIAGNOSIS — Z87891 Personal history of nicotine dependence: Secondary | ICD-10-CM | POA: Insufficient documentation

## 2018-03-16 DIAGNOSIS — C182 Malignant neoplasm of ascending colon: Secondary | ICD-10-CM

## 2018-03-16 DIAGNOSIS — E119 Type 2 diabetes mellitus without complications: Secondary | ICD-10-CM | POA: Diagnosis not present

## 2018-03-16 DIAGNOSIS — C189 Malignant neoplasm of colon, unspecified: Secondary | ICD-10-CM

## 2018-03-16 LAB — CBC WITH DIFFERENTIAL/PLATELET
Basophils Absolute: 0 10*3/uL (ref 0.0–0.1)
Basophils Relative: 0 %
Eosinophils Absolute: 0.1 10*3/uL (ref 0.0–0.7)
Eosinophils Relative: 1 %
HCT: 39 % (ref 36.0–46.0)
Hemoglobin: 12.8 g/dL (ref 12.0–15.0)
Lymphocytes Relative: 26 %
Lymphs Abs: 2.4 10*3/uL (ref 0.7–4.0)
MCH: 31.2 pg (ref 26.0–34.0)
MCHC: 32.8 g/dL (ref 30.0–36.0)
MCV: 95.1 fL (ref 78.0–100.0)
Monocytes Absolute: 0.7 10*3/uL (ref 0.1–1.0)
Monocytes Relative: 7 %
Neutro Abs: 6 10*3/uL (ref 1.7–7.7)
Neutrophils Relative %: 66 %
Platelets: 233 10*3/uL (ref 150–400)
RBC: 4.1 MIL/uL (ref 3.87–5.11)
RDW: 13.9 % (ref 11.5–15.5)
WBC: 9.2 10*3/uL (ref 4.0–10.5)

## 2018-03-16 LAB — COMPREHENSIVE METABOLIC PANEL
ALT: 29 U/L (ref 0–44)
AST: 28 U/L (ref 15–41)
Albumin: 3.6 g/dL (ref 3.5–5.0)
Alkaline Phosphatase: 50 U/L (ref 38–126)
Anion gap: 9 (ref 5–15)
BUN: 27 mg/dL — ABNORMAL HIGH (ref 8–23)
CO2: 29 mmol/L (ref 22–32)
Calcium: 9.1 mg/dL (ref 8.9–10.3)
Chloride: 103 mmol/L (ref 98–111)
Creatinine, Ser: 1.16 mg/dL — ABNORMAL HIGH (ref 0.44–1.00)
GFR calc Af Amer: 53 mL/min — ABNORMAL LOW (ref >60)
GFR calc non Af Amer: 46 mL/min — ABNORMAL LOW (ref >60)
Glucose, Bld: 152 mg/dL — ABNORMAL HIGH (ref 70–99)
Potassium: 4.6 mmol/L (ref 3.5–5.1)
Sodium: 141 mmol/L (ref 135–145)
Total Bilirubin: 0.7 mg/dL (ref 0.3–1.2)
Total Protein: 6.9 g/dL (ref 6.5–8.1)

## 2018-03-16 NOTE — Assessment & Plan Note (Addendum)
1.  Stage IIa (CT3CN0) colon cancer: - Status post right hemicolectomy on 10/10/2016, grade 2 adenocarcinoma, positive lymphovascular invasion, margins negative, 0 out of 19 lymph nodes positive. - Because of positive lymphovascular invasion she was recommended to have Xeloda.  She could not tolerate more than 2 cycles. - Last PET CT scan on February 22, 2018 was negative for any metastatic disease particularly the liver mass.  There was a 3.1 cm central liver mass on MRI from March 2019 which was not PET avid. -She will continue to have follow-up visits once every 3 months with repeat CEA and LFTs.  She will have CT scans done of the abdomen and pelvis every 6 months for the first 2 years.  2.  Pulmonary embolism: - Diagnosed on a CT scan of the chest incidentally in August 2018.  From history it appears to be unprovoked.  She is on Xarelto 20 mg daily at this time.  She does not have any bleeding complications.  She just got her shipment for 90-day supply.  Hence she will continue taking them until the next visit.  After that I will cut Xarelto dose down to 10 mg daily.  3.  Iron deficiency state: - She received Injectafer on 09/06/2017 on 09/21/2017.  Today her CBC is within normal limits.  She does not require any parenteral iron at this time.

## 2018-03-16 NOTE — Progress Notes (Signed)
[place], [place] [id number]   CLINIC:  Medical Oncology/Hematology  PCP:  Earney Mallet, MD [address] [place] [address] [phone]   REASON FOR VISIT:  Follow-up for stage IIA colon cancer  CURRENT THERAPY: Observation  BRIEF ONCOLOGIC HISTORY:    Colon cancer ([place])   10/10/2016 Initial Diagnosis    Colon cancer ([place])      10/10/2016 Surgery    Partial colectomy by Dr. Arnoldo Morale  Pathology shows  a 2.1 cm grade 2 adenocarcinoma of the ascending colon with invasion through the muscularis propria into the peri-colorectal tissues.         11/17/2016 PET scan    The hepatic lesion seen on the CT scan and MRI does not demonstrate hypermetabolism and is likely a benign entity.  Right maxillary sinus disease with associated hypermetabolism.  Scattered pulmonary nodules, likely benign. No follow-up needed if patient is low-risk (and has no known or suspected primary neoplasm). Non-contrast chest CT can be considered in 12 months if patient is high-risk.       12/25/2016 - 02/04/2017 Adjuvant Chemotherapy    Xeloda 2000mg  PO BID take for 14 days out of 21 days. Plan for total of 24 weeks of treatment.        CANCER STAGING: Cancer Staging Colon cancer [facility]) Staging form: Colon and Rectum, AJCC 8th Edition - Clinical: Stage IIA (cT3, cN0, cM0) - Signed by [name], MD on 11/26/2016    INTERVAL HISTORY:  Ms. Simi 72 y.o. female returns for routine follow-up for colon cancer. She is here today with her daughter. She is has been doing well. She does get fatigued occassionally. She lives alone and performs all her own ADLs and activities. She is still taking her Xarelto due to blood clots found in her lungs 6 months after her colon surgery. Patient energy levels and appetite are stable around 50%.     REVIEW OF SYSTEMS:  Review of Systems  Constitutional: Positive for fatigue.  HENT:  Negative.   Eyes: Negative.     Respiratory: Negative.   Cardiovascular: Positive for leg swelling.  Gastrointestinal: Positive for nausea.  Endocrine: Negative.   Genitourinary: Negative.    Skin: Negative.   Neurological: Positive for dizziness, extremity weakness and numbness.  Hematological: Bruises/bleeds easily.     PAST MEDICAL/SURGICAL HISTORY:  Past Medical History:  Diagnosis Date  . Anemia   . Arthritis    RA  . Cancer ([place])    skin cancer  . Depression   . Diabetes mellitus without complication ([place])   . Fibromyalgia   . GERD (gastroesophageal reflux disease)   . Heart murmur    ECHO scheduled 05-25-2015  . Hyperlipidemia   . Neuropathy   . PONV (postoperative nausea and vomiting)    Past Surgical History:  Procedure Laterality Date  . ABDOMINAL HYSTERECTOMY    . BIOPSY  09/23/2016   Procedure: BIOPSY;  Surgeon: Danie Binder, MD;  [facility];  Service: Endoscopy;;  hepatic flexure mass  . CHOLECYSTECTOMY    . COLONOSCOPY N/A 08/13/2017   Procedure: COLONOSCOPY;  Surgeon: Daneil Dolin, MD;  [facility];  Service: Endoscopy;  Laterality: N/A;  . COLONOSCOPY WITH PROPOFOL N/A 09/23/2016   Procedure: COLONOSCOPY WITH PROPOFOL;  Surgeon: Danie Binder, MD;  [facility];  Service: Endoscopy;  Laterality: N/A;  7:30 am  . HARDWARE REMOVAL Right 05/30/2015   Procedure: HARDWARE REMOVAL;  Surgeon:  Ninetta Lights, MD;  Location: [facility];  Service: Orthopedics;  Laterality: Right;  . PARTIAL COLECTOMY N/A 10/10/2016   Procedure: PARTIAL COLECTOMY;  Surgeon: Aviva Signs, MD;  Location: [facility];  Service: General;  Laterality: N/A;  . planter warts Bilateral    both feet  . POLYPECTOMY  09/23/2016   Procedure: POLYPECTOMY;  Surgeon: Danie Binder, MD;  [facility];  Service: Endoscopy;;  transverse colon polyps times 2, rectal polyp  . skin grafts     due to planter warts  . TOTAL KNEE ARTHROPLASTY Right 05/30/2015   Procedure: RIGHT TOTAL KNEE  ARTHROPLASTY;  Surgeon: Ninetta Lights, MD;  Location: [facility];  Service: Orthopedics;  Laterality: Right;     SOCIAL HISTORY:  Social History   Socioeconomic History  . Marital status: Widowed    Spouse name: Not on file  . Number of children: Not on file  . Years of education: Not on file  . Highest education level: Not on file  Occupational History  . Not on file  Social Needs  . Financial resource strain: Not on file  . Food insecurity:    Worry: Not on file    Inability: Not on file  . Transportation needs:    Medical: Not on file    Non-medical: Not on file  Tobacco Use  . Smoking status: Former Smoker    Packs/day: 1.00    Years: 40.00    Pack years: 40.00    Types: Cigarettes    Last attempt to quit: 08/18/2004    Years since quitting: 13.5  . Smokeless tobacco: Never Used  Substance and Sexual Activity  . Alcohol use: No  . Drug use: No  . Sexual activity: Not Currently    Birth control/protection: Surgical  Lifestyle  . Physical activity:    Days per week: Not on file    Minutes per session: Not on file  . Stress: Not on file  Relationships  . Social connections:    Talks on phone: Not on file    Gets together: Not on file    Attends religious service: Not on file    Active member of club or organization: Not on file    Attends meetings of clubs or organizations: Not on file    Relationship status: Not on file  . Intimate partner violence:    Fear of current or ex partner: Not on file    Emotionally abused: Not on file    Physically abused: Not on file    Forced sexual activity: Not on file  Other Topics Concern  . Not on file  Social History Narrative  . Not on file    FAMILY HISTORY:  Family History  Problem Relation Age of Onset  . Colon cancer Neg Hx     CURRENT MEDICATIONS:  Outpatient Encounter Medications as of 03/16/2018  Medication Sig  . acetaminophen ([employer] PAIN RELIEF EXTRA STRENGTH) 500 MG tablet Take 500 mg by mouth every 6 (six)  hours as needed (for pain.).  Marland Kitchen atorvastatin (LIPITOR) 20 MG tablet Take 20 mg by mouth every morning.   . Calcium Carb-Cholecalciferol (CALCIUM + D3) 600-200 MG-UNIT TABS Take 1 tablet by mouth 2 (two) times daily.  . carvedilol (COREG) 6.25 MG tablet Take 6.25 mg by mouth 2 (two) times daily with a meal.  . Cholecalciferol (VITAMIN D3) 50000 units CAPS TAKE 1 CAPSULE BY MOUTH ONE TIME PER WEEK  . Cholecalciferol (VITAMIN D3) 50000 units TABS  TAKE 1 TABLET BY MOUTH ONE TIME PER WEEK  . folic acid (FOLVITE) 1 MG tablet Take 1 mg by mouth daily.  . furosemide (LASIX) 40 MG tablet Take 40 mg by mouth as needed.   . gabapentin (NEURONTIN) 600 MG tablet Take 600 mg by mouth 2 (two) times daily. MAY TAKE 3RD CAPSULE IF NEEDED  . GLUCOSAMINE-CHONDROITIN PO Take 1 tablet by mouth 2 (two) times daily.  . Insulin Glargine (BASAGLAR KWIKPEN) 100 UNIT/ML SOPN INJECT 42 UNITS IN THE MORNING  . KLOR-CON M20 20 MEQ tablet Take 20 mEq by mouth as needed (When taking Lasix).   Marland Kitchen losartan (COZAAR) 100 MG tablet Take 100 mg by mouth daily.  . magnesium gluconate (MAGONATE) 30 MG tablet Take 30 mg as needed by mouth.  . methotrexate (RHEUMATREX) 2.5 MG tablet Take 20 mg by mouth every Sunday. Caution:Chemotherapy. Protect from light.   . Milnacipran HCl (SAVELLA) 100 MG TABS tablet Take 100 mg by mouth 2 (two) times daily.  . Multiple Vitamins-Minerals (CENTRUM SILVER PO) Take 1 tablet by mouth daily.  Marland Kitchen NOVOLOG MIX 70/30 FLEXPEN (70-30) 100 UNIT/ML FlexPen INJECT 30 UNITS SUBCUTANEOUSLY TWICE DAILY AT BREAKFAST AND DINNER  . omeprazole (PRILOSEC) 40 MG capsule TAKE ONE CAPSULE BY MOUTH ONCE A DAY  . rivaroxaban (XARELTO) 20 MG TABS tablet TAKE 1 TABLET (20 MG TOTAL) BY MOUTH DAILY WITH SUPPER.  Marland Kitchen rOPINIRole (REQUIP) 1 MG tablet Take 1 mg by mouth 2 (two) times daily. MAY TAKE 3RD TABLET AS NEEDED  . Vitamin D, Ergocalciferol, (DRISDOL) 50000 units CAPS capsule Take 50,000 Units by mouth every Wednesday.  .  [DISCONTINUED] Insulin Glargine (BASAGLAR KWIKPEN [place]) Inject 42 Units into the skin every morning.  . [DISCONTINUED] rivaroxaban (XARELTO) 20 MG TABS tablet Take 1 tablet (20 mg total) by mouth daily with supper. Resume on 12/31   Facility-Administered Encounter Medications as of 03/16/2018  Medication  . 0.9 %  sodium chloride infusion    ALLERGIES:  Allergies  Allergen Reactions  . Adhesive [Tape]     Adhesive tape and band-aids cause skin irritation  . Sulfa Antibiotics Nausea Only and Other (See Comments)    Joint paint  . Erythromycin Itching and Rash    burning     PHYSICAL EXAM:  ECOG Performance status: 1  Vitals:   03/16/18 1140  BP: (!) 155/59  Pulse: 91  Resp: 18  Temp: 97.9 F (36.6 C)  SpO2: 98%   Filed Weights   03/16/18 1140  Weight: 244 lb 4.8 oz (110.8 kg)    Physical Exam HEENT: Oropharynx has no thrush. Chest: Bilateral clear to auscultation. CVS: S1-S2 regular rate and rhythm. Abdomen: No palpable hepatospleno megaly.  No palpable masses. Extremities: No edema or cyanosis.  LABORATORY DATA:  I have reviewed the labs as listed.  CBC    Component Value Date/Time   WBC 9.2 03/16/2018 1001   RBC 4.10 03/16/2018 1001   HGB 12.8 03/16/2018 1001   HCT 39.0 03/16/2018 1001   PLT 233 03/16/2018 1001   MCV 95.1 03/16/2018 1001   MCH 31.2 03/16/2018 1001   MCHC 32.8 03/16/2018 1001   RDW 13.9 03/16/2018 1001   LYMPHSABS 2.4 03/16/2018 1001   MONOABS 0.7 03/16/2018 1001   EOSABS 0.1 03/16/2018 1001   BASOSABS 0.0 03/16/2018 1001   CMP Latest Ref Rng & Units 03/16/2018 11/05/2017 11/02/2017  Glucose 70 - 99 mg/dL 152(H) 182(H) -  BUN 8 - 23 mg/dL 27(H) 20 -  Creatinine 0.44 -  1.00 mg/dL 1.16(H) 1.10(H) 1.00  Sodium 135 - 145 mmol/L 141 136 -  Potassium 3.5 - 5.1 mmol/L 4.6 4.3 -  Chloride 98 - 111 mmol/L 103 97(L) -  CO2 22 - 32 mmol/L 29 28 -  Calcium 8.9 - 10.3 mg/dL 9.1 9.4 -  Total Protein 6.5 - 8.1 g/dL 6.9 7.0 -  Total Bilirubin 0.3 -  1.2 mg/dL 0.7 0.5 -  Alkaline Phos 38 - 126 U/L 50 66 -  AST 15 - 41 U/L 28 28 -  ALT 0 - 44 U/L 29 34 -       DIAGNOSTIC IMAGING:  I have reviewed her PET scan from 02/22/2018, independently reviewed images and discussed with the patient.     ASSESSMENT & PLAN:   Malignant neoplasm of colon ([place]) 1.  Stage IIa (CT3CN0) colon cancer: - Status post right hemicolectomy on 10/10/2016, grade 2 adenocarcinoma, positive lymphovascular invasion, margins negative, 0 out of 19 lymph nodes positive. - Because of positive lymphovascular invasion she was recommended to have Xeloda.  She could not tolerate more than 2 cycles. - Last PET CT scan on February 22, 2018 was negative for any metastatic disease particularly the liver mass.  There was a 3.1 cm central liver mass on MRI from March 2019 which was not PET avid. -She will continue to have follow-up visits once every 3 months with repeat CEA and LFTs.  She will have CT scans done of the abdomen and pelvis every 6 months for the first 2 years.  2.  Pulmonary embolism: - Diagnosed on a CT scan of the chest incidentally in August 2018.  From history it appears to be unprovoked.  She is on Xarelto 20 mg daily at this time.  She does not have any bleeding complications.  She just got her shipment for 90-day supply.  Hence she will continue taking them until the next visit.  After that I will cut Xarelto dose down to 10 mg daily.  3.  Iron deficiency state: - She received Injectafer on 09/06/2017 on 09/21/2017.  Today her CBC is within normal limits.  She does not require any parenteral iron at this time.      Orders placed this encounter:  Orders Placed This Encounter  Procedures  . CEA  . CBC with Differential/Platelet  . Comprehensive metabolic panel  . Ferritin  . Iron and TIBC      Derek Jack, MD [place] [phone]

## 2018-06-09 ENCOUNTER — Inpatient Hospital Stay (HOSPITAL_COMMUNITY): Payer: Medicare Other | Attending: Hematology

## 2018-06-09 DIAGNOSIS — Z7901 Long term (current) use of anticoagulants: Secondary | ICD-10-CM | POA: Diagnosis not present

## 2018-06-09 DIAGNOSIS — E119 Type 2 diabetes mellitus without complications: Secondary | ICD-10-CM | POA: Diagnosis not present

## 2018-06-09 DIAGNOSIS — R160 Hepatomegaly, not elsewhere classified: Secondary | ICD-10-CM | POA: Insufficient documentation

## 2018-06-09 DIAGNOSIS — I2699 Other pulmonary embolism without acute cor pulmonale: Secondary | ICD-10-CM | POA: Insufficient documentation

## 2018-06-09 DIAGNOSIS — R5383 Other fatigue: Secondary | ICD-10-CM | POA: Insufficient documentation

## 2018-06-09 DIAGNOSIS — C182 Malignant neoplasm of ascending colon: Secondary | ICD-10-CM | POA: Diagnosis present

## 2018-06-09 DIAGNOSIS — L989 Disorder of the skin and subcutaneous tissue, unspecified: Secondary | ICD-10-CM | POA: Diagnosis not present

## 2018-06-09 DIAGNOSIS — E611 Iron deficiency: Secondary | ICD-10-CM | POA: Insufficient documentation

## 2018-06-09 DIAGNOSIS — R197 Diarrhea, unspecified: Secondary | ICD-10-CM | POA: Insufficient documentation

## 2018-06-09 DIAGNOSIS — Z87891 Personal history of nicotine dependence: Secondary | ICD-10-CM | POA: Insufficient documentation

## 2018-06-09 LAB — CBC WITH DIFFERENTIAL/PLATELET
Abs Immature Granulocytes: 0.03 10*3/uL (ref 0.00–0.07)
Basophils Absolute: 0.1 10*3/uL (ref 0.0–0.1)
Basophils Relative: 1 %
Eosinophils Absolute: 0.2 10*3/uL (ref 0.0–0.5)
Eosinophils Relative: 1 %
HCT: 41.4 % (ref 36.0–46.0)
Hemoglobin: 13 g/dL (ref 12.0–15.0)
Immature Granulocytes: 0 %
Lymphocytes Relative: 26 %
Lymphs Abs: 2.9 10*3/uL (ref 0.7–4.0)
MCH: 30.7 pg (ref 26.0–34.0)
MCHC: 31.4 g/dL (ref 30.0–36.0)
MCV: 97.6 fL (ref 80.0–100.0)
Monocytes Absolute: 0.7 10*3/uL (ref 0.1–1.0)
Monocytes Relative: 6 %
Neutro Abs: 7.3 10*3/uL (ref 1.7–7.7)
Neutrophils Relative %: 66 %
Platelets: 262 10*3/uL (ref 150–400)
RBC: 4.24 MIL/uL (ref 3.87–5.11)
RDW: 13.4 % (ref 11.5–15.5)
WBC: 11.1 10*3/uL — ABNORMAL HIGH (ref 4.0–10.5)
nRBC: 0 % (ref 0.0–0.2)

## 2018-06-09 LAB — COMPREHENSIVE METABOLIC PANEL
ALT: 33 U/L (ref 0–44)
AST: 27 U/L (ref 15–41)
Albumin: 3.6 g/dL (ref 3.5–5.0)
Alkaline Phosphatase: 53 U/L (ref 38–126)
Anion gap: 7 (ref 5–15)
BUN: 24 mg/dL — ABNORMAL HIGH (ref 8–23)
CO2: 30 mmol/L (ref 22–32)
Calcium: 9.5 mg/dL (ref 8.9–10.3)
Chloride: 105 mmol/L (ref 98–111)
Creatinine, Ser: 1 mg/dL (ref 0.44–1.00)
GFR calc Af Amer: >60 mL/min (ref >60)
GFR calc non Af Amer: 54 mL/min — ABNORMAL LOW (ref >60)
Glucose, Bld: 103 mg/dL — ABNORMAL HIGH (ref 70–99)
Potassium: 4.8 mmol/L (ref 3.5–5.1)
Sodium: 142 mmol/L (ref 135–145)
Total Bilirubin: 0.9 mg/dL (ref 0.3–1.2)
Total Protein: 7 g/dL (ref 6.5–8.1)

## 2018-06-09 LAB — FERRITIN: Ferritin: 96 ng/mL (ref 11–307)

## 2018-06-09 LAB — IRON AND TIBC
Iron: 100 ug/dL (ref 28–170)
Saturation Ratios: 32 % — ABNORMAL HIGH (ref 10.4–31.8)
TIBC: 315 ug/dL (ref 250–450)
UIBC: 215 ug/dL

## 2018-06-10 LAB — CEA: CEA: 4.4 ng/mL (ref 0.0–4.7)

## 2018-06-16 ENCOUNTER — Other Ambulatory Visit: Payer: Self-pay

## 2018-06-16 ENCOUNTER — Other Ambulatory Visit (HOSPITAL_COMMUNITY): Payer: Medicare Other

## 2018-06-16 ENCOUNTER — Inpatient Hospital Stay (HOSPITAL_BASED_OUTPATIENT_CLINIC_OR_DEPARTMENT_OTHER): Payer: Medicare Other | Admitting: Hematology

## 2018-06-16 ENCOUNTER — Encounter (HOSPITAL_COMMUNITY): Payer: Self-pay | Admitting: Hematology

## 2018-06-16 VITALS — BP 159/61 | HR 95 | Temp 98.0°F | Resp 18 | Wt 249.4 lb

## 2018-06-16 DIAGNOSIS — Z87891 Personal history of nicotine dependence: Secondary | ICD-10-CM

## 2018-06-16 DIAGNOSIS — R160 Hepatomegaly, not elsewhere classified: Secondary | ICD-10-CM | POA: Diagnosis not present

## 2018-06-16 DIAGNOSIS — R197 Diarrhea, unspecified: Secondary | ICD-10-CM

## 2018-06-16 DIAGNOSIS — E119 Type 2 diabetes mellitus without complications: Secondary | ICD-10-CM

## 2018-06-16 DIAGNOSIS — E611 Iron deficiency: Secondary | ICD-10-CM | POA: Diagnosis not present

## 2018-06-16 DIAGNOSIS — C182 Malignant neoplasm of ascending colon: Secondary | ICD-10-CM | POA: Diagnosis not present

## 2018-06-16 DIAGNOSIS — Z7901 Long term (current) use of anticoagulants: Secondary | ICD-10-CM

## 2018-06-16 DIAGNOSIS — R5383 Other fatigue: Secondary | ICD-10-CM

## 2018-06-16 DIAGNOSIS — L989 Disorder of the skin and subcutaneous tissue, unspecified: Secondary | ICD-10-CM

## 2018-06-16 DIAGNOSIS — I2699 Other pulmonary embolism without acute cor pulmonale: Secondary | ICD-10-CM

## 2018-06-16 DIAGNOSIS — C189 Malignant neoplasm of colon, unspecified: Secondary | ICD-10-CM

## 2018-06-16 MED ORDER — RIVAROXABAN 10 MG PO TABS: ORAL_TABLET | ORAL | 3 refills | Status: DC

## 2018-06-16 NOTE — Patient Instructions (Signed)
[facility] at [facility] Discharge Instructions     Thank you for choosing [facility] at [facility] to provide your oncology and hematology care.  To afford each patient quality time with our provider, please arrive at least 15 minutes before your scheduled appointment time.   If you have a lab appointment with the Cancer Center please come in thru the  Main Entrance and check in at the main information desk  You need to re-schedule your appointment should you arrive 10 or more minutes late.  We strive to give you quality time with our providers, and arriving late affects you and other patients whose appointments are after yours.  Also, if you no show three or more times for appointments you may be dismissed from the clinic at the providers discretion.     Again, thank you for choosing [facility].  Our hope is that these requests will decrease the amount of time that you wait before being seen by our physicians.       _____________________________________________________________  Should you have questions after your visit to [facility], please contact our office at [phone] between the hours of 8:00 a.m. and 4:30 p.m.  Voicemails left after 4:00 p.m. will not be returned until the following business day.  For prescription refill requests, have your pharmacy contact our office and allow 72 hours.    Cancer Center Support Programs:   > Cancer Support Group  2nd Tuesday of the month 1pm-2pm, Journey Room    

## 2018-06-16 NOTE — Progress Notes (Signed)
[place], [place] [address]   CLINIC:  Medical Oncology/Hematology  PCP:  Earney Mallet, MD [address] [place] [address] [phone]   REASON FOR VISIT: Follow-up for stage IIA colon cancer  CURRENT THERAPY: Observation  BRIEF ONCOLOGIC HISTORY:    Colon cancer ([facility])   10/10/2016 Initial Diagnosis    Colon cancer ([facility])    10/10/2016 Surgery    Partial colectomy by Dr. Arnoldo Morale  Pathology shows  a 2.1 cm grade 2 adenocarcinoma of the ascending colon with invasion through the muscularis propria into the peri-colorectal tissues.       11/17/2016 PET scan    The hepatic lesion seen on the CT scan and MRI does not demonstrate hypermetabolism and is likely a benign entity.  Right maxillary sinus disease with associated hypermetabolism.  Scattered pulmonary nodules, likely benign. No follow-up needed if patient is low-risk (and has no known or suspected primary neoplasm). Non-contrast chest CT can be considered in 12 months if patient is high-risk.     12/25/2016 - 02/04/2017 Adjuvant Chemotherapy    Xeloda 2000mg  PO BID take for 14 days out of 21 days. Plan for total of 24 weeks of treatment.      CANCER STAGING: Cancer Staging Colon cancer [facility]) Staging form: Colon and Rectum, AJCC 8th Edition - Clinical: Stage IIA (cT3, cN0, cM0) - Signed by [name], MD on 11/26/2016    INTERVAL HISTORY:  Wanda Curry 72 y.o. female returns for routine follow-up for colon cancer. Patient is here today with her daughter. She is doing well and trying remian active at home. She had multiple episodes of diarrhea and has recently related it to the peaches she was eating. She still has fatigue. She has a few itchy spots on her chin and her side. They have grown in size and are raised and rough to the touch. She reports she will make an appointment with Dermatology to have them looked at. She denies any new pains. Denies any bleeding or dark  stools. Denies any nausea, vomiting, or diarrhea.    REVIEW OF SYSTEMS:  Review of Systems  Constitutional: Positive for fatigue.  Cardiovascular: Positive for leg swelling.  Neurological: Positive for dizziness.  Hematological: Bruises/bleeds easily.  All other systems reviewed and are negative.    PAST MEDICAL/SURGICAL HISTORY:  Past Medical History:  Diagnosis Date  . Anemia   . Arthritis    RA  . Cancer ([place])    skin cancer  . Depression   . Diabetes mellitus without complication ([facility])   . Fibromyalgia   . GERD (gastroesophageal reflux disease)   . Heart murmur    ECHO scheduled 05-25-2015  . Hyperlipidemia   . Neuropathy   . PONV (postoperative nausea and vomiting)    Past Surgical History:  Procedure Laterality Date  . ABDOMINAL HYSTERECTOMY    . BIOPSY  09/23/2016   Procedure: BIOPSY;  Surgeon: Danie Binder, MD;  [facility];  Service: Endoscopy;;  hepatic flexure mass  . CHOLECYSTECTOMY    . COLONOSCOPY N/A 08/13/2017   Procedure: COLONOSCOPY;  Surgeon: Daneil Dolin, MD;  [facility];  Service: Endoscopy;  Laterality: N/A;  . COLONOSCOPY WITH PROPOFOL N/A 09/23/2016   Procedure: COLONOSCOPY WITH PROPOFOL;  Surgeon: Danie Binder, MD;  [facility];  Service: Endoscopy;  Laterality: N/A;  7:30 am  . HARDWARE REMOVAL Right 05/30/2015   Procedure: HARDWARE REMOVAL;  Surgeon: Ninetta Lights,  MD;  Location: [facility];  Service: Orthopedics;  Laterality: Right;  . PARTIAL COLECTOMY N/A 10/10/2016   Procedure: PARTIAL COLECTOMY;  Surgeon: Aviva Signs, MD;  Location: [facility];  Service: General;  Laterality: N/A;  . planter warts Bilateral    both feet  . POLYPECTOMY  09/23/2016   Procedure: POLYPECTOMY;  Surgeon: Danie Binder, MD;  [facility];  Service: Endoscopy;;  transverse colon polyps times 2, rectal polyp  . skin grafts     due to planter warts  . TOTAL KNEE ARTHROPLASTY Right 05/30/2015   Procedure: RIGHT TOTAL  KNEE ARTHROPLASTY;  Surgeon: Ninetta Lights, MD;  Location: [facility];  Service: Orthopedics;  Laterality: Right;     SOCIAL HISTORY:  Social History   Socioeconomic History  . Marital status: Widowed    Spouse name: Not on file  . Number of children: Not on file  . Years of education: Not on file  . Highest education level: Not on file  Occupational History  . Not on file  Social Needs  . Financial resource strain: Not on file  . Food insecurity:    Worry: Not on file    Inability: Not on file  . Transportation needs:    Medical: Not on file    Non-medical: Not on file  Tobacco Use  . Smoking status: Former Smoker    Packs/day: 1.00    Years: 40.00    Pack years: 40.00    Types: Cigarettes    Last attempt to quit: 08/18/2004    Years since quitting: 13.8  . Smokeless tobacco: Never Used  Substance and Sexual Activity  . Alcohol use: No  . Drug use: No  . Sexual activity: Not Currently    Birth control/protection: Surgical  Lifestyle  . Physical activity:    Days per week: Not on file    Minutes per session: Not on file  . Stress: Not on file  Relationships  . Social connections:    Talks on phone: Not on file    Gets together: Not on file    Attends religious service: Not on file    Active member of club or organization: Not on file    Attends meetings of clubs or organizations: Not on file    Relationship status: Not on file  . Intimate partner violence:    Fear of current or ex partner: Not on file    Emotionally abused: Not on file    Physically abused: Not on file    Forced sexual activity: Not on file  Other Topics Concern  . Not on file  Social History Narrative  . Not on file    FAMILY HISTORY:  Family History  Problem Relation Age of Onset  . Colon cancer Neg Hx     CURRENT MEDICATIONS:  Outpatient Encounter Medications as of 06/16/2018  Medication Sig  . acetaminophen ([employer] PAIN RELIEF EXTRA STRENGTH) 500 MG tablet Take 500 mg by mouth every 6  (six) hours as needed (for pain.).  Marland Kitchen atorvastatin (LIPITOR) 20 MG tablet Take 20 mg by mouth every morning.   . Calcium Carb-Cholecalciferol (CALCIUM + D3) 600-200 MG-UNIT TABS Take 1 tablet by mouth 2 (two) times daily.  . carvedilol (COREG) 6.25 MG tablet Take 6.25 mg by mouth 2 (two) times daily with a meal.  . Cholecalciferol (VITAMIN D3) 50000 units CAPS TAKE 1 CAPSULE BY MOUTH ONE TIME PER WEEK  . folic acid (FOLVITE) 1 MG tablet Take 1 mg  by mouth daily.  . furosemide (LASIX) 40 MG tablet Take 40 mg by mouth as needed.   . gabapentin (NEURONTIN) 600 MG tablet Take 600 mg by mouth 2 (two) times daily. MAY TAKE 3RD CAPSULE IF NEEDED  . GLUCOSAMINE-CHONDROITIN PO Take 1 tablet by mouth 2 (two) times daily.  . Insulin Glargine (BASAGLAR KWIKPEN) 100 UNIT/ML SOPN INJECT 42 UNITS IN THE MORNING  . KLOR-CON M20 20 MEQ tablet Take 20 mEq by mouth as needed (When taking Lasix).   Marland Kitchen losartan (COZAAR) 100 MG tablet Take 100 mg by mouth daily.  . magnesium gluconate (MAGONATE) 30 MG tablet Take 30 mg as needed by mouth.  . methotrexate (RHEUMATREX) 2.5 MG tablet Take 20 mg by mouth every Sunday. Caution:Chemotherapy. Protect from light.   . Milnacipran HCl (SAVELLA) 100 MG TABS tablet Take 100 mg by mouth 2 (two) times daily.  . Multiple Vitamins-Minerals (CENTRUM SILVER PO) Take 1 tablet by mouth daily.  Marland Kitchen NOVOLOG MIX 70/30 FLEXPEN (70-30) 100 UNIT/ML FlexPen INJECT 30 UNITS SUBCUTANEOUSLY TWICE DAILY AT BREAKFAST AND DINNER  . omeprazole (PRILOSEC) 40 MG capsule TAKE ONE CAPSULE BY MOUTH ONCE A DAY  . rivaroxaban (XARELTO) 10 MG TABS tablet TAKE 1 TABLET (20 MG TOTAL) BY MOUTH DAILY WITH SUPPER.  Marland Kitchen rOPINIRole (REQUIP) 1 MG tablet Take 1 mg by mouth 2 (two) times daily. MAY TAKE 3RD TABLET AS NEEDED  . Vitamin D, Ergocalciferol, (DRISDOL) 50000 units CAPS capsule Take 50,000 Units by mouth every Wednesday.  . [DISCONTINUED] Cholecalciferol (VITAMIN D3) 50000 units TABS TAKE 1 TABLET BY MOUTH ONE  TIME PER WEEK  . [DISCONTINUED] rivaroxaban (XARELTO) 20 MG TABS tablet TAKE 1 TABLET (20 MG TOTAL) BY MOUTH DAILY WITH SUPPER.   Facility-Administered Encounter Medications as of 06/16/2018  Medication  . 0.9 %  sodium chloride infusion    ALLERGIES:  Allergies  Allergen Reactions  . Adhesive [Tape]     Adhesive tape and band-aids cause skin irritation  . Sulfa Antibiotics Nausea Only and Other (See Comments)    Joint paint  . Erythromycin Itching and Rash    burning     PHYSICAL EXAM:  ECOG Performance status: 1  Vitals:   06/16/18 1151  BP: (!) 159/61  Pulse: 95  Resp: 18  Temp: 98 F (36.7 C)  SpO2: 99%   Filed Weights   06/16/18 1151  Weight: 249 lb 6.4 oz (113.1 kg)    Physical Exam  Constitutional: She is oriented to person, place, and time. She appears well-developed and well-nourished.  Cardiovascular: Normal rate, regular rhythm and normal heart sounds.  Pulmonary/Chest: Effort normal and breath sounds normal.  Abdominal: Soft.  Musculoskeletal: Normal range of motion.  Neurological: She is alert and oriented to person, place, and time.  Skin: Skin is warm and dry.  Psychiatric: She has a normal mood and affect. Her behavior is normal. Judgment and thought content normal.  Abdomen: Soft nontender with no palpable organomegaly or masses.   LABORATORY DATA:  I have reviewed the labs as listed.  CBC    Component Value Date/Time   WBC 11.1 (H) 06/09/2018 1202   RBC 4.24 06/09/2018 1202   HGB 13.0 06/09/2018 1202   HCT 41.4 06/09/2018 1202   PLT 262 06/09/2018 1202   MCV 97.6 06/09/2018 1202   MCH 30.7 06/09/2018 1202   MCHC 31.4 06/09/2018 1202   RDW 13.4 06/09/2018 1202   LYMPHSABS 2.9 06/09/2018 1202   MONOABS 0.7 06/09/2018 1202   EOSABS 0.2  06/09/2018 1202   BASOSABS 0.1 06/09/2018 1202   CMP Latest Ref Rng & Units 06/09/2018 03/16/2018 11/05/2017  Glucose 70 - 99 mg/dL 103(H) 152(H) 182(H)  BUN 8 - 23 mg/dL 24(H) 27(H) 20  Creatinine  0.44 - 1.00 mg/dL 1.00 1.16(H) 1.10(H)  Sodium 135 - 145 mmol/L 142 141 136  Potassium 3.5 - 5.1 mmol/L 4.8 4.6 4.3  Chloride 98 - 111 mmol/L 105 103 97(L)  CO2 22 - 32 mmol/L 30 29 28   Calcium 8.9 - 10.3 mg/dL 9.5 9.1 9.4  Total Protein 6.5 - 8.1 g/dL 7.0 6.9 7.0  Total Bilirubin 0.3 - 1.2 mg/dL 0.9 0.7 0.5  Alkaline Phos 38 - 126 U/L 53 50 66  AST 15 - 41 U/L 27 28 28   ALT 0 - 44 U/L 33 29 34       ASSESSMENT & PLAN:   Malignant neoplasm of colon (HCC) 1.  Stage IIa (CT3CN0) colon cancer: - Status post right hemicolectomy on 10/10/2016, grade 2 adenocarcinoma, positive lymphovascular invasion, margins negative, 0 out of 19 lymph nodes positive. - Because of positive lymphovascular invasion she was recommended to have Xeloda.  She could not tolerate more than 2 cycles. - Last PET CT scan on February 22, 2018 was negative for any metastatic disease particularly the liver mass.  There was a 3.1 cm central liver mass on MRI from March 2019 which was not PET avid. - We discussed CEA level today which was 4.4.  Other blood work was within normal limits.  Physical examination did not reveal any abnormalities. - We will see her back in 3 months for follow-up with repeat CEA and a CT scan of the abdomen and pelvis.  We will continue CT scans once every 6 months.  2.  Pulmonary embolism: - She had a incidental PE on CT chest in August 2018.  From history it appears to be unprovoked.  She has completed full dose anticoagulation for more than a year.  I will cut her Xarelto to 10 mg daily.  3.  Iron deficiency state: - Today her hemoglobin was 13.  Ferritin was 96 and percent saturation was 32.  She received Injectafer on 09/06/2017 and 09/21/2017.      Orders placed this encounter:  Orders Placed This Encounter  Procedures  . CT Chest W Contrast  . CT Abdomen Pelvis W Contrast  . CEA  . Magnesium  . CBC with Differential/Platelet  . Comprehensive metabolic panel  . Vitamin B12  . Folate    . Ferritin  . Iron and TIBC      Derek Jack, MD [place] [phone]

## 2018-06-16 NOTE — Assessment & Plan Note (Signed)
1.  Stage IIa (CT3CN0) colon cancer: - Status post right hemicolectomy on 10/10/2016, grade 2 adenocarcinoma, positive lymphovascular invasion, margins negative, 0 out of 19 lymph nodes positive. - Because of positive lymphovascular invasion she was recommended to have Xeloda.  She could not tolerate more than 2 cycles. - Last PET CT scan on February 22, 2018 was negative for any metastatic disease particularly the liver mass.  There was a 3.1 cm central liver mass on MRI from March 2019 which was not PET avid. - We discussed CEA level today which was 4.4.  Other blood work was within normal limits.  Physical examination did not reveal any abnormalities. - We will see her back in 3 months for follow-up with repeat CEA and a CT scan of the abdomen and pelvis.  We will continue CT scans once every 6 months.  2.  Pulmonary embolism: - She had a incidental PE on CT chest in August 2018.  From history it appears to be unprovoked.  She has completed full dose anticoagulation for more than a year.  I will cut her Xarelto to 10 mg daily.  3.  Iron deficiency state: - Today her hemoglobin was 13.  Ferritin was 96 and percent saturation was 32.  She received Injectafer on 09/06/2017 and 09/21/2017.

## 2018-08-03 ENCOUNTER — Telehealth (HOSPITAL_COMMUNITY): Payer: Self-pay

## 2018-08-03 ENCOUNTER — Other Ambulatory Visit (HOSPITAL_COMMUNITY): Payer: Self-pay | Admitting: Nurse Practitioner

## 2018-08-03 DIAGNOSIS — C189 Malignant neoplasm of colon, unspecified: Secondary | ICD-10-CM

## 2018-08-03 DIAGNOSIS — I2699 Other pulmonary embolism without acute cor pulmonale: Secondary | ICD-10-CM

## 2018-08-03 NOTE — Telephone Encounter (Signed)
Spoke with Cherene Julian from Dr. Zadie Rhine office at [facility] in [place] regarding pt holding her Xarelto for upcoming orthopedic surgery. Reviewed with Dr. Delton Coombes and approval given for the pt to come off her Xarelto as needed for this surgery. [place] office notified of this information

## 2018-09-15 ENCOUNTER — Ambulatory Visit (HOSPITAL_COMMUNITY): Payer: Medicare Other | Admitting: Hematology

## 2018-09-16 ENCOUNTER — Inpatient Hospital Stay (HOSPITAL_COMMUNITY): Payer: Medicare Other | Attending: Hematology

## 2018-09-16 ENCOUNTER — Ambulatory Visit (HOSPITAL_COMMUNITY)
Admission: RE | Admit: 2018-09-16 | Discharge: 2018-09-16 | Disposition: A | Discharge status: Home / Self Care | Payer: Medicare Other | Source: Ambulatory Visit | Attending: Nurse Practitioner | Admitting: Nurse Practitioner

## 2018-09-16 DIAGNOSIS — R160 Hepatomegaly, not elsewhere classified: Secondary | ICD-10-CM | POA: Diagnosis not present

## 2018-09-16 DIAGNOSIS — E119 Type 2 diabetes mellitus without complications: Secondary | ICD-10-CM | POA: Insufficient documentation

## 2018-09-16 DIAGNOSIS — C182 Malignant neoplasm of ascending colon: Secondary | ICD-10-CM | POA: Diagnosis not present

## 2018-09-16 DIAGNOSIS — I2699 Other pulmonary embolism without acute cor pulmonale: Secondary | ICD-10-CM | POA: Diagnosis not present

## 2018-09-16 DIAGNOSIS — Z7901 Long term (current) use of anticoagulants: Secondary | ICD-10-CM | POA: Insufficient documentation

## 2018-09-16 DIAGNOSIS — E611 Iron deficiency: Secondary | ICD-10-CM | POA: Diagnosis not present

## 2018-09-16 LAB — IRON AND TIBC
Iron: 115 ug/dL (ref 28–170)
Saturation Ratios: 34 % — ABNORMAL HIGH (ref 10.4–31.8)
TIBC: 339 ug/dL (ref 250–450)
UIBC: 224 ug/dL

## 2018-09-16 LAB — CBC WITH DIFFERENTIAL/PLATELET
Abs Immature Granulocytes: 0.07 10*3/uL (ref 0.00–0.07)
Basophils Absolute: 0 10*3/uL (ref 0.0–0.1)
Basophils Relative: 0 %
Eosinophils Absolute: 0.1 10*3/uL (ref 0.0–0.5)
Eosinophils Relative: 0 %
HCT: 44.1 % (ref 36.0–46.0)
Hemoglobin: 13.8 g/dL (ref 12.0–15.0)
Immature Granulocytes: 1 %
Lymphocytes Relative: 16 %
Lymphs Abs: 2.3 10*3/uL (ref 0.7–4.0)
MCH: 30 pg (ref 26.0–34.0)
MCHC: 31.3 g/dL (ref 30.0–36.0)
MCV: 95.9 fL (ref 80.0–100.0)
Monocytes Absolute: 0.8 10*3/uL (ref 0.1–1.0)
Monocytes Relative: 5 %
Neutro Abs: 10.9 10*3/uL — ABNORMAL HIGH (ref 1.7–7.7)
Neutrophils Relative %: 78 %
Platelets: 258 10*3/uL (ref 150–400)
RBC: 4.6 MIL/uL (ref 3.87–5.11)
RDW: 13.2 % (ref 11.5–15.5)
WBC: 14.2 10*3/uL — ABNORMAL HIGH (ref 4.0–10.5)
nRBC: 0 % (ref 0.0–0.2)

## 2018-09-16 LAB — COMPREHENSIVE METABOLIC PANEL
ALT: 33 U/L (ref 0–44)
AST: 27 U/L (ref 15–41)
Albumin: 3.8 g/dL (ref 3.5–5.0)
Alkaline Phosphatase: 56 U/L (ref 38–126)
Anion gap: 11 (ref 5–15)
BUN: 33 mg/dL — ABNORMAL HIGH (ref 8–23)
CO2: 26 mmol/L (ref 22–32)
Calcium: 9 mg/dL (ref 8.9–10.3)
Chloride: 100 mmol/L (ref 98–111)
Creatinine, Ser: 1 mg/dL (ref 0.44–1.00)
GFR calc Af Amer: >60 mL/min (ref >60)
GFR calc non Af Amer: 55 mL/min — ABNORMAL LOW (ref >60)
Glucose, Bld: 249 mg/dL — ABNORMAL HIGH (ref 70–99)
Potassium: 3.9 mmol/L (ref 3.5–5.1)
Sodium: 137 mmol/L (ref 135–145)
Total Bilirubin: 0.9 mg/dL (ref 0.3–1.2)
Total Protein: 7.3 g/dL (ref 6.5–8.1)

## 2018-09-16 LAB — VITAMIN B12: Vitamin B-12: 569 pg/mL (ref 180–914)

## 2018-09-16 LAB — FOLATE: Folate: 15.5 ng/mL (ref >5.9)

## 2018-09-16 LAB — MAGNESIUM: Magnesium: 2 mg/dL (ref 1.7–2.4)

## 2018-09-16 LAB — FERRITIN: Ferritin: 95 ng/mL (ref 11–307)

## 2018-09-16 MED ORDER — IOPAMIDOL (ISOVUE-300) INJECTION 61%
100.0000 mL | Freq: Once | INTRAVENOUS | Status: AC | PRN
  Administered 2018-09-16: 100 mL via INTRAVENOUS

## 2018-09-17 LAB — CEA: CEA: 4.6 ng/mL (ref 0.0–4.7)

## 2018-09-20 ENCOUNTER — Encounter (HOSPITAL_COMMUNITY): Payer: Self-pay | Admitting: Hematology

## 2018-09-20 ENCOUNTER — Other Ambulatory Visit: Payer: Self-pay

## 2018-09-20 ENCOUNTER — Inpatient Hospital Stay (HOSPITAL_COMMUNITY): Payer: Medicare Other | Attending: Hematology | Admitting: Hematology

## 2018-09-20 VITALS — BP 143/64 | HR 88 | Temp 97.7°F | Resp 16 | Wt 246.5 lb

## 2018-09-20 DIAGNOSIS — C182 Malignant neoplasm of ascending colon: Secondary | ICD-10-CM

## 2018-09-20 DIAGNOSIS — Z7901 Long term (current) use of anticoagulants: Secondary | ICD-10-CM

## 2018-09-20 DIAGNOSIS — D649 Anemia, unspecified: Secondary | ICD-10-CM

## 2018-09-20 DIAGNOSIS — I2699 Other pulmonary embolism without acute cor pulmonale: Secondary | ICD-10-CM | POA: Diagnosis not present

## 2018-09-20 DIAGNOSIS — E119 Type 2 diabetes mellitus without complications: Secondary | ICD-10-CM

## 2018-09-20 DIAGNOSIS — Z87891 Personal history of nicotine dependence: Secondary | ICD-10-CM

## 2018-09-20 DIAGNOSIS — R197 Diarrhea, unspecified: Secondary | ICD-10-CM

## 2018-09-20 DIAGNOSIS — E611 Iron deficiency: Secondary | ICD-10-CM

## 2018-09-20 NOTE — Patient Instructions (Signed)
[facility] Discharge Instructions     Thank you for choosing [facility] at [facility] to provide your oncology and hematology care.  To afford each patient quality time with our provider, please arrive at least 15 minutes before your scheduled appointment time.   If you have a lab appointment with the Cancer Center please come in thru the  Main Entrance and check in at the main information desk  You need to re-schedule your appointment should you arrive 10 or more minutes late.  We strive to give you quality time with our providers, and arriving late affects you and other patients whose appointments are after yours.  Also, if you no show three or more times for appointments you may be dismissed from the clinic at the providers discretion.     Again, thank you for choosing [facility].  Our hope is that these requests will decrease the amount of time that you wait before being seen by our physicians.       _____________________________________________________________  Should you have questions after your visit to [facility], please contact our office at [phone] between the hours of 8:00 a.m. and 4:30 p.m.  Voicemails left after 4:00 p.m. will not be returned until the following business day.  For prescription refill requests, have your pharmacy contact our office and allow 72 hours.    Cancer Center Support Programs:   > Cancer Support Group  2nd Tuesday of the month 1pm-2pm, Journey Room    

## 2018-09-20 NOTE — Progress Notes (Signed)
[place], [place] [address]   CLINIC:  Medical Oncology/Hematology  PCP:  Earney Mallet, MD [address] [place] [address] [phone]   REASON FOR VISIT: Follow-up for stage IIA colon cancer  CURRENT THERAPY: Observation  BRIEF ONCOLOGIC HISTORY:    Colon cancer ([place])   10/10/2016 Initial Diagnosis    Colon cancer ([place])    10/10/2016 Surgery    Partial colectomy by Dr. Arnoldo Morale  Pathology shows  a 2.1 cm grade 2 adenocarcinoma of the ascending colon with invasion through the muscularis propria into the peri-colorectal tissues.       11/17/2016 PET scan    The hepatic lesion seen on the CT scan and MRI does not demonstrate hypermetabolism and is likely a benign entity.  Right maxillary sinus disease with associated hypermetabolism.  Scattered pulmonary nodules, likely benign. No follow-up needed if patient is low-risk (and has no known or suspected primary neoplasm). Non-contrast chest CT can be considered in 12 months if patient is high-risk.     12/25/2016 - 02/04/2017 Adjuvant Chemotherapy    Xeloda 2000mg  PO BID take for 14 days out of 21 days. Plan for total of 24 weeks of treatment.      CANCER STAGING: Cancer Staging Colon cancer [facility]) Staging form: Colon and Rectum, AJCC 8th Edition - Clinical: Stage IIA (cT3, cN0, cM0) - Signed by [name], MD on 11/26/2016    INTERVAL HISTORY:  Wanda Curry 73 y.o. female returns for routine follow-up for colon cancer. She is here today with her daughter. She is wearing an ankle brace where she fell and broke her ankle in December. She is also having a lot of diarrhea. She is taking imodium every other day to help manage it. She is very low on energy and isn't able to do any of her daily activities due to the fatigue. She was mostly in the bed over the past few weeks. Denies any nausea, vomiting, or diarrhea. Denies any new pains. Had not noticed any recent bleeding such as  epistaxis, hematuria or hematochezia. Denies recent chest pain on exertion, shortness of breath on minimal exertion, pre-syncopal episodes, or palpitations. Denies any numbness or tingling in hands or feet. Denies any recent fevers, infections, or recent hospitalizations. Patient reports appetite at 100% and energy level at 0%.   REVIEW OF SYSTEMS:  Review of Systems  Gastrointestinal: Positive for diarrhea.  All other systems reviewed and are negative.    PAST MEDICAL/SURGICAL HISTORY:  Past Medical History:  Diagnosis Date  . Anemia   . Arthritis    RA  . Cancer ([place])    skin cancer  . Depression   . Diabetes mellitus without complication ([place])   . Fibromyalgia   . GERD (gastroesophageal reflux disease)   . Heart murmur    ECHO scheduled 05-25-2015  . Hyperlipidemia   . Neuropathy   . PONV (postoperative nausea and vomiting)    Past Surgical History:  Procedure Laterality Date  . ABDOMINAL HYSTERECTOMY    . BIOPSY  09/23/2016   Procedure: BIOPSY;  Surgeon: Danie Binder, MD;  [facility];  Service: Endoscopy;;  hepatic flexure mass  . CHOLECYSTECTOMY    . COLONOSCOPY N/A 08/13/2017   Procedure: COLONOSCOPY;  Surgeon: Daneil Dolin, MD;  [facility];  Service: Endoscopy;  Laterality: N/A;  . COLONOSCOPY WITH PROPOFOL N/A 09/23/2016   Procedure: COLONOSCOPY WITH PROPOFOL;  Surgeon: Danie Binder, MD;  [facility];  Service: Endoscopy;  Laterality: N/A;  7:30 am  . HARDWARE REMOVAL Right 05/30/2015   Procedure: HARDWARE REMOVAL;  Surgeon: Ninetta Lights, MD;  Location: [place];  Service: Orthopedics;  Laterality: Right;  . PARTIAL COLECTOMY N/A 10/10/2016   Procedure: PARTIAL COLECTOMY;  Surgeon: Aviva Signs, MD;  Location: [facility];  Service: General;  Laterality: N/A;  . planter warts Bilateral    both feet  . POLYPECTOMY  09/23/2016   Procedure: POLYPECTOMY;  Surgeon: Danie Binder, MD;  [facility];  Service: Endoscopy;;   transverse colon polyps times 2, rectal polyp  . skin grafts     due to planter warts  . TOTAL KNEE ARTHROPLASTY Right 05/30/2015   Procedure: RIGHT TOTAL KNEE ARTHROPLASTY;  Surgeon: Ninetta Lights, MD;  Location: [place];  Service: Orthopedics;  Laterality: Right;     SOCIAL HISTORY:  Social History   Socioeconomic History  . Marital status: Widowed    Spouse name: Not on file  . Number of children: Not on file  . Years of education: Not on file  . Highest education level: Not on file  Occupational History  . Not on file  Social Needs  . Financial resource strain: Not on file  . Food insecurity:    Worry: Not on file    Inability: Not on file  . Transportation needs:    Medical: Not on file    Non-medical: Not on file  Tobacco Use  . Smoking status: Former Smoker    Packs/day: 1.00    Years: 40.00    Pack years: 40.00    Types: Cigarettes    Last attempt to quit: 08/18/2004    Years since quitting: 14.0  . Smokeless tobacco: Never Used  Substance and Sexual Activity  . Alcohol use: No  . Drug use: No  . Sexual activity: Not Currently    Birth control/protection: Surgical  Lifestyle  . Physical activity:    Days per week: Not on file    Minutes per session: Not on file  . Stress: Not on file  Relationships  . Social connections:    Talks on phone: Not on file    Gets together: Not on file    Attends religious service: Not on file    Active member of club or organization: Not on file    Attends meetings of clubs or organizations: Not on file    Relationship status: Not on file  . Intimate partner violence:    Fear of current or ex partner: Not on file    Emotionally abused: Not on file    Physically abused: Not on file    Forced sexual activity: Not on file  Other Topics Concern  . Not on file  Social History Narrative  . Not on file    FAMILY HISTORY:  Family History  Problem Relation Age of Onset  . Colon cancer Neg Hx     CURRENT MEDICATIONS:    Outpatient Encounter Medications as of 09/20/2018  Medication Sig  . acetaminophen ([employer] PAIN RELIEF EXTRA STRENGTH) 500 MG tablet Take 500 mg by mouth every 6 (six) hours as needed (for pain.).  Marland Kitchen atorvastatin (LIPITOR) 20 MG tablet Take 20 mg by mouth every morning.   . Calcium Carb-Cholecalciferol (CALCIUM + D3) 600-200 MG-UNIT TABS Take 1 tablet by mouth 2 (two) times daily.  . carvedilol (COREG) 6.25 MG tablet Take 6.25 mg by mouth 2 (two) times daily with a meal.  .  folic acid (FOLVITE) 1 MG tablet Take 1 mg by mouth daily.  . furosemide (LASIX) 40 MG tablet Take 20 mg by mouth as needed.   . gabapentin (NEURONTIN) 600 MG tablet Take 600 mg by mouth 2 (two) times daily. MAY TAKE 3RD CAPSULE IF NEEDED  . GLUCOSAMINE-CHONDROITIN PO Take 1 tablet by mouth 2 (two) times daily.  . Insulin Glargine (BASAGLAR KWIKPEN) 100 UNIT/ML SOPN INJECT 42 UNITS IN THE MORNING  . KLOR-CON M20 20 MEQ tablet Take 20 mEq by mouth as needed (When taking Lasix).   Marland Kitchen losartan (COZAAR) 100 MG tablet Take 25 mg by mouth daily.   . methotrexate (RHEUMATREX) 2.5 MG tablet Take 20 mg by mouth every Sunday. Caution:Chemotherapy. Protect from light.   . Milnacipran HCl (SAVELLA) 100 MG TABS tablet Take 100 mg by mouth 2 (two) times daily.  . Multiple Vitamins-Minerals (CENTRUM SILVER PO) Take 1 tablet by mouth daily.  Marland Kitchen NOVOLOG MIX 70/30 FLEXPEN (70-30) 100 UNIT/ML FlexPen INJECT 30 UNITS SUBCUTANEOUSLY TWICE DAILY AT BREAKFAST AND DINNER  . omeprazole (PRILOSEC) 40 MG capsule TAKE ONE CAPSULE BY MOUTH ONCE A DAY  . rivaroxaban (XARELTO) 10 MG TABS tablet TAKE 1 TABLET (20 MG TOTAL) BY MOUTH DAILY WITH SUPPER.  Marland Kitchen rOPINIRole (REQUIP) 1 MG tablet Take 1 mg by mouth 2 (two) times daily. MAY TAKE 3RD TABLET AS NEEDED  . Vitamin D, Ergocalciferol, (DRISDOL) 50000 units CAPS capsule Take 50,000 Units by mouth every Wednesday.  . [DISCONTINUED] Cholecalciferol (VITAMIN D3) 50000 units CAPS TAKE 1 CAPSULE BY MOUTH ONE TIME PER  WEEK  . magnesium gluconate (MAGONATE) 30 MG tablet Take 30 mg as needed by mouth.  . [DISCONTINUED] XARELTO 20 MG TABS tablet TAKE 1 TABLET (20 MG TOTAL) BY MOUTH DAILY WITH SUPPER.   Facility-Administered Encounter Medications as of 09/20/2018  Medication  . 0.9 %  sodium chloride infusion    ALLERGIES:  Allergies  Allergen Reactions  . Adhesive [Tape]     Adhesive tape and band-aids cause skin irritation  . Sulfa Antibiotics Nausea Only and Other (See Comments)    Joint paint  . Erythromycin Itching and Rash    burning     PHYSICAL EXAM:  ECOG Performance status: 1  Vitals:   09/20/18 1000  BP: (!) 143/64  Pulse: 88  Resp: 16  Temp: 97.7 F (36.5 C)  SpO2: 100%   Filed Weights   09/20/18 1000  Weight: 246 lb 8 oz (111.8 kg)    Physical Exam Constitutional:      Appearance: Normal appearance. She is normal weight.  Abdominal:     General: Abdomen is flat.     Palpations: Abdomen is soft.  Musculoskeletal: Normal range of motion.  Skin:    General: Skin is warm and dry.  Neurological:     Mental Status: She is alert and oriented to person, place, and time. Mental status is at baseline.  Psychiatric:        Mood and Affect: Mood normal.        Behavior: Behavior normal.        Thought Content: Thought content normal.        Judgment: Judgment normal.    Abdomen: No palpable hepatosplenomegaly. Extremities: No edema or cyanosis. No lymphadenopathy.  LABORATORY DATA:  I have reviewed the labs as listed.  CBC    Component Value Date/Time   WBC 14.2 (H) 09/16/2018 0914   RBC 4.60 09/16/2018 0914   HGB 13.8 09/16/2018 0914  HCT 44.1 09/16/2018 0914   PLT 258 09/16/2018 0914   MCV 95.9 09/16/2018 0914   MCH 30.0 09/16/2018 0914   MCHC 31.3 09/16/2018 0914   RDW 13.2 09/16/2018 0914   LYMPHSABS 2.3 09/16/2018 0914   MONOABS 0.8 09/16/2018 0914   EOSABS 0.1 09/16/2018 0914   BASOSABS 0.0 09/16/2018 0914   CMP Latest Ref Rng & Units 09/16/2018  06/09/2018 03/16/2018  Glucose 70 - 99 mg/dL 249(H) 103(H) 152(H)  BUN 8 - 23 mg/dL 33(H) 24(H) 27(H)  Creatinine 0.44 - 1.00 mg/dL 1.00 1.00 1.16(H)  Sodium 135 - 145 mmol/L 137 142 141  Potassium 3.5 - 5.1 mmol/L 3.9 4.8 4.6  Chloride 98 - 111 mmol/L 100 105 103  CO2 22 - 32 mmol/L 26 30 29   Calcium 8.9 - 10.3 mg/dL 9.0 9.5 9.1  Total Protein 6.5 - 8.1 g/dL 7.3 7.0 6.9  Total Bilirubin 0.3 - 1.2 mg/dL 0.9 0.9 0.7  Alkaline Phos 38 - 126 U/L 56 53 50  AST 15 - 41 U/L 27 27 28   ALT 0 - 44 U/L 33 33 29       DIAGNOSTIC IMAGING:  I have independently reviewed the scans and discussed with the patient.   I have reviewed Francene Finders, NP's note and agree with the documentation.  I personally performed a face-to-face visit, made revisions and my assessment and plan is as follows.    ASSESSMENT & PLAN:   Malignant neoplasm of colon ([place]) 1.  Stage IIa (CT3CN0) colon cancer: - Right hemicolectomy on 10/10/2016, grade 2 adenocarcinoma: Positive lymphovascular invasion, margins negative, 0 out of 19 lymph nodes positive.  - Because of positive lymphovascular invasion, she was recommended to have Xeloda.  She could not tolerate more than 2 cycles.  - Last PET CT scan on February 22, 2018 was negative for any metastatic disease particularly the liver mass.  There was a 3.1 cm central liver mass on MRI from March 2019 which was not PET avid. - She denies any bowel changes.  She continues to have diarrhea since surgery.  She takes Imodium as needed. - We discussed the results of the blood work today.  CEA was 4.6. -CT abdomen and pelvis dated 09/16/2018 shows caudate lobe of the liver lesion measuring 4.3 cm.  This previously measured 3.1 cm on MRI from 11/02/2017.  Prior PET CT scan was negative.  Given the previous findings on the MRI, this was favored to be benign fat-containing lesion such as atypical focal nodular hyperplasia versus progressive focal fatty infiltration.  Remainder of the exam did  not show any evidence of metastatic disease. -I plan to see her back in 3 months for follow-up with repeat CEA level.  We will plan to do CT scans in 6 months.  2.  Unprovoked PE: - She had an incidental PE on CT chest in August 2018, from history it appears unprovoked.  This reportedly happened 6 months after her knee replacement. -She was on full anticoagulation with Xarelto 20 mg for almost a year. -Xarelto dose was changed to 10 mg daily in October 2019.  Patient's nosebleeds have improved after the dose reduction.  3.  Iron deficiency state: - Last received Injectafer on 09/06/2017 and 09/21/2017. - Hemoglobin today 13.8.  Ferritin was 95.  However patient complains of severe tiredness. -I have recommended one Feraheme infusion to see if it helps.      Orders placed this encounter:  Orders Placed This Encounter  Procedures  . CEA  .  CBC with Differential/Platelet  . Comprehensive metabolic panel  . Ferritin  . Iron and TIBC  . Vitamin B12  . Folate      Derek Jack, MD [place] [phone]

## 2018-09-20 NOTE — Assessment & Plan Note (Signed)
1.  Stage IIa (CT3CN0) colon cancer: - Right hemicolectomy on 10/10/2016, grade 2 adenocarcinoma: Positive lymphovascular invasion, margins negative, 0 out of 19 lymph nodes positive.  - Because of positive lymphovascular invasion, she was recommended to have Xeloda.  She could not tolerate more than 2 cycles.  - Last PET CT scan on February 22, 2018 was negative for any metastatic disease particularly the liver mass.  There was a 3.1 cm central liver mass on MRI from March 2019 which was not PET avid. - She denies any bowel changes.  She continues to have diarrhea since surgery.  She takes Imodium as needed. - We discussed the results of the blood work today.  CEA was 4.6. -CT abdomen and pelvis dated 09/16/2018 shows caudate lobe of the liver lesion measuring 4.3 cm.  This previously measured 3.1 cm on MRI from 11/02/2017.  Prior PET CT scan was negative.  Given the previous findings on the MRI, this was favored to be benign fat-containing lesion such as atypical focal nodular hyperplasia versus progressive focal fatty infiltration.  Remainder of the exam did not show any evidence of metastatic disease. -I plan to see her back in 3 months for follow-up with repeat CEA level.  We will plan to do CT scans in 6 months.  2.  Unprovoked PE: - She had an incidental PE on CT chest in August 2018, from history it appears unprovoked.  This reportedly happened 6 months after her knee replacement. -She was on full anticoagulation with Xarelto 20 mg for almost a year. -Xarelto dose was changed to 10 mg daily in October 2019.  Patient's nosebleeds have improved after the dose reduction.  3.  Iron deficiency state: - Last received Injectafer on 09/06/2017 and 09/21/2017. - Hemoglobin today 13.8.  Ferritin was 95.  However patient complains of severe tiredness. -I have recommended one Feraheme infusion to see if it helps.

## 2018-09-22 ENCOUNTER — Inpatient Hospital Stay (HOSPITAL_COMMUNITY): Payer: Medicare Other

## 2018-09-22 ENCOUNTER — Encounter (HOSPITAL_COMMUNITY): Payer: Self-pay

## 2018-09-22 VITALS — BP 119/48 | HR 77 | Temp 96.7°F | Resp 18

## 2018-09-22 DIAGNOSIS — C182 Malignant neoplasm of ascending colon: Secondary | ICD-10-CM | POA: Diagnosis not present

## 2018-09-22 DIAGNOSIS — K5791 Diverticulosis of intestine, part unspecified, without perforation or abscess with bleeding: Secondary | ICD-10-CM

## 2018-09-22 MED ORDER — SODIUM CHLORIDE 0.9 % IV SOLN
510.0000 mg | Freq: Once | INTRAVENOUS | Status: AC
  Administered 2018-09-22: 510 mg via INTRAVENOUS
  Filled 2018-09-22: qty 510

## 2018-09-22 MED ORDER — SODIUM CHLORIDE 0.9 % IV SOLN
INTRAVENOUS | Status: DC
  Administered 2018-09-22: 09:00:00 via INTRAVENOUS

## 2018-09-22 NOTE — Patient Instructions (Signed)
[facility] at [facility] Discharge Instructions  Received Feraheme infusion today. Follow-up as scheduled. Call clinic for any questions or concerns   Thank you for choosing [facility] at [facility] to provide your oncology and hematology care.  To afford each patient quality time with our provider, please arrive at least 15 minutes before your scheduled appointment time.   If you have a lab appointment with the Cancer Center please come in thru the  Main Entrance and check in at the main information desk  You need to re-schedule your appointment should you arrive 10 or more minutes late.  We strive to give you quality time with our providers, and arriving late affects you and other patients whose appointments are after yours.  Also, if you no show three or more times for appointments you may be dismissed from the clinic at the providers discretion.     Again, thank you for choosing [facility].  Our hope is that these requests will decrease the amount of time that you wait before being seen by our physicians.       _____________________________________________________________  Should you have questions after your visit to [facility], please contact our office at [phone] between the hours of 8:00 a.m. and 4:30 p.m.  Voicemails left after 4:00 p.m. will not be returned until the following business day.  For prescription refill requests, have your pharmacy contact our office and allow 72 hours.    Cancer Center Support Programs:   > Cancer Support Group  2nd Tuesday of the month 1pm-2pm, Journey Room   

## 2018-09-22 NOTE — Progress Notes (Signed)
Wanda Curry tolerated Feraheme infusion well without complaints or incident. VSS upon discharge. Peripheral IV with positive blood return prior to and after infusion. Pt discharged self ambulatory in satisfactory condition accompanied by her daughter

## 2018-09-23 ENCOUNTER — Other Ambulatory Visit: Payer: Self-pay | Admitting: Nurse Practitioner

## 2018-12-20 ENCOUNTER — Inpatient Hospital Stay (HOSPITAL_COMMUNITY): Payer: Medicare Other | Attending: Hematology

## 2018-12-20 ENCOUNTER — Other Ambulatory Visit: Payer: Self-pay

## 2018-12-20 DIAGNOSIS — C182 Malignant neoplasm of ascending colon: Secondary | ICD-10-CM

## 2018-12-20 DIAGNOSIS — Z9049 Acquired absence of other specified parts of digestive tract: Secondary | ICD-10-CM | POA: Diagnosis not present

## 2018-12-20 DIAGNOSIS — Z794 Long term (current) use of insulin: Secondary | ICD-10-CM | POA: Diagnosis not present

## 2018-12-20 DIAGNOSIS — Z9221 Personal history of antineoplastic chemotherapy: Secondary | ICD-10-CM | POA: Diagnosis not present

## 2018-12-20 DIAGNOSIS — Z87891 Personal history of nicotine dependence: Secondary | ICD-10-CM | POA: Diagnosis not present

## 2018-12-20 DIAGNOSIS — Z79899 Other long term (current) drug therapy: Secondary | ICD-10-CM | POA: Diagnosis not present

## 2018-12-20 DIAGNOSIS — E119 Type 2 diabetes mellitus without complications: Secondary | ICD-10-CM | POA: Diagnosis not present

## 2018-12-20 DIAGNOSIS — D649 Anemia, unspecified: Secondary | ICD-10-CM

## 2018-12-20 LAB — COMPREHENSIVE METABOLIC PANEL
ALT: 28 U/L (ref 0–44)
AST: 25 U/L (ref 15–41)
Albumin: 3.4 g/dL — ABNORMAL LOW (ref 3.5–5.0)
Alkaline Phosphatase: 53 U/L (ref 38–126)
Anion gap: 9 (ref 5–15)
BUN: 17 mg/dL (ref 8–23)
CO2: 27 mmol/L (ref 22–32)
Calcium: 9 mg/dL (ref 8.9–10.3)
Chloride: 104 mmol/L (ref 98–111)
Creatinine, Ser: 1 mg/dL (ref 0.44–1.00)
GFR calc Af Amer: >60 mL/min (ref >60)
GFR calc non Af Amer: 55 mL/min — ABNORMAL LOW (ref >60)
Glucose, Bld: 131 mg/dL — ABNORMAL HIGH (ref 70–99)
Potassium: 4.5 mmol/L (ref 3.5–5.1)
Sodium: 140 mmol/L (ref 135–145)
Total Bilirubin: 0.6 mg/dL (ref 0.3–1.2)
Total Protein: 6.6 g/dL (ref 6.5–8.1)

## 2018-12-20 LAB — CBC WITH DIFFERENTIAL/PLATELET
Abs Immature Granulocytes: 0.04 10*3/uL (ref 0.00–0.07)
Basophils Absolute: 0 10*3/uL (ref 0.0–0.1)
Basophils Relative: 0 %
Eosinophils Absolute: 0.2 10*3/uL (ref 0.0–0.5)
Eosinophils Relative: 3 %
HCT: 39.4 % (ref 36.0–46.0)
Hemoglobin: 12.3 g/dL (ref 12.0–15.0)
Immature Granulocytes: 0 %
Lymphocytes Relative: 18 %
Lymphs Abs: 1.6 10*3/uL (ref 0.7–4.0)
MCH: 31.1 pg (ref 26.0–34.0)
MCHC: 31.2 g/dL (ref 30.0–36.0)
MCV: 99.7 fL (ref 80.0–100.0)
Monocytes Absolute: 0.7 10*3/uL (ref 0.1–1.0)
Monocytes Relative: 8 %
Neutro Abs: 6.3 10*3/uL (ref 1.7–7.7)
Neutrophils Relative %: 71 %
Platelets: 231 10*3/uL (ref 150–400)
RBC: 3.95 MIL/uL (ref 3.87–5.11)
RDW: 14.3 % (ref 11.5–15.5)
WBC: 8.9 10*3/uL (ref 4.0–10.5)
nRBC: 0 % (ref 0.0–0.2)

## 2018-12-20 LAB — IRON AND TIBC
Iron: 127 ug/dL (ref 28–170)
Saturation Ratios: 44 % — ABNORMAL HIGH (ref 10.4–31.8)
TIBC: 289 ug/dL (ref 250–450)
UIBC: 162 ug/dL

## 2018-12-20 LAB — FOLATE: Folate: 95 ng/mL (ref >5.9)

## 2018-12-20 LAB — FERRITIN: Ferritin: 135 ng/mL (ref 11–307)

## 2018-12-20 LAB — VITAMIN B12: Vitamin B-12: 621 pg/mL (ref 180–914)

## 2018-12-21 LAB — CEA: CEA: 4.7 ng/mL (ref 0.0–4.7)

## 2018-12-27 ENCOUNTER — Encounter (HOSPITAL_COMMUNITY): Payer: Self-pay | Admitting: Hematology

## 2018-12-27 ENCOUNTER — Inpatient Hospital Stay (HOSPITAL_BASED_OUTPATIENT_CLINIC_OR_DEPARTMENT_OTHER): Payer: Medicare Other | Admitting: Hematology

## 2018-12-27 ENCOUNTER — Other Ambulatory Visit: Payer: Self-pay

## 2018-12-27 VITALS — BP 131/56 | HR 87 | Temp 97.7°F | Resp 20 | Wt 254.0 lb

## 2018-12-27 DIAGNOSIS — C182 Malignant neoplasm of ascending colon: Secondary | ICD-10-CM | POA: Diagnosis not present

## 2018-12-27 DIAGNOSIS — Z794 Long term (current) use of insulin: Secondary | ICD-10-CM

## 2018-12-27 DIAGNOSIS — Z9049 Acquired absence of other specified parts of digestive tract: Secondary | ICD-10-CM

## 2018-12-27 DIAGNOSIS — E119 Type 2 diabetes mellitus without complications: Secondary | ICD-10-CM

## 2018-12-27 DIAGNOSIS — Z87891 Personal history of nicotine dependence: Secondary | ICD-10-CM

## 2018-12-27 DIAGNOSIS — Z9221 Personal history of antineoplastic chemotherapy: Secondary | ICD-10-CM

## 2018-12-27 DIAGNOSIS — Z79899 Other long term (current) drug therapy: Secondary | ICD-10-CM

## 2018-12-27 NOTE — Patient Instructions (Addendum)
[facility] at [facility] Discharge Instructions  You were seen today by Dr. Katragadda. He went over your recent lab results. He will see you back in 3 months for labs and follow up.   Thank you for choosing [facility] at [facility] to provide your oncology and hematology care.  To afford each patient quality time with our provider, please arrive at least 15 minutes before your scheduled appointment time.   If you have a lab appointment with the Cancer Center please come in thru the  Main Entrance and check in at the main information desk  You need to re-schedule your appointment should you arrive 10 or more minutes late.  We strive to give you quality time with our providers, and arriving late affects you and other patients whose appointments are after yours.  Also, if you no show three or more times for appointments you may be dismissed from the clinic at the providers discretion.     Again, thank you for choosing [facility].  Our hope is that these requests will decrease the amount of time that you wait before being seen by our physicians.       _____________________________________________________________  Should you have questions after your visit to [facility], please contact our office at [phone] between the hours of 8:00 a.m. and 4:30 p.m.  Voicemails left after 4:00 p.m. will not be returned until the following business day.  For prescription refill requests, have your pharmacy contact our office and allow 72 hours.    Cancer Center Support Programs:   > Cancer Support Group  2nd Tuesday of the month 1pm-2pm, Journey Room    

## 2018-12-27 NOTE — Progress Notes (Signed)
[address], [place] [id number]   CLINIC:  Medical Oncology/Hematology  PCP:  Earney Mallet, MD [address] [place] [address] [phone]   REASON FOR VISIT:  Follow-up for colon cancer and iron deficiency state.   BRIEF ONCOLOGIC HISTORY:    Colon cancer ([place])   10/10/2016 Initial Diagnosis    Colon cancer ([place])    10/10/2016 Surgery    Partial colectomy by Dr. Arnoldo Morale  Pathology shows  a 2.1 cm grade 2 adenocarcinoma of the ascending colon with invasion through the muscularis propria into the peri-colorectal tissues.       11/17/2016 PET scan    The hepatic lesion seen on the CT scan and MRI does not demonstrate hypermetabolism and is likely a benign entity.  Right maxillary sinus disease with associated hypermetabolism.  Scattered pulmonary nodules, likely benign. No follow-up needed if patient is low-risk (and has no known or suspected primary neoplasm). Non-contrast chest CT can be considered in 12 months if patient is high-risk.     12/25/2016 - 02/04/2017 Adjuvant Chemotherapy    Xeloda 2000mg  PO BID take for 14 days out of 21 days. Plan for total of 24 weeks of treatment.      CANCER STAGING: Cancer Staging Colon cancer [facility] Staging form: Colon and Rectum, AJCC 8th Edition - Clinical: Stage IIA (cT3, cN0, cM0) - Signed by [name], MD on 11/26/2016    INTERVAL HISTORY:  Ms. Winne 73 y.o. female returns for routine follow-up. She is her today alone. She states that she continues to experience diarrhea and takes imodium as needed. She states that she had the flu since she saw [place] last. She states that she did not experience any change after the infusions. Denies any nausea, or vomiting. Denies any new pains. Had not noticed any recent bleeding such as epistaxis, hematuria or hematochezia. Denies recent chest pain on exertion, shortness of breath on minimal exertion, pre-syncopal episodes, or palpitations. Denies  any numbness or tingling in hands or feet. Denies any recent fevers, infections, or recent hospitalizations. Patient reports appetite at 100% and energy level at 25%.    REVIEW OF SYSTEMS:  Review of Systems  Constitutional: Positive for fatigue.  Gastrointestinal: Positive for diarrhea.     PAST MEDICAL/SURGICAL HISTORY:  Past Medical History:  Diagnosis Date  . Anemia   . Arthritis    RA  . Cancer ([place])    skin cancer  . Depression   . Diabetes mellitus without complication ([place])   . Fibromyalgia   . GERD (gastroesophageal reflux disease)   . Heart murmur    ECHO scheduled 05-25-2015  . Hyperlipidemia   . Neuropathy   . PONV (postoperative nausea and vomiting)    Past Surgical History:  Procedure Laterality Date  . ABDOMINAL HYSTERECTOMY    . BIOPSY  09/23/2016   Procedure: BIOPSY;  Surgeon: Danie Binder, MD;  [facility];  Service: Endoscopy;;  hepatic flexure mass  . CHOLECYSTECTOMY    . COLONOSCOPY N/A 08/13/2017   Procedure: COLONOSCOPY;  Surgeon: Daneil Dolin, MD;  [facility];  Service: Endoscopy;  Laterality: N/A;  . COLONOSCOPY WITH PROPOFOL N/A 09/23/2016   Procedure: COLONOSCOPY WITH PROPOFOL;  Surgeon: Danie Binder, MD;  [facility];  Service: Endoscopy;  Laterality: N/A;  7:30 am  . HARDWARE REMOVAL Right 05/30/2015   Procedure: HARDWARE REMOVAL;  Surgeon: Ninetta Lights, MD;  Location: [facility];  Service: Orthopedics;  Laterality: Right;  . PARTIAL COLECTOMY N/A 10/10/2016   Procedure: PARTIAL COLECTOMY;  Surgeon: Aviva Signs, MD;  Location: [facility];  Service: General;  Laterality: N/A;  . planter warts Bilateral    both feet  . POLYPECTOMY  09/23/2016   Procedure: POLYPECTOMY;  Surgeon: Danie Binder, MD;  [facility];  Service: Endoscopy;;  transverse colon polyps times 2, rectal polyp  . skin grafts     due to planter warts  . TOTAL KNEE ARTHROPLASTY Right 05/30/2015   Procedure: RIGHT TOTAL KNEE  ARTHROPLASTY;  Surgeon: Ninetta Lights, MD;  Location: [facility];  Service: Orthopedics;  Laterality: Right;     SOCIAL HISTORY:  Social History   Socioeconomic History  . Marital status: Widowed    Spouse name: Not on file  . Number of children: Not on file  . Years of education: Not on file  . Highest education level: Not on file  Occupational History  . Not on file  Social Needs  . Financial resource strain: Not on file  . Food insecurity:    Worry: Not on file    Inability: Not on file  . Transportation needs:    Medical: Not on file    Non-medical: Not on file  Tobacco Use  . Smoking status: Former Smoker    Packs/day: 1.00    Years: 40.00    Pack years: 40.00    Types: Cigarettes    Last attempt to quit: 08/18/2004    Years since quitting: 14.3  . Smokeless tobacco: Never Used  Substance and Sexual Activity  . Alcohol use: No  . Drug use: No  . Sexual activity: Not Currently    Birth control/protection: Surgical  Lifestyle  . Physical activity:    Days per week: Not on file    Minutes per session: Not on file  . Stress: Not on file  Relationships  . Social connections:    Talks on phone: Not on file    Gets together: Not on file    Attends religious service: Not on file    Active member of club or organization: Not on file    Attends meetings of clubs or organizations: Not on file    Relationship status: Not on file  . Intimate partner violence:    Fear of current or ex partner: Not on file    Emotionally abused: Not on file    Physically abused: Not on file    Forced sexual activity: Not on file  Other Topics Concern  . Not on file  Social History Narrative  . Not on file    FAMILY HISTORY:  Family History  Problem Relation Age of Onset  . Colon cancer Neg Hx     CURRENT MEDICATIONS:  Outpatient Encounter Medications as of 12/27/2018  Medication Sig Note  . acetaminophen ([employer] PAIN RELIEF EXTRA STRENGTH) 500 MG tablet Take 500 mg by mouth every 6  (six) hours as needed (for pain.).   Marland Kitchen atorvastatin (LIPITOR) 20 MG tablet Take 20 mg by mouth every morning.    . Calcium Carb-Cholecalciferol (CALCIUM + D3) 600-200 MG-UNIT TABS Take 1 tablet by mouth 2 (two) times daily.   . carvedilol (COREG) 6.25 MG tablet Take 6.25 mg by mouth 2 (two) times daily with a meal.   . folic acid (FOLVITE) 1 MG tablet Take 1 mg by mouth daily.   . furosemide (LASIX) 40 MG tablet Take 20 mg by mouth as needed.    Marland Kitchen  gabapentin (NEURONTIN) 600 MG tablet Take 600 mg by mouth 2 (two) times daily. MAY TAKE 3RD CAPSULE IF NEEDED   . GLUCOSAMINE-CHONDROITIN PO Take 1 tablet by mouth 2 (two) times daily.   . Insulin Glargine (BASAGLAR KWIKPEN) 100 UNIT/ML SOPN INJECT 42 UNITS IN THE MORNING   . KLOR-CON M20 20 MEQ tablet Take 20 mEq by mouth as needed (When taking Lasix).    Marland Kitchen loperamide (IMODIUM) 2 MG capsule Take 2 mg by mouth daily.   Marland Kitchen losartan (COZAAR) 100 MG tablet Take 25 mg by mouth daily.    . magnesium gluconate (MAGONATE) 30 MG tablet Take 30 mg as needed by mouth.   . methotrexate (RHEUMATREX) 2.5 MG tablet Take 20 mg by mouth every Sunday. Caution:Chemotherapy. Protect from light.    . Milnacipran HCl (SAVELLA) 100 MG TABS tablet Take 100 mg by mouth 2 (two) times daily.   . Multiple Vitamins-Minerals (CENTRUM SILVER PO) Take 1 tablet by mouth daily.   Marland Kitchen NOVOLOG MIX 70/30 FLEXPEN (70-30) 100 UNIT/ML FlexPen INJECT 30 UNITS SUBCUTANEOUSLY TWICE DAILY AT BREAKFAST AND DINNER   . omeprazole (PRILOSEC) 40 MG capsule TAKE ONE CAPSULE BY MOUTH ONCE A DAY   . rivaroxaban (XARELTO) 10 MG TABS tablet TAKE 1 TABLET (20 MG TOTAL) BY MOUTH DAILY WITH SUPPER. 12/27/2018: Taking 10mg  daily at supper  . rOPINIRole (REQUIP) 1 MG tablet Take 1 mg by mouth 2 (two) times daily. MAY TAKE 3RD TABLET AS NEEDED   . Vitamin D, Ergocalciferol, (DRISDOL) 50000 units CAPS capsule Take 50,000 Units by mouth every Wednesday.    Facility-Administered Encounter Medications as of 12/27/2018   Medication  . 0.9 %  sodium chloride infusion    ALLERGIES:  Allergies  Allergen Reactions  . Adhesive [Tape]     Adhesive tape and band-aids cause skin irritation  . Sulfa Antibiotics Nausea Only and Other (See Comments)    Joint paint  . Erythromycin Itching and Rash    burning     PHYSICAL EXAM:  ECOG Performance status: 2  Vitals:   12/27/18 1059  BP: (!) 131/56  Pulse: 87  Resp: 20  Temp: 97.7 F (36.5 C)  SpO2: 100%   Filed Weights   12/27/18 1059  Weight: 254 lb (115.2 kg)    Physical Exam Vitals signs reviewed.  Constitutional:      Appearance: Normal appearance.  Cardiovascular:     Rate and Rhythm: Normal rate and regular rhythm.     Heart sounds: Normal heart sounds.  Pulmonary:     Effort: Pulmonary effort is normal.     Breath sounds: Normal breath sounds.  Abdominal:     General: There is no distension.     Palpations: Abdomen is soft. There is no mass.  Musculoskeletal:        General: No swelling.  Skin:    General: Skin is warm.  Neurological:     General: No focal deficit present.     Mental Status: She is alert.  Psychiatric:        Mood and Affect: Mood normal.        Behavior: Behavior normal.      LABORATORY DATA:  I have reviewed the labs as listed.  CBC    Component Value Date/Time   WBC 8.9 12/20/2018 1108   RBC 3.95 12/20/2018 1108   HGB 12.3 12/20/2018 1108   HCT 39.4 12/20/2018 1108   PLT 231 12/20/2018 1108   MCV 99.7 12/20/2018 1108   MCH  31.1 12/20/2018 1108   MCHC 31.2 12/20/2018 1108   RDW 14.3 12/20/2018 1108   LYMPHSABS 1.6 12/20/2018 1108   MONOABS 0.7 12/20/2018 1108   EOSABS 0.2 12/20/2018 1108   BASOSABS 0.0 12/20/2018 1108   CMP Latest Ref Rng & Units 12/20/2018 09/16/2018 06/09/2018  Glucose 70 - 99 mg/dL 131(H) 249(H) 103(H)  BUN 8 - 23 mg/dL 17 33(H) 24(H)  Creatinine 0.44 - 1.00 mg/dL 1.00 1.00 1.00  Sodium 135 - 145 mmol/L 140 137 142  Potassium 3.5 - 5.1 mmol/L 4.5 3.9 4.8  Chloride 98 -  111 mmol/L 104 100 105  CO2 22 - 32 mmol/L 27 26 30   Calcium 8.9 - 10.3 mg/dL 9.0 9.0 9.5  Total Protein 6.5 - 8.1 g/dL 6.6 7.3 7.0  Total Bilirubin 0.3 - 1.2 mg/dL 0.6 0.9 0.9  Alkaline Phos 38 - 126 U/L 53 56 53  AST 15 - 41 U/L 25 27 27   ALT 0 - 44 U/L 28 33 33       DIAGNOSTIC IMAGING:  I have independently reviewed the scans and discussed with the patient.   I have reviewed Venita Lick LPN's note and agree with the documentation.  I personally performed a face-to-face visit, made revisions and my assessment and plan is as follows.    ASSESSMENT & PLAN:   Malignant neoplasm of colon ([place]) 1.  Stage IIa (CT3CN0) colon cancer: - Right hemicolectomy on 10/10/2016, grade 2 adenocarcinoma: Positive lymphovascular invasion, margins negative, 0 out of 19 lymph nodes positive.  - Because of positive lymphovascular invasion, she was recommended to have Xeloda.  She could not tolerate more than 2 cycles.  - Last PET CT scan on February 22, 2018 was negative for any metastatic disease particularly the liver mass.  There was a 3.1 cm central liver mass on MRI from March 2019 which was not PET avid. -CT abdomen and pelvis dated 09/16/2018 shows caudate lobe of the liver lesion measuring 4.3 cm.  This previously measured 3.1 cm on MRI from 11/02/2017.  Prior PET CT scan was negative.  Given the previous findings on the MRI, this was favored to be benign fat-containing lesion such as atypical focal nodular hyperplasia versus progressive focal fatty infiltration.  Remainder of the exam did not show any evidence of metastatic disease. -She denies any bleeding per rectum.  She has some diarrhea since surgery for which she takes Imodium. -We reviewed blood work.  CEA level is 4.7. -We will see her back in 3 months for follow-up with repeat CT abdomen and pelvis and a CEA level.  2.  Unprovoked PE: -Incidental PE on CT chest in August 2018.  She was treated with Xarelto 20 mg for a year. -Dose was  reduced to 10 mg daily in October 2019.  Minor nosebleeds are noted.  3.  Iron deficiency state: -She received Feraheme on 09/22/2018.  Ferritin is 135 and hemoglobin is 12.3. -She did not report any major improvement in energy after last Feraheme.        Orders placed this encounter:  Orders Placed This Encounter  Procedures  . CT Abdomen Pelvis W Contrast  . CBC with Differential/Platelet  . Comprehensive metabolic panel  . Iron and TIBC  . Ferritin  . CEA      Derek Jack, MD [place] [phone]

## 2018-12-27 NOTE — Assessment & Plan Note (Addendum)
1.  Stage IIa (CT3CN0) colon cancer: - Right hemicolectomy on 10/10/2016, grade 2 adenocarcinoma: Positive lymphovascular invasion, margins negative, 0 out of 19 lymph nodes positive.  - Because of positive lymphovascular invasion, Wanda Curry was recommended to have Xeloda.  Wanda Curry could not tolerate more than 2 cycles.  - Last PET CT scan on February 22, 2018 was negative for any metastatic disease particularly the liver mass.  There was a 3.1 cm central liver mass on MRI from March 2019 which was not PET avid. -CT abdomen and pelvis dated 09/16/2018 shows caudate lobe of the liver lesion measuring 4.3 cm.  This previously measured 3.1 cm on MRI from 11/02/2017.  Prior PET CT scan was negative.  Given the previous findings on the MRI, this was favored to be benign fat-containing lesion such as atypical focal nodular hyperplasia versus progressive focal fatty infiltration.  Remainder of the exam did not show any evidence of metastatic disease. -Wanda Curry denies any bleeding per rectum.  Wanda Curry has some diarrhea since surgery for which Wanda Curry takes Imodium. -We reviewed blood work.  CEA level is 4.7. -We will see her back in 3 months for follow-up with repeat CT abdomen and pelvis and a CEA level.  2.  Unprovoked PE: -Incidental PE on CT chest in August 2018.  Wanda Curry was treated with Xarelto 20 mg for a year. -Dose was reduced to 10 mg daily in October 2019.  Minor nosebleeds are noted.  3.  Iron deficiency state: -Wanda Curry received Feraheme on 09/22/2018.  Ferritin is 135 and hemoglobin is 12.3. -Wanda Curry did not report any major improvement in energy after last Feraheme.

## 2019-03-29 ENCOUNTER — Other Ambulatory Visit: Payer: Self-pay

## 2019-03-29 ENCOUNTER — Ambulatory Visit (HOSPITAL_COMMUNITY)
Admission: RE | Admit: 2019-03-29 | Discharge: 2019-03-29 | Disposition: A | Discharge status: Home / Self Care | Payer: Medicare Other | Source: Ambulatory Visit | Attending: Hematology | Admitting: Hematology

## 2019-03-29 ENCOUNTER — Inpatient Hospital Stay (HOSPITAL_COMMUNITY): Payer: Medicare Other | Attending: Hematology

## 2019-03-29 DIAGNOSIS — Z87891 Personal history of nicotine dependence: Secondary | ICD-10-CM | POA: Insufficient documentation

## 2019-03-29 DIAGNOSIS — Z86711 Personal history of pulmonary embolism: Secondary | ICD-10-CM | POA: Diagnosis not present

## 2019-03-29 DIAGNOSIS — K769 Liver disease, unspecified: Secondary | ICD-10-CM | POA: Diagnosis not present

## 2019-03-29 DIAGNOSIS — R197 Diarrhea, unspecified: Secondary | ICD-10-CM | POA: Insufficient documentation

## 2019-03-29 DIAGNOSIS — C182 Malignant neoplasm of ascending colon: Secondary | ICD-10-CM

## 2019-03-29 DIAGNOSIS — Z7901 Long term (current) use of anticoagulants: Secondary | ICD-10-CM | POA: Insufficient documentation

## 2019-03-29 LAB — CBC WITH DIFFERENTIAL/PLATELET
Abs Immature Granulocytes: 0.05 10*3/uL (ref 0.00–0.07)
Basophils Absolute: 0 10*3/uL (ref 0.0–0.1)
Basophils Relative: 0 %
Eosinophils Absolute: 0.1 10*3/uL (ref 0.0–0.5)
Eosinophils Relative: 1 %
HCT: 40.7 % (ref 36.0–46.0)
Hemoglobin: 12.7 g/dL (ref 12.0–15.0)
Immature Granulocytes: 0 %
Lymphocytes Relative: 19 %
Lymphs Abs: 2.2 10*3/uL (ref 0.7–4.0)
MCH: 30.8 pg (ref 26.0–34.0)
MCHC: 31.2 g/dL (ref 30.0–36.0)
MCV: 98.8 fL (ref 80.0–100.0)
Monocytes Absolute: 0.7 10*3/uL (ref 0.1–1.0)
Monocytes Relative: 6 %
Neutro Abs: 8.5 10*3/uL — ABNORMAL HIGH (ref 1.7–7.7)
Neutrophils Relative %: 74 %
Platelets: 247 10*3/uL (ref 150–400)
RBC: 4.12 MIL/uL (ref 3.87–5.11)
RDW: 14.6 % (ref 11.5–15.5)
WBC: 11.6 10*3/uL — ABNORMAL HIGH (ref 4.0–10.5)
nRBC: 0 % (ref 0.0–0.2)

## 2019-03-29 LAB — COMPREHENSIVE METABOLIC PANEL
ALT: 34 U/L (ref 0–44)
AST: 35 U/L (ref 15–41)
Albumin: 3.6 g/dL (ref 3.5–5.0)
Alkaline Phosphatase: 51 U/L (ref 38–126)
Anion gap: 8 (ref 5–15)
BUN: 21 mg/dL (ref 8–23)
CO2: 28 mmol/L (ref 22–32)
Calcium: 9.1 mg/dL (ref 8.9–10.3)
Chloride: 105 mmol/L (ref 98–111)
Creatinine, Ser: 1.04 mg/dL — ABNORMAL HIGH (ref 0.44–1.00)
GFR calc Af Amer: >60 mL/min (ref >60)
GFR calc non Af Amer: 53 mL/min — ABNORMAL LOW (ref >60)
Glucose, Bld: 96 mg/dL (ref 70–99)
Potassium: 4.6 mmol/L (ref 3.5–5.1)
Sodium: 141 mmol/L (ref 135–145)
Total Bilirubin: 0.7 mg/dL (ref 0.3–1.2)
Total Protein: 6.9 g/dL (ref 6.5–8.1)

## 2019-03-29 LAB — IRON AND TIBC
Iron: 87 ug/dL (ref 28–170)
Saturation Ratios: 27 % (ref 10.4–31.8)
TIBC: 328 ug/dL (ref 250–450)
UIBC: 241 ug/dL

## 2019-03-29 LAB — POCT I-STAT CREATININE: Creatinine, Ser: 1.1 mg/dL — ABNORMAL HIGH (ref 0.44–1.00)

## 2019-03-29 LAB — FERRITIN: Ferritin: 109 ng/mL (ref 11–307)

## 2019-03-29 MED ORDER — IOHEXOL 300 MG/ML  SOLN
100.0000 mL | Freq: Once | INTRAMUSCULAR | Status: AC | PRN
  Administered 2019-03-29: 100 mL via INTRAVENOUS

## 2019-03-30 LAB — CEA: CEA: 4.5 ng/mL (ref 0.0–4.7)

## 2019-03-31 ENCOUNTER — Other Ambulatory Visit: Payer: Self-pay

## 2019-03-31 ENCOUNTER — Telehealth (HOSPITAL_COMMUNITY): Payer: Self-pay | Admitting: *Deleted

## 2019-03-31 ENCOUNTER — Encounter (HOSPITAL_COMMUNITY): Payer: Self-pay | Admitting: Hematology

## 2019-03-31 ENCOUNTER — Inpatient Hospital Stay (HOSPITAL_COMMUNITY): Payer: Medicare Other | Admitting: Hematology

## 2019-03-31 VITALS — BP 148/53 | HR 90 | Temp 97.9°F | Resp 18 | Wt 252.6 lb

## 2019-03-31 DIAGNOSIS — R160 Hepatomegaly, not elsewhere classified: Secondary | ICD-10-CM | POA: Diagnosis not present

## 2019-03-31 DIAGNOSIS — C189 Malignant neoplasm of colon, unspecified: Secondary | ICD-10-CM

## 2019-03-31 DIAGNOSIS — C182 Malignant neoplasm of ascending colon: Secondary | ICD-10-CM | POA: Diagnosis not present

## 2019-03-31 NOTE — Assessment & Plan Note (Signed)
1.  Stage IIa (CT3CN0) colon cancer: -Right hemicolectomy on 10/10/2016, grade 2 adenocarcinoma, positive lymphovascular invasion, margins negative, 0 out of 19 lymph nodes positive.  Because of positive LVI, she was recommended Xeloda.  She did not tolerate more than 2 cycles. - PET scan on 02/23/2018 was negative for any metastatic disease, particularly the liver mass.  This was 3.1 cm central liver mass on MRI from March 2019 which was not PET avid. - We reviewed results of the CTAP on 03/29/2019 which showed mass involving the caudate lobe of liver measures 5.2 cm.  On the previous exam this measured 4.8 cm.  On initial exam on 10/08/2016 this measured 2.9 cm. - CEA on 03/29/2019 was 4.5.  Other blood work was unremarkable. -Based on these findings, I have recommended biopsy of the liver mass. -We have contacted radiology.  They have recommended MRI of the abdomen for better visualization of the margins. - We will arrange for MRI of the liver and schedule a biopsy. -I will see her back after the biopsy to discuss results.  2.  Diarrhea: - She has watery diarrhea up to 1 time per day.  She takes Imodium 1-2 times per day which helps.  She has diarrhea since surgery.  3.  Unprovoked PE: - Incidental PE on CT chest in August 2018.  She was treated with Xarelto 20 mg for a year. - Dose was reduced to 10 mg daily in October 2019.  Patient does not report any bleeding issues. -She was told to hold Xarelto for 5 days prior to biopsy.  4.  Iron deficiency state: - She received Feraheme on 09/22/2018. -Ferritin is 109, percent saturation is 27.  Hemoglobin is 12.7.

## 2019-03-31 NOTE — Patient Instructions (Addendum)
[place] at [facility] Discharge Instructions  You were seen today by Dr. Delton Coombes. He went over your recent lab and scan results. He will schedule you for a liver biopsy in [place].  He will see you back after your biopsy for follow up.   Thank you for choosing [place] at [facility] to provide your oncology and hematology care.  To afford each patient quality time with our provider, please arrive at least 15 minutes before your scheduled appointment time.   If you have a lab appointment with the [facility] please come in thru the  Main Entrance and check in at the main information desk  You need to re-schedule your appointment should you arrive 10 or more minutes late.  We strive to give you quality time with our providers, and arriving late affects you and other patients whose appointments are after yours.  Also, if you no show three or more times for appointments you may be dismissed from the clinic at the providers discretion.     Again, thank you for choosing [facility].  Our hope is that these requests will decrease the amount of time that you wait before being seen by our physicians.       _____________________________________________________________  Should you have questions after your visit to [facility], please contact our office at [phone] between the hours of 8:00 a.m. and 4:30 p.m.  Voicemails left after 4:00 p.m. will not be returned until the following business day.  For prescription refill requests, have your pharmacy contact our office and allow 72 hours.    Cancer Center Support Programs:   > Cancer Support Group  2nd Tuesday of the month 1pm-2pm, Journey Room

## 2019-03-31 NOTE — Telephone Encounter (Signed)
-----   Message from Derek Jack, MD sent at 03/31/2019  5:26 PM EDT ----- Regarding: FW: Biopsy  ----- Message ----- From: Aletta Edouard, MD Sent: 03/31/2019   5:20 PM EDT To: Derek Jack, MD Subject: Biopsy                                         Another very difficult case. The caudate is a very difficult place for [place] to biopsy because of depth. Could we do another MRI for better margin characterization first? If she is in good health, she may be better off talking to a hepatic surgeon about the possibility of resection, but we could talk about that after another MRI and she might be a good case to present at GI conference.  Thanks,  GY

## 2019-03-31 NOTE — Progress Notes (Signed)
[place], [place] [address]   CLINIC:  Medical Oncology/Hematology  PCP:  Earney Mallet, MD [address] [place] [address] [phone]   REASON FOR VISIT:  Follow-up for colon cancer, liver lesion and iron deficiency state.   BRIEF ONCOLOGIC HISTORY:  Oncology History  Colon cancer ([place])  10/10/2016 Initial Diagnosis   Colon cancer ([place])   10/10/2016 Surgery   Partial colectomy by Dr. Arnoldo Morale  Pathology shows  a 2.1 cm grade 2 adenocarcinoma of the ascending colon with invasion through the muscularis propria into the peri-colorectal tissues.      11/17/2016 PET scan   The hepatic lesion seen on the CT scan and MRI does not demonstrate hypermetabolism and is likely a benign entity.  Right maxillary sinus disease with associated hypermetabolism.  Scattered pulmonary nodules, likely benign. No follow-up needed if patient is low-risk (and has no known or suspected primary neoplasm). Non-contrast chest CT can be considered in 12 months if patient is high-risk.    12/25/2016 - 02/04/2017 Adjuvant Chemotherapy   Xeloda 2000mg  PO BID take for 14 days out of 21 days. Plan for total of 24 weeks of treatment.      CANCER STAGING: Cancer Staging Colon cancer [facility]) Staging form: Colon and Rectum, AJCC 8th Edition - Clinical: Stage IIA (cT3, cN0, cM0) - Signed by [name], MD on 11/26/2016    INTERVAL HISTORY:  Wanda Curry 73 y.o. female seen for follow-up of colon cancer and liver lesion.  She reports diarrhea having one episode of watery bowel movement daily.  She had this diarrhea since surgery.  She has been taking 1 to 2 tablets of Imodium daily which is helping control it.  Patient is taking Xarelto 10 mg daily.  She denies any bleeding issues with it.  Denies any bleeding per rectum or melena.  No change in bowel habits noted.  No abdominal pains were reported.    REVIEW OF SYSTEMS:  Review of Systems  Gastrointestinal:  Positive for diarrhea.  All other systems reviewed and are negative.    PAST MEDICAL/SURGICAL HISTORY:  Past Medical History:  Diagnosis Date  . Anemia   . Arthritis    RA  . Cancer ([place])    skin cancer  . Depression   . Diabetes mellitus without complication ([place])   . Fibromyalgia   . GERD (gastroesophageal reflux disease)   . Heart murmur    ECHO scheduled 05-25-2015  . Hyperlipidemia   . Neuropathy   . PONV (postoperative nausea and vomiting)    Past Surgical History:  Procedure Laterality Date  . ABDOMINAL HYSTERECTOMY    . BIOPSY  09/23/2016   Procedure: BIOPSY;  Surgeon: Danie Binder, MD;  [facility];  Service: Endoscopy;;  hepatic flexure mass  . CHOLECYSTECTOMY    . COLONOSCOPY N/A 08/13/2017   Procedure: COLONOSCOPY;  Surgeon: Daneil Dolin, MD;  [facility];  Service: Endoscopy;  Laterality: N/A;  . COLONOSCOPY WITH PROPOFOL N/A 09/23/2016   Procedure: COLONOSCOPY WITH PROPOFOL;  Surgeon: Danie Binder, MD;  [facility];  Service: Endoscopy;  Laterality: N/A;  7:30 am  . HARDWARE REMOVAL Right 05/30/2015   Procedure: HARDWARE REMOVAL;  Surgeon: Ninetta Lights, MD;  Location: [facility];  Service: Orthopedics;  Laterality: Right;  . PARTIAL COLECTOMY N/A 10/10/2016   Procedure: PARTIAL COLECTOMY;  Surgeon: Aviva Signs, MD;  Location: [facility];  Service: General;  Laterality: N/A;  .  planter warts Bilateral    both feet  . POLYPECTOMY  09/23/2016   Procedure: POLYPECTOMY;  Surgeon: Danie Binder, MD;  [facility];  Service: Endoscopy;;  transverse colon polyps times 2, rectal polyp  . skin grafts     due to planter warts  . TOTAL KNEE ARTHROPLASTY Right 05/30/2015   Procedure: RIGHT TOTAL KNEE ARTHROPLASTY;  Surgeon: Ninetta Lights, MD;  Location: [place];  Service: Orthopedics;  Laterality: Right;     SOCIAL HISTORY:  Social History   Socioeconomic History  . Marital status: Widowed    Spouse name: Not on file  .  Number of children: Not on file  . Years of education: Not on file  . Highest education level: Not on file  Occupational History  . Not on file  Social Needs  . Financial resource strain: Not on file  . Food insecurity    Worry: Not on file    Inability: Not on file  . Transportation needs    Medical: Not on file    Non-medical: Not on file  Tobacco Use  . Smoking status: Former Smoker    Packs/day: 1.00    Years: 40.00    Pack years: 40.00    Types: Cigarettes    Quit date: 08/18/2004    Years since quitting: 14.6  . Smokeless tobacco: Never Used  Substance and Sexual Activity  . Alcohol use: No  . Drug use: No  . Sexual activity: Not Currently    Birth control/protection: Surgical  Lifestyle  . Physical activity    Days per week: Not on file    Minutes per session: Not on file  . Stress: Not on file  Relationships  . Social Herbalist on phone: Not on file    Gets together: Not on file    Attends religious service: Not on file    Active member of club or organization: Not on file    Attends meetings of clubs or organizations: Not on file    Relationship status: Not on file  . Intimate partner violence    Fear of current or ex partner: Not on file    Emotionally abused: Not on file    Physically abused: Not on file    Forced sexual activity: Not on file  Other Topics Concern  . Not on file  Social History Narrative  . Not on file    FAMILY HISTORY:  Family History  Problem Relation Age of Onset  . Colon cancer Neg Hx     CURRENT MEDICATIONS:  Outpatient Encounter Medications as of 03/31/2019  Medication Sig Note  . acetaminophen ([employer] PAIN RELIEF EXTRA STRENGTH) 500 MG tablet Take 500 mg by mouth every 6 (six) hours as needed (for pain.).   Marland Kitchen atorvastatin (LIPITOR) 20 MG tablet Take 20 mg by mouth every morning.    . Calcium Carb-Cholecalciferol (CALCIUM + D3) 600-200 MG-UNIT TABS Take 1 tablet by mouth 2 (two) times daily.   . carvedilol (COREG)  6.25 MG tablet Take 6.25 mg by mouth 2 (two) times daily with a meal.   . folic acid (FOLVITE) 1 MG tablet Take 1 mg by mouth daily.   . furosemide (LASIX) 40 MG tablet Take 20 mg by mouth as needed.    . gabapentin (NEURONTIN) 600 MG tablet Take 600 mg by mouth 2 (two) times daily. MAY TAKE 3RD CAPSULE IF NEEDED   . GLUCOSAMINE-CHONDROITIN PO Take 1 tablet by mouth 2 (  two) times daily.   . Insulin Glargine (BASAGLAR KWIKPEN) 100 UNIT/ML SOPN Inject 36 Units into the skin every morning.    Marland Kitchen KLOR-CON M20 20 MEQ tablet Take 20 mEq by mouth as needed (When taking Lasix).    Marland Kitchen loperamide (IMODIUM) 2 MG capsule Take 2 mg by mouth daily.   Marland Kitchen losartan (COZAAR) 100 MG tablet Take 25 mg by mouth daily.    . magnesium gluconate (MAGONATE) 30 MG tablet Take 30 mg as needed by mouth.   . methotrexate (RHEUMATREX) 2.5 MG tablet Take 20 mg by mouth every Sunday. Caution:Chemotherapy. Protect from light.    . Milnacipran HCl (SAVELLA) 100 MG TABS tablet Take 100 mg by mouth 2 (two) times daily.   . Multiple Vitamins-Minerals (CENTRUM SILVER PO) Take 1 tablet by mouth daily.   Marland Kitchen NOVOLOG MIX 70/30 FLEXPEN (70-30) 100 UNIT/ML FlexPen INJECT 30 UNITS SUBCUTANEOUSLY TWICE DAILY AT BREAKFAST AND DINNER   . omeprazole (PRILOSEC) 40 MG capsule TAKE ONE CAPSULE BY MOUTH ONCE A DAY   . rivaroxaban (XARELTO) 10 MG TABS tablet TAKE 1 TABLET (20 MG TOTAL) BY MOUTH DAILY WITH SUPPER. 12/27/2018: Taking 10mg  daily at supper  . rOPINIRole (REQUIP) 1 MG tablet Take 1 mg by mouth 2 (two) times daily. MAY TAKE 3RD TABLET AS NEEDED   . Vitamin D, Ergocalciferol, (DRISDOL) 50000 units CAPS capsule Take 50,000 Units by mouth every Wednesday.    Facility-Administered Encounter Medications as of 03/31/2019  Medication  . 0.9 %  sodium chloride infusion    ALLERGIES:  Allergies  Allergen Reactions  . Adhesive [Tape]     Adhesive tape and band-aids cause skin irritation  . Sulfa Antibiotics Nausea Only and Other (See Comments)     Joint paint  . Erythromycin Itching and Rash    burning     PHYSICAL EXAM:  ECOG Performance status: 2  Vitals:   03/31/19 1507  BP: (!) 148/53  Pulse: 90  Resp: 18  Temp: 97.9 F (36.6 C)  SpO2: 100%   Filed Weights   03/31/19 1507  Weight: 252 lb 9.6 oz (114.6 kg)    Physical Exam Vitals signs reviewed.  Constitutional:      Appearance: Normal appearance.  Cardiovascular:     Rate and Rhythm: Normal rate and regular rhythm.     Heart sounds: Normal heart sounds.  Pulmonary:     Effort: Pulmonary effort is normal.     Breath sounds: Normal breath sounds.  Abdominal:     General: There is no distension.     Palpations: Abdomen is soft. There is no mass.  Musculoskeletal:        General: No swelling.  Skin:    General: Skin is warm.  Neurological:     General: No focal deficit present.     Mental Status: She is alert.  Psychiatric:        Mood and Affect: Mood normal.        Behavior: Behavior normal.      LABORATORY DATA:  I have reviewed the labs as listed.  CBC    Component Value Date/Time   WBC 11.6 (H) 03/29/2019 1409   RBC 4.12 03/29/2019 1409   HGB 12.7 03/29/2019 1409   HCT 40.7 03/29/2019 1409   PLT 247 03/29/2019 1409   MCV 98.8 03/29/2019 1409   MCH 30.8 03/29/2019 1409   MCHC 31.2 03/29/2019 1409   RDW 14.6 03/29/2019 1409   LYMPHSABS 2.2 03/29/2019 1409   MONOABS 0.7  03/29/2019 1409   EOSABS 0.1 03/29/2019 1409   BASOSABS 0.0 03/29/2019 1409   CMP Latest Ref Rng & Units 03/29/2019 03/29/2019 12/20/2018  Glucose 70 - 99 mg/dL 96 - 131(H)  BUN 8 - 23 mg/dL 21 - 17  Creatinine 0.44 - 1.00 mg/dL 1.04(H) 1.10(H) 1.00  Sodium 135 - 145 mmol/L 141 - 140  Potassium 3.5 - 5.1 mmol/L 4.6 - 4.5  Chloride 98 - 111 mmol/L 105 - 104  CO2 22 - 32 mmol/L 28 - 27  Calcium 8.9 - 10.3 mg/dL 9.1 - 9.0  Total Protein 6.5 - 8.1 g/dL 6.9 - 6.6  Total Bilirubin 0.3 - 1.2 mg/dL 0.7 - 0.6  Alkaline Phos 38 - 126 U/L 51 - 53  AST 15 - 41 U/L 35 - 25   ALT 0 - 44 U/L 34 - 28       DIAGNOSTIC IMAGING:  I have independently reviewed the scans and discussed with the patient.   I have reviewed Wanda Lick LPN's note and agree with the documentation.  I personally performed a face-to-face visit, made revisions and my assessment and plan is as follows.    ASSESSMENT & PLAN:   Malignant neoplasm of colon ([place]) 1.  Stage IIa (CT3CN0) colon cancer: -Right hemicolectomy on 10/10/2016, grade 2 adenocarcinoma, positive lymphovascular invasion, margins negative, 0 out of 19 lymph nodes positive.  Because of positive LVI, she was recommended Xeloda.  She did not tolerate more than 2 cycles. - PET scan on 02/23/2018 was negative for any metastatic disease, particularly the liver mass.  This was 3.1 cm central liver mass on MRI from March 2019 which was not PET avid. - We reviewed results of the CTAP on 03/29/2019 which showed mass involving the caudate lobe of liver measures 5.2 cm.  On the previous exam this measured 4.8 cm.  On initial exam on 10/08/2016 this measured 2.9 cm. - CEA on 03/29/2019 was 4.5.  Other blood work was unremarkable. -Based on these findings, I have recommended biopsy of the liver mass. -We have contacted radiology.  They have recommended MRI of the abdomen for better visualization of the margins. - We will arrange for MRI of the liver and schedule a biopsy. -I will see her back after the biopsy to discuss results.  2.  Diarrhea: - She has watery diarrhea up to 1 time per day.  She takes Imodium 1-2 times per day which helps.  She has diarrhea since surgery.  3.  Unprovoked PE: - Incidental PE on CT chest in August 2018.  She was treated with Xarelto 20 mg for a year. - Dose was reduced to 10 mg daily in October 2019.  Patient does not report any bleeding issues. -She was told to hold Xarelto for 5 days prior to biopsy.  4.  Iron deficiency state: - She received Feraheme on 09/22/2018. -Ferritin is 109, percent saturation  is 27.  Hemoglobin is 12.7.   Total time spent is 25 minutes more than 50% of the time spent face-to-face discussing scan results, further work-up, counseling and coordination of care.    Orders placed this encounter:  Orders Placed This Encounter  Procedures  . CT BIOPSY      Derek Jack, [place] [phone]

## 2019-04-07 ENCOUNTER — Ambulatory Visit (HOSPITAL_COMMUNITY)
Admission: RE | Admit: 2019-04-07 | Discharge: 2019-04-07 | Disposition: A | Discharge status: Home / Self Care | Payer: Medicare Other | Source: Ambulatory Visit | Attending: Hematology | Admitting: Hematology

## 2019-04-07 ENCOUNTER — Other Ambulatory Visit: Payer: Self-pay

## 2019-04-07 DIAGNOSIS — R160 Hepatomegaly, not elsewhere classified: Secondary | ICD-10-CM | POA: Diagnosis not present

## 2019-04-07 MED ORDER — GADOBUTROL 1 MMOL/ML IV SOLN
10.0000 mL | Freq: Once | INTRAVENOUS | Status: AC | PRN
  Administered 2019-04-07: 10 mL via INTRAVENOUS

## 2019-04-13 ENCOUNTER — Other Ambulatory Visit (HOSPITAL_COMMUNITY): Payer: Self-pay | Admitting: *Deleted

## 2019-04-13 DIAGNOSIS — R160 Hepatomegaly, not elsewhere classified: Secondary | ICD-10-CM

## 2019-04-13 DIAGNOSIS — C189 Malignant neoplasm of colon, unspecified: Secondary | ICD-10-CM

## 2019-04-13 NOTE — Progress Notes (Signed)
Orders for MRI with EOVISC per GI conference this morning and V/O per Dr. Delton Coombes.

## 2019-04-20 ENCOUNTER — Ambulatory Visit (HOSPITAL_COMMUNITY): Payer: Medicare Other | Admitting: Hematology

## 2019-04-21 ENCOUNTER — Other Ambulatory Visit: Payer: Self-pay

## 2019-04-21 ENCOUNTER — Ambulatory Visit (HOSPITAL_COMMUNITY)
Admission: RE | Admit: 2019-04-21 | Discharge: 2019-04-21 | Disposition: A | Discharge status: Home / Self Care | Payer: Medicare Other | Source: Ambulatory Visit | Attending: Hematology | Admitting: Hematology

## 2019-04-21 DIAGNOSIS — C189 Malignant neoplasm of colon, unspecified: Secondary | ICD-10-CM | POA: Diagnosis present

## 2019-04-21 DIAGNOSIS — R160 Hepatomegaly, not elsewhere classified: Secondary | ICD-10-CM | POA: Insufficient documentation

## 2019-04-21 MED ORDER — GADOBUTROL 1 MMOL/ML IV SOLN: 10.0000 mL | Freq: Once | INTRAVENOUS | Status: DC | PRN

## 2019-04-21 MED ORDER — GADOXETATE DISODIUM 0.25 MMOL/ML IV SOLN
10.0000 mL | Freq: Once | INTRAVENOUS | Status: AC | PRN
  Administered 2019-04-21: 10 mL via INTRAVENOUS

## 2019-04-26 ENCOUNTER — Inpatient Hospital Stay (HOSPITAL_COMMUNITY): Payer: Medicare Other | Attending: Hematology | Admitting: Hematology

## 2019-04-26 ENCOUNTER — Encounter (HOSPITAL_COMMUNITY): Payer: Self-pay | Admitting: Hematology

## 2019-04-26 ENCOUNTER — Inpatient Hospital Stay (HOSPITAL_COMMUNITY): Payer: Medicare Other

## 2019-04-26 ENCOUNTER — Other Ambulatory Visit: Payer: Self-pay

## 2019-04-26 ENCOUNTER — Encounter (HOSPITAL_COMMUNITY): Payer: Self-pay | Admitting: *Deleted

## 2019-04-26 DIAGNOSIS — C182 Malignant neoplasm of ascending colon: Secondary | ICD-10-CM | POA: Insufficient documentation

## 2019-04-26 DIAGNOSIS — C220 Liver cell carcinoma: Secondary | ICD-10-CM | POA: Insufficient documentation

## 2019-04-26 DIAGNOSIS — Z86718 Personal history of other venous thrombosis and embolism: Secondary | ICD-10-CM | POA: Diagnosis not present

## 2019-04-26 DIAGNOSIS — K769 Liver disease, unspecified: Secondary | ICD-10-CM | POA: Diagnosis not present

## 2019-04-26 DIAGNOSIS — E611 Iron deficiency: Secondary | ICD-10-CM | POA: Diagnosis not present

## 2019-04-26 DIAGNOSIS — Z7901 Long term (current) use of anticoagulants: Secondary | ICD-10-CM | POA: Insufficient documentation

## 2019-04-26 DIAGNOSIS — R197 Diarrhea, unspecified: Secondary | ICD-10-CM | POA: Diagnosis not present

## 2019-04-26 DIAGNOSIS — C189 Malignant neoplasm of colon, unspecified: Secondary | ICD-10-CM

## 2019-04-26 NOTE — Assessment & Plan Note (Addendum)
1.  Central liver lesion: -Her case was discussed at GI tumor board, and as the lesion was difficult to biopsy, MRI Eovist was recommended. - MRI of the abdomen with Eovist showed a hypoenhancing lesion in the liver, on the delayed hepatobiliary phase images, which would not be typical for [place].  It was not significantly hypermetabolic on prior PET/CT.  Possibilities include atypical benign lesion, such as atypical hemangioma, or low-grade neoplasm.  Vague accentuation in the lateral segment of the left hepatic lobe without a definitive lesion. -We will check an AFP level today.  We will discuss her case at tumor board next week.  We will also inform Dr.Yanagihara. -We will tentatively schedule her for follow-up MRI with Eovist in 4 months.  2.  Stage IIa (CT3CN0) colon cancer: -Right hemicolectomy on 10/10/2016, grade 2 adenocarcinoma, positive LVSI, margins negative, 0/90 lymph nodes positive. -Because of positive LVSI, she was recommended Xeloda.  She did not tolerate more than 2 cycles. -PET scan on 02/23/2018 was negative for any metastatic disease, particularly liver mass. -CEA on 03/29/2019 was 4.5.  Other blood work was unremarkable. -CT CAP on 03/29/2019 showed mass of liver measuring 5.2 cm, previously 4.8 cm.  On initial CT scan on 10/08/2016 this measured 2.9 cm.  No other evidence of metastatic disease.  3.  Diarrhea: -She has watery diarrhea up to once per day.  She takes Imodium 1-2 tablets/day which helps.  She had this diarrhea since surgery.  3.  Unprovoked PE: -Incidental PE on CT chest in August 2018.  She was treated with Xarelto 20 mg for a year. -Dose was reduced to 10 mg daily in October 2019.  She is tolerating it without any problems.  4.  Iron deficiency state: -Last Feraheme was on 09/22/2018. - Hemoglobin was 12.7, ferritin was 109 and percent saturation was 27 on 03/29/2019.

## 2019-04-26 NOTE — Patient Instructions (Addendum)
[facility] Discharge Instructions  You were seen today by Dr. Katragadda. He went over your recent lab results. He will see you back in 4 months for labs and follow up.   Thank you for choosing [facility] at [facility] to provide your oncology and hematology care.  To afford each patient quality time with our provider, please arrive at least 15 minutes before your scheduled appointment time.   If you have a lab appointment with the Cancer Center please come in thru the  Main Entrance and check in at the main information desk  You need to re-schedule your appointment should you arrive 10 or more minutes late.  We strive to give you quality time with our providers, and arriving late affects you and other patients whose appointments are after yours.  Also, if you no show three or more times for appointments you may be dismissed from the clinic at the providers discretion.     Again, thank you for choosing [facility].  Our hope is that these requests will decrease the amount of time that you wait before being seen by our physicians.       _____________________________________________________________  Should you have questions after your visit to [facility], please contact our office at [phone] between the hours of 8:00 a.m. and 4:30 p.m.  Voicemails left after 4:00 p.m. will not be returned until the following business day.  For prescription refill requests, have your pharmacy contact our office and allow 72 hours.    Cancer Center Support Programs:   > Cancer Support Group  2nd Tuesday of the month 1pm-2pm, Journey Room    

## 2019-04-26 NOTE — Progress Notes (Signed)
Request sent to GI navigator to add Wanda Curry to the tumor board list.  She has been added on 9/16.

## 2019-04-26 NOTE — Progress Notes (Signed)
Wanda Curry, [place] [address]   CLINIC:  Medical Oncology/Hematology  PCP:  Earney Mallet, MD [address] [place] [address] [phone]   REASON FOR VISIT:  Follow-up for colon cancer, liver lesion and iron deficiency state.   BRIEF ONCOLOGIC HISTORY:  Oncology History  Colon cancer ([place])  10/10/2016 Initial Diagnosis   Colon cancer ([place])   10/10/2016 Surgery   Partial colectomy by Dr. Arnoldo Morale  Pathology shows  a 2.1 cm grade 2 adenocarcinoma of the ascending colon with invasion through the muscularis propria into the peri-colorectal tissues.      11/17/2016 PET scan   The hepatic lesion seen on the CT scan and MRI does not demonstrate hypermetabolism and is likely a benign entity.  Right maxillary sinus disease with associated hypermetabolism.  Scattered pulmonary nodules, likely benign. No follow-up needed if patient is low-risk (and has no known or suspected primary neoplasm). Non-contrast chest CT can be considered in 12 months if patient is high-risk.    12/25/2016 - 02/04/2017 Adjuvant Chemotherapy   Xeloda 2000mg  PO BID take for 14 days out of 21 days. Plan for total of 24 weeks of treatment.      CANCER STAGING: Cancer Staging Colon cancer [facility]) Staging form: Colon and Rectum, AJCC 8th Edition - Clinical: Stage IIA (cT3, cN0, cM0) - Signed by [name], MD on 11/26/2016    INTERVAL HISTORY:  Wanda Curry 73 y.o. female seen for follow-up of colon cancer and liver lesion.  She underwent MRI with Eovist last week.  She reported stable leg swelling and abdominal pain.  Diarrhea is also more or less stable.  Appetite is 100%.  Energy levels are low.  Upper epigastric pain rated as 3 out of 10 is also stable.  Denies any fevers, night sweats or weight loss.    REVIEW OF SYSTEMS:  Review of Systems  Cardiovascular: Positive for leg swelling.  Gastrointestinal: Positive for abdominal pain and diarrhea.  All other  systems reviewed and are negative.    PAST MEDICAL/SURGICAL HISTORY:  Past Medical History:  Diagnosis Date  . Anemia   . Arthritis    RA  . Cancer ([place])    skin cancer  . Depression   . Diabetes mellitus without complication ([place])   . Fibromyalgia   . GERD (gastroesophageal reflux disease)   . Heart murmur    ECHO scheduled 05-25-2015  . Hyperlipidemia   . Neuropathy   . PONV (postoperative nausea and vomiting)    Past Surgical History:  Procedure Laterality Date  . ABDOMINAL HYSTERECTOMY    . BIOPSY  09/23/2016   Procedure: BIOPSY;  Surgeon: Danie Binder, MD;  [facility];  Service: Endoscopy;;  hepatic flexure mass  . CHOLECYSTECTOMY    . COLONOSCOPY N/A 08/13/2017   Procedure: COLONOSCOPY;  Surgeon: Daneil Dolin, MD;  [facility];  Service: Endoscopy;  Laterality: N/A;  . COLONOSCOPY WITH PROPOFOL N/A 09/23/2016   Procedure: COLONOSCOPY WITH PROPOFOL;  Surgeon: Danie Binder, MD;  [facility];  Service: Endoscopy;  Laterality: N/A;  7:30 am  . HARDWARE REMOVAL Right 05/30/2015   Procedure: HARDWARE REMOVAL;  Surgeon: Ninetta Lights, MD;  Location: [facility];  Service: Orthopedics;  Laterality: Right;  . PARTIAL COLECTOMY N/A 10/10/2016   Procedure: PARTIAL COLECTOMY;  Surgeon: Aviva Signs, MD;  Location: [facility];  Service: General;  Laterality: N/A;  . planter warts Bilateral    both feet  .  POLYPECTOMY  09/23/2016   Procedure: POLYPECTOMY;  Surgeon: Danie Binder, MD;  [facility];  Service: Endoscopy;;  transverse colon polyps times 2, rectal polyp  . skin grafts     due to planter warts  . TOTAL KNEE ARTHROPLASTY Right 05/30/2015   Procedure: RIGHT TOTAL KNEE ARTHROPLASTY;  Surgeon: Ninetta Lights, MD;  Location: [facility];  Service: Orthopedics;  Laterality: Right;     SOCIAL HISTORY:  Social History   Socioeconomic History  . Marital status: Widowed    Spouse name: Not on file  . Number of children: Not on file  .  Years of education: Not on file  . Highest education level: Not on file  Occupational History  . Not on file  Social Needs  . Financial resource strain: Not on file  . Food insecurity    Worry: Not on file    Inability: Not on file  . Transportation needs    Medical: Not on file    Non-medical: Not on file  Tobacco Use  . Smoking status: Former Smoker    Packs/day: 1.00    Years: 40.00    Pack years: 40.00    Types: Cigarettes    Quit date: 08/18/2004    Years since quitting: 14.6  . Smokeless tobacco: Never Used  Substance and Sexual Activity  . Alcohol use: No  . Drug use: No  . Sexual activity: Not Currently    Birth control/protection: Surgical  Lifestyle  . Physical activity    Days per week: Not on file    Minutes per session: Not on file  . Stress: Not on file  Relationships  . Social Herbalist on phone: Not on file    Gets together: Not on file    Attends religious service: Not on file    Active member of club or organization: Not on file    Attends meetings of clubs or organizations: Not on file    Relationship status: Not on file  . Intimate partner violence    Fear of current or ex partner: Not on file    Emotionally abused: Not on file    Physically abused: Not on file    Forced sexual activity: Not on file  Other Topics Concern  . Not on file  Social History Narrative  . Not on file    FAMILY HISTORY:  Family History  Problem Relation Age of Onset  . Colon cancer Neg Hx     CURRENT MEDICATIONS:  Outpatient Encounter Medications as of 04/26/2019  Medication Sig Note  . acetaminophen ([employer] PAIN RELIEF EXTRA STRENGTH) 500 MG tablet Take 500 mg by mouth every 6 (six) hours as needed (for pain.).   Marland Kitchen atorvastatin (LIPITOR) 20 MG tablet Take 20 mg by mouth every morning.    . Calcium Carb-Cholecalciferol (CALCIUM + D3) 600-200 MG-UNIT TABS Take 1 tablet by mouth 2 (two) times daily.   . carvedilol (COREG) 6.25 MG tablet Take 6.25 mg by mouth  2 (two) times daily with a meal.   . folic acid (FOLVITE) 1 MG tablet Take 1 mg by mouth daily.   . furosemide (LASIX) 40 MG tablet Take 20 mg by mouth as needed.    . gabapentin (NEURONTIN) 600 MG tablet Take 600 mg by mouth 2 (two) times daily. MAY TAKE 3RD CAPSULE IF NEEDED   . GLUCOSAMINE-CHONDROITIN PO Take 1 tablet by mouth 2 (two) times daily.   . Insulin Glargine [facility])  100 UNIT/ML SOPN Inject 36 Units into the skin every morning.    Marland Kitchen KLOR-CON M20 20 MEQ tablet Take 20 mEq by mouth as needed (When taking Lasix).    Marland Kitchen loperamide (IMODIUM) 2 MG capsule Take 2 mg by mouth daily.   Marland Kitchen losartan (COZAAR) 25 MG tablet Take 25 mg by mouth daily.   . magnesium gluconate (MAGONATE) 30 MG tablet Take 30 mg as needed by mouth.   . methotrexate (RHEUMATREX) 2.5 MG tablet Take 20 mg by mouth every Sunday. Caution:Chemotherapy. Protect from light.    . Milnacipran HCl (SAVELLA) 100 MG TABS tablet Take 100 mg by mouth 2 (two) times daily.   . Multiple Vitamins-Minerals (CENTRUM SILVER PO) Take 1 tablet by mouth daily.   Marland Kitchen NOVOLOG MIX 70/30 FLEXPEN (70-30) 100 UNIT/ML FlexPen INJECT 30 UNITS SUBCUTANEOUSLY TWICE DAILY AT BREAKFAST AND DINNER   . omeprazole (PRILOSEC) 40 MG capsule TAKE ONE CAPSULE BY MOUTH ONCE A DAY   . rivaroxaban (XARELTO) 10 MG TABS tablet TAKE 1 TABLET (20 MG TOTAL) BY MOUTH DAILY WITH SUPPER. 12/27/2018: Taking 10mg  daily at supper  . rOPINIRole (REQUIP) 1 MG tablet Take 1 mg by mouth 2 (two) times daily. MAY TAKE 3RD TABLET AS NEEDED   . Vitamin D, Ergocalciferol, (DRISDOL) 50000 units CAPS capsule Take 50,000 Units by mouth every Wednesday.   . [DISCONTINUED] losartan (COZAAR) 100 MG tablet Take 25 mg by mouth daily.     Facility-Administered Encounter Medications as of 04/26/2019  Medication  . 0.9 %  sodium chloride infusion    ALLERGIES:  Allergies  Allergen Reactions  . Adhesive [Tape]     Adhesive tape and band-aids cause skin irritation  . Sulfa  Antibiotics Nausea Only and Other (See Comments)    Joint paint  . Erythromycin Itching and Rash    burning     PHYSICAL EXAM:  ECOG Performance status: 2  Vitals:   04/26/19 1200  BP: (!) 129/58  Pulse: 79  Resp: 16  Temp: 97.9 F (36.6 C)  SpO2: 100%   Filed Weights   04/26/19 1200  Weight: 255 lb 5 oz (115.8 kg)    Physical Exam Vitals signs reviewed.  Constitutional:      Appearance: Normal appearance.  Cardiovascular:     Rate and Rhythm: Normal rate and regular rhythm.     Heart sounds: Normal heart sounds.  Pulmonary:     Effort: Pulmonary effort is normal.     Breath sounds: Normal breath sounds.  Abdominal:     General: There is no distension.     Palpations: Abdomen is soft. There is no mass.  Musculoskeletal:        General: No swelling.  Skin:    General: Skin is warm.  Neurological:     General: No focal deficit present.     Mental Status: She is alert.  Psychiatric:        Mood and Affect: Mood normal.        Behavior: Behavior normal.      LABORATORY DATA:  I have reviewed the labs as listed.  CBC    Component Value Date/Time   WBC 11.6 (H) 03/29/2019 1409   RBC 4.12 03/29/2019 1409   HGB 12.7 03/29/2019 1409   HCT 40.7 03/29/2019 1409   PLT 247 03/29/2019 1409   MCV 98.8 03/29/2019 1409   MCH 30.8 03/29/2019 1409   MCHC 31.2 03/29/2019 1409   RDW 14.6 03/29/2019 1409   LYMPHSABS 2.2 03/29/2019  1409   MONOABS 0.7 03/29/2019 1409   EOSABS 0.1 03/29/2019 1409   BASOSABS 0.0 03/29/2019 1409   CMP Latest Ref Rng & Units 03/29/2019 03/29/2019 12/20/2018  Glucose 70 - 99 mg/dL 96 - 131(H)  BUN 8 - 23 mg/dL 21 - 17  Creatinine 0.44 - 1.00 mg/dL 1.04(H) 1.10(H) 1.00  Sodium 135 - 145 mmol/L 141 - 140  Potassium 3.5 - 5.1 mmol/L 4.6 - 4.5  Chloride 98 - 111 mmol/L 105 - 104  CO2 22 - 32 mmol/L 28 - 27  Calcium 8.9 - 10.3 mg/dL 9.1 - 9.0  Total Protein 6.5 - 8.1 g/dL 6.9 - 6.6  Total Bilirubin 0.3 - 1.2 mg/dL 0.7 - 0.6  Alkaline Phos  38 - 126 U/L 51 - 53  AST 15 - 41 U/L 35 - 25  ALT 0 - 44 U/L 34 - 28       DIAGNOSTIC IMAGING:  I have independently reviewed the scans and discussed with the patient.   I have reviewed Venita Lick LPN's note and agree with the documentation.  I personally performed a face-to-face visit, made revisions and my assessment and plan is as follows.    ASSESSMENT & PLAN:   Malignant neoplasm of colon ([place]) 1.  Central liver lesion: -Her case was discussed at GI tumor board, and as the lesion was difficult to biopsy, MRI Eovist was recommended. - MRI of the abdomen with Eovist showed a hypoenhancing lesion in the liver, on the delayed hepatobiliary phase images, which would not be typical for [place].  It was not significantly hypermetabolic on prior PET/CT.  Possibilities include atypical benign lesion, such as atypical hemangioma, or low-grade neoplasm.  Vague accentuation in the lateral segment of the left hepatic lobe without a definitive lesion. -We will check an AFP level today.  We will discuss her case at tumor board next week.  We will also inform Dr.Yanagihara. -We will tentatively schedule her for follow-up MRI with Eovist in 4 months.  2.  Stage IIa (CT3CN0) colon cancer: -Right hemicolectomy on 10/10/2016, grade 2 adenocarcinoma, positive LVSI, margins negative, 0/90 lymph nodes positive. -Because of positive LVSI, she was recommended Xeloda.  She did not tolerate more than 2 cycles. -PET scan on 02/23/2018 was negative for any metastatic disease, particularly liver mass. -CEA on 03/29/2019 was 4.5.  Other blood work was unremarkable. -CT CAP on 03/29/2019 showed mass of liver measuring 5.2 cm, previously 4.8 cm.  On initial CT scan on 10/08/2016 this measured 2.9 cm.  No other evidence of metastatic disease.  3.  Diarrhea: -She has watery diarrhea up to once per day.  She takes Imodium 1-2 tablets/day which helps.  She had this diarrhea since surgery.  3.  Unprovoked PE:  -Incidental PE on CT chest in August 2018.  She was treated with Xarelto 20 mg for a year. -Dose was reduced to 10 mg daily in October 2019.  She is tolerating it without any problems.  4.  Iron deficiency state: -Last Feraheme was on 09/22/2018. - Hemoglobin was 12.7, ferritin was 109 and percent saturation was 27 on 03/29/2019.  Total time spent is 25 minutes more than 50% of the time spent face-to-face discussing scan results, further work-up, counseling and coordination of care.    Orders placed this encounter:  Orders Placed This Encounter  Procedures  . MR Abdomen W Wo Contrast  . AFP tumor marker  . Hepatic function panel      Derek Jack, MD [facility]  [place] [phone]

## 2019-04-27 LAB — AFP TUMOR MARKER: AFP, Serum, Tumor Marker: 3.9 ng/mL (ref 0.0–8.3)

## 2019-04-29 ENCOUNTER — Other Ambulatory Visit (HOSPITAL_COMMUNITY): Payer: Self-pay | Admitting: *Deleted

## 2019-04-29 DIAGNOSIS — K769 Liver disease, unspecified: Secondary | ICD-10-CM

## 2019-05-10 ENCOUNTER — Other Ambulatory Visit: Payer: Self-pay | Admitting: Radiology

## 2019-05-11 ENCOUNTER — Encounter (HOSPITAL_COMMUNITY): Payer: Self-pay

## 2019-05-11 ENCOUNTER — Other Ambulatory Visit: Payer: Self-pay

## 2019-05-11 ENCOUNTER — Ambulatory Visit (HOSPITAL_COMMUNITY)
Admission: RE | Admit: 2019-05-11 | Discharge: 2019-05-11 | Disposition: A | Discharge status: Home / Self Care | Payer: Medicare Other | Source: Ambulatory Visit | Attending: Hematology | Admitting: Hematology

## 2019-05-11 DIAGNOSIS — Z7901 Long term (current) use of anticoagulants: Secondary | ICD-10-CM | POA: Insufficient documentation

## 2019-05-11 DIAGNOSIS — Z87891 Personal history of nicotine dependence: Secondary | ICD-10-CM | POA: Diagnosis not present

## 2019-05-11 DIAGNOSIS — D649 Anemia, unspecified: Secondary | ICD-10-CM | POA: Diagnosis not present

## 2019-05-11 DIAGNOSIS — M069 Rheumatoid arthritis, unspecified: Secondary | ICD-10-CM | POA: Diagnosis not present

## 2019-05-11 DIAGNOSIS — E119 Type 2 diabetes mellitus without complications: Secondary | ICD-10-CM | POA: Insufficient documentation

## 2019-05-11 DIAGNOSIS — M797 Fibromyalgia: Secondary | ICD-10-CM | POA: Insufficient documentation

## 2019-05-11 DIAGNOSIS — R011 Cardiac murmur, unspecified: Secondary | ICD-10-CM | POA: Insufficient documentation

## 2019-05-11 DIAGNOSIS — K219 Gastro-esophageal reflux disease without esophagitis: Secondary | ICD-10-CM | POA: Diagnosis not present

## 2019-05-11 DIAGNOSIS — Z79899 Other long term (current) drug therapy: Secondary | ICD-10-CM | POA: Insufficient documentation

## 2019-05-11 DIAGNOSIS — E785 Hyperlipidemia, unspecified: Secondary | ICD-10-CM | POA: Insufficient documentation

## 2019-05-11 DIAGNOSIS — K769 Liver disease, unspecified: Secondary | ICD-10-CM

## 2019-05-11 DIAGNOSIS — Z794 Long term (current) use of insulin: Secondary | ICD-10-CM | POA: Insufficient documentation

## 2019-05-11 DIAGNOSIS — C220 Liver cell carcinoma: Secondary | ICD-10-CM | POA: Insufficient documentation

## 2019-05-11 DIAGNOSIS — Z85038 Personal history of other malignant neoplasm of large intestine: Secondary | ICD-10-CM | POA: Insufficient documentation

## 2019-05-11 LAB — GLUCOSE, CAPILLARY
Glucose-Capillary: 57 mg/dL — ABNORMAL LOW (ref 70–99)
Glucose-Capillary: 85 mg/dL (ref 70–99)
Glucose-Capillary: 86 mg/dL (ref 70–99)
Glucose-Capillary: 105 mg/dL — ABNORMAL HIGH (ref 70–99)

## 2019-05-11 LAB — CBC
HCT: 40.7 % (ref 36.0–46.0)
Hemoglobin: 13.4 g/dL (ref 12.0–15.0)
MCH: 32.4 pg (ref 26.0–34.0)
MCHC: 32.9 g/dL (ref 30.0–36.0)
MCV: 98.3 fL (ref 80.0–100.0)
Platelets: 255 10*3/uL (ref 150–400)
RBC: 4.14 MIL/uL (ref 3.87–5.11)
RDW: 14.5 % (ref 11.5–15.5)
WBC: 11.4 10*3/uL — ABNORMAL HIGH (ref 4.0–10.5)
nRBC: 0 % (ref 0.0–0.2)

## 2019-05-11 LAB — PROTIME-INR
INR: 1.1 (ref 0.8–1.2)
Prothrombin Time: 13.9 seconds (ref 11.4–15.2)

## 2019-05-11 MED ORDER — ONDANSETRON HCL 4 MG/2ML IJ SOLN
INTRAMUSCULAR | Status: AC | PRN
  Administered 2019-05-11: 4 mg via INTRAVENOUS

## 2019-05-11 MED ORDER — FENTANYL CITRATE (PF) 100 MCG/2ML IJ SOLN
INTRAMUSCULAR | Status: AC
  Filled 2019-05-11: qty 2

## 2019-05-11 MED ORDER — GELATIN ABSORBABLE 12-7 MM EX MISC
CUTANEOUS | Status: AC
  Filled 2019-05-11: qty 1

## 2019-05-11 MED ORDER — FENTANYL CITRATE (PF) 100 MCG/2ML IJ SOLN
INTRAMUSCULAR | Status: AC | PRN
  Administered 2019-05-11: 50 ug via INTRAVENOUS

## 2019-05-11 MED ORDER — MIDAZOLAM HCL 2 MG/2ML IJ SOLN
INTRAMUSCULAR | Status: AC | PRN
  Administered 2019-05-11: 1 mg via INTRAVENOUS

## 2019-05-11 MED ORDER — DEXTROSE-NACL 5-0.9 % IV SOLN: INTRAVENOUS | Status: DC

## 2019-05-11 MED ORDER — DEXTROSE 50 % IV SOLN
INTRAVENOUS | Status: AC
  Filled 2019-05-11: qty 50

## 2019-05-11 MED ORDER — DEXTROSE 50 % IV SOLN
INTRAVENOUS | Status: AC | PRN
  Administered 2019-05-11: 12.5 g via INTRAVENOUS

## 2019-05-11 MED ORDER — DEXTROSE 50 % IV SOLN
25.0000 mL | Freq: Once | INTRAVENOUS | Status: AC
  Administered 2019-05-11: 11:00:00 50 mL via INTRAVENOUS

## 2019-05-11 MED ORDER — LIDOCAINE-EPINEPHRINE 1 %-1:100000 IJ SOLN
INTRAMUSCULAR | Status: AC
  Filled 2019-05-11: qty 1

## 2019-05-11 MED ORDER — SODIUM CHLORIDE 0.9 % IV SOLN: INTRAVENOUS | Status: DC

## 2019-05-11 MED ORDER — MIDAZOLAM HCL 2 MG/2ML IJ SOLN
INTRAMUSCULAR | Status: AC
  Filled 2019-05-11: qty 2

## 2019-05-11 NOTE — Procedures (Signed)
Pre Procedure Dx: Liver mass Post Procedural Dx: Same  Technically successful US guided biopsy of indeterminate mass within the central aspect of the liver.  EBL: None No immediate complications.   Ronny Bacon, MD Pager #: [phone]

## 2019-05-11 NOTE — Discharge Instructions (Addendum)
Liver Biopsy ° °The liver is a large organ in the upper right side of the abdomen. A liver biopsy is a procedure in which a tissue sample is taken from the liver and examined under a microscope. °There are three types of liver biopsies: °· Percutaneous. A needle is used to remove a sample through an incision in your abdomen. °· Laparoscopic. Several incisions are made in the abdomen. A sample is removed with the help of a tiny camera. °· Transjugular. An incision is made in your neck in the area of the jugular vein. A sample is removed through a small flexible tube that is passed down the blood vessel and into your liver. °Tell a health care provider about: °· Any allergies you have. °· All medicines you are taking, including vitamins, herbs, eye drops, creams, and over-the-counter medicines. °· Any problems you or family members have had with anesthetic medicines. °· Any blood disorders you have. °· Any surgeries you have had. °· Any medical conditions you have. °· Whether you are pregnant or may be pregnant. °What are the risks? °Generally, this is a safe procedure. However, problems can occur and include: °· Bleeding. °· Infection. °· Bruising. °· Pain. °· Injury to nearby organs or tissues, such as nerves, gallbladder, liver, or lungs. °What happens before the procedure? °Eating and drinking restrictions °· You may be asked not to drink or eat for 6-8 hours before the liver biopsy. You may be allowed to eat a light breakfast. Talk to your health care provider about when you should stop eating and drinking. °Medicines °Ask your health care provider about: °· Changing or stopping your regular medicines. This is especially important if you are taking diabetes medicines or blood thinners. °· Taking medicines such as aspirin and ibuprofen. These medicines can thin your blood. Do not take these medicines unless your health care provider tells you to take them. °· Taking over-the-counter medicines, vitamins, herbs, and  supplements. °General instructions °· Do not use any products that contain nicotine or tobacco, such as cigarettes and e-cigarettes. If you need help quitting, ask your health care provider. °· Plan to have someone take you home from the hospital or clinic. °· Plan to have a responsible adult care for you for at least 24 hours after you leave the hospital or clinic. This is important. °· You may have blood or urine tests. °· Ask your health care provider what steps will be taken to prevent infection. These may include: °? Removing hair at the surgery site. °? Washing skin with a germ-killing soap. °? Taking antibiotic medicine. °What happens during the procedure? °· An IV will be inserted into one of your veins. °? You will be given one or more of the following: °? A medicine to help you relax (sedative). °? A medicine to numb the area (local anesthetic). °? A medicine to make you fall asleep (general anesthetic). °· Your health care provider will use one of the following procedures to remove samples from your liver. These procedures may vary among health care providers and hospitals. °Percutaneous liver biopsy °· You will lie on your back, with your right hand over your head. °· A health care provider will locate your liver by tapping and pressing on the right side of your abdomen, or by using an ultrasound or CT scan. °· A local anesthetic will be used to numb an area at the bottom of your last right rib. °· A small incision will be made in the numbed area. °·   A biopsy needle will be inserted into the incision. °· Several samples of liver tissue will be taken. You will be asked to hold your breath as each sample is taken. °· The incision will be closed with stitches (sutures). °· A bandage (dressing) may be placed over the incision. °Laparoscopic liver biopsy °· You will lie on your back. °· Several small incisions will be made in your abdomen. °· Your health care provider will pass a tiny camera through one  incision. The camera will allow the liver to be viewed on a TV monitor in the operating room. °· Tools will be passed through the other incision or incisions. °· Samples of the liver will be removed using the tools. °· The incisions will be closed with stitches (sutures). °· A bandage (dressing) may be placed over the incisions. °Transjugular liver biopsy °· You will lie on your back on an X-ray table, with your head turned to your left. °· An area on your neck, just over your jugular vein, will be numbed. °· An incision will be made in the numbed area. °· A tiny tube will be inserted through the incision. The tube will be passed into the jugular vein to a blood vessel in the liver called the hepatic vein. °· A dye will be injected through the tube. °· X-rays will be taken. The dye will make the blood vessels in the liver light up on the X-rays. °· The biopsy needle will be placed through the tube until it reaches the liver. °· Samples of liver tissue will be taken with the biopsy needle. °· The needle and the tube will be removed. °· The incision will be closed with stitches (sutures). °· A bandage (dressing) may be placed over the incision. °What happens after the procedure? °· Your blood pressure, heart rate, breathing rate, and blood oxygen level will be monitored until you leave the hospital or clinic. °· You will be asked to rest quietly for 2-4 hours or longer. °· You will be closely monitored for bleeding from the biopsy site. °· You may be allowed to go home when the medicines have worn off and you can walk, drink, eat, and use the bathroom. °Summary °· A liver biopsy is a procedure in which a tissue sample is taken from the liver and examined under a microscope. °· This is a safe procedure, but problems can occur, including bleeding, infection, pain, or injury to nearby organs or tissues. °· Ask your health care provider about changing or stopping your regular medicines. °· Plan to have someone take you  home from the hospital or clinic and to be with you for 24 hours after the procedure. °This information is not intended to replace advice given to you by your health care provider. Make sure you discuss any questions you have with your health care provider. °Document Released: 10/25/2003 Document Revised: 08/14/2017 Document Reviewed: 08/14/2017 °Elsevier Patient Education © 2020 [employer] °Moderate Conscious Sedation, Adult, Care After °These instructions provide you with information about caring for yourself after your procedure. Your health care provider may also give you more specific instructions. Your treatment has been planned according to current medical practices, but problems sometimes occur. Call your health care provider if you have any problems or questions after your procedure. °What can I expect after the procedure? °After your procedure, it is common: °· To feel sleepy for several hours. °· To feel clumsy and have poor balance for several hours. °· To have poor judgment for   several hours. °· To vomit if you eat too soon. °Follow these instructions at home: °For at least 24 hours after the procedure: ° °· Do not: °? Participate in activities where you could fall or become injured. °? Drive. °? Use heavy machinery. °? Drink alcohol. °? Take sleeping pills or medicines that cause drowsiness. °? Make important decisions or sign legal documents. °? Take care of children on your own. °· Rest. °Eating and drinking °· Follow the diet recommended by your health care provider. °· If you vomit: °? Drink water, juice, or soup when you can drink without vomiting. °? Make sure you have little or no nausea before eating solid foods. °General instructions °· Have a responsible adult stay with you until you are awake and alert. °· Take over-the-counter and prescription medicines only as told by your health care provider. °· If you smoke, do not smoke without supervision. °· Keep all follow-up visits as told by  your health care provider. This is important. °Contact a health care provider if: °· You keep feeling nauseous or you keep vomiting. °· You feel light-headed. °· You develop a rash. °· You have a fever. °Get help right away if: °· You have trouble breathing. °This information is not intended to replace advice given to you by your health care provider. Make sure you discuss any questions you have with your health care provider. °Document Released: 05/25/2013 Document Revised: 07/17/2017 Document Reviewed: 11/24/2015 °Elsevier Patient Education © 2020 [employer] ° °

## 2019-05-11 NOTE — H&P (Signed)
Chief Complaint: Patient was seen in consultation today for liver lesion biopsy.  Referring Physician(s): Firefighter  Supervising Physician: Sandi Mariscal  Patient Status: [facility] - Out-pt  History of Present Illness: Wanda Curry is a 73 y.o. female with a past medical history significant for depression, fibromyalgia, rheumatoid arthritis, GERD, DM, neuropathy, HLD and colon cancer s/p right hemicolectomy (2018) followed by Dr. Delton Coombes who presents today for a liver lesion biopsy. Wanda Curry reports that she initially began oral chemotherapy after her colon resection in 2018 however she could not tolerate the medication and has been undergoing routine image monitoring only. She underwent CT abd/pelvis w/contrast on 03/29/19 which showed a continued gradual increase in the previously known central liver lesion involving the caudate lobe, now measuring 5.2 cm (previously 2.9 cm on 10/08/16). She then underwent MRI of the abdomen with and w/o contrast on 04/07/19 which noted slow growth of the relatively well-circumscribed central liver lesion over prior exams including MRI dated 11/02/17, a biopsy was recommended for further evaluation. She was referred to IR for possible biopsy - after review of the imaging an MRI Eovist was recommendation as well as presentation of her case at tumor board. MRI abdomen Eovist was performed on 04/21/19 which showed a slowly enlarging hepatic lesion with indeterminate imaging characteristics, Eovist contrast medium in the lesion was hypoenhancing to the liver on the delayed hepatobiliary phase images which would not be typical of focal nodular hyperplasia. Her case was reviewed at GI conference and decision was made to proceed with a liver lesion biopsy.   Wanda Curry reports that she has been doing well overall, denies any complaints. She took her 70/30 insulin this morning and had low blood sugar when she arrived to short stay which was corrected with 1/2 amp  of dextrose. She reports that when her blood sugar gets low she gets "profusely sweaty." She states that she was told she may have to have surgery on her liver because of this nodule. She states understanding of the requested procedure and wishes to proceed.   Past Medical History:  Diagnosis Date   Anemia    Arthritis    RA   Cancer ([place])    skin cancer   Depression    Diabetes mellitus without complication (HCC)    Fibromyalgia    GERD (gastroesophageal reflux disease)    Heart murmur    ECHO scheduled 05-25-2015   Hyperlipidemia    Neuropathy    PONV (postoperative nausea and vomiting)     Past Surgical History:  Procedure Laterality Date   ABDOMINAL HYSTERECTOMY     BIOPSY  09/23/2016   Procedure: BIOPSY;  Surgeon: Danie Binder, MD;  Location: [facility];  Service: Endoscopy;;  hepatic flexure mass   CHOLECYSTECTOMY     COLONOSCOPY N/A 08/13/2017   Procedure: COLONOSCOPY;  Surgeon: Daneil Dolin, MD;  Location: [facility];  Service: Endoscopy;  Laterality: N/A;   COLONOSCOPY WITH PROPOFOL N/A 09/23/2016   Procedure: COLONOSCOPY WITH PROPOFOL;  Surgeon: Danie Binder, MD;  Location: [facility];  Service: Endoscopy;  Laterality: N/A;  7:30 am   HARDWARE REMOVAL Right 05/30/2015   Procedure: HARDWARE REMOVAL;  Surgeon: Ninetta Lights, MD;  Location: [facility];  Service: Orthopedics;  Laterality: Right;   PARTIAL COLECTOMY N/A 10/10/2016   Procedure: PARTIAL COLECTOMY;  Surgeon: Aviva Signs, MD;  Location: [facility];  Service: General;  Laterality: N/A;   planter warts Bilateral    both feet  POLYPECTOMY  09/23/2016   Procedure: POLYPECTOMY;  Surgeon: Danie Binder, MD;  Location: [facility];  Service: Endoscopy;;  transverse colon polyps times 2, rectal polyp   skin grafts     due to planter warts   TOTAL KNEE ARTHROPLASTY Right 05/30/2015   Procedure: RIGHT TOTAL KNEE ARTHROPLASTY;  Surgeon: Ninetta Lights, MD;  Location: [facility];  Service:  Orthopedics;  Laterality: Right;    Allergies: Adhesive [tape], Sulfa antibiotics, and Erythromycin  Medications: Prior to Admission medications   Medication Sig Start Date End Date Taking? Authorizing Provider  acetaminophen ([employer] PAIN RELIEF EXTRA STRENGTH) 500 MG tablet Take 500 mg by mouth every 6 (six) hours as needed (for pain.).   Yes [provider]  atorvastatin (LIPITOR) 20 MG tablet Take 20 mg by mouth every morning.    Yes [provider]  Calcium Carb-Cholecalciferol (CALCIUM + D3) 600-200 MG-UNIT TABS Take 1 tablet by mouth 2 (two) times daily. 07/16/16  Yes [provider]  carvedilol (COREG) 6.25 MG tablet Take 6.25 mg by mouth 2 (two) times daily with a meal.   Yes [provider]  folic acid (FOLVITE) 1 MG tablet Take 1 mg by mouth daily.   Yes [provider]  furosemide (LASIX) 20 MG tablet Take 20 mg by mouth daily as needed for edema.    Yes [provider]  gabapentin (NEURONTIN) 600 MG tablet Take 600 mg by mouth 2 (two) times daily. MAY TAKE 3RD CAPSULE IF NEEDED   Yes [provider]  GLUCOSAMINE-CHONDROITIN PO Take 1 tablet by mouth 2 (two) times daily.   Yes [provider]  Insulin Glargine (BASAGLAR KWIKPEN) 100 UNIT/ML SOPN Inject 36 Units into the skin every morning.  12/23/17  Yes [provider]  KLOR-CON M20 20 MEQ tablet Take 20 mEq by mouth daily as needed (When taking Lasix).  07/28/17  Yes [provider]  loperamide (IMODIUM) 2 MG capsule Take 2 mg by mouth daily.   Yes [provider]  losartan (COZAAR) 25 MG tablet Take 25 mg by mouth daily. 04/20/19  Yes [provider]  Magnesium Gluconate 250 MG TABS Take 250 mg by mouth daily as needed (cramps).    Yes [provider]  methotrexate (RHEUMATREX) 2.5 MG tablet Take 20 mg by mouth every Sunday. Caution:Chemotherapy. Protect from light.    Yes [provider]  Milnacipran HCl  (SAVELLA) 100 MG TABS tablet Take 100 mg by mouth 2 (two) times daily.   Yes [provider]  Multiple Vitamins-Minerals (CENTRUM SILVER PO) Take 1 tablet by mouth daily.   Yes [provider]  NOVOLOG MIX 70/30 FLEXPEN (70-30) 100 UNIT/ML FlexPen Inject 30 Units into the skin 2 (two) times daily with a meal.  09/14/16  Yes [provider]  omeprazole (PRILOSEC) 40 MG capsule Take 40 mg by mouth daily.  11/17/16  Yes [provider]  rivaroxaban (XARELTO) 10 MG TABS tablet TAKE 1 TABLET (20 MG TOTAL) BY MOUTH DAILY WITH SUPPER. Patient taking differently: Take 10 mg by mouth daily. TAKE 1 TABLET (10 MG TOTAL) BY MOUTH DAILY WITH SUPPER. 06/16/18  Yes Lockamy, Randi L, NP-C  rOPINIRole (REQUIP) 1 MG tablet Take 1 mg by mouth every morning. MAY TAKE 2 ADDITIONAL DOSES  AS NEEDED   Yes [provider]  Vitamin D, Ergocalciferol, (DRISDOL) 50000 units CAPS capsule Take 50,000 Units by mouth every Wednesday.   Yes [provider]  Family History  Problem Relation Age of Onset   Colon cancer Neg Hx     Social History   Socioeconomic History   Marital status: Widowed    Spouse name: Not on file   Number of children: Not on file   Years of education: Not on file   Highest education level: Not on file  Occupational History   Not on file  Social Needs   Financial resource strain: Not on file   Food insecurity    Worry: Not on file    Inability: Not on file   Transportation needs    Medical: Not on file    Non-medical: Not on file  Tobacco Use   Smoking status: Former Smoker    Packs/day: 1.00    Years: 40.00    Pack years: 40.00    Types: Cigarettes    Quit date: 08/18/2004    Years since quitting: 14.7   Smokeless tobacco: Never Used  Substance and Sexual Activity   Alcohol use: No   Drug use: No   Sexual activity: Not Currently    Birth control/protection: Surgical  Lifestyle   Physical activity    Days per  week: Not on file    Minutes per session: Not on file   Stress: Not on file  Relationships   Social connections    Talks on phone: Not on file    Gets together: Not on file    Attends religious service: Not on file    Active member of club or organization: Not on file    Attends meetings of clubs or organizations: Not on file    Relationship status: Not on file  Other Topics Concern   Not on file  Social History Narrative   Not on file     Review of Systems: A 12 point ROS discussed and pertinent positives are indicated in the HPI above.  All other systems are negative.  Review of Systems  Constitutional: Negative for chills and fever.  HENT: Negative for nosebleeds.   Respiratory: Negative for cough and shortness of breath.   Cardiovascular: Negative for chest pain.  Gastrointestinal: Negative for abdominal pain, blood in stool, diarrhea, nausea and vomiting.  Genitourinary: Negative for hematuria.  Musculoskeletal: Negative for back pain.  Skin: Negative for rash and wound.  Neurological: Negative for dizziness, syncope and headaches.    Vital Signs: BP (!) 141/70    Pulse 80    Temp (!) 97.3 F (36.3 C)    Resp 16    Ht 5\' 5"  (1.651 m)    Wt 250 lb (113.4 kg)    SpO2 99%    BMI 41.60 kg/m   Physical Exam Vitals signs reviewed.  Constitutional:      General: She is not in acute distress. HENT:     Head: Normocephalic.     Mouth/Throat:     Mouth: Mucous membranes are moist.     Pharynx: Oropharynx is clear. No oropharyngeal exudate or posterior oropharyngeal erythema.  Cardiovascular:     Rate and Rhythm: Normal rate and regular rhythm.  Pulmonary:     Effort: Pulmonary effort is normal.     Breath sounds: Normal breath sounds.  Abdominal:     General: Bowel sounds are normal. There is no distension.     Palpations: Abdomen is soft.     Tenderness: There is no abdominal tenderness.  Skin:    General: Skin is warm and dry.  Neurological:     Mental  Status: She is alert and oriented to person, place, and time.  Psychiatric:        Mood and Affect: Mood normal.        Behavior: Behavior normal.        Thought Content: Thought content normal.        Judgment: Judgment normal.      MD Evaluation Airway: WNL(Top and bottom full dentures) Heart: WNL Abdomen: WNL Chest/ Lungs: WNL ASA  Classification: 3 Mallampati/Airway Score: One   Imaging: Mr Abdomen W Wo Contrast  Result Date: 04/21/2019 CLINICAL DATA:  Liver lesion on CT, personal history of colon cancer. Prior chemotherapy. EXAM: MRI ABDOMEN WITHOUT AND WITH CONTRAST TECHNIQUE: Multiplanar multisequence MR imaging of the abdomen was performed both before and after the administration of intravenous contrast. CONTRAST:  60mL EOVIST GADOXETATE DISODIUM 0.25 MOL/L IV SOLN COMPARISON:  Multiple exams, including 04/07/2019 MRI FINDINGS: Despite efforts by the technologist and patient, motion artifact is present on today's exam and could not be eliminated. This reduces exam sensitivity and specificity. Lower chest: Unremarkable Hepatobiliary: The central liver lesion measures 4.9 by 3.6 cm on image 17/16, stable from 04/07/2019 but increased in size compared to 10/28/2016 where this measured about 3.0 by 2.4 cm. This lesion has T2 hyperintensity, intermediate to low T1 signal precontrast, and demonstrates diffuse arterial phase enhancement but mildly hypoenhancing compared to the liver on portal venous phase images. On the hepatobiliary phase late images, the lesion is hypoenhancing relative to the liver. This lesion is mild diffusion restriction. In the lateral segment left hepatic lobe on image 30/9 and on image 32/11 there is a question of enhancement posteriorly. No underlying lesion is seen in this vicinity on any of the other sequences and is possible this could represent pulsation artifact from the aorta. Pancreas:  Unremarkable Spleen:  Unremarkable Adrenals/Urinary Tract:  Unremarkable  Stomach/Bowel: Unremarkable Vascular/Lymphatic:  Aortoiliac atherosclerotic vascular disease. Other:  No supplemental non-categorized findings. Musculoskeletal: Unremarkable IMPRESSION: 1. Slowly enlarging central hepatic lesion with indeterminate imaging characteristics. Today we used Eovist contrast medium in the lesion was hypoenhancing to the liver on the delayed hepatobiliary phase images, which would not be typical for focal nodular hyperplasia. On the other hand the lesion was not significantly hypermetabolic on prior PET-CT. Possibilities may include atypical benign lesion such as atypical hemangioma, or low-grade neoplasm. Options include continued surveillance or biopsy. 2. Vague accentuation of signal posteriorly in the lateral segment left hepatic lobe on early postcontrast images is observed but without a definite lesion in this vicinity on other sequences, this may reflect pulsation artifact from the aorta rather than a true lesion. 3.  Aortic Atherosclerosis (ICD10-I70.0). Electronically Signed   By: Van Clines M.D.   On: 04/21/2019 11:50    Labs:  CBC: Recent Labs    09/16/18 0914 12/20/18 1108 03/29/19 1409 05/11/19 1103  WBC 14.2* 8.9 11.6* 11.4*  HGB 13.8 12.3 12.7 13.4  HCT 44.1 39.4 40.7 40.7  PLT 258 231 247 255    COAGS: Recent Labs    05/11/19 1103  INR 1.1    BMP: Recent Labs    06/09/18 1202 09/16/18 0914 12/20/18 1108 03/29/19 1344 03/29/19 1409  NA 142 137 140  --  141  K 4.8 3.9 4.5  --  4.6  CL 105 100 104  --  105  CO2 30 26 27   --  28  GLUCOSE 103* 249* 131*  --  96  BUN 24* 33* 17  --  21  CALCIUM 9.5 9.0 9.0  --  9.1  CREATININE 1.00 1.00 1.00 1.10* 1.04*  GFRNONAA 54* 55* 55*  --  53*  GFRAA >60 >60 >60  --  >60    LIVER FUNCTION TESTS: Recent Labs    06/09/18 1202 09/16/18 0914 12/20/18 1108 03/29/19 1409  BILITOT 0.9 0.9 0.6 0.7  AST 27 27 25  35  ALT 33 33 28 34  ALKPHOS 53 56 53 51  PROT 7.0 7.3 6.6 6.9  ALBUMIN  3.6 3.8 3.4* 3.6    TUMOR MARKERS: No results for input(s): AFPTM, CEA, CA199, CHROMGRNA in the last 8760 hours.  Assessment and Plan:  73 y/o F with history of colon cancer s/p hemicolectomy (2018) followed with routine imaging only found to have enlarging liver lesion concerning for neoplasm. Request made to IR for biopsy of this lesion which was approved by Dr. Earleen Newport to be performed by Dr. Pascal Lux.  Patient CBG 57 upon arrival which improved to 86 after 1/2 amp D50 with recheck 30 mins later stable at 85. She has been NPO since 8 pm last night, last dose of Xarelto 9/21. Afebrile, WBC 11.4, hgb 13.4, plt 255, INR 1.1.  Risks and benefits of liver lesion biopsy was discussed with the patient and/or patient's family including, but not limited to bleeding, infection, damage to adjacent structures or low yield requiring additional tests.  All of the questions were answered and there is agreement to proceed.  Consent signed and in chart.   Thank you for this interesting consult.  I greatly enjoyed meeting Wanda Curry and look forward to participating in their care.  A copy of this report was sent to the requesting provider on this date.  Electronically Signed: Joaquim Nam, PA-C 05/11/2019, 12:03 PM   I spent a total of  30 Minutes  in face to face in clinical consultation, greater than 50% of which was counseling/coordinating care for liver lesion biopsy.

## 2019-05-11 NOTE — Progress Notes (Signed)
PA paged from short stay due to patient with hypoglycemia after taking her morning insulin.  Give 1/2 amp D50 now.  Then start D5 1/2 NS @ 50 mL/hr.  Monitor CBGs.  Brynda Greathouse, MS RD PA-C

## 2019-05-12 LAB — SURGICAL PATHOLOGY

## 2019-05-17 ENCOUNTER — Encounter (HOSPITAL_COMMUNITY): Payer: Self-pay | Admitting: Hematology

## 2019-05-17 ENCOUNTER — Inpatient Hospital Stay (HOSPITAL_COMMUNITY): Payer: Medicare Other

## 2019-05-17 ENCOUNTER — Other Ambulatory Visit: Payer: Self-pay

## 2019-05-17 ENCOUNTER — Inpatient Hospital Stay (HOSPITAL_BASED_OUTPATIENT_CLINIC_OR_DEPARTMENT_OTHER): Payer: Medicare Other | Admitting: Hematology

## 2019-05-17 VITALS — BP 127/51 | HR 88 | Temp 97.8°F | Resp 16 | Wt 251.9 lb

## 2019-05-17 DIAGNOSIS — C220 Liver cell carcinoma: Secondary | ICD-10-CM | POA: Diagnosis not present

## 2019-05-17 DIAGNOSIS — C182 Malignant neoplasm of ascending colon: Secondary | ICD-10-CM | POA: Diagnosis not present

## 2019-05-17 LAB — HEPATITIS B SURFACE ANTIGEN: Hepatitis B Surface Ag: NONREACTIVE

## 2019-05-17 LAB — HEPATITIS B CORE ANTIBODY, TOTAL: Hep B Core Total Ab: NONREACTIVE

## 2019-05-17 LAB — HEPATITIS B SURFACE ANTIBODY,QUALITATIVE: Hep B S Ab: NONREACTIVE

## 2019-05-17 NOTE — Progress Notes (Signed)
Wanda Curry, [place] [address]   CLINIC:  Medical Oncology/Hematology  PCP:  Earney Mallet, MD [address] [place] [address] [phone]   REASON FOR VISIT:  Follow-up for colon cancer, liver lesion and iron deficiency state.   BRIEF ONCOLOGIC HISTORY:  Oncology History  Colon cancer ([place])  10/10/2016 Initial Diagnosis   Colon cancer ([place])   10/10/2016 Surgery   Partial colectomy by Dr. Arnoldo Morale  Pathology shows  a 2.1 cm grade 2 adenocarcinoma of the ascending colon with invasion through the muscularis propria into the peri-colorectal tissues.      11/17/2016 PET scan   The hepatic lesion seen on the CT scan and MRI does not demonstrate hypermetabolism and is likely a benign entity.  Right maxillary sinus disease with associated hypermetabolism.  Scattered pulmonary nodules, likely benign. No follow-up needed if patient is low-risk (and has no known or suspected primary neoplasm). Non-contrast chest CT can be considered in 12 months if patient is high-risk.    12/25/2016 - 02/04/2017 Adjuvant Chemotherapy   Xeloda 2000mg  PO BID take for 14 days out of 21 days. Plan for total of 24 weeks of treatment.      CANCER STAGING: Cancer Staging Colon cancer [facility]) Staging form: Colon and Rectum, AJCC 8th Edition - Clinical: Stage IIA (cT3, cN0, cM0) - Signed by [name], MD on 11/26/2016    INTERVAL HISTORY:  Wanda Curry 73 y.o. female seen for follow-up of liver lesion biopsy.  She had biopsy done on 05/11/2019.  She does report some soreness in the area.  No bleeding reported.  Appetite is 100%.  Energy levels are low.  Ankle swellings is stable.  Diarrhea is also stable but she takes Imodium pills.  Denies any fevers or chills.   REVIEW OF SYSTEMS:  Review of Systems  Constitutional: Positive for fatigue.  Cardiovascular: Positive for leg swelling.  Gastrointestinal: Positive for diarrhea.  All other systems reviewed and  are negative.    PAST MEDICAL/SURGICAL HISTORY:  Past Medical History:  Diagnosis Date  . Anemia   . Arthritis    RA  . Cancer ([place])    skin cancer  . Depression   . Diabetes mellitus without complication ([place])   . Fibromyalgia   . GERD (gastroesophageal reflux disease)   . Heart murmur    ECHO scheduled 05-25-2015  . Hyperlipidemia   . Neuropathy   . PONV (postoperative nausea and vomiting)    Past Surgical History:  Procedure Laterality Date  . ABDOMINAL HYSTERECTOMY    . BIOPSY  09/23/2016   Procedure: BIOPSY;  Surgeon: Danie Binder, MD;  [facility];  Service: Endoscopy;;  hepatic flexure mass  . CHOLECYSTECTOMY    . COLONOSCOPY N/A 08/13/2017   Procedure: COLONOSCOPY;  Surgeon: Daneil Dolin, MD;  [facility];  Service: Endoscopy;  Laterality: N/A;  . COLONOSCOPY WITH PROPOFOL N/A 09/23/2016   Procedure: COLONOSCOPY WITH PROPOFOL;  Surgeon: Danie Binder, MD;  [facility];  Service: Endoscopy;  Laterality: N/A;  7:30 am  . HARDWARE REMOVAL Right 05/30/2015   Procedure: HARDWARE REMOVAL;  Surgeon: Ninetta Lights, MD;  Location: [facility];  Service: Orthopedics;  Laterality: Right;  . PARTIAL COLECTOMY N/A 10/10/2016   Procedure: PARTIAL COLECTOMY;  Surgeon: Aviva Signs, MD;  Location: [facility];  Service: General;  Laterality: N/A;  . planter warts Bilateral    both feet  . POLYPECTOMY  09/23/2016  Procedure: POLYPECTOMY;  Surgeon: Danie Binder, MD;  [facility];  Service: Endoscopy;;  transverse colon polyps times 2, rectal polyp  . skin grafts     due to planter warts  . TOTAL KNEE ARTHROPLASTY Right 05/30/2015   Procedure: RIGHT TOTAL KNEE ARTHROPLASTY;  Surgeon: Ninetta Lights, MD;  Location: [facility];  Service: Orthopedics;  Laterality: Right;     SOCIAL HISTORY:  Social History   Socioeconomic History  . Marital status: Widowed    Spouse name: Not on file  . Number of children: Not on file  . Years of education:  Not on file  . Highest education level: Not on file  Occupational History  . Not on file  Social Needs  . Financial resource strain: Not on file  . Food insecurity    Worry: Not on file    Inability: Not on file  . Transportation needs    Medical: Not on file    Non-medical: Not on file  Tobacco Use  . Smoking status: Former Smoker    Packs/day: 1.00    Years: 40.00    Pack years: 40.00    Types: Cigarettes    Quit date: 08/18/2004    Years since quitting: 14.7  . Smokeless tobacco: Never Used  Substance and Sexual Activity  . Alcohol use: No  . Drug use: No  . Sexual activity: Not Currently    Birth control/protection: Surgical  Lifestyle  . Physical activity    Days per week: Not on file    Minutes per session: Not on file  . Stress: Not on file  Relationships  . Social Herbalist on phone: Not on file    Gets together: Not on file    Attends religious service: Not on file    Active member of club or organization: Not on file    Attends meetings of clubs or organizations: Not on file    Relationship status: Not on file  . Intimate partner violence    Fear of current or ex partner: Not on file    Emotionally abused: Not on file    Physically abused: Not on file    Forced sexual activity: Not on file  Other Topics Concern  . Not on file  Social History Narrative  . Not on file    FAMILY HISTORY:  Family History  Problem Relation Age of Onset  . Colon cancer Neg Hx     CURRENT MEDICATIONS:  Outpatient Encounter Medications as of 05/17/2019  Medication Sig Note  . carvedilol (COREG) 6.25 MG tablet Take 6.25 mg by mouth 2 (two) times daily with a meal.   . folic acid (FOLVITE) 1 MG tablet Take 1 mg by mouth daily.   Marland Kitchen gabapentin (NEURONTIN) 600 MG tablet Take 600 mg by mouth 2 (two) times daily. MAY TAKE 3RD CAPSULE IF NEEDED   . Insulin Glargine (BASAGLAR KWIKPEN) 100 UNIT/ML SOPN Inject 36 Units into the skin every morning.    Marland Kitchen KLOR-CON M20 20 MEQ  tablet Take 20 mEq by mouth daily.    Marland Kitchen loperamide (IMODIUM) 2 MG capsule Take 2 mg by mouth daily.   Marland Kitchen losartan (COZAAR) 25 MG tablet Take 25 mg by mouth daily.   . methotrexate (RHEUMATREX) 2.5 MG tablet Take 20 mg by mouth every Sunday. Caution:Chemotherapy. Protect from light.    . Milnacipran HCl (SAVELLA) 100 MG TABS tablet Take 100 mg by mouth 2 (two) times daily.   Marland Kitchen  Misc Natural Products (OSTEO BI-FLEX ADV JOINT SHIELD PO) Take by mouth 2 (two) times daily.   . Multiple Vitamins-Minerals (CENTRUM SILVER PO) Take 1 tablet by mouth daily.   Marland Kitchen NOVOLOG MIX 70/30 FLEXPEN (70-30) 100 UNIT/ML FlexPen Inject 30 Units into the skin 2 (two) times daily with a meal.    . omeprazole (PRILOSEC) 40 MG capsule Take 40 mg by mouth daily.    . rivaroxaban (XARELTO) 10 MG TABS tablet TAKE 1 TABLET (20 MG TOTAL) BY MOUTH DAILY WITH SUPPER. (Patient taking differently: Take 10 mg by mouth daily. TAKE 1 TABLET (10 MG TOTAL) BY MOUTH DAILY WITH SUPPER.) 05/11/2019: Last dose was 05/08/19  . rOPINIRole (REQUIP) 1 MG tablet Take 1 mg by mouth every morning. MAY TAKE 2 ADDITIONAL DOSES  AS NEEDED   . acetaminophen ([employer] PAIN RELIEF EXTRA STRENGTH) 500 MG tablet Take 500 mg by mouth every 6 (six) hours as needed (for pain.).   Marland Kitchen atorvastatin (LIPITOR) 20 MG tablet Take 20 mg by mouth every morning.  05/09/2019: On hold   . Calcium Carb-Cholecalciferol (CALCIUM + D3) 600-200 MG-UNIT TABS Take 1 tablet by mouth 2 (two) times daily. 05/09/2019: ON HOLD   . furosemide (LASIX) 20 MG tablet Take 20 mg by mouth daily as needed for edema.    . Magnesium Gluconate 250 MG TABS Take 250 mg by mouth daily as needed (cramps).    . Vitamin D, Ergocalciferol, (DRISDOL) 50000 units CAPS capsule Take 50,000 Units by mouth every Wednesday. 05/09/2019: On hold   . [DISCONTINUED] GLUCOSAMINE-CHONDROITIN PO Take 1 tablet by mouth 2 (two) times daily.    Facility-Administered Encounter Medications as of 05/17/2019  Medication  . 0.9 %   sodium chloride infusion    ALLERGIES:  Allergies  Allergen Reactions  . Adhesive [Tape]     Adhesive tape and band-aids cause skin irritation  . Sulfa Antibiotics Nausea Only and Other (See Comments)    Joint paint  . Erythromycin Itching and Rash    burning     PHYSICAL EXAM:  ECOG Performance status: 2  Vitals:   05/17/19 1101  BP: (!) 127/51  Pulse: 88  Resp: 16  Temp: 97.8 F (36.6 C)  SpO2: 98%   Filed Weights   05/17/19 1101  Weight: 251 lb 14.4 oz (114.3 kg)    Physical Exam Vitals signs reviewed.  Constitutional:      Appearance: Normal appearance.  Cardiovascular:     Rate and Rhythm: Normal rate and regular rhythm.     Heart sounds: Normal heart sounds.  Pulmonary:     Effort: Pulmonary effort is normal.     Breath sounds: Normal breath sounds.  Abdominal:     General: There is no distension.     Palpations: Abdomen is soft. There is no mass.  Musculoskeletal:        General: No swelling.  Skin:    General: Skin is warm.  Neurological:     General: No focal deficit present.     Mental Status: She is alert.  Psychiatric:        Mood and Affect: Mood normal.        Behavior: Behavior normal.      LABORATORY DATA:  I have reviewed the labs as listed.  CBC    Component Value Date/Time   WBC 11.4 (H) 05/11/2019 1103   RBC 4.14 05/11/2019 1103   HGB 13.4 05/11/2019 1103   HCT 40.7 05/11/2019 1103   PLT 255 05/11/2019  1103   MCV 98.3 05/11/2019 1103   MCH 32.4 05/11/2019 1103   MCHC 32.9 05/11/2019 1103   RDW 14.5 05/11/2019 1103   LYMPHSABS 2.2 03/29/2019 1409   MONOABS 0.7 03/29/2019 1409   EOSABS 0.1 03/29/2019 1409   BASOSABS 0.0 03/29/2019 1409   CMP Latest Ref Rng & Units 03/29/2019 03/29/2019 12/20/2018  Glucose 70 - 99 mg/dL 96 - 131(H)  BUN 8 - 23 mg/dL 21 - 17  Creatinine 0.44 - 1.00 mg/dL 1.04(H) 1.10(H) 1.00  Sodium 135 - 145 mmol/L 141 - 140  Potassium 3.5 - 5.1 mmol/L 4.6 - 4.5  Chloride 98 - 111 mmol/L 105 - 104  CO2  22 - 32 mmol/L 28 - 27  Calcium 8.9 - 10.3 mg/dL 9.1 - 9.0  Total Protein 6.5 - 8.1 g/dL 6.9 - 6.6  Total Bilirubin 0.3 - 1.2 mg/dL 0.7 - 0.6  Alkaline Phos 38 - 126 U/L 51 - 53  AST 15 - 41 U/L 35 - 25  ALT 0 - 44 U/L 34 - 28       DIAGNOSTIC IMAGING:  I have independently reviewed the scans and discussed with the patient.   I have reviewed Venita Lick LPN's note and agree with the documentation.  I personally performed a face-to-face visit, made revisions and my assessment and plan is as follows.    ASSESSMENT & PLAN:   Hepatocellular carcinoma ([place]) 1.  Hepatocellular carcinoma: -MRI of the abdomen with Eovist on 04/21/2019 showed hypoenhancing lesion in the liver, which was not typical for [facility].  It was not significantly hypermetabolic on prior PET/CT.  This lesion has been gradually increasing since 10/08/2016. - AFP was normal at 3.9. -Biopsy on 05/11/2019 shows well to moderately differentiated hepatocellular carcinoma, steatohepatitic variant. - I had prolonged discussion with the patient about pathology report.  I have recommended hepatitis panel.  We will also do CT scan of the chest to complete staging work-up. - I have reached out to Dr. Kathlene Cote for locoregional therapy in the form of ablation/Y 90 TACE. - I will see her back after the scans.  2.  Stage IIa (CT3CN0) colon cancer: -Right hemicolectomy on 10/10/2016, grade 2 adenocarcinoma, positive LVSI, margins negative, 0/19 lymph nodes positive. -Because of positive LVSI, she was recommended Xarelto.  She could not tolerate more than 2 cycles. -PET scan on 02/23/2018- for metastatic disease.  Liver mass was also not seen. -CEA on 03/29/2019 was 4.5. -CT AP on 03/29/2019 showed mass of the liver measuring 5.2 cm, previously 4.8 cm.  3.  Diarrhea: -She has watery diarrhea up to once per day.  She takes Imodium 1-2 tablets/day which helps.  She had this diarrhea since surgery.  3.  Unprovoked PE: -Incidental PE on CT  chest in August 2018.  She was treated with Xarelto 20 mg for a year. -Dose was reduced to 10 mg daily in October 2019.  She is tolerating it without any problems.  4.  Iron deficiency state: -Last Feraheme was on 09/22/2018. - Hemoglobin was 13 on 05/11/2019.  Total time spent is 40 minutes with more than 50% of the time spent face-to-face discussing new diagnosis, management options, counseling and coordination of care.    Orders placed this encounter:  Orders Placed This Encounter  Procedures  . CT Chest W Contrast  . Hepatitis B surface antigen  . Hepatitis B surface antibody,qualitative  . Hepatitis B core antibody, total  . HCV RNA quant rflx ultra or genotyp  Derek Jack, MD [place] [phone]

## 2019-05-17 NOTE — Assessment & Plan Note (Addendum)
1.  Hepatocellular carcinoma: -MRI of the abdomen with Eovist on 04/21/2019 showed hypoenhancing lesion in the liver, which was not typical for [facility].  It was not significantly hypermetabolic on prior PET/CT.  This lesion has been gradually increasing since 10/08/2016. - AFP was normal at 3.9. -Biopsy on 05/11/2019 shows well to moderately differentiated hepatocellular carcinoma, steatohepatitic variant. - I had prolonged discussion with the patient about pathology report.  I have recommended hepatitis panel.  We will also do CT scan of the chest to complete staging work-up. - I have reached out to Dr. Kathlene Cote for locoregional therapy in the form of ablation/Y 90 TACE. - I will see her back after the scans.  2.  Stage IIa (CT3CN0) colon cancer: -Right hemicolectomy on 10/10/2016, grade 2 adenocarcinoma, positive LVSI, margins negative, 0/19 lymph nodes positive. -Because of positive LVSI, she was recommended Xarelto.  She could not tolerate more than 2 cycles. -PET scan on 02/23/2018- for metastatic disease.  Liver mass was also not seen. -CEA on 03/29/2019 was 4.5. -CT [facility] on 03/29/2019 showed mass of the liver measuring 5.2 cm, previously 4.8 cm.  3.  Diarrhea: -She has watery diarrhea up to once per day.  She takes Imodium 1-2 tablets/day which helps.  She had this diarrhea since surgery.  3.  Unprovoked PE: -Incidental PE on CT chest in August 2018.  She was treated with Xarelto 20 mg for a year. -Dose was reduced to 10 mg daily in October 2019.  She is tolerating it without any problems.  4.  Iron deficiency state: -Last Feraheme was on 09/22/2018. - Hemoglobin was 13 on 05/11/2019.

## 2019-05-17 NOTE — Patient Instructions (Signed)
[place] at [facility] Discharge Instructions  You were seen today by Dr. Delton Coombes. He went over your recent biopsy results. The results show that you have hepatocellular / liver cancer. You will need more blood work as well as a CT scan of your chest. He will send you back to the radiologist that did your biopsy. He will see you back after scan for follow up.   Thank you for choosing [place] at [facility] to provide your oncology and hematology care.  To afford each patient quality time with our provider, please arrive at least 15 minutes before your scheduled appointment time.   If you have a lab appointment with the [facility] please come in thru the  Main Entrance and check in at the main information desk  You need to re-schedule your appointment should you arrive 10 or more minutes late.  We strive to give you quality time with our providers, and arriving late affects you and other patients whose appointments are after yours.  Also, if you no show three or more times for appointments you may be dismissed from the clinic at the providers discretion.     Again, thank you for choosing [facility].  Our hope is that these requests will decrease the amount of time that you wait before being seen by our physicians.       _____________________________________________________________  Should you have questions after your visit to [facility], please contact our office at [phone] between the hours of 8:00 a.m. and 4:30 p.m.  Voicemails left after 4:00 p.m. will not be returned until the following business day.  For prescription refill requests, have your pharmacy contact our office and allow 72 hours.    Cancer Center Support Programs:   > Cancer Support Group  2nd Tuesday of the month 1pm-2pm, Journey Room

## 2019-05-18 LAB — HCV RNA QUANT RFLX ULTRA OR GENOTYP
HCV RNA Qnt(log copy/mL): UNDETERMINED log10 IU/mL
HepC Qn: NOT DETECTED IU/mL

## 2019-05-31 ENCOUNTER — Ambulatory Visit (HOSPITAL_COMMUNITY)
Admission: RE | Admit: 2019-05-31 | Discharge: 2019-05-31 | Disposition: A | Discharge status: Home / Self Care | Payer: Medicare Other | Source: Ambulatory Visit | Attending: Hematology | Admitting: Hematology

## 2019-05-31 ENCOUNTER — Other Ambulatory Visit: Payer: Self-pay

## 2019-05-31 DIAGNOSIS — C220 Liver cell carcinoma: Secondary | ICD-10-CM | POA: Insufficient documentation

## 2019-05-31 LAB — POCT I-STAT CREATININE: Creatinine, Ser: 1.5 mg/dL — ABNORMAL HIGH (ref 0.44–1.00)

## 2019-05-31 MED ORDER — IOHEXOL 300 MG/ML  SOLN
75.0000 mL | Freq: Once | INTRAMUSCULAR | Status: AC | PRN
  Administered 2019-05-31: 17:00:00 60 mL via INTRAVENOUS

## 2019-06-01 ENCOUNTER — Other Ambulatory Visit: Payer: Self-pay

## 2019-06-02 ENCOUNTER — Encounter (HOSPITAL_COMMUNITY): Payer: Self-pay | Admitting: Hematology

## 2019-06-02 ENCOUNTER — Inpatient Hospital Stay (HOSPITAL_COMMUNITY): Payer: Medicare Other | Attending: Hematology | Admitting: Hematology

## 2019-06-02 VITALS — BP 144/57 | HR 94 | Temp 97.3°F | Resp 16 | Wt 254.2 lb

## 2019-06-02 DIAGNOSIS — C220 Liver cell carcinoma: Secondary | ICD-10-CM

## 2019-06-02 DIAGNOSIS — Z23 Encounter for immunization: Secondary | ICD-10-CM | POA: Diagnosis not present

## 2019-06-02 DIAGNOSIS — C189 Malignant neoplasm of colon, unspecified: Secondary | ICD-10-CM | POA: Diagnosis not present

## 2019-06-02 MED ORDER — INFLUENZA VAC A&B SA ADJ QUAD 0.5 ML IM PRSY
0.5000 mL | PREFILLED_SYRINGE | Freq: Once | INTRAMUSCULAR | Status: AC
  Administered 2019-06-02: 0.5 mL via INTRAMUSCULAR
  Filled 2019-06-02: qty 0.5

## 2019-06-02 NOTE — Progress Notes (Signed)
[place], [place] [address]   CLINIC:  Medical Oncology/Hematology  PCP:  Earney Mallet, MD [address] [place] [address] [phone]   REASON FOR VISIT:  Follow-up for colon cancer, liver lesion and iron deficiency state.   BRIEF ONCOLOGIC HISTORY:  Oncology History  Colon cancer ([place])  10/10/2016 Initial Diagnosis   Colon cancer ([place])   10/10/2016 Surgery   Partial colectomy by Dr. Arnoldo Morale  Pathology shows  a 2.1 cm grade 2 adenocarcinoma of the ascending colon with invasion through the muscularis propria into the peri-colorectal tissues.      11/17/2016 PET scan   The hepatic lesion seen on the CT scan and MRI does not demonstrate hypermetabolism and is likely a benign entity.  Right maxillary sinus disease with associated hypermetabolism.  Scattered pulmonary nodules, likely benign. No follow-up needed if patient is low-risk (and has no known or suspected primary neoplasm). Non-contrast chest CT can be considered in 12 months if patient is high-risk.    12/25/2016 - 02/04/2017 Adjuvant Chemotherapy   Xeloda 2000mg  PO BID take for 14 days out of 21 days. Plan for total of 24 weeks of treatment.      CANCER STAGING: Cancer Staging Colon cancer [facility]) Staging form: Colon and Rectum, AJCC 8th Edition - Clinical: Stage IIA (cT3, cN0, cM0) - Signed by [name], MD on 11/26/2016    INTERVAL HISTORY:  Wanda Curry 73 y.o. female seen for follow-up of liver cancer.  She underwent CT scan recently.  Reports appetite of 100%.  Numbness in the fingers has been stable.  Lower extremity swelling is also stable.  Occasional diarrhea is present.  Reports decrease in energy levels.   REVIEW OF SYSTEMS:  Review of Systems  Constitutional: Positive for fatigue.  Cardiovascular: Positive for leg swelling.  Gastrointestinal: Positive for diarrhea.  Neurological: Positive for numbness.  All other systems reviewed and are  negative.    PAST MEDICAL/SURGICAL HISTORY:  Past Medical History:  Diagnosis Date  . Anemia   . Arthritis    RA  . Cancer ([place])    skin cancer  . Depression   . Diabetes mellitus without complication ([place])   . Fibromyalgia   . GERD (gastroesophageal reflux disease)   . Heart murmur    ECHO scheduled 05-25-2015  . Hyperlipidemia   . Neuropathy   . PONV (postoperative nausea and vomiting)    Past Surgical History:  Procedure Laterality Date  . ABDOMINAL HYSTERECTOMY    . BIOPSY  09/23/2016   Procedure: BIOPSY;  Surgeon: Danie Binder, MD;  [facility];  Service: Endoscopy;;  hepatic flexure mass  . CHOLECYSTECTOMY    . COLONOSCOPY N/A 08/13/2017   Procedure: COLONOSCOPY;  Surgeon: Daneil Dolin, MD;  [facility];  Service: Endoscopy;  Laterality: N/A;  . COLONOSCOPY WITH PROPOFOL N/A 09/23/2016   Procedure: COLONOSCOPY WITH PROPOFOL;  Surgeon: Danie Binder, MD;  [facility];  Service: Endoscopy;  Laterality: N/A;  7:30 am  . HARDWARE REMOVAL Right 05/30/2015   Procedure: HARDWARE REMOVAL;  Surgeon: Ninetta Lights, MD;  Location: [facility];  Service: Orthopedics;  Laterality: Right;  . PARTIAL COLECTOMY N/A 10/10/2016   Procedure: PARTIAL COLECTOMY;  Surgeon: Aviva Signs, MD;  Location: [facility];  Service: General;  Laterality: N/A;  . planter warts Bilateral    both feet  . POLYPECTOMY  09/23/2016   Procedure: POLYPECTOMY;  Surgeon: Danie Binder, MD;  [facility];  Service: Endoscopy;;  transverse colon polyps times 2, rectal polyp  . skin grafts     due to planter warts  . TOTAL KNEE ARTHROPLASTY Right 05/30/2015   Procedure: RIGHT TOTAL KNEE ARTHROPLASTY;  Surgeon: Ninetta Lights, MD;  Location: [facility];  Service: Orthopedics;  Laterality: Right;     SOCIAL HISTORY:  Social History   Socioeconomic History  . Marital status: Widowed    Spouse name: Not on file  . Number of children: Not on file  . Years of education: Not  on file  . Highest education level: Not on file  Occupational History  . Not on file  Social Needs  . Financial resource strain: Not on file  . Food insecurity    Worry: Not on file    Inability: Not on file  . Transportation needs    Medical: Not on file    Non-medical: Not on file  Tobacco Use  . Smoking status: Former Smoker    Packs/day: 1.00    Years: 40.00    Pack years: 40.00    Types: Cigarettes    Quit date: 08/18/2004    Years since quitting: 14.7  . Smokeless tobacco: Never Used  Substance and Sexual Activity  . Alcohol use: No  . Drug use: No  . Sexual activity: Not Currently    Birth control/protection: Surgical  Lifestyle  . Physical activity    Days per week: Not on file    Minutes per session: Not on file  . Stress: Not on file  Relationships  . Social Herbalist on phone: Not on file    Gets together: Not on file    Attends religious service: Not on file    Active member of club or organization: Not on file    Attends meetings of clubs or organizations: Not on file    Relationship status: Not on file  . Intimate partner violence    Fear of current or ex partner: Not on file    Emotionally abused: Not on file    Physically abused: Not on file    Forced sexual activity: Not on file  Other Topics Concern  . Not on file  Social History Narrative  . Not on file    FAMILY HISTORY:  Family History  Problem Relation Age of Onset  . Colon cancer Neg Hx     CURRENT MEDICATIONS:  Outpatient Encounter Medications as of 06/02/2019  Medication Sig Note  . acetaminophen ([employer] PAIN RELIEF EXTRA STRENGTH) 500 MG tablet Take 500 mg by mouth every 6 (six) hours as needed (for pain.).   Marland Kitchen atorvastatin (LIPITOR) 20 MG tablet Take 20 mg by mouth every morning.  05/09/2019: On hold   . Calcium Carb-Cholecalciferol (CALCIUM + D3) 600-200 MG-UNIT TABS Take 1 tablet by mouth 2 (two) times daily. 05/09/2019: ON HOLD   . carvedilol (COREG) 6.25 MG tablet Take  6.25 mg by mouth 2 (two) times daily with a meal.   . folic acid (FOLVITE) 1 MG tablet Take 1 mg by mouth daily.   . furosemide (LASIX) 20 MG tablet Take 20 mg by mouth daily as needed for edema.    . gabapentin (NEURONTIN) 600 MG tablet Take 600 mg by mouth 2 (two) times daily. MAY TAKE 3RD CAPSULE IF NEEDED   . Insulin Glargine (BASAGLAR KWIKPEN) 100 UNIT/ML SOPN Inject 36 Units into the skin every morning.    Marland Kitchen KLOR-CON M20 20 MEQ  tablet Take 20 mEq by mouth daily.    Marland Kitchen loperamide (IMODIUM) 2 MG capsule Take 2 mg by mouth daily.   Marland Kitchen losartan (COZAAR) 25 MG tablet Take 25 mg by mouth daily.   . Magnesium Gluconate 250 MG TABS Take 250 mg by mouth daily as needed (cramps).    . methotrexate (RHEUMATREX) 2.5 MG tablet Take 20 mg by mouth every Sunday. Caution:Chemotherapy. Protect from light.    . Milnacipran HCl (SAVELLA) 100 MG TABS tablet Take 100 mg by mouth 2 (two) times daily.   . Misc Natural Products (OSTEO BI-FLEX ADV JOINT SHIELD PO) Take by mouth 2 (two) times daily.   . Multiple Vitamins-Minerals (CENTRUM SILVER PO) Take 1 tablet by mouth daily.   Marland Kitchen NOVOLOG MIX 70/30 FLEXPEN (70-30) 100 UNIT/ML FlexPen Inject 30 Units into the skin 2 (two) times daily with a meal.    . omeprazole (PRILOSEC) 40 MG capsule Take 40 mg by mouth daily.    . rivaroxaban (XARELTO) 10 MG TABS tablet TAKE 1 TABLET (20 MG TOTAL) BY MOUTH DAILY WITH SUPPER. (Patient taking differently: Take 10 mg by mouth daily. TAKE 1 TABLET (10 MG TOTAL) BY MOUTH DAILY WITH SUPPER.) 05/11/2019: Last dose was 05/08/19  . rOPINIRole (REQUIP) 1 MG tablet Take 1 mg by mouth every morning. MAY TAKE 2 ADDITIONAL DOSES  AS NEEDED   . Vitamin D, Ergocalciferol, (DRISDOL) 50000 units CAPS capsule Take 50,000 Units by mouth every Wednesday. 05/09/2019: On hold    Facility-Administered Encounter Medications as of 06/02/2019  Medication  . 0.9 %  sodium chloride infusion  . [COMPLETED] influenza vaccine adjuvanted (FLUAD) injection 0.5 mL     ALLERGIES:  Allergies  Allergen Reactions  . Adhesive [Tape]     Adhesive tape and band-aids cause skin irritation  . Sulfa Antibiotics Nausea Only and Other (See Comments)    Joint paint  . Erythromycin Itching and Rash    burning     PHYSICAL EXAM:  ECOG Performance status: 2  Vitals:   06/02/19 1000  BP: (!) 144/57  Pulse: 94  Resp: 16  Temp: (!) 97.3 F (36.3 C)  SpO2: 100%   Filed Weights   06/02/19 1000  Weight: 254 lb 4 oz (115.3 kg)    Physical Exam Vitals signs reviewed.  Constitutional:      Appearance: Normal appearance.  Cardiovascular:     Rate and Rhythm: Normal rate and regular rhythm.     Heart sounds: Normal heart sounds.  Pulmonary:     Effort: Pulmonary effort is normal.     Breath sounds: Normal breath sounds.  Abdominal:     General: There is no distension.     Palpations: Abdomen is soft. There is no mass.  Musculoskeletal:        General: No swelling.  Skin:    General: Skin is warm.  Neurological:     General: No focal deficit present.     Mental Status: She is alert.  Psychiatric:        Mood and Affect: Mood normal.        Behavior: Behavior normal.      LABORATORY DATA:  I have reviewed the labs as listed.  CBC    Component Value Date/Time   WBC 11.4 (H) 05/11/2019 1103   RBC 4.14 05/11/2019 1103   HGB 13.4 05/11/2019 1103   HCT 40.7 05/11/2019 1103   PLT 255 05/11/2019 1103   MCV 98.3 05/11/2019 1103   MCH 32.4 05/11/2019  1103   MCHC 32.9 05/11/2019 1103   RDW 14.5 05/11/2019 1103   LYMPHSABS 2.2 03/29/2019 1409   MONOABS 0.7 03/29/2019 1409   EOSABS 0.1 03/29/2019 1409   BASOSABS 0.0 03/29/2019 1409   CMP Latest Ref Rng & Units 05/31/2019 03/29/2019 03/29/2019  Glucose 70 - 99 mg/dL - 96 -  BUN 8 - 23 mg/dL - 21 -  Creatinine 0.44 - 1.00 mg/dL 1.50(H) 1.04(H) 1.10(H)  Sodium 135 - 145 mmol/L - 141 -  Potassium 3.5 - 5.1 mmol/L - 4.6 -  Chloride 98 - 111 mmol/L - 105 -  CO2 22 - 32 mmol/L - 28 -  Calcium  8.9 - 10.3 mg/dL - 9.1 -  Total Protein 6.5 - 8.1 g/dL - 6.9 -  Total Bilirubin 0.3 - 1.2 mg/dL - 0.7 -  Alkaline Phos 38 - 126 U/L - 51 -  AST 15 - 41 U/L - 35 -  ALT 0 - 44 U/L - 34 -       DIAGNOSTIC IMAGING:  I have independently reviewed the scans and discussed with the patient.   I have reviewed Venita Lick LPN's note and agree with the documentation.  I personally performed a face-to-face visit, made revisions and my assessment and plan is as follows.    ASSESSMENT & PLAN:   Hepatocellular carcinoma ([place]) 1.  Hepatocellular carcinoma: -MRI of the abdomen with Eovist on 04/21/2019 showed hypoenhancing lesion in the liver, which was not typical for [facility].  It was not significantly hypermetabolic on prior PET/CT.  This lesion has been gradually increasing since 10/08/2016. - AFP was normal at 3.9. -Biopsy on 05/11/2019 shows well to moderately differentiated hepatocellular carcinoma, steatohepatitic variant. -CT chest on 05/30/2019 was negative for metastatic disease. -I have recommended locoregional therapy in the form of ablation/Y 90 TACE. -I will make a referral to interventional radiology. -I will see her back in 3 months for follow-up.  2.  Stage IIa (CT3CN0) colon cancer: -Right hemicolectomy on 10/10/2016, grade 2 adenocarcinoma, positive LVSI, margins negative, 0/19 lymph nodes positive. -Because of positive LVSI, she was recommended Xarelto.  She could not tolerate more than 2 cycles. -PET scan on 02/23/2018- for metastatic disease.  Liver mass was also not seen. -CEA on 03/29/2019 was 4.5. -CT AP on 03/29/2019 showed mass of the liver measuring 5.2 cm, previously 4.8 cm.  3.  Diarrhea: -She has watery diarrhea up to once per day.  She takes Imodium 1-2 tablets/day which helps.  She had this diarrhea since surgery.  3.  Unprovoked PE: -Incidental PE on CT chest in August 2018.  She was treated with Xarelto 20 mg for a year. -Dose was reduced to 10 mg daily in October  2019.  She is tolerating it without any problems.  4.  Iron deficiency state: -Last Feraheme was on 09/22/2018. - Hemoglobin was 13 on 05/11/2019.      Orders placed this encounter:  Orders Placed This Encounter  Procedures  . IR Radiologist Eval & Mgmt  . CBC with Differential/Platelet  . Comprehensive metabolic panel  . Iron and TIBC  . Ferritin  . Vitamin B12  . Folate  . CEA  . AFP tumor marker      Derek Jack, MD [place] [phone]

## 2019-06-02 NOTE — Assessment & Plan Note (Signed)
1.  Hepatocellular carcinoma: -MRI of the abdomen with Eovist on 04/21/2019 showed hypoenhancing lesion in the liver, which was not typical for [facility].  It was not significantly hypermetabolic on prior PET/CT.  This lesion has been gradually increasing since 10/08/2016. - AFP was normal at 3.9. -Biopsy on 05/11/2019 shows well to moderately differentiated hepatocellular carcinoma, steatohepatitic variant. -CT chest on 05/30/2019 was negative for metastatic disease. -I have recommended locoregional therapy in the form of ablation/Y 90 TACE. -I will make a referral to interventional radiology. -I will see her back in 3 months for follow-up.  2.  Stage IIa (CT3CN0) colon cancer: -Right hemicolectomy on 10/10/2016, grade 2 adenocarcinoma, positive LVSI, margins negative, 0/19 lymph nodes positive. -Because of positive LVSI, she was recommended Xarelto.  She could not tolerate more than 2 cycles. -PET scan on 02/23/2018- for metastatic disease.  Liver mass was also not seen. -CEA on 03/29/2019 was 4.5. -CT [facility] on 03/29/2019 showed mass of the liver measuring 5.2 cm, previously 4.8 cm.  3.  Diarrhea: -She has watery diarrhea up to once per day.  She takes Imodium 1-2 tablets/day which helps.  She had this diarrhea since surgery.  3.  Unprovoked PE: -Incidental PE on CT chest in August 2018.  She was treated with Xarelto 20 mg for a year. -Dose was reduced to 10 mg daily in October 2019.  She is tolerating it without any problems.  4.  Iron deficiency state: -Last Feraheme was on 09/22/2018. - Hemoglobin was 13 on 05/11/2019.

## 2019-06-02 NOTE — Patient Instructions (Addendum)
[place] at [facility] Discharge Instructions  You were seen today by Dr. Delton Coombes. He went over your recent test results. He will refer you to IR in [facility] for further treatment. He will see you back in 3 months for labs and follow up.   Thank you for choosing [place] at [facility] to provide your oncology and hematology care.  To afford each patient quality time with our provider, please arrive at least 15 minutes before your scheduled appointment time.   If you have a lab appointment with the [facility] please come in thru the  Main Entrance and check in at the main information desk  You need to re-schedule your appointment should you arrive 10 or more minutes late.  We strive to give you quality time with our providers, and arriving late affects you and other patients whose appointments are after yours.  Also, if you no show three or more times for appointments you may be dismissed from the clinic at the providers discretion.     Again, thank you for choosing [facility].  Our hope is that these requests will decrease the amount of time that you wait before being seen by our physicians.       _____________________________________________________________  Should you have questions after your visit to [facility], please contact our office at [phone] between the hours of 8:00 a.m. and 4:30 p.m.  Voicemails left after 4:00 p.m. will not be returned until the following business day.  For prescription refill requests, have your pharmacy contact our office and allow 72 hours.    Cancer Center Support Programs:   > Cancer Support Group  2nd Tuesday of the month 1pm-2pm, Journey Room

## 2019-06-09 ENCOUNTER — Other Ambulatory Visit: Payer: Self-pay | Admitting: Interventional Radiology

## 2019-06-09 ENCOUNTER — Ambulatory Visit
Admission: RE | Admit: 2019-06-09 | Discharge: 2019-06-09 | Disposition: A | Discharge status: Home / Self Care | Payer: Medicare Other | Source: Ambulatory Visit | Attending: Hematology | Admitting: Hematology

## 2019-06-09 ENCOUNTER — Other Ambulatory Visit: Payer: Self-pay

## 2019-06-09 ENCOUNTER — Encounter: Payer: Self-pay | Admitting: *Deleted

## 2019-06-09 DIAGNOSIS — C220 Liver cell carcinoma: Secondary | ICD-10-CM

## 2019-06-09 HISTORY — PX: IR RADIOLOGIST EVAL & MGMT: IMG5224

## 2019-06-09 NOTE — Consult Note (Signed)
Chief Complaint: Biopsy-proven hepatocellular carcinoma  Referring Physician(s): Katragadda,Sreedhar  History of Present Illness: Wanda Curry is a 73 y.o. female with past medical history significant for colon cancer, rheumatoid arthritis, diabetes and hyperlipidemia who was found to have a indeterminate slowly growing lesion within the central aspect of the liver, primarily affecting the caudate lobe initially seen on surveillance CT scan of the abdomen and pelvis performed 10/08/2016 at which time measuring approximately 3.2 x 2.6 cm found to slowly enlarge measuring 4.8 x 3.4 cm on CT of the abdomen pelvis performed 03/29/2019.  The lesion remained indeterminate on abdominal MRI performed 04/21/2019 and as such, the patient underwent ultrasound-guided liver lesion biopsy on 05/11/2019 with pathologic diagnosis of well to moderately differentiated hepatocellular carcinoma, steatohepatitic variant.  The patient is now seen in consultation via telemedicine for evaluation for potential percutaneous management of this biopsy-proven hepatocellular carcinoma.  The patient is accompanied on the phone call by her daughter.  Patient remains in her baseline state of well health.  She has no worsening abdominal pain.  No change in appetite or energy level.  No yellowing of the skin or eyes.      Past Medical History:  Diagnosis Date   Anemia    Arthritis    RA   Cancer ([place])    skin cancer   Depression    Diabetes mellitus without complication (HCC)    Fibromyalgia    GERD (gastroesophageal reflux disease)    Heart murmur    ECHO scheduled 05-25-2015   Hyperlipidemia    Neuropathy    PONV (postoperative nausea and vomiting)     Past Surgical History:  Procedure Laterality Date   ABDOMINAL HYSTERECTOMY     BIOPSY  09/23/2016   Procedure: BIOPSY;  Surgeon: Danie Binder, MD;  [facility]: [facility];  Service: Endoscopy;;  hepatic flexure mass   CHOLECYSTECTOMY      COLONOSCOPY N/A 08/13/2017   Procedure: COLONOSCOPY;  Surgeon: Daneil Dolin, MD;  Location: [facility];  Service: Endoscopy;  Laterality: N/A;   COLONOSCOPY WITH PROPOFOL N/A 09/23/2016   Procedure: COLONOSCOPY WITH PROPOFOL;  Surgeon: Danie Binder, MD;  [facility]: [facility];  Service: Endoscopy;  Laterality: N/A;  7:30 am   HARDWARE REMOVAL Right 05/30/2015   Procedure: HARDWARE REMOVAL;  Surgeon: Ninetta Lights, MD;  Location: [place];  Service: Orthopedics;  Laterality: Right;   PARTIAL COLECTOMY N/A 10/10/2016   Procedure: PARTIAL COLECTOMY;  Surgeon: Aviva Signs, MD;  Location: [facility];  Service: General;  Laterality: N/A;   planter warts Bilateral    both feet   POLYPECTOMY  09/23/2016   Procedure: POLYPECTOMY;  Surgeon: Danie Binder, MD;  Location: [facility];  Service: Endoscopy;;  transverse colon polyps times 2, rectal polyp   skin grafts     due to planter warts   TOTAL KNEE ARTHROPLASTY Right 05/30/2015   Procedure: RIGHT TOTAL KNEE ARTHROPLASTY;  Surgeon: Ninetta Lights, MD;  Location: [place];  Service: Orthopedics;  Laterality: Right;    Allergies: Adhesive [tape], Sulfa antibiotics, and Erythromycin  Medications: Prior to Admission medications   Medication Sig Start Date End Date Taking? Authorizing Provider  acetaminophen ([employer] PAIN RELIEF EXTRA STRENGTH) 500 MG tablet Take 500 mg by mouth every 6 (six) hours as needed (for pain.).    [provider]  atorvastatin (LIPITOR) 20 MG tablet Take 20 mg by mouth every morning.     [provider]  Calcium Carb-Cholecalciferol (  CALCIUM + D3) 600-200 MG-UNIT TABS Take 1 tablet by mouth 2 (two) times daily. 07/16/16   [provider]  carvedilol (COREG) 6.25 MG tablet Take 6.25 mg by mouth 2 (two) times daily with a meal.    [provider]  folic acid (FOLVITE) 1 MG tablet Take 1 mg by mouth daily.    [provider]  furosemide (LASIX) 20 MG tablet Take 20 mg by mouth  daily as needed for edema.     [provider]  gabapentin (NEURONTIN) 600 MG tablet Take 600 mg by mouth 2 (two) times daily. MAY TAKE 3RD CAPSULE IF NEEDED    [provider]  Insulin Glargine (BASAGLAR KWIKPEN) 100 UNIT/ML SOPN Inject 36 Units into the skin every morning.  12/23/17   [provider]  KLOR-CON M20 20 MEQ tablet Take 20 mEq by mouth daily.  07/28/17   [provider]  loperamide (IMODIUM) 2 MG capsule Take 2 mg by mouth daily.    [provider]  losartan (COZAAR) 25 MG tablet Take 25 mg by mouth daily. 04/20/19   [provider]  Magnesium Gluconate 250 MG TABS Take 250 mg by mouth daily as needed (cramps).     [provider]  methotrexate (RHEUMATREX) 2.5 MG tablet Take 20 mg by mouth every Sunday. Caution:Chemotherapy. Protect from light.     [provider]  Milnacipran HCl (SAVELLA) 100 MG TABS tablet Take 100 mg by mouth 2 (two) times daily.    [provider]  Misc Natural Products (OSTEO BI-FLEX ADV JOINT SHIELD PO) Take by mouth 2 (two) times daily.    [provider]  Multiple Vitamins-Minerals (CENTRUM SILVER PO) Take 1 tablet by mouth daily.    [provider]  NOVOLOG MIX 70/30 FLEXPEN (70-30) 100 UNIT/ML FlexPen Inject 30 Units into the skin 2 (two) times daily with a meal.  09/14/16   [provider]  omeprazole (PRILOSEC) 40 MG capsule Take 40 mg by mouth daily.  11/17/16   [provider]  rivaroxaban (XARELTO) 10 MG TABS tablet TAKE 1 TABLET (20 MG TOTAL) BY MOUTH DAILY WITH SUPPER. Patient taking differently: Take 10 mg by mouth daily. TAKE 1 TABLET (10 MG TOTAL) BY MOUTH DAILY WITH SUPPER. 06/16/18   Lockamy, Randi L, NP-C  rOPINIRole (REQUIP) 1 MG tablet Take 1 mg by mouth every morning. MAY TAKE 2 ADDITIONAL DOSES  AS NEEDED    [provider]  Vitamin D, Ergocalciferol, (DRISDOL) 50000 units CAPS capsule Take 50,000 Units by mouth every  Wednesday.    [provider]     Family History  Problem Relation Age of Onset   Colon cancer Neg Hx     Social History   Socioeconomic History   Marital status: Widowed    Spouse name: Not on file   Number of children: Not on file   Years of education: Not on file   Highest education level: Not on file  Occupational History   Not on file  Social Needs   Financial resource strain: Not on file   Food insecurity    Worry: Not on file    Inability: Not on file   Transportation needs    Medical: Not on file    Non-medical: Not on file  Tobacco Use   Smoking status: Former Smoker    Packs/day: 1.00    Years: 40.00    Pack years: 40.00    Types: Cigarettes    Quit  date: 08/18/2004    Years since quitting: 14.8   Smokeless tobacco: Never Used  Substance and Sexual Activity   Alcohol use: No   Drug use: No   Sexual activity: Not Currently    Birth control/protection: Surgical  Lifestyle   Physical activity    Days per week: Not on file    Minutes per session: Not on file   Stress: Not on file  Relationships   Social connections    Talks on phone: Not on file    Gets together: Not on file    Attends religious service: Not on file    Active member of club or organization: Not on file    Attends meetings of clubs or organizations: Not on file    Relationship status: Not on file  Other Topics Concern   Not on file  Social History Narrative   Not on file    ECOG Status: 1 - Symptomatic but completely ambulatory  Review of Systems  Review of Systems: A 12 point ROS discussed and pertinent positives are indicated in the HPI above.  All other systems are negative.  Physical Exam No direct physical exam was performed (except for noted visual exam findings with Video Visits).   Vital Signs: There were no vitals taken for this visit.  Imaging:  CT abdomen and pelvis - 10/08/2016; 06/23/2017; 09/16/2018; 8.06/2019 Abdominal MRI -  04/21/2019 Ultrasound-guided liver lesion biopsy - 05/11/2019 Chest CT-05/31/2019 PET/CT-02/23/2018  Review of the above examination demonstrates slow enlargement of nonhypermetabolic infiltrative lesion within the caudate measuring approximately 3.2 x 2.6 cm on the 09/2016 examination and approximately 4.8 x 3.7 cm on most recent abdominal CT performed 03/29/2019.  There is no evidence of metastatic disease with the chest, abdomen or pelvis.    Ct Chest W Contrast  Result Date: 06/01/2019 CLINICAL DATA:  Hepatocellular carcinoma.  Staging. EXAM: CT CHEST WITH CONTRAST TECHNIQUE: Multidetector CT imaging of the chest was performed during intravenous contrast administration. CONTRAST:  16mL OMNIPAQUE IOHEXOL 300 MG/ML  SOLN COMPARISON:  09/16/2018.  06/23/2017 FINDINGS: Cardiovascular: The heart size is normal. No substantial pericardial effusion. Coronary artery calcification is evident. Atherosclerotic calcification is noted in the wall of the thoracic aorta. Mediastinum/Nodes: Bilateral thyroid nodules noted including complex 2.2 cm hypoattenuating right thyroid lesion. No mediastinal lymphadenopathy. There is no hilar lymphadenopathy. The esophagus has normal imaging features. There is no axillary lymphadenopathy. Lungs/Pleura: Centrilobular emphsyema noted. 7 mm right middle lobe pulmonary nodule on 65/4 is stable since prior suggesting benign etiology. 2 mm left upper lobe nodule on 48/4 is unchanged, consistent with benign process. No focal airspace consolidation. Subpleural reticulation bilaterally suggest component of underlying fibrotic lung disease. No pleural effusion. Upper Abdomen: Central liver lesions seen on recent abdominal MRI is not well demonstrated on this CT scan. Musculoskeletal: No worrisome lytic or sclerotic osseous abnormality. IMPRESSION: 1. Stable exam since 09/16/2018. No findings to suggest metastatic disease in the thorax. 2. Tiny bilateral pulmonary nodules stable back to  06/23/2017, compatible with benign etiology. 3. 2.2 cm complex right thyroid nodule, progressed since 2018. Consider thyroid ultrasound further evaluate as clinically warranted. Electronically Signed   By: Misty Stanley M.D.   On: 06/01/2019 07:42   US Biopsy (liver)  Result Date: 05/11/2019 INDICATION: History of colon cancer, now with slowly enlarging mass within the central aspect of the liver. Please perform ultrasound-guided biopsy tissue diagnostic purposes. EXAM: ULTRASOUND GUIDED LIVER LESION BIOPSY COMPARISON:  Abdominal MRI-04/21/2019; 04/07/2019; CT abdomen and pelvis-03/29/2019; 10/08/2016 MEDICATIONS: None  ANESTHESIA/SEDATION: Fentanyl 50 mcg IV; Versed 1 mg IV Total Moderate Sedation time:  30 Minutes. The patient's level of consciousness and vital signs were monitored continuously by radiology nursing throughout the procedure under my direct supervision. COMPLICATIONS: None immediate. PROCEDURE: Informed written consent was obtained from the patient after a discussion of the risks, benefits and alternatives to treatment. The patient understands and consents the procedure. A timeout was performed prior to the initiation of the procedure. Ultrasound scanning was performed of the right upper abdominal quadrant demonstrates an ill-defined slightly hyperechoic approximately 3.1 x 3.0 x 2.6 cm mass within the central aspect of the liver compatible with the findings seen on preceding abdominal CT image 16, series 2). The procedure was planned. The right upper abdominal quadrant was prepped and draped in the usual sterile fashion. The overlying soft tissues were anesthetized with 1% lidocaine with epinephrine. A 17 gauge, 6.8 cm co-axial needle was advanced into a peripheral aspect of the lesion. This was followed by 3 core biopsies with an 18 gauge core device under direct ultrasound guidance. The coaxial needle tract was embolized with a small amount of Gel-Foam slurry and superficial hemostasis was  obtained with manual compression. Post procedural scanning was negative for definitive area of hemorrhage or additional complication. A dressing was placed. The patient tolerated the procedure well without immediate post procedural complication. IMPRESSION: Technically successful ultrasound guided core needle biopsy of ill-defined infiltrative mass within the central aspect of the liver. Electronically Signed   By: Sandi Mariscal M.D.   On: 05/11/2019 15:22    Labs:  CBC: Recent Labs    09/16/18 0914 12/20/18 1108 03/29/19 1409 05/11/19 1103  WBC 14.2* 8.9 11.6* 11.4*  HGB 13.8 12.3 12.7 13.4  HCT 44.1 39.4 40.7 40.7  PLT 258 231 247 255    COAGS: Recent Labs    05/11/19 1103  INR 1.1    BMP: Recent Labs    09/16/18 0914 12/20/18 1108 03/29/19 1344 03/29/19 1409 05/31/19 1658  NA 137 140  --  141  --   K 3.9 4.5  --  4.6  --   CL 100 104  --  105  --   CO2 26 27  --  28  --   GLUCOSE 249* 131*  --  96  --   BUN 33* 17  --  21  --   CALCIUM 9.0 9.0  --  9.1  --   CREATININE 1.00 1.00 1.10* 1.04* 1.50*  GFRNONAA 55* 55*  --  53*  --   GFRAA >60 >60  --  >60  --     LIVER FUNCTION TESTS: Recent Labs    09/16/18 0914 12/20/18 1108 03/29/19 1409  BILITOT 0.9 0.6 0.7  AST 27 25 35  ALT 33 28 34  ALKPHOS 56 53 51  PROT 7.3 6.6 6.9  ALBUMIN 3.8 3.4* 3.6    TUMOR MARKERS: No results for input(s): AFPTM, CEA, CA199, CHROMGRNA in the last 8760 hours.  Assessment and Plan:  Wanda Curry is a 73 y.o. female with past medical history significant for colon cancer, rheumatoid arthritis, diabetes and hyperlipidemia who was found to have a indeterminate slowly growing lesion within the central aspect of the liver, primarily affecting the caudate lobe initially seen on surveillance CT scan of the abdomen and pelvis performed 10/08/2016 at which time measuring approximately 3.2 x 2.6 cm found to slowly enlarge measuring 4.8 x 3.4 cm on CT of the abdomen pelvis performed  03/29/2019.  The lesion remained indeterminate on abdominal MRI performed 04/21/2019 and as such underwent ultrasound-guided liver lesion biopsy on 05/11/2019 with pathologic diagnosis of well to moderately differentiated hepatocellular carcinoma, steatohepatitic variant.  The patient remains in her baseline state of well health.   Selected images from the following examination were reviewed in detail: CT abdomen and pelvis - 10/08/2016; 06/23/2017; 09/16/2018; 8.06/2019 Abdominal MRI - 04/21/2019 Ultrasound-guided liver lesion biopsy - 05/11/2019 Chest CT-05/31/2019 PET/CT-02/23/2018  Review of the above examination demonstrates slow enlargement of nonhypermetabolic infiltrative lesion within the caudate measuring approximately 3.2 x 2.6 cm on the 09/2016 examination and approximately 4.8 x 3.7 cm on most recent abdominal CT performed 03/29/2019. There is no evidence of metastatic disease with the chest, abdomen or pelvis.  Given the size (greater than 3 cm) and location, within the central aspect of the liver, the patient is NOT a candidate for percutaneous ablative therapies given high likelihood of incomplete ablation as well as possibility of injury to the central biliary system.  As such, prolonged conversations were held with the patient regarding transcatheter palliative treatment options, specifically, bland hepatic embolization versus Y 90 segmentectomy. I explained that transcatheter procedures will be performed for palliative purposes however could provide some degree of disease improvement and/or stability for several months.    Following this prolonged and detailed conversation, the patient and the patient's wishes daughter wished to pursue further evaluation.    As such, I will obtain a contrast-enhanced cirrhotic protocol CTA of the abdomen and pelvis to better delineate the patient's hepatic vascular supply and ensure the patient is a candidate for either bland hepatic embolization or Y 90  embolization.    Note, non-CTA examination performed 03/29/2019 suggests separate origins of the common hepatic and splenic arteries in lieu of the conventional celiac trunk.  Additionally, there is a large amount of mixed calcified and noncalcified atherosclerotic plaque throughout the abdominal aorta with a potential hemodynamically significant stenosis at its mid aspect (image 29, series 2).  Following the acquisition of this planning CTA the patient will be seen in repeat telemedicine visit to finalize potential intervention.  PLAN:  - Obtain a cirrhotic protocol of the abdomen and pelvis to delineate hepatic vasculature supply prior to potential transcatheter intervention. - The patient will be seen in follow-up telemedicine consultation following acquisition of planning CTA.  The patient and the patient's daughter know to call the interventional radiology clinic with any interval questions or concerns.  Thank you for this interesting consult.  I greatly enjoyed meeting Wanda Curry and look forward to participating in their care.  A copy of this report was sent to the requesting provider on this date.  Electronically Signed: Sandi Mariscal 06/09/2019, 1:56 PM   I spent a total of 30 Minutes in remote  clinical consultation, greater than 50% of which was counseling/coordinating care for biopsy-proven hepatocellular carcinoma.  Visit type: Audio only (telephone). Audio (no video) only due to patient's lack of internet/smartphone capability. Alternative for in-person consultation at [facility], [address], [place], [place]. This visit type was conducted due to national recommendations for restrictions regarding the COVID-19 Pandemic (e.g. social distancing).  This format is felt to be most appropriate for this patient at this time.  All issues noted in this document were discussed and addressed.

## 2019-06-27 ENCOUNTER — Other Ambulatory Visit (HOSPITAL_COMMUNITY): Payer: Self-pay | Admitting: Nurse Practitioner

## 2019-06-27 ENCOUNTER — Ambulatory Visit (HOSPITAL_COMMUNITY): Admission: RE | Admit: 2019-06-27 | Payer: Medicare Other | Source: Ambulatory Visit

## 2019-06-27 DIAGNOSIS — C189 Malignant neoplasm of colon, unspecified: Secondary | ICD-10-CM

## 2019-06-27 DIAGNOSIS — I2699 Other pulmonary embolism without acute cor pulmonale: Secondary | ICD-10-CM

## 2019-07-01 ENCOUNTER — Other Ambulatory Visit: Payer: Self-pay

## 2019-07-01 ENCOUNTER — Encounter (HOSPITAL_COMMUNITY): Payer: Self-pay

## 2019-07-01 ENCOUNTER — Ambulatory Visit (HOSPITAL_COMMUNITY)
Admission: RE | Admit: 2019-07-01 | Discharge: 2019-07-01 | Disposition: A | Discharge status: Home / Self Care | Payer: Medicare Other | Source: Ambulatory Visit | Attending: Interventional Radiology | Admitting: Interventional Radiology

## 2019-07-01 DIAGNOSIS — C220 Liver cell carcinoma: Secondary | ICD-10-CM | POA: Insufficient documentation

## 2019-07-01 MED ORDER — IOHEXOL 350 MG/ML SOLN
80.0000 mL | Freq: Once | INTRAVENOUS | Status: AC | PRN
  Administered 2019-07-01: 80 mL via INTRAVENOUS

## 2019-07-07 ENCOUNTER — Ambulatory Visit
Admission: RE | Admit: 2019-07-07 | Discharge: 2019-07-07 | Disposition: A | Discharge status: Home / Self Care | Payer: Medicare Other | Source: Ambulatory Visit | Attending: Interventional Radiology | Admitting: Interventional Radiology

## 2019-07-07 ENCOUNTER — Other Ambulatory Visit: Payer: Self-pay

## 2019-07-07 DIAGNOSIS — C220 Liver cell carcinoma: Secondary | ICD-10-CM

## 2019-07-07 HISTORY — PX: IR RADIOLOGIST EVAL & MGMT: IMG5224

## 2019-07-07 NOTE — Progress Notes (Signed)
Patient ID: Geonna Dickel, female   DOB: 08/20/1945, 73 y.o.   MRN: [id number]         Chief Complaint: Biopsy-proven hepatocellular carcinoma  Referring Physician(s): Delton Coombes  History of Present Illness: Oktober Glueckert is a 73 y.o. female with past medical history significant for colon cancer, rheumatoid arthritis, diabetes and hyperlipidemia who was found to have an indeterminate slowly growing lesion within the central aspect of the liver for which he ultimately underwent ultrasound-guided liver biopsy on 05/11/2019 with pathologic diagnosis of well to moderately differentiated hepatocellular carcinoma.   The patient is seen in consultation today via telemedicine following acquisition of planning CT of the abdomen and pelvis performed 07/01/2019.  The patient's daughter accompanies the patient on today's telemedicine consultation and serves as the primary historian due to patient's difficulty hearing.  The patient remains in her baseline state of fair health.  No change in patient's baseline chronic fatigue.  The patient remains independent with most ADLs but requires assistance for outside the home.  No worsening abdominal pain. No change in appetite.  No unintentional weight loss or weight gain. no yellowing of the skin or eyes.   Past Medical History:  Diagnosis Date   Anemia    Arthritis    RA   Cancer ([place])    skin cancer   Depression    Diabetes mellitus without complication (HCC)    Fibromyalgia    GERD (gastroesophageal reflux disease)    Heart murmur    ECHO scheduled 05-25-2015   Hyperlipidemia    Neuropathy    PONV (postoperative nausea and vomiting)     Past Surgical History:  Procedure Laterality Date   ABDOMINAL HYSTERECTOMY     BIOPSY  09/23/2016   Procedure: BIOPSY;  Surgeon: Danie Binder, MD;  [facility];  Service: Endoscopy;;  hepatic flexure mass   CHOLECYSTECTOMY     COLONOSCOPY N/A 08/13/2017   Procedure: COLONOSCOPY;   Surgeon: Daneil Dolin, MD;  [facility];  Service: Endoscopy;  Laterality: N/A;   COLONOSCOPY WITH PROPOFOL N/A 09/23/2016   Procedure: COLONOSCOPY WITH PROPOFOL;  Surgeon: Danie Binder, MD;  [facility];  Service: Endoscopy;  Laterality: N/A;  7:30 am   HARDWARE REMOVAL Right 05/30/2015   Procedure: HARDWARE REMOVAL;  Surgeon: Ninetta Lights, MD;  Location: [place];  Service: Orthopedics;  Laterality: Right;   IR RADIOLOGIST EVAL & MGMT  06/09/2019   PARTIAL COLECTOMY N/A 10/10/2016   Procedure: PARTIAL COLECTOMY;  Surgeon: Aviva Signs, MD;  Location: [facility];  Service: General;  Laterality: N/A;   planter warts Bilateral    both feet   POLYPECTOMY  09/23/2016   Procedure: POLYPECTOMY;  Surgeon: Danie Binder, MD;  [facility];  Service: Endoscopy;;  transverse colon polyps times 2, rectal polyp   skin grafts     due to planter warts   TOTAL KNEE ARTHROPLASTY Right 05/30/2015   Procedure: RIGHT TOTAL KNEE ARTHROPLASTY;  Surgeon: Ninetta Lights, MD;  Location: [place];  Service: Orthopedics;  Laterality: Right;    Allergies: Adhesive [tape], Sulfa antibiotics, and Erythromycin  Medications: Prior to Admission medications   Medication Sig Start Date End Date Taking? Authorizing Provider  acetaminophen ([employer] PAIN RELIEF EXTRA STRENGTH) 500 MG tablet Take 500 mg by mouth every 6 (six) hours as needed (for pain.).    [provider]  atorvastatin (LIPITOR) 20 MG tablet Take 20 mg by mouth every morning.  [provider]  Calcium Carb-Cholecalciferol (CALCIUM + D3) 600-200 MG-UNIT TABS Take 1 tablet by mouth 2 (two) times daily. 07/16/16   [provider]  carvedilol (COREG) 6.25 MG tablet Take 6.25 mg by mouth 2 (two) times daily with a meal.    [provider]  folic acid (FOLVITE) 1 MG tablet Take 1 mg by mouth daily.    [provider]  furosemide (LASIX) 20 MG tablet Take 20 mg by mouth daily as  needed for edema.     [provider]  gabapentin (NEURONTIN) 600 MG tablet Take 600 mg by mouth 2 (two) times daily. MAY TAKE 3RD CAPSULE IF NEEDED    [provider]  Insulin Glargine (BASAGLAR KWIKPEN) 100 UNIT/ML SOPN Inject 36 Units into the skin every morning.  12/23/17   [provider]  KLOR-CON M20 20 MEQ tablet Take 20 mEq by mouth daily.  07/28/17   [provider]  loperamide (IMODIUM) 2 MG capsule Take 2 mg by mouth daily.    [provider]  losartan (COZAAR) 25 MG tablet Take 25 mg by mouth daily. 04/20/19   [provider]  Magnesium Gluconate 250 MG TABS Take 250 mg by mouth daily as needed (cramps).     [provider]  methotrexate (RHEUMATREX) 2.5 MG tablet Take 20 mg by mouth every Sunday. Caution:Chemotherapy. Protect from light.     [provider]  Milnacipran HCl (SAVELLA) 100 MG TABS tablet Take 100 mg by mouth 2 (two) times daily.    [provider]  Misc Natural Products (OSTEO BI-FLEX ADV JOINT SHIELD PO) Take by mouth 2 (two) times daily.    [provider]  Multiple Vitamins-Minerals (CENTRUM SILVER PO) Take 1 tablet by mouth daily.    [provider]  NOVOLOG MIX 70/30 FLEXPEN (70-30) 100 UNIT/ML FlexPen Inject 30 Units into the skin 2 (two) times daily with a meal.  09/14/16   [provider]  omeprazole (PRILOSEC) 40 MG capsule Take 40 mg by mouth daily.  11/17/16   [provider]  rOPINIRole (REQUIP) 1 MG tablet Take 1 mg by mouth every morning. MAY TAKE 2 ADDITIONAL DOSES  AS NEEDED    [provider]  Vitamin D, Ergocalciferol, (DRISDOL) 50000 units CAPS capsule Take 50,000 Units by mouth every Wednesday.    [provider]  XARELTO 10 MG TABS tablet TAKE 1 TABLET BY MOUTH DAILY WITH SUPPER 06/28/19   Glennie Isle, NP-C     Family History  Problem Relation Age of Onset   Colon cancer Neg Hx     Social History    Socioeconomic History   Marital status: Widowed    Spouse name: Not on file   Number of children: Not on file   Years of education: Not on file   Highest education level: Not on file  Occupational History   Not on file  Social Needs   Financial resource strain: Not on file   Food insecurity    Worry: Not on file    Inability: Not on file   Transportation needs    Medical: Not on file    Non-medical: Not on file  Tobacco Use   Smoking status: Former Smoker    Packs/day: 1.00    Years: 40.00    Pack years: 40.00    Types: Cigarettes    Quit date: 08/18/2004    Years since quitting: 14.8   Smokeless tobacco: Never Used  Substance and  Sexual Activity   Alcohol use: No   Drug use: No   Sexual activity: Not Currently    Birth control/protection: Surgical  Lifestyle   Physical activity    Days per week: Not on file    Minutes per session: Not on file   Stress: Not on file  Relationships   Social connections    Talks on phone: Not on file    Gets together: Not on file    Attends religious service: Not on file    Active member of club or organization: Not on file    Attends meetings of clubs or organizations: Not on file    Relationship status: Not on file  Other Topics Concern   Not on file  Social History Narrative   Not on file    ECOG Status: 1 - Symptomatic but completely ambulatory  Review of Systems  Review of Systems: A 12 point ROS discussed and pertinent positives are indicated in the HPI above.  All other systems are negative.  Physical Exam No direct physical exam was performed (except for noted visual exam findings with Video Visits).   Vital Signs: There were no vitals taken for this visit.  Imaging:   CT abdomen and pelvis - 10/08/2016; 06/23/2017; 09/16/2018; 8.06/2019 Abdominal MRI - 04/21/2019 Ultrasound-guided liver lesion biopsy - 05/11/2019 Chest CT-05/31/2019 PET/CT-02/23/2018  Review of the above examination  demonstrates slow enlargement of nonhypermetabolic infiltrative lesion within the caudate measuring approximately 3.2 x 2.6 cm on the 09/2016 examination and approximately 4.8 x 3.7 cm on most recent abdominal CT performed 03/29/2019.  There is no evidence of metastatic disease with the chest, abdomen or pelvis.  Ir Radiologist Eval & Mgmt  Result Date: 06/09/2019 Please refer to notes tab for details about interventional procedure. (Op Note)  Ct Angio Abd/pel W/ And/or W/o  Result Date: 07/02/2019 CLINICAL DATA:  History of colon cancer now with slowly enlarging lesion within the caudate lobe post ultrasound-guided biopsy on 05/11/2027 with pathology compatible with separate hepatocellular primary. Please perform CT of the abdomen pelvis to evaluate hepatic vascular anatomy prior to potential trans catheter therapy. EXAM: CTA ABDOMEN AND PELVIS WITHOUT AND WITH CONTRAST TECHNIQUE: Multidetector CT imaging of the abdomen and pelvis was performed using the standard protocol during bolus administration of intravenous contrast. Multiplanar reconstructed images and MIPs were obtained and reviewed to evaluate the vascular anatomy. CONTRAST:  41mL OMNIPAQUE IOHEXOL 350 MG/ML SOLN COMPARISON:  Abdominal MRI-04/21/2019; ultrasound-guided liver mass biopsy-05/11/2019; chest CT-05/31/2019 FINDINGS: VASCULAR Aorta: There is a large amount of irregular predominantly calcified atherosclerotic plaque throughout the normal caliber abdominal aorta. Focal eccentric calcified atherosclerotic plaque involving the mid aspect of the infrarenal abdominal aorta results in short segment approximately 50% luminal narrowing (image 76, series 5). No evidence of abdominal aortic dissection or periaortic stranding. Celiac: There is separate origin of the splenic and common hepatic arteries in lieu of a conventional celiac trunk. The splenic artery gives rise to the left gastric artery. Conventional configuration of the hepatic  arterial system. The ill-defined lesion within the caudate appears to be predominantly supplied via the left hepatic artery. SMA: Mildly disease though widely patent without hemodynamically significant narrowing. Conventional branching pattern. The distal tributaries the SMA appear widely patent without discrete intraluminal filling defect suggest distal embolism. Renals: Solitary right-sided renal artery; there are 3 discrete left-sided renal arteries with accessory renal arteries supplying the superior and inferior poles of the left kidney. Both dominant right-sided renal arteries are mildly disease at their origin  though widely patent without hemodynamically significant narrowing. No vessel irregularity to suggest FMD. IMA: Widely patent without hemodynamically significant narrowing. Inflow: There is a large amount of irregular predominantly calcified atherosclerotic plaque involving the bilateral common iliac arteries, not definitely resulting in hemodynamically significant stenosis. The bilateral external and internal iliac arteries are diseased though patent of normal caliber. Proximal Outflow: Eccentric calcified atherosclerotic plaque results in approximately 50% luminal narrowing of the bilateral common femoral arteries (image 76, series 12). Veins: The IVC and pelvic venous systems appear widely patent Review of the MIP images confirms the above findings. _________________________________________________________ NON-VASCULAR Lower chest: Limited visualization of the lower thorax demonstrates minimal dependent subpleural ground-glass atelectasis, right greater than left. No discrete focal airspace opacities. No pleural effusion. Normal heart size.  No pericardial effusion. Hepatobiliary: There is diffuse decreased attenuation of the hepatic parenchyma on this postcontrast examination. This finding is associated with mild nodularity of the hepatic contour. Redemonstrated ill-defined approximately 4.7 x 4.3 x  3.6 cm hypoattenuating mass within the caudate lobe (coronal image 70, series 14; axial image 19, series 12, similar in size and appearance compared to abdominal MRI performed 04/21/2019, previously, 4.9 x 3.6 cm. There is a minimal amount of focal fatty infiltration adjacent to the fissure for the ligamentum teres. No new discrete hepatic lesions. Post cholecystectomy. No intra extrahepatic bili duct dilatation. No ascites. Pancreas: Normal appearance of the pancreas. Spleen: Normal appearance of the spleen. Adrenals/Urinary Tract: There is symmetric enhancement of the bilateral kidneys. No evidence of nephrolithiasis on this postcontrast examination. There is a minimal amount of grossly symmetric likely age and body habitus related bilateral perinephric stranding. No urine obstruction. Normal appearance the bilateral adrenal glands. There is mild diffuse thickening the urinary bladder wall, potentially accentuated due to underdistention. Stomach/Bowel: Scattered colonic diverticulosis without evidence of superimposed acute diverticulitis. Sequela of prior right hemicolectomy. No evidence of enteric obstruction. No pneumoperitoneum, pneumatosis or portal venous gas. Lymphatic: No bulky retroperitoneal, mesenteric or porta hepatis lymphadenopathy. Reproductive: Post hysterectomy. No discrete adnexal lesion. No free fluid the pelvic cul-de-sac. Other: Subcutaneous stranding about the undersurface of the abdominal pannus may represent the location of subcutaneous medication administration. Tiny mesenteric fat containing periumbilical hernia. Musculoskeletal: No acute or aggressive osseous abnormalities. IMPRESSION: VASCULAR 1. Large amount of atherosclerotic plaque within normal caliber abdominal aorta with short-segment approximately 50% luminal narrowing involving mid aspect of the infrarenal abdominal aorta. No evidence of abdominal aortic dissection or periaortic stranding. Aortic Atherosclerosis (ICD10-I70.0). 2.  Non conventional mesenteric anatomy including separate origins of the common hepatic and splenic arteries arising directly from the abdominal aorta in lieu of a common celiac trunk. 3. Ill-defined lesion within the caudate a appears to be predominantly supplied via the left hepatic artery. 4. Suspected 50% luminal narrowing involving the bilateral common femoral arteries. NON-VASCULAR 1. Ill-defined approximately 4.7 cm mass involving the caudate is grossly unchanged compared to abdominal MRI performed 04/21/2019. No new discrete hepatic lesions. 2. Stable sequela of prior right hemicolectomy without evidence of enteric obstruction. 3. Similar findings suggestive of hepatic steatosis and early cirrhotic change. Electronically Signed   By: Sandi Mariscal M.D.   On: 07/02/2019 08:23    Labs:  CBC: Recent Labs    09/16/18 0914 12/20/18 1108 03/29/19 1409 05/11/19 1103  WBC 14.2* 8.9 11.6* 11.4*  HGB 13.8 12.3 12.7 13.4  HCT 44.1 39.4 40.7 40.7  PLT 258 231 247 255    COAGS: Recent Labs    05/11/19 1103  INR 1.1  BMP: Recent Labs    09/16/18 0914 12/20/18 1108 03/29/19 1344 03/29/19 1409 05/31/19 1658  NA 137 140  --  141  --   K 3.9 4.5  --  4.6  --   CL 100 104  --  105  --   CO2 26 27  --  28  --   GLUCOSE 249* 131*  --  96  --   BUN 33* 17  --  21  --   CALCIUM 9.0 9.0  --  9.1  --   CREATININE 1.00 1.00 1.10* 1.04* 1.50*  GFRNONAA 55* 55*  --  53*  --   GFRAA >60 >60  --  >60  --     LIVER FUNCTION TESTS: Recent Labs    09/16/18 0914 12/20/18 1108 03/29/19 1409  BILITOT 0.9 0.6 0.7  AST 27 25 35  ALT 33 28 34  ALKPHOS 56 53 51  PROT 7.3 6.6 6.9  ALBUMIN 3.8 3.4* 3.6    TUMOR MARKERS: No results for input(s): AFPTM, CEA, CA199, CHROMGRNA in the last 8760 hours.  Assessment and Plan:  Theodocia Drinkard is a 72 y.o. female with past medical history significant for colon cancer, rheumatoid arthritis, diabetes and hyperlipidemia who was found to have an  indeterminate slowly growing lesion within the central aspect of the liver for which he ultimately underwent ultrasound-guided liver biopsy on 05/11/2019 with pathologic diagnosis of well to moderately differentiated hepatocellular carcinoma.   The patient is seen in consultation today via telemedicine following acquisition of planning CT of the abdomen and pelvis performed 07/01/2019.  Again, given the size (greater than 3 cm) and location, within the central aspect of the liver, the patient is NOT a candidate for percutaneous ablative therapies given high likelihood of incomplete ablation as well as possibility of injury to the central biliary system.  Following personal review of CT of the abdomen pelvis performed 07/02/2019, I feel bland embolization is a better option for this patient (as opposed to Y-90 embolization) given the significant amount of atherosclerotic plaque including suspected hemodynamically significant narrowings involving the bilateral common femoral arteries as well as the mid aspect of the abdominal aorta. Additionally, there is nonconventional anatomy with separate origins of the common hepatic and splenic arteries in lieu of a typical celiac trunk, rendering the required repeat procedures for Y 90 radioembolization difficult and challenging.  As such, given the technical difficulty with attempted mesenteric arteriogram, I feel a solitary procedure with bland embolization is the most feasible intervention especially considering both the Y 90 radioembolization and bland embolization are performed for palliative purposes. Note, there is preserved patency of the portal venous system, a necessary requirement for bland embolization.  Following this prolonged detailed conversation with the patient, the patient wishes to pursue hepatic bland embolization.  As such, the procedure will be performed at [facility] with conscious sedation. The procedure will entail an overnight  admission for continued observation and PCA usage.   The patient knows to call the interventional radiology clinic with any interval questions or concerns.  Plan: - Schedule mesenteric arteriogram and planned embolization at [facility]. The procedure will entail an overnight admission for continued observation and PCA usage.   Thank you for this interesting consult.  I greatly enjoyed meeting Yasuko Pozo and look forward to participating in their care.  A copy of this report was sent to the requesting provider on this date.  Electronically Signed: Sandi Mariscal 07/07/2019, 11:11 AM  I spent a total of 15 Minutes in remote  clinical consultation, greater than 50% of which was counseling/coordinating care for biopsy-proven hepatocellular carcinoma.    Visit type: Audio only (telephone). Audio (no video) only due to patient's lack of internet/smartphone capability. Alternative for in-person consultation at [facility], [address], [place], [place]. This visit type was conducted due to national recommendations for restrictions regarding the COVID-19 Pandemic (e.g. social distancing).  This format is felt to be most appropriate for this patient at this time.  All issues noted in this document were discussed and addressed.

## 2019-07-11 ENCOUNTER — Other Ambulatory Visit (HOSPITAL_COMMUNITY): Payer: Self-pay | Admitting: Interventional Radiology

## 2019-07-11 DIAGNOSIS — C220 Liver cell carcinoma: Secondary | ICD-10-CM

## 2019-07-21 ENCOUNTER — Inpatient Hospital Stay (HOSPITAL_COMMUNITY): Admission: RE | Admit: 2019-07-21 | Payer: Medicare Other | Source: Ambulatory Visit

## 2019-07-21 ENCOUNTER — Other Ambulatory Visit: Payer: Self-pay

## 2019-07-21 DIAGNOSIS — Z20822 Contact with and (suspected) exposure to covid-19: Secondary | ICD-10-CM

## 2019-07-22 ENCOUNTER — Other Ambulatory Visit: Payer: Self-pay | Admitting: Radiology

## 2019-07-23 LAB — NOVEL CORONAVIRUS, NAA: SARS-CoV-2, NAA: NOT DETECTED

## 2019-07-25 ENCOUNTER — Ambulatory Visit (HOSPITAL_COMMUNITY)
Admission: RE | Admit: 2019-07-25 | Discharge: 2019-07-25 | Disposition: A | Discharge status: Home / Self Care | Payer: Medicare Other | Source: Ambulatory Visit

## 2019-07-25 ENCOUNTER — Encounter (HOSPITAL_COMMUNITY): Payer: Self-pay

## 2019-07-25 ENCOUNTER — Other Ambulatory Visit: Payer: Self-pay

## 2019-07-25 ENCOUNTER — Observation Stay (HOSPITAL_COMMUNITY)
Admission: RE | Admit: 2019-07-25 | Discharge: 2019-07-26 | Disposition: A | Discharge status: Home / Self Care | Payer: Medicare Other | Source: Ambulatory Visit | Attending: Interventional Radiology | Admitting: Interventional Radiology

## 2019-07-25 DIAGNOSIS — M069 Rheumatoid arthritis, unspecified: Secondary | ICD-10-CM | POA: Insufficient documentation

## 2019-07-25 DIAGNOSIS — E785 Hyperlipidemia, unspecified: Secondary | ICD-10-CM | POA: Insufficient documentation

## 2019-07-25 DIAGNOSIS — C220 Liver cell carcinoma: Principal | ICD-10-CM | POA: Diagnosis present

## 2019-07-25 DIAGNOSIS — Z85828 Personal history of other malignant neoplasm of skin: Secondary | ICD-10-CM | POA: Diagnosis not present

## 2019-07-25 DIAGNOSIS — M797 Fibromyalgia: Secondary | ICD-10-CM | POA: Insufficient documentation

## 2019-07-25 DIAGNOSIS — Z79899 Other long term (current) drug therapy: Secondary | ICD-10-CM | POA: Insufficient documentation

## 2019-07-25 DIAGNOSIS — Z794 Long term (current) use of insulin: Secondary | ICD-10-CM | POA: Diagnosis not present

## 2019-07-25 DIAGNOSIS — E1140 Type 2 diabetes mellitus with diabetic neuropathy, unspecified: Secondary | ICD-10-CM | POA: Diagnosis not present

## 2019-07-25 DIAGNOSIS — Z87891 Personal history of nicotine dependence: Secondary | ICD-10-CM | POA: Diagnosis not present

## 2019-07-25 DIAGNOSIS — Z7901 Long term (current) use of anticoagulants: Secondary | ICD-10-CM | POA: Insufficient documentation

## 2019-07-25 DIAGNOSIS — K219 Gastro-esophageal reflux disease without esophagitis: Secondary | ICD-10-CM | POA: Diagnosis not present

## 2019-07-25 HISTORY — PX: IR EMBO TUMOR ORGAN ISCHEMIA INFARCT INC GUIDE ROADMAPPING: IMG5449

## 2019-07-25 HISTORY — PX: IR ANGIOGRAM SELECTIVE EACH ADDITIONAL VESSEL: IMG667

## 2019-07-25 HISTORY — PX: IR ANGIOGRAM VISCERAL SELECTIVE: IMG657

## 2019-07-25 HISTORY — PX: IR US GUIDE VASC ACCESS RIGHT: IMG2390

## 2019-07-25 LAB — CBC WITH DIFFERENTIAL/PLATELET
Abs Immature Granulocytes: 0.03 10*3/uL (ref 0.00–0.07)
Basophils Absolute: 0 10*3/uL (ref 0.0–0.1)
Basophils Relative: 0 %
Eosinophils Absolute: 0.2 10*3/uL (ref 0.0–0.5)
Eosinophils Relative: 2 %
HCT: 39.8 % (ref 36.0–46.0)
Hemoglobin: 12.5 g/dL (ref 12.0–15.0)
Immature Granulocytes: 0 %
Lymphocytes Relative: 21 %
Lymphs Abs: 1.8 10*3/uL (ref 0.7–4.0)
MCH: 30.8 pg (ref 26.0–34.0)
MCHC: 31.4 g/dL (ref 30.0–36.0)
MCV: 98 fL (ref 80.0–100.0)
Monocytes Absolute: 0.7 10*3/uL (ref 0.1–1.0)
Monocytes Relative: 9 %
Neutro Abs: 5.9 10*3/uL (ref 1.7–7.7)
Neutrophils Relative %: 68 %
Platelets: 234 10*3/uL (ref 150–400)
RBC: 4.06 MIL/uL (ref 3.87–5.11)
RDW: 13.6 % (ref 11.5–15.5)
WBC: 8.7 10*3/uL (ref 4.0–10.5)
nRBC: 0 % (ref 0.0–0.2)

## 2019-07-25 LAB — TYPE AND SCREEN
ABO/RH(D): O POS
Antibody Screen: NEGATIVE

## 2019-07-25 LAB — GLUCOSE, CAPILLARY
Glucose-Capillary: 166 mg/dL — ABNORMAL HIGH (ref 70–99)
Glucose-Capillary: 168 mg/dL — ABNORMAL HIGH (ref 70–99)

## 2019-07-25 LAB — COMPREHENSIVE METABOLIC PANEL
ALT: 30 U/L (ref 0–44)
AST: 29 U/L (ref 15–41)
Albumin: 3.2 g/dL — ABNORMAL LOW (ref 3.5–5.0)
Alkaline Phosphatase: 57 U/L (ref 38–126)
Anion gap: 10 (ref 5–15)
BUN: 18 mg/dL (ref 8–23)
CO2: 25 mmol/L (ref 22–32)
Calcium: 9.1 mg/dL (ref 8.9–10.3)
Chloride: 105 mmol/L (ref 98–111)
Creatinine, Ser: 0.96 mg/dL (ref 0.44–1.00)
GFR calc Af Amer: >60 mL/min (ref >60)
GFR calc non Af Amer: 58 mL/min — ABNORMAL LOW (ref >60)
Glucose, Bld: 177 mg/dL — ABNORMAL HIGH (ref 70–99)
Potassium: 4.8 mmol/L (ref 3.5–5.1)
Sodium: 140 mmol/L (ref 135–145)
Total Bilirubin: 0.3 mg/dL (ref 0.3–1.2)
Total Protein: 6.2 g/dL — ABNORMAL LOW (ref 6.5–8.1)

## 2019-07-25 LAB — PROTIME-INR
INR: 1 (ref 0.8–1.2)
Prothrombin Time: 12.8 seconds (ref 11.4–15.2)

## 2019-07-25 LAB — ABO/RH: ABO/RH(D): O POS

## 2019-07-25 MED ORDER — HYDROMORPHONE 1 MG/ML IV SOLN
INTRAVENOUS | Status: DC
  Administered 2019-07-25: 17:00:00 via INTRAVENOUS
  Administered 2019-07-25: 2.8 mg via INTRAVENOUS
  Administered 2019-07-26: 0.6 mg via INTRAVENOUS
  Administered 2019-07-26: 0.2 mg via INTRAVENOUS
  Administered 2019-07-26: 1 mg via INTRAVENOUS
  Filled 2019-07-25: qty 30

## 2019-07-25 MED ORDER — LOPERAMIDE HCL 2 MG PO CAPS
2.0000 mg | ORAL_CAPSULE | Freq: Every day | ORAL | Status: DC
  Administered 2019-07-26: 2 mg via ORAL
  Filled 2019-07-25: qty 1

## 2019-07-25 MED ORDER — MIDAZOLAM HCL 2 MG/2ML IJ SOLN
INTRAMUSCULAR | Status: AC
  Filled 2019-07-25: qty 4

## 2019-07-25 MED ORDER — IOHEXOL 300 MG/ML  SOLN
100.0000 mL | Freq: Once | INTRAMUSCULAR | Status: AC | PRN
  Administered 2019-07-25: 10 mL via INTRA_ARTERIAL

## 2019-07-25 MED ORDER — FOLIC ACID 1 MG PO TABS
1.0000 mg | ORAL_TABLET | Freq: Every day | ORAL | Status: DC
  Administered 2019-07-26: 1 mg via ORAL
  Filled 2019-07-25: qty 1

## 2019-07-25 MED ORDER — PIPERACILLIN-TAZOBACTAM 3.375 G IVPB
INTRAVENOUS | Status: AC
  Administered 2019-07-25: 3.375 g via INTRAVENOUS
  Filled 2019-07-25: qty 50

## 2019-07-25 MED ORDER — SODIUM CHLORIDE 0.9% FLUSH: 9.0000 mL | INTRAVENOUS | Status: DC | PRN

## 2019-07-25 MED ORDER — FENTANYL CITRATE (PF) 100 MCG/2ML IJ SOLN
INTRAMUSCULAR | Status: AC
  Filled 2019-07-25: qty 4

## 2019-07-25 MED ORDER — DIPHENHYDRAMINE HCL 50 MG/ML IJ SOLN: 12.5000 mg | Freq: Four times a day (QID) | INTRAMUSCULAR | Status: DC | PRN

## 2019-07-25 MED ORDER — GABAPENTIN 300 MG PO CAPS
600.0000 mg | ORAL_CAPSULE | Freq: Two times a day (BID) | ORAL | Status: DC
  Administered 2019-07-25 – 2019-07-26 (×2): 600 mg via ORAL
  Filled 2019-07-25 (×2): qty 2

## 2019-07-25 MED ORDER — LOSARTAN POTASSIUM 25 MG PO TABS
25.0000 mg | ORAL_TABLET | Freq: Every day | ORAL | Status: DC
  Administered 2019-07-26: 25 mg via ORAL
  Filled 2019-07-25: qty 1

## 2019-07-25 MED ORDER — SODIUM CHLORIDE 0.9% FLUSH: 3.0000 mL | Freq: Two times a day (BID) | INTRAVENOUS | Status: DC

## 2019-07-25 MED ORDER — INSULIN GLARGINE 100 UNIT/ML ~~LOC~~ SOLN
36.0000 [IU] | Freq: Every morning | SUBCUTANEOUS | Status: DC
  Administered 2019-07-26: 36 [IU] via SUBCUTANEOUS
  Filled 2019-07-25: qty 0.36

## 2019-07-25 MED ORDER — ONDANSETRON HCL 4 MG/2ML IJ SOLN: 4.0000 mg | Freq: Four times a day (QID) | INTRAMUSCULAR | Status: DC | PRN

## 2019-07-25 MED ORDER — METHOTREXATE 2.5 MG PO TABS: 20.0000 mg | ORAL_TABLET | ORAL | Status: DC

## 2019-07-25 MED ORDER — NALOXONE HCL 0.4 MG/ML IJ SOLN: 0.4000 mg | INTRAMUSCULAR | Status: DC | PRN

## 2019-07-25 MED ORDER — SODIUM CHLORIDE 0.9% FLUSH: 3.0000 mL | INTRAVENOUS | Status: DC | PRN

## 2019-07-25 MED ORDER — MILNACIPRAN HCL 100 MG PO TABS: 100.0000 mg | ORAL_TABLET | Freq: Two times a day (BID) | ORAL | Status: DC

## 2019-07-25 MED ORDER — LIDOCAINE-EPINEPHRINE (PF) 2 %-1:200000 IJ SOLN
INTRAMUSCULAR | Status: AC
  Filled 2019-07-25: qty 20

## 2019-07-25 MED ORDER — ADULT MULTIVITAMIN W/MINERALS CH
1.0000 | ORAL_TABLET | Freq: Every day | ORAL | Status: DC
  Administered 2019-07-26: 1 via ORAL
  Filled 2019-07-25: qty 1

## 2019-07-25 MED ORDER — FUROSEMIDE 20 MG PO TABS: 20.0000 mg | ORAL_TABLET | Freq: Every day | ORAL | Status: DC | PRN

## 2019-07-25 MED ORDER — LIDOCAINE-EPINEPHRINE (PF) 2 %-1:200000 IJ SOLN
INTRAMUSCULAR | Status: AC | PRN
  Administered 2019-07-25: 10 mL

## 2019-07-25 MED ORDER — IOHEXOL 300 MG/ML  SOLN
100.0000 mL | Freq: Once | INTRAMUSCULAR | Status: AC | PRN
  Administered 2019-07-25: 30 mL via INTRA_ARTERIAL

## 2019-07-25 MED ORDER — POTASSIUM CHLORIDE CRYS ER 20 MEQ PO TBCR
20.0000 meq | EXTENDED_RELEASE_TABLET | Freq: Every day | ORAL | Status: DC
  Administered 2019-07-26: 20 meq via ORAL
  Filled 2019-07-25: qty 1

## 2019-07-25 MED ORDER — ROPINIROLE HCL 1 MG PO TABS
1.0000 mg | ORAL_TABLET | ORAL | Status: DC
  Administered 2019-07-26: 1 mg via ORAL
  Filled 2019-07-25: qty 1

## 2019-07-25 MED ORDER — INSULIN ASPART PROT & ASPART (70-30 MIX) 100 UNIT/ML PEN: 30.0000 [IU] | PEN_INJECTOR | Freq: Two times a day (BID) | SUBCUTANEOUS | Status: DC

## 2019-07-25 MED ORDER — CARVEDILOL 6.25 MG PO TABS
6.2500 mg | ORAL_TABLET | Freq: Two times a day (BID) | ORAL | Status: DC
  Administered 2019-07-25 – 2019-07-26 (×2): 6.25 mg via ORAL
  Filled 2019-07-25 (×2): qty 1

## 2019-07-25 MED ORDER — MAGNESIUM GLUCONATE 500 MG PO TABS
250.0000 mg | ORAL_TABLET | Freq: Every day | ORAL | Status: DC | PRN
  Filled 2019-07-25: qty 1

## 2019-07-25 MED ORDER — PIPERACILLIN-TAZOBACTAM 3.375 G IVPB
3.3750 g | Freq: Once | INTRAVENOUS | Status: AC
  Administered 2019-07-25: 15:00:00 3.375 g via INTRAVENOUS

## 2019-07-25 MED ORDER — DIPHENHYDRAMINE HCL 12.5 MG/5ML PO ELIX
12.5000 mg | ORAL_SOLUTION | Freq: Four times a day (QID) | ORAL | Status: DC | PRN
  Filled 2019-07-25: qty 5

## 2019-07-25 MED ORDER — MIDAZOLAM HCL 2 MG/2ML IJ SOLN
INTRAMUSCULAR | Status: AC | PRN
  Administered 2019-07-25 (×2): 0.5 mg via INTRAVENOUS
  Administered 2019-07-25: 1 mg via INTRAVENOUS
  Administered 2019-07-25: 0.5 mg via INTRAVENOUS

## 2019-07-25 MED ORDER — PANTOPRAZOLE SODIUM 40 MG PO TBEC
40.0000 mg | DELAYED_RELEASE_TABLET | Freq: Every day | ORAL | Status: DC
  Administered 2019-07-26: 40 mg via ORAL
  Filled 2019-07-25: qty 1

## 2019-07-25 MED ORDER — SODIUM CHLORIDE 0.9 % IV SOLN
INTRAVENOUS | Status: DC
  Administered 2019-07-25: 14:00:00 via INTRAVENOUS

## 2019-07-25 MED ORDER — FENTANYL CITRATE (PF) 100 MCG/2ML IJ SOLN
INTRAMUSCULAR | Status: AC | PRN
  Administered 2019-07-25: 50 ug via INTRAVENOUS
  Administered 2019-07-25 (×2): 25 ug via INTRAVENOUS

## 2019-07-25 MED ORDER — IOHEXOL 300 MG/ML  SOLN
100.0000 mL | Freq: Once | INTRAMUSCULAR | Status: AC | PRN
  Administered 2019-07-25: 23 mL via INTRA_ARTERIAL

## 2019-07-25 MED ORDER — INSULIN ASPART PROT & ASPART (70-30 MIX) 100 UNIT/ML ~~LOC~~ SUSP
30.0000 [IU] | Freq: Two times a day (BID) | SUBCUTANEOUS | Status: DC
  Administered 2019-07-26: 30 [IU] via SUBCUTANEOUS
  Filled 2019-07-25: qty 10

## 2019-07-25 MED ORDER — SODIUM CHLORIDE 0.9 % IV SOLN: 250.0000 mL | INTRAVENOUS | Status: DC | PRN

## 2019-07-25 NOTE — H&P (Signed)
Chief Complaint: Hepatocellular carcinoma  Referring Physician(s): Katragadda,Sreedhar  Supervising Physician: Sandi Mariscal  Patient Status: Wanda Curry [facility] - Out-pt  History of Present Illness: Wanda Curry is a 73 y.o. female with past medical history significant for colon cancer, rheumatoid arthritis, diabetes and hyperlipidemia.  She was found to have a indeterminate slowly growing lesion within the central aspect of the liver, primarily affecting the caudate lobe initially seen on surveillance CT scan of the abdomen and pelvis performed 10/08/2016 at which time measuring approximately 3.2 x 2.6 cm found to slowly enlarge measuring 4.8 x 3.4 cm on CT of the abdomen pelvis performed 03/29/2019.    She underwent ultrasound-guided liver lesion biopsy on 05/11/2019.  Pathology revealed well to moderately differentiated hepatocellular carcinoma, steatohepatitic variant.  She had a tele-visit consultation with Dr. Pascal Lux on 06/09/2019 and on 07/07/2019 after CT done on 07/02/2019.  Dr. Pascal Lux note from the visit on 07/02/2019 states = I feel bland embolization is a better option for this patient (as opposed to Y-90 embolization) given the significant amount of atherosclerotic plaque including suspected hemodynamically significant narrowings involving the bilateral common femoral arteries as well as the mid aspect of the abdominal aorta. Additionally, there is nonconventional anatomy with separate origins of the common hepatic and splenic arteries in lieu of a typical celiac trunk, rendering the required repeat procedures for Y 90 radioembolization difficult and challenging.  As such, given the technical difficulty with attempted mesenteric arteriogram, I feel a solitary procedure with bland embolization is the most feasible intervention especially considering both the Y 90 radioembolization and bland embolization are performed for palliative purposes. Note, there is preserved patency of the portal  venous system, a necessary requirement for bland embolization.  She is here today for the procedure.  She is NPO. No nausea/vomiting. No Fever/chills. ROS negative.   Past Medical History:  Diagnosis Date  . Anemia   . Arthritis    RA  . Cancer ([place])    skin cancer  . Depression   . Diabetes mellitus without complication ([place])   . Fibromyalgia   . GERD (gastroesophageal reflux disease)   . Heart murmur    ECHO scheduled 05-25-2015  . Hyperlipidemia   . Neuropathy   . PONV (postoperative nausea and vomiting)     Past Surgical History:  Procedure Laterality Date  . ABDOMINAL HYSTERECTOMY    . BIOPSY  09/23/2016   Procedure: BIOPSY;  Surgeon: Danie Binder, MD;  [facility];  Service: Endoscopy;;  hepatic flexure mass  . CHOLECYSTECTOMY    . COLONOSCOPY N/A 08/13/2017   Procedure: COLONOSCOPY;  Surgeon: Daneil Dolin, MD;  [facility];  Service: Endoscopy;  Laterality: N/A;  . COLONOSCOPY WITH PROPOFOL N/A 09/23/2016   Procedure: COLONOSCOPY WITH PROPOFOL;  Surgeon: Danie Binder, MD;  [facility];  Service: Endoscopy;  Laterality: N/A;  7:30 am  . HARDWARE REMOVAL Right 05/30/2015   Procedure: HARDWARE REMOVAL;  Surgeon: Ninetta Lights, MD;  Location: [place];  Service: Orthopedics;  Laterality: Right;  . IR RADIOLOGIST EVAL & MGMT  06/09/2019  . IR RADIOLOGIST EVAL & MGMT  07/07/2019  . PARTIAL COLECTOMY N/A 10/10/2016   Procedure: PARTIAL COLECTOMY;  Surgeon: Aviva Signs, MD;  Location: [facility];  Service: General;  Laterality: N/A;  . planter warts Bilateral    both feet  . POLYPECTOMY  09/23/2016   Procedure: POLYPECTOMY;  Surgeon: Danie Binder, MD;  [facility];  Service:  Endoscopy;;  transverse colon polyps times 2, rectal polyp  . skin grafts     due to planter warts  . TOTAL KNEE ARTHROPLASTY Right 05/30/2015   Procedure: RIGHT TOTAL KNEE ARTHROPLASTY;  Surgeon: Ninetta Lights, MD;  Location: [place];  Service:  Orthopedics;  Laterality: Right;    Allergies: Adhesive [tape], Sulfa antibiotics, and Erythromycin  Medications: Prior to Admission medications   Medication Sig Start Date End Date Taking? Authorizing Provider  acetaminophen ([employer] PAIN RELIEF EXTRA STRENGTH) 500 MG tablet Take 500 mg by mouth every 6 (six) hours as needed (for pain.).    [provider]  atorvastatin (LIPITOR) 20 MG tablet Take 20 mg by mouth every morning.     [provider]  Calcium Carb-Cholecalciferol (CALCIUM + D3) 600-200 MG-UNIT TABS Take 1 tablet by mouth 2 (two) times daily. 07/16/16   [provider]  carvedilol (COREG) 6.25 MG tablet Take 6.25 mg by mouth 2 (two) times daily with a meal.    [provider]  folic acid (FOLVITE) 1 MG tablet Take 1 mg by mouth daily.    [provider]  furosemide (LASIX) 20 MG tablet Take 20 mg by mouth daily as needed for edema.     [provider]  gabapentin (NEURONTIN) 600 MG tablet Take 600 mg by mouth 2 (two) times daily. MAY TAKE 3RD CAPSULE IF NEEDED    [provider]  Insulin Glargine (BASAGLAR KWIKPEN) 100 UNIT/ML SOPN Inject 36 Units into the skin every morning.  12/23/17   [provider]  KLOR-CON M20 20 MEQ tablet Take 20 mEq by mouth daily.  07/28/17   [provider]  loperamide (IMODIUM) 2 MG capsule Take 2 mg by mouth daily.    [provider]  losartan (COZAAR) 25 MG tablet Take 25 mg by mouth daily. 04/20/19   [provider]  Magnesium Gluconate 250 MG TABS Take 250 mg by mouth daily as needed (cramps).     [provider]  methotrexate (RHEUMATREX) 2.5 MG tablet Take 20 mg by mouth every Sunday. Caution:Chemotherapy. Protect from light.     [provider]  Milnacipran HCl (SAVELLA) 100 MG TABS tablet Take 100 mg by mouth 2 (two) times daily.    [provider]  Misc Natural Products (OSTEO BI-FLEX ADV JOINT SHIELD PO) Take by mouth 2 (two)  times daily.    [provider]  Multiple Vitamins-Minerals (CENTRUM SILVER PO) Take 1 tablet by mouth daily.    [provider]  NOVOLOG MIX 70/30 FLEXPEN (70-30) 100 UNIT/ML FlexPen Inject 30 Units into the skin 2 (two) times daily with a meal.  09/14/16   [provider]  omeprazole (PRILOSEC) 40 MG capsule Take 40 mg by mouth daily.  11/17/16   [provider]  rOPINIRole (REQUIP) 1 MG tablet Take 1 mg by mouth every morning. MAY TAKE 2 ADDITIONAL DOSES  AS NEEDED    [provider]  Vitamin D, Ergocalciferol, (DRISDOL) 50000 units CAPS capsule Take 50,000 Units by mouth every Wednesday.    [provider]  XARELTO 10 MG TABS tablet TAKE 1 TABLET BY MOUTH DAILY WITH SUPPER 06/28/19   Lockamy, Randi L, NP-C     Family History  Problem Relation Age of Onset  . Colon cancer Neg Hx     Social History   Socioeconomic History  . Marital status: Widowed    Spouse name: Not on file  . Number of children: Not on  file  . Years of education: Not on file  . Highest education level: Not on file  Occupational History  . Not on file  Social Needs  . Financial resource strain: Not on file  . Food insecurity    Worry: Not on file    Inability: Not on file  . Transportation needs    Medical: Not on file    Non-medical: Not on file  Tobacco Use  . Smoking status: Former Smoker    Packs/day: 1.00    Years: 40.00    Pack years: 40.00    Types: Cigarettes    Quit date: 08/18/2004    Years since quitting: 14.9  . Smokeless tobacco: Never Used  Substance and Sexual Activity  . Alcohol use: No  . Drug use: No  . Sexual activity: Not Currently    Birth control/protection: Surgical  Lifestyle  . Physical activity    Days per week: Not on file    Minutes per session: Not on file  . Stress: Not on file  Relationships  . Social Herbalist on phone: Not on file    Gets together: Not on file    Attends religious service: Not on  file    Active member of club or organization: Not on file    Attends meetings of clubs or organizations: Not on file    Relationship status: Not on file  Other Topics Concern  . Not on file  Social History Narrative  . Not on file     Review of Systems: A 12 point ROS discussed and pertinent positives are indicated in the HPI above.  All other systems are negative.  Review of Systems   Physical Exam Constitutional:      Appearance: Normal appearance.  HENT:     Head: Normocephalic and atraumatic.  Eyes:     Extraocular Movements: Extraocular movements intact.  Neck:     Musculoskeletal: Normal range of motion.  Cardiovascular:     Rate and Rhythm: Normal rate and regular rhythm.  Pulmonary:     Effort: Pulmonary effort is normal.     Breath sounds: Normal breath sounds.  Abdominal:     General: There is no distension.     Palpations: Abdomen is soft.     Tenderness: There is no abdominal tenderness.  Musculoskeletal: Normal range of motion.  Skin:    General: Skin is warm and dry.  Neurological:     General: No focal deficit present.     Mental Status: She is alert.  Psychiatric:        Mood and Affect: Mood normal.        Thought Content: Thought content normal.        Judgment: Judgment normal.     Imaging: Ir Radiologist Eval & Mgmt  Result Date: 07/07/2019 Please refer to notes tab for details about interventional procedure. (Op Note)  Ct Angio Abd/pel W/ And/or W/o  Result Date: 07/02/2019 CLINICAL DATA:  History of colon cancer now with slowly enlarging lesion within the caudate lobe post ultrasound-guided biopsy on 05/11/2027 with pathology compatible with separate hepatocellular primary. Please perform CT of the abdomen pelvis to evaluate hepatic vascular anatomy prior to potential trans catheter therapy. EXAM: CTA ABDOMEN AND PELVIS WITHOUT AND WITH CONTRAST TECHNIQUE: Multidetector CT imaging of the abdomen and pelvis was performed using the standard  protocol during bolus administration of intravenous contrast. Multiplanar reconstructed images and MIPs were obtained and reviewed to evaluate the vascular  anatomy. CONTRAST:  55mL OMNIPAQUE IOHEXOL 350 MG/ML SOLN COMPARISON:  Abdominal MRI-04/21/2019; ultrasound-guided liver mass biopsy-05/11/2019; chest CT-05/31/2019 FINDINGS: VASCULAR Aorta: There is a large amount of irregular predominantly calcified atherosclerotic plaque throughout the normal caliber abdominal aorta. Focal eccentric calcified atherosclerotic plaque involving the mid aspect of the infrarenal abdominal aorta results in short segment approximately 50% luminal narrowing (image 76, series 5). No evidence of abdominal aortic dissection or periaortic stranding. Celiac: There is separate origin of the splenic and common hepatic arteries in lieu of a conventional celiac trunk. The splenic artery gives rise to the left gastric artery. Conventional configuration of the hepatic arterial system. The ill-defined lesion within the caudate appears to be predominantly supplied via the left hepatic artery. SMA: Mildly disease though widely patent without hemodynamically significant narrowing. Conventional branching pattern. The distal tributaries the SMA appear widely patent without discrete intraluminal filling defect suggest distal embolism. Renals: Solitary right-sided renal artery; there are 3 discrete left-sided renal arteries with accessory renal arteries supplying the superior and inferior poles of the left kidney. Both dominant right-sided renal arteries are mildly disease at their origin though widely patent without hemodynamically significant narrowing. No vessel irregularity to suggest FMD. IMA: Widely patent without hemodynamically significant narrowing. Inflow: There is a large amount of irregular predominantly calcified atherosclerotic plaque involving the bilateral common iliac arteries, not definitely resulting in hemodynamically significant  stenosis. The bilateral external and internal iliac arteries are diseased though patent of normal caliber. Proximal Outflow: Eccentric calcified atherosclerotic plaque results in approximately 50% luminal narrowing of the bilateral common femoral arteries (image 76, series 12). Veins: The IVC and pelvic venous systems appear widely patent Review of the MIP images confirms the above findings. _________________________________________________________ NON-VASCULAR Lower chest: Limited visualization of the lower thorax demonstrates minimal dependent subpleural ground-glass atelectasis, right greater than left. No discrete focal airspace opacities. No pleural effusion. Normal heart size.  No pericardial effusion. Hepatobiliary: There is diffuse decreased attenuation of the hepatic parenchyma on this postcontrast examination. This finding is associated with mild nodularity of the hepatic contour. Redemonstrated ill-defined approximately 4.7 x 4.3 x 3.6 cm hypoattenuating mass within the caudate lobe (coronal image 70, series 14; axial image 19, series 12, similar in size and appearance compared to abdominal MRI performed 04/21/2019, previously, 4.9 x 3.6 cm. There is a minimal amount of focal fatty infiltration adjacent to the fissure for the ligamentum teres. No new discrete hepatic lesions. Post cholecystectomy. No intra extrahepatic bili duct dilatation. No ascites. Pancreas: Normal appearance of the pancreas. Spleen: Normal appearance of the spleen. Adrenals/Urinary Tract: There is symmetric enhancement of the bilateral kidneys. No evidence of nephrolithiasis on this postcontrast examination. There is a minimal amount of grossly symmetric likely age and body habitus related bilateral perinephric stranding. No urine obstruction. Normal appearance the bilateral adrenal glands. There is mild diffuse thickening the urinary bladder wall, potentially accentuated due to underdistention. Stomach/Bowel: Scattered colonic  diverticulosis without evidence of superimposed acute diverticulitis. Sequela of prior right hemicolectomy. No evidence of enteric obstruction. No pneumoperitoneum, pneumatosis or portal venous gas. Lymphatic: No bulky retroperitoneal, mesenteric or porta hepatis lymphadenopathy. Reproductive: Post hysterectomy. No discrete adnexal lesion. No free fluid the pelvic cul-de-sac. Other: Subcutaneous stranding about the undersurface of the abdominal pannus may represent the location of subcutaneous medication administration. Tiny mesenteric fat containing periumbilical hernia. Musculoskeletal: No acute or aggressive osseous abnormalities. IMPRESSION: VASCULAR 1. Large amount of atherosclerotic plaque within normal caliber abdominal aorta with short-segment approximately 50% luminal narrowing involving mid aspect of  the infrarenal abdominal aorta. No evidence of abdominal aortic dissection or periaortic stranding. Aortic Atherosclerosis (ICD10-I70.0). 2. Non conventional mesenteric anatomy including separate origins of the common hepatic and splenic arteries arising directly from the abdominal aorta in lieu of a common celiac trunk. 3. Ill-defined lesion within the caudate a appears to be predominantly supplied via the left hepatic artery. 4. Suspected 50% luminal narrowing involving the bilateral common femoral arteries. NON-VASCULAR 1. Ill-defined approximately 4.7 cm mass involving the caudate is grossly unchanged compared to abdominal MRI performed 04/21/2019. No new discrete hepatic lesions. 2. Stable sequela of prior right hemicolectomy without evidence of enteric obstruction. 3. Similar findings suggestive of hepatic steatosis and early cirrhotic change. Electronically Signed   By: Sandi Mariscal M.D.   On: 07/02/2019 08:23    Labs:  CBC: Recent Labs    09/16/18 0914 12/20/18 1108 03/29/19 1409 05/11/19 1103  WBC 14.2* 8.9 11.6* 11.4*  HGB 13.8 12.3 12.7 13.4  HCT 44.1 39.4 40.7 40.7  PLT 258 231 247  255    COAGS: Recent Labs    05/11/19 1103  INR 1.1    BMP: Recent Labs    09/16/18 0914 12/20/18 1108 03/29/19 1344 03/29/19 1409 05/31/19 1658  NA 137 140  --  141  --   K 3.9 4.5  --  4.6  --   CL 100 104  --  105  --   CO2 26 27  --  28  --   GLUCOSE 249* 131*  --  96  --   BUN 33* 17  --  21  --   CALCIUM 9.0 9.0  --  9.1  --   CREATININE 1.00 1.00 1.10* 1.04* 1.50*  GFRNONAA 55* 55*  --  53*  --   GFRAA >60 >60  --  >60  --     LIVER FUNCTION TESTS: Recent Labs    09/16/18 0914 12/20/18 1108 03/29/19 1409  BILITOT 0.9 0.6 0.7  AST 27 25 35  ALT 33 28 34  ALKPHOS 56 53 51  PROT 7.3 6.6 6.9  ALBUMIN 3.8 3.4* 3.6    TUMOR MARKERS: No results for input(s): AFPTM, CEA, CA199, CHROMGRNA in the last 8760 hours.  Assessment and Plan:  Well to moderately differentiated hepatocellular carcinoma.  Will proceed with mesenteric angiography and bland embolization today by Dr. Pascal Lux.  The Risks and benefits of embolization were discussed with the patient including, but not limited to bleeding, infection, vascular injury, post operative pain, or contrast induced renal failure.  This procedure involves the use of X-rays and because of the nature of the planned procedure, it is possible that we will have prolonged use of X-ray fluoroscopy.  Potential radiation risks to you include (but are not limited to) the following: - A slightly elevated risk for cancer several years later in life. This risk is typically less than 0.5% percent. This risk is low in comparison to the normal incidence of human cancer, which is 33% for women and 50% for men according to the [place]. - Radiation induced injury can include skin redness, resembling a rash, tissue breakdown / ulcers and hair loss (which can be temporary or permanent).  The likelihood of either of these occurring depends on the difficulty of the procedure and whether you are sensitive to radiation due  to previous procedures, disease, or genetic conditions.  IF your procedure requires a prolonged use of radiation, you will be notified and given written instructions for further  action. It is your responsibility to monitor the irradiated area for the 2 weeks following the procedure and to notify your physician if you are concerned that you have suffered a radiation induced injury.   All of the patient's questions were answered, patient is agreeable to proceed. Consent signed and in chart.  Thank you for this interesting consult.  I greatly enjoyed meeting Viera Steinle and look forward to participating in their care.  A copy of this report was sent to the requesting provider on this date.  Electronically Signed: Murrell Redden, PA-C   07/25/2019, 1:26 PM      I spent a total of    25 Minutes in face to face in clinical consultation, greater than 50% of which was counseling/coordinating care for bland embolization of liver tumor.

## 2019-07-25 NOTE — Procedures (Signed)
Pre-procedure Diagnosis: HCC Post-procedure Diagnosis: Same  Post bland embolization of hepatic lesion.    Complications: None Immediate  EBL: None  Keep right leg straight for 4 hrs (until 2030).    Signed: Sandi Mariscal [phone] 07/25/2019, 4:39 PM

## 2019-07-25 NOTE — Sedation Documentation (Signed)
CBG 113 taken by Rolan Bucco, RN. Dr Pascal Lux at bedside and aware. Also aware of HR 40s-50s.

## 2019-07-26 DIAGNOSIS — C220 Liver cell carcinoma: Secondary | ICD-10-CM | POA: Diagnosis not present

## 2019-07-26 LAB — GLUCOSE, CAPILLARY
Glucose-Capillary: 113 mg/dL — ABNORMAL HIGH (ref 70–99)
Glucose-Capillary: 226 mg/dL — ABNORMAL HIGH (ref 70–99)
Glucose-Capillary: 176 mg/dL — ABNORMAL HIGH (ref 70–99)

## 2019-07-26 MED ORDER — HYDROCODONE-ACETAMINOPHEN 5-325 MG PO TABS: 1.0000 | ORAL_TABLET | Freq: Four times a day (QID) | ORAL | 0 refills | Status: DC | PRN

## 2019-07-26 NOTE — Discharge Instructions (Signed)
Embolization, Care After This sheet gives you information about how to care for yourself after your procedure. Your health care provider may also give you more specific instructions. If you have problems or questions, contact your health care provider. What can I expect after the procedure? After the procedure, it is common to have pain or bruising at the area where your catheter was inserted (insertion site). You may have other symptoms depending on the part of your body that was treated. For example:  Embolization of the arteries in the brain may cause a headache.  Embolization of the arteries in the stomach may cause loss of appetite or nausea. Follow these instructions at home: Insertion site care   If the site starts to bleed, lie down flat and put pressure on the site. If the bleeding does not stop, get help right away. This is a medical emergency.  Follow instructions from your health care provider about how to take care of your insertion site. Make sure you: ? Wash your hands with soap and water before and after you change your bandage (dressing). If soap and water are not available, use hand sanitizer. ? Change your dressing as told by your health care provider. ? Leave stitches (sutures), skin glue, or adhesive strips in place. These skin closures may need to stay in place for 2 weeks or longer. If adhesive strip edges start to loosen and curl up, you may trim the loose edges. Do not remove adhesive strips completely unless your health care provider tells you to do that.  Keep the insertion site clean. To do this: ? Gently wash the site with plain soap and water. ? Pat the area dry with a clean towel. ? Do not rub the site. This may cause bleeding.  Check your insertion site every day for signs of infection. Check for: ? Redness, swelling, or pain. ? Fluid or blood. ? Warmth. ? Pus or a bad smell.  Do not take baths, swim, or use a hot tub until your health care provider  approves. You may be allowed to shower 24-48 hours after the procedure. Activity   Do not lift anything that is heavier than 10 lb (4.5 kg), or the limit that you are told, until your health care provider says that it is safe.  Do not drive for 24 hours if you were given a sedative during your procedure.  Return to your normal activities as told by your health care provider. Ask your health care provider what activities are safe for you. General instructions   Take over-the-counter and prescription medicines only as told by your health care provider.  Ask your health care provider if the medicine prescribed to you: ? Requires you to avoid driving or using heavy machinery. ? Can cause constipation. You may need to take actions to prevent or treat constipation, such as:  Drink enough fluid to keep your urine pale yellow.  Take over-the-counter or prescription medicines.  Eat foods that are high in fiber, such as beans, whole grains, and fresh fruits and vegetables.  Limit foods that are high in fat and processed sugars, such as fried or sweet foods.  Keep all follow-up visits as told by your health care provider. This is important. Contact a health care provider if:  You have pain that gets worse or does not get better with medicine.  You have a fever.  You have redness, swelling, or pain around your insertion site.  You have fluid or blood coming from  your insertion site.  Your insertion site is warm to the touch.  You have pus or a bad smell coming from your insertion site.  You have nausea or vomiting. Get help right away if:  The insertion area swells very quickly.  The insertion area is bleeding, and the bleeding does not stop after you hold steady pressure on the area.  The area near or just beyond the insertion site becomes pale, cool, tingly, or numb.  You faint or feel like you might faint.  You have chest pain.  You have trouble breathing.  You feel  weak or have trouble moving your arms or legs.  You have problems with balance, speech, or vision. These symptoms may represent a serious problem that is an emergency. Do not wait to see if the symptoms will go away. Get medical help right away. Call your local emergency services (911 in the [place] Do not drive yourself to the hospital. Summary  After your procedure, it is common to have pain or bruising at the area where your catheter was inserted (insertion site).  Do not drive for 24 hours if you were given a sedative during your procedure.  Follow instructions from your health care provider about how to take care of your insertion site.  Contact a health care provider if you have a fever or if you have redness, swelling, or pain around your insertion site.  Keep all follow-up visits as told by your health care provider. This is important. This information is not intended to replace advice given to you by your health care provider. Make sure you discuss any questions you have with your health care provider. Document Released: 08/09/2013 Document Revised: 04/05/2018 Document Reviewed: 04/05/2018 Elsevier Patient Education  2020 [employer].

## 2019-07-26 NOTE — Progress Notes (Signed)
Dilaudid PCA syringe 23cc wawsted with Psychologist, forensic Nurse [place]

## 2019-07-26 NOTE — Care Management Obs Status (Signed)
[facility] NOTIFICATION   Patient Details  Name: Wanda Curry MRN: [id number] Date of Birth: [birth year]   Medicare Observation Status Notification Given:  Yes    Wende Neighbors, LCSW 07/26/2019, 12:07 PM

## 2019-07-26 NOTE — Care Management CC44 (Signed)
Condition Code 44 Documentation Completed  Patient Details  Name: Hardeep Hopping MRN: [id number] Date of Birth: [birth year]   Condition Code 44 given:  Yes Patient signature on Condition Code 44 notice:  Yes Documentation of 2 MD's agreement:  Yes Code 44 added to claim:  Yes    Wende Neighbors, LCSW 07/26/2019, 12:07 PM

## 2019-07-26 NOTE — Plan of Care (Signed)
done

## 2019-07-26 NOTE — Progress Notes (Deleted)
Hypoglycemic Event  CBG: 43  Treatment:  8 oz orange juice Symptoms:  none  Follow-up CBG: Time: CBG Result:  Possible Reasons for Event: Comments/MD notified:    Arletha Pili

## 2019-07-26 NOTE — Discharge Summary (Signed)
Patient ID: Wanda Curry MRN: [id number] DOB/AGE: Dec 07, 1945 73 y.o.  Admit date: 07/25/2019 Discharge date: 07/26/2019  Supervising Physician: Sandi Mariscal  Patient Status: [facility] - In-pt  Admission Diagnoses: Hepatocellular carcinoma  Discharge Diagnoses: Hepatocellular carcinoma, status post bland embolization of infiltrative lesion within the caudate lobe on 07/25/2019 Active Problems:   Hepatocellular carcinoma ([place])   [place] (hepatocellular carcinoma) ([place])  Past Medical History:  Diagnosis Date   Anemia    Arthritis    RA   Cancer ([place])    skin cancer   Depression    Diabetes mellitus without complication (HCC)    Fibromyalgia    GERD (gastroesophageal reflux disease)    Heart murmur    ECHO scheduled 05-25-2015   Hyperlipidemia    Neuropathy    PONV (postoperative nausea and vomiting)    Past Surgical History:  Procedure Laterality Date   ABDOMINAL HYSTERECTOMY     BIOPSY  09/23/2016   Procedure: BIOPSY;  Surgeon: Danie Binder, MD;  [facility];  Service: Endoscopy;;  hepatic flexure mass   CHOLECYSTECTOMY     COLONOSCOPY N/A 08/13/2017   Procedure: COLONOSCOPY;  Surgeon: Daneil Dolin, MD;  [facility];  Service: Endoscopy;  Laterality: N/A;   COLONOSCOPY WITH PROPOFOL N/A 09/23/2016   Procedure: COLONOSCOPY WITH PROPOFOL;  Surgeon: Danie Binder, MD;  [facility];  Service: Endoscopy;  Laterality: N/A;  7:30 am   HARDWARE REMOVAL Right 05/30/2015   Procedure: HARDWARE REMOVAL;  Surgeon: Ninetta Lights, MD;  Location: [place];  Service: Orthopedics;  Laterality: Right;   IR ANGIOGRAM SELECTIVE EACH ADDITIONAL VESSEL  07/25/2019   IR ANGIOGRAM SELECTIVE EACH ADDITIONAL VESSEL  07/25/2019   IR ANGIOGRAM VISCERAL SELECTIVE  07/25/2019   IR ANGIOGRAM VISCERAL SELECTIVE  07/25/2019   IR EMBO TUMOR ORGAN ISCHEMIA INFARCT INC GUIDE ROADMAPPING  07/25/2019   IR RADIOLOGIST EVAL & MGMT  06/09/2019   IR RADIOLOGIST  EVAL & MGMT  07/07/2019   IR US GUIDE VASC ACCESS RIGHT  07/25/2019   PARTIAL COLECTOMY N/A 10/10/2016   Procedure: PARTIAL COLECTOMY;  Surgeon: Aviva Signs, MD;  Location[facility];  Service: General;  Laterality: N/A;   planter warts Bilateral    both feet   POLYPECTOMY  09/23/2016   Procedure: POLYPECTOMY;  Surgeon: Danie Binder, MD;  [facility];  Service: Endoscopy;;  transverse colon polyps times 2, rectal polyp   skin grafts     due to planter warts   TOTAL KNEE ARTHROPLASTY Right 05/30/2015   Procedure: RIGHT TOTAL KNEE ARTHROPLASTY;  Surgeon: Ninetta Lights, MD;  Location: [place];  Service: Orthopedics;  Laterality: Right;     Discharged Condition: good  Hospital Course: Wanda Curry is a 73 year old female with past medical history significant for colon cancer, rheumatoid arthritis, diabetes, prior PE on Xarelto and hyperlipidemia who was found to have an indeterminate slowly growing lesion within the central aspect of the liver/caudate lobe for which she ultimately underwent ultrasound-guided liver biopsy on 05/11/2019 with pathologic diagnosis of well to moderately differentiated hepatocellular carcinoma.  She was seen recently in consultation via telemedicine with Dr. Pascal Lux and deemed an appropriate candidate for bland embolization of the [facility].  She underwent the procedure on 07/25/2019 without immediate complications and was admitted to the hospital for overnight observation.  She was placed on Dilaudid PCA pump.  Overnight she did experience some right upper quadrant/right shoulder discomfort with mild nausea and vomiting.  She  was also given antiemetics as needed.  On the morning of discharge she was stable.  She denied fever, headache, chest pain, dyspnea, cough, worsening abdominal/back pain, nausea, vomiting or bleeding.  She was able to void, tolerate her diet and ambulate without significant difficulty.  Findings were discussed with Dr. Pascal Lux and she was deemed stable  for discharge at this time.  She will resume her usual home medications and resume Xarelto on 12/9.  Electronic prescription called in for Norco 5/325, #30, no refill, 1 to 2 tablets every 4-6 hours as needed for moderate pain.  She will be scheduled for follow-up with Dr. Pascal Lux (likely virtual visit) in 3 to 4 weeks.  She will continue follow-up with Dr. Delton Coombes as needed. She was told to contact our service with any additional questions.  Consults: none  Significant Diagnostic Studies: Results for orders placed or performed during the hospital encounter of 07/25/19  CBC with Differential  Result Value Ref Range   WBC 8.7 4.0 - 10.5 K/uL   RBC 4.06 3.87 - 5.11 MIL/uL   Hemoglobin 12.5 12.0 - 15.0 g/dL   HCT 39.8 36.0 - 46.0 %   MCV 98.0 80.0 - 100.0 fL   MCH 30.8 26.0 - 34.0 pg   MCHC 31.4 30.0 - 36.0 g/dL   RDW 13.6 11.5 - 15.5 %   Platelets 234 150 - 400 K/uL   nRBC 0.0 0.0 - 0.2 %   Neutrophils Relative % 68 %   Neutro Abs 5.9 1.7 - 7.7 K/uL   Lymphocytes Relative 21 %   Lymphs Abs 1.8 0.7 - 4.0 K/uL   Monocytes Relative 9 %   Monocytes Absolute 0.7 0.1 - 1.0 K/uL   Eosinophils Relative 2 %   Eosinophils Absolute 0.2 0.0 - 0.5 K/uL   Basophils Relative 0 %   Basophils Absolute 0.0 0.0 - 0.1 K/uL   Immature Granulocytes 0 %   Abs Immature Granulocytes 0.03 0.00 - 0.07 K/uL  Comprehensive metabolic panel  Result Value Ref Range   Sodium 140 135 - 145 mmol/L   Potassium 4.8 3.5 - 5.1 mmol/L   Chloride 105 98 - 111 mmol/L   CO2 25 22 - 32 mmol/L   Glucose, Bld 177 (H) 70 - 99 mg/dL   BUN 18 8 - 23 mg/dL   Creatinine, Ser 0.96 0.44 - 1.00 mg/dL   Calcium 9.1 8.9 - 10.3 mg/dL   Total Protein 6.2 (L) 6.5 - 8.1 g/dL   Albumin 3.2 (L) 3.5 - 5.0 g/dL   AST 29 15 - 41 U/L   ALT 30 0 - 44 U/L   Alkaline Phosphatase 57 38 - 126 U/L   Total Bilirubin 0.3 0.3 - 1.2 mg/dL   GFR calc non Af Amer 58 (L) >60 mL/min   GFR calc Af Amer >60 >60 mL/min   Anion gap 10 5 - 15    Protime-INR  Result Value Ref Range   Prothrombin Time 12.8 11.4 - 15.2 seconds   INR 1.0 0.8 - 1.2  Glucose, capillary  Result Value Ref Range   Glucose-Capillary 168 (H) 70 - 99 mg/dL  Glucose, capillary  Result Value Ref Range   Glucose-Capillary 166 (H) 70 - 99 mg/dL   Comment 1 Notify RN    Comment 2 Document in Chart   Glucose, capillary  Result Value Ref Range   Glucose-Capillary 226 (H) 70 - 99 mg/dL   Comment 1 Notify RN    Comment 2 Document in Chart  Type and screen  Result Value Ref Range   ABO/RH(D) O POS    Antibody Screen NEG    Sample Expiration      07/28/2019,2359 Performed at [facility], [place] [address], [place], [place] [address]   ABO/Rh  Result Value Ref Range   ABO/RH(D)      O POS Performed at [place] [address] [place], [place] [address]      Treatments: Technically successful particle embolization of the dominant division of the left hepatic artery supplying the ill-defined [place] within the caudate lobe on 07/25/2019 via IV conscious sedation  Discharge Exam: Blood pressure (!) 154/63, pulse (!) 45, temperature 98 F (36.7 C), temperature source Oral, resp. rate 17, height 5\' 5"  (1.651 m), weight 261 lb 3.9 oz (118.5 kg), SpO2 99 %. Latest vitals- BP 159/72  HR 86 R 17 O2 sats 98%  2 liters temp 97.8 Awake, alert.  Chest clear to auscultation bilaterally.  Heart with regular rate and rhythm .  Abdomen soft, positive bowel sounds, mildly tender right upper quadrant to palpation. Bilat LE extremity edema noted.  Puncture site right common femoral artery soft, clean, dry, nontender, no hematoma.  Disposition: Discharge disposition: 01-Home or Self Care       Discharge Instructions    Call MD for:  difficulty breathing, headache or visual disturbances   Complete by: As directed    Call MD for:  extreme fatigue   Complete by: As directed    Call MD for:  hives   Complete by: As directed     Call MD for:  persistant dizziness or light-headedness   Complete by: As directed    Call MD for:  persistant nausea and vomiting   Complete by: As directed    Call MD for:  redness, tenderness, or signs of infection (pain, swelling, redness, odor or green/yellow discharge around incision site)   Complete by: As directed    Call MD for:  severe uncontrolled pain   Complete by: As directed    Call MD for:  temperature >100.4   Complete by: As directed    Change dressing (specify)   Complete by: As directed    May change bandage over right groin daily and apply Band-Aid to site for the next 2 to 3 days.  May wash site with soap and water.   Diet - low sodium heart healthy   Complete by: As directed    Discharge instructions   Complete by: As directed    May resume Xarelto on 12/9.  Stay well-hydrated.  Do not take Tylenol and Vicodin together.   Increase activity slowly   Complete by: As directed    Lifting restrictions   Complete by: As directed    No heavy lifting for the next 3 to 4 days   May shower / Bathe   Complete by: As directed    May walk up steps   Complete by: As directed      Allergies as of 07/26/2019      Reactions   Adhesive [tape]    Adhesive tape and band-aids cause skin irritation   Sulfa Antibiotics Nausea Only, Other (See Comments)   Joint paint   Erythromycin Itching, Rash   burning      Medication List    TAKE these medications   Basaglar KwikPen 100 UNIT/ML Sopn Inject 36 Units into the skin daily.   Calcium + D3 600-200 MG-UNIT Tabs Take 1 tablet  by mouth 2 (two) times daily.   carvedilol 6.25 MG tablet Commonly known as: COREG Take 6.25 mg by mouth 2 (two) times daily with a meal.   CENTRUM SILVER PO Take 1 tablet by mouth daily.   [employer] Pain Relief Extra Strength 500 MG tablet Generic drug: acetaminophen Take 500 mg by mouth every 6 (six) hours as needed (for pain.).   folic acid 1 MG tablet Commonly known as: FOLVITE Take 1 mg by  mouth daily.   furosemide 40 MG tablet Commonly known as: LASIX Take 40 mg by mouth daily.   gabapentin 600 MG tablet Commonly known as: NEURONTIN Take 600 mg by mouth 2 (two) times daily as needed (nerve pain).   HYDROcodone-acetaminophen 5-325 MG tablet Commonly known as: Norco Take 1 tablet by mouth every 6 (six) hours as needed for moderate pain.   Klor-Con M20 20 MEQ tablet Generic drug: potassium chloride SA Take 20 mEq by mouth daily.   loperamide 2 MG capsule Commonly known as: IMODIUM Take 2 mg by mouth daily.   losartan 25 MG tablet Commonly known as: COZAAR Take 25 mg by mouth daily.   Magnesium Gluconate 250 MG Tabs Take 250 mg by mouth daily as needed (cramps).   methotrexate 2.5 MG tablet Commonly known as: RHEUMATREX Take 20 mg by mouth every Sunday. Caution:Chemotherapy. Protect from light.   NovoLOG Mix 70/30 FlexPen (70-30) 100 UNIT/ML FlexPen Generic drug: insulin aspart protamine - aspart Inject 20-30 Units into the skin 2 (two) times daily with a meal. Take 30 units in the am and Take 20 units in the evening   omeprazole 40 MG capsule Commonly known as: PRILOSEC Take 40 mg by mouth daily.   OSTEO BI-FLEX ADV JOINT SHIELD PO Take 1 tablet by mouth daily.   rOPINIRole 1 MG tablet Commonly known as: REQUIP Take 1 mg by mouth every morning. MAY TAKE 2 ADDITIONAL DOSES  AS NEEDED   Savella 100 MG Tabs tablet Generic drug: Milnacipran HCl Take 100 mg by mouth 2 (two) times daily.   Xarelto 10 MG Tabs tablet Generic drug: rivaroxaban TAKE 1 TABLET BY MOUTH DAILY WITH SUPPER What changed: See the new instructions.            Discharge Care Instructions  (From admission, onward)         Start     Ordered   07/26/19 0000  Change dressing (specify)    Comments: May change bandage over right groin daily and apply Band-Aid to site for the next 2 to 3 days.  May wash site with soap and water.   07/26/19 1118         Follow-up  Information    Sandi Mariscal, MD Follow up.   Specialties: Interventional Radiology, Radiology Why: Dr. Pascal Lux will schedule follow-up with you in 3 to 4 weeks. Please call [phone] or [phone] with any questions.  Contact information: [place] [address] [place] [address] [phone]        Derek Jack, MD Follow up.   Specialty: Hematology Why: Follow-up with Dr. Delton Coombes as scheduled Contact information: [address] [place] [address] [phone]            Electronically Signed: D. Rowe Robert, PA-C 07/26/2019, 11:22 AM   I have spent Less Than 30 Minutes discharging Wanda Curry.

## 2019-08-16 ENCOUNTER — Other Ambulatory Visit: Payer: Self-pay | Admitting: Interventional Radiology

## 2019-08-16 DIAGNOSIS — C220 Liver cell carcinoma: Secondary | ICD-10-CM

## 2019-08-22 ENCOUNTER — Other Ambulatory Visit (HOSPITAL_COMMUNITY): Payer: Medicare Other

## 2019-08-26 ENCOUNTER — Ambulatory Visit (HOSPITAL_COMMUNITY): Payer: Medicare Other

## 2019-08-26 ENCOUNTER — Other Ambulatory Visit (HOSPITAL_COMMUNITY): Payer: Medicare Other

## 2019-08-29 ENCOUNTER — Ambulatory Visit (HOSPITAL_COMMUNITY): Payer: Medicare Other | Admitting: Hematology

## 2019-08-31 ENCOUNTER — Inpatient Hospital Stay (HOSPITAL_COMMUNITY): Payer: Medicare Other

## 2019-08-31 ENCOUNTER — Ambulatory Visit (HOSPITAL_COMMUNITY): Payer: Medicare Other

## 2019-09-06 ENCOUNTER — Ambulatory Visit (HOSPITAL_COMMUNITY): Payer: Medicare Other | Admitting: Hematology

## 2019-09-06 ENCOUNTER — Other Ambulatory Visit: Payer: Self-pay

## 2019-09-06 ENCOUNTER — Ambulatory Visit: Payer: Medicare Other | Attending: Internal Medicine

## 2019-09-06 DIAGNOSIS — Z20822 Contact with and (suspected) exposure to covid-19: Secondary | ICD-10-CM

## 2019-09-07 LAB — NOVEL CORONAVIRUS, NAA: SARS-CoV-2, NAA: DETECTED — AB

## 2019-09-13 ENCOUNTER — Telehealth: Payer: Medicare Other

## 2019-09-14 ENCOUNTER — Telehealth: Payer: Medicare Other

## 2019-12-19 ENCOUNTER — Other Ambulatory Visit: Payer: Self-pay

## 2019-12-19 DIAGNOSIS — C220 Liver cell carcinoma: Secondary | ICD-10-CM

## 2019-12-21 ENCOUNTER — Other Ambulatory Visit: Payer: Self-pay | Admitting: Interventional Radiology

## 2019-12-21 DIAGNOSIS — C220 Liver cell carcinoma: Secondary | ICD-10-CM

## 2019-12-26 ENCOUNTER — Other Ambulatory Visit (HOSPITAL_COMMUNITY)
Admission: RE | Admit: 2019-12-26 | Discharge: 2019-12-26 | Disposition: A | Discharge status: Home / Self Care | Payer: Medicare Other | Source: Ambulatory Visit | Attending: Interventional Radiology | Admitting: Interventional Radiology

## 2019-12-26 ENCOUNTER — Other Ambulatory Visit: Payer: Self-pay

## 2019-12-26 DIAGNOSIS — C220 Liver cell carcinoma: Secondary | ICD-10-CM | POA: Diagnosis present

## 2019-12-26 LAB — COMPREHENSIVE METABOLIC PANEL
ALT: 28 U/L (ref 0–44)
AST: 21 U/L (ref 15–41)
Albumin: 3.6 g/dL (ref 3.5–5.0)
Alkaline Phosphatase: 46 U/L (ref 38–126)
Anion gap: 8 (ref 5–15)
BUN: 23 mg/dL (ref 8–23)
CO2: 29 mmol/L (ref 22–32)
Calcium: 9 mg/dL (ref 8.9–10.3)
Chloride: 102 mmol/L (ref 98–111)
Creatinine, Ser: 1 mg/dL (ref 0.44–1.00)
GFR calc Af Amer: >60 mL/min (ref >60)
GFR calc non Af Amer: 55 mL/min — ABNORMAL LOW (ref >60)
Glucose, Bld: 113 mg/dL — ABNORMAL HIGH (ref 70–99)
Potassium: 3.8 mmol/L (ref 3.5–5.1)
Sodium: 139 mmol/L (ref 135–145)
Total Bilirubin: 0.4 mg/dL (ref 0.3–1.2)
Total Protein: 6.8 g/dL (ref 6.5–8.1)

## 2019-12-27 ENCOUNTER — Ambulatory Visit (HOSPITAL_COMMUNITY)
Admission: RE | Admit: 2019-12-27 | Discharge: 2019-12-27 | Disposition: A | Discharge status: Home / Self Care | Payer: Medicare Other | Source: Ambulatory Visit | Attending: Interventional Radiology | Admitting: Interventional Radiology

## 2019-12-27 DIAGNOSIS — C220 Liver cell carcinoma: Secondary | ICD-10-CM | POA: Insufficient documentation

## 2019-12-27 MED ORDER — GADOBUTROL 1 MMOL/ML IV SOLN
10.0000 mL | Freq: Once | INTRAVENOUS | Status: AC | PRN
  Administered 2019-12-27: 09:00:00 10 mL via INTRAVENOUS

## 2020-01-05 ENCOUNTER — Other Ambulatory Visit: Payer: Self-pay

## 2020-01-05 ENCOUNTER — Ambulatory Visit
Admission: RE | Admit: 2020-01-05 | Discharge: 2020-01-05 | Disposition: A | Discharge status: Home / Self Care | Payer: Medicare Other | Source: Ambulatory Visit | Attending: Interventional Radiology | Admitting: Interventional Radiology

## 2020-01-05 ENCOUNTER — Encounter: Payer: Self-pay | Admitting: *Deleted

## 2020-01-05 DIAGNOSIS — C220 Liver cell carcinoma: Secondary | ICD-10-CM

## 2020-01-05 HISTORY — PX: IR RADIOLOGIST EVAL & MGMT: IMG5224

## 2020-01-05 NOTE — Progress Notes (Signed)
Patient ID: Wanda Curry, female   DOB: May 19, 1946, 74 y.o.   MRN: [id number]        Chief Complaint: Hepatocellular carcinoma  Referring Physician(s): Delton Coombes  History of Present Illness:  Wanda Curry is a 74 y.o. female with past medical history significant for colon cancer, rheumatoid arthritis, diabetes and hyperlipidemia who was found to have an indeterminate slowly growing lesion within the central aspect of the liver for which he ultimately underwent ultrasound-guided liver biopsy on 05/11/2019 with pathologic diagnosis of well to moderately differentiated hepatocellular carcinoma.     The patient was initially seen in consultation on 06/09/2019 where she was deemed a candidate for potential bland hepatic embolization and again on 07/07/2019  following acquisition of planning CTA of the abdomen and pelvis.    The patient ultimately underwent a successful bland hepatic embolization performed 07/24/2020.  She is seen today in telemedicine consultation following acquisition of initial postprocedural abdominal MRI performed 12/27/2019.  The patient's daughter accompanies the patient on today's telemedicine consultation and serves as the primary historian due to patient's difficulty hearing.  The patient remains in her baseline state of fair health.  No change in patient's baseline chronic fatigue.  The patient remains independent with most ADLs but requires assistance for outside the home.  No worsening abdominal pain. No change in appetite.  No unintentional weight loss or weight gain. no yellowing of the skin or eyes.   Past Medical History:  Diagnosis Date  . Anemia   . Arthritis    RA  . Cancer ([place])    skin cancer  . Depression   . Diabetes mellitus without complication ([place])   . Fibromyalgia   . GERD (gastroesophageal reflux disease)   . Heart murmur    ECHO scheduled 05-25-2015  . Hyperlipidemia   . Neuropathy   . PONV (postoperative nausea and vomiting)      Past Surgical History:  Procedure Laterality Date  . ABDOMINAL HYSTERECTOMY    . BIOPSY  09/23/2016   Procedure: BIOPSY;  Surgeon: Danie Binder, MD;  [facility];  Service: Endoscopy;;  hepatic flexure mass  . CHOLECYSTECTOMY    . COLONOSCOPY N/A 08/13/2017   Procedure: COLONOSCOPY;  Surgeon: Daneil Dolin, MD;  [facility];  Service: Endoscopy;  Laterality: N/A;  . COLONOSCOPY WITH PROPOFOL N/A 09/23/2016   Procedure: COLONOSCOPY WITH PROPOFOL;  Surgeon: Danie Binder, MD;  [facility];  Service: Endoscopy;  Laterality: N/A;  7:30 am  . HARDWARE REMOVAL Right 05/30/2015   Procedure: HARDWARE REMOVAL;  Surgeon: Ninetta Lights, MD;  Location: [facility];  Service: Orthopedics;  Laterality: Right;  . IR ANGIOGRAM SELECTIVE EACH ADDITIONAL VESSEL  07/25/2019  . IR ANGIOGRAM SELECTIVE EACH ADDITIONAL VESSEL  07/25/2019  . IR ANGIOGRAM VISCERAL SELECTIVE  07/25/2019  . IR ANGIOGRAM VISCERAL SELECTIVE  07/25/2019  . IR EMBO TUMOR ORGAN ISCHEMIA INFARCT INC GUIDE ROADMAPPING  07/25/2019  . IR RADIOLOGIST EVAL & MGMT  06/09/2019  . IR RADIOLOGIST EVAL & MGMT  07/07/2019  . IR US GUIDE VASC ACCESS RIGHT  07/25/2019  . PARTIAL COLECTOMY N/A 10/10/2016   Procedure: PARTIAL COLECTOMY;  Surgeon: Aviva Signs, MD;  Location: [facility];  Service: General;  Laterality: N/A;  . planter warts Bilateral    both feet  . POLYPECTOMY  09/23/2016   Procedure: POLYPECTOMY;  Surgeon: Danie Binder, MD;  [facility];  Service: Endoscopy;;  transverse colon polyps times 2, rectal polyp  .  skin grafts     due to planter warts  . TOTAL KNEE ARTHROPLASTY Right 05/30/2015   Procedure: RIGHT TOTAL KNEE ARTHROPLASTY;  Surgeon: Ninetta Lights, MD;  Location: [facility];  Service: Orthopedics;  Laterality: Right;    Allergies: Adhesive [tape], Sulfa antibiotics, and Erythromycin  Medications: Prior to Admission medications   Medication Sig Start Date End Date Taking? Authorizing  Provider  acetaminophen ([employer] PAIN RELIEF EXTRA STRENGTH) 500 MG tablet Take 500 mg by mouth every 6 (six) hours as needed (for pain.).    [provider]  Calcium Carb-Cholecalciferol (CALCIUM + D3) 600-200 MG-UNIT TABS Take 1 tablet by mouth 2 (two) times daily. 07/16/16   [provider]  carvedilol (COREG) 6.25 MG tablet Take 6.25 mg by mouth 2 (two) times daily with a meal.    [provider]  folic acid (FOLVITE) 1 MG tablet Take 1 mg by mouth daily.    [provider]  furosemide (LASIX) 40 MG tablet Take 40 mg by mouth daily. 07/24/19   [provider]  gabapentin (NEURONTIN) 600 MG tablet Take 600 mg by mouth 2 (two) times daily as needed (nerve pain).     [provider]  HYDROcodone-acetaminophen (NORCO) 5-325 MG tablet Take 1 tablet by mouth every 6 (six) hours as needed for moderate pain. 07/26/19   Allred, Darrell K, PA-C  Insulin Glargine (BASAGLAR KWIKPEN) 100 UNIT/ML SOPN Inject 36 Units into the skin daily.  12/23/17   [provider]  KLOR-CON M20 20 MEQ tablet Take 20 mEq by mouth daily.  07/28/17   [provider]  loperamide (IMODIUM) 2 MG capsule Take 2 mg by mouth daily.    [provider]  losartan (COZAAR) 25 MG tablet Take 25 mg by mouth daily. 04/20/19   [provider]  Magnesium Gluconate 250 MG TABS Take 250 mg by mouth daily as needed (cramps).     [provider]  methotrexate (RHEUMATREX) 2.5 MG tablet Take 20 mg by mouth every Sunday. Caution:Chemotherapy. Protect from light.     [provider]  Milnacipran HCl (SAVELLA) 100 MG TABS tablet Take 100 mg by mouth 2 (two) times daily.    [provider]  Misc Natural Products (OSTEO BI-FLEX ADV JOINT SHIELD PO) Take 1 tablet by mouth daily.     [provider]  Multiple Vitamins-Minerals (CENTRUM SILVER PO) Take 1 tablet by mouth daily.    [provider]  NOVOLOG MIX 70/30 FLEXPEN (70-30)  100 UNIT/ML FlexPen Inject 20-30 Units into the skin 2 (two) times daily with a meal. Take 30 units in the am and Take 20 units in the evening 09/14/16   [provider]  omeprazole (PRILOSEC) 40 MG capsule Take 40 mg by mouth daily.  11/17/16   [provider]  rOPINIRole (REQUIP) 1 MG tablet Take 1 mg by mouth every morning. MAY TAKE 2 ADDITIONAL DOSES  AS NEEDED    [provider]  XARELTO 10 MG TABS tablet TAKE 1 TABLET BY MOUTH DAILY WITH SUPPER Patient taking differently: Take 10 mg by mouth daily.  06/28/19   Glennie Isle, NP-C     Family History  Problem Relation Age of Onset  . Colon cancer Neg Hx     Social History   Socioeconomic History  . Marital status: Widowed    Spouse name: Not on file  . Number of children: Not on file  . Years of education: Not on file  .  Highest education level: Not on file  Occupational History  . Not on file  Tobacco Use  . Smoking status: Former Smoker    Packs/day: 1.00    Years: 40.00    Pack years: 40.00    Types: Cigarettes    Quit date: 08/18/2004    Years since quitting: 15.3  . Smokeless tobacco: Never Used  Substance and Sexual Activity  . Alcohol use: No  . Drug use: No  . Sexual activity: Not Currently    Birth control/protection: Surgical  Other Topics Concern  . Not on file  Social History Narrative  . Not on file   Social Determinants of Health   Financial Resource Strain:   . Difficulty of Paying Living Expenses:   Food Insecurity:   . Worried About Charity fundraiser in the Last Year:   . Arboriculturist in the Last Year:   Transportation Needs:   . Film/video editor (Medical):   Marland Kitchen Lack of Transportation (Non-Medical):   Physical Activity:   . Days of Exercise per Week:   . Minutes of Exercise per Session:   Stress:   . Feeling of Stress :   Social Connections:   . Frequency of Communication with Friends and Family:   . Frequency of Social Gatherings with Friends and  Family:   . Attends Religious Services:   . Active Member of Clubs or Organizations:   . Attends Archivist Meetings:   Marland Kitchen Marital Status:     ECOG Status: 1 - Symptomatic but completely ambulatory  Review of Systems  Review of Systems: A 12 point ROS discussed and pertinent positives are indicated in the HPI above.  All other systems are negative.  Physical Exam No direct physical exam was performed (except for noted visual exam findings with Video Visits).   Vital Signs: There were no vitals taken for this visit.  Imaging:  Following examinations were reviewed:  - Abdominal MRI -12/27/2019; 04/21/2019 - CT abdomen and pelvis -07/02/2019 -Fluoroscopic guided bland hepatic embolization-07/25/2019  MR ABDOMEN WWO CONTRAST  Result Date: 12/27/2019 CLINICAL DATA:  Hepatocellular carcinoma. History of colon cancer in 2008 with partial colectomy. Prior chemotherapy. EXAM: MRI ABDOMEN WITHOUT AND WITH CONTRAST TECHNIQUE: Multiplanar multisequence MR imaging of the abdomen was performed both before and after the administration of intravenous contrast. CONTRAST:  65mL GADAVIST GADOBUTROL 1 MMOL/ML IV SOLN COMPARISON:  CT scan 07/01/2019 and prior MRI from 04/21/2019 FINDINGS: Despite efforts by the technologist and patient, extensive motion artifact is present on today's exam and could not be eliminated. This reduces exam sensitivity and specificity. Lower chest: Unremarkable Hepatobiliary: The central right hepatic lobe lesion near the base of the caudate has low T1 signal characteristics and faintly accentuated T2 signal characteristics with initial irregular marginal enhancement on early arterial phase images such as image 19/13, with subsequent fill in of contrast on later phase images especially centrally with loss of the peripheral halo of enhancement. Reportedly this is biopsy proven hepatocellular carcinoma and the patient has undergone interval bland embolization. The lesion  measures approximately 2.6 by 1.8 by 2.2 cm (volume = 5.4 cm^3) today, and on prior MRI measured approximately 4.9 by 3.6 by 5.0 cm (volume = 46 cm^3). Accordingly there has been a significant reduction in size of this lesion. I do not observe a new liver lesion. Pancreas:  Unremarkable Spleen:  Unremarkable Adrenals/Urinary Tract:  Unremarkable Stomach/Bowel: Unremarkable Vascular/Lymphatic: Atherosclerosis. No supplemental non-categorized findings. Other:  No supplemental non-categorized findings.  Musculoskeletal: Mild lumbar spondylosis. IMPRESSION: 1. Considerable reduction in the volume of the biopsy-proven hepatocellular carcinoma the base of the caudate lobe, currently 5.4 cubic cm in volume and previously 46 cubic cm in volume. There is still some accentuated enhancement centrally within this lesion particularly on delayed images, which could be from scarring, but imaging surveillance is recommended. 2. Atherosclerosis. 3. Despite efforts by the technologist and patient, extensive motion artifact is present on today's exam and could not be eliminated. This reduces exam sensitivity and specificity. Electronically Signed   By: Van Clines M.D.   On: 12/27/2019 14:32    Labs:  CBC: Recent Labs    03/29/19 1409 05/11/19 1103 07/25/19 1342  WBC 11.6* 11.4* 8.7  HGB 12.7 13.4 12.5  HCT 40.7 40.7 39.8  PLT 247 255 234    COAGS: Recent Labs    05/11/19 1103 07/25/19 1342  INR 1.1 1.0    BMP: Recent Labs    03/29/19 1409 05/31/19 1658 07/25/19 1342 12/26/19 1008  NA 141  --  140 139  K 4.6  --  4.8 3.8  CL 105  --  105 102  CO2 28  --  25 29  GLUCOSE 96  --  177* 113*  BUN 21  --  18 23  CALCIUM 9.1  --  9.1 9.0  CREATININE 1.04* 1.50* 0.96 1.00  GFRNONAA 53*  --  58* 55*  GFRAA >60  --  >60 >60    LIVER FUNCTION TESTS: Recent Labs    03/29/19 1409 07/25/19 1342 12/26/19 1008  BILITOT 0.7 0.3 0.4  AST 35 29 21  ALT 34 30 28  ALKPHOS 51 57 46  PROT 6.9 6.2*  6.8  ALBUMIN 3.6 3.2* 3.6    TUMOR MARKERS:  AFP - 3.9 (04/25/2020)  Assessment and Plan:  Kylisha Buchman is a 74 y.o. female with past medical history significant for colon cancer, rheumatoid arthritis, diabetes and hyperlipidemia who was found to have an indeterminate slowly growing lesion within the central aspect of the liver for which he ultimately underwent ultrasound-guided liver biopsy on 05/11/2019 with pathologic diagnosis of well to moderately differentiated hepatocellular carcinoma for which she underwent a successful bland hepatic embolization performed 07/24/2020. She is seen today in telemedicine consultation following acquisition of initial postprocedural abdominal MRI performed 12/27/2019.  Fortunately, there is preservation of the patient's normal LFTs since undergoing the hepatic embolization.  Following examinations were reviewed:  - Abdominal MRI - 12/27/2019; 04/21/2019 - CT abdomen and pelvis - 07/02/2019 - Fluoroscopic guided bland hepatic embolization - 07/25/2019  Preprocedural abdominal MRI performed 04/21/2019 demonstrates an approximately 4.9 x 3.6 cm ill-defined infiltrative lesion within the central liver, significantly decreased in size on postprocedural abdominal MRI performed 12/27/2019, currently measuring 2.6 x 2.2 cm.  Additionally, there are no new sites of abnormal hepatic enhancement to suggest multifocal hepatocellular carcinoma.  Despite the reduction in size of the ill-defined infiltrative lesion, its location within the central aspect of the liver again precludes her candidacy for microwave ablation given concern for potential biliary injury/stricture.  As such, the patient will be continued to be followed with q. 3 months surveillance abdominal MRIs.  If this lesion were to demonstrate growth and/or increased enhancement, we could undergo repeat bland embolization as indicated.   The patient will be seen in consultation following acquisition of next  surveillance scan in 3 months (mid August) as well as the acquisition of a postprocedural AFP (previously 3.9 on 04/25/2020)   The patient  knows to call the interventional radiology clinic with any interval questions or concerns.   PLAN: - Telemedicine consultation in 3 months following acquisition of AFP level and next surveillance abdominal MRI (mid August 2021).  Thank you for this interesting consult.  I greatly enjoyed meeting Kassiah Zeitz and look forward to participating in their care.  A copy of this report was sent to the requesting provider on this date.  Electronically Signed: Sandi Mariscal 01/05/2020, 10:27 AM   I spent a total of 15 Minutes in remote  clinical consultation, greater than 50% of which was counseling/coordinating care for post hepatic bland embolization for biopsy-proven hepatocellular carcinoma.    Visit type: Audio only (telephone). Audio (no video) only due to patient's lack of internet/smartphone capability. Alternative for in-person consultation at [facility], [address] [place], [place], [place]. This visit type was conducted due to national recommendations for restrictions regarding the COVID-19 Pandemic (e.g. social distancing).  This format is felt to be most appropriate for this patient at this time.  All issues noted in this document were discussed and addressed.

## 2020-03-01 ENCOUNTER — Other Ambulatory Visit: Payer: Self-pay | Admitting: Interventional Radiology

## 2020-03-01 DIAGNOSIS — C220 Liver cell carcinoma: Secondary | ICD-10-CM

## 2020-03-02 ENCOUNTER — Other Ambulatory Visit: Payer: Self-pay

## 2020-03-02 DIAGNOSIS — C220 Liver cell carcinoma: Secondary | ICD-10-CM

## 2020-04-02 ENCOUNTER — Other Ambulatory Visit (HOSPITAL_COMMUNITY)
Admission: RE | Admit: 2020-04-02 | Discharge: 2020-04-02 | Disposition: A | Discharge status: Home / Self Care | Payer: Medicare Other | Source: Ambulatory Visit | Attending: Interventional Radiology | Admitting: Interventional Radiology

## 2020-04-02 ENCOUNTER — Other Ambulatory Visit: Payer: Self-pay

## 2020-04-02 DIAGNOSIS — C220 Liver cell carcinoma: Secondary | ICD-10-CM | POA: Diagnosis present

## 2020-04-02 LAB — COMPREHENSIVE METABOLIC PANEL
ALT: 30 U/L (ref 0–44)
AST: 29 U/L (ref 15–41)
Albumin: 3.8 g/dL (ref 3.5–5.0)
Alkaline Phosphatase: 46 U/L (ref 38–126)
Anion gap: 9 (ref 5–15)
BUN: 20 mg/dL (ref 8–23)
CO2: 26 mmol/L (ref 22–32)
Calcium: 9.5 mg/dL (ref 8.9–10.3)
Chloride: 104 mmol/L (ref 98–111)
Creatinine, Ser: 1.14 mg/dL — ABNORMAL HIGH (ref 0.44–1.00)
GFR calc Af Amer: 54 mL/min — ABNORMAL LOW (ref >60)
GFR calc non Af Amer: 47 mL/min — ABNORMAL LOW (ref >60)
Glucose, Bld: 198 mg/dL — ABNORMAL HIGH (ref 70–99)
Potassium: 4.8 mmol/L (ref 3.5–5.1)
Sodium: 139 mmol/L (ref 135–145)
Total Bilirubin: 0.6 mg/dL (ref 0.3–1.2)
Total Protein: 6.8 g/dL (ref 6.5–8.1)

## 2020-04-03 LAB — AFP TUMOR MARKER: AFP, Serum, Tumor Marker: 11.5 ng/mL — ABNORMAL HIGH (ref 0.0–8.3)

## 2020-04-06 ENCOUNTER — Other Ambulatory Visit: Payer: Self-pay

## 2020-04-06 ENCOUNTER — Ambulatory Visit (HOSPITAL_COMMUNITY)
Admission: RE | Admit: 2020-04-06 | Discharge: 2020-04-06 | Disposition: A | Discharge status: Home / Self Care | Payer: Medicare Other | Source: Ambulatory Visit | Attending: Interventional Radiology | Admitting: Interventional Radiology

## 2020-04-06 DIAGNOSIS — I700 Atherosclerosis of aorta: Secondary | ICD-10-CM | POA: Diagnosis not present

## 2020-04-06 DIAGNOSIS — C220 Liver cell carcinoma: Secondary | ICD-10-CM | POA: Insufficient documentation

## 2020-04-06 MED ORDER — GADOXETATE DISODIUM 0.25 MMOL/ML IV SOLN
9.0000 mL | Freq: Once | INTRAVENOUS | Status: AC | PRN
  Administered 2020-04-06: 9 mL via INTRAVENOUS

## 2020-04-12 ENCOUNTER — Other Ambulatory Visit: Payer: Self-pay

## 2020-04-12 ENCOUNTER — Ambulatory Visit
Admission: RE | Admit: 2020-04-12 | Discharge: 2020-04-12 | Disposition: A | Discharge status: Home / Self Care | Payer: Medicare Other | Source: Ambulatory Visit | Attending: Interventional Radiology | Admitting: Interventional Radiology

## 2020-04-12 DIAGNOSIS — C220 Liver cell carcinoma: Secondary | ICD-10-CM

## 2020-04-12 HISTORY — PX: IR RADIOLOGIST EVAL & MGMT: IMG5224

## 2020-04-12 NOTE — Progress Notes (Signed)
Patient ID: Ceriah Curry, female   DOB: April 06, 1946, 73 y.o.   MRN: [id number]         Chief Complaint: Hepatocellular carcinoma  Referring Physician(s): Katragadda  History of Present Illness:  Wanda Curry a 74 y.o.femalewith past medical history significant for colon cancer, rheumatoid arthritis, diabetes and hyperlipidemia who was found to have an indeterminate slowly growing lesion within the central aspect of the liver for which he ultimately underwent ultrasound-guided liver biopsy on 05/11/2019 with pathologic diagnosis of well to moderately differentiated hepatocellular carcinoma.  The patient was initially seen in consultation on 06/09/2019 where she was deemed a candidate for potential bland hepatic embolization and again on 07/07/2019 following acquisition of planning CTA of the abdomen and pelvis.   The patient ultimately underwent a successful bland hepatic embolization performed 07/24/2020.  Patient is seen today in telemedicine consultation following acquisition of most recent postprocedural abdominal MRI performed 04/06/2020.  The patient's daughter Wanda Curry accompanies the patient on today's telemedicine consultation.  Patient is currently without complaint.  Specifically, no change in appetite or energy level. No worsening abdominal pain. No change in appetite. No unintentional weight loss or weight gain.no yellowing of the skin or eyes.  Past Medical History:  Diagnosis Date  . Anemia   . Arthritis    RA  . Cancer ([place])    skin cancer  . Depression   . Diabetes mellitus without complication ([place])   . Fibromyalgia   . GERD (gastroesophageal reflux disease)   . Heart murmur    ECHO scheduled 05-25-2015  . Hyperlipidemia   . Neuropathy   . PONV (postoperative nausea and vomiting)     Past Surgical History:  Procedure Laterality Date  . ABDOMINAL HYSTERECTOMY    . BIOPSY  09/23/2016   Procedure: BIOPSY;  Surgeon: Danie Binder, MD;   [facility];  Service: Endoscopy;;  hepatic flexure mass  . CHOLECYSTECTOMY    . COLONOSCOPY N/A 08/13/2017   Procedure: COLONOSCOPY;  Surgeon: Daneil Dolin, MD;  [facility];  Service: Endoscopy;  Laterality: N/A;  . COLONOSCOPY WITH PROPOFOL N/A 09/23/2016   Procedure: COLONOSCOPY WITH PROPOFOL;  Surgeon: Danie Binder, MD;  [facility];  Service: Endoscopy;  Laterality: N/A;  7:30 am  . HARDWARE REMOVAL Right 05/30/2015   Procedure: HARDWARE REMOVAL;  Surgeon: Ninetta Lights, MD;  Location: [place];  Service: Orthopedics;  Laterality: Right;  . IR ANGIOGRAM SELECTIVE EACH ADDITIONAL VESSEL  07/25/2019  . IR ANGIOGRAM SELECTIVE EACH ADDITIONAL VESSEL  07/25/2019  . IR ANGIOGRAM VISCERAL SELECTIVE  07/25/2019  . IR ANGIOGRAM VISCERAL SELECTIVE  07/25/2019  . IR EMBO TUMOR ORGAN ISCHEMIA INFARCT INC GUIDE ROADMAPPING  07/25/2019  . IR RADIOLOGIST EVAL & MGMT  06/09/2019  . IR RADIOLOGIST EVAL & MGMT  07/07/2019  . IR RADIOLOGIST EVAL & MGMT  01/05/2020  . IR US GUIDE VASC ACCESS RIGHT  07/25/2019  . PARTIAL COLECTOMY N/A 10/10/2016   Procedure: PARTIAL COLECTOMY;  Surgeon: Aviva Signs, MD;  Location: [facility];  Service: General;  Laterality: N/A;  . planter warts Bilateral    both feet  . POLYPECTOMY  09/23/2016   Procedure: POLYPECTOMY;  Surgeon: Danie Binder, MD;  [facility];  Service: Endoscopy;;  transverse colon polyps times 2, rectal polyp  . skin grafts     due to planter warts  . TOTAL KNEE ARTHROPLASTY Right 05/30/2015   Procedure: RIGHT TOTAL KNEE ARTHROPLASTY;  Surgeon: Ninetta Lights,  MD;  Location: [place];  Service: Orthopedics;  Laterality: Right;    Allergies: Adhesive [tape], Sulfa antibiotics, and Erythromycin  Medications: Prior to Admission medications   Medication Sig Start Date End Date Taking? Authorizing Provider  acetaminophen ([employer] PAIN RELIEF EXTRA STRENGTH) 500 MG tablet Take 500 mg by mouth every 6 (six) hours as  needed (for pain.).    [provider]  Calcium Carb-Cholecalciferol (CALCIUM + D3) 600-200 MG-UNIT TABS Take 1 tablet by mouth 2 (two) times daily. 07/16/16   [provider]  carvedilol (COREG) 6.25 MG tablet Take 6.25 mg by mouth 2 (two) times daily with a meal.    [provider]  folic acid (FOLVITE) 1 MG tablet Take 1 mg by mouth daily.    [provider]  furosemide (LASIX) 40 MG tablet Take 40 mg by mouth daily. 07/24/19   [provider]  gabapentin (NEURONTIN) 600 MG tablet Take 600 mg by mouth 2 (two) times daily as needed (nerve pain).     [provider]  HYDROcodone-acetaminophen (NORCO) 5-325 MG tablet Take 1 tablet by mouth every 6 (six) hours as needed for moderate pain. 07/26/19   Allred, Darrell K, PA-C  Insulin Glargine (BASAGLAR KWIKPEN) 100 UNIT/ML SOPN Inject 36 Units into the skin daily.  12/23/17   [provider]  KLOR-CON M20 20 MEQ tablet Take 20 mEq by mouth daily.  07/28/17   [provider]  loperamide (IMODIUM) 2 MG capsule Take 2 mg by mouth daily.    [provider]  losartan (COZAAR) 25 MG tablet Take 25 mg by mouth daily. 04/20/19   [provider]  Magnesium Gluconate 250 MG TABS Take 250 mg by mouth daily as needed (cramps).     [provider]  methotrexate (RHEUMATREX) 2.5 MG tablet Take 20 mg by mouth every Sunday. Caution:Chemotherapy. Protect from light.     [provider]  Milnacipran HCl (SAVELLA) 100 MG TABS tablet Take 100 mg by mouth 2 (two) times daily.    [provider]  Misc Natural Products (OSTEO BI-FLEX ADV JOINT SHIELD PO) Take 1 tablet by mouth daily.     [provider]  Multiple Vitamins-Minerals (CENTRUM SILVER PO) Take 1 tablet by mouth daily.    [provider]  NOVOLOG MIX 70/30 FLEXPEN (70-30) 100 UNIT/ML FlexPen Inject 20-30 Units into the skin 2 (two) times daily with a meal. Take 30 units in the am and Take  20 units in the evening 09/14/16   [provider]  omeprazole (PRILOSEC) 40 MG capsule Take 40 mg by mouth daily.  11/17/16   [provider]  rOPINIRole (REQUIP) 1 MG tablet Take 1 mg by mouth every morning. MAY TAKE 2 ADDITIONAL DOSES  AS NEEDED    [provider]  XARELTO 10 MG TABS tablet TAKE 1 TABLET BY MOUTH DAILY WITH SUPPER Patient taking differently: Take 10 mg by mouth daily.  06/28/19   Glennie Isle, NP-C     Family History  Problem Relation Age of Onset  . Colon cancer Neg Hx     Social History   Socioeconomic History  . Marital status: Widowed    Spouse name: Not on file  . Number of children: Not on file  . Years of education: Not on file  . Highest education level: Not on file  Occupational History  . Not on file  Tobacco Use  . Smoking status: Former Smoker    Packs/day: 1.00  Years: 40.00    Pack years: 40.00    Types: Cigarettes    Quit date: 08/18/2004    Years since quitting: 15.6  . Smokeless tobacco: Never Used  Vaping Use  . Vaping Use: Never used  Substance and Sexual Activity  . Alcohol use: No  . Drug use: No  . Sexual activity: Not Currently    Birth control/protection: Surgical  Other Topics Concern  . Not on file  Social History Narrative  . Not on file   Social Determinants of Health   Financial Resource Strain:   . Difficulty of Paying Living Expenses: Not on file  Food Insecurity:   . Worried About Charity fundraiser in the Last Year: Not on file  . Ran Out of Food in the Last Year: Not on file  Transportation Needs:   . Lack of Transportation (Medical): Not on file  . Lack of Transportation (Non-Medical): Not on file  Physical Activity:   . Days of Exercise per Week: Not on file  . Minutes of Exercise per Session: Not on file  Stress:   . Feeling of Stress : Not on file  Social Connections:   . Frequency of Communication with Friends and Family: Not on file  . Frequency of Social Gatherings  with Friends and Family: Not on file  . Attends Religious Services: Not on file  . Active Member of Clubs or Organizations: Not on file  . Attends Archivist Meetings: Not on file  . Marital Status: Not on file    ECOG Status: 1 - Symptomatic but completely ambulatory  Review of Systems  Review of Systems: A 12 point ROS discussed and pertinent positives are indicated in the HPI above.  All other systems are negative.  Physical Exam No direct physical exam was performed (except for noted visual exam findings with Video Visits).   Vital Signs: There were no vitals taken for this visit.  Imaging:  Abdominal MRI-04/06/2020; 12/27/2019; 04/21/2019; CTA abdomen and pelvis-06/22/2019; bland hepatic embolization-07/25/2019.  Personal review of abdominal MRI performed 04/06/2020 demonstrates grossly unchanged appearance of previously biopsied lesion within the central aspect of the liver again measuring approximately 2.4 x 2.1 cm without change in enhancement.  The portal vein remains patent.     MR ABDOMEN WWO CONTRAST  Result Date: 04/06/2020 CLINICAL DATA:  Follow-up hepatocellular carcinoma. Follow-up of embolization in the hepatic lesion. The EXAM: MRI ABDOMEN WITHOUT AND WITH CONTRAST TECHNIQUE: Multiplanar multisequence MR imaging of the abdomen was performed both before and after the administration of intravenous contrast. CONTRAST:  58mL EOVIST GADOXETATE DISODIUM 0.25 MOL/L IV SOLN COMPARISON:  Comparison study from May of 2021 FINDINGS: Lower chest: Lung bases are clear to the extent evaluated on MRI. Hepatobiliary: Lesion in the central liver, RIGHT hepatic lobe bordering the caudate is approximately 2.4 x 2.1 cm on today's exam, previously approximately 2.4 x 2.1 cm. No new, suspicious hepatic lesion. Portal vein is patent. Hepatic veins are patent. Pancreas: Pancreas without ductal dilation, inflammation or focal lesion. Spleen:  Spleen normal size and contour. Adrenals/Urinary  Tract:  Adrenal glands are normal. Kidneys enhance symmetrically.  No hydronephrosis. Stomach/Bowel: Gastrointestinal tract is normal to the extent evaluated. Pelvic bowel loops are not imaged in their entirety in study was not performed for bowel evaluation. Vascular/Lymphatic: No adenopathy. No aneurysmal dilation of the abdominal aorta. Atheromatous plaque of the abdominal aorta without change. Other:  No ascites. Musculoskeletal: No suspicious bone lesions identified. IMPRESSION: Stable central hepatic lesion without  change in enhancement characteristics or visible change in size since May of 2021. No new or suspicious lesion. Aortic Atherosclerosis (ICD10-I70.0). Electronically Signed   By: Zetta Bills M.D.   On: 04/06/2020 15:04    Labs:  CBC: Recent Labs    05/11/19 1103 07/25/19 1342  WBC 11.4* 8.7  HGB 13.4 12.5  HCT 40.7 39.8  PLT 255 234    COAGS: Recent Labs    05/11/19 1103 07/25/19 1342  INR 1.1 1.0    BMP: Recent Labs    05/31/19 1658 07/25/19 1342 12/26/19 1008 04/02/20 0909  NA  --  140 139 139  K  --  4.8 3.8 4.8  CL  --  105 102 104  CO2  --  25 29 26   GLUCOSE  --  177* 113* 198*  BUN  --  18 23 20   CALCIUM  --  9.1 9.0 9.5  CREATININE 1.50* 0.96 1.00 1.14*  GFRNONAA  --  58* 55* 47*  GFRAA  --  >60 >60 54*    LIVER FUNCTION TESTS: Recent Labs    07/25/19 1342 12/26/19 1008 04/02/20 0909  BILITOT 0.3 0.4 0.6  AST 29 21 29   ALT 30 28 30   ALKPHOS 57 46 46  PROT 6.2* 6.8 6.8  ALBUMIN 3.2* 3.6 3.8    TUMOR MARKERS: AFP - 11.5 on 04/02/2020; 3.9 on 04/26/2019   Assessment and Plan:  Wanda Curry a 74 y.o.femalewith past medical history significant for colon cancer, rheumatoid arthritis, diabetes and hyperlipidemia who was found to have an indeterminate slowly growing lesion within the central aspect of the liver for which he ultimately underwent ultrasound-guided liver biopsy on 05/11/2019 with pathologic diagnosis of well to  moderately differentiated hepatocellular carcinoma.  The patient was initially seen in consultation on 06/09/2019 where she was deemed a candidate for potential bland hepatic embolization and again on 07/07/2019 following acquisition of planning CTA of the abdomen and pelvis.   The patient ultimately underwent a successful bland hepatic embolization performed 07/25/2019.  Patient is seen today in telemedicine consultation following acquisition of most recent postprocedural abdominal MRI performed 04/06/2020.  Personal review of abdominal MRI performed 04/06/2020 demonstrates grossly unchanged appearance of previously biopsied lesion within the central aspect of the liver again measuring approximately 2.4 x 2.1 cm without change in enhancement, decrease in size compared to preprocedural CTA performed 07/01/2019, previously, 4.3 cm in maximal diameter.  The portal vein remains patent.  While MR appearance appears stable, there has been mild elevation of the patient's AFP level, measuring 11.5 on 04/03/2019 compared to 3.9 on 04/26/2019  Above discussed in great detail with the patient and the patient's daughter.  Note, the patient remains a candidate for repeat bland embolization preservation of patency of the portal vein as well as normal LFTs and creatinine level and while the patient's AFP level is mildly elevated, the imaging appearance remains stable and given the indolent nature of the lesion (present since abdominal MRI performed 10/28/2016) the decision was made to pursue continued active surveillance and lieu of repeat dedicated intervention at this time.  The patient and the patient's daughter demonstrated excellent understanding of the above and are in agreement with the proposed plan of care.  They know to call the interventional radiology clinic with any interval questions or concerns.  Plan: - Telemedicine consultation following acquisition of surveillance abdominal MRI in 3 months  (late November/early December 2021) and AFP level.  A copy of this report was sent to the  requesting provider on this date.  Electronically Signed: Sandi Mariscal 04/12/2020, 10:27 AM   I spent a total of 10 Minutes in remote  clinical consultation, greater than 50% of which was counseling/coordinating care for [facility], post bland embolization.    Visit type: Audio only (telephone). Audio (no video) only due to patient's lack of internet/smartphone capability. Alternative for in-person consultation at [facility], [address] [place], [place], [place]. This visit type was conducted due to national recommendations for restrictions regarding the COVID-19 Pandemic (e.g. social distancing).  This format is felt to be most appropriate for this patient at this time.  All issues noted in this document were discussed and addressed.

## 2020-06-06 ENCOUNTER — Other Ambulatory Visit: Payer: Self-pay | Admitting: Interventional Radiology

## 2020-06-06 ENCOUNTER — Other Ambulatory Visit: Payer: Self-pay

## 2020-06-06 DIAGNOSIS — C220 Liver cell carcinoma: Secondary | ICD-10-CM

## 2020-09-05 ENCOUNTER — Other Ambulatory Visit: Payer: Self-pay

## 2020-09-05 DIAGNOSIS — C220 Liver cell carcinoma: Secondary | ICD-10-CM

## 2020-09-19 ENCOUNTER — Other Ambulatory Visit: Payer: Self-pay

## 2020-09-19 ENCOUNTER — Other Ambulatory Visit (HOSPITAL_COMMUNITY)
Admission: RE | Admit: 2020-09-19 | Discharge: 2020-09-19 | Disposition: A | Discharge status: Home / Self Care | Payer: Medicare Other | Source: Ambulatory Visit | Attending: Interventional Radiology | Admitting: Interventional Radiology

## 2020-09-19 DIAGNOSIS — C220 Liver cell carcinoma: Secondary | ICD-10-CM | POA: Insufficient documentation

## 2020-09-19 LAB — BUN: BUN: 18 mg/dL (ref 8–23)

## 2020-09-20 LAB — AFP TUMOR MARKER: AFP, Serum, Tumor Marker: 137 ng/mL — ABNORMAL HIGH (ref 0.0–8.3)

## 2020-09-21 ENCOUNTER — Other Ambulatory Visit: Payer: Self-pay

## 2020-09-21 ENCOUNTER — Ambulatory Visit (HOSPITAL_COMMUNITY)
Admission: RE | Admit: 2020-09-21 | Discharge: 2020-09-21 | Disposition: A | Discharge status: Home / Self Care | Payer: Medicare Other | Source: Ambulatory Visit | Attending: Interventional Radiology | Admitting: Interventional Radiology

## 2020-09-21 DIAGNOSIS — C220 Liver cell carcinoma: Secondary | ICD-10-CM | POA: Diagnosis not present

## 2020-09-21 MED ORDER — GADOBUTROL 1 MMOL/ML IV SOLN
10.0000 mL | Freq: Once | INTRAVENOUS | Status: AC | PRN
  Administered 2020-09-21: 10 mL via INTRAVENOUS

## 2020-09-25 IMAGING — US IR EMBO TUMOR ORGAN ISCHEMIA INFARCT INC GUIDE ROADMAPPING
1 series · 1 of 1 positions shown · non-contrast
Comparison: CT abdomen and pelvis - 07/02/2019

INDICATION: History of biopsy-proven hepatocellular carcinoma. Patient presents
today for bland embolization of infiltrative lesion within the
caudate. Please refer to formal consultation in the [REDACTED] EMR dated
07/07/2019 and 06/09/2019 for additional details.

EXAM:
1. ULTRASOUND GUIDANCE FOR ARTERIAL ACCESS
2. SUPERIOR MESENTERIC ARTERIOGRAM (1st ORDER)
3. SELECTIVE COMMON HEPATIC ARTERIOGRAM
4. SELECTIVE PROPER HEPATIC ARTERIOGRAM
5. SELECTIVE LEFT HEPATIC ARTERIOGRAM AND PERCUTANEOUS PARTICLE
EMBOLIZATION

[Series 1: (id) · 1 of 1 slices shown]
[im 1/1]
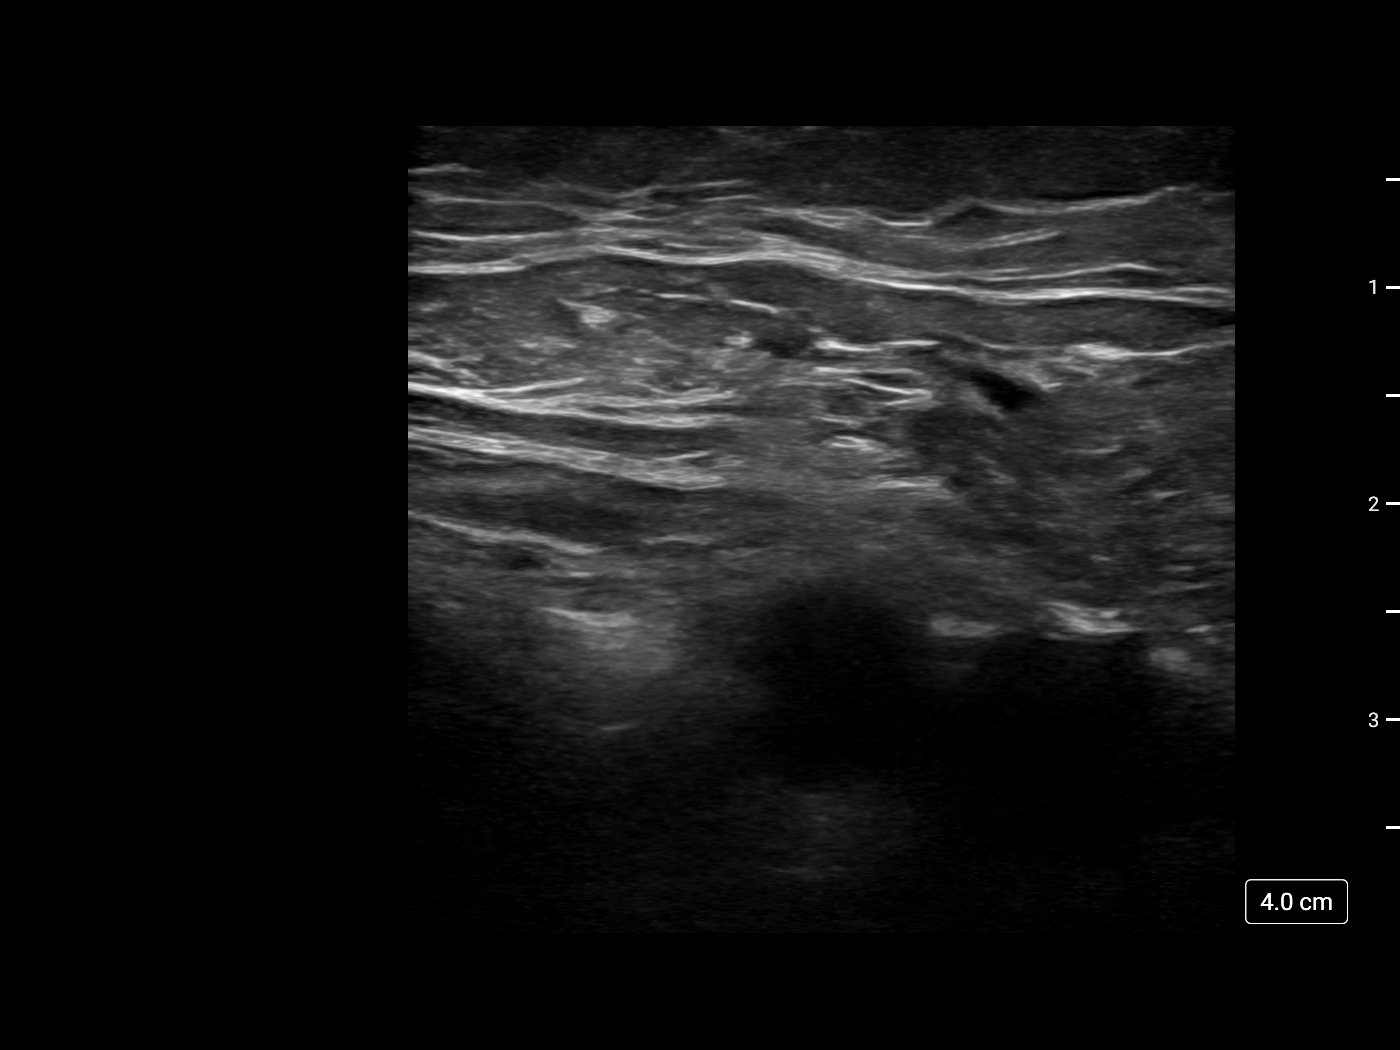

[1 of 1 positions shown; findings below may reference images not displayed]

MEDICATIONS:
Zosyn 3.375 g IV. The antibiotic was administered within one hour of
the procedure

ANESTHESIA/SEDATION:
Moderate (conscious) sedation was employed during this procedure. A
total of Versed 2.5 mg and Fentanyl 100 mcg was administered
intravenously.

Moderate Sedation Time: 50 minutes. The patient's level of
consciousness and vital signs were monitored continuously by
radiology nursing throughout the procedure under my direct
supervision.

CONTRAST:  60 cc Omnipaque 300

FLUOROSCOPY TIME:  16 minutes, 18 seconds (1,790 mGy)

COMPLICATIONS:
None immediate.



The right femoral head was marked fluoroscopically. Under sterile
conditions and local anesthesia, the right common femoral artery
access was performed with a micropuncture needle. Under direct
ultrasound guidance, the right common femoral was accessed with a
micropuncture kit. An ultrasound image was saved for documentation
purposes. This allowed for placement of a 5-French vascular sheath.
A limited arteriogram was performed through the side arm of the
sheath confirming appropriate access within the right common femoral
artery.

With the use of a Bentson wire, a 5 French Mickelson catheter was
advanced through the abdominal aorta with special attention paid to
the known approximately 50% luminal narrowing within mid aspect of
the abdominal aorta. Ultimately Mickelson catheter was advanced to
the level of thoracic aorta where it was formed, back bled and
flushed.

Mickelson catheter was then utilized to select the superior
mesenteric artery and a selective superior mesenteric arteriogram
was performed with delayed portal venous imaging

Next, the Mickelson catheter was utilized to select the proper
hepatic artery. Note, the proper hepatic artery arises directly from
the abdominal aorta in lieu of a conventional celiac trunk as
demonstrated preceding CT of the abdomen pelvis performed
07/02/2019. Selective proper hepatic arteriogram was performed.

With the use of a fathom 14 microwire, a high-flow Progreat
microcatheter was advanced to the level of the common hepatic artery
and a selective common hepatic arteriogram was performed

Next, the microcatheter was advanced to the left hepatic artery and
a selective left hepatic arteriogram was performed.

The microcatheter was advanced beyond its proximal bifurcation, a
selective left hepatic arteriogram was performed and the vessel was
percutaneously embolized with approximately 1 vial of 100-300 micron
Embospheres under direct fluoroscopic guidance.

The microcatheter was flushed and withdrawn to the level of the
proximal left hepatic artery and a post embolization left hepatic
arteriogram was performed.

The microcatheter was then withdrawn to the level of both the proper
and common hepatic arteries and selective arteriograms were
performed.

Images were reviewed and the procedure was terminated.

All wires, catheters and sheaths were removed the patient.
Hemostasis was achieved at the right groin access site with manual
compression. A dressing was applied. The patient tolerated the
procedure well without immediate postprocedural complication.
FINDINGS: Selective superior mesenteric arteriogram is negative for definitive
accessory or replaced hepatic arterial supply. Delayed portal venous
imaging demonstrates patency of the main, right and left portal
veins.

Selective proper hepatic artery demonstrates conventional anatomy.
Note, the proper hepatic artery arises directly from the abdominal
aorta in lieu of a conventional celiac trunk as demonstrated
preceding CT of the abdomen pelvis performed 07/02/2019.

Selective left hepatic arteriogram demonstrates predominant arterial
supply to the infiltrative lesion within the caudate is negative for
definitive extra hepatic arterial supply.

Following left hepatic particle embolization, completion arteriogram
demonstrates technically excellent result with markedly reduced flow
through the dominant division of the left hepatic artery supplying
the ill-defined lesion within the caudate.
IMPRESSION: Technically successful particle embolization of the dominant
division of the left hepatic artery supplying the ill-defined lesion
within the caudate.

PLAN:
The patient will be seen in follow-up consultation at the
[REDACTED] in approximately 3-4 weeks.

## 2020-09-27 ENCOUNTER — Ambulatory Visit
Admission: RE | Admit: 2020-09-27 | Discharge: 2020-09-27 | Disposition: A | Discharge status: Home / Self Care | Payer: Medicare Other | Source: Ambulatory Visit | Attending: Interventional Radiology | Admitting: Interventional Radiology

## 2020-09-27 ENCOUNTER — Other Ambulatory Visit: Payer: Self-pay | Admitting: Interventional Radiology

## 2020-09-27 ENCOUNTER — Other Ambulatory Visit: Payer: Self-pay

## 2020-09-27 DIAGNOSIS — C220 Liver cell carcinoma: Secondary | ICD-10-CM

## 2020-09-27 HISTORY — PX: IR RADIOLOGIST EVAL & MGMT: IMG5224

## 2020-09-27 NOTE — Progress Notes (Signed)
Patient ID: Wanda Curry, female   DOB: March 01, 1946, 75 y.o.   MRN: [id number]         Chief Complaint: [facility]  Referring Physician(s): Delton Coombes  History of Present Illness:  Wanda Curry a 75 y.o.femalewith past medical history significant for colon cancer, rheumatoid arthritis, diabetes and hyperlipidemia who was found to have an indeterminate slowly growing lesion within the central aspect of the liver for which he ultimately underwent ultrasound-guided liver biopsy on 05/11/2019 with pathologic diagnosis of well to moderately differentiated hepatocellular carcinoma, ultimately undergoing a successfulblandhepatic embolization on 07/24/2020.  Patient is seen today in telemedicine consultation following acquisition of most recent postprocedural abdominal MRI performed 09/21/2020.  The patient's daughter Wanda Curry accompanies the patient on today's telemedicine consultation.  Patient is currently without complaint.  Specifically, no change in appetite or energy level. No worsening abdominal pain. No change in appetite. No unintentional weight loss or weight gain.no yellowing of the skin or eyes.   Past Medical History:  Diagnosis Date  . Anemia   . Arthritis    RA  . Cancer ([place])    skin cancer  . Depression   . Diabetes mellitus without complication ([place])   . Fibromyalgia   . GERD (gastroesophageal reflux disease)   . Heart murmur    ECHO scheduled 05-25-2015  . Hyperlipidemia   . Neuropathy   . PONV (postoperative nausea and vomiting)     Past Surgical History:  Procedure Laterality Date  . ABDOMINAL HYSTERECTOMY    . BIOPSY  09/23/2016   Procedure: BIOPSY;  Surgeon: Danie Binder, MD;  [facility];  Service: Endoscopy;;  hepatic flexure mass  . CHOLECYSTECTOMY    . COLONOSCOPY N/A 08/13/2017   Procedure: COLONOSCOPY;  Surgeon: Daneil Dolin, MD;  [facility];  Service: Endoscopy;  Laterality: N/A;  . COLONOSCOPY WITH PROPOFOL N/A  09/23/2016   Procedure: COLONOSCOPY WITH PROPOFOL;  Surgeon: Danie Binder, MD;  [facility];  Service: Endoscopy;  Laterality: N/A;  7:30 am  . HARDWARE REMOVAL Right 05/30/2015   Procedure: HARDWARE REMOVAL;  Surgeon: Ninetta Lights, MD;  Location: [place];  Service: Orthopedics;  Laterality: Right;  . IR ANGIOGRAM SELECTIVE EACH ADDITIONAL VESSEL  07/25/2019  . IR ANGIOGRAM SELECTIVE EACH ADDITIONAL VESSEL  07/25/2019  . IR ANGIOGRAM VISCERAL SELECTIVE  07/25/2019  . IR ANGIOGRAM VISCERAL SELECTIVE  07/25/2019  . IR EMBO TUMOR ORGAN ISCHEMIA INFARCT INC GUIDE ROADMAPPING  07/25/2019  . IR RADIOLOGIST EVAL & MGMT  06/09/2019  . IR RADIOLOGIST EVAL & MGMT  07/07/2019  . IR RADIOLOGIST EVAL & MGMT  01/05/2020  . IR RADIOLOGIST EVAL & MGMT  04/12/2020  . IR US GUIDE VASC ACCESS RIGHT  07/25/2019  . PARTIAL COLECTOMY N/A 10/10/2016   Procedure: PARTIAL COLECTOMY;  Surgeon: Aviva Signs, MD;  Location: [facility];  Service: General;  Laterality: N/A;  . planter warts Bilateral    both feet  . POLYPECTOMY  09/23/2016   Procedure: POLYPECTOMY;  Surgeon: Danie Binder, MD;  [facility];  Service: Endoscopy;;  transverse colon polyps times 2, rectal polyp  . skin grafts     due to planter warts  . TOTAL KNEE ARTHROPLASTY Right 05/30/2015   Procedure: RIGHT TOTAL KNEE ARTHROPLASTY;  Surgeon: Ninetta Lights, MD;  Location: [place];  Service: Orthopedics;  Laterality: Right;    Allergies: Adhesive [tape], Sulfa antibiotics, and Erythromycin  Medications: Prior to Admission medications   Medication Sig Start  Date End Date Taking? Authorizing Provider  acetaminophen ([employer] PAIN RELIEF EXTRA STRENGTH) 500 MG tablet Take 500 mg by mouth every 6 (six) hours as needed (for pain.).    [provider]  Calcium Carb-Cholecalciferol (CALCIUM + D3) 600-200 MG-UNIT TABS Take 1 tablet by mouth 2 (two) times daily. 07/16/16   [provider]  carvedilol (COREG) 6.25 MG tablet Take  6.25 mg by mouth 2 (two) times daily with a meal.    [provider]  folic acid (FOLVITE) 1 MG tablet Take 1 mg by mouth daily.    [provider]  furosemide (LASIX) 40 MG tablet Take 40 mg by mouth daily. 07/24/19   [provider]  gabapentin (NEURONTIN) 600 MG tablet Take 600 mg by mouth 2 (two) times daily as needed (nerve pain).     [provider]  HYDROcodone-acetaminophen (NORCO) 5-325 MG tablet Take 1 tablet by mouth every 6 (six) hours as needed for moderate pain. 07/26/19   Allred, Darrell K, PA-C  Insulin Glargine (BASAGLAR KWIKPEN) 100 UNIT/ML SOPN Inject 36 Units into the skin daily.  12/23/17   [provider]  KLOR-CON M20 20 MEQ tablet Take 20 mEq by mouth daily.  07/28/17   [provider]  loperamide (IMODIUM) 2 MG capsule Take 2 mg by mouth daily.    [provider]  losartan (COZAAR) 25 MG tablet Take 25 mg by mouth daily. 04/20/19   [provider]  Magnesium Gluconate 250 MG TABS Take 250 mg by mouth daily as needed (cramps).     [provider]  methotrexate (RHEUMATREX) 2.5 MG tablet Take 20 mg by mouth every Sunday. Caution:Chemotherapy. Protect from light.     [provider]  Milnacipran HCl (SAVELLA) 100 MG TABS tablet Take 100 mg by mouth 2 (two) times daily.    [provider]  Misc Natural Products (OSTEO BI-FLEX ADV JOINT SHIELD PO) Take 1 tablet by mouth daily.     [provider]  Multiple Vitamins-Minerals (CENTRUM SILVER PO) Take 1 tablet by mouth daily.    [provider]  NOVOLOG MIX 70/30 FLEXPEN (70-30) 100 UNIT/ML FlexPen Inject 20-30 Units into the skin 2 (two) times daily with a meal. Take 30 units in the am and Take 20 units in the evening 09/14/16   [provider]  omeprazole (PRILOSEC) 40 MG capsule Take 40 mg by mouth daily.  11/17/16   [provider]  rOPINIRole (REQUIP) 1 MG tablet Take 1 mg by mouth every morning. MAY TAKE  2 ADDITIONAL DOSES  AS NEEDED    [provider]  XARELTO 10 MG TABS tablet TAKE 1 TABLET BY MOUTH DAILY WITH SUPPER Patient taking differently: Take 10 mg by mouth daily.  06/28/19   Glennie Isle, NP-C     Family History  Problem Relation Age of Onset  . Colon cancer Neg Hx     Social History   Socioeconomic History  . Marital status: Widowed    Spouse name: Not on file  . Number of children: Not on file  . Years of education: Not on file  . Highest education level: Not on file  Occupational History  . Not on file  Tobacco Use  . Smoking status: Former Smoker    Packs/day: 1.00    Years: 40.00    Pack years: 40.00    Types: Cigarettes    Quit date: 08/18/2004    Years since quitting: 16.1  . Smokeless tobacco:  Never Used  Vaping Use  . Vaping Use: Never used  Substance and Sexual Activity  . Alcohol use: No  . Drug use: No  . Sexual activity: Not Currently    Birth control/protection: Surgical  Other Topics Concern  . Not on file  Social History Narrative  . Not on file   Social Determinants of Health   Financial Resource Strain: Not on file  Food Insecurity: Not on file  Transportation Needs: Not on file  Physical Activity: Not on file  Stress: Not on file  Social Connections: Not on file    ECOG Status: 1 - Symptomatic but completely ambulatory  Review of Systems  Review of Systems: A 12 point ROS discussed and pertinent positives are indicated in the HPI above.  All other systems are negative.  Physical Exam No direct physical exam was performed (except for noted visual exam findings with Video Visits).   Vital Signs: There were no vitals taken for this visit.  Imaging:  Abdominal MRI - 09/21/2020; 04/06/2020; 12/27/2019; 04/21/2019 CTA abdomen and pelvis - 06/22/2019 Bland hepatic embolization - 07/25/2019 Chest CT - 05/31/2019  Personal review of abdominal MRI performed 09/21/2020 demonstrates interval increase in size of the now  approximately 4.2 x 3.1 cm lesion within the caudate lobe, previously, 3.1 x 2.7 cm, and now with thickened nodular peripheral enhancement.  Additionally, there has been development of an  approximately 1.3 cm lesion within the left lobe of the liver (image 31, series 17) and tiny subcentimeter lesions within the subcapsular aspect of the lateral segment of the left lobe of the liver (image 31, series 17) and caudal aspect of the right lobe of the liver (image 56, series 17).   The portal vein remains patent.  MR ABDOMEN WWO CONTRAST  Result Date: 09/21/2020 CLINICAL DATA:  75 year old female with history of liver cancer status post embolization procedure last year. Follow-up study. EXAM: MRI ABDOMEN WITHOUT AND WITH CONTRAST TECHNIQUE: Multiplanar multisequence MR imaging of the abdomen was performed both before and after the administration of intravenous contrast. CONTRAST:  60mL GADAVIST GADOBUTROL 1 MMOL/ML IV SOLN COMPARISON:  Abdominal MRI 04/06/2020. FINDINGS: Lower chest: Unremarkable. Hepatobiliary: Previously treated lesion in the central aspect of the liver appears more prominent than prior examinations. This is low T1 signal intensity, slightly T2 hyperintense, demonstrates diffuse diffusion restriction and increasing heterogeneous predominantly peripheral enhancement which is most evident during arterial phase imaging. The central mass of this lesion is best demonstrated on precontrast T1 weighted images (axial image 27 of series 15) where this currently measures 4.4 x 3.2 cm (previously only 3.1 x 2.6 cm when measured on axial image 27 of series 7 of the prior exam 04/06/2020 in a similar fashion), however, the lesion has much less well-defined more expansive borders when imaged on arterial phase of the examination (axial image 27 of series 17) where this appears to extend from the central aspect of segment 8 in 2 segment 7 posteriorly and segment 1 medially, overall measuring 6.7 x 3.9 cm.  Notably, there is also a new hypervascular lesion in segment 3 of the liver (axial image 31 of series 17 which measures 1.3 x 1.1 cm, and demonstrates diffusion restriction, compatible with a metastatic lesion. A small 6 mm hypervascular focus on arterial phase imaging (axial image 56 of series 17) is also noted in the inferior aspect of segment 5, but not confidently identified on diffusion weighted imaging. No intra or extrahepatic biliary ductal dilatation. Status post cholecystectomy. Pancreas: No  pancreatic mass. No pancreatic ductal dilatation. No pancreatic or peripancreatic fluid collections or inflammatory changes. Spleen:  Unremarkable. Adrenals/Urinary Tract: Bilateral kidneys and adrenal glands are normal in appearance. No hydroureteronephrosis in the visualized portions of the abdomen. Stomach/Bowel: Visualized portions are unremarkable. Vascular/Lymphatic: Aortic atherosclerosis, without evidence of aneurysm or dissection in the abdominal vasculature. No lymphadenopathy noted in the abdomen. Other: No significant volume of ascites noted in the visualized portions of the peritoneal cavity. Musculoskeletal: No aggressive appearing osseous lesions are noted in the visualized portions of the skeleton. IMPRESSION: 1. Findings are compatible with locally recurrent disease in the central aspect of the right lobe of the liver at site of prior ablation. In addition, there is at least 1 new metastatic lesion in segment 3 of the liver, and a small hypervascular lesion in the inferior aspect of segment 5 which is concerning for an additional metastatic lesion. 2. Aortic atherosclerosis. Electronically Signed   By: Vinnie Langton M.D.   On: 09/21/2020 14:57    Labs:  CBC: No results for input(s): WBC, HGB, HCT, PLT in the last 8760 hours.  COAGS: No results for input(s): INR, APTT in the last 8760 hours.  BMP: Recent Labs    12/26/19 1008 04/02/20 0909 09/19/20 1032  NA 139 139  --   K 3.8 4.8   --   CL 102 104  --   CO2 29 26  --   GLUCOSE 113* 198*  --   BUN 23 20 18   CALCIUM 9.0 9.5  --   CREATININE 1.00 1.14*  --   GFRNONAA 55* 47*  --   GFRAA >60 54*  --     LIVER FUNCTION TESTS: Recent Labs    12/26/19 1008 04/02/20 0909  BILITOT 0.4 0.6  AST 21 29  ALT 28 30  ALKPHOS 46 46  PROT 6.8 6.8  ALBUMIN 3.6 3.8    TUMOR MARKERS:   Assessment and Plan:  Niah Heinle a 75 y.o.femalewith past medical history significant for colon cancer, rheumatoid arthritis, diabetes and hyperlipidemia who was found to have an indeterminate slowly growing lesion within the central aspect of the liver for which he ultimately underwent ultrasound-guided liver biopsy on 05/11/2019 with pathologic diagnosis of well to moderately differentiated hepatocellular carcinoma, ultimately undergoing a successfulblandhepatic embolization on 07/24/2020.  Patient is seen today in telemedicine consultation following acquisition of most recent postprocedural abdominal MRI performed 09/21/2020.  The patient's daughter Wanda Curry accompanies the patient on today's telemedicine consultation.  Patient is currently without complaint.  Specifically, no change in appetite or energy level. No worsening abdominal pain. No change in appetite. No unintentional weight loss or weight gain.no yellowing of the skin or eyes.  Personal review of abdominal MRI performed 09/21/2020 demonstrates the following: Interval increase in size of the now approximately 4.2 x 3.1 cm lesion within the caudate lobe, previously, 3.1 x 2.7 cm, and now with thickened nodular peripheral enhancement. Additionally, there has been development of an  approximately 1.3 cm lesion within the left lobe of the liver (image 31, series 17) and tiny subcentimeter lesions within the subcapsular aspect of the lateral segment of the left lobe of the liver (image 31, series 17) and caudal aspect of the right lobe of the liver (image 56, series 17).  The  portal vein remains patent.  These findings are associated with elevation of the patient's AFP measuring 137 on 09/19/2020, previously, 11.5 on 03/23/2020. Note, the patient's bilirubin level was normal on most recent laboratory values performed  04/02/21 and BUN was found to be normal on 09/19/2020.  While patient does have history of colon cancer, I favor the new liver lesions likely represent additional sites of [place], supported by elevation of the patient's AFP.  Regardless, given disease progression, I do feel she would benefit from staging CT scan of the chest, abdomen and pelvis (with hepatic protocol for procedural planning purposes).  As the patient now has progressive multifocal disease, she is no longer a candidate for more targeted transarterial bland embolization and as such would recommend pursuing more global treatment with Y 90 radioembolization.  Risks and benefits of Y 90 radioembolization was discussed with the patient including, but not limited to bleeding, infection, vascular injury, post procedural pain, nausea, vomiting and fatigue, contrast induced renal failure, liver failure, radiation injury to the bowel, neutropenia and possible need for additional procedures. All of the patient's and the patient's daughter questions were answered.  Plan: - Staging CT of the chest, abdomen and pelvis (with cirrhotic protocol) to exclude presence of extra hepatic metastatic disease as well as for procedural planning purposes.  (CT to be performed at [facility] per pt request). - Obtain CMP and creatinine. - Following acquisition of staging CT, will confirm with the patient's referring oncologist, Dr. Delton Coombes, that liver lesion biopsy is not warranted. - Following acquisition of above, will perform another telemedicine consultation to finalize definitive plan of care (likely Y 90 radioembolization).  Above was discussed at length with the patient and the patient's daughter who demonstrated  excellent understanding of above and are in agreement with the proposed plan of care.  *Note, per pt request, all correspondences should be through the patient's daughter, Wanda Curry, who can be reached at [phone].  A copy of this report was sent to the requesting provider on this date.  Electronically Signed: Sandi Mariscal 09/27/2020, 10:48 AM   I spent a total of 15 Minutes in remote  clinical consultation, greater than 50% of which was counseling/coordinating care for [facility].    Visit type: Audio only (telephone). Audio (no video) only due to patient's lack of internet/smartphone capability. Alternative for in-person consultation at [facility], [address], [place], [place]. This visit type was conducted due to national recommendations for restrictions regarding the COVID-19 Pandemic (e.g. social distancing).  This format is felt to be most appropriate for this patient at this time.  All issues noted in this document were discussed and addressed.

## 2020-10-01 ENCOUNTER — Other Ambulatory Visit: Payer: Self-pay | Admitting: Interventional Radiology

## 2020-10-01 ENCOUNTER — Other Ambulatory Visit: Payer: Self-pay

## 2020-10-01 DIAGNOSIS — C220 Liver cell carcinoma: Secondary | ICD-10-CM

## 2020-10-15 ENCOUNTER — Other Ambulatory Visit: Payer: Self-pay | Admitting: Interventional Radiology

## 2020-10-15 DIAGNOSIS — C220 Liver cell carcinoma: Secondary | ICD-10-CM

## 2020-10-23 ENCOUNTER — Other Ambulatory Visit (HOSPITAL_COMMUNITY)
Admission: RE | Admit: 2020-10-23 | Discharge: 2020-10-23 | Disposition: A | Discharge status: Home / Self Care | Payer: Medicare Other | Source: Ambulatory Visit | Attending: Interventional Radiology | Admitting: Interventional Radiology

## 2020-10-23 ENCOUNTER — Other Ambulatory Visit: Payer: Self-pay

## 2020-10-23 DIAGNOSIS — C220 Liver cell carcinoma: Secondary | ICD-10-CM | POA: Insufficient documentation

## 2020-10-23 LAB — COMPREHENSIVE METABOLIC PANEL
ALT: 25 U/L (ref 0–44)
AST: 26 U/L (ref 15–41)
Albumin: 3.6 g/dL (ref 3.5–5.0)
Alkaline Phosphatase: 41 U/L (ref 38–126)
Anion gap: 9 (ref 5–15)
BUN: 19 mg/dL (ref 8–23)
CO2: 25 mmol/L (ref 22–32)
Calcium: 9.4 mg/dL (ref 8.9–10.3)
Chloride: 105 mmol/L (ref 98–111)
Creatinine, Ser: 1.04 mg/dL — ABNORMAL HIGH (ref 0.44–1.00)
GFR, Estimated: 56 mL/min — ABNORMAL LOW (ref >60)
Glucose, Bld: 224 mg/dL — ABNORMAL HIGH (ref 70–99)
Potassium: 4.3 mmol/L (ref 3.5–5.1)
Sodium: 139 mmol/L (ref 135–145)
Total Bilirubin: 0.9 mg/dL (ref 0.3–1.2)
Total Protein: 6.5 g/dL (ref 6.5–8.1)

## 2020-10-24 LAB — AFP TUMOR MARKER: AFP, Serum, Tumor Marker: 247 ng/mL — ABNORMAL HIGH (ref 0.0–9.2)

## 2020-10-25 ENCOUNTER — Ambulatory Visit (HOSPITAL_COMMUNITY)
Admission: RE | Admit: 2020-10-25 | Discharge: 2020-10-25 | Disposition: A | Discharge status: Home / Self Care | Payer: Medicare Other | Source: Ambulatory Visit | Attending: Interventional Radiology | Admitting: Interventional Radiology

## 2020-10-25 ENCOUNTER — Other Ambulatory Visit: Payer: Self-pay

## 2020-10-25 DIAGNOSIS — C220 Liver cell carcinoma: Secondary | ICD-10-CM

## 2020-10-25 MED ORDER — IOHEXOL 300 MG/ML  SOLN
100.0000 mL | Freq: Once | INTRAMUSCULAR | Status: AC | PRN
  Administered 2020-10-25: 100 mL via INTRAVENOUS

## 2020-10-26 ENCOUNTER — Telehealth: Payer: Self-pay | Admitting: Interventional Radiology

## 2020-10-26 NOTE — Telephone Encounter (Signed)
Called by Dr. Polly Cobia of Radiology regarding incidental finding of right lower lobe pulmonary embolism on pt's staging CT CAP from 3/10.  I discussed this finding with the patient who states that she has been taking her Xarelto, but not consistently given her easy bruising.  She reports baseline mild SOB but states this is unchanged.  Denies chest pain or hemoptysis.   She also reports mild ankle swelling but states this is a chronic issue for her and she denies worsening lower extremity edema or pain.  Given above, I do not feel additional intervention is warranted, rather I encouraged the pt to start taking her Xarelto as prescribed and instructed the patient to go to the ED if she were to experience acute onset of worsening SOB, chest pain or lower extremity pain/edmea.  Patient demonstrated excellent understanding of above conversation and was appreciative of the phone call.  Will see patient in tele visit in the coming weeks regarding treatment for progressive HCC.  Ronny Bacon, MD Pager #: [phone]

## 2020-10-31 ENCOUNTER — Encounter: Payer: Self-pay | Admitting: *Deleted

## 2020-10-31 ENCOUNTER — Other Ambulatory Visit: Payer: Self-pay

## 2020-10-31 ENCOUNTER — Other Ambulatory Visit: Payer: Self-pay | Admitting: Interventional Radiology

## 2020-10-31 ENCOUNTER — Ambulatory Visit
Admission: RE | Admit: 2020-10-31 | Discharge: 2020-10-31 | Disposition: A | Discharge status: Home / Self Care | Payer: Medicare Other | Source: Ambulatory Visit | Attending: Interventional Radiology | Admitting: Interventional Radiology

## 2020-10-31 DIAGNOSIS — C220 Liver cell carcinoma: Secondary | ICD-10-CM

## 2020-10-31 HISTORY — PX: IR RADIOLOGIST EVAL & MGMT: IMG5224

## 2020-10-31 NOTE — Progress Notes (Signed)
Patient ID: Wanda Curry, female   DOB: 1945/10/26, 75 y.o.   MRN: [id number]         Chief Complaint: [facility]  Referring Physician(s): Delton Coombes  History of Present Illness: Wanda Curry is a 75 y.o. female with past medical history significant for colon cancer, rheumatoid arthritis, diabetes and hyperlipidemia who was found to have an indeterminate slowly growing lesion within the central aspect of the liver for which he ultimately underwent ultrasound-guided liver biopsy on 05/11/2019 with pathologic diagnosis of well to moderately differentiated hepatocellular carcinoma, ultimately undergoing a blandhepatic embolization on 07/24/2020.  Patient is seen today in telemedicine consultation following acquisition of most recent postprocedural CT of the chest, abdomen and pelvis on 09/21/2020.  The patient's daughterSusanaccompanies the patient on today's telemedicine consultation.  Note, staging CT CAP performed 3/10 demonstrated an incidental finding of right lower lobe pulmonary embolism, however the patient was asymptomatic, specifically no shortness of breath or chest pain and admitted to not taking her Xarelto as prescribed as was documented in telephone encounter on 3/11.   Patient remains without complaint. Specifically, no delayed symptoms associated with her incidental pulmonary embolism such as chest pain or shortness of breath.  No change in appetite or energy level. No worsening abdominal pain. No change in appetite. No unintentional weight loss or weight gain. No yellowing of the skin or eyes.   Past Medical History:  Diagnosis Date  . Anemia   . Arthritis    RA  . Cancer ([place])    skin cancer  . Depression   . Diabetes mellitus without complication ([place])   . Fibromyalgia   . GERD (gastroesophageal reflux disease)   . Heart murmur    ECHO scheduled 05-25-2015  . Hyperlipidemia   . Neuropathy   . PONV (postoperative nausea and vomiting)     Past Surgical History:   Procedure Laterality Date  . ABDOMINAL HYSTERECTOMY    . BIOPSY  09/23/2016   Procedure: BIOPSY;  Surgeon: Danie Binder, MD;  [facility];  Service: Endoscopy;;  hepatic flexure mass  . CHOLECYSTECTOMY    . COLONOSCOPY N/A 08/13/2017   Procedure: COLONOSCOPY;  Surgeon: Daneil Dolin, MD;  [facility];  Service: Endoscopy;  Laterality: N/A;  . COLONOSCOPY WITH PROPOFOL N/A 09/23/2016   Procedure: COLONOSCOPY WITH PROPOFOL;  Surgeon: Danie Binder, MD;  [facility];  Service: Endoscopy;  Laterality: N/A;  7:30 am  . HARDWARE REMOVAL Right 05/30/2015   Procedure: HARDWARE REMOVAL;  Surgeon: Ninetta Lights, MD;  Location: [place];  Service: Orthopedics;  Laterality: Right;  . IR ANGIOGRAM SELECTIVE EACH ADDITIONAL VESSEL  07/25/2019  . IR ANGIOGRAM SELECTIVE EACH ADDITIONAL VESSEL  07/25/2019  . IR ANGIOGRAM VISCERAL SELECTIVE  07/25/2019  . IR ANGIOGRAM VISCERAL SELECTIVE  07/25/2019  . IR EMBO TUMOR ORGAN ISCHEMIA INFARCT INC GUIDE ROADMAPPING  07/25/2019  . IR RADIOLOGIST EVAL & MGMT  06/09/2019  . IR RADIOLOGIST EVAL & MGMT  07/07/2019  . IR RADIOLOGIST EVAL & MGMT  01/05/2020  . IR RADIOLOGIST EVAL & MGMT  04/12/2020  . IR RADIOLOGIST EVAL & MGMT  09/27/2020  . IR US GUIDE VASC ACCESS RIGHT  07/25/2019  . PARTIAL COLECTOMY N/A 10/10/2016   Procedure: PARTIAL COLECTOMY;  Surgeon: Aviva Signs, MD;  Location: [facility];  Service: General;  Laterality: N/A;  . planter warts Bilateral    both feet  . POLYPECTOMY  09/23/2016   Procedure: POLYPECTOMY;  Surgeon: Danie Binder, MD;  [facility];  Service: Endoscopy;;  transverse colon polyps times 2, rectal polyp  . skin grafts     due to planter warts  . TOTAL KNEE ARTHROPLASTY Right 05/30/2015   Procedure: RIGHT TOTAL KNEE ARTHROPLASTY;  Surgeon: Ninetta Lights, MD;  Location: [place];  Service: Orthopedics;  Laterality: Right;    Allergies: Adhesive [tape], Sulfa antibiotics, and  Erythromycin  Medications: Prior to Admission medications   Medication Sig Start Date End Date Taking? Authorizing Provider  acetaminophen ([employer] PAIN RELIEF EXTRA STRENGTH) 500 MG tablet Take 500 mg by mouth every 6 (six) hours as needed (for pain.).    [provider]  Calcium Carb-Cholecalciferol (CALCIUM + D3) 600-200 MG-UNIT TABS Take 1 tablet by mouth 2 (two) times daily. 07/16/16   [provider]  carvedilol (COREG) 6.25 MG tablet Take 6.25 mg by mouth 2 (two) times daily with a meal.    [provider]  folic acid (FOLVITE) 1 MG tablet Take 1 mg by mouth daily.    [provider]  furosemide (LASIX) 40 MG tablet Take 40 mg by mouth daily. 07/24/19   [provider]  gabapentin (NEURONTIN) 600 MG tablet Take 600 mg by mouth 2 (two) times daily as needed (nerve pain).     [provider]  HYDROcodone-acetaminophen (NORCO) 5-325 MG tablet Take 1 tablet by mouth every 6 (six) hours as needed for moderate pain. 07/26/19   Allred, Darrell K, PA-C  Insulin Glargine (BASAGLAR KWIKPEN) 100 UNIT/ML SOPN Inject 36 Units into the skin daily.  12/23/17   [provider]  KLOR-CON M20 20 MEQ tablet Take 20 mEq by mouth daily.  07/28/17   [provider]  loperamide (IMODIUM) 2 MG capsule Take 2 mg by mouth daily.    [provider]  losartan (COZAAR) 25 MG tablet Take 25 mg by mouth daily. 04/20/19   [provider]  Magnesium Gluconate 250 MG TABS Take 250 mg by mouth daily as needed (cramps).     [provider]  methotrexate (RHEUMATREX) 2.5 MG tablet Take 20 mg by mouth every Sunday. Caution:Chemotherapy. Protect from light.     [provider]  Milnacipran HCl (SAVELLA) 100 MG TABS tablet Take 100 mg by mouth 2 (two) times daily.    [provider]  Misc Natural Products (OSTEO BI-FLEX ADV JOINT SHIELD PO) Take 1 tablet by mouth daily.     [provider]  Multiple  Vitamins-Minerals (CENTRUM SILVER PO) Take 1 tablet by mouth daily.    [provider]  NOVOLOG MIX 70/30 FLEXPEN (70-30) 100 UNIT/ML FlexPen Inject 20-30 Units into the skin 2 (two) times daily with a meal. Take 30 units in the am and Take 20 units in the evening 09/14/16   [provider]  omeprazole (PRILOSEC) 40 MG capsule Take 40 mg by mouth daily.  11/17/16   [provider]  rOPINIRole (REQUIP) 1 MG tablet Take 1 mg by mouth every morning. MAY TAKE 2 ADDITIONAL DOSES  AS NEEDED    [provider]  XARELTO 10 MG TABS tablet TAKE 1 TABLET BY MOUTH DAILY WITH SUPPER Patient taking differently: Take 10 mg by mouth daily.  06/28/19   Glennie Isle, NP-C     Family History  Problem Relation Age of Onset  . Colon cancer Neg Hx     Social History   Socioeconomic History  . Marital status: Widowed    Spouse name: Not on file  .  Number of children: Not on file  . Years of education: Not on file  . Highest education level: Not on file  Occupational History  . Not on file  Tobacco Use  . Smoking status: Former Smoker    Packs/day: 1.00    Years: 40.00    Pack years: 40.00    Types: Cigarettes    Quit date: 08/18/2004    Years since quitting: 16.2  . Smokeless tobacco: Never Used  Vaping Use  . Vaping Use: Never used  Substance and Sexual Activity  . Alcohol use: No  . Drug use: No  . Sexual activity: Not Currently    Birth control/protection: Surgical  Other Topics Concern  . Not on file  Social History Narrative  . Not on file   Social Determinants of Health   Financial Resource Strain: Not on file  Food Insecurity: Not on file  Transportation Needs: Not on file  Physical Activity: Not on file  Stress: Not on file  Social Connections: Not on file    ECOG Status: 0 - Asymptomatic  Review of Systems  Review of Systems: A 12 point ROS discussed and pertinent positives are indicated in the HPI above.  All other systems are  negative.  Physical Exam No direct physical exam was performed (except for noted visual exam findings with Video Visits).   Vital Signs: There were no vitals taken for this visit.  Imaging:  Abdominal MRI - 09/21/2020; 04/06/2020; 12/27/2019; 04/21/2019 CTA abdomen and pelvis - 06/22/2019 Blandhepatic embolization - 07/25/2019 Chest CT - 05/31/2019 CT CAP - 10/25/20  Personal review of abdominal MRI performed 09/21/2020 demonstrates interval increase in size of the now approximately 4.2 x 3.1 cm lesion within the caudate lobe, previously, 3.1 x 2.7 cm, and now with thickened nodular peripheral enhancement.  Additionally, there has been development of an  approximately 1.3 cm lesion within the left lobe of the liver (image 31, series 17) and tiny subcentimeter lesions within the subcapsular aspect of the lateral segment of the left lobe of the liver (image 31, series 17) and caudal aspect of the right lobe of the liver (image 56, series 17).   The portal vein remains patent.  CT CAP preformed 3/10 demonstrates apparent enlargement of dominant lesion within the left lobe of the liver, now measuring approximately 1.5 cm, however was negative for evidence of extrahepatic metastatic disease.    CT ABDOMEN PELVIS W WO CONTRAST  Result Date: 10/26/2020 CLINICAL DATA:  Ascending colon cancer status post right hemicolectomy in 2018. Hepatocellular carcinoma diagnosed September 2020 status post bland embolization 07/25/2019, with findings concerning for multifocal liver recurrence on recent MRI. Restaging. EXAM: CT CHEST WITH CONTRAST CT ABDOMEN AND PELVIS WITH AND WITHOUT CONTRAST TECHNIQUE: Multidetector CT imaging of the chest was performed during intravenous contrast administration. Multidetector CT imaging of the abdomen and pelvis was performed following the standard protocol before and during bolus administration of intravenous contrast. CONTRAST:  160mL OMNIPAQUE IOHEXOL 300 MG/ML  SOLN  COMPARISON:  09/21/2020 MRI abdomen. 05/31/2019 chest CT. 03/29/2019 CT abdomen/pelvis. 07/01/2019 CT angiogram of the abdomen and pelvis. FINDINGS: CT CHEST FINDINGS Cardiovascular: Normal heart size. No significant pericardial effusion/thickening. Left anterior descending coronary atherosclerosis. Atherosclerotic nonaneurysmal thoracic aorta. Normal caliber pulmonary arteries. Incidental acute lobar, segmental and subsegmental pulmonary embolism in the right lower lobe (series 4/image 43). Mediastinum/Nodes: Mild multinodular goiter with dominant heterogeneous hypodense 1.8 cm right thyroid nodule, decreased from 2.2 cm on prior chest CT. Unremarkable esophagus. No pathologically enlarged axillary,  mediastinal or hilar lymph nodes. Lungs/Pleura: No pneumothorax. No pleural effusion. No acute consolidative airspace disease or lung masses. Peripheral right middle lobe 6 mm solid pulmonary nodule (series 14/image 60) and 2 mm posterior left upper lobe nodule (series 14/image 41) are both stable and considered benign. No new significant pulmonary nodules. Moderate patchy subpleural reticulation and ground-glass opacity in both lungs, with a basilar predominance, similar to mildly increased. No frank honeycombing. Musculoskeletal: No aggressive appearing focal osseous lesions. Marked thoracic spondylosis. CT ABDOMEN AND PELVIS FINDINGS Hepatobiliary: Segment 3 left liver 1.8 x 1.4 cm mass with arterial hyperenhancement (series 3/image 27), new from prior CT, increased from 1.2 x 1.2 cm on 09/21/2020 MRI. Far inferior right liver 1.0 x 0.9 cm hyperenhancing lesion (series 3/image 55), new from prior CT, increased from 0.8 x 0.7 cm on 09/21/2020 MRI. Recurrent hyperenhancing tumor at the treated tumor site in the central liver, now measuring 6.0 x 5.0 cm with extension into the caudate lobe (series 3/image 24). Cholecystectomy. No biliary ductal dilatation. Pancreas: Normal, with no mass or duct dilation. Spleen: Normal  size. No mass. Adrenals/Urinary Tract: No discrete adrenal nodules. No hydronephrosis. No renal stones. No renal masses. Normal bladder. Stomach/Bowel: Normal non-distended stomach. Stable postsurgical changes from right hemicolectomy with ileocolic anastomosis in the upper abdomen. No dilated or thick-walled small bowel loops. Mild sigmoid diverticulosis with no large bowel wall thickening or significant pericolonic fat stranding in the remnant large-bowel. Vascular/Lymphatic: Atherosclerotic nonaneurysmal abdominal aorta. Patent portal, splenic and renal veins. Possible tumor invasion of the middle hepatic vein (series 4/image 74), potentially extending into the anterior aspect of the intrahepatic IVC. No pathologically enlarged lymph nodes in the abdomen or pelvis. Reproductive: Status post hysterectomy, with no abnormal findings at the vaginal cuff. No adnexal mass. Other: No pneumoperitoneum, ascites or focal fluid collection. Musculoskeletal: No aggressive appearing focal osseous lesions. Mild lumbar spondylosis. IMPRESSION: 1. Incidental acute right lower lobe pulmonary embolism, extending to the lobar level. 2. No evidence of metastatic disease in the chest. 3. Recurrent hypervascular tumor at the site of the treated central liver neoplasm, no measuring 6.0 x 5.0 cm, extending into the caudate lobe, with possible tumor invasion of the middle hepatic vein and potential extension into the anterior aspect of the intrahepatic IVC. 4. Hypervascular segment 3 left liver and far inferior right liver metastases, enlarging since 09/21/2020 MRI. 5. No additional sites of metastatic disease in the abdomen or pelvis. 6. Mild multinodular goiter. Dominant 1.8 cm right thyroid nodule, decreased. In the setting of significant comorbidities or limited life expectancy, no follow-up recommended (ref: J Am Coll Radiol. 2015 Feb;12(2): 143-50). 7. Spectrum of findings potentially indicating basilar predominant interstitial lung  disease, similar to slightly worsened. 8. Aortic Atherosclerosis (ICD10-I70.0). Critical Value/emergent results were called by telephone at the time of interpretation on 10/26/2020 at 3:47 pm to provider [facility] , who verbally acknowledged these results. Electronically Signed   By: Ilona Sorrel M.D.   On: 10/26/2020 16:10   CT CHEST W CONTRAST  Result Date: 10/26/2020 CLINICAL DATA:  Ascending colon cancer status post right hemicolectomy in 2018. Hepatocellular carcinoma diagnosed September 2020 status post bland embolization 07/25/2019, with findings concerning for multifocal liver recurrence on recent MRI. Restaging. EXAM: CT CHEST WITH CONTRAST CT ABDOMEN AND PELVIS WITH AND WITHOUT CONTRAST TECHNIQUE: Multidetector CT imaging of the chest was performed during intravenous contrast administration. Multidetector CT imaging of the abdomen and pelvis was performed following the standard protocol before and during bolus administration of  intravenous contrast. CONTRAST:  162mL OMNIPAQUE IOHEXOL 300 MG/ML  SOLN COMPARISON:  09/21/2020 MRI abdomen. 05/31/2019 chest CT. 03/29/2019 CT abdomen/pelvis. 07/01/2019 CT angiogram of the abdomen and pelvis. FINDINGS: CT CHEST FINDINGS Cardiovascular: Normal heart size. No significant pericardial effusion/thickening. Left anterior descending coronary atherosclerosis. Atherosclerotic nonaneurysmal thoracic aorta. Normal caliber pulmonary arteries. Incidental acute lobar, segmental and subsegmental pulmonary embolism in the right lower lobe (series 4/image 43). Mediastinum/Nodes: Mild multinodular goiter with dominant heterogeneous hypodense 1.8 cm right thyroid nodule, decreased from 2.2 cm on prior chest CT. Unremarkable esophagus. No pathologically enlarged axillary, mediastinal or hilar lymph nodes. Lungs/Pleura: No pneumothorax. No pleural effusion. No acute consolidative airspace disease or lung masses. Peripheral right middle lobe 6 mm solid pulmonary nodule (series  14/image 60) and 2 mm posterior left upper lobe nodule (series 14/image 41) are both stable and considered benign. No new significant pulmonary nodules. Moderate patchy subpleural reticulation and ground-glass opacity in both lungs, with a basilar predominance, similar to mildly increased. No frank honeycombing. Musculoskeletal: No aggressive appearing focal osseous lesions. Marked thoracic spondylosis. CT ABDOMEN AND PELVIS FINDINGS Hepatobiliary: Segment 3 left liver 1.8 x 1.4 cm mass with arterial hyperenhancement (series 3/image 27), new from prior CT, increased from 1.2 x 1.2 cm on 09/21/2020 MRI. Far inferior right liver 1.0 x 0.9 cm hyperenhancing lesion (series 3/image 55), new from prior CT, increased from 0.8 x 0.7 cm on 09/21/2020 MRI. Recurrent hyperenhancing tumor at the treated tumor site in the central liver, now measuring 6.0 x 5.0 cm with extension into the caudate lobe (series 3/image 24). Cholecystectomy. No biliary ductal dilatation. Pancreas: Normal, with no mass or duct dilation. Spleen: Normal size. No mass. Adrenals/Urinary Tract: No discrete adrenal nodules. No hydronephrosis. No renal stones. No renal masses. Normal bladder. Stomach/Bowel: Normal non-distended stomach. Stable postsurgical changes from right hemicolectomy with ileocolic anastomosis in the upper abdomen. No dilated or thick-walled small bowel loops. Mild sigmoid diverticulosis with no large bowel wall thickening or significant pericolonic fat stranding in the remnant large-bowel. Vascular/Lymphatic: Atherosclerotic nonaneurysmal abdominal aorta. Patent portal, splenic and renal veins. Possible tumor invasion of the middle hepatic vein (series 4/image 74), potentially extending into the anterior aspect of the intrahepatic IVC. No pathologically enlarged lymph nodes in the abdomen or pelvis. Reproductive: Status post hysterectomy, with no abnormal findings at the vaginal cuff. No adnexal mass. Other: No pneumoperitoneum,  ascites or focal fluid collection. Musculoskeletal: No aggressive appearing focal osseous lesions. Mild lumbar spondylosis. IMPRESSION: 1. Incidental acute right lower lobe pulmonary embolism, extending to the lobar level. 2. No evidence of metastatic disease in the chest. 3. Recurrent hypervascular tumor at the site of the treated central liver neoplasm, no measuring 6.0 x 5.0 cm, extending into the caudate lobe, with possible tumor invasion of the middle hepatic vein and potential extension into the anterior aspect of the intrahepatic IVC. 4. Hypervascular segment 3 left liver and far inferior right liver metastases, enlarging since 09/21/2020 MRI. 5. No additional sites of metastatic disease in the abdomen or pelvis. 6. Mild multinodular goiter. Dominant 1.8 cm right thyroid nodule, decreased. In the setting of significant comorbidities or limited life expectancy, no follow-up recommended (ref: J Am Coll Radiol. 2015 Feb;12(2): 143-50). 7. Spectrum of findings potentially indicating basilar predominant interstitial lung disease, similar to slightly worsened. 8. Aortic Atherosclerosis (ICD10-I70.0). Critical Value/emergent results were called by telephone at the time of interpretation on 10/26/2020 at 3:47 pm to provider [facility] , who verbally acknowledged these results. Electronically Signed   By:  Ilona Sorrel M.D.   On: 10/26/2020 16:10    Labs:  CBC: No results for input(s): WBC, HGB, HCT, PLT in the last 8760 hours.  COAGS: No results for input(s): INR, APTT in the last 8760 hours.  BMP: Recent Labs    12/26/19 1008 04/02/20 0909 09/19/20 1032 10/23/20 1004  NA 139 139  --  139  K 3.8 4.8  --  4.3  CL 102 104  --  105  CO2 29 26  --  25  GLUCOSE 113* 198*  --  224*  BUN 23 20 18 19   CALCIUM 9.0 9.5  --  9.4  CREATININE 1.00 1.14*  --  1.04*  GFRNONAA 55* 47*  --  56*  GFRAA >60 54*  --   --     LIVER FUNCTION TESTS: Recent Labs    12/26/19 1008 04/02/20 0909 10/23/20 1004   BILITOT 0.4 0.6 0.9  AST 21 29 26   ALT 28 30 25   ALKPHOS 46 46 41  PROT 6.8 6.8 6.5  ALBUMIN 3.6 3.8 3.6    TUMOR MARKERS:    Assessment and Plan:  Zailah Zagami is a 75 y.o. female with past medical history significant for colon cancer, rheumatoid arthritis, diabetes and hyperlipidemia who was found to have an indeterminate slowly growing lesion within the central aspect of the liver for which he ultimately underwent ultrasound-guided liver biopsy on 05/11/2019 with pathologic diagnosis of well to moderately differentiated hepatocellular carcinoma, ultimately undergoing a blandhepatic embolization on 07/24/2020.  Imaging:  Abdominal MRI - 09/21/2020; 04/06/2020; 12/27/2019; 04/21/2019 CTA abdomen and pelvis - 06/22/2019 Blandhepatic embolization - 07/25/2019 Chest CT - 05/31/2019 CT CAP - 10/25/20  Personal review of abdominal MRI performed 09/21/2020 demonstrates interval increase in size of the now approximately 4.2 x 3.1 cm lesion within the caudate lobe, previously, 3.1 x 2.7 cm, and now with thickened nodular peripheral enhancement.  Additionally, there has been development of an  approximately 1.3 cm lesion within the left lobe of the liver (image 31, series 17) and tiny subcentimeter lesions within the subcapsular aspect of the lateral segment of the left lobe of the liver (image 31, series 17) and caudal aspect of the right lobe of the liver (image 56, series 17).   The portal vein remains patent.  CT CAP preformed 3/10 demonstrates apparent enlargement of dominant lesion within the left lobe of the liver, now measuring approximately 1.5 cm, however was negative for evidence of extrahepatic metastatic disease.  This finding is associated with continued elevation of the AFP, 247 on 3/9, only 137 on 2/2.   Above was discussed with referring Oncologist, Dr. Delton Coombes, and given her history of colon cancer, he would like to pursue an ultrasound guided liver lesion biopsy to confirm  the diagnosis of multifocal [place].    I think this is a reasonable request and that the dominant lesion within the left lobe of the liver (CT CAP - 10/25/20 image 27 series 3) would be amendable to attempted biopsy.  Following the biopsy, the patient will f/u with Dr. Delton Coombes to formulate a definitive treatment plan.  I explained that if liver directed therapy is desired, I would pursue a more global transcatheter treatment with Y90 radioembolization as she now has bilobar disease.  (note, pt's bilirubin remains WNL).  The patient and the patient's daughter demonstrated excellent understanding of the above POC and know to call the IR clinic with any interval questions or concerns.  PLAN: - - US guided liver  lesion biopsy to confirm development of multifocal HCC at the request of Dr. Delton Coombes.  Recommend targeting diominant lesion within the left lobe of the liver on CT CAP - 10/25/20 image 27 series 3.  - F/U with Dr. Delton Coombes  - Tele visit for formal conversation regarding Y-90 at the discretion of Dr. Delton Coombes.   A copy of this report was sent to the requesting provider on this date.  Electronically Signed: Sandi Mariscal 10/31/2020, 4:15 PM   I spent a total of 15 Minutes in remote  clinical consultation, greater than 50% of which was counseling/coordinating care for progress [place].    Visit type: Audio only (telephone). Audio (no video) only due to patient's lack of internet/smartphone capability. Alternative for in-person consultation at [facility], [address], [place], [place]. This visit type was conducted due to national recommendations for restrictions regarding the COVID-19 Pandemic (e.g. social distancing).  This format is felt to be most appropriate for this patient at this time.  All issues noted in this document were discussed and addressed.

## 2020-11-01 ENCOUNTER — Other Ambulatory Visit (HOSPITAL_COMMUNITY): Payer: Self-pay

## 2020-11-01 DIAGNOSIS — C220 Liver cell carcinoma: Secondary | ICD-10-CM

## 2020-11-01 DIAGNOSIS — C189 Malignant neoplasm of colon, unspecified: Secondary | ICD-10-CM

## 2020-11-02 ENCOUNTER — Other Ambulatory Visit (HOSPITAL_COMMUNITY): Payer: Self-pay | Admitting: Hematology

## 2020-11-02 DIAGNOSIS — C220 Liver cell carcinoma: Secondary | ICD-10-CM

## 2020-11-07 ENCOUNTER — Encounter (HOSPITAL_COMMUNITY): Payer: Self-pay

## 2020-11-07 NOTE — Progress Notes (Unsigned)
Wanda Curry Female, 75 y.o., [birth year]  MRN:  [id number] Phone:  [phone] [name])       PCP:  Earney Mallet, MD Coverage:  Sherre Poot [facility] Medicare/[employer] Medicare  Next Appt With Radiology ([facility] 2) 11/29/2020 at 1:00 PM           RE: Clarification needed Received: 5 days ago  Message Details  Derek Jack, MD  Lennox Solders E Yes. She's ok to hold it prior to biopsy.    Previous Messages  ----- Message -----  From: Lenore Cordia  Sent: 11/02/2020  4:09 PM EDT  To: Derek Jack, MD  Subject: FW: Clarification needed             Hello Dr Worthy Keeler    [name] is on the Blood thinner xarelto  She will need to hold it one day prior to her biopsy  Permission is needed   [name]  ----- Message -----  From: Sandi Mariscal, MD  Sent: 11/02/2020  4:00 PM EDT  To: Derek Jack, MD, Lenore Cordia  Subject: RE: Clarification needed             Please set up pt for US guided liver lesion Bx.   You may put the order either under my name or Dr. Delton Coombes.   [name]    ----- Message -----  From: Lenore Cordia  Sent: 11/01/2020 11:12 AM EDT  To: Sandi Mariscal, MD, Derek Jack, MD  Subject: Clarification needed               Hello   An order has been entered, by [facility] imaging  There is no review but the message in comments is:   Marland KitchenMarland KitchenDr Pascal Lux has s/w Oncologist Dr. Delton Coombes he would like Dr Pascal Lux to do Liver Biopsy  Targeting dominate lesion within the left lobe of the liver on Ct Cap 10-25-20 Image 27 Series 3....    Should the ordering doctor be changed to Dr Delton Coombes?  [name]

## 2020-11-13 ENCOUNTER — Telehealth: Payer: Medicare Other

## 2020-11-21 ENCOUNTER — Inpatient Hospital Stay (HOSPITAL_COMMUNITY): Payer: Medicare Other | Attending: Hematology

## 2020-11-21 ENCOUNTER — Other Ambulatory Visit: Payer: Self-pay

## 2020-11-21 ENCOUNTER — Inpatient Hospital Stay (HOSPITAL_BASED_OUTPATIENT_CLINIC_OR_DEPARTMENT_OTHER): Payer: Medicare Other | Admitting: Hematology

## 2020-11-21 VITALS — BP 143/56 | HR 72 | Temp 96.9°F | Resp 18 | Wt 227.8 lb

## 2020-11-21 DIAGNOSIS — C189 Malignant neoplasm of colon, unspecified: Secondary | ICD-10-CM

## 2020-11-21 DIAGNOSIS — R197 Diarrhea, unspecified: Secondary | ICD-10-CM | POA: Diagnosis not present

## 2020-11-21 DIAGNOSIS — Z7901 Long term (current) use of anticoagulants: Secondary | ICD-10-CM | POA: Insufficient documentation

## 2020-11-21 DIAGNOSIS — Z87891 Personal history of nicotine dependence: Secondary | ICD-10-CM | POA: Insufficient documentation

## 2020-11-21 DIAGNOSIS — C182 Malignant neoplasm of ascending colon: Secondary | ICD-10-CM

## 2020-11-21 DIAGNOSIS — Z86711 Personal history of pulmonary embolism: Secondary | ICD-10-CM | POA: Insufficient documentation

## 2020-11-21 DIAGNOSIS — C220 Liver cell carcinoma: Secondary | ICD-10-CM | POA: Diagnosis present

## 2020-11-21 LAB — CBC WITH DIFFERENTIAL/PLATELET
Abs Immature Granulocytes: 0.05 10*3/uL (ref 0.00–0.07)
Basophils Absolute: 0 10*3/uL (ref 0.0–0.1)
Basophils Relative: 0 %
Eosinophils Absolute: 0.1 10*3/uL (ref 0.0–0.5)
Eosinophils Relative: 2 %
HCT: 43.2 % (ref 36.0–46.0)
Hemoglobin: 13.6 g/dL (ref 12.0–15.0)
Immature Granulocytes: 1 %
Lymphocytes Relative: 20 %
Lymphs Abs: 1.9 10*3/uL (ref 0.7–4.0)
MCH: 31 pg (ref 26.0–34.0)
MCHC: 31.5 g/dL (ref 30.0–36.0)
MCV: 98.4 fL (ref 80.0–100.0)
Monocytes Absolute: 0.8 10*3/uL (ref 0.1–1.0)
Monocytes Relative: 8 %
Neutro Abs: 6.6 10*3/uL (ref 1.7–7.7)
Neutrophils Relative %: 69 %
Platelets: 189 10*3/uL (ref 150–400)
RBC: 4.39 MIL/uL (ref 3.87–5.11)
RDW: 13.4 % (ref 11.5–15.5)
WBC: 9.4 10*3/uL (ref 4.0–10.5)
nRBC: 0 % (ref 0.0–0.2)

## 2020-11-21 LAB — COMPREHENSIVE METABOLIC PANEL
ALT: 23 U/L (ref 0–44)
AST: 25 U/L (ref 15–41)
Albumin: 3.6 g/dL (ref 3.5–5.0)
Alkaline Phosphatase: 41 U/L (ref 38–126)
Anion gap: 7 (ref 5–15)
BUN: 19 mg/dL (ref 8–23)
CO2: 29 mmol/L (ref 22–32)
Calcium: 9.3 mg/dL (ref 8.9–10.3)
Chloride: 104 mmol/L (ref 98–111)
Creatinine, Ser: 1.12 mg/dL — ABNORMAL HIGH (ref 0.44–1.00)
GFR, Estimated: 51 mL/min — ABNORMAL LOW (ref >60)
Glucose, Bld: 92 mg/dL (ref 70–99)
Potassium: 5 mmol/L (ref 3.5–5.1)
Sodium: 140 mmol/L (ref 135–145)
Total Bilirubin: 0.7 mg/dL (ref 0.3–1.2)
Total Protein: 7.1 g/dL (ref 6.5–8.1)

## 2020-11-21 MED ORDER — RIVAROXABAN 20 MG PO TABS: 20.0000 mg | ORAL_TABLET | Freq: Every day | ORAL | 6 refills | Status: DC

## 2020-11-21 NOTE — Patient Instructions (Signed)
[place] at [facility] Discharge Instructions  You were seen today by Dr. Delton Coombes. He went over your recent results and scans. Keep your liver biopsy appointment on April 14th. Start taking Xarelto 20 mg daily with supper. Dr. Delton Coombes will see you back after the biopsy results in follow up.   Thank you for choosing [place] at [facility] to provide your oncology and hematology care.  To afford each patient quality time with our provider, please arrive at least 15 minutes before your scheduled appointment time.   If you have a lab appointment with the [place] please come in thru the Main Entrance and check in at the main information desk  You need to re-schedule your appointment should you arrive 10 or more minutes late.  We strive to give you quality time with our providers, and arriving late affects you and other patients whose appointments are after yours.  Also, if you no show three or more times for appointments you may be dismissed from the clinic at the providers discretion.     Again, thank you for choosing [facility].  Our hope is that these requests will decrease the amount of time that you wait before being seen by our physicians.       _____________________________________________________________  Should you have questions after your visit to [facility], please contact our office at [phone] between the hours of 8:00 a.m. and 4:30 p.m.  Voicemails left after 4:00 p.m. will not be returned until the following business day.  For prescription refill requests, have your pharmacy contact our office and allow 72 hours.    Cancer Center Support Programs:   > Cancer Support Group  2nd Tuesday of the month 1pm-2pm, Journey Room

## 2020-11-21 NOTE — Progress Notes (Signed)
Wanda Curry, [place] [address]   CLINIC:  Medical Oncology/Hematology  PCP:  Earney Mallet, MD [address] / [place] [address] [phone]   REASON FOR VISIT:  Follow-up for colon cancer, hepatocellular carcinoma and iron deficiency state  PRIOR THERAPY:  1. Right hemicolectomy on 10/10/2016. 2. Intermittent Feraheme last on 09/22/2018.  NGS Results: Not done  CURRENT THERAPY: Surveillance  BRIEF ONCOLOGIC HISTORY:  Oncology History  Colon cancer ([place])  10/10/2016 Initial Diagnosis   Colon cancer ([place])   10/10/2016 Surgery   Partial colectomy by Dr. Arnoldo Morale  Pathology shows  a 2.1 cm grade 2 adenocarcinoma of the ascending colon with invasion through the muscularis propria into the peri-colorectal tissues.      11/17/2016 PET scan   The hepatic lesion seen on the CT scan and MRI does not demonstrate hypermetabolism and is likely a benign entity.  Right maxillary sinus disease with associated hypermetabolism.  Scattered pulmonary nodules, likely benign. No follow-up needed if patient is low-risk (and has no known or suspected primary neoplasm). Non-contrast chest CT can be considered in 12 months if patient is high-risk.    12/25/2016 - 02/04/2017 Adjuvant Chemotherapy   Xeloda 2000mg  PO BID take for 14 days out of 21 days. Plan for total of 24 weeks of treatment.     CANCER STAGING: Cancer Staging Colon cancer [facility]) Staging form: Colon and Rectum, AJCC 8th Edition - Clinical: Stage IIA (cT3, cN0, cM0) - Signed by Wanda First, MD on 11/26/2016   INTERVAL HISTORY:  Wanda Curry, a 75 y.o. female, returns for routine follow-up of her hepatocellular carcinoma, colon cancer and iron deficiency state. Meztli was last seen on 06/02/2019.   Today she is accompanied by her daughter and she reports feeling okay. She continues having diarrhea and takes Imodium several times a day. Her energy is still decreased. She is  back to taking Xarelto 10 mg QHS after doing her procedure. She has SOB when she walks a long distance or walks briskly, but denies pleuritic CP.  She is scheduled to have her US liver biopsy on 11/29/2020.  RTC after biopsy results (around 04/19)  REVIEW OF SYSTEMS:  Review of Systems  Constitutional: Positive for fatigue (25%). Negative for appetite change.  Respiratory: Positive for shortness of breath (w/ prolonged or brisk walking).   Cardiovascular: Negative for chest pain.  Gastrointestinal: Positive for diarrhea (on Imodium).  All other systems reviewed and are negative.   PAST MEDICAL/SURGICAL HISTORY:  Past Medical History:  Diagnosis Date  . Anemia   . Arthritis    RA  . Cancer ([place])    skin cancer  . Depression   . Diabetes mellitus without complication ([place])   . Fibromyalgia   . GERD (gastroesophageal reflux disease)   . Heart murmur    ECHO scheduled 05-25-2015  . Hyperlipidemia   . Neuropathy   . PONV (postoperative nausea and vomiting)    Past Surgical History:  Procedure Laterality Date  . ABDOMINAL HYSTERECTOMY    . BIOPSY  09/23/2016   Procedure: BIOPSY;  Surgeon: Danie Binder, MD;  [facility];  Service: Endoscopy;;  hepatic flexure mass  . CHOLECYSTECTOMY    . COLONOSCOPY N/A 08/13/2017   Procedure: COLONOSCOPY;  Surgeon: Daneil Dolin, MD;  [facility];  Service: Endoscopy;  Laterality: N/A;  . COLONOSCOPY WITH PROPOFOL N/A 09/23/2016   Procedure: COLONOSCOPY WITH PROPOFOL;  Surgeon: Danie Binder, MD;  [facility];  Service: Endoscopy;  Laterality: N/A;  7:30 am  . HARDWARE REMOVAL Right 05/30/2015   Procedure: HARDWARE REMOVAL;  Surgeon: Ninetta Lights, MD;  Location: [place];  Service: Orthopedics;  Laterality: Right;  . IR ANGIOGRAM SELECTIVE EACH ADDITIONAL VESSEL  07/25/2019  . IR ANGIOGRAM SELECTIVE EACH ADDITIONAL VESSEL  07/25/2019  . IR ANGIOGRAM VISCERAL SELECTIVE  07/25/2019  . IR ANGIOGRAM VISCERAL  SELECTIVE  07/25/2019  . IR EMBO TUMOR ORGAN ISCHEMIA INFARCT INC GUIDE ROADMAPPING  07/25/2019  . IR RADIOLOGIST EVAL & MGMT  06/09/2019  . IR RADIOLOGIST EVAL & MGMT  07/07/2019  . IR RADIOLOGIST EVAL & MGMT  01/05/2020  . IR RADIOLOGIST EVAL & MGMT  04/12/2020  . IR RADIOLOGIST EVAL & MGMT  09/27/2020  . IR RADIOLOGIST EVAL & MGMT  10/31/2020  . IR US GUIDE VASC ACCESS RIGHT  07/25/2019  . PARTIAL COLECTOMY N/A 10/10/2016   Procedure: PARTIAL COLECTOMY;  Surgeon: Aviva Signs, MD;  Location[facility];  Service: General;  Laterality: N/A;  . planter warts Bilateral    both feet  . POLYPECTOMY  09/23/2016   Procedure: POLYPECTOMY;  Surgeon: Danie Binder, MD;  [facility];  Service: Endoscopy;;  transverse colon polyps times 2, rectal polyp  . skin grafts     due to planter warts  . TOTAL KNEE ARTHROPLASTY Right 05/30/2015   Procedure: RIGHT TOTAL KNEE ARTHROPLASTY;  Surgeon: Ninetta Lights, MD;  Location: [facility];  Service: Orthopedics;  Laterality: Right;    SOCIAL HISTORY:  Social History   Socioeconomic History  . Marital status: Widowed    Spouse name: Not on file  . Number of children: Not on file  . Years of education: Not on file  . Highest education level: Not on file  Occupational History  . Not on file  Tobacco Use  . Smoking status: Former Smoker    Packs/day: 1.00    Years: 40.00    Pack years: 40.00    Types: Cigarettes    Quit date: 08/18/2004    Years since quitting: 16.2  . Smokeless tobacco: Never Used  Vaping Use  . Vaping Use: Never used  Substance and Sexual Activity  . Alcohol use: No  . Drug use: No  . Sexual activity: Not Currently    Birth control/protection: Surgical  Other Topics Concern  . Not on file  Social History Narrative  . Not on file   Social Determinants of Health   Financial Resource Strain: Not on file  Food Insecurity: Not on file  Transportation Needs: Not on file  Physical Activity: Not on file  Stress: Not on file   Social Connections: Not on file  Intimate Partner Violence: Not on file    FAMILY HISTORY:  Family History  Problem Relation Age of Onset  . Colon cancer Neg Hx     CURRENT MEDICATIONS:  Current Outpatient Medications  Medication Sig Dispense Refill  . acetaminophen (TYLENOL) 500 MG tablet Take 500 mg by mouth every 6 (six) hours as needed (for pain.).    Marland Kitchen carvedilol (COREG) 6.25 MG tablet Take 6.25 mg by mouth 2 (two) times daily with a meal.    . folic acid (FOLVITE) 1 MG tablet Take 1 mg by mouth daily.    . furosemide (LASIX) 40 MG tablet Take 20 mg by mouth daily.    Marland Kitchen KLOR-CON M20 20 MEQ tablet Take 20 mEq by mouth daily.  3  .  loperamide (IMODIUM) 2 MG capsule Take 2 mg by mouth daily.    . Magnesium Gluconate 250 MG TABS Take 250 mg by mouth daily as needed (cramps).     . methotrexate (RHEUMATREX) 2.5 MG tablet Take 20 mg by mouth every Sunday. Caution:Chemotherapy. Protect from light.    . Misc Natural Products (OSTEO BI-FLEX ADV JOINT SHIELD PO) Take 1 tablet by mouth daily.     . Multiple Vitamins-Minerals (CENTRUM SILVER PO) Take 1 tablet by mouth daily.    Marland Kitchen NOVOLOG MIX 70/30 FLEXPEN (70-30) 100 UNIT/ML FlexPen Inject 20-30 Units into the skin 2 (two) times daily with a meal. Take 30 units in the am and Take 20 units in the evening  3  . omeprazole (PRILOSEC) 40 MG capsule Take 40 mg by mouth daily.   3  . rOPINIRole (REQUIP) 1 MG tablet Take 1 mg by mouth every morning. MAY TAKE 2 ADDITIONAL DOSES  AS NEEDED    . XARELTO 10 MG TABS tablet TAKE 1 TABLET BY MOUTH DAILY WITH SUPPER (Patient taking differently: Take 10 mg by mouth daily.) 90 tablet 3   No current facility-administered medications for this visit.   Facility-Administered Medications Ordered in Other Visits  Medication Dose Route Frequency Provider Last Rate Last Admin  . 0.9 %  sodium chloride infusion   Intravenous Continuous Holley Bouche, NP   Stopped at 09/14/17 1440    ALLERGIES:  Allergies   Allergen Reactions  . Adhesive [Tape]     Adhesive tape and band-aids cause skin irritation  . Sulfa Antibiotics Nausea Only and Other (See Comments)    Joint paint  . Erythromycin Itching and Rash    burning    PHYSICAL EXAM:  Performance status (ECOG): 2 - Symptomatic, <50% confined to bed  Vitals:   11/21/20 1423  BP: (!) 143/56  Pulse: 72  Resp: 18  Temp: (!) 96.9 F (36.1 C)  SpO2: 100%   Wt Readings from Last 3 Encounters:  11/21/20 227 lb 12.8 oz (103.3 kg)  07/25/19 261 lb 3.9 oz (118.5 kg)  06/02/19 254 lb 4 oz (115.3 kg)   Physical Exam Vitals reviewed.  Constitutional:      Appearance: Normal appearance. She is obese.  Cardiovascular:     Rate and Rhythm: Normal rate and regular rhythm.     Pulses: Normal pulses.     Heart sounds: Normal heart sounds.  Pulmonary:     Effort: Pulmonary effort is normal.     Breath sounds: Normal breath sounds.  Abdominal:     Palpations: Abdomen is soft. There is hepatomegaly (liver border palpable upon inspiration). There is no splenomegaly or mass.     Tenderness: There is abdominal tenderness in the right upper quadrant.     Hernia: No hernia is present.  Neurological:     General: No focal deficit present.     Mental Status: She is alert and oriented to person, place, and time.  Psychiatric:        Mood and Affect: Mood normal.        Behavior: Behavior normal.      LABORATORY DATA:  I have reviewed the labs as listed.  CBC Latest Ref Rng & Units 11/21/2020 07/25/2019 05/11/2019  WBC 4.0 - 10.5 K/uL 9.4 8.7 11.4(H)  Hemoglobin 12.0 - 15.0 g/dL 13.6 12.5 13.4  Hematocrit 36.0 - 46.0 % 43.2 39.8 40.7  Platelets 150 - 400 K/uL 189 234 255   CMP Latest Ref Rng &  Units 11/21/2020 10/23/2020 09/19/2020  Glucose 70 - 99 mg/dL 92 224(H) -  BUN 8 - 23 mg/dL 19 19 18   Creatinine 0.44 - 1.00 mg/dL 1.12(H) 1.04(H) -  Sodium 135 - 145 mmol/L 140 139 -  Potassium 3.5 - 5.1 mmol/L 5.0 4.3 -  Chloride 98 - 111 mmol/L 104 105 -   CO2 22 - 32 mmol/L 29 25 -  Calcium 8.9 - 10.3 mg/dL 9.3 9.4 -  Total Protein 6.5 - 8.1 g/dL 7.1 6.5 -  Total Bilirubin 0.3 - 1.2 mg/dL 0.7 0.9 -  Alkaline Phos 38 - 126 U/L 41 41 -  AST 15 - 41 U/L 25 26 -  ALT 0 - 44 U/L 23 25 -   Lab Results  Component Value Date   CEA1 4.5 03/29/2019   CEA1 4.7 12/20/2018   CEA1 4.6 09/16/2018    DIAGNOSTIC IMAGING:  I have independently reviewed the scans and discussed with the patient. CT ABDOMEN PELVIS W WO CONTRAST  Result Date: 10/26/2020 CLINICAL DATA:  Ascending colon cancer status post right hemicolectomy in 2018. Hepatocellular carcinoma diagnosed September 2020 status post bland embolization 07/25/2019, with findings concerning for multifocal liver recurrence on recent MRI. Restaging. EXAM: CT CHEST WITH CONTRAST CT ABDOMEN AND PELVIS WITH AND WITHOUT CONTRAST TECHNIQUE: Multidetector CT imaging of the chest was performed during intravenous contrast administration. Multidetector CT imaging of the abdomen and pelvis was performed following the standard protocol before and during bolus administration of intravenous contrast. CONTRAST:  157mL OMNIPAQUE IOHEXOL 300 MG/ML  SOLN COMPARISON:  09/21/2020 MRI abdomen. 05/31/2019 chest CT. 03/29/2019 CT abdomen/pelvis. 07/01/2019 CT angiogram of the abdomen and pelvis. FINDINGS: CT CHEST FINDINGS Cardiovascular: Normal heart size. No significant pericardial effusion/thickening. Left anterior descending coronary atherosclerosis. Atherosclerotic nonaneurysmal thoracic aorta. Normal caliber pulmonary arteries. Incidental acute lobar, segmental and subsegmental pulmonary embolism in the right lower lobe (series 4/image 43). Mediastinum/Nodes: Mild multinodular goiter with dominant heterogeneous hypodense 1.8 cm right thyroid nodule, decreased from 2.2 cm on prior chest CT. Unremarkable esophagus. No pathologically enlarged axillary, mediastinal or hilar lymph nodes. Lungs/Pleura: No pneumothorax. No pleural  effusion. No acute consolidative airspace disease or lung masses. Peripheral right middle lobe 6 mm solid pulmonary nodule (series 14/image 60) and 2 mm posterior left upper lobe nodule (series 14/image 41) are both stable and considered benign. No new significant pulmonary nodules. Moderate patchy subpleural reticulation and ground-glass opacity in both lungs, with a basilar predominance, similar to mildly increased. No frank honeycombing. Musculoskeletal: No aggressive appearing focal osseous lesions. Marked thoracic spondylosis. CT ABDOMEN AND PELVIS FINDINGS Hepatobiliary: Segment 3 left liver 1.8 x 1.4 cm mass with arterial hyperenhancement (series 3/image 27), new from prior CT, increased from 1.2 x 1.2 cm on 09/21/2020 MRI. Far inferior right liver 1.0 x 0.9 cm hyperenhancing lesion (series 3/image 55), new from prior CT, increased from 0.8 x 0.7 cm on 09/21/2020 MRI. Recurrent hyperenhancing tumor at the treated tumor site in the central liver, now measuring 6.0 x 5.0 cm with extension into the caudate lobe (series 3/image 24). Cholecystectomy. No biliary ductal dilatation. Pancreas: Normal, with no mass or duct dilation. Spleen: Normal size. No mass. Adrenals/Urinary Tract: No discrete adrenal nodules. No hydronephrosis. No renal stones. No renal masses. Normal bladder. Stomach/Bowel: Normal non-distended stomach. Stable postsurgical changes from right hemicolectomy with ileocolic anastomosis in the upper abdomen. No dilated or thick-walled small bowel loops. Mild sigmoid diverticulosis with no large bowel wall thickening or significant pericolonic fat stranding in the remnant large-bowel.  Vascular/Lymphatic: Atherosclerotic nonaneurysmal abdominal aorta. Patent portal, splenic and renal veins. Possible tumor invasion of the middle hepatic vein (series 4/image 74), potentially extending into the anterior aspect of the intrahepatic IVC. No pathologically enlarged lymph nodes in the abdomen or pelvis.  Reproductive: Status post hysterectomy, with no abnormal findings at the vaginal cuff. No adnexal mass. Other: No pneumoperitoneum, ascites or focal fluid collection. Musculoskeletal: No aggressive appearing focal osseous lesions. Mild lumbar spondylosis. IMPRESSION: 1. Incidental acute right lower lobe pulmonary embolism, extending to the lobar level. 2. No evidence of metastatic disease in the chest. 3. Recurrent hypervascular tumor at the site of the treated central liver neoplasm, no measuring 6.0 x 5.0 cm, extending into the caudate lobe, with possible tumor invasion of the middle hepatic vein and potential extension into the anterior aspect of the intrahepatic IVC. 4. Hypervascular segment 3 left liver and far inferior right liver metastases, enlarging since 09/21/2020 MRI. 5. No additional sites of metastatic disease in the abdomen or pelvis. 6. Mild multinodular goiter. Dominant 1.8 cm right thyroid nodule, decreased. In the setting of significant comorbidities or limited life expectancy, no follow-up recommended (ref: J Am Coll Radiol. 2015 Feb;12(2): 143-50). 7. Spectrum of findings potentially indicating basilar predominant interstitial lung disease, similar to slightly worsened. 8. Aortic Atherosclerosis (ICD10-I70.0). Critical Value/emergent results were called by telephone at the time of interpretation on 10/26/2020 at 3:47 pm to provider [facility] , who verbally acknowledged these results. Electronically Signed   By: Ilona Sorrel M.D.   On: 10/26/2020 16:10   CT CHEST W CONTRAST  Result Date: 10/26/2020 CLINICAL DATA:  Ascending colon cancer status post right hemicolectomy in 2018. Hepatocellular carcinoma diagnosed September 2020 status post bland embolization 07/25/2019, with findings concerning for multifocal liver recurrence on recent MRI. Restaging. EXAM: CT CHEST WITH CONTRAST CT ABDOMEN AND PELVIS WITH AND WITHOUT CONTRAST TECHNIQUE: Multidetector CT imaging of the chest was performed  during intravenous contrast administration. Multidetector CT imaging of the abdomen and pelvis was performed following the standard protocol before and during bolus administration of intravenous contrast. CONTRAST:  117mL OMNIPAQUE IOHEXOL 300 MG/ML  SOLN COMPARISON:  09/21/2020 MRI abdomen. 05/31/2019 chest CT. 03/29/2019 CT abdomen/pelvis. 07/01/2019 CT angiogram of the abdomen and pelvis. FINDINGS: CT CHEST FINDINGS Cardiovascular: Normal heart size. No significant pericardial effusion/thickening. Left anterior descending coronary atherosclerosis. Atherosclerotic nonaneurysmal thoracic aorta. Normal caliber pulmonary arteries. Incidental acute lobar, segmental and subsegmental pulmonary embolism in the right lower lobe (series 4/image 43). Mediastinum/Nodes: Mild multinodular goiter with dominant heterogeneous hypodense 1.8 cm right thyroid nodule, decreased from 2.2 cm on prior chest CT. Unremarkable esophagus. No pathologically enlarged axillary, mediastinal or hilar lymph nodes. Lungs/Pleura: No pneumothorax. No pleural effusion. No acute consolidative airspace disease or lung masses. Peripheral right middle lobe 6 mm solid pulmonary nodule (series 14/image 60) and 2 mm posterior left upper lobe nodule (series 14/image 41) are both stable and considered benign. No new significant pulmonary nodules. Moderate patchy subpleural reticulation and ground-glass opacity in both lungs, with a basilar predominance, similar to mildly increased. No frank honeycombing. Musculoskeletal: No aggressive appearing focal osseous lesions. Marked thoracic spondylosis. CT ABDOMEN AND PELVIS FINDINGS Hepatobiliary: Segment 3 left liver 1.8 x 1.4 cm mass with arterial hyperenhancement (series 3/image 27), new from prior CT, increased from 1.2 x 1.2 cm on 09/21/2020 MRI. Far inferior right liver 1.0 x 0.9 cm hyperenhancing lesion (series 3/image 55), new from prior CT, increased from 0.8 x 0.7 cm on 09/21/2020 MRI. Recurrent  hyperenhancing tumor at  the treated tumor site in the central liver, now measuring 6.0 x 5.0 cm with extension into the caudate lobe (series 3/image 24). Cholecystectomy. No biliary ductal dilatation. Pancreas: Normal, with no mass or duct dilation. Spleen: Normal size. No mass. Adrenals/Urinary Tract: No discrete adrenal nodules. No hydronephrosis. No renal stones. No renal masses. Normal bladder. Stomach/Bowel: Normal non-distended stomach. Stable postsurgical changes from right hemicolectomy with ileocolic anastomosis in the upper abdomen. No dilated or thick-walled small bowel loops. Mild sigmoid diverticulosis with no large bowel wall thickening or significant pericolonic fat stranding in the remnant large-bowel. Vascular/Lymphatic: Atherosclerotic nonaneurysmal abdominal aorta. Patent portal, splenic and renal veins. Possible tumor invasion of the middle hepatic vein (series 4/image 74), potentially extending into the anterior aspect of the intrahepatic IVC. No pathologically enlarged lymph nodes in the abdomen or pelvis. Reproductive: Status post hysterectomy, with no abnormal findings at the vaginal cuff. No adnexal mass. Other: No pneumoperitoneum, ascites or focal fluid collection. Musculoskeletal: No aggressive appearing focal osseous lesions. Mild lumbar spondylosis. IMPRESSION: 1. Incidental acute right lower lobe pulmonary embolism, extending to the lobar level. 2. No evidence of metastatic disease in the chest. 3. Recurrent hypervascular tumor at the site of the treated central liver neoplasm, no measuring 6.0 x 5.0 cm, extending into the caudate lobe, with possible tumor invasion of the middle hepatic vein and potential extension into the anterior aspect of the intrahepatic IVC. 4. Hypervascular segment 3 left liver and far inferior right liver metastases, enlarging since 09/21/2020 MRI. 5. No additional sites of metastatic disease in the abdomen or pelvis. 6. Mild multinodular goiter. Dominant 1.8  cm right thyroid nodule, decreased. In the setting of significant comorbidities or limited life expectancy, no follow-up recommended (ref: J Am Coll Radiol. 2015 Feb;12(2): 143-50). 7. Spectrum of findings potentially indicating basilar predominant interstitial lung disease, similar to slightly worsened. 8. Aortic Atherosclerosis (ICD10-I70.0). Critical Value/emergent results were called by telephone at the time of interpretation on 10/26/2020 at 3:47 pm to provider [facility] , who verbally acknowledged these results. Electronically Signed   By: Ilona Sorrel M.D.   On: 10/26/2020 16:10   IR Radiologist Eval & Mgmt  Result Date: 10/31/2020 Please refer to notes tab for details about interventional procedure. (Op Note)    ASSESSMENT:  1.  Hepatocellular carcinoma: -MRI of the abdomen with Eovist on 04/21/2019 showed hypoenhancing lesion in the liver, which was not typical for [facility].  It was not significantly hypermetabolic on prior PET/CT.  This lesion has been gradually increasing since 10/08/2016. - AFP was normal at 3.9. -Biopsy on 05/11/2019 shows well to moderately differentiated hepatocellular carcinoma, steatohepatitic variant. -CT chest on 05/30/2019 was negative for metastatic disease. -Bland embolization of the hepatic lesion on 07/25/2019.  2.  Stage IIa (CT3CN0) colon cancer: -Right hemicolectomy on 10/10/2016, grade 2 adenocarcinoma, positive LVSI, margins negative, 0/19 lymph nodes positive. -Because of positive LVSI, she was recommended Xarelto.  She could not tolerate more than 2 cycles. -PET scan on 02/23/2018- for metastatic disease.  Liver mass was also not seen. -CEA on 03/29/2019 was 4.5. -CT [facility] on 03/29/2019 showed mass of the liver measuring 5.2 cm, previously 4.8 cm.  3.  Diarrhea: -She has watery diarrhea up to once per day.  She takes Imodium 1-2 tablets/day which helps.  She had this diarrhea since surgery.  4.  Unprovoked PE: -Incidental PE on CT chest in August 2018.  She  was treated with Xarelto 20 mg for a year. -Dose was reduced to 10 mg  daily in October 2019.  She is tolerating it without any problems.    PLAN:  1.  Hepatocellular carcinoma: -Plan embolization of the central hepatic lesion on 07/25/2019. -MRI of the abdomen with and without contrast on 09/21/2020 showed previous treated lesion in the central aspect of the liver measures 4.4 x 3.2 cm, previously 3.1 x 2.6 cm.  Hypervascular left liver lobe lesion measuring 1.3 x 1.1 cm.  6 mm hypervascular focus noted in the inferior aspect of the right lobe of the liver. -CTAP with and without contrast on 10/25/2020 showed left liver lesion measuring 1.8 x 1.4 cm, for inferior right liver lesion measuring 1 x 0.9 cm which are new.  Recurrent hyperenhancing tumor at the treated tumor site in the central liver, measuring 6 x 5 cm with extension into the caudate lobe.  No evidence of metastatic disease in the chest. -She is scheduled for biopsy of the liver lesion on 11/29/2020. -RTC after biopsy to discuss further plan.  Y 90 treatment is a possible option if [place] confirmed.  2.  Stage IIa (CT3CN0) colon cancer: -No change in bowel habits.  No bleeding per rectum.  We have sent a CEA level today. -CTAP did not show any other evidence of recurrence other than to new liver lesions. -To differentiate between colon cancer and liver cancer, we are proceeding with biopsy of the new left lower lobe lesion.  3.  Unprovoked PE: -CTAP on 10/25/2020 showed incidental acute right lower lobe pulmonary embolism. -Patient was not taking Xarelto on a regular basis prior to CT scan.  She was on 10 mg daily and stopped it secondary to bruising.  She is currently taking 10 mg daily.  Reports dyspnea on exertion. -Recommend increase Xarelto to 20 mg daily.  New prescription was sent.  4.  Diarrhea: -Continue Imodium 1 to 2 tablets daily.     Orders placed this encounter:  No orders of the defined types were placed in this  encounter.    Derek Jack, MD [place] [phone]   I, Milinda Antis, am acting as a scribe for Dr. Sanda Linger.  I, Derek Jack MD, have reviewed the above documentation for accuracy and completeness, and I agree with the above.

## 2020-11-22 LAB — CEA: CEA: 4.1 ng/mL (ref 0.0–4.7)

## 2020-11-28 ENCOUNTER — Other Ambulatory Visit: Payer: Self-pay | Admitting: Radiology

## 2020-11-29 ENCOUNTER — Observation Stay (HOSPITAL_COMMUNITY)
Admission: RE | Admit: 2020-11-29 | Discharge: 2020-11-29 | Disposition: A | Discharge status: Home / Self Care | Payer: Medicare Other | Source: Ambulatory Visit | Attending: Interventional Radiology | Admitting: Interventional Radiology

## 2020-11-29 ENCOUNTER — Other Ambulatory Visit: Payer: Self-pay

## 2020-11-29 ENCOUNTER — Inpatient Hospital Stay (HOSPITAL_COMMUNITY)
Admission: AD | Admit: 2020-11-29 | Discharge: 2020-12-04 | DRG: 920 | Disposition: A | Discharge status: Home / Self Care | Payer: Medicare Other | Source: Ambulatory Visit | Attending: Interventional Radiology | Admitting: Interventional Radiology

## 2020-11-29 DIAGNOSIS — Z96651 Presence of right artificial knee joint: Secondary | ICD-10-CM | POA: Diagnosis present

## 2020-11-29 DIAGNOSIS — G2581 Restless legs syndrome: Secondary | ICD-10-CM | POA: Diagnosis present

## 2020-11-29 DIAGNOSIS — K769 Liver disease, unspecified: Secondary | ICD-10-CM | POA: Diagnosis present

## 2020-11-29 DIAGNOSIS — K91870 Postprocedural hematoma of a digestive system organ or structure following a digestive system procedure: Principal | ICD-10-CM | POA: Diagnosis present

## 2020-11-29 DIAGNOSIS — N183 Chronic kidney disease, stage 3 unspecified: Secondary | ICD-10-CM

## 2020-11-29 DIAGNOSIS — Z90710 Acquired absence of both cervix and uterus: Secondary | ICD-10-CM

## 2020-11-29 DIAGNOSIS — I82511 Chronic embolism and thrombosis of right femoral vein: Secondary | ICD-10-CM | POA: Diagnosis present

## 2020-11-29 DIAGNOSIS — Z86711 Personal history of pulmonary embolism: Secondary | ICD-10-CM

## 2020-11-29 DIAGNOSIS — Z882 Allergy status to sulfonamides status: Secondary | ICD-10-CM

## 2020-11-29 DIAGNOSIS — Z881 Allergy status to other antibiotic agents status: Secondary | ICD-10-CM

## 2020-11-29 DIAGNOSIS — Z87891 Personal history of nicotine dependence: Secondary | ICD-10-CM

## 2020-11-29 DIAGNOSIS — D62 Acute posthemorrhagic anemia: Secondary | ICD-10-CM | POA: Diagnosis not present

## 2020-11-29 DIAGNOSIS — N179 Acute kidney failure, unspecified: Secondary | ICD-10-CM | POA: Diagnosis not present

## 2020-11-29 DIAGNOSIS — M797 Fibromyalgia: Secondary | ICD-10-CM | POA: Diagnosis present

## 2020-11-29 DIAGNOSIS — Z20822 Contact with and (suspected) exposure to covid-19: Secondary | ICD-10-CM | POA: Diagnosis present

## 2020-11-29 DIAGNOSIS — K219 Gastro-esophageal reflux disease without esophagitis: Secondary | ICD-10-CM | POA: Diagnosis present

## 2020-11-29 DIAGNOSIS — Y848 Other medical procedures as the cause of abnormal reaction of the patient, or of later complication, without mention of misadventure at the time of the procedure: Secondary | ICD-10-CM | POA: Diagnosis present

## 2020-11-29 DIAGNOSIS — R031 Nonspecific low blood-pressure reading: Secondary | ICD-10-CM | POA: Diagnosis present

## 2020-11-29 DIAGNOSIS — I82401 Acute embolism and thrombosis of unspecified deep veins of right lower extremity: Secondary | ICD-10-CM

## 2020-11-29 DIAGNOSIS — I129 Hypertensive chronic kidney disease with stage 1 through stage 4 chronic kidney disease, or unspecified chronic kidney disease: Secondary | ICD-10-CM | POA: Diagnosis present

## 2020-11-29 DIAGNOSIS — M069 Rheumatoid arthritis, unspecified: Secondary | ICD-10-CM | POA: Diagnosis present

## 2020-11-29 DIAGNOSIS — Z9049 Acquired absence of other specified parts of digestive tract: Secondary | ICD-10-CM

## 2020-11-29 DIAGNOSIS — E119 Type 2 diabetes mellitus without complications: Secondary | ICD-10-CM

## 2020-11-29 DIAGNOSIS — Z85828 Personal history of other malignant neoplasm of skin: Secondary | ICD-10-CM

## 2020-11-29 DIAGNOSIS — Z7901 Long term (current) use of anticoagulants: Secondary | ICD-10-CM

## 2020-11-29 DIAGNOSIS — C189 Malignant neoplasm of colon, unspecified: Secondary | ICD-10-CM | POA: Diagnosis present

## 2020-11-29 DIAGNOSIS — E1122 Type 2 diabetes mellitus with diabetic chronic kidney disease: Secondary | ICD-10-CM | POA: Diagnosis present

## 2020-11-29 DIAGNOSIS — R58 Hemorrhage, not elsewhere classified: Secondary | ICD-10-CM | POA: Diagnosis present

## 2020-11-29 DIAGNOSIS — C220 Liver cell carcinoma: Secondary | ICD-10-CM | POA: Diagnosis present

## 2020-11-29 DIAGNOSIS — Z6841 Body Mass Index (BMI) 40.0 and over, adult: Secondary | ICD-10-CM

## 2020-11-29 DIAGNOSIS — K529 Noninfective gastroenteritis and colitis, unspecified: Secondary | ICD-10-CM | POA: Diagnosis present

## 2020-11-29 DIAGNOSIS — Z79899 Other long term (current) drug therapy: Secondary | ICD-10-CM

## 2020-11-29 DIAGNOSIS — E785 Hyperlipidemia, unspecified: Secondary | ICD-10-CM | POA: Diagnosis present

## 2020-11-29 DIAGNOSIS — Y92239 Unspecified place in hospital as the place of occurrence of the external cause: Secondary | ICD-10-CM | POA: Diagnosis present

## 2020-11-29 DIAGNOSIS — N1831 Chronic kidney disease, stage 3a: Secondary | ICD-10-CM | POA: Diagnosis present

## 2020-11-29 DIAGNOSIS — Z888 Allergy status to other drugs, medicaments and biological substances status: Secondary | ICD-10-CM

## 2020-11-29 DIAGNOSIS — Z85038 Personal history of other malignant neoplasm of large intestine: Secondary | ICD-10-CM

## 2020-11-29 DIAGNOSIS — I10 Essential (primary) hypertension: Secondary | ICD-10-CM | POA: Diagnosis present

## 2020-11-29 LAB — GLUCOSE, CAPILLARY
Glucose-Capillary: 72 mg/dL (ref 70–99)
Glucose-Capillary: 82 mg/dL (ref 70–99)
Glucose-Capillary: 185 mg/dL — ABNORMAL HIGH (ref 70–99)
Glucose-Capillary: 199 mg/dL — ABNORMAL HIGH (ref 70–99)

## 2020-11-29 LAB — CBC
HCT: 42 % (ref 36.0–46.0)
Hemoglobin: 13.5 g/dL (ref 12.0–15.0)
MCH: 31.2 pg (ref 26.0–34.0)
MCHC: 32.1 g/dL (ref 30.0–36.0)
MCV: 97 fL (ref 80.0–100.0)
Platelets: 200 10*3/uL (ref 150–400)
RBC: 4.33 MIL/uL (ref 3.87–5.11)
RDW: 13.2 % (ref 11.5–15.5)
WBC: 9.2 10*3/uL (ref 4.0–10.5)
nRBC: 0 % (ref 0.0–0.2)

## 2020-11-29 LAB — PROTIME-INR
INR: 1 (ref 0.8–1.2)
Prothrombin Time: 13.3 seconds (ref 11.4–15.2)

## 2020-11-29 LAB — HEMOGLOBIN A1C
Hgb A1c MFr Bld: 6.5 % — ABNORMAL HIGH (ref 4.8–5.6)
Mean Plasma Glucose: 139.85 mg/dL

## 2020-11-29 MED ORDER — SODIUM CHLORIDE 0.9 % IV SOLN: INTRAVENOUS | Status: DC

## 2020-11-29 MED ORDER — LOPERAMIDE HCL 2 MG PO CAPS
2.0000 mg | ORAL_CAPSULE | Freq: Every day | ORAL | Status: DC
  Administered 2020-11-30 – 2020-12-01 (×2): 2 mg via ORAL
  Filled 2020-11-29 (×2): qty 1

## 2020-11-29 MED ORDER — SALINE SPRAY 0.65 % NA SOLN
1.0000 | Freq: Every day | NASAL | Status: DC | PRN
  Filled 2020-11-29: qty 44

## 2020-11-29 MED ORDER — DOCUSATE SODIUM 100 MG PO CAPS
100.0000 mg | ORAL_CAPSULE | Freq: Two times a day (BID) | ORAL | Status: DC
  Filled 2020-11-29 (×4): qty 1

## 2020-11-29 MED ORDER — PANTOPRAZOLE SODIUM 40 MG PO TBEC
40.0000 mg | DELAYED_RELEASE_TABLET | Freq: Every day | ORAL | Status: DC
  Administered 2020-11-30 – 2020-12-04 (×5): 40 mg via ORAL
  Filled 2020-11-29 (×5): qty 1

## 2020-11-29 MED ORDER — MIDAZOLAM HCL 2 MG/2ML IJ SOLN
INTRAMUSCULAR | Status: AC | PRN
  Administered 2020-11-29: 1 mg via INTRAVENOUS

## 2020-11-29 MED ORDER — FENTANYL CITRATE (PF) 100 MCG/2ML IJ SOLN
INTRAMUSCULAR | Status: AC
  Filled 2020-11-29: qty 2

## 2020-11-29 MED ORDER — ROPINIROLE HCL 1 MG PO TABS
1.0000 mg | ORAL_TABLET | ORAL | Status: DC
  Administered 2020-11-30 – 2020-12-04 (×5): 1 mg via ORAL
  Filled 2020-11-29 (×5): qty 1

## 2020-11-29 MED ORDER — INSULIN ASPART 100 UNIT/ML ~~LOC~~ SOLN
4.0000 [IU] | Freq: Three times a day (TID) | SUBCUTANEOUS | Status: DC
  Administered 2020-11-30 – 2020-12-04 (×11): 4 [IU] via SUBCUTANEOUS

## 2020-11-29 MED ORDER — METHOTREXATE 2.5 MG PO TABS
20.0000 mg | ORAL_TABLET | ORAL | Status: DC
  Administered 2020-12-02: 20 mg via ORAL
  Filled 2020-11-29: qty 8

## 2020-11-29 MED ORDER — HYDROCODONE-ACETAMINOPHEN 5-325 MG PO TABS
1.0000 | ORAL_TABLET | ORAL | Status: DC | PRN
  Administered 2020-11-29: 2 via ORAL
  Filled 2020-11-29: qty 2

## 2020-11-29 MED ORDER — GELATIN ABSORBABLE 12-7 MM EX MISC
CUTANEOUS | Status: AC
  Filled 2020-11-29: qty 1

## 2020-11-29 MED ORDER — FOLIC ACID 1 MG PO TABS
1.0000 mg | ORAL_TABLET | Freq: Every day | ORAL | Status: DC
  Administered 2020-11-30 – 2020-12-04 (×5): 1 mg via ORAL
  Filled 2020-11-29 (×5): qty 1

## 2020-11-29 MED ORDER — INSULIN ASPART 100 UNIT/ML ~~LOC~~ SOLN
0.0000 [IU] | Freq: Three times a day (TID) | SUBCUTANEOUS | Status: DC
  Administered 2020-11-30 (×2): 3 [IU] via SUBCUTANEOUS
  Administered 2020-11-30: 5 [IU] via SUBCUTANEOUS
  Administered 2020-12-01: 2 [IU] via SUBCUTANEOUS
  Administered 2020-12-01: 3 [IU] via SUBCUTANEOUS
  Administered 2020-12-01: 5 [IU] via SUBCUTANEOUS
  Administered 2020-12-02 (×3): 3 [IU] via SUBCUTANEOUS
  Administered 2020-12-03: 2 [IU] via SUBCUTANEOUS
  Administered 2020-12-03: 3 [IU] via SUBCUTANEOUS
  Administered 2020-12-03: 5 [IU] via SUBCUTANEOUS
  Administered 2020-12-04: 3 [IU] via SUBCUTANEOUS

## 2020-11-29 MED ORDER — ACETAMINOPHEN 500 MG PO TABS: 500.0000 mg | ORAL_TABLET | Freq: Four times a day (QID) | ORAL | Status: DC | PRN

## 2020-11-29 MED ORDER — INSULIN ASPART 100 UNIT/ML ~~LOC~~ SOLN
0.0000 [IU] | Freq: Every day | SUBCUTANEOUS | Status: DC
  Administered 2020-12-03: 2 [IU] via SUBCUTANEOUS

## 2020-11-29 MED ORDER — DEXTROSE 50 % IV SOLN: 25.0000 mL | Freq: Once | INTRAVENOUS | Status: AC

## 2020-11-29 MED ORDER — FENTANYL CITRATE (PF) 100 MCG/2ML IJ SOLN
INTRAMUSCULAR | Status: AC | PRN
  Administered 2020-11-29: 50 ug via INTRAVENOUS

## 2020-11-29 MED ORDER — DEXTROSE 50 % IV SOLN
INTRAVENOUS | Status: AC
  Administered 2020-11-29: 25 mL via INTRAVENOUS
  Filled 2020-11-29: qty 50

## 2020-11-29 MED ORDER — ONDANSETRON HCL 4 MG/2ML IJ SOLN: 4.0000 mg | Freq: Four times a day (QID) | INTRAMUSCULAR | Status: DC | PRN

## 2020-11-29 MED ORDER — FUROSEMIDE 20 MG PO TABS: 20.0000 mg | ORAL_TABLET | Freq: Every day | ORAL | Status: DC | PRN

## 2020-11-29 MED ORDER — LIDOCAINE-EPINEPHRINE 1 %-1:100000 IJ SOLN
INTRAMUSCULAR | Status: AC
  Filled 2020-11-29: qty 1

## 2020-11-29 MED ORDER — CARVEDILOL 6.25 MG PO TABS
6.2500 mg | ORAL_TABLET | Freq: Two times a day (BID) | ORAL | Status: DC
  Administered 2020-12-01: 6.25 mg via ORAL
  Filled 2020-11-29 (×2): qty 1

## 2020-11-29 MED ORDER — IOHEXOL 350 MG/ML SOLN
100.0000 mL | Freq: Once | INTRAVENOUS | Status: AC | PRN
  Administered 2020-11-29: 100 mL via INTRAVENOUS

## 2020-11-29 MED ORDER — MIDAZOLAM HCL 2 MG/2ML IJ SOLN
INTRAMUSCULAR | Status: AC
  Filled 2020-11-29: qty 2

## 2020-11-29 NOTE — H&P (Signed)
Chief Complaint: Patient was seen in consultation today for liver lesion biopsy.  Referring Physician(s): Firefighter  Supervising Physician: Sandi Mariscal  Patient Status: [facility] - Out-pt  History of Present Illness: Wanda Curry is a 75 y.o. female with a past medical history significant for RA, fibromyalgia, GERD, DM, HLD, colon cancer s/p right hemicolectomy (2018) and hepatocellular carcinoma s/p bland hepatic embolization 07/24/20 in IR who presents today for a liver lesion biopsy to further direct care. Wanda Curry has been followed regularly by oncology since 2018 and IR since her ablation in late 2021, her most recent CT abd/pelvis on 10/25/20 showed recurrent hypervascular tumor at the site of the treated central liver neoplasm now measuring 6.0 x 5.0 cm extending into the caudate lobe, with possible tumor invasion of the middle hepatic vein and potential extension into the anterior aspect of the intrahepatic IVC as well as enlargement of the previously seen hypervascular segment 3 left liver and far inferior right liver metastases. IR has been asked to perform a biopsy of the dominant lesion of the left lobe of the liver to further direct care.   Wanda Curry denies any complaints today, she is concerned about the spots on her liver and is ready to find out what can be done about them.   Past Medical History:  Diagnosis Date  . Anemia   . Arthritis    RA  . Cancer ([place])    skin cancer  . Depression   . Diabetes mellitus without complication ([place])   . Fibromyalgia   . GERD (gastroesophageal reflux disease)   . Heart murmur    ECHO scheduled 05-25-2015  . Hyperlipidemia   . Neuropathy   . PONV (postoperative nausea and vomiting)     Past Surgical History:  Procedure Laterality Date  . ABDOMINAL HYSTERECTOMY    . BIOPSY  09/23/2016   Procedure: BIOPSY;  Surgeon: Danie Binder, MD;  Location: [facility];  Service: Endoscopy;;  hepatic flexure mass  .  CHOLECYSTECTOMY    . COLONOSCOPY N/A 08/13/2017   Procedure: COLONOSCOPY;  Surgeon: Daneil Dolin, MD;  Location: [facility];  Service: Endoscopy;  Laterality: N/A;  . COLONOSCOPY WITH PROPOFOL N/A 09/23/2016   Procedure: COLONOSCOPY WITH PROPOFOL;  Surgeon: Danie Binder, MD;  [facility]: [facility];  Service: Endoscopy;  Laterality: N/A;  7:30 am  . HARDWARE REMOVAL Right 05/30/2015   Procedure: HARDWARE REMOVAL;  Surgeon: Ninetta Lights, MD;  Location: [place];  Service: Orthopedics;  Laterality: Right;  . IR ANGIOGRAM SELECTIVE EACH ADDITIONAL VESSEL  07/25/2019  . IR ANGIOGRAM SELECTIVE EACH ADDITIONAL VESSEL  07/25/2019  . IR ANGIOGRAM VISCERAL SELECTIVE  07/25/2019  . IR ANGIOGRAM VISCERAL SELECTIVE  07/25/2019  . IR EMBO TUMOR ORGAN ISCHEMIA INFARCT INC GUIDE ROADMAPPING  07/25/2019  . IR RADIOLOGIST EVAL & MGMT  06/09/2019  . IR RADIOLOGIST EVAL & MGMT  07/07/2019  . IR RADIOLOGIST EVAL & MGMT  01/05/2020  . IR RADIOLOGIST EVAL & MGMT  04/12/2020  . IR RADIOLOGIST EVAL & MGMT  09/27/2020  . IR RADIOLOGIST EVAL & MGMT  10/31/2020  . IR US GUIDE VASC ACCESS RIGHT  07/25/2019  . PARTIAL COLECTOMY N/A 10/10/2016   Procedure: PARTIAL COLECTOMY;  Surgeon: Aviva Signs, MD;  Location: [facility];  Service: General;  Laterality: N/A;  . planter warts Bilateral    both feet  . POLYPECTOMY  09/23/2016   Procedure: POLYPECTOMY;  Surgeon: Danie Binder, MD;  [facility];  Service: Endoscopy;;  transverse colon polyps times 2, rectal polyp  . skin grafts     due to planter warts  . TOTAL KNEE ARTHROPLASTY Right 05/30/2015   Procedure: RIGHT TOTAL KNEE ARTHROPLASTY;  Surgeon: Ninetta Lights, MD;  Location: [place];  Service: Orthopedics;  Laterality: Right;    Allergies: Adhesive [tape], Sulfa antibiotics, and Erythromycin  Medications: Prior to Admission medications   Medication Sig Start Date End Date Taking? Authorizing Provider  acetaminophen (TYLENOL) 500 MG tablet Take 500 mg by  mouth every 6 (six) hours as needed (for pain.).   Yes [provider]  APPLE CIDER VINEGAR PO Take 450 mg by mouth daily.   Yes [provider]  Calcium Carbonate-Vitamin D3 (CALCIUM 600/VITAMIN D) 600-400 MG-UNIT TABS Take 1 tablet by mouth daily.   Yes [provider]  carvedilol (COREG) 6.25 MG tablet Take 6.25 mg by mouth 2 (two) times daily with a meal.   Yes [provider]  folic acid (FOLVITE) 1 MG tablet Take 1 mg by mouth daily.   Yes [provider]  furosemide (LASIX) 40 MG tablet Take 20 mg by mouth daily as needed for edema. 07/24/19  Yes [provider]  loperamide (IMODIUM) 2 MG capsule Take 2 mg by mouth daily.   Yes [provider]  magnesium gluconate (MAGONATE) 500 MG tablet Take 500 mg by mouth daily as needed (cramps).   Yes [provider]  methotrexate (RHEUMATREX) 2.5 MG tablet Take 20 mg by mouth every Sunday. Caution:Chemotherapy. Protect from light.   Yes [provider]  Multiple Vitamins-Minerals (CENTRUM SILVER PO) Take 1 tablet by mouth daily.   Yes [provider]  NOVOLOG MIX 70/30 FLEXPEN (70-30) 100 UNIT/ML FlexPen Inject 20-30 Units into the skin See admin instructions. Take 30 units in the am and Take 20 units in the evening 09/14/16  Yes [provider]  omeprazole (PRILOSEC) 40 MG capsule Take 40 mg by mouth daily.  11/17/16  Yes [provider]  rivaroxaban (XARELTO) 20 MG TABS tablet Take 1 tablet (20 mg total) by mouth daily with supper. 11/21/20  Yes Derek Jack, MD  rOPINIRole (REQUIP) 1 MG tablet Take 1 mg by mouth every morning. MAY TAKE 2 ADDITIONAL DOSES  AS NEEDED   Yes [provider]  Saline (AYR SALINE NASAL DROPS) 0.65 % SOLN Place 1 spray into the nose daily as needed (congestion).   Yes [provider]  vitamin E 180 MG (400 UNITS) capsule Take 400 Units by mouth daily.   Yes [provider]     Family  History  Problem Relation Age of Onset  . Colon cancer Neg Hx     Social History   Socioeconomic History  . Marital status: Widowed    Spouse name: Not on file  . Number of children: Not on file  . Years of education: Not on file  . Highest education level: Not on file  Occupational History  . Not on file  Tobacco Use  . Smoking status: Former Smoker    Packs/day: 1.00    Years: 40.00    Pack years: 40.00    Types: Cigarettes    Quit date: 08/18/2004    Years since quitting: 16.2  . Smokeless tobacco: Never Used  Vaping Use  . Vaping Use: Never used  Substance and Sexual Activity  . Alcohol use: No  . Drug use: No  . Sexual activity: Not Currently  Birth control/protection: Surgical  Other Topics Concern  . Not on file  Social History Narrative  . Not on file   Social Determinants of Health   Financial Resource Strain: Not on file  Food Insecurity: Not on file  Transportation Needs: Not on file  Physical Activity: Not on file  Stress: Not on file  Social Connections: Not on file     Review of Systems: A 12 point ROS discussed and pertinent positives are indicated in the HPI above.  All other systems are negative.  Review of Systems  Constitutional: Negative for chills and fever.  Respiratory: Negative for cough and shortness of breath.   Gastrointestinal: Negative for abdominal pain, diarrhea, nausea and vomiting.  Skin: Negative for color change.  Neurological: Negative for dizziness and headaches.    Vital Signs: BP (!) 154/55   Pulse 70   Temp 97.9 F (36.6 C) (Oral)   Ht 5\' 3"  (1.6 m)   Wt 227 lb (103 kg)   SpO2 97%   BMI 40.21 kg/m   Physical Exam Vitals reviewed.  Constitutional:      General: She is not in acute distress. HENT:     Head: Normocephalic.     Mouth/Throat:     Mouth: Mucous membranes are moist.     Pharynx: Oropharynx is clear. No oropharyngeal exudate or posterior oropharyngeal erythema.  Eyes:     General: No scleral  icterus. Cardiovascular:     Rate and Rhythm: Normal rate and regular rhythm.  Pulmonary:     Effort: Pulmonary effort is normal.     Breath sounds: Normal breath sounds.  Abdominal:     General: There is no distension.     Palpations: Abdomen is soft. There is no mass.     Tenderness: There is no abdominal tenderness.  Skin:    General: Skin is warm and dry.     Coloration: Skin is not jaundiced.  Neurological:     Mental Status: She is alert and oriented to person, place, and time.      MD Evaluation Airway: WNL Heart: WNL Abdomen: WNL Chest/ Lungs: WNL ASA  Classification: 3 Mallampati/Airway Score: Two   Imaging: IR Radiologist Eval & Mgmt  Result Date: 10/31/2020 Please refer to notes tab for details about interventional procedure. (Op Note)   Labs:  CBC: Recent Labs    11/21/20 1249  WBC 9.4  HGB 13.6  HCT 43.2  PLT 189    COAGS: No results for input(s): INR, APTT in the last 8760 hours.  BMP: Recent Labs    12/26/19 1008 04/02/20 0909 09/19/20 1032 10/23/20 1004 11/21/20 1249  NA 139 139  --  139 140  K 3.8 4.8  --  4.3 5.0  CL 102 104  --  105 104  CO2 29 26  --  25 29  GLUCOSE 113* 198*  --  224* 92  BUN 23 20 18 19 19   CALCIUM 9.0 9.5  --  9.4 9.3  CREATININE 1.00 1.14*  --  1.04* 1.12*  GFRNONAA 55* 47*  --  56* 51*  GFRAA >60 54*  --   --   --     LIVER FUNCTION TESTS: Recent Labs    12/26/19 1008 04/02/20 0909 10/23/20 1004 11/21/20 1249  BILITOT 0.4 0.6 0.9 0.7  AST 21 29 26 25   ALT 28 30 25 23   ALKPHOS 46 46 41 41  PROT 6.8 6.8 6.5 7.1  ALBUMIN 3.6 3.8 3.6 3.6  TUMOR MARKERS: No results for input(s): AFPTM, CEA, CA199, CHROMGRNA in the last 8760 hours.  Assessment and Plan:  75 y/o F with history of colon cancer s/p hemicolectomy (2018) and [place] s/p bland embolization (07/24/20) in IR found to have enlarging liver lesions on most recent imaging who presents today for a biopsy of the dominate lesion within the left  lobe on the liver. Patient previously discussed with Dr. Delton Coombes by Dr. Pascal Lux who approves procedure.  Patient has been NPO since 0600, last dose of Xarelto 4/12. Afebrile, WBC 9/2, hgb 13.5, plt 200, INR 1.0.  Risks and benefits of liver lesion biopsy was discussed with the patient and/or patient's family including, but not limited to bleeding, infection, damage to adjacent structures or low yield requiring additional tests.  All of the questions were answered and there is agreement to proceed.  Consent signed and in chart.  Thank you for this interesting consult.  I greatly enjoyed meeting Wanda Curry and look forward to participating in their care.  A copy of this report was sent to the requesting provider on this date.  Electronically Signed: Joaquim Nam, PA-C 11/29/2020, 11:45 AM   I spent a total of  15 Minutes in face to face in clinical consultation, greater than 50% of which was counseling/coordinating care for liver lesion biopsy.

## 2020-11-29 NOTE — Procedures (Signed)
Pre Procedure Dx: Liver lesion Post Procedural Dx: Same  Technically successful US guided biopsy of indeterminate lesion within the left lobe of the liver.  EBL: None  No immediate complications.   Jay Cookie Pore, MD Pager #: [phone]    

## 2020-11-29 NOTE — Discharge Instructions (Signed)
Liver Biopsy, Care After RESTART XARELTO TOMORROW EVENING/ 4/15 UNLESS PT HAS UNCONTROLLED PAIN, THEN HOLD ONE MORE EVENING These instructions give you information on caring for yourself after your procedure. Your doctor may also give you more specific instructions. Call your doctor if you have any problems or questions after your procedure. What can I expect after the procedure? After the procedure, it is common to have:  Pain and soreness where the biopsy was done.  Bruising around the area where the biopsy was done.  Sleepiness and be tired for a few days. Follow these instructions at home: Medicines  Take over-the-counter and prescription medicines only as told by your doctor.  If you were prescribed an antibiotic medicine, take it as told by your doctor. Do not stop taking the antibiotic even if you start to feel better.  Do not take medicines such as aspirin and ibuprofen. These medicines can thin your blood. Do not take these medicines unless your doctor tells you to take them.  If you are taking prescription pain medicine, take actions to prevent or treat constipation. Your doctor may recommend that you: ? Drink enough fluid to keep your pee (urine) clear or pale yellow. ? Take over-the-counter or prescription medicines. ? Eat foods that are high in fiber, such as fresh fruits and vegetables, whole grains, and beans. ? Limit foods that are high in fat and processed sugars, such as fried and sweet foods. Caring for your cut  Follow instructions from your doctor about how to take care of your cuts from surgery (incisions). Make sure you: ? Wash your hands with soap and water before you change your bandage (dressing). If you cannot use soap and water, use hand sanitizer. ? Change your bandage as told by your doctor. ? Leave stitches (sutures), skin glue, or skin tape (adhesive) strips in place. They may need to stay in place for 2 weeks or longer. If tape strips get loose and curl  up, you may trim the loose edges. Do not remove tape strips completely unless your doctor says it is okay.  Check your cuts every day for signs of infection. Check for: ? Redness, swelling, or more pain. ? Fluid or blood. ? Pus or a bad smell. ? Warmth.  Do not take baths, swim, or use a hot tub until your doctor says it is okay to do so. Activity  Rest at home for 1-2 days or as told by your doctor. ? Avoid sitting for a long time without moving. Get up to take short walks every 1-2 hours.  Return to your normal activities as told by your doctor. Ask what activities are safe for you.  Do not do these things in the first 24 hours: ? Drive. ? Use machinery. ? Take a bath or shower.  Do not lift more than 10 pounds (4.5 kg) or play contact sports for the first 2 weeks.   General instructions  Do not drink alcohol in the first week after the procedure.  Have someone stay with you for at least 24 hours after the procedure.  Get your test results. Ask your doctor or the department that is doing the test: ? When will my results be ready? ? How will I get my results? ? What are my treatment options? ? What other tests do I need? ? What are my next steps?  Keep all follow-up visits as told by your doctor. This is important.   Contact a doctor if:  A cut bleeds  and leaves more than just a small spot of blood.  A cut is red, puffs up (swells), or hurts more than before.  Fluid or something else comes from a cut.  A cut smells bad.  You have a fever or chills. Get help right away if:  You have swelling, bloating, or pain in your belly (abdomen).  You get dizzy or faint.  You have a rash.  You feel sick to your stomach (nauseous) or throw up (vomit).  You have trouble breathing, feel short of breath, or feel faint.  Your chest hurts.  You have problems talking or seeing.  You have trouble with your balance or moving your arms or legs. Summary  After the  procedure, it is common to have pain, soreness, bruising, and tiredness.  Your doctor will tell you how to take care of yourself at home. Change your bandage, take your medicines, and limit your activities as told by your doctor.  Call your doctor if you have symptoms of infection. Get help right away if your belly swells, your cut bleeds a lot, or you have trouble talking or breathing. This information is not intended to replace advice given to you by your health care provider. Make sure you discuss any questions you have with your health care provider. Document Revised: 08/13/2017 Document Reviewed: 08/14/2017 Elsevier Patient Education  2021 [employer].

## 2020-11-29 NOTE — Progress Notes (Signed)
Per Dr Pascal Lux, Pt to restart xarelto 4/15 evening, if pain is uncontrolled pt to wait one more day, pt told and verbalized understanding.

## 2020-11-29 NOTE — Progress Notes (Signed)
Report called to nurse for  5-w-20; and transferred via stretcher

## 2020-11-29 NOTE — Progress Notes (Signed)
Client up to bathroom and c/o 10/10 epigastric pain, color pale, diaphoretic then decreased responsiveness and back to bed; bed in reverse trendelenburg and IV fluids to 999 for 250cc; Dr Pascal Lux notified and in to see client; client to go to ct scan

## 2020-11-30 LAB — CBC
HCT: 31.2 % — ABNORMAL LOW (ref 36.0–46.0)
Hemoglobin: 10.2 g/dL — ABNORMAL LOW (ref 12.0–15.0)
MCH: 31.2 pg (ref 26.0–34.0)
MCHC: 32.7 g/dL (ref 30.0–36.0)
MCV: 95.4 fL (ref 80.0–100.0)
Platelets: 170 10*3/uL (ref 150–400)
RBC: 3.27 MIL/uL — ABNORMAL LOW (ref 3.87–5.11)
RDW: 13.2 % (ref 11.5–15.5)
WBC: 10.1 10*3/uL (ref 4.0–10.5)
nRBC: 0 % (ref 0.0–0.2)

## 2020-11-30 LAB — CBC WITH DIFFERENTIAL/PLATELET
Abs Immature Granulocytes: 0.04 10*3/uL (ref 0.00–0.07)
Basophils Absolute: 0 10*3/uL (ref 0.0–0.1)
Basophils Relative: 0 %
Eosinophils Absolute: 0.1 10*3/uL (ref 0.0–0.5)
Eosinophils Relative: 1 %
HCT: 32 % — ABNORMAL LOW (ref 36.0–46.0)
Hemoglobin: 10.3 g/dL — ABNORMAL LOW (ref 12.0–15.0)
Immature Granulocytes: 0 %
Lymphocytes Relative: 18 %
Lymphs Abs: 1.8 10*3/uL (ref 0.7–4.0)
MCH: 30.8 pg (ref 26.0–34.0)
MCHC: 32.2 g/dL (ref 30.0–36.0)
MCV: 95.8 fL (ref 80.0–100.0)
Monocytes Absolute: 0.8 10*3/uL (ref 0.1–1.0)
Monocytes Relative: 8 %
Neutro Abs: 7.7 10*3/uL (ref 1.7–7.7)
Neutrophils Relative %: 73 %
Platelets: 172 10*3/uL (ref 150–400)
RBC: 3.34 MIL/uL — ABNORMAL LOW (ref 3.87–5.11)
RDW: 13.5 % (ref 11.5–15.5)
WBC: 10.5 10*3/uL (ref 4.0–10.5)
nRBC: 0 % (ref 0.0–0.2)

## 2020-11-30 LAB — GLUCOSE, CAPILLARY
Glucose-Capillary: 151 mg/dL — ABNORMAL HIGH (ref 70–99)
Glucose-Capillary: 168 mg/dL — ABNORMAL HIGH (ref 70–99)

## 2020-11-30 NOTE — Progress Notes (Signed)
Interventional Radiology Brief Note:  Patient assessed at bedside this afternoon.  Reports she felt weak when getting to the chair this afternoon.  RN performed orthostatic vital signs which were unrevealing.  Patients states she felt better once sitting.  Has not yet had lunch, awaiting tray.  RN to assist with this.  Patient to remain inpatient overnight for ongoing recovery and monitoring.  Will reassess for possible discharge tomorrow.  Vital signs remain stable at this time.   Check CBC in the AM.  This has been ordered.   Discussed plans for anticoagulation with Dr. Pascal Lux.  Recommends resuming Xarelto at half-dose Monday (20mg ) then contacting Dr. Tomie China office for plans to advance back to 40mg  daily.   Brynda Greathouse, MS RD PA-C 2:29 PM

## 2020-11-30 NOTE — Progress Notes (Signed)
   11/30/20 1134 11/30/20 1138  Vitals  BP (!) 121/43 (!) 113/42  MAP (mmHg) (!) 64 (!) 62  BP Location Left Arm Left Arm  BP Method Automatic Automatic  Patient Position (if appropriate) Lying Sitting  Pulse Rate 87 92  ECG Heart Rate 87 92  Resp 16 13  MEWS COLOR  MEWS Score Color Green Green  Oxygen Therapy  SpO2 95 % 94 %  O2 Device Room Air Room Air   Dizzy, feeling hot and nauseas with standing. Recovered after sitting in the chair.

## 2020-11-30 NOTE — Progress Notes (Signed)
Referring Physician(s): Katragadda,Sreedhar  Supervising Physician: Sandi Mariscal  Patient Status:  [facility] - In-pt  Chief Complaint: Liver lesion  Intraparenchymal hematomas s/p liver lesion biopsy 4/14  Subjective: Patient resting comfortably in bed this AM.   States her abdominal pain is improved.  Remains tender with palpation and movement.  Tolerating clears.   Has not been up OOB.  Daughter at bedside.   Allergies: Adhesive [tape], Neosporin original [bacitracin-neomycin-polymyxin], Sulfa antibiotics, and Erythromycin  Medications: Prior to Admission medications   Medication Sig Start Date End Date Taking? Authorizing Provider  acetaminophen (TYLENOL) 500 MG tablet Take 500 mg by mouth every 6 (six) hours as needed (for pain.).   Yes [provider]  APPLE CIDER VINEGAR PO Take 450 mg by mouth daily.   Yes [provider]  Calcium Carbonate-Vitamin D3 600-400 MG-UNIT TABS Take 1 tablet by mouth daily.   Yes [provider]  carvedilol (COREG) 6.25 MG tablet Take 6.25 mg by mouth 2 (two) times daily with a meal.   Yes [provider]  folic acid (FOLVITE) 1 MG tablet Take 1 mg by mouth daily.   Yes [provider]  furosemide (LASIX) 40 MG tablet Take 20 mg by mouth daily as needed for edema. 07/24/19  Yes [provider]  loperamide (IMODIUM) 2 MG capsule Take 2 mg by mouth daily.   Yes [provider]  magnesium gluconate (MAGONATE) 500 MG tablet Take 500 mg by mouth daily as needed (cramps).   Yes [provider]  methotrexate (RHEUMATREX) 2.5 MG tablet Take 20 mg by mouth every Sunday. Caution:Chemotherapy. Protect from light.   Yes [provider]  Multiple Vitamins-Minerals (CENTRUM SILVER PO) Take 1 tablet by mouth daily.   Yes [provider]  NOVOLOG MIX 70/30 FLEXPEN (70-30) 100 UNIT/ML FlexPen Inject 20-30 Units into the skin See admin instructions. Take 30 units in the am and  Take 20 units in the evening 09/14/16  Yes [provider]  omeprazole (PRILOSEC) 40 MG capsule Take 40 mg by mouth daily.  11/17/16  Yes [provider]  rivaroxaban (XARELTO) 20 MG TABS tablet Take 1 tablet (20 mg total) by mouth daily with supper. 11/21/20  Yes Derek Jack, MD  rOPINIRole (REQUIP) 1 MG tablet Take 1 mg by mouth every morning. MAY TAKE 2 ADDITIONAL DOSES  AS NEEDED   Yes [provider]  Saline (AYR SALINE NASAL DROPS) 0.65 % SOLN Place 1 spray into the nose daily as needed (congestion).   Yes [provider]  vitamin E 180 MG (400 UNITS) capsule Take 400 Units by mouth daily.   Yes [provider]     Vital Signs: BP (!) 113/42 (BP Location: Left Arm)   Pulse 92   Temp 98 F (36.7 C) (Oral)   Resp 13   Ht 5\' 3"  (1.6 m)   Wt 227 lb (103 kg)   SpO2 94%   BMI 40.21 kg/m   Physical Exam  NAD, alert Heart:  RRR Lungs: CTAB Abdomen: soft, tender with palpation in epigastric area.   Imaging: CT ABDOMEN PELVIS WO CONTRAST  Result Date: 11/30/2020 CLINICAL DATA:  Known history of colorectal cancer and hepatocellular carcinoma, now with acute onset of abdominal pain following liver lesion biopsy. EXAM: CT ABDOMEN AND PELVIS WITHOUT CONTRAST TECHNIQUE: Multidetector CT imaging of the abdomen and pelvis was performed following the standard protocol without IV contrast. COMPARISON:  CT abdomen and pelvis-10/25/2020; 09/16/2018 FINDINGS: The lack of intravenous  contrast limits the ability to evaluate solid abdominal organs. Lower chest: Limited visualization of the lower thorax demonstrates minimal dependent subpleural ground-glass atelectasis. Unchanged punctate (0.6 cm) sub pleural nodule within the right middle lobe is unchanged since at least the 08/2018 examination and thus of benign etiology. No discrete focal airspace opacities. No pleural effusion. Normal heart size. No pericardial effusion. Calcifications of the mitral  valve annulus. Hepatobiliary: Normal hepatic contour. Known infiltrative mass within the central aspect of the liver appears grossly unchanged. There is a minimal amount intraparenchymal air along the tract from the biopsy adjacent to the ill-defined mixed attenuating lesion within the left lobe of the liver (image 19 and 20, series 3). Interval development of an adjacent area of hypo attenuation within the subcapsular aspect of the lateral segment of the left lobe of the liver (image 17, series 3 with associated adjacent subcapsular hypoattenuating fluid which demonstrates Hounsfield units suggestive of hemorrhage (image 18, series 3. No definitive intraparenchymal extension of suspected subcapsular hemorrhage though note is made of a small amount perisplenic fluid. Post cholecystectomy. Pancreas: Normal noncontrast appearance of the pancreas. Spleen: Note is made of a small amount of perisplenic fluid which demonstrates Hounsfield units of 22 (image 20, series 3) Adrenals/Urinary Tract: Mild atrophy of the bilateral kidneys. No renal stones. There is a minimal amount of likely age and body habitus related perinephric stranding. No urine obstruction Normal noncontrast appearance the bilateral adrenal glands. Normal appearance of the urinary bladder given underdistention. Stomach/Bowel: Moderate to large colonic stool burden without evidence of enteric obstruction. Scattered colonic diverticulosis without evidence of superimposed acute diverticulitis. Stable sequela of right hemicolectomy without evidence of enteric obstruction. No discrete areas of bowel wall thickening. Small hiatal hernia. No pneumoperitoneum, pneumatosis or portal venous gas. Vascular/Lymphatic: Extensive atherosclerotic plaque within normal caliber abdominal aorta. No bulky retroperitoneal, mesenteric, pelvic or inguinal lymphadenopathy on this noncontrast examination. Reproductive: Post hysterectomy. No discrete adnexal lesions. There is a  trace amount of fluid in the pelvic cul-de-sac. Other: Somewhat nodular foci of subcutaneous stranding within the abdominal pannus likely represents the location of subcutaneous medication administration. Mild diffuse body wall anasarca. Musculoskeletal: No definite acute or aggressive osseous abnormalities. Moderate multilevel lumbar spine DDD is suspected though incompletely evaluated. IMPRESSION: 1. Suspected development of intra parenchymal hematoma with associated subcapsular extension following ultrasound-guided liver lesion biopsy. No definite intra parenchymal extension however note is made of a small amount of perisplenic fluid. 2. Similar appearance of known infiltrative mass with the central aspects of the liver. 3. Stable sequela of right hemicolectomy without evidence of enteric obstruction. 4. Aortic Atherosclerosis (ICD10-I70.0). PLAN: Given above, patient subsequently underwent multiphase contrast-enhanced CT. Electronically Signed   By: Sandi Mariscal M.D.   On: 11/30/2020 09:11   US BIOPSY (LIVER)  Result Date: 11/29/2020 INDICATION: History of hepatocellular carcinoma as well as colon cancer, now with indeterminate liver lesions. Please perform ultrasound-guided liver lesion biopsy for tissue diagnostic purposes. EXAM: ULTRASOUND GUIDED LIVER LESION BIOPSY COMPARISON:  CT abdomen pelvis-10/26/2020; abdominal MRI-09/21/2020 MEDICATIONS: None ANESTHESIA/SEDATION: Fentanyl 50 mcg IV; Versed 1 mg IV Total Moderate Sedation time:  15 Minutes. The patient's level of consciousness and vital signs were monitored continuously by radiology nursing throughout the procedure under my direct supervision. COMPLICATIONS: None immediate. PROCEDURE: Informed written consent was obtained from the patient after a discussion of the risks, benefits and alternatives to treatment. The patient understands and consents the procedure. A timeout was performed prior to the initiation of the procedure. Ultrasound scanning was  performed of the right upper abdominal quadrant demonstrates an approximately 1.5 x 1.4 cm hypoechoic lesion within the left lobe of the liver correlating with the hypervascular lesion seen on preceding abdominal CT image 28, series 3. The procedure was planned. The midline of the upper abdomen was prepped and draped in the usual sterile fashion. The overlying soft tissues were anesthetized with 1% lidocaine with epinephrine. A 17 gauge, 6.8 cm co-axial needle was advanced into a peripheral aspect of the lesion. This was followed by 4 core biopsies with an 18 gauge core device under direct ultrasound guidance. The coaxial needle tract was embolized with a small amount of Gel-Foam slurry and superficial hemostasis was obtained with manual compression. Post procedural scanning was negative for definitive area of hemorrhage or additional complication. A dressing was placed. The patient tolerated the procedure well without immediate post procedural complication. IMPRESSION: Technically successful ultrasound guided core needle biopsy of indeterminate lesion within the left lobe of the liver. Electronically Signed   By: Sandi Mariscal M.D.   On: 11/29/2020 14:26   CT Angio Abd/Pel w/ and/or w/o  Result Date: 11/30/2020 CLINICAL DATA:  Concern for intraparenchymal hemorrhage with subcapsular extension preceding noncontrast CT following ultrasound-guided liver lesion biopsy. EXAM: CTA ABDOMEN AND PELVIS WITHOUT AND WITH CONTRAST TECHNIQUE: Multidetector CT imaging of the abdomen and pelvis was performed using the standard protocol during bolus administration of intravenous contrast. Multiplanar reconstructed images and MIPs were obtained and reviewed to evaluate the vascular anatomy. CONTRAST:  173mL OMNIPAQUE IOHEXOL 350 MG/ML SOLN COMPARISON:  Noncontrast CT scan of the abdomen and pelvis-earlier same day; CT abdomen pelvis-10/25/2020; 03/29/2019; chest CT-06/23/2017 FINDINGS: VASCULAR Aorta: Moderate to large amount of  eccentric calcified atherosclerotic plaque throughout the normal caliber abdominal aorta. Calcified atherosclerotic plaque at the origin of the takeoff of the accessory left renal artery results in approximately 50% luminal narrowing (image 63, series 5). No evidence of abdominal aortic dissection or periaortic stranding. Celiac: Redemonstrated separate origins of the common hepatic and splenic arteries in lieu of a conventional celiac trunk. There is a minimal amount of eccentric noncalcified atherosclerotic plaque involving the origin of the takeoff of the common hepatic artery, not resulting in hemodynamically significant stenosis. The splenic artery is widely patent without hemodynamically significant stenosis. SMA: There is a minimal amount of eccentric calcified atherosclerotic plaque involving the origin and main trunk of the SMA, not resulting in hemodynamically significant stenosis. Conventional branching pattern. The distal tributaries the SMA appear widely patent without discrete lumen filling defect. Renals: Note is made of 3 separate left-sided renal arteries there is a moderate amount of eccentric predominantly calcified atherosclerotic plaque involving the origin of the dominant left renal artery not definitely resulting in hemodynamically significant stenosis. The solitary right renal artery is widely patent without hemodynamically significant narrowing. No vessel irregularity to suggest FMD. IMA: Disease at its origin though remains patent. 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 The bilateral common iliac arteries are disease though without a definitive hemodynamically significant stenosis. The bilateral internal iliac arteries are disease though patent and of normal caliber. Bilateral external iliac arteries are mildly disease though patent and of caliber. Proximal Outflow: There is a minimal  amount of calcified atherosclerotic plaque involving the bilateral common femoral arteries, not resulting in hemodynamically significant stenosis. The imaged portions of the bilateral deep and superficial femoral arteries are of caliber and widely patent without hemodynamically significant narrowing. Veins:  The IVC appears widely patent as does the portal venous system. Review of the MIP images confirms the above findings. _________________________________________________________ NON-VASCULAR Lower chest: Limited visualization of lower thorax demonstrates minimal dependent subpleural ground-glass atelectasis. The approximately 0.7 cm subpleural nodule within the right middle lobe (image 22, series 9), is unchanged since at least the 06/2017 examination and thus a benign etiology. No pleural effusion or pneumothorax. Normal heart size. No pericardial effusion. Hepatobiliary: Normal hepatic contour. Redemonstrated mixed attenuating mass within the central aspect of the liver measuring approximately 3.9 x 3.3 cm (image 19, series 6). Redemonstrated approximately 1.3 x 1.0 cm hypoattenuating lesion within the left lobe of the liver with adjacent Gel-Foam embolization material (representative images 18 and 19, series 6). Interval development of an adjacent intraparenchymal hematomas with medial component measuring approximately 2.2 x 1.3 cm (image 22, series 6) and dominant component within the more lateral aspect of the lateral segment of left lobe of the liver measuring approximately 2.8 x 2.1 cm (image 17, series 6) and appears to extend to the liver capsule (image 19, series 6) with associated adjacent subcapsular hematoma. This finding is associated with minimal pooling of contrast within the central aspect of the hematoma (image 17, series 6). No definitive intra part parenchymal extension however note is made of a small amount of perisplenic fluid. Pancreas: Normal appearance of the pancreas. Spleen: Note is made  of a small amount of perisplenic fluid. Adrenals/Urinary Tract: There is symmetric enhancement of the bilateral kidneys. No evidence of nephrolithiasis on this postcontrast examination. No discrete renal lesions. There is a minimal amount of likely age and body habitus related bilateral perinephric stranding. No urine obstruction. Normal appearance of the bilateral adrenal glands. The urinary bladder is underdistended. Stomach/Bowel: Colonic diverticulosis without evidence of superimposed acute diverticulitis. Moderate colonic stool burden. Sequela right hemicolectomy without evidence of enteric obstruction. Small hiatal hernia. No pneumoperitoneum, pneumatosis or portal venous gas. Lymphatic: No bulky retroperitoneal, mesenteric, pelvic or inguinal lymphadenopathy. Reproductive: Post hysterectomy. No discrete adnexal lesions. Trace amount of fluid within the pelvic cul-de-sac. Other: Nodular thickening involving the subcutaneous tissues of the undersurface of the pannus, likely at the location of subcutaneous medication administration. Minimal amount of subcutaneous edema about midline of the low back. Musculoskeletal: No acute or aggressive osseous abnormalities. Stigmata of dish within the thoracic spine. Posteriorly directed disc osteophyte complex at T12-L1. IMPRESSION: Vascular Impression: 1. Post ultrasound-guided left sided liver lesion biopsy complicated by development of intraparenchymal hematomas. There is pooling of contrast within the dominant more laterally located hematoma with apparent extension of the hematoma to the liver capsule edge with associated adjacent subcapsular hemorrhage. 2. Small amount of perisplenic fluid. Otherwise, no definitive intraparenchymal extension. 3. Large amount of atherosclerotic plaque including a suspected hemodynamically significant stenosis within the infrarenal abdominal aorta. Aortic Atherosclerosis (ICD10-I70.0). 4. 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 5. Non conventional arterial anatomy including separate origins of the common hepatic and splenic arteries in lieu of a conventional celiac trunk as detailed above. Nonvascular Impression: 1. Image grossly unchanged appearance of known dominant infiltrative mass within the central aspect of the liver, compatible with biopsy-proven hepatocellular carcinoma. 2. Sequela of right hemicolectomy without evidence of enteric obstruction. 3. Colonic diverticulosis without evidence of superimposed acute diverticulitis. 4. Small hiatal hernia. PLAN: Given above findings the decision was made to admit the patient for observation as she fortunately remained hemodynamically stable with subjective improvement in her transiently  worsening abdominal pain. We will monitor the patient's H and H and keep the patient NPO in case arteriogram and embolization is ultimately required. Electronically Signed   By: Sandi Mariscal M.D.   On: 11/30/2020 09:47    Labs:  CBC: Recent Labs    11/21/20 1249 11/29/20 1129 11/30/20 0106 11/30/20 0924  WBC 9.4 9.2 10.1 10.5  HGB 13.6 13.5 10.2* 10.3*  HCT 43.2 42.0 31.2* 32.0*  PLT 189 200 170 172    COAGS: Recent Labs    11/29/20 1129  INR 1.0    BMP: Recent Labs    12/26/19 1008 04/02/20 0909 09/19/20 1032 10/23/20 1004 11/21/20 1249  NA 139 139  --  139 140  K 3.8 4.8  --  4.3 5.0  CL 102 104  --  105 104  CO2 29 26  --  25 29  GLUCOSE 113* 198*  --  224* 92  BUN 23 20 18 19 19   CALCIUM 9.0 9.5  --  9.4 9.3  CREATININE 1.00 1.14*  --  1.04* 1.12*  GFRNONAA 55* 47*  --  56* 51*  GFRAA >60 54*  --   --   --     LIVER FUNCTION TESTS: Recent Labs    12/26/19 1008 04/02/20 0909 10/23/20 1004 11/21/20 1249  BILITOT 0.4 0.6 0.9 0.7  AST 21 29 26 25   ALT 28 30 25 23   ALKPHOS 46 46 41 41  PROT 6.8 6.8 6.5 7.1  ALBUMIN 3.6 3.8 3.6 3.6    Assessment and Plan: Liver lesions s/p liver  lesion biopsy complicated by development of intraparenchymal hematomas Patient assessed at bedside this AM.  Reports improvement in her abdominal pain.  Remains tender and sore.  Has not been OOB yet-- was on bed rest overnight.  Hgb 13.5  10.2 after procedure.  Repeated value 10.3 this AM. Vital signs stable.  BP 105/77, HR 82 Tolerating clears.  Will advance diet to Regular.  Monitor progress today.  Daughter at bedside aware and agreeable.   Electronically Signed: Docia Barrier, PA 11/30/2020, 12:23 PM   I spent a total of 25 Minutes at the the patient's bedside AND on the patient's hospital floor or unit, greater than 50% of which was counseling/coordinating care for liver lesion.

## 2020-12-01 ENCOUNTER — Observation Stay (HOSPITAL_BASED_OUTPATIENT_CLINIC_OR_DEPARTMENT_OTHER): Payer: Medicare Other

## 2020-12-01 DIAGNOSIS — C189 Malignant neoplasm of colon, unspecified: Secondary | ICD-10-CM

## 2020-12-01 DIAGNOSIS — K529 Noninfective gastroenteritis and colitis, unspecified: Secondary | ICD-10-CM

## 2020-12-01 DIAGNOSIS — K769 Liver disease, unspecified: Secondary | ICD-10-CM | POA: Diagnosis not present

## 2020-12-01 DIAGNOSIS — N183 Chronic kidney disease, stage 3 unspecified: Secondary | ICD-10-CM | POA: Diagnosis not present

## 2020-12-01 DIAGNOSIS — E1169 Type 2 diabetes mellitus with other specified complication: Secondary | ICD-10-CM

## 2020-12-01 DIAGNOSIS — I2699 Other pulmonary embolism without acute cor pulmonale: Secondary | ICD-10-CM

## 2020-12-01 DIAGNOSIS — I82411 Acute embolism and thrombosis of right femoral vein: Secondary | ICD-10-CM

## 2020-12-01 DIAGNOSIS — I82401 Acute embolism and thrombosis of unspecified deep veins of right lower extremity: Secondary | ICD-10-CM

## 2020-12-01 DIAGNOSIS — C220 Liver cell carcinoma: Secondary | ICD-10-CM

## 2020-12-01 DIAGNOSIS — Z86711 Personal history of pulmonary embolism: Secondary | ICD-10-CM

## 2020-12-01 DIAGNOSIS — Z794 Long term (current) use of insulin: Secondary | ICD-10-CM

## 2020-12-01 DIAGNOSIS — I10 Essential (primary) hypertension: Secondary | ICD-10-CM

## 2020-12-01 DIAGNOSIS — D62 Acute posthemorrhagic anemia: Secondary | ICD-10-CM

## 2020-12-01 DIAGNOSIS — R58 Hemorrhage, not elsewhere classified: Secondary | ICD-10-CM

## 2020-12-01 DIAGNOSIS — M069 Rheumatoid arthritis, unspecified: Secondary | ICD-10-CM

## 2020-12-01 HISTORY — DX: Acute embolism and thrombosis of unspecified deep veins of right lower extremity: I82.401

## 2020-12-01 LAB — CBC WITH DIFFERENTIAL/PLATELET
Abs Immature Granulocytes: 0.02 10*3/uL (ref 0.00–0.07)
Abs Immature Granulocytes: 0.04 10*3/uL (ref 0.00–0.07)
Abs Immature Granulocytes: 0.03 10*3/uL (ref 0.00–0.07)
Basophils Absolute: 0 10*3/uL (ref 0.0–0.1)
Basophils Absolute: 0 10*3/uL (ref 0.0–0.1)
Basophils Absolute: 0 10*3/uL (ref 0.0–0.1)
Basophils Relative: 0 %
Basophils Relative: 0 %
Basophils Relative: 0 %
Eosinophils Absolute: 0.1 10*3/uL (ref 0.0–0.5)
Eosinophils Absolute: 0.1 10*3/uL (ref 0.0–0.5)
Eosinophils Absolute: 0.1 10*3/uL (ref 0.0–0.5)
Eosinophils Relative: 1 %
Eosinophils Relative: 1 %
Eosinophils Relative: 2 %
HCT: 26.4 % — ABNORMAL LOW (ref 36.0–46.0)
HCT: 27.9 % — ABNORMAL LOW (ref 36.0–46.0)
HCT: 27.4 % — ABNORMAL LOW (ref 36.0–46.0)
Hemoglobin: 8.7 g/dL — ABNORMAL LOW (ref 12.0–15.0)
Hemoglobin: 9.1 g/dL — ABNORMAL LOW (ref 12.0–15.0)
Hemoglobin: 8.7 g/dL — ABNORMAL LOW (ref 12.0–15.0)
Immature Granulocytes: 0 %
Immature Granulocytes: 1 %
Immature Granulocytes: 0 %
Lymphocytes Relative: 24 %
Lymphocytes Relative: 24 %
Lymphocytes Relative: 25 %
Lymphs Abs: 1.9 10*3/uL (ref 0.7–4.0)
Lymphs Abs: 1.9 10*3/uL (ref 0.7–4.0)
Lymphs Abs: 1.7 10*3/uL (ref 0.7–4.0)
MCH: 32 pg (ref 26.0–34.0)
MCH: 31.7 pg (ref 26.0–34.0)
MCH: 31.2 pg (ref 26.0–34.0)
MCHC: 33 g/dL (ref 30.0–36.0)
MCHC: 32.6 g/dL (ref 30.0–36.0)
MCHC: 31.8 g/dL (ref 30.0–36.0)
MCV: 97.1 fL (ref 80.0–100.0)
MCV: 97.2 fL (ref 80.0–100.0)
MCV: 98.2 fL (ref 80.0–100.0)
Monocytes Absolute: 0.9 10*3/uL (ref 0.1–1.0)
Monocytes Absolute: 1 10*3/uL (ref 0.1–1.0)
Monocytes Absolute: 1 10*3/uL (ref 0.1–1.0)
Monocytes Relative: 11 %
Monocytes Relative: 13 %
Monocytes Relative: 15 %
Neutro Abs: 5.1 10*3/uL (ref 1.7–7.7)
Neutro Abs: 5 10*3/uL (ref 1.7–7.7)
Neutro Abs: 4 10*3/uL (ref 1.7–7.7)
Neutrophils Relative %: 64 %
Neutrophils Relative %: 61 %
Neutrophils Relative %: 58 %
Platelets: 137 10*3/uL — ABNORMAL LOW (ref 150–400)
Platelets: 138 10*3/uL — ABNORMAL LOW (ref 150–400)
Platelets: 125 10*3/uL — ABNORMAL LOW (ref 150–400)
RBC: 2.72 MIL/uL — ABNORMAL LOW (ref 3.87–5.11)
RBC: 2.87 MIL/uL — ABNORMAL LOW (ref 3.87–5.11)
RBC: 2.79 MIL/uL — ABNORMAL LOW (ref 3.87–5.11)
RDW: 13.4 % (ref 11.5–15.5)
RDW: 13.6 % (ref 11.5–15.5)
RDW: 13.5 % (ref 11.5–15.5)
WBC: 8 10*3/uL (ref 4.0–10.5)
WBC: 8.1 10*3/uL (ref 4.0–10.5)
WBC: 6.8 10*3/uL (ref 4.0–10.5)
nRBC: 0 % (ref 0.0–0.2)
nRBC: 0 % (ref 0.0–0.2)
nRBC: 0 % (ref 0.0–0.2)

## 2020-12-01 LAB — COMPREHENSIVE METABOLIC PANEL
ALT: 66 U/L — ABNORMAL HIGH (ref 0–44)
AST: 62 U/L — ABNORMAL HIGH (ref 15–41)
Albumin: 2.5 g/dL — ABNORMAL LOW (ref 3.5–5.0)
Alkaline Phosphatase: 31 U/L — ABNORMAL LOW (ref 38–126)
Anion gap: 4 — ABNORMAL LOW (ref 5–15)
BUN: 22 mg/dL (ref 8–23)
CO2: 26 mmol/L (ref 22–32)
Calcium: 8.2 mg/dL — ABNORMAL LOW (ref 8.9–10.3)
Chloride: 108 mmol/L (ref 98–111)
Creatinine, Ser: 1.17 mg/dL — ABNORMAL HIGH (ref 0.44–1.00)
GFR, Estimated: 48 mL/min — ABNORMAL LOW (ref >60)
Glucose, Bld: 143 mg/dL — ABNORMAL HIGH (ref 70–99)
Potassium: 4.5 mmol/L (ref 3.5–5.1)
Sodium: 138 mmol/L (ref 135–145)
Total Bilirubin: 0.8 mg/dL (ref 0.3–1.2)
Total Protein: 4.8 g/dL — ABNORMAL LOW (ref 6.5–8.1)

## 2020-12-01 LAB — SARS CORONAVIRUS 2 (TAT 6-24 HRS): SARS Coronavirus 2: NEGATIVE

## 2020-12-01 LAB — PROTIME-INR
INR: 1.1 (ref 0.8–1.2)
Prothrombin Time: 14.6 seconds (ref 11.4–15.2)

## 2020-12-01 LAB — TYPE AND SCREEN
ABO/RH(D): O POS
Antibody Screen: NEGATIVE

## 2020-12-01 MED ORDER — LOPERAMIDE HCL 2 MG PO CAPS
2.0000 mg | ORAL_CAPSULE | Freq: Every day | ORAL | Status: DC | PRN
  Administered 2020-12-02 (×2): 2 mg via ORAL
  Filled 2020-12-01 (×2): qty 1

## 2020-12-01 MED ORDER — CARVEDILOL 6.25 MG PO TABS
6.2500 mg | ORAL_TABLET | Freq: Two times a day (BID) | ORAL | Status: DC
  Administered 2020-12-01 – 2020-12-02 (×2): 6.25 mg via ORAL
  Filled 2020-12-01 (×4): qty 1

## 2020-12-01 NOTE — Consult Note (Signed)
Consult note   Wanda Curry JME:[id number] DOB: Oct 03, 1945 DOA: 11/29/2020  PCP: Earney Mallet, MD Patient coming from: home  Chief Complaint: abdominal aching  HPI: Wanda Curry is a 75 y.o. female with medical history significant of PE on Xarelto, Hepatocellular carcinoma s/p bland hepatic embolization 07/24/20 in IR , Stage IIa (CT3CN0) colon cancer s/p Right hemicolectomy on 10/10/2016, T2DM, HLD, chronic diarrhea, RA on MTX who presented on 11/29/20 for liver lesion biopsy.   She was admitted for Liver lesions s/p liver lesion biopsy complicated by development of intraparenchymal hematomas. She was reported to have one episode near syncope and transient hypotension on 4/14. She reports feeling ok for last couple of days. Her VS showed P 80's and BP 110-120's/46-55's.  Her HGB was trended 13.5--> 10.2--> 10.3-->8.7--> 9.1 thus far. [facility] is consulted for medical management.    Review of Systems: As per HPI otherwise 10 point review of systems negative.  Review of Systems Otherwise negative except as per HPI, including: General: Denies fever, chills, night sweats or unintended weight loss. Resp: Denies cough, wheezing, shortness of breath. Cardiac: Denies chest pain, palpitations, orthopnea, paroxysmal nocturnal dyspnea.. positive for one episode near syncope and transient hypotension on 4/14.   GI positive for mild abdomen epigastric area pain: Denies nausea, vomiting, diarrhea or constipation GU: Denies dysuria, frequency, hesitancy or incontinence MS: Denies muscle aches, joint pain or swelling Neuro: Denies headache, neurologic deficits (focal weakness, numbness, tingling), abnormal gait Psych: Denies anxiety, depression, SI/HI/AVH Skin: Denies new rashes or lesions ID: Denies sick contacts, exotic exposures, travel  Past Medical History:  Diagnosis Date  . Anemia   . Arthritis    RA  . Cancer ([place])    skin cancer  . Depression   . Diabetes mellitus  without complication ([place])   . Fibromyalgia   . GERD (gastroesophageal reflux disease)   . Heart murmur    ECHO scheduled 05-25-2015  . Hyperlipidemia   . Neuropathy   . PONV (postoperative nausea and vomiting)     Past Surgical History:  Procedure Laterality Date  . ABDOMINAL HYSTERECTOMY    . BIOPSY  09/23/2016   Procedure: BIOPSY;  Surgeon: Danie Binder, MD;  [facility];  Service: Endoscopy;;  hepatic flexure mass  . CHOLECYSTECTOMY    . COLONOSCOPY N/A 08/13/2017   Procedure: COLONOSCOPY;  Surgeon: Daneil Dolin, MD;  [facility];  Service: Endoscopy;  Laterality: N/A;  . COLONOSCOPY WITH PROPOFOL N/A 09/23/2016   Procedure: COLONOSCOPY WITH PROPOFOL;  Surgeon: Danie Binder, MD;  [facility];  Service: Endoscopy;  Laterality: N/A;  7:30 am  . HARDWARE REMOVAL Right 05/30/2015   Procedure: HARDWARE REMOVAL;  Surgeon: Ninetta Lights, MD;  Location: [place];  Service: Orthopedics;  Laterality: Right;  . IR ANGIOGRAM SELECTIVE EACH ADDITIONAL VESSEL  07/25/2019  . IR ANGIOGRAM SELECTIVE EACH ADDITIONAL VESSEL  07/25/2019  . IR ANGIOGRAM VISCERAL SELECTIVE  07/25/2019  . IR ANGIOGRAM VISCERAL SELECTIVE  07/25/2019  . IR EMBO TUMOR ORGAN ISCHEMIA INFARCT INC GUIDE ROADMAPPING  07/25/2019  . IR RADIOLOGIST EVAL & MGMT  06/09/2019  . IR RADIOLOGIST EVAL & MGMT  07/07/2019  . IR RADIOLOGIST EVAL & MGMT  01/05/2020  . IR RADIOLOGIST EVAL & MGMT  04/12/2020  . IR RADIOLOGIST EVAL & MGMT  09/27/2020  . IR RADIOLOGIST EVAL & MGMT  10/31/2020  . IR US GUIDE VASC ACCESS RIGHT  07/25/2019  . PARTIAL COLECTOMY N/A 10/10/2016  Procedure: PARTIAL COLECTOMY;  Surgeon: Aviva Signs, MD;  Location: [facility];  Service: General;  Laterality: N/A;  . planter warts Bilateral    both feet  . POLYPECTOMY  09/23/2016   Procedure: POLYPECTOMY;  Surgeon: Danie Binder, MD;  [facility];  Service: Endoscopy;;  transverse colon polyps times 2, rectal polyp  . skin  grafts     due to planter warts  . TOTAL KNEE ARTHROPLASTY Right 05/30/2015   Procedure: RIGHT TOTAL KNEE ARTHROPLASTY;  Surgeon: Ninetta Lights, MD;  Location: [place];  Service: Orthopedics;  Laterality: Right;    SOCIAL HISTORY:  reports that she quit smoking about 16 years ago. Her smoking use included cigarettes. She has a 40.00 pack-year smoking history. She has never used smokeless tobacco. She reports that she does not drink alcohol and does not use drugs.  Allergies  Allergen Reactions  . Adhesive [Tape]     Adhesive tape and band-aids cause skin irritation  . Neosporin Original [Bacitracin-Neomycin-Polymyxin] Other (See Comments)    blister  . Sulfa Antibiotics Nausea Only and Other (See Comments)    Joint paint  . Erythromycin Itching and Rash    burning    FAMILY HISTORY: Family History  Problem Relation Age of Onset  . Colon cancer Neg Hx      Prior to Admission medications   Medication Sig Start Date End Date Taking? Authorizing Provider  acetaminophen (TYLENOL) 500 MG tablet Take 500 mg by mouth every 6 (six) hours as needed (for pain.).   Yes [provider]  APPLE CIDER VINEGAR PO Take 450 mg by mouth daily.   Yes [provider]  Calcium Carbonate-Vitamin D3 600-400 MG-UNIT TABS Take 1 tablet by mouth daily.   Yes [provider]  carvedilol (COREG) 6.25 MG tablet Take 6.25 mg by mouth 2 (two) times daily with a meal.   Yes [provider]  folic acid (FOLVITE) 1 MG tablet Take 1 mg by mouth daily.   Yes [provider]  furosemide (LASIX) 40 MG tablet Take 20 mg by mouth daily as needed for edema. 07/24/19  Yes [provider]  loperamide (IMODIUM) 2 MG capsule Take 2 mg by mouth daily.   Yes [provider]  magnesium gluconate (MAGONATE) 500 MG tablet Take 500 mg by mouth daily as needed (cramps).   Yes [provider]  methotrexate (RHEUMATREX) 2.5 MG tablet Take 20 mg by mouth every  Sunday. Caution:Chemotherapy. Protect from light.   Yes [provider]  Multiple Vitamins-Minerals (CENTRUM SILVER PO) Take 1 tablet by mouth daily.   Yes [provider]  NOVOLOG MIX 70/30 FLEXPEN (70-30) 100 UNIT/ML FlexPen Inject 20-30 Units into the skin See admin instructions. Take 30 units in the am and Take 20 units in the evening 09/14/16  Yes [provider]  omeprazole (PRILOSEC) 40 MG capsule Take 40 mg by mouth daily.  11/17/16  Yes [provider]  rivaroxaban (XARELTO) 20 MG TABS tablet Take 1 tablet (20 mg total) by mouth daily with supper. 11/21/20  Yes Derek Jack, MD  rOPINIRole (REQUIP) 1 MG tablet Take 1 mg by mouth every morning. MAY TAKE 2 ADDITIONAL DOSES  AS NEEDED   Yes [provider]  Saline (AYR SALINE NASAL DROPS) 0.65 % SOLN Place 1 spray into the nose daily as needed (congestion).   Yes [provider]  vitamin E 180 MG (400 UNITS) capsule Take 400 Units by mouth daily.  Yes [provider]    Physical Exam: Vitals:   11/30/20 2009 12/01/20 0417 12/01/20 0452 12/01/20 0800  BP: (!) 127/55  (!) 117/46 (!) 109/46  Pulse: 90  79 81  Resp: 16  14 18   Temp: 99.1 F (37.3 C)  98.2 F (36.8 C)   TempSrc: Oral  Axillary   SpO2: 94%  93% 95%  Weight:  108.4 kg    Height:          Constitutional: NAD, calm, comfortable Eyes: PERRL, lids and conjunctivae normal ENMT: Mucous membranes are moist. Posterior pharynx clear of any exudate or lesions.Normal dentition.  Neck: normal, supple, no masses, no thyromegaly Respiratory: clear to auscultation bilaterally, no wheezing, no crackles. Normal respiratory effort. No accessory muscle use.  Cardiovascular: Regular rate and rhythm, no murmurs / rubs / gallops. No extremity edema. 2+ pedal pulses. No carotid bruits.  Abdomen: no tenderness, no masses palpated. No hepatosplenomegaly. Bowel sounds positive.  Musculoskeletal: no clubbing / cyanosis. No  joint deformity upper and lower extremities. Good ROM, no contractures. Normal muscle tone.  Skin: no rashes, lesions, ulcers. No induration Neurologic: CN 2-12 grossly intact. Sensation intact, DTR normal. Strength 5/5 in all 4.  Psychiatric: Normal judgment and insight. Alert and oriented x 3. Normal mood.     Labs on Admission: I have personally reviewed following labs and imaging studies  CBC: Recent Labs  Lab 11/29/20 1129 11/30/20 0106 11/30/20 0924 12/01/20 0050 12/01/20 0929  WBC 9.2 10.1 10.5 8.0 8.1  NEUTROABS  --   --  7.7 5.1 5.0  HGB 13.5 10.2* 10.3* 8.7* 9.1*  HCT 42.0 31.2* 32.0* 26.4* 27.9*  MCV 97.0 95.4 95.8 97.1 97.2  PLT 200 170 172 137* 329*   Basic Metabolic Panel: Recent Labs  Lab 12/01/20 0050  Alondra Vandeven 138  K 4.5  CL 108  CO2 26  GLUCOSE 143*  BUN 22  CREATININE 1.17*  CALCIUM 8.2*   GFR: Estimated Creatinine Clearance: 48.3 mL/min (A) (by C-G formula based on SCr of 1.17 mg/dL (H)). Liver Function Tests: Recent Labs  Lab 12/01/20 0050  AST 62*  ALT 66*  ALKPHOS 31*  BILITOT 0.8  PROT 4.8*  ALBUMIN 2.5*   No results for input(s): LIPASE, AMYLASE in the last 168 hours. No results for input(s): AMMONIA in the last 168 hours. Coagulation Profile: Recent Labs  Lab 11/29/20 1129 12/01/20 0929  INR 1.0 1.1   Cardiac Enzymes: No results for input(s): CKTOTAL, CKMB, CKMBINDEX, TROPONINI in the last 168 hours. BNP (last 3 results) No results for input(s): PROBNP in the last 8760 hours. HbA1C: Recent Labs    11/29/20 2118  HGBA1C 6.5*   CBG: Recent Labs  Lab 11/29/20 1432 11/29/20 1655 11/29/20 2212 11/30/20 0816 11/30/20 1211  GLUCAP 82 185* 199* 151* 168*   Lipid Profile: No results for input(s): CHOL, HDL, LDLCALC, TRIG, CHOLHDL, LDLDIRECT in the last 72 hours. Thyroid Function Tests: No results for input(s): TSH, T4TOTAL, FREET4, T3FREE, THYROIDAB in the last 72 hours. Anemia Panel: No results for input(s): VITAMINB12,  FOLATE, FERRITIN, TIBC, IRON, RETICCTPCT in the last 72 hours. Urine analysis:    Component Value Date/Time   COLORURINE STRAW (A) 08/12/2017 1323   APPEARANCEUR CLEAR 08/12/2017 1323   LABSPEC 1.006 08/12/2017 1323   PHURINE 5.0 08/12/2017 1323   GLUCOSEU >=500 (A) 08/12/2017 1323   HGBUR MODERATE (A) 08/12/2017 1323   BILIRUBINUR NEGATIVE 08/12/2017 1323   KETONESUR NEGATIVE 08/12/2017 1323   PROTEINUR NEGATIVE 08/12/2017  [place] 08/12/2017 1323   LEUKOCYTESUR LARGE (A) 08/12/2017 1323   Sepsis Labs: !!!!!!!!!!!!!!!!!!!!!!!!!!!!!!!!!!!!!!!!!!!! @LABRCNTIP (procalcitonin:4,lacticidven:4) )No results found for this or any previous visit (from the past 240 hour(s)).   Radiological Exams on Admission: CT ABDOMEN PELVIS WO CONTRAST  Result Date: 11/30/2020 CLINICAL DATA:  Known history of colorectal cancer and hepatocellular carcinoma, now with acute onset of abdominal pain following liver lesion biopsy. EXAM: CT ABDOMEN AND PELVIS WITHOUT CONTRAST TECHNIQUE: Multidetector CT imaging of the abdomen and pelvis was performed following the standard protocol without IV contrast. COMPARISON:  CT abdomen and pelvis-10/25/2020; 09/16/2018 FINDINGS: The lack of intravenous contrast limits the ability to evaluate solid abdominal organs. Lower chest: Limited visualization of the lower thorax demonstrates minimal dependent subpleural ground-glass atelectasis. Unchanged punctate (0.6 cm) sub pleural nodule within the right middle lobe is unchanged since at least the 08/2018 examination and thus of benign etiology. No discrete focal airspace opacities. No pleural effusion. Normal heart size. No pericardial effusion. Calcifications of the mitral valve annulus. Hepatobiliary: Normal hepatic contour. Known infiltrative mass within the central aspect of the liver appears grossly unchanged. There is a minimal amount intraparenchymal air along the tract from the biopsy adjacent to the ill-defined  mixed attenuating lesion within the left lobe of the liver (image 19 and 20, series 3). Interval development of an adjacent area of hypo attenuation within the subcapsular aspect of the lateral segment of the left lobe of the liver (image 17, series 3 with associated adjacent subcapsular hypoattenuating fluid which demonstrates Hounsfield units suggestive of hemorrhage (image 18, series 3. No definitive intraparenchymal extension of suspected subcapsular hemorrhage though note is made of a small amount perisplenic fluid. Post cholecystectomy. Pancreas: Normal noncontrast appearance of the pancreas. Spleen: Note is made of a small amount of perisplenic fluid which demonstrates Hounsfield units of 22 (image 20, series 3) Adrenals/Urinary Tract: Mild atrophy of the bilateral kidneys. No renal stones. There is a minimal amount of likely age and body habitus related perinephric stranding. No urine obstruction Normal noncontrast appearance the bilateral adrenal glands. Normal appearance of the urinary bladder given underdistention. Stomach/Bowel: Moderate to large colonic stool burden without evidence of enteric obstruction. Scattered colonic diverticulosis without evidence of superimposed acute diverticulitis. Stable sequela of right hemicolectomy without evidence of enteric obstruction. No discrete areas of bowel wall thickening. Small hiatal hernia. No pneumoperitoneum, pneumatosis or portal venous gas. Vascular/Lymphatic: Extensive atherosclerotic plaque within normal caliber abdominal aorta. No bulky retroperitoneal, mesenteric, pelvic or inguinal lymphadenopathy on this noncontrast examination. Reproductive: Post hysterectomy. No discrete adnexal lesions. There is a trace amount of fluid in the pelvic cul-de-sac. Other: Somewhat nodular foci of subcutaneous stranding within the abdominal pannus likely represents the location of subcutaneous medication administration. Mild diffuse body wall anasarca. Musculoskeletal:  No definite acute or aggressive osseous abnormalities. Moderate multilevel lumbar spine DDD is suspected though incompletely evaluated. IMPRESSION: 1. Suspected development of intra parenchymal hematoma with associated subcapsular extension following ultrasound-guided liver lesion biopsy. No definite intra parenchymal extension however note is made of a small amount of perisplenic fluid. 2. Similar appearance of known infiltrative mass with the central aspects of the liver. 3. Stable sequela of right hemicolectomy without evidence of enteric obstruction. 4. Aortic Atherosclerosis (ICD10-I70.0). PLAN: Given above, patient subsequently underwent multiphase contrast-enhanced CT. Electronically Signed   By: Sandi Mariscal M.D.   On: 11/30/2020 09:11   US BIOPSY (LIVER)  Result Date: 11/29/2020 INDICATION: History of hepatocellular carcinoma as well as colon cancer, now with indeterminate  liver lesions. Please perform ultrasound-guided liver lesion biopsy for tissue diagnostic purposes. EXAM: ULTRASOUND GUIDED LIVER LESION BIOPSY COMPARISON:  CT abdomen pelvis-10/26/2020; abdominal MRI-09/21/2020 MEDICATIONS: None ANESTHESIA/SEDATION: Fentanyl 50 mcg IV; Versed 1 mg IV Total Moderate Sedation time:  15 Minutes. The patient's level of consciousness and vital signs were monitored continuously by radiology nursing throughout the procedure under my direct supervision. COMPLICATIONS: None immediate. PROCEDURE: Informed written consent was obtained from the patient after a discussion of the risks, benefits and alternatives to treatment. The patient understands and consents the procedure. A timeout was performed prior to the initiation of the procedure. Ultrasound scanning was performed of the right upper abdominal quadrant demonstrates an approximately 1.5 x 1.4 cm hypoechoic lesion within the left lobe of the liver correlating with the hypervascular lesion seen on preceding abdominal CT image 28, series 3. The procedure was  planned. The midline of the upper abdomen was prepped and draped in the usual sterile fashion. The overlying soft tissues were anesthetized with 1% lidocaine with epinephrine. A 17 gauge, 6.8 cm co-axial needle was advanced into a peripheral aspect of the lesion. This was followed by 4 core biopsies with an 18 gauge core device under direct ultrasound guidance. The coaxial needle tract was embolized with a small amount of Gel-Foam slurry and superficial hemostasis was obtained with manual compression. Post procedural scanning was negative for definitive area of hemorrhage or additional complication. A dressing was placed. The patient tolerated the procedure well without immediate post procedural complication. IMPRESSION: Technically successful ultrasound guided core needle biopsy of indeterminate lesion within the left lobe of the liver. Electronically Signed   By: Sandi Mariscal M.D.   On: 11/29/2020 14:26   VAS [place] LOWER EXTREMITY VENOUS (DVT)  Result Date: 12/01/2020  Lower Venous DVT Study Indications: Pulmonary embolism 10/25/20.  Risk Factors: Cancer newly diagnosed hepatocellular carcinoma 11/29/20. Limitations: Body habitus. Comparison Study: No prior study Performing Technologist: Sharion Dove RVS  Examination Guidelines: A complete evaluation includes B-mode imaging, spectral Doppler, color Doppler, and power Doppler as needed of all accessible portions of each vessel. Bilateral testing is considered an integral part of a complete examination. Limited examinations for reoccurring indications may be performed as noted. The reflux portion of the exam is performed with the patient in reverse Trendelenburg.  +---------+---------------+---------+-----------+----------+-------------------+ RIGHT    CompressibilityPhasicitySpontaneityPropertiesThrombus Aging      +---------+---------------+---------+-----------+----------+-------------------+ CFV      Partial        No       No                   Age  Indeterminate   +---------+---------------+---------+-----------+----------+-------------------+ SFJ      Partial                                      Age Indeterminate   +---------+---------------+---------+-----------+----------+-------------------+ FV Prox  Full                                                             +---------+---------------+---------+-----------+----------+-------------------+ FV Mid   Full                                                             +---------+---------------+---------+-----------+----------+-------------------+  FV DistalFull                                                             +---------+---------------+---------+-----------+----------+-------------------+ PFV      Full                                                             +---------+---------------+---------+-----------+----------+-------------------+ POP      Full           Yes      Yes                                      +---------+---------------+---------+-----------+----------+-------------------+ PTV      Full                                                             +---------+---------------+---------+-----------+----------+-------------------+ PERO                                                  Not well visualized +---------+---------------+---------+-----------+----------+-------------------+   +---------+---------------+---------+-----------+----------+-------------------+ LEFT     CompressibilityPhasicitySpontaneityPropertiesThrombus Aging      +---------+---------------+---------+-----------+----------+-------------------+ CFV      Full           Yes      Yes                                      +---------+---------------+---------+-----------+----------+-------------------+ SFJ      Full                                                              +---------+---------------+---------+-----------+----------+-------------------+ FV Prox  Full                                                             +---------+---------------+---------+-----------+----------+-------------------+ FV Mid   Full                                                             +---------+---------------+---------+-----------+----------+-------------------+ FV DistalFull                                                             +---------+---------------+---------+-----------+----------+-------------------+  PFV      Full                                                             +---------+---------------+---------+-----------+----------+-------------------+ POP      Full           Yes      Yes                                      +---------+---------------+---------+-----------+----------+-------------------+ PTV      Full                                                             +---------+---------------+---------+-----------+----------+-------------------+ PERO                                                  Not well visualized +---------+---------------+---------+-----------+----------+-------------------+    Summary: RIGHT: - Findings consistent with age indeterminate deep vein thrombosis involving the right common femoral vein, and SF junction.  LEFT: - There is no evidence of deep vein thrombosis in the lower extremity. However, portions of this examination were limited- see technologist comments above.  *See table(s) above for measurements and observations.    Preliminary    CT Angio Abd/Pel w/ and/or w/o  Result Date: 11/30/2020 CLINICAL DATA:  Concern for intraparenchymal hemorrhage with subcapsular extension preceding noncontrast CT following ultrasound-guided liver lesion biopsy. EXAM: CTA ABDOMEN AND PELVIS WITHOUT AND WITH CONTRAST TECHNIQUE: Multidetector CT imaging of the abdomen and pelvis was performed using  the standard protocol during bolus administration of intravenous contrast. Multiplanar reconstructed images and MIPs were obtained and reviewed to evaluate the vascular anatomy. CONTRAST:  161mL OMNIPAQUE IOHEXOL 350 MG/ML SOLN COMPARISON:  Noncontrast CT scan of the abdomen and pelvis-earlier same day; CT abdomen pelvis-10/25/2020; 03/29/2019; chest CT-06/23/2017 FINDINGS: VASCULAR Aorta: Moderate to large amount of eccentric calcified atherosclerotic plaque throughout the normal caliber abdominal aorta. Calcified atherosclerotic plaque at the origin of the takeoff of the accessory left renal artery results in approximately 50% luminal narrowing (image 63, series 5). No evidence of abdominal aortic dissection or periaortic stranding. Celiac: Redemonstrated separate origins of the common hepatic and splenic arteries in lieu of a conventional celiac trunk. There is a minimal amount of eccentric noncalcified atherosclerotic plaque involving the origin of the takeoff of the common hepatic artery, not resulting in hemodynamically significant stenosis. The splenic artery is widely patent without hemodynamically significant stenosis. SMA: There is a minimal amount of eccentric calcified atherosclerotic plaque involving the origin and main trunk of the SMA, not resulting in hemodynamically significant stenosis. Conventional branching pattern. The distal tributaries the SMA appear widely patent without discrete lumen filling defect. Renals: Note is made of 3 separate left-sided renal arteries there is a moderate amount of eccentric predominantly calcified atherosclerotic plaque involving the origin of the dominant left renal artery not definitely resulting in hemodynamically significant  stenosis. The solitary right renal artery is widely patent without hemodynamically significant narrowing. No vessel irregularity to suggest FMD. IMA: Disease at its origin though remains patent. Inflow: Redemonstrated aneurysmal dilatation  the distal aspect the left common iliac artery measuring 1.8 cm in diameter (image 11, series 5). The bilateral common iliac arteries are disease though without a definitive hemodynamically significant stenosis. The bilateral internal iliac arteries are disease though patent and of normal caliber. Bilateral external iliac arteries are mildly disease though patent and of caliber. Proximal Outflow: There is a minimal amount of calcified atherosclerotic plaque involving the bilateral common femoral arteries, not resulting in hemodynamically significant stenosis. The imaged portions of the bilateral deep and superficial femoral arteries are of caliber and widely patent without hemodynamically significant narrowing. Veins: The IVC appears widely patent as does the portal venous system. Review of the MIP images confirms the above findings. _________________________________________________________ NON-VASCULAR Lower chest: Limited visualization of lower thorax demonstrates minimal dependent subpleural ground-glass atelectasis. The approximately 0.7 cm subpleural nodule within the right middle lobe (image 22, series 9), is unchanged since at least the 06/2017 examination and thus a benign etiology. No pleural effusion or pneumothorax. Normal heart size. No pericardial effusion. Hepatobiliary: Normal hepatic contour. Redemonstrated mixed attenuating mass within the central aspect of the liver measuring approximately 3.9 x 3.3 cm (image 19, series 6). Redemonstrated approximately 1.3 x 1.0 cm hypoattenuating lesion within the left lobe of the liver with adjacent Gel-Foam embolization material (representative images 18 and 19, series 6). Interval development of an adjacent intraparenchymal hematomas with medial component measuring approximately 2.2 x 1.3 cm (image 22, series 6) and dominant component within the more lateral aspect of the lateral segment of left lobe of the liver measuring approximately 2.8 x 2.1 cm (image 17,  series 6) and appears to extend to the liver capsule (image 19, series 6) with associated adjacent subcapsular hematoma. This finding is associated with minimal pooling of contrast within the central aspect of the hematoma (image 17, series 6). No definitive intra part parenchymal extension however note is made of a small amount of perisplenic fluid. Pancreas: Normal appearance of the pancreas. Spleen: Note is made of a small amount of perisplenic fluid. Adrenals/Urinary Tract: There is symmetric enhancement of the bilateral kidneys. No evidence of nephrolithiasis on this postcontrast examination. No discrete renal lesions. There is a minimal amount of likely age and body habitus related bilateral perinephric stranding. No urine obstruction. Normal appearance of the bilateral adrenal glands. The urinary bladder is underdistended. Stomach/Bowel: Colonic diverticulosis without evidence of superimposed acute diverticulitis. Moderate colonic stool burden. Sequela right hemicolectomy without evidence of enteric obstruction. Small hiatal hernia. No pneumoperitoneum, pneumatosis or portal venous gas. Lymphatic: No bulky retroperitoneal, mesenteric, pelvic or inguinal lymphadenopathy. Reproductive: Post hysterectomy. No discrete adnexal lesions. Trace amount of fluid within the pelvic cul-de-sac. Other: Nodular thickening involving the subcutaneous tissues of the undersurface of the pannus, likely at the location of subcutaneous medication administration. Minimal amount of subcutaneous edema about midline of the low back. Musculoskeletal: No acute or aggressive osseous abnormalities. Stigmata of dish within the thoracic spine. Posteriorly directed disc osteophyte complex at T12-L1. IMPRESSION: Vascular Impression: 1. Post ultrasound-guided left sided liver lesion biopsy complicated by development of intraparenchymal hematomas. There is pooling of contrast within the dominant more laterally located hematoma with apparent  extension of the hematoma to the liver capsule edge with associated adjacent subcapsular hemorrhage. 2. Small amount of perisplenic fluid. Otherwise, no definitive intraparenchymal extension. 3. Large amount of  atherosclerotic plaque including a suspected hemodynamically significant stenosis within the infrarenal abdominal aorta. Aortic Atherosclerosis (ICD10-I70.0). 4. 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 5. Non conventional arterial anatomy including separate origins of the common hepatic and splenic arteries in lieu of a conventional celiac trunk as detailed above. Nonvascular Impression: 1. Image grossly unchanged appearance of known dominant infiltrative mass within the central aspect of the liver, compatible with biopsy-proven hepatocellular carcinoma. 2. Sequela of right hemicolectomy without evidence of enteric obstruction. 3. Colonic diverticulosis without evidence of superimposed acute diverticulitis. 4. Small hiatal hernia. PLAN: Given above findings the decision was made to admit the patient for observation as she fortunately remained hemodynamically stable with subjective improvement in her transiently worsening abdominal pain. We will monitor the patient's H and H and keep the patient NPO in case arteriogram and embolization is ultimately required. Electronically Signed   By: Sandi Mariscal M.D.   On: 11/30/2020 09:47     All images have been reviewed by me personally.  EKG: Independently reviewed.   Assessment/Plan Principal Problem:   Lesion of liver Active Problems:   Colon cancer (HCC)   History of pulmonary embolism   Type 2 diabetes mellitus (HCC)   Rheumatoid arthritis (HCC)   HTN (hypertension)   HCC (hepatocellular carcinoma) (HCC)   Hemorrhage   Acute blood loss anemia   Right leg DVT (HCC)   Chronic diarrhea   RA (rheumatoid arthritis) (HCC)   Obesity, Class III, BMI 40-49.9 (morbid obesity)  ([place])   CKD (chronic kidney disease), stage III ([place])   Assessment   Plan  # Acute blood loss anemia # Liver lesions s/p liver lesion biopsy complicated by development of intraparenchymal hematomas. - patient reports feeling ok,  - HGB trending 13.5--> 10.2--> 10.3-->8.7--> 9.1 thus far. - continue monitor HH Q8h. Consider PRBC if HH< 8.0 or she becomes symptomatic.  - hematomas management defer to Radiology  - Hospitalist team will continue to follow.       # RLL DVT  No doppler study is noted in EPIC. Doppler study to r/o LE DVT since she is temporarily off Xarelto due to above hematomas.   LE doppler 12/01/20 showed age indeterminate DVT involving the right common femoral vein, and SF junction.  - Patient denies RLE redness, pain or swelling - I have discussed with primary team radiology about this finding. Will ask radiology to review the imaging to determine whether this is acute vs chronic.  -The management defer to radiology. ? Monitor patient vs resume AC vs IVC filter.   # Hx of unprovoked PE  04/04/2017 Chest CT showed -Large incidental PE in LLL pulmonary artery - smaller embolus noted at RIGHT pulmonary artery Bifurcation.  10/25/2020 Chest CT showed Incidental acute right lower lobe pulmonary embolism, extending to the lobar level.   - followed up by oncology as outpatient  - per chart review, She was treated with Xarelto 20 mg for a year in 2018, and Dose was reduced to 10 mg daily in October 2019 ? Due to bruising . Dose was increased to 20 mg po daily on 11/22/18 - last dose Xarelto was on 4/12.  - Xarelto is currently on hold and defer to Radiology as to when to resume Xarelto   # T2DM # HTN # CKD stage III, baseline Cr 1.1-1.4 - HGB A1C 6.5 - Glu AC and HS - SSI - Cr stable at baseline and continue to  monitor - on home Coreg with holding perimeter- hold if SBP < 120 and HR < 60.     # Chronic diarrhea  - home imodium as needed  # RA on MTX  -  home MTX  20 mg every Sunday - will hold MTX given mildly elevated AST/ALT. CMP in am  - continue Folic acid    #Hepatocellular carcinoma s/p bland hepatic embolization 07/24/20 in IR , Stage IIa (CT3CN0) colon cancer s/p Right hemicolectomy on 10/10/2016  - follow up with oncology as outpatient  # morbid obesity Body mass index is 42.33 kg/m.  - weight loss per PCP as outpatient                  DVT prophylaxis: SCD to left LE Code Status: Full code Family Communication: daughter at bedside Consults called: none Admission status: obs  Status is: Observation  The patient remains OBS appropriate and will d/c before 2 midnights.  Dispo: The patient is from: Home              Anticipated d/c is to: Home              Patient currently is not medically stable to d/c.   Difficult to place patient No       Time Spent on consult: 80 minutes.  >50% of the time was devoted to discussing the patients care, assessment, plan and disposition with other care givers along with counseling the patient about the risks and benefits of treatment.    Charlann Lange MD [facility]  If 7PM-7AM, please contact night-coverage   12/01/2020, 12:58 PM

## 2020-12-01 NOTE — Progress Notes (Signed)
Referring Physician(s): Conservation officer, historic buildings Physician: Mir, Biochemist, clinical  Patient Status:  [facility] - In-pt  Chief Complaint:  Abdominal pain post liver mass biopsy 4/14, hepatocellular carcinoma  Subjective: Pt states that she feels better this morning; currently denies fever,HA,CP,worsening dyspnea, cough, back pain,N/V or visible bleeding; she does cont to have some mild diffuse abd tenderness, primarily epigastric; she has not been OOB today; daughter in room   Allergies: Adhesive [tape], Neosporin original [bacitracin-neomycin-polymyxin], Sulfa antibiotics, and Erythromycin  Medications: Prior to Admission medications   Medication Sig Start Date End Date Taking? Authorizing Provider  acetaminophen (TYLENOL) 500 MG tablet Take 500 mg by mouth every 6 (six) hours as needed (for pain.).   Yes [provider]  APPLE CIDER VINEGAR PO Take 450 mg by mouth daily.   Yes [provider]  Calcium Carbonate-Vitamin D3 600-400 MG-UNIT TABS Take 1 tablet by mouth daily.   Yes [provider]  carvedilol (COREG) 6.25 MG tablet Take 6.25 mg by mouth 2 (two) times daily with a meal.   Yes [provider]  folic acid (FOLVITE) 1 MG tablet Take 1 mg by mouth daily.   Yes [provider]  furosemide (LASIX) 40 MG tablet Take 20 mg by mouth daily as needed for edema. 07/24/19  Yes [provider]  loperamide (IMODIUM) 2 MG capsule Take 2 mg by mouth daily.   Yes [provider]  magnesium gluconate (MAGONATE) 500 MG tablet Take 500 mg by mouth daily as needed (cramps).   Yes [provider]  methotrexate (RHEUMATREX) 2.5 MG tablet Take 20 mg by mouth every Sunday. Caution:Chemotherapy. Protect from light.   Yes [provider]  Multiple Vitamins-Minerals (CENTRUM SILVER PO) Take 1 tablet by mouth daily.   Yes [provider]  NOVOLOG MIX 70/30 FLEXPEN (70-30) 100 UNIT/ML FlexPen Inject 20-30 Units into  the skin See admin instructions. Take 30 units in the am and Take 20 units in the evening 09/14/16  Yes [provider]  omeprazole (PRILOSEC) 40 MG capsule Take 40 mg by mouth daily.  11/17/16  Yes [provider]  rivaroxaban (XARELTO) 20 MG TABS tablet Take 1 tablet (20 mg total) by mouth daily with supper. 11/21/20  Yes Derek Jack, MD  rOPINIRole (REQUIP) 1 MG tablet Take 1 mg by mouth every morning. MAY TAKE 2 ADDITIONAL DOSES  AS NEEDED   Yes [provider]  Saline (AYR SALINE NASAL DROPS) 0.65 % SOLN Place 1 spray into the nose daily as needed (congestion).   Yes [provider]  vitamin E 180 MG (400 UNITS) capsule Take 400 Units by mouth daily.   Yes [provider]     Vital Signs: BP (!) 117/46 (BP Location: Right Arm)   Pulse 79   Temp 98.2 F (36.8 C) (Axillary)   Resp 14   Ht 5\' 3"  (1.6 m)   Wt 238 lb 15.7 oz (108.4 kg)   SpO2 93%   BMI 42.33 kg/m   Physical Exam awake/alert; chest- sl dim BS bases; heart- RRR; abd- soft,few BS, some mild diffuse tenderness to palpation, primarily epigastric; no sig LE edema  Imaging: CT ABDOMEN PELVIS WO CONTRAST  Result Date: 11/30/2020 CLINICAL DATA:  Known history of colorectal cancer and hepatocellular carcinoma, now with acute onset of abdominal pain following liver lesion biopsy. EXAM: CT ABDOMEN AND PELVIS WITHOUT CONTRAST TECHNIQUE: Multidetector CT imaging of the abdomen and pelvis was performed following the standard protocol without IV contrast. COMPARISON:  CT abdomen and pelvis-10/25/2020; 09/16/2018 FINDINGS: The lack of intravenous contrast limits the ability to evaluate solid abdominal organs. Lower chest: Limited visualization of the lower thorax demonstrates minimal dependent subpleural ground-glass atelectasis. Unchanged punctate (0.6 cm) sub pleural nodule within the right middle lobe is unchanged since at least the 08/2018 examination and thus of benign etiology. No  discrete focal airspace opacities. No pleural effusion. Normal heart size. No pericardial effusion. Calcifications of the mitral valve annulus. Hepatobiliary: Normal hepatic contour. Known infiltrative mass within the central aspect of the liver appears grossly unchanged. There is a minimal amount intraparenchymal air along the tract from the biopsy adjacent to the ill-defined mixed attenuating lesion within the left lobe of the liver (image 19 and 20, series 3). Interval development of an adjacent area of hypo attenuation within the subcapsular aspect of the lateral segment of the left lobe of the liver (image 17, series 3 with associated adjacent subcapsular hypoattenuating fluid which demonstrates Hounsfield units suggestive of hemorrhage (image 18, series 3. No definitive intraparenchymal extension of suspected subcapsular hemorrhage though note is made of a small amount perisplenic fluid. Post cholecystectomy. Pancreas: Normal noncontrast appearance of the pancreas. Spleen: Note is made of a small amount of perisplenic fluid which demonstrates Hounsfield units of 22 (image 20, series 3) Adrenals/Urinary Tract: Mild atrophy of the bilateral kidneys. No renal stones. There is a minimal amount of likely age and body habitus related perinephric stranding. No urine obstruction Normal noncontrast appearance the bilateral adrenal glands. Normal appearance of the urinary bladder given underdistention. Stomach/Bowel: Moderate to large colonic stool burden without evidence of enteric obstruction. Scattered colonic diverticulosis without evidence of superimposed acute diverticulitis. Stable sequela of right hemicolectomy without evidence of enteric obstruction. No discrete areas of bowel wall thickening. Small hiatal hernia. No pneumoperitoneum, pneumatosis or portal venous gas. Vascular/Lymphatic: Extensive atherosclerotic plaque within normal caliber abdominal aorta. No bulky retroperitoneal, mesenteric, pelvic or  inguinal lymphadenopathy on this noncontrast examination. Reproductive: Post hysterectomy. No discrete adnexal lesions. There is a trace amount of fluid in the pelvic cul-de-sac. Other: Somewhat nodular foci of subcutaneous stranding within the abdominal pannus likely represents the location of subcutaneous medication administration. Mild diffuse body wall anasarca. Musculoskeletal: No definite acute or aggressive osseous abnormalities. Moderate multilevel lumbar spine DDD is suspected though incompletely evaluated. IMPRESSION: 1. Suspected development of intra parenchymal hematoma with associated subcapsular extension following ultrasound-guided liver lesion biopsy. No definite intra parenchymal extension however note is made of a small amount of perisplenic fluid. 2. Similar appearance of known infiltrative mass with the central aspects of the liver. 3. Stable sequela of right hemicolectomy without evidence of enteric obstruction. 4. Aortic Atherosclerosis (ICD10-I70.0). PLAN: Given above, patient subsequently underwent multiphase contrast-enhanced CT. Electronically Signed   By: Sandi Mariscal M.D.   On: 11/30/2020 09:11   US BIOPSY (LIVER)  Result Date: 11/29/2020 INDICATION: History of hepatocellular carcinoma as well as colon cancer, now with indeterminate liver lesions. Please perform ultrasound-guided liver lesion biopsy for tissue diagnostic purposes. EXAM: ULTRASOUND GUIDED LIVER LESION BIOPSY COMPARISON:  CT abdomen pelvis-10/26/2020; abdominal MRI-09/21/2020 MEDICATIONS: None ANESTHESIA/SEDATION: Fentanyl 50 mcg IV; Versed 1 mg IV Total Moderate Sedation time:  15 Minutes. The patient's level of consciousness and vital signs were monitored continuously by radiology nursing throughout the procedure under my direct supervision. COMPLICATIONS: None immediate. PROCEDURE: Informed written consent was obtained from the patient after a discussion of the risks, benefits and alternatives to treatment. The  patient understands and consents the procedure. A timeout was  performed prior to the initiation of the procedure. Ultrasound scanning was performed of the right upper abdominal quadrant demonstrates an approximately 1.5 x 1.4 cm hypoechoic lesion within the left lobe of the liver correlating with the hypervascular lesion seen on preceding abdominal CT image 28, series 3. The procedure was planned. The midline of the upper abdomen was prepped and draped in the usual sterile fashion. The overlying soft tissues were anesthetized with 1% lidocaine with epinephrine. A 17 gauge, 6.8 cm co-axial needle was advanced into a peripheral aspect of the lesion. This was followed by 4 core biopsies with an 18 gauge core device under direct ultrasound guidance. The coaxial needle tract was embolized with a small amount of Gel-Foam slurry and superficial hemostasis was obtained with manual compression. Post procedural scanning was negative for definitive area of hemorrhage or additional complication. A dressing was placed. The patient tolerated the procedure well without immediate post procedural complication. IMPRESSION: Technically successful ultrasound guided core needle biopsy of indeterminate lesion within the left lobe of the liver. Electronically Signed   By: Sandi Mariscal M.D.   On: 11/29/2020 14:26   CT Angio Abd/Pel w/ and/or w/o  Result Date: 11/30/2020 CLINICAL DATA:  Concern for intraparenchymal hemorrhage with subcapsular extension preceding noncontrast CT following ultrasound-guided liver lesion biopsy. EXAM: CTA ABDOMEN AND PELVIS WITHOUT AND WITH CONTRAST TECHNIQUE: Multidetector CT imaging of the abdomen and pelvis was performed using the standard protocol during bolus administration of intravenous contrast. Multiplanar reconstructed images and MIPs were obtained and reviewed to evaluate the vascular anatomy. CONTRAST:  130mL OMNIPAQUE IOHEXOL 350 MG/ML SOLN COMPARISON:  Noncontrast CT scan of the abdomen and  pelvis-earlier same day; CT abdomen pelvis-10/25/2020; 03/29/2019; chest CT-06/23/2017 FINDINGS: VASCULAR Aorta: Moderate to large amount of eccentric calcified atherosclerotic plaque throughout the normal caliber abdominal aorta. Calcified atherosclerotic plaque at the origin of the takeoff of the accessory left renal artery results in approximately 50% luminal narrowing (image 63, series 5). No evidence of abdominal aortic dissection or periaortic stranding. Celiac: Redemonstrated separate origins of the common hepatic and splenic arteries in lieu of a conventional celiac trunk. There is a minimal amount of eccentric noncalcified atherosclerotic plaque involving the origin of the takeoff of the common hepatic artery, not resulting in hemodynamically significant stenosis. The splenic artery is widely patent without hemodynamically significant stenosis. SMA: There is a minimal amount of eccentric calcified atherosclerotic plaque involving the origin and main trunk of the SMA, not resulting in hemodynamically significant stenosis. Conventional branching pattern. The distal tributaries the SMA appear widely patent without discrete lumen filling defect. Renals: Note is made of 3 separate left-sided renal arteries there is a moderate amount of eccentric predominantly calcified atherosclerotic plaque involving the origin of the dominant left renal artery not definitely resulting in hemodynamically significant stenosis. The solitary right renal artery is widely patent without hemodynamically significant narrowing. No vessel irregularity to suggest FMD. IMA: Disease at its origin though remains patent. 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 The bilateral common iliac arteries are disease though without a definitive hemodynamically significant stenosis. The bilateral internal iliac arteries are disease though patent and  of normal caliber. Bilateral external iliac arteries are mildly disease though patent and of caliber. Proximal Outflow: There is a minimal amount of calcified atherosclerotic plaque involving the bilateral common femoral arteries, not resulting in hemodynamically significant stenosis. The imaged portions of the bilateral deep and superficial femoral arteries  are of caliber and widely patent without hemodynamically significant narrowing. Veins: The IVC appears widely patent as does the portal venous system. Review of the MIP images confirms the above findings. _________________________________________________________ NON-VASCULAR Lower chest: Limited visualization of lower thorax demonstrates minimal dependent subpleural ground-glass atelectasis. The approximately 0.7 cm subpleural nodule within the right middle lobe (image 22, series 9), is unchanged since at least the 06/2017 examination and thus a benign etiology. No pleural effusion or pneumothorax. Normal heart size. No pericardial effusion. Hepatobiliary: Normal hepatic contour. Redemonstrated mixed attenuating mass within the central aspect of the liver measuring approximately 3.9 x 3.3 cm (image 19, series 6). Redemonstrated approximately 1.3 x 1.0 cm hypoattenuating lesion within the left lobe of the liver with adjacent Gel-Foam embolization material (representative images 18 and 19, series 6). Interval development of an adjacent intraparenchymal hematomas with medial component measuring approximately 2.2 x 1.3 cm (image 22, series 6) and dominant component within the more lateral aspect of the lateral segment of left lobe of the liver measuring approximately 2.8 x 2.1 cm (image 17, series 6) and appears to extend to the liver capsule (image 19, series 6) with associated adjacent subcapsular hematoma. This finding is associated with minimal pooling of contrast within the central aspect of the hematoma (image 17, series 6). No definitive intra part  parenchymal extension however note is made of a small amount of perisplenic fluid. Pancreas: Normal appearance of the pancreas. Spleen: Note is made of a small amount of perisplenic fluid. Adrenals/Urinary Tract: There is symmetric enhancement of the bilateral kidneys. No evidence of nephrolithiasis on this postcontrast examination. No discrete renal lesions. There is a minimal amount of likely age and body habitus related bilateral perinephric stranding. No urine obstruction. Normal appearance of the bilateral adrenal glands. The urinary bladder is underdistended. Stomach/Bowel: Colonic diverticulosis without evidence of superimposed acute diverticulitis. Moderate colonic stool burden. Sequela right hemicolectomy without evidence of enteric obstruction. Small hiatal hernia. No pneumoperitoneum, pneumatosis or portal venous gas. Lymphatic: No bulky retroperitoneal, mesenteric, pelvic or inguinal lymphadenopathy. Reproductive: Post hysterectomy. No discrete adnexal lesions. Trace amount of fluid within the pelvic cul-de-sac. Other: Nodular thickening involving the subcutaneous tissues of the undersurface of the pannus, likely at the location of subcutaneous medication administration. Minimal amount of subcutaneous edema about midline of the low back. Musculoskeletal: No acute or aggressive osseous abnormalities. Stigmata of dish within the thoracic spine. Posteriorly directed disc osteophyte complex at T12-L1. IMPRESSION: Vascular Impression: 1. Post ultrasound-guided left sided liver lesion biopsy complicated by development of intraparenchymal hematomas. There is pooling of contrast within the dominant more laterally located hematoma with apparent extension of the hematoma to the liver capsule edge with associated adjacent subcapsular hemorrhage. 2. Small amount of perisplenic fluid. Otherwise, no definitive intraparenchymal extension. 3. Large amount of atherosclerotic plaque including a suspected hemodynamically  significant stenosis within the infrarenal abdominal aorta. Aortic Atherosclerosis (ICD10-I70.0). 4. 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 5. Non conventional arterial anatomy including separate origins of the common hepatic and splenic arteries in lieu of a conventional celiac trunk as detailed above. Nonvascular Impression: 1. Image grossly unchanged appearance of known dominant infiltrative mass within the central aspect of the liver, compatible with biopsy-proven hepatocellular carcinoma. 2. Sequela of right hemicolectomy without evidence of enteric obstruction. 3. Colonic diverticulosis without evidence of superimposed acute diverticulitis. 4. Small hiatal hernia. PLAN: Given above findings the decision was made to admit the patient for observation as  she fortunately remained hemodynamically stable with subjective improvement in her transiently worsening abdominal pain. We will monitor the patient's H and H and keep the patient NPO in case arteriogram and embolization is ultimately required. Electronically Signed   By: Sandi Mariscal M.D.   On: 11/30/2020 09:47    Labs:  CBC: Recent Labs    11/29/20 1129 11/30/20 0106 11/30/20 0924 12/01/20 0050  WBC 9.2 10.1 10.5 8.0  HGB 13.5 10.2* 10.3* 8.7*  HCT 42.0 31.2* 32.0* 26.4*  PLT 200 170 172 137*    COAGS: Recent Labs    11/29/20 1129  INR 1.0    BMP: Recent Labs    12/26/19 1008 04/02/20 0909 09/19/20 1032 10/23/20 1004 11/21/20 1249 12/01/20 0050  NA 139 139  --  139 140 138  K 3.8 4.8  --  4.3 5.0 4.5  CL 102 104  --  105 104 108  CO2 29 26  --  25 29 26   GLUCOSE 113* 198*  --  224* 92 143*  BUN 23 20 18 19 19 22   CALCIUM 9.0 9.5  --  9.4 9.3 8.2*  CREATININE 1.00 1.14*  --  1.04* 1.12* 1.17*  GFRNONAA 55* 47*  --  56* 51* 48*  GFRAA >60 54*  --   --   --   --     LIVER FUNCTION TESTS: Recent Labs    04/02/20 0909 10/23/20 1004  11/21/20 1249 12/01/20 0050  BILITOT 0.6 0.9 0.7 0.8  AST 29 26 25  62*  ALT 30 25 23  66*  ALKPHOS 46 41 41 31*  PROT 6.8 6.5 7.1 4.8*  ALBUMIN 3.8 3.6 3.6 2.5*    Assessment and Plan: Pt with PMH sig for HLD,HTN, DM, GERD, fibromyalgia, PE 2018, depression,  colon ca (prior rt hemicolectomy 2018) as well as HCC, s/p bland hepatic embolization in 2021, who presented as OP on 4/14 for image guided bx of indeterminate left lobe liver mass; bx complicated by intraparenchymal hepatic hematomas with assoc subcapsular extension, drop in hgb/BP/epigastric pain; CT angio with findings of:  1. Post ultrasound-guided left sided liver lesion biopsy complicated by development of intraparenchymal hematomas. There is pooling of contrast within the dominant more laterally located hematoma with apparent extension of the hematoma to the liver capsule edge with associated adjacent subcapsular hemorrhage. 2. Small amount of perisplenic fluid. Otherwise, no definitive intraparenchymal extension. 3. Large amount of atherosclerotic plaque including a suspected hemodynamically significant stenosis within the infrarenal abdominal aorta. Aortic Atherosclerosis (ICD10-I70.0). 4. 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 5. Non conventional arterial anatomy including separate origins of the common hepatic and splenic arteries in lieu of a conventional celiac trunk as detailed above.  Latest labs WBC nl, hgb 8.7(10.3), plts 137k, creat 1.17, t bili nl, additional LFT's up as expected; cont IV hydration, monitor BP/symptoms closely, serial CBC checks- consider transfusion if hgb dips below 8, recheck renal fxn in am; consult [facility] for assistance with medical management   Electronically Signed: D. Rowe Robert, PA-C 12/01/2020, 9:20 AM   I spent a total of 20 minutes at the the patient's bedside AND on the patient's hospital floor or unit,  greater than 50% of which was counseling/coordinating care for image guided liver mass biopsy with post biopsy intraparenchymal hepatic hematomas    Patient ID: Wanda Curry, female   DOB: [birth year], 75 y.o.   MRN: [id number]

## 2020-12-01 NOTE — Plan of Care (Signed)
  Problem: Health Behavior/Discharge Planning: Goal: Ability to manage health-related needs will improve Outcome: Progressing   Problem: Clinical Measurements: Goal: Ability to maintain clinical measurements within normal limits will improve Outcome: Progressing Goal: Will remain free from infection Outcome: Progressing Goal: Diagnostic test results will improve Outcome: Progressing Goal: Respiratory complications will improve Outcome: Progressing Goal: Cardiovascular complication will be avoided Outcome: Progressing   Problem: Pain Managment: Goal: General experience of comfort will improve Outcome: Progressing   Problem: Safety: Goal: Ability to remain free from injury will improve Outcome: Progressing

## 2020-12-01 NOTE — Progress Notes (Signed)
VASCULAR LAB    Bilateral lower extremity venous dupelx has been performed.  See CV proc for preliminary results.  Paged  Dr. Nicoletta Dress with results.  Scarlettrose Costilow, RVT 12/01/2020, 10:43 AM

## 2020-12-02 DIAGNOSIS — R58 Hemorrhage, not elsewhere classified: Secondary | ICD-10-CM | POA: Diagnosis present

## 2020-12-02 DIAGNOSIS — I82511 Chronic embolism and thrombosis of right femoral vein: Secondary | ICD-10-CM | POA: Diagnosis present

## 2020-12-02 DIAGNOSIS — Z20822 Contact with and (suspected) exposure to covid-19: Secondary | ICD-10-CM | POA: Diagnosis present

## 2020-12-02 DIAGNOSIS — E785 Hyperlipidemia, unspecified: Secondary | ICD-10-CM | POA: Diagnosis present

## 2020-12-02 DIAGNOSIS — K769 Liver disease, unspecified: Secondary | ICD-10-CM | POA: Diagnosis not present

## 2020-12-02 DIAGNOSIS — Z85038 Personal history of other malignant neoplasm of large intestine: Secondary | ICD-10-CM | POA: Diagnosis not present

## 2020-12-02 DIAGNOSIS — Z9049 Acquired absence of other specified parts of digestive tract: Secondary | ICD-10-CM | POA: Diagnosis not present

## 2020-12-02 DIAGNOSIS — Z86711 Personal history of pulmonary embolism: Secondary | ICD-10-CM | POA: Diagnosis not present

## 2020-12-02 DIAGNOSIS — Y92239 Unspecified place in hospital as the place of occurrence of the external cause: Secondary | ICD-10-CM | POA: Diagnosis present

## 2020-12-02 DIAGNOSIS — M797 Fibromyalgia: Secondary | ICD-10-CM | POA: Diagnosis present

## 2020-12-02 DIAGNOSIS — Y848 Other medical procedures as the cause of abnormal reaction of the patient, or of later complication, without mention of misadventure at the time of the procedure: Secondary | ICD-10-CM | POA: Diagnosis present

## 2020-12-02 DIAGNOSIS — D62 Acute posthemorrhagic anemia: Secondary | ICD-10-CM | POA: Diagnosis not present

## 2020-12-02 DIAGNOSIS — Z881 Allergy status to other antibiotic agents status: Secondary | ICD-10-CM | POA: Diagnosis not present

## 2020-12-02 DIAGNOSIS — Z6841 Body Mass Index (BMI) 40.0 and over, adult: Secondary | ICD-10-CM | POA: Diagnosis not present

## 2020-12-02 DIAGNOSIS — Z79899 Other long term (current) drug therapy: Secondary | ICD-10-CM | POA: Diagnosis not present

## 2020-12-02 DIAGNOSIS — Z882 Allergy status to sulfonamides status: Secondary | ICD-10-CM | POA: Diagnosis not present

## 2020-12-02 DIAGNOSIS — C220 Liver cell carcinoma: Secondary | ICD-10-CM | POA: Diagnosis present

## 2020-12-02 DIAGNOSIS — Z90710 Acquired absence of both cervix and uterus: Secondary | ICD-10-CM | POA: Diagnosis not present

## 2020-12-02 DIAGNOSIS — E1122 Type 2 diabetes mellitus with diabetic chronic kidney disease: Secondary | ICD-10-CM | POA: Diagnosis present

## 2020-12-02 DIAGNOSIS — M069 Rheumatoid arthritis, unspecified: Secondary | ICD-10-CM | POA: Diagnosis present

## 2020-12-02 DIAGNOSIS — N179 Acute kidney failure, unspecified: Secondary | ICD-10-CM | POA: Diagnosis not present

## 2020-12-02 DIAGNOSIS — Z87891 Personal history of nicotine dependence: Secondary | ICD-10-CM | POA: Diagnosis not present

## 2020-12-02 DIAGNOSIS — Z7901 Long term (current) use of anticoagulants: Secondary | ICD-10-CM | POA: Diagnosis not present

## 2020-12-02 DIAGNOSIS — K91870 Postprocedural hematoma of a digestive system organ or structure following a digestive system procedure: Secondary | ICD-10-CM | POA: Diagnosis present

## 2020-12-02 DIAGNOSIS — Z85828 Personal history of other malignant neoplasm of skin: Secondary | ICD-10-CM | POA: Diagnosis not present

## 2020-12-02 DIAGNOSIS — Z96651 Presence of right artificial knee joint: Secondary | ICD-10-CM | POA: Diagnosis present

## 2020-12-02 DIAGNOSIS — K219 Gastro-esophageal reflux disease without esophagitis: Secondary | ICD-10-CM | POA: Diagnosis present

## 2020-12-02 LAB — COMPREHENSIVE METABOLIC PANEL
ALT: 52 U/L — ABNORMAL HIGH (ref 0–44)
AST: 35 U/L (ref 15–41)
Albumin: 2.4 g/dL — ABNORMAL LOW (ref 3.5–5.0)
Alkaline Phosphatase: 33 U/L — ABNORMAL LOW (ref 38–126)
Anion gap: 5 (ref 5–15)
BUN: 19 mg/dL (ref 8–23)
CO2: 25 mmol/L (ref 22–32)
Calcium: 8.1 mg/dL — ABNORMAL LOW (ref 8.9–10.3)
Chloride: 108 mmol/L (ref 98–111)
Creatinine, Ser: 1.06 mg/dL — ABNORMAL HIGH (ref 0.44–1.00)
GFR, Estimated: 54 mL/min — ABNORMAL LOW (ref >60)
Glucose, Bld: 175 mg/dL — ABNORMAL HIGH (ref 70–99)
Potassium: 4.2 mmol/L (ref 3.5–5.1)
Sodium: 138 mmol/L (ref 135–145)
Total Bilirubin: 0.5 mg/dL (ref 0.3–1.2)
Total Protein: 5.1 g/dL — ABNORMAL LOW (ref 6.5–8.1)

## 2020-12-02 LAB — CBC WITH DIFFERENTIAL/PLATELET
Abs Immature Granulocytes: 0.02 10*3/uL (ref 0.00–0.07)
Abs Immature Granulocytes: 0.04 10*3/uL (ref 0.00–0.07)
Abs Immature Granulocytes: 0.05 10*3/uL (ref 0.00–0.07)
Basophils Absolute: 0 10*3/uL (ref 0.0–0.1)
Basophils Absolute: 0 10*3/uL (ref 0.0–0.1)
Basophils Absolute: 0 10*3/uL (ref 0.0–0.1)
Basophils Relative: 0 %
Basophils Relative: 0 %
Basophils Relative: 0 %
Eosinophils Absolute: 0.1 10*3/uL (ref 0.0–0.5)
Eosinophils Absolute: 0.2 10*3/uL (ref 0.0–0.5)
Eosinophils Absolute: 0.2 10*3/uL (ref 0.0–0.5)
Eosinophils Relative: 2 %
Eosinophils Relative: 2 %
Eosinophils Relative: 2 %
HCT: 25.6 % — ABNORMAL LOW (ref 36.0–46.0)
HCT: 28.2 % — ABNORMAL LOW (ref 36.0–46.0)
HCT: 28.7 % — ABNORMAL LOW (ref 36.0–46.0)
Hemoglobin: 8.3 g/dL — ABNORMAL LOW (ref 12.0–15.0)
Hemoglobin: 9 g/dL — ABNORMAL LOW (ref 12.0–15.0)
Hemoglobin: 9.4 g/dL — ABNORMAL LOW (ref 12.0–15.0)
Immature Granulocytes: 0 %
Immature Granulocytes: 1 %
Immature Granulocytes: 1 %
Lymphocytes Relative: 25 %
Lymphocytes Relative: 24 %
Lymphocytes Relative: 18 %
Lymphs Abs: 1.8 10*3/uL (ref 0.7–4.0)
Lymphs Abs: 1.9 10*3/uL (ref 0.7–4.0)
Lymphs Abs: 1.5 10*3/uL (ref 0.7–4.0)
MCH: 31.9 pg (ref 26.0–34.0)
MCH: 31.8 pg (ref 26.0–34.0)
MCH: 32.1 pg (ref 26.0–34.0)
MCHC: 32.4 g/dL (ref 30.0–36.0)
MCHC: 31.9 g/dL (ref 30.0–36.0)
MCHC: 32.8 g/dL (ref 30.0–36.0)
MCV: 98.5 fL (ref 80.0–100.0)
MCV: 99.6 fL (ref 80.0–100.0)
MCV: 98 fL (ref 80.0–100.0)
Monocytes Absolute: 0.9 10*3/uL (ref 0.1–1.0)
Monocytes Absolute: 0.9 10*3/uL (ref 0.1–1.0)
Monocytes Absolute: 0.9 10*3/uL (ref 0.1–1.0)
Monocytes Relative: 12 %
Monocytes Relative: 11 %
Monocytes Relative: 11 %
Neutro Abs: 4.4 10*3/uL (ref 1.7–7.7)
Neutro Abs: 4.7 10*3/uL (ref 1.7–7.7)
Neutro Abs: 5.4 10*3/uL (ref 1.7–7.7)
Neutrophils Relative %: 61 %
Neutrophils Relative %: 62 %
Neutrophils Relative %: 68 %
Platelets: 132 10*3/uL — ABNORMAL LOW (ref 150–400)
Platelets: 136 10*3/uL — ABNORMAL LOW (ref 150–400)
Platelets: 137 10*3/uL — ABNORMAL LOW (ref 150–400)
RBC: 2.6 MIL/uL — ABNORMAL LOW (ref 3.87–5.11)
RBC: 2.83 MIL/uL — ABNORMAL LOW (ref 3.87–5.11)
RBC: 2.93 MIL/uL — ABNORMAL LOW (ref 3.87–5.11)
RDW: 13.5 % (ref 11.5–15.5)
RDW: 13.6 % (ref 11.5–15.5)
RDW: 13.6 % (ref 11.5–15.5)
WBC: 7.2 10*3/uL (ref 4.0–10.5)
WBC: 7.7 10*3/uL (ref 4.0–10.5)
WBC: 8 10*3/uL (ref 4.0–10.5)
nRBC: 0 % (ref 0.0–0.2)
nRBC: 0 % (ref 0.0–0.2)
nRBC: 0 % (ref 0.0–0.2)

## 2020-12-02 LAB — GLUCOSE, CAPILLARY
Glucose-Capillary: 169 mg/dL — ABNORMAL HIGH (ref 70–99)
Glucose-Capillary: 159 mg/dL — ABNORMAL HIGH (ref 70–99)
Glucose-Capillary: 189 mg/dL — ABNORMAL HIGH (ref 70–99)
Glucose-Capillary: 200 mg/dL — ABNORMAL HIGH (ref 70–99)

## 2020-12-02 LAB — BRAIN NATRIURETIC PEPTIDE: B Natriuretic Peptide: 103.4 pg/mL — ABNORMAL HIGH (ref 0.0–100.0)

## 2020-12-02 MED ORDER — RIVAROXABAN 10 MG PO TABS
10.0000 mg | ORAL_TABLET | Freq: Every day | ORAL | Status: DC
  Administered 2020-12-02 – 2020-12-03 (×2): 10 mg via ORAL
  Filled 2020-12-02 (×2): qty 1

## 2020-12-02 NOTE — Evaluation (Signed)
Physical Therapy Evaluation Patient Details Name: Wanda Curry MRN: [id number] DOB: 03-22-1946 Today's Date: 12/02/2020   History of Present Illness  Wanda Curry is a 75 y/o female who was admitted on 11/29/20 following a scheduled liver lesion biopsy. CT negative for acute findings. Negative for DVT on L. Age indeterminate DVT in R common femoral vein. PMH includes RA, fibromyalgia, GERD, DM, HLD, hx of unprovoked PE, colon cancer s/phemicolectomy, and hepatocellular carcinoma.    Clinical Impression  Pt received in bed, cooperative and pleasant. Generally supervision to min guard for mobility. No overt LOB during transfers and pt fairly steady without AD. However pt does report she uses hand held assist from daughter to ambulate longer distances in community, so pt may benefit from UE support for ambulation. Session limited to transfer to chair only due to age indeterminate R DVT needing further workup. Plan to progress pt and ambulate as appropriate. Pt would benefit from PT to improve balance, decrease risk for falls, and increase independency. Will continue to follow acutely.    Follow Up Recommendations Other (comment);Home health PT (Pt likely to decline as she does not feel she will need further PT following discharge.)    Equipment Recommendations  None recommended by PT    Recommendations for Other Services       Precautions / Restrictions Precautions Precautions: Fall Precaution Comments: On 12/02/20, per MD transfers from bed to chair only until further workup for R LE DVT. Restrictions Weight Bearing Restrictions: No      Mobility  Bed Mobility Overal bed mobility: Needs Assistance Bed Mobility: Supine to Sit     Supine to sit: Supervision          Transfers Overall transfer level: Needs assistance Equipment used: None Transfers: Sit to/from Stand Sit to Stand: Min guard            Ambulation/Gait             General Gait Details:  deferred  Stairs            Wheelchair Mobility    Modified Rankin (Stroke Patients Only)       Balance Overall balance assessment: Needs assistance   Sitting balance-Leahy Scale: Good       Standing balance-Leahy Scale: Fair                               Pertinent Vitals/Pain Pain Assessment: No/denies pain    Home Living Family/patient expects to be discharged to:: Private residence Living Arrangements: Alone (Lives with cat) Available Help at Discharge: Family;Available PRN/intermittently Type of Home: House   Entrance Stairs-Rails: Can reach both;Left;Right Entrance Stairs-Number of Steps: 2 Home Layout: One level;Other (Comment) (has 1-2 steps between den and other rooms) Home Equipment: Clinical cytogeneticist - 2 wheels;Bedside commode;Cane - single point;Wheelchair - manual      Prior Function Level of Independence: Independent         Comments: Does not use AD but cautious. If leaves house, goes with family member who can provide assistance/hand. Daughter lives 15-20 min away, neighbor across the street who can provide immediate help if needed. One fall within last 6 months where pt had episode of diarrhea and then felt dizzy in kitchen and fell.     Hand Dominance   Dominant Hand: Right    Extremity/Trunk Assessment   Upper Extremity Assessment Upper Extremity Assessment: Defer to OT evaluation    Lower Extremity Assessment Lower Extremity  Assessment: Overall WFL for tasks assessed    Cervical / Trunk Assessment Cervical / Trunk Assessment: Kyphotic  Communication   Communication: No difficulties  Cognition Arousal/Alertness: Awake/alert Behavior During Therapy: WFL for tasks assessed/performed Overall Cognitive Status: Within Functional Limits for tasks assessed                                        General Comments General comments (skin integrity, edema, etc.): VSS on RA    Exercises      Assessment/Plan    PT Assessment Patient needs continued PT services  PT Problem List Decreased strength;Decreased knowledge of use of DME;Decreased activity tolerance;Decreased balance;Decreased mobility       PT Treatment Interventions DME instruction;Balance training;Gait training;Neuromuscular re-education;Stair training;Functional mobility training;Patient/family education;Therapeutic activities;Therapeutic exercise    PT Goals (Current goals can be found in the Care Plan section)  Acute Rehab PT Goals Patient Stated Goal: go home, see cat PT Goal Formulation: With patient Time For Goal Achievement: 12/23/20 Potential to Achieve Goals: Good    Frequency Min 3X/week   Barriers to discharge        Co-evaluation               AM-PAC PT "6 Clicks" Mobility  Outcome Measure Help needed turning from your back to your side while in a flat bed without using bedrails?: A Little Help needed moving from lying on your back to sitting on the side of a flat bed without using bedrails?: A Little Help needed moving to and from a bed to a chair (including a wheelchair)?: A Little Help needed standing up from a chair using your arms (e.g., wheelchair or bedside chair)?: A Little Help needed to walk in hospital room?: A Little Help needed climbing 3-5 steps with a railing? : A Lot 6 Click Score: 17    End of Session Equipment Utilized During Treatment: Gait belt Activity Tolerance: Patient tolerated treatment well Patient left: in chair;with call bell/phone within reach;with family/visitor present;Other (comment) (MD present) Nurse Communication: Mobility status PT Visit Diagnosis: Unsteadiness on feet (R26.81)    Time:  -      Charges:         Rosita Kea, SPT

## 2020-12-02 NOTE — Progress Notes (Addendum)
Referring Physician(s): Firefighter  Supervising Physician: Mir, Biochemist, clinical  Patient Status:  [facility] - In-pt  Chief Complaint: Abdominal pain/bleed post liver mass biopsy 4/14, hepatocellular carcinoma   Subjective: Patient just recently finished showering; did feel a little weak but denied dizziness or presyncope; currently denies fever, headache, chest pain, dyspnea, cough, worsening abdominal/back pain, nausea, vomiting or visible bleeding   Allergies: Adhesive [tape], Neosporin original [bacitracin-neomycin-polymyxin], Sulfa antibiotics, and Erythromycin  Medications: Prior to Admission medications   Medication Sig Start Date End Date Taking? Authorizing Provider  acetaminophen (TYLENOL) 500 MG tablet Take 500 mg by mouth every 6 (six) hours as needed (for pain.).   Yes [provider]  APPLE CIDER VINEGAR PO Take 450 mg by mouth daily.   Yes [provider]  Calcium Carbonate-Vitamin D3 600-400 MG-UNIT TABS Take 1 tablet by mouth daily.   Yes [provider]  carvedilol (COREG) 6.25 MG tablet Take 6.25 mg by mouth 2 (two) times daily with a meal.   Yes [provider]  folic acid (FOLVITE) 1 MG tablet Take 1 mg by mouth daily.   Yes [provider]  furosemide (LASIX) 40 MG tablet Take 20 mg by mouth daily as needed for edema. 07/24/19  Yes [provider]  loperamide (IMODIUM) 2 MG capsule Take 2 mg by mouth daily.   Yes [provider]  magnesium gluconate (MAGONATE) 500 MG tablet Take 500 mg by mouth daily as needed (cramps).   Yes [provider]  methotrexate (RHEUMATREX) 2.5 MG tablet Take 20 mg by mouth every Sunday. Caution:Chemotherapy. Protect from light.   Yes [provider]  Multiple Vitamins-Minerals (CENTRUM SILVER PO) Take 1 tablet by mouth daily.   Yes [provider]  NOVOLOG MIX 70/30 FLEXPEN (70-30) 100 UNIT/ML FlexPen Inject 20-30 Units into the skin See admin  instructions. Take 30 units in the am and Take 20 units in the evening 09/14/16  Yes [provider]  omeprazole (PRILOSEC) 40 MG capsule Take 40 mg by mouth daily.  11/17/16  Yes [provider]  rivaroxaban (XARELTO) 20 MG TABS tablet Take 1 tablet (20 mg total) by mouth daily with supper. 11/21/20  Yes Derek Jack, MD  rOPINIRole (REQUIP) 1 MG tablet Take 1 mg by mouth every morning. MAY TAKE 2 ADDITIONAL DOSES  AS NEEDED   Yes [provider]  Saline (AYR SALINE NASAL DROPS) 0.65 % SOLN Place 1 spray into the nose daily as needed (congestion).   Yes [provider]  vitamin E 180 MG (400 UNITS) capsule Take 400 Units by mouth daily.   Yes [provider]     Vital Signs: BP (!) 117/45 (BP Location: Right Arm)   Pulse 77   Temp 98.8 F (37.1 C) (Axillary)   Resp 18   Ht 5\' 3"  (1.6 m)   Wt 238 lb 1.6 oz (108 kg)   SpO2 95%   BMI 42.18 kg/m   Physical Exam awake, alert.  Chest with slightly diminished breath sounds bases.  Heart with regular rate and rhythm, abdomen soft, positive bowel sounds, not significantly tender, puncture site epigastric region without ecchymosis; trace pretibial edema on the right, no significant left lower extremity edema.  Imaging: CT ABDOMEN PELVIS WO CONTRAST  Result Date: 11/30/2020 CLINICAL DATA:  Known history of colorectal cancer and hepatocellular carcinoma, now with acute onset of abdominal pain following liver lesion biopsy. EXAM: CT ABDOMEN AND PELVIS WITHOUT CONTRAST TECHNIQUE: Multidetector CT imaging of the abdomen  and pelvis was performed following the standard protocol without IV contrast. COMPARISON:  CT abdomen and pelvis-10/25/2020; 09/16/2018 FINDINGS: The lack of intravenous contrast limits the ability to evaluate solid abdominal organs. Lower chest: Limited visualization of the lower thorax demonstrates minimal dependent subpleural ground-glass atelectasis. Unchanged punctate (0.6 cm) sub  pleural nodule within the right middle lobe is unchanged since at least the 08/2018 examination and thus of benign etiology. No discrete focal airspace opacities. No pleural effusion. Normal heart size. No pericardial effusion. Calcifications of the mitral valve annulus. Hepatobiliary: Normal hepatic contour. Known infiltrative mass within the central aspect of the liver appears grossly unchanged. There is a minimal amount intraparenchymal air along the tract from the biopsy adjacent to the ill-defined mixed attenuating lesion within the left lobe of the liver (image 19 and 20, series 3). Interval development of an adjacent area of hypo attenuation within the subcapsular aspect of the lateral segment of the left lobe of the liver (image 17, series 3 with associated adjacent subcapsular hypoattenuating fluid which demonstrates Hounsfield units suggestive of hemorrhage (image 18, series 3. No definitive intraparenchymal extension of suspected subcapsular hemorrhage though note is made of a small amount perisplenic fluid. Post cholecystectomy. Pancreas: Normal noncontrast appearance of the pancreas. Spleen: Note is made of a small amount of perisplenic fluid which demonstrates Hounsfield units of 22 (image 20, series 3) Adrenals/Urinary Tract: Mild atrophy of the bilateral kidneys. No renal stones. There is a minimal amount of likely age and body habitus related perinephric stranding. No urine obstruction Normal noncontrast appearance the bilateral adrenal glands. Normal appearance of the urinary bladder given underdistention. Stomach/Bowel: Moderate to large colonic stool burden without evidence of enteric obstruction. Scattered colonic diverticulosis without evidence of superimposed acute diverticulitis. Stable sequela of right hemicolectomy without evidence of enteric obstruction. No discrete areas of bowel wall thickening. Small hiatal hernia. No pneumoperitoneum, pneumatosis or portal venous gas.  Vascular/Lymphatic: Extensive atherosclerotic plaque within normal caliber abdominal aorta. No bulky retroperitoneal, mesenteric, pelvic or inguinal lymphadenopathy on this noncontrast examination. Reproductive: Post hysterectomy. No discrete adnexal lesions. There is a trace amount of fluid in the pelvic cul-de-sac. Other: Somewhat nodular foci of subcutaneous stranding within the abdominal pannus likely represents the location of subcutaneous medication administration. Mild diffuse body wall anasarca. Musculoskeletal: No definite acute or aggressive osseous abnormalities. Moderate multilevel lumbar spine DDD is suspected though incompletely evaluated. IMPRESSION: 1. Suspected development of intra parenchymal hematoma with associated subcapsular extension following ultrasound-guided liver lesion biopsy. No definite intra parenchymal extension however note is made of a small amount of perisplenic fluid. 2. Similar appearance of known infiltrative mass with the central aspects of the liver. 3. Stable sequela of right hemicolectomy without evidence of enteric obstruction. 4. Aortic Atherosclerosis (ICD10-I70.0). PLAN: Given above, patient subsequently underwent multiphase contrast-enhanced CT. Electronically Signed   By: Sandi Mariscal M.D.   On: 11/30/2020 09:11   US BIOPSY (LIVER)  Result Date: 11/29/2020 INDICATION: History of hepatocellular carcinoma as well as colon cancer, now with indeterminate liver lesions. Please perform ultrasound-guided liver lesion biopsy for tissue diagnostic purposes. EXAM: ULTRASOUND GUIDED LIVER LESION BIOPSY COMPARISON:  CT abdomen pelvis-10/26/2020; abdominal MRI-09/21/2020 MEDICATIONS: None ANESTHESIA/SEDATION: Fentanyl 50 mcg IV; Versed 1 mg IV Total Moderate Sedation time:  15 Minutes. The patient's level of consciousness and vital signs were monitored continuously by radiology nursing throughout the procedure under my direct supervision. COMPLICATIONS: None immediate.  PROCEDURE: Informed written consent was obtained from the patient after a discussion of the risks, benefits and  alternatives to treatment. The patient understands and consents the procedure. A timeout was performed prior to the initiation of the procedure. Ultrasound scanning was performed of the right upper abdominal quadrant demonstrates an approximately 1.5 x 1.4 cm hypoechoic lesion within the left lobe of the liver correlating with the hypervascular lesion seen on preceding abdominal CT image 28, series 3. The procedure was planned. The midline of the upper abdomen was prepped and draped in the usual sterile fashion. The overlying soft tissues were anesthetized with 1% lidocaine with epinephrine. A 17 gauge, 6.8 cm co-axial needle was advanced into a peripheral aspect of the lesion. This was followed by 4 core biopsies with an 18 gauge core device under direct ultrasound guidance. The coaxial needle tract was embolized with a small amount of Gel-Foam slurry and superficial hemostasis was obtained with manual compression. Post procedural scanning was negative for definitive area of hemorrhage or additional complication. A dressing was placed. The patient tolerated the procedure well without immediate post procedural complication. IMPRESSION: Technically successful ultrasound guided core needle biopsy of indeterminate lesion within the left lobe of the liver. Electronically Signed   By: Sandi Mariscal M.D.   On: 11/29/2020 14:26   VAS [place] LOWER EXTREMITY VENOUS (DVT)  Result Date: 12/02/2020  Lower Venous DVT Study Indications: Pulmonary embolism 10/25/20.  Risk Factors: Cancer newly diagnosed hepatocellular carcinoma 11/29/20. Limitations: Body habitus. Comparison Study: No prior study Performing Technologist: Sharion Dove RVS  Examination Guidelines: A complete evaluation includes B-mode imaging, spectral Doppler, color Doppler, and power Doppler as needed of all accessible portions of each vessel. Bilateral  testing is considered an integral part of a complete examination. Limited examinations for reoccurring indications may be performed as noted. The reflux portion of the exam is performed with the patient in reverse Trendelenburg.  +---------+---------------+---------+-----------+----------+-------------------+ RIGHT    CompressibilityPhasicitySpontaneityPropertiesThrombus Aging      +---------+---------------+---------+-----------+----------+-------------------+ CFV      Partial        No       No                   Age Indeterminate   +---------+---------------+---------+-----------+----------+-------------------+ SFJ      Partial                                      Age Indeterminate   +---------+---------------+---------+-----------+----------+-------------------+ FV Prox  Full                                                             +---------+---------------+---------+-----------+----------+-------------------+ FV Mid   Full                                                             +---------+---------------+---------+-----------+----------+-------------------+ FV DistalFull                                                             +---------+---------------+---------+-----------+----------+-------------------+  PFV      Full                                                             +---------+---------------+---------+-----------+----------+-------------------+ POP      Full           Yes      Yes                                      +---------+---------------+---------+-----------+----------+-------------------+ PTV      Full                                                             +---------+---------------+---------+-----------+----------+-------------------+ PERO                                                  Not well visualized +---------+---------------+---------+-----------+----------+-------------------+    +---------+---------------+---------+-----------+----------+-------------------+ LEFT     CompressibilityPhasicitySpontaneityPropertiesThrombus Aging      +---------+---------------+---------+-----------+----------+-------------------+ CFV      Full           Yes      Yes                                      +---------+---------------+---------+-----------+----------+-------------------+ SFJ      Full                                                             +---------+---------------+---------+-----------+----------+-------------------+ FV Prox  Full                                                             +---------+---------------+---------+-----------+----------+-------------------+ FV Mid   Full                                                             +---------+---------------+---------+-----------+----------+-------------------+ FV DistalFull                                                             +---------+---------------+---------+-----------+----------+-------------------+ PFV      Full                                                             +---------+---------------+---------+-----------+----------+-------------------+  POP      Full           Yes      Yes                                      +---------+---------------+---------+-----------+----------+-------------------+ PTV      Full                                                             +---------+---------------+---------+-----------+----------+-------------------+ PERO                                                  Not well visualized +---------+---------------+---------+-----------+----------+-------------------+    Summary: RIGHT: - Findings consistent with age indeterminate deep vein thrombosis involving the right common femoral vein, and SF junction.  LEFT: - There is no evidence of deep vein thrombosis in the lower extremity. However, portions of this  examination were limited- see technologist comments above.  *See table(s) above for measurements and observations. Electronically signed by Servando Snare MD on 12/02/2020 at 9:00:54 AM.    Final    CT Angio Abd/Pel w/ and/or w/o  Result Date: 11/30/2020 CLINICAL DATA:  Concern for intraparenchymal hemorrhage with subcapsular extension preceding noncontrast CT following ultrasound-guided liver lesion biopsy. EXAM: CTA ABDOMEN AND PELVIS WITHOUT AND WITH CONTRAST TECHNIQUE: Multidetector CT imaging of the abdomen and pelvis was performed using the standard protocol during bolus administration of intravenous contrast. Multiplanar reconstructed images and MIPs were obtained and reviewed to evaluate the vascular anatomy. CONTRAST:  152mL OMNIPAQUE IOHEXOL 350 MG/ML SOLN COMPARISON:  Noncontrast CT scan of the abdomen and pelvis-earlier same day; CT abdomen pelvis-10/25/2020; 03/29/2019; chest CT-06/23/2017 FINDINGS: VASCULAR Aorta: Moderate to large amount of eccentric calcified atherosclerotic plaque throughout the normal caliber abdominal aorta. Calcified atherosclerotic plaque at the origin of the takeoff of the accessory left renal artery results in approximately 50% luminal narrowing (image 63, series 5). No evidence of abdominal aortic dissection or periaortic stranding. Celiac: Redemonstrated separate origins of the common hepatic and splenic arteries in lieu of a conventional celiac trunk. There is a minimal amount of eccentric noncalcified atherosclerotic plaque involving the origin of the takeoff of the common hepatic artery, not resulting in hemodynamically significant stenosis. The splenic artery is widely patent without hemodynamically significant stenosis. SMA: There is a minimal amount of eccentric calcified atherosclerotic plaque involving the origin and main trunk of the SMA, not resulting in hemodynamically significant stenosis. Conventional branching pattern. The distal tributaries the SMA appear  widely patent without discrete lumen filling defect. Renals: Note is made of 3 separate left-sided renal arteries there is a moderate amount of eccentric predominantly calcified atherosclerotic plaque involving the origin of the dominant left renal artery not definitely resulting in hemodynamically significant stenosis. The solitary right renal artery is widely patent without hemodynamically significant narrowing. No vessel irregularity to suggest FMD. IMA: Disease at its origin though remains patent. 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 The bilateral common iliac arteries are disease though  without a definitive hemodynamically significant stenosis. The bilateral internal iliac arteries are disease though patent and of normal caliber. Bilateral external iliac arteries are mildly disease though patent and of caliber. Proximal Outflow: There is a minimal amount of calcified atherosclerotic plaque involving the bilateral common femoral arteries, not resulting in hemodynamically significant stenosis. The imaged portions of the bilateral deep and superficial femoral arteries are of caliber and widely patent without hemodynamically significant narrowing. Veins: The IVC appears widely patent as does the portal venous system. Review of the MIP images confirms the above findings. _________________________________________________________ NON-VASCULAR Lower chest: Limited visualization of lower thorax demonstrates minimal dependent subpleural ground-glass atelectasis. The approximately 0.7 cm subpleural nodule within the right middle lobe (image 22, series 9), is unchanged since at least the 06/2017 examination and thus a benign etiology. No pleural effusion or pneumothorax. Normal heart size. No pericardial effusion. Hepatobiliary: Normal hepatic contour. Redemonstrated mixed attenuating mass within the central aspect of  the liver measuring approximately 3.9 x 3.3 cm (image 19, series 6). Redemonstrated approximately 1.3 x 1.0 cm hypoattenuating lesion within the left lobe of the liver with adjacent Gel-Foam embolization material (representative images 18 and 19, series 6). Interval development of an adjacent intraparenchymal hematomas with medial component measuring approximately 2.2 x 1.3 cm (image 22, series 6) and dominant component within the more lateral aspect of the lateral segment of left lobe of the liver measuring approximately 2.8 x 2.1 cm (image 17, series 6) and appears to extend to the liver capsule (image 19, series 6) with associated adjacent subcapsular hematoma. This finding is associated with minimal pooling of contrast within the central aspect of the hematoma (image 17, series 6). No definitive intra part parenchymal extension however note is made of a small amount of perisplenic fluid. Pancreas: Normal appearance of the pancreas. Spleen: Note is made of a small amount of perisplenic fluid. Adrenals/Urinary Tract: There is symmetric enhancement of the bilateral kidneys. No evidence of nephrolithiasis on this postcontrast examination. No discrete renal lesions. There is a minimal amount of likely age and body habitus related bilateral perinephric stranding. No urine obstruction. Normal appearance of the bilateral adrenal glands. The urinary bladder is underdistended. Stomach/Bowel: Colonic diverticulosis without evidence of superimposed acute diverticulitis. Moderate colonic stool burden. Sequela right hemicolectomy without evidence of enteric obstruction. Small hiatal hernia. No pneumoperitoneum, pneumatosis or portal venous gas. Lymphatic: No bulky retroperitoneal, mesenteric, pelvic or inguinal lymphadenopathy. Reproductive: Post hysterectomy. No discrete adnexal lesions. Trace amount of fluid within the pelvic cul-de-sac. Other: Nodular thickening involving the subcutaneous tissues of the undersurface of the  pannus, likely at the location of subcutaneous medication administration. Minimal amount of subcutaneous edema about midline of the low back. Musculoskeletal: No acute or aggressive osseous abnormalities. Stigmata of dish within the thoracic spine. Posteriorly directed disc osteophyte complex at T12-L1. IMPRESSION: Vascular Impression: 1. Post ultrasound-guided left sided liver lesion biopsy complicated by development of intraparenchymal hematomas. There is pooling of contrast within the dominant more laterally located hematoma with apparent extension of the hematoma to the liver capsule edge with associated adjacent subcapsular hemorrhage. 2. Small amount of perisplenic fluid. Otherwise, no definitive intraparenchymal extension. 3. Large amount of atherosclerotic plaque including a suspected hemodynamically significant stenosis within the infrarenal abdominal aorta. Aortic Atherosclerosis (ICD10-I70.0). 4. 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 5. Non conventional arterial anatomy including separate origins of the common hepatic and splenic arteries in lieu of a conventional celiac trunk  as detailed above. Nonvascular Impression: 1. Image grossly unchanged appearance of known dominant infiltrative mass within the central aspect of the liver, compatible with biopsy-proven hepatocellular carcinoma. 2. Sequela of right hemicolectomy without evidence of enteric obstruction. 3. Colonic diverticulosis without evidence of superimposed acute diverticulitis. 4. Small hiatal hernia. PLAN: Given above findings the decision was made to admit the patient for observation as she fortunately remained hemodynamically stable with subjective improvement in her transiently worsening abdominal pain. We will monitor the patient's H and H and keep the patient NPO in case arteriogram and embolization is ultimately required. Electronically Signed   By: Sandi Mariscal  M.D.   On: 11/30/2020 09:47    Labs:  CBC: Recent Labs    12/01/20 0929 12/01/20 1646 12/02/20 0216 12/02/20 1000  WBC 8.1 6.8 7.2 7.7  HGB 9.1* 8.7* 8.3* 9.0*  HCT 27.9* 27.4* 25.6* 28.2*  PLT 138* 125* 132* 136*    COAGS: Recent Labs    11/29/20 1129 12/01/20 0929  INR 1.0 1.1    BMP: Recent Labs    12/26/19 1008 04/02/20 0909 09/19/20 1032 10/23/20 1004 11/21/20 1249 12/01/20 0050 12/02/20 0216  NA 139 139  --  139 140 138 138  K 3.8 4.8  --  4.3 5.0 4.5 4.2  CL 102 104  --  105 104 108 108  CO2 29 26  --  25 29 26 25   GLUCOSE 113* 198*  --  224* 92 143* 175*  BUN 23 20   < > 19 19 22 19   CALCIUM 9.0 9.5  --  9.4 9.3 8.2* 8.1*  CREATININE 1.00 1.14*  --  1.04* 1.12* 1.17* 1.06*  GFRNONAA 55* 47*  --  56* 51* 48* 54*  GFRAA >60 54*  --   --   --   --   --    < > = values in this interval not displayed.    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 Post ultrasound-guided left sided liver lesion biopsy complicated by development of intraparenchymal hematomas. There is pooling of contrast within the dominant more laterally located hematoma with apparent extension of the hematoma to the liver capsule edge with associated adjacent subcapsular  hemorrhage. 2. Small amount of perisplenic fluid. Otherwise, no definitive intraparenchymal extension. 3. Large amount of atherosclerotic plaque including a suspected hemodynamically significant stenosis within the infrarenal abdominal aorta. Aortic Atherosclerosis (ICD10-I70.0). 4. 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 5. 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 Dwaine Gale; no current indication for IVC filter at this time; latest labs include hemoglobin of 8.3 early this morning followed by repeat at 10:00 of 9; WBC normal, platelets 136k, creatinine 1.06, total bilirubin 0.5; IV fluids have been discontinued; latest BP 146/55 ; we will plan to resume Xarelto at 10 mg dose starting tonight; continue to closely monitor hemoglobin; greatly appreciate assistance of [facility] with medical management; patient also working with PT     Electronically Signed: D. Rowe Robert, PA-C 12/02/2020, 11:50 AM   I spent a total of 20 minutes at the the patient's bedside AND on the patient's hospital floor or unit, greater than 50% of which was counseling/coordinating care for status post liver mass biopsy with subsequent bleed/hematomas    Patient ID: Wanda Curry, female   DOB: 12-Apr-1946, 75 y.o.   MRN: [id number]

## 2020-12-02 NOTE — Plan of Care (Signed)
  Problem: Health Behavior/Discharge Planning: Goal: Ability to manage health-related needs will improve Outcome: Progressing   Problem: Clinical Measurements: Goal: Ability to maintain clinical measurements within normal limits will improve Outcome: Progressing Goal: Will remain free from infection Outcome: Progressing Goal: Diagnostic test results will improve Outcome: Progressing Goal: Respiratory complications will improve Outcome: Progressing Goal: Cardiovascular complication will be avoided Outcome: Progressing   Problem: Pain Managment: Goal: General experience of comfort will improve Outcome: Progressing

## 2020-12-02 NOTE — Progress Notes (Signed)
Consult  Progress Note                                                                                                                                                                                                          Patient Demographics:    Wanda Curry, is a 75 y.o. female, DOB - 1946/05/18, QPY:[id number]  Outpatient Primary MD for the patient is Earney Mallet, MD    LOS - 0  Admit date - 11/29/2020    No chief complaint on file.      Brief Narrative (HPI from H&P)   Wanda Curry is a 75 y.o. female with medical history significant of PE on Xarelto, Hepatocellular carcinoma s/p bland hepatic embolization 07/24/20 in IR , Stage IIa (CT3CN0) colon cancer s/p Right hemicolectomy on 10/10/2016, T2DM, HLD, chronic diarrhea, RA on MTX who presented on 11/29/20 for liver lesion biopsy, post biopsy she developed abdominal pain and lightheadedness and was found to have post biopsy intra-abdominal bleeding small from the site of biopsy.  She was admitted by IR & [facility] was requested to consult for medical management.   Subjective:    Wanda Curry today has, No headache, No chest pain, No abdominal pain - No Nausea, No new weakness tingling or numbness, no SOB.   Assessment  & Plan :     1.  Post liver biopsy development of intraparenchymal liver bleed causing acute blood loss anemia.  Her baseline hemoglobin seems to be around 13.5 but it has fortunately stabilized around 9.  No further signs of ongoing bleeding continue to monitor cautiously.  2.  History of DVT PE.  She is baseline on Xarelto, have discussed the case with patients attending IR physician Dr. Dwaine Gale on 12/02/20  who will either restart Xarelto or place an IVC filter based on his clinical judgment.   3.  History of hepatocellular carcinoma s/p hepatic embolization in December 2021, stage IIa colon cancer status post right hemicolectomy.  Outpatient oncology and  PCP follow-up.  4.  Chronic diarrhea.  Supportive care.  5.  History of RA.  On methotrexate.  Outpatient resumption of medication and monitoring of LFTs by PCP and rheumatologist.  6.  CKD stage III A baseline creatinine around 1.3.  She is close to baseline or better.  7.  Hypertension.  Placed on Coreg.  We will continue to monitor.  8.  GERD.  On PPI.  9.  Restless leg syndrome.  Continue Requip.  10.  DM type II.  On sliding scale will monitor and adjust.  Lab Results  Component Value Date   HGBA1C 6.5 (H) 11/29/2020   CBG (last 3)  Recent Labs    11/29/20 2212 11/30/20 0816 11/30/20 1211  GLUCAP 199* 151* 168*          Condition - Guarded  Family Communication  : per Primary team IR  Code Status :  Ful  Consults  :  TRH consulting for IR  PUD Prophylaxis :    Procedures  :     Leg US - Findings consistent with age indeterminate deep vein thrombosis involving the right common femoral vein, and SF junction      Disposition Plan  :    Status is: Observation  DVT Prophylaxis  :  Per IR primary team  Lab Results  Component Value Date   PLT 136 (L) 12/02/2020    Diet :  Diet Order            Diet regular Room service appropriate? Yes; Fluid consistency: Thin  Diet effective now                  Inpatient Medications  Scheduled Meds: . carvedilol  6.25 mg Oral BID WC  . docusate sodium  100 mg Oral BID  . folic acid  1 mg Oral Daily  . insulin aspart  0-15 Units Subcutaneous TID WC  . insulin aspart  0-5 Units Subcutaneous QHS  . insulin aspart  4 Units Subcutaneous TID WC  . methotrexate  20 mg Oral Q Sun  . pantoprazole  40 mg Oral Daily  . rOPINIRole  1 mg Oral BH-q7a   Continuous Infusions: PRN Meds:.acetaminophen, HYDROcodone-acetaminophen, loperamide, ondansetron (ZOFRAN) IV, sodium chloride  Antibiotics  :    Anti-infectives (From admission, onward)   None       Time Spent in minutes  30   Lala Lund M.D on  12/02/2020 at 10:46 AM  To page go to [email or web address]   [facility] -  Office  [phone]      See all Orders from today for further details    Objective:   Vitals:   12/01/20 0800 12/01/20 1435 12/01/20 2013 12/02/20 0420  BP: (!) 109/46 (!) 120/43 (!) 122/49 (!) 117/45  Pulse: 81 82 82 77  Resp: 18 (!) 21 14 18   Temp:  98.2 F (36.8 C) 97.9 F (36.6 C) 98.8 F (37.1 C)  TempSrc:  Oral Oral Axillary  SpO2: 95% 98% 96% 95%  Weight:    108 kg  Height:        Wt Readings from Last 3 Encounters:  12/02/20 108 kg  11/21/20 103.3 kg  07/25/19 118.5 kg     Intake/Output Summary (Last 24 hours) at 12/02/2020 1046 Last data filed at 12/01/2020 2100 Gross per 24 hour  Intake 420 ml  Output 1400 ml  Net -980 ml     Physical Exam  Awake Alert, No new F.N deficits, Normal affect [place].AT,PERRAL Supple Neck,No JVD, No cervical lymphadenopathy appriciated.  Symmetrical Chest wall movement, Good air movement bilaterally, CTAB RRR,No Gallops,Rubs or new Murmurs, No Parasternal Heave +ve B.Sounds, Abd Soft, No tenderness, No organomegaly appriciated, No rebound - guarding or rigidity. No Cyanosis, Clubbing or edema, No new Rash or bruise     Data Review:    CBC Recent Labs  Lab 12/01/20  4481 12/01/20 0929 12/01/20 1646 12/02/20 0216 12/02/20 1000  WBC 8.0 8.1 6.8 7.2 7.7  HGB 8.7* 9.1* 8.7* 8.3* 9.0*  HCT 26.4* 27.9* 27.4* 25.6* 28.2*  PLT 137* 138* 125* 132* 136*  MCV 97.1 97.2 98.2 98.5 99.6  MCH 32.0 31.7 31.2 31.9 31.8  MCHC 33.0 32.6 31.8 32.4 31.9  RDW 13.4 13.6 13.5 13.5 13.6  LYMPHSABS 1.9 1.9 1.7 1.8 1.9  MONOABS 0.9 1.0 1.0 0.9 0.9  EOSABS 0.1 0.1 0.1 0.1 0.2  BASOSABS 0.0 0.0 0.0 0.0 0.0    Recent Labs  Lab 11/29/20 1129 11/29/20 2118 12/01/20 0050 12/01/20 0929 12/02/20 0216  NA  --   --  138  --  138  K  --   --  4.5  --  4.2  CL  --   --  108  --  108  CO2  --   --  26  --  25  GLUCOSE  --   --  143*  --  175*  BUN  --   --   22  --  19  CREATININE  --   --  1.17*  --  1.06*  CALCIUM  --   --  8.2*  --  8.1*  AST  --   --  62*  --  35  ALT  --   --  66*  --  52*  ALKPHOS  --   --  31*  --  33*  BILITOT  --   --  0.8  --  0.5  ALBUMIN  --   --  2.5*  --  2.4*  INR 1.0  --   --  1.1  --   HGBA1C  --  6.5*  --   --   --     ------------------------------------------------------------------------------------------------------------------ No results for input(s): CHOL, HDL, LDLCALC, TRIG, CHOLHDL, LDLDIRECT in the last 72 hours.  Lab Results  Component Value Date   HGBA1C 6.5 (H) 11/29/2020   ------------------------------------------------------------------------------------------------------------------ No results for input(s): TSH, T4TOTAL, T3FREE, THYROIDAB in the last 72 hours.  Invalid input(s): FREET3  Cardiac Enzymes No results for input(s): CKMB, TROPONINI, MYOGLOBIN in the last 168 hours.  Invalid input(s): CK ------------------------------------------------------------------------------------------------------------------ No results found for: BNP  Micro Results Recent Results (from the past 240 hour(s))  SARS CORONAVIRUS 2 (TAT 6-24 HRS) Nasopharyngeal Nasopharyngeal Swab     Status: None   Collection Time: 12/01/20  9:04 AM   Specimen: Nasopharyngeal Swab  Result Value Ref Range Status   SARS Coronavirus 2 NEGATIVE NEGATIVE Final    Comment: (NOTE) SARS-CoV-2 target nucleic acids are NOT DETECTED.  The SARS-CoV-2 RNA is generally detectable in upper and lower respiratory specimens during the acute phase of infection. Negative results do not preclude SARS-CoV-2 infection, do not rule out co-infections with other pathogens, and should not be used as the sole basis for treatment or other patient management decisions. Negative results must be combined with clinical observations, patient history, and epidemiological information. The expected result is Negative.  Fact Sheet for  Patients: SugarRoll.be  Fact Sheet for Healthcare Providers: https://www.woods-mathews.com/  This test is not yet approved or cleared by the [place] FDA and  has been authorized for detection and/or diagnosis of SARS-CoV-2 by FDA under an Emergency Use Authorization (EUA). This EUA will remain  in effect (meaning this test can be used) for the duration of the COVID-19 declaration under Se ction 564(b)(1) of the Act, 21 U.S.C. section 360bbb-3(b)(1), unless the authorization is  terminated or revoked sooner.  Performed at [facility], [place] [address], [place], [place] [address]     Radiology Reports CT ABDOMEN PELVIS WO CONTRAST  Result Date: 11/30/2020 CLINICAL DATA:  Known history of colorectal cancer and hepatocellular carcinoma, now with acute onset of abdominal pain following liver lesion biopsy. EXAM: CT ABDOMEN AND PELVIS WITHOUT CONTRAST TECHNIQUE: Multidetector CT imaging of the abdomen and pelvis was performed following the standard protocol without IV contrast. COMPARISON:  CT abdomen and pelvis-10/25/2020; 09/16/2018 FINDINGS: The lack of intravenous contrast limits the ability to evaluate solid abdominal organs. Lower chest: Limited visualization of the lower thorax demonstrates minimal dependent subpleural ground-glass atelectasis. Unchanged punctate (0.6 cm) sub pleural nodule within the right middle lobe is unchanged since at least the 08/2018 examination and thus of benign etiology. No discrete focal airspace opacities. No pleural effusion. Normal heart size. No pericardial effusion. Calcifications of the mitral valve annulus. Hepatobiliary: Normal hepatic contour. Known infiltrative mass within the central aspect of the liver appears grossly unchanged. There is a minimal amount intraparenchymal air along the tract from the biopsy adjacent to the ill-defined mixed attenuating lesion within the left lobe of the liver (image 19  and 20, series 3). Interval development of an adjacent area of hypo attenuation within the subcapsular aspect of the lateral segment of the left lobe of the liver (image 17, series 3 with associated adjacent subcapsular hypoattenuating fluid which demonstrates Hounsfield units suggestive of hemorrhage (image 18, series 3. No definitive intraparenchymal extension of suspected subcapsular hemorrhage though note is made of a small amount perisplenic fluid. Post cholecystectomy. Pancreas: Normal noncontrast appearance of the pancreas. Spleen: Note is made of a small amount of perisplenic fluid which demonstrates Hounsfield units of 22 (image 20, series 3) Adrenals/Urinary Tract: Mild atrophy of the bilateral kidneys. No renal stones. There is a minimal amount of likely age and body habitus related perinephric stranding. No urine obstruction Normal noncontrast appearance the bilateral adrenal glands. Normal appearance of the urinary bladder given underdistention. Stomach/Bowel: Moderate to large colonic stool burden without evidence of enteric obstruction. Scattered colonic diverticulosis without evidence of superimposed acute diverticulitis. Stable sequela of right hemicolectomy without evidence of enteric obstruction. No discrete areas of bowel wall thickening. Small hiatal hernia. No pneumoperitoneum, pneumatosis or portal venous gas. Vascular/Lymphatic: Extensive atherosclerotic plaque within normal caliber abdominal aorta. No bulky retroperitoneal, mesenteric, pelvic or inguinal lymphadenopathy on this noncontrast examination. Reproductive: Post hysterectomy. No discrete adnexal lesions. There is a trace amount of fluid in the pelvic cul-de-sac. Other: Somewhat nodular foci of subcutaneous stranding within the abdominal pannus likely represents the location of subcutaneous medication administration. Mild diffuse body wall anasarca. Musculoskeletal: No definite acute or aggressive osseous abnormalities. Moderate  multilevel lumbar spine DDD is suspected though incompletely evaluated. IMPRESSION: 1. Suspected development of intra parenchymal hematoma with associated subcapsular extension following ultrasound-guided liver lesion biopsy. No definite intra parenchymal extension however note is made of a small amount of perisplenic fluid. 2. Similar appearance of known infiltrative mass with the central aspects of the liver. 3. Stable sequela of right hemicolectomy without evidence of enteric obstruction. 4. Aortic Atherosclerosis (ICD10-I70.0). PLAN: Given above, patient subsequently underwent multiphase contrast-enhanced CT. Electronically Signed   By: Sandi Mariscal M.D.   On: 11/30/2020 09:11   US BIOPSY (LIVER)  Result Date: 11/29/2020 INDICATION: History of hepatocellular carcinoma as well as colon cancer, now with indeterminate liver lesions. Please perform ultrasound-guided liver lesion biopsy for tissue diagnostic purposes. EXAM: ULTRASOUND GUIDED LIVER  LESION BIOPSY COMPARISON:  CT abdomen pelvis-10/26/2020; abdominal MRI-09/21/2020 MEDICATIONS: None ANESTHESIA/SEDATION: Fentanyl 50 mcg IV; Versed 1 mg IV Total Moderate Sedation time:  15 Minutes. The patient's level of consciousness and vital signs were monitored continuously by radiology nursing throughout the procedure under my direct supervision. COMPLICATIONS: None immediate. PROCEDURE: Informed written consent was obtained from the patient after a discussion of the risks, benefits and alternatives to treatment. The patient understands and consents the procedure. A timeout was performed prior to the initiation of the procedure. Ultrasound scanning was performed of the right upper abdominal quadrant demonstrates an approximately 1.5 x 1.4 cm hypoechoic lesion within the left lobe of the liver correlating with the hypervascular lesion seen on preceding abdominal CT image 28, series 3. The procedure was planned. The midline of the upper abdomen was prepped and draped  in the usual sterile fashion. The overlying soft tissues were anesthetized with 1% lidocaine with epinephrine. A 17 gauge, 6.8 cm co-axial needle was advanced into a peripheral aspect of the lesion. This was followed by 4 core biopsies with an 18 gauge core device under direct ultrasound guidance. The coaxial needle tract was embolized with a small amount of Gel-Foam slurry and superficial hemostasis was obtained with manual compression. Post procedural scanning was negative for definitive area of hemorrhage or additional complication. A dressing was placed. The patient tolerated the procedure well without immediate post procedural complication. IMPRESSION: Technically successful ultrasound guided core needle biopsy of indeterminate lesion within the left lobe of the liver. Electronically Signed   By: Sandi Mariscal M.D.   On: 11/29/2020 14:26   VAS [place] LOWER EXTREMITY VENOUS (DVT)  Result Date: 12/02/2020  Lower Venous DVT Study Indications: Pulmonary embolism 10/25/20.  Risk Factors: Cancer newly diagnosed hepatocellular carcinoma 11/29/20. Limitations: Body habitus. Comparison Study: No prior study Performing Technologist: Sharion Dove RVS  Examination Guidelines: A complete evaluation includes B-mode imaging, spectral Doppler, color Doppler, and power Doppler as needed of all accessible portions of each vessel. Bilateral testing is considered an integral part of a complete examination. Limited examinations for reoccurring indications may be performed as noted. The reflux portion of the exam is performed with the patient in reverse Trendelenburg.  +---------+---------------+---------+-----------+----------+-------------------+ RIGHT    CompressibilityPhasicitySpontaneityPropertiesThrombus Aging      +---------+---------------+---------+-----------+----------+-------------------+ CFV      Partial        No       No                   Age Indeterminate    +---------+---------------+---------+-----------+----------+-------------------+ SFJ      Partial                                      Age Indeterminate   +---------+---------------+---------+-----------+----------+-------------------+ FV Prox  Full                                                             +---------+---------------+---------+-----------+----------+-------------------+ FV Mid   Full                                                             +---------+---------------+---------+-----------+----------+-------------------+  FV DistalFull                                                             +---------+---------------+---------+-----------+----------+-------------------+ PFV      Full                                                             +---------+---------------+---------+-----------+----------+-------------------+ POP      Full           Yes      Yes                                      +---------+---------------+---------+-----------+----------+-------------------+ PTV      Full                                                             +---------+---------------+---------+-----------+----------+-------------------+ PERO                                                  Not well visualized +---------+---------------+---------+-----------+----------+-------------------+   +---------+---------------+---------+-----------+----------+-------------------+ LEFT     CompressibilityPhasicitySpontaneityPropertiesThrombus Aging      +---------+---------------+---------+-----------+----------+-------------------+ CFV      Full           Yes      Yes                                      +---------+---------------+---------+-----------+----------+-------------------+ SFJ      Full                                                             +---------+---------------+---------+-----------+----------+-------------------+ FV  Prox  Full                                                             +---------+---------------+---------+-----------+----------+-------------------+ FV Mid   Full                                                             +---------+---------------+---------+-----------+----------+-------------------+ FV DistalFull                                                             +---------+---------------+---------+-----------+----------+-------------------+  PFV      Full                                                             +---------+---------------+---------+-----------+----------+-------------------+ POP      Full           Yes      Yes                                      +---------+---------------+---------+-----------+----------+-------------------+ PTV      Full                                                             +---------+---------------+---------+-----------+----------+-------------------+ PERO                                                  Not well visualized +---------+---------------+---------+-----------+----------+-------------------+    Summary: RIGHT: - Findings consistent with age indeterminate deep vein thrombosis involving the right common femoral vein, and SF junction.  LEFT: - There is no evidence of deep vein thrombosis in the lower extremity. However, portions of this examination were limited- see technologist comments above.  *See table(s) above for measurements and observations. Electronically signed by Servando Snare MD on 12/02/2020 at 9:00:54 AM.    Final    CT Angio Abd/Pel w/ and/or w/o  Result Date: 11/30/2020 CLINICAL DATA:  Concern for intraparenchymal hemorrhage with subcapsular extension preceding noncontrast CT following ultrasound-guided liver lesion biopsy. EXAM: CTA ABDOMEN AND PELVIS WITHOUT AND WITH CONTRAST TECHNIQUE: Multidetector CT imaging of the abdomen and pelvis was performed using the standard protocol  during bolus administration of intravenous contrast. Multiplanar reconstructed images and MIPs were obtained and reviewed to evaluate the vascular anatomy. CONTRAST:  139mL OMNIPAQUE IOHEXOL 350 MG/ML SOLN COMPARISON:  Noncontrast CT scan of the abdomen and pelvis-earlier same day; CT abdomen pelvis-10/25/2020; 03/29/2019; chest CT-06/23/2017 FINDINGS: VASCULAR Aorta: Moderate to large amount of eccentric calcified atherosclerotic plaque throughout the normal caliber abdominal aorta. Calcified atherosclerotic plaque at the origin of the takeoff of the accessory left renal artery results in approximately 50% luminal narrowing (image 63, series 5). No evidence of abdominal aortic dissection or periaortic stranding. Celiac: Redemonstrated separate origins of the common hepatic and splenic arteries in lieu of a conventional celiac trunk. There is a minimal amount of eccentric noncalcified atherosclerotic plaque involving the origin of the takeoff of the common hepatic artery, not resulting in hemodynamically significant stenosis. The splenic artery is widely patent without hemodynamically significant stenosis. SMA: There is a minimal amount of eccentric calcified atherosclerotic plaque involving the origin and main trunk of the SMA, not resulting in hemodynamically significant stenosis. Conventional branching pattern. The distal tributaries the SMA appear widely patent without discrete lumen filling defect. Renals: Note is made of 3 separate left-sided renal arteries there is a moderate amount of eccentric predominantly calcified atherosclerotic plaque involving the origin of  the dominant left renal artery not definitely resulting in hemodynamically significant stenosis. The solitary right renal artery is widely patent without hemodynamically significant narrowing. No vessel irregularity to suggest FMD. IMA: Disease at its origin though remains patent. 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 The bilateral common iliac arteries are disease though without a definitive hemodynamically significant stenosis. The bilateral internal iliac arteries are disease though patent and of normal caliber. Bilateral external iliac arteries are mildly disease though patent and of caliber. Proximal Outflow: There is a minimal amount of calcified atherosclerotic plaque involving the bilateral common femoral arteries, not resulting in hemodynamically significant stenosis. The imaged portions of the bilateral deep and superficial femoral arteries are of caliber and widely patent without hemodynamically significant narrowing. Veins: The IVC appears widely patent as does the portal venous system. Review of the MIP images confirms the above findings. _________________________________________________________ NON-VASCULAR Lower chest: Limited visualization of lower thorax demonstrates minimal dependent subpleural ground-glass atelectasis. The approximately 0.7 cm subpleural nodule within the right middle lobe (image 22, series 9), is unchanged since at least the 06/2017 examination and thus a benign etiology. No pleural effusion or pneumothorax. Normal heart size. No pericardial effusion. Hepatobiliary: Normal hepatic contour. Redemonstrated mixed attenuating mass within the central aspect of the liver measuring approximately 3.9 x 3.3 cm (image 19, series 6). Redemonstrated approximately 1.3 x 1.0 cm hypoattenuating lesion within the left lobe of the liver with adjacent Gel-Foam embolization material (representative images 18 and 19, series 6). Interval development of an adjacent intraparenchymal hematomas with medial component measuring approximately 2.2 x 1.3 cm (image 22, series 6) and dominant component within the more lateral aspect of the lateral segment of left lobe of the liver measuring approximately 2.8 x 2.1 cm (image 17, series 6) and appears  to extend to the liver capsule (image 19, series 6) with associated adjacent subcapsular hematoma. This finding is associated with minimal pooling of contrast within the central aspect of the hematoma (image 17, series 6). No definitive intra part parenchymal extension however note is made of a small amount of perisplenic fluid. Pancreas: Normal appearance of the pancreas. Spleen: Note is made of a small amount of perisplenic fluid. Adrenals/Urinary Tract: There is symmetric enhancement of the bilateral kidneys. No evidence of nephrolithiasis on this postcontrast examination. No discrete renal lesions. There is a minimal amount of likely age and body habitus related bilateral perinephric stranding. No urine obstruction. Normal appearance of the bilateral adrenal glands. The urinary bladder is underdistended. Stomach/Bowel: Colonic diverticulosis without evidence of superimposed acute diverticulitis. Moderate colonic stool burden. Sequela right hemicolectomy without evidence of enteric obstruction. Small hiatal hernia. No pneumoperitoneum, pneumatosis or portal venous gas. Lymphatic: No bulky retroperitoneal, mesenteric, pelvic or inguinal lymphadenopathy. Reproductive: Post hysterectomy. No discrete adnexal lesions. Trace amount of fluid within the pelvic cul-de-sac. Other: Nodular thickening involving the subcutaneous tissues of the undersurface of the pannus, likely at the location of subcutaneous medication administration. Minimal amount of subcutaneous edema about midline of the low back. Musculoskeletal: No acute or aggressive osseous abnormalities. Stigmata of dish within the thoracic spine. Posteriorly directed disc osteophyte complex at T12-L1. IMPRESSION: Vascular Impression: 1. Post ultrasound-guided left sided liver lesion biopsy complicated by development of intraparenchymal hematomas. There is pooling of contrast within the dominant more laterally located hematoma with apparent extension of the hematoma  to the liver capsule edge with associated adjacent subcapsular hemorrhage. 2. Small amount of  perisplenic fluid. Otherwise, no definitive intraparenchymal extension. 3. Large amount of atherosclerotic plaque including a suspected hemodynamically significant stenosis within the infrarenal abdominal aorta. Aortic Atherosclerosis (ICD10-I70.0). 4. 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 5. Non conventional arterial anatomy including separate origins of the common hepatic and splenic arteries in lieu of a conventional celiac trunk as detailed above. Nonvascular Impression: 1. Image grossly unchanged appearance of known dominant infiltrative mass within the central aspect of the liver, compatible with biopsy-proven hepatocellular carcinoma. 2. Sequela of right hemicolectomy without evidence of enteric obstruction. 3. Colonic diverticulosis without evidence of superimposed acute diverticulitis. 4. Small hiatal hernia. PLAN: Given above findings the decision was made to admit the patient for observation as she fortunately remained hemodynamically stable with subjective improvement in her transiently worsening abdominal pain. We will monitor the patient's H and H and keep the patient NPO in case arteriogram and embolization is ultimately required. Electronically Signed   By: Sandi Mariscal M.D.   On: 11/30/2020 09:47

## 2020-12-03 LAB — CBC WITH DIFFERENTIAL/PLATELET
Abs Immature Granulocytes: 0.03 10*3/uL (ref 0.00–0.07)
Abs Immature Granulocytes: 0.03 10*3/uL (ref 0.00–0.07)
Abs Immature Granulocytes: 0.04 10*3/uL (ref 0.00–0.07)
Basophils Absolute: 0 10*3/uL (ref 0.0–0.1)
Basophils Absolute: 0 10*3/uL (ref 0.0–0.1)
Basophils Absolute: 0 10*3/uL (ref 0.0–0.1)
Basophils Relative: 0 %
Basophils Relative: 0 %
Basophils Relative: 0 %
Eosinophils Absolute: 0.2 10*3/uL (ref 0.0–0.5)
Eosinophils Absolute: 0.2 10*3/uL (ref 0.0–0.5)
Eosinophils Absolute: 0.1 10*3/uL (ref 0.0–0.5)
Eosinophils Relative: 2 %
Eosinophils Relative: 2 %
Eosinophils Relative: 1 %
HCT: 25.6 % — ABNORMAL LOW (ref 36.0–46.0)
HCT: 25.5 % — ABNORMAL LOW (ref 36.0–46.0)
HCT: 28.9 % — ABNORMAL LOW (ref 36.0–46.0)
Hemoglobin: 8.1 g/dL — ABNORMAL LOW (ref 12.0–15.0)
Hemoglobin: 8.3 g/dL — ABNORMAL LOW (ref 12.0–15.0)
Hemoglobin: 9.3 g/dL — ABNORMAL LOW (ref 12.0–15.0)
Immature Granulocytes: 0 %
Immature Granulocytes: 0 %
Immature Granulocytes: 1 %
Lymphocytes Relative: 18 %
Lymphocytes Relative: 21 %
Lymphocytes Relative: 13 %
Lymphs Abs: 1.5 10*3/uL (ref 0.7–4.0)
Lymphs Abs: 1.7 10*3/uL (ref 0.7–4.0)
Lymphs Abs: 1 10*3/uL (ref 0.7–4.0)
MCH: 30.9 pg (ref 26.0–34.0)
MCH: 31.4 pg (ref 26.0–34.0)
MCH: 31.5 pg (ref 26.0–34.0)
MCHC: 31.6 g/dL (ref 30.0–36.0)
MCHC: 32.5 g/dL (ref 30.0–36.0)
MCHC: 32.2 g/dL (ref 30.0–36.0)
MCV: 97.7 fL (ref 80.0–100.0)
MCV: 96.6 fL (ref 80.0–100.0)
MCV: 98 fL (ref 80.0–100.0)
Monocytes Absolute: 0.9 10*3/uL (ref 0.1–1.0)
Monocytes Absolute: 0.9 10*3/uL (ref 0.1–1.0)
Monocytes Absolute: 0.7 10*3/uL (ref 0.1–1.0)
Monocytes Relative: 11 %
Monocytes Relative: 10 %
Monocytes Relative: 9 %
Neutro Abs: 5.4 10*3/uL (ref 1.7–7.7)
Neutro Abs: 5.5 10*3/uL (ref 1.7–7.7)
Neutro Abs: 5.7 10*3/uL (ref 1.7–7.7)
Neutrophils Relative %: 69 %
Neutrophils Relative %: 67 %
Neutrophils Relative %: 76 %
Platelets: 136 10*3/uL — ABNORMAL LOW (ref 150–400)
Platelets: 144 10*3/uL — ABNORMAL LOW (ref 150–400)
Platelets: 190 10*3/uL (ref 150–400)
RBC: 2.62 MIL/uL — ABNORMAL LOW (ref 3.87–5.11)
RBC: 2.64 MIL/uL — ABNORMAL LOW (ref 3.87–5.11)
RBC: 2.95 MIL/uL — ABNORMAL LOW (ref 3.87–5.11)
RDW: 13.8 % (ref 11.5–15.5)
RDW: 13.5 % (ref 11.5–15.5)
RDW: 13.7 % (ref 11.5–15.5)
WBC: 7.9 10*3/uL (ref 4.0–10.5)
WBC: 8.3 10*3/uL (ref 4.0–10.5)
WBC: 7.6 10*3/uL (ref 4.0–10.5)
nRBC: 0 % (ref 0.0–0.2)
nRBC: 0 % (ref 0.0–0.2)
nRBC: 0 % (ref 0.0–0.2)

## 2020-12-03 LAB — COMPREHENSIVE METABOLIC PANEL
ALT: 39 U/L (ref 0–44)
AST: 27 U/L (ref 15–41)
Albumin: 2.5 g/dL — ABNORMAL LOW (ref 3.5–5.0)
Alkaline Phosphatase: 33 U/L — ABNORMAL LOW (ref 38–126)
Anion gap: 8 (ref 5–15)
BUN: 17 mg/dL (ref 8–23)
CO2: 23 mmol/L (ref 22–32)
Calcium: 8.5 mg/dL — ABNORMAL LOW (ref 8.9–10.3)
Chloride: 107 mmol/L (ref 98–111)
Creatinine, Ser: 1.3 mg/dL — ABNORMAL HIGH (ref 0.44–1.00)
GFR, Estimated: 43 mL/min — ABNORMAL LOW (ref >60)
Glucose, Bld: 225 mg/dL — ABNORMAL HIGH (ref 70–99)
Potassium: 4.3 mmol/L (ref 3.5–5.1)
Sodium: 138 mmol/L (ref 135–145)
Total Bilirubin: 0.7 mg/dL (ref 0.3–1.2)
Total Protein: 5.1 g/dL — ABNORMAL LOW (ref 6.5–8.1)

## 2020-12-03 LAB — GLUCOSE, CAPILLARY
Glucose-Capillary: 250 mg/dL — ABNORMAL HIGH (ref 70–99)
Glucose-Capillary: 175 mg/dL — ABNORMAL HIGH (ref 70–99)
Glucose-Capillary: 135 mg/dL — ABNORMAL HIGH (ref 70–99)
Glucose-Capillary: 216 mg/dL — ABNORMAL HIGH (ref 70–99)
Glucose-Capillary: 171 mg/dL — ABNORMAL HIGH (ref 70–99)
Glucose-Capillary: 200 mg/dL — ABNORMAL HIGH (ref 70–99)
Glucose-Capillary: 143 mg/dL — ABNORMAL HIGH (ref 70–99)
Glucose-Capillary: 154 mg/dL — ABNORMAL HIGH (ref 70–99)
Glucose-Capillary: 143 mg/dL — ABNORMAL HIGH (ref 70–99)
Glucose-Capillary: 172 mg/dL — ABNORMAL HIGH (ref 70–99)
Glucose-Capillary: 250 mg/dL — ABNORMAL HIGH (ref 70–99)
Glucose-Capillary: 234 mg/dL — ABNORMAL HIGH (ref 70–99)

## 2020-12-03 LAB — BRAIN NATRIURETIC PEPTIDE: B Natriuretic Peptide: 78.9 pg/mL (ref 0.0–100.0)

## 2020-12-03 LAB — SURGICAL PATHOLOGY

## 2020-12-03 LAB — MAGNESIUM: Magnesium: 1.6 mg/dL — ABNORMAL LOW (ref 1.7–2.4)

## 2020-12-03 MED ORDER — LACTATED RINGERS IV SOLN: INTRAVENOUS | Status: AC

## 2020-12-03 MED ORDER — CARVEDILOL 3.125 MG PO TABS
3.1250 mg | ORAL_TABLET | Freq: Two times a day (BID) | ORAL | Status: DC
  Administered 2020-12-04: 3.125 mg via ORAL
  Filled 2020-12-03: qty 1

## 2020-12-03 MED ORDER — MAGNESIUM SULFATE 2 GM/50ML IV SOLN
2.0000 g | Freq: Once | INTRAVENOUS | Status: AC
  Administered 2020-12-03: 2 g via INTRAVENOUS
  Filled 2020-12-03: qty 50

## 2020-12-03 MED ORDER — LACTATED RINGERS IV SOLN: INTRAVENOUS | Status: DC

## 2020-12-03 MED ORDER — CARVEDILOL 3.125 MG PO TABS: 3.1250 mg | ORAL_TABLET | Freq: Two times a day (BID) | ORAL | Status: DC

## 2020-12-03 NOTE — Progress Notes (Signed)
Referring Physician(s): Firefighter  Supervising Physician: Mir, Biochemist, clinical  Patient Status:  [facility] - In-pt  Chief Complaint: Abdominal pain/bleed post liver mass biopsy 4/14, hepatocellular carcinoma   Subjective: Patient feels good this am. No abd pain and LE pain Xarelto was restarted yesterday   Allergies: Adhesive [tape], Neosporin original [bacitracin-neomycin-polymyxin], Sulfa antibiotics, and Erythromycin  Medications:  Current Facility-Administered Medications:  .  acetaminophen (TYLENOL) tablet 500 mg, 500 mg, Oral, Q6H PRN, Criselda Peaches, MD .  carvedilol (COREG) tablet 6.25 mg, 6.25 mg, Oral, BID WC, Li, Na, MD, 6.25 mg at 12/02/20 0857 .  docusate sodium (COLACE) capsule 100 mg, 100 mg, Oral, BID, Criselda Peaches, MD .  folic acid (FOLVITE) tablet 1 mg, 1 mg, Oral, Daily, Criselda Peaches, MD, 1 mg at 12/02/20 0856 .  HYDROcodone-acetaminophen (NORCO/VICODIN) 5-325 MG per tablet 1-2 tablet, 1-2 tablet, Oral, Q4H PRN, Criselda Peaches, MD, 2 tablet at 11/29/20 2213 .  insulin aspart (novoLOG) injection 0-15 Units, 0-15 Units, Subcutaneous, TID WC, Criselda Peaches, MD, 3 Units at 12/02/20 1735 .  insulin aspart (novoLOG) injection 0-5 Units, 0-5 Units, Subcutaneous, QHS, McCullough, Heath K, MD .  insulin aspart (novoLOG) injection 4 Units, 4 Units, Subcutaneous, TID WC, Criselda Peaches, MD, 4 Units at 12/02/20 1736 .  loperamide (IMODIUM) capsule 2 mg, 2 mg, Oral, Daily PRN, Nicoletta Dress, Na, MD, 2 mg at 12/02/20 1532 .  methotrexate (RHEUMATREX) tablet 20 mg, 20 mg, Oral, Q Sun, McCullough, Heath K, MD, 20 mg at 12/02/20 0857 .  ondansetron (ZOFRAN) injection 4 mg, 4 mg, Intravenous, Q6H PRN, Criselda Peaches, MD .  pantoprazole (PROTONIX) EC tablet 40 mg, 40 mg, Oral, Daily, Criselda Peaches, MD, 40 mg at 12/02/20 0857 .  rivaroxaban (XARELTO) tablet 10 mg, 10 mg, Oral, Q supper, Allred, Darrell K, PA-C, 10 mg at 12/02/20 1719 .   rOPINIRole (REQUIP) tablet 1 mg, 1 mg, Oral, Everlean Alstrom, MD, 1 mg at 12/03/20 [phone] .  sodium chloride (OCEAN) 0.65 % nasal spray 1 spray, 1 spray, Nasal, Daily PRN, Criselda Peaches, MD  Facility-Administered Medications Ordered in Other Encounters:  .  0.9 %  sodium chloride infusion, , Intravenous, Continuous, Holley Bouche, NP, Stopped at 09/14/17 1440    Vital Signs: BP (!) 122/46 (BP Location: Left Arm)   Pulse 77   Temp 97.6 F (36.4 C) (Oral)   Resp 18   Ht 5\' 3"  (1.6 m)   Wt 108 kg   SpO2 98%   BMI 42.18 kg/m   Physical Exam awake, alert. Abdomen soft, positive bowel sounds, not significantly tender, puncture site epigastric region without ecchymosis;  Trace pretibial edema on the right, no significant left lower extremity edema.  Imaging: CT ABDOMEN PELVIS WO CONTRAST  Result Date: 11/30/2020 CLINICAL DATA:  Known history of colorectal cancer and hepatocellular carcinoma, now with acute onset of abdominal pain following liver lesion biopsy. EXAM: CT ABDOMEN AND PELVIS WITHOUT CONTRAST TECHNIQUE: Multidetector CT imaging of the abdomen and pelvis was performed following the standard protocol without IV contrast. COMPARISON:  CT abdomen and pelvis-10/25/2020; 09/16/2018 FINDINGS: The lack of intravenous contrast limits the ability to evaluate solid abdominal organs. Lower chest: Limited visualization of the lower thorax demonstrates minimal dependent subpleural ground-glass atelectasis. Unchanged punctate (0.6 cm) sub pleural nodule within the right middle lobe is unchanged since at least the 08/2018 examination and thus of benign etiology. No discrete focal airspace opacities. No pleural effusion. Normal heart  size. No pericardial effusion. Calcifications of the mitral valve annulus. Hepatobiliary: Normal hepatic contour. Known infiltrative mass within the central aspect of the liver appears grossly unchanged. There is a minimal amount intraparenchymal air  along the tract from the biopsy adjacent to the ill-defined mixed attenuating lesion within the left lobe of the liver (image 19 and 20, series 3). Interval development of an adjacent area of hypo attenuation within the subcapsular aspect of the lateral segment of the left lobe of the liver (image 17, series 3 with associated adjacent subcapsular hypoattenuating fluid which demonstrates Hounsfield units suggestive of hemorrhage (image 18, series 3. No definitive intraparenchymal extension of suspected subcapsular hemorrhage though note is made of a small amount perisplenic fluid. Post cholecystectomy. Pancreas: Normal noncontrast appearance of the pancreas. Spleen: Note is made of a small amount of perisplenic fluid which demonstrates Hounsfield units of 22 (image 20, series 3) Adrenals/Urinary Tract: Mild atrophy of the bilateral kidneys. No renal stones. There is a minimal amount of likely age and body habitus related perinephric stranding. No urine obstruction Normal noncontrast appearance the bilateral adrenal glands. Normal appearance of the urinary bladder given underdistention. Stomach/Bowel: Moderate to large colonic stool burden without evidence of enteric obstruction. Scattered colonic diverticulosis without evidence of superimposed acute diverticulitis. Stable sequela of right hemicolectomy without evidence of enteric obstruction. No discrete areas of bowel wall thickening. Small hiatal hernia. No pneumoperitoneum, pneumatosis or portal venous gas. Vascular/Lymphatic: Extensive atherosclerotic plaque within normal caliber abdominal aorta. No bulky retroperitoneal, mesenteric, pelvic or inguinal lymphadenopathy on this noncontrast examination. Reproductive: Post hysterectomy. No discrete adnexal lesions. There is a trace amount of fluid in the pelvic cul-de-sac. Other: Somewhat nodular foci of subcutaneous stranding within the abdominal pannus likely represents the location of subcutaneous medication  administration. Mild diffuse body wall anasarca. Musculoskeletal: No definite acute or aggressive osseous abnormalities. Moderate multilevel lumbar spine DDD is suspected though incompletely evaluated. IMPRESSION: 1. Suspected development of intra parenchymal hematoma with associated subcapsular extension following ultrasound-guided liver lesion biopsy. No definite intra parenchymal extension however note is made of a small amount of perisplenic fluid. 2. Similar appearance of known infiltrative mass with the central aspects of the liver. 3. Stable sequela of right hemicolectomy without evidence of enteric obstruction. 4. Aortic Atherosclerosis (ICD10-I70.0). PLAN: Given above, patient subsequently underwent multiphase contrast-enhanced CT. Electronically Signed   By: Sandi Mariscal M.D.   On: 11/30/2020 09:11   US BIOPSY (LIVER)  Result Date: 11/29/2020 INDICATION: History of hepatocellular carcinoma as well as colon cancer, now with indeterminate liver lesions. Please perform ultrasound-guided liver lesion biopsy for tissue diagnostic purposes. EXAM: ULTRASOUND GUIDED LIVER LESION BIOPSY COMPARISON:  CT abdomen pelvis-10/26/2020; abdominal MRI-09/21/2020 MEDICATIONS: None ANESTHESIA/SEDATION: Fentanyl 50 mcg IV; Versed 1 mg IV Total Moderate Sedation time:  15 Minutes. The patient's level of consciousness and vital signs were monitored continuously by radiology nursing throughout the procedure under my direct supervision. COMPLICATIONS: None immediate. PROCEDURE: Informed written consent was obtained from the patient after a discussion of the risks, benefits and alternatives to treatment. The patient understands and consents the procedure. A timeout was performed prior to the initiation of the procedure. Ultrasound scanning was performed of the right upper abdominal quadrant demonstrates an approximately 1.5 x 1.4 cm hypoechoic lesion within the left lobe of the liver correlating with the hypervascular lesion  seen on preceding abdominal CT image 28, series 3. The procedure was planned. The midline of the upper abdomen was prepped and draped in the usual sterile fashion. The  overlying soft tissues were anesthetized with 1% lidocaine with epinephrine. A 17 gauge, 6.8 cm co-axial needle was advanced into a peripheral aspect of the lesion. This was followed by 4 core biopsies with an 18 gauge core device under direct ultrasound guidance. The coaxial needle tract was embolized with a small amount of Gel-Foam slurry and superficial hemostasis was obtained with manual compression. Post procedural scanning was negative for definitive area of hemorrhage or additional complication. A dressing was placed. The patient tolerated the procedure well without immediate post procedural complication. IMPRESSION: Technically successful ultrasound guided core needle biopsy of indeterminate lesion within the left lobe of the liver. Electronically Signed   By: Sandi Mariscal M.D.   On: 11/29/2020 14:26   VAS [place] LOWER EXTREMITY VENOUS (DVT)  Result Date: 12/02/2020  Lower Venous DVT Study Indications: Pulmonary embolism 10/25/20.  Risk Factors: Cancer newly diagnosed hepatocellular carcinoma 11/29/20. Limitations: Body habitus. Comparison Study: No prior study Performing Technologist: Sharion Dove RVS  Examination Guidelines: A complete evaluation includes B-mode imaging, spectral Doppler, color Doppler, and power Doppler as needed of all accessible portions of each vessel. Bilateral testing is considered an integral part of a complete examination. Limited examinations for reoccurring indications may be performed as noted. The reflux portion of the exam is performed with the patient in reverse Trendelenburg.  +---------+---------------+---------+-----------+----------+-------------------+ RIGHT    CompressibilityPhasicitySpontaneityPropertiesThrombus Aging       +---------+---------------+---------+-----------+----------+-------------------+ CFV      Partial        No       No                   Age Indeterminate   +---------+---------------+---------+-----------+----------+-------------------+ SFJ      Partial                                      Age Indeterminate   +---------+---------------+---------+-----------+----------+-------------------+ FV Prox  Full                                                             +---------+---------------+---------+-----------+----------+-------------------+ FV Mid   Full                                                             +---------+---------------+---------+-----------+----------+-------------------+ FV DistalFull                                                             +---------+---------------+---------+-----------+----------+-------------------+ PFV      Full                                                             +---------+---------------+---------+-----------+----------+-------------------+ POP  Full           Yes      Yes                                      +---------+---------------+---------+-----------+----------+-------------------+ PTV      Full                                                             +---------+---------------+---------+-----------+----------+-------------------+ PERO                                                  Not well visualized +---------+---------------+---------+-----------+----------+-------------------+   +---------+---------------+---------+-----------+----------+-------------------+ LEFT     CompressibilityPhasicitySpontaneityPropertiesThrombus Aging      +---------+---------------+---------+-----------+----------+-------------------+ CFV      Full           Yes      Yes                                      +---------+---------------+---------+-----------+----------+-------------------+  SFJ      Full                                                             +---------+---------------+---------+-----------+----------+-------------------+ FV Prox  Full                                                             +---------+---------------+---------+-----------+----------+-------------------+ FV Mid   Full                                                             +---------+---------------+---------+-----------+----------+-------------------+ FV DistalFull                                                             +---------+---------------+---------+-----------+----------+-------------------+ PFV      Full                                                             +---------+---------------+---------+-----------+----------+-------------------+ POP      Full  Yes      Yes                                      +---------+---------------+---------+-----------+----------+-------------------+ PTV      Full                                                             +---------+---------------+---------+-----------+----------+-------------------+ PERO                                                  Not well visualized +---------+---------------+---------+-----------+----------+-------------------+    Summary: RIGHT: - Findings consistent with age indeterminate deep vein thrombosis involving the right common femoral vein, and SF junction.  LEFT: - There is no evidence of deep vein thrombosis in the lower extremity. However, portions of this examination were limited- see technologist comments above.  *See table(s) above for measurements and observations. Electronically signed by Servando Snare MD on 12/02/2020 at 9:00:54 AM.    Final    CT Angio Abd/Pel w/ and/or w/o  Result Date: 11/30/2020 CLINICAL DATA:  Concern for intraparenchymal hemorrhage with subcapsular extension preceding noncontrast CT following ultrasound-guided liver lesion  biopsy. EXAM: CTA ABDOMEN AND PELVIS WITHOUT AND WITH CONTRAST TECHNIQUE: Multidetector CT imaging of the abdomen and pelvis was performed using the standard protocol during bolus administration of intravenous contrast. Multiplanar reconstructed images and MIPs were obtained and reviewed to evaluate the vascular anatomy. CONTRAST:  13mL OMNIPAQUE IOHEXOL 350 MG/ML SOLN COMPARISON:  Noncontrast CT scan of the abdomen and pelvis-earlier same day; CT abdomen pelvis-10/25/2020; 03/29/2019; chest CT-06/23/2017 FINDINGS: VASCULAR Aorta: Moderate to large amount of eccentric calcified atherosclerotic plaque throughout the normal caliber abdominal aorta. Calcified atherosclerotic plaque at the origin of the takeoff of the accessory left renal artery results in approximately 50% luminal narrowing (image 63, series 5). No evidence of abdominal aortic dissection or periaortic stranding. Celiac: Redemonstrated separate origins of the common hepatic and splenic arteries in lieu of a conventional celiac trunk. There is a minimal amount of eccentric noncalcified atherosclerotic plaque involving the origin of the takeoff of the common hepatic artery, not resulting in hemodynamically significant stenosis. The splenic artery is widely patent without hemodynamically significant stenosis. SMA: There is a minimal amount of eccentric calcified atherosclerotic plaque involving the origin and main trunk of the SMA, not resulting in hemodynamically significant stenosis. Conventional branching pattern. The distal tributaries the SMA appear widely patent without discrete lumen filling defect. Renals: Note is made of 3 separate left-sided renal arteries there is a moderate amount of eccentric predominantly calcified atherosclerotic plaque involving the origin of the dominant left renal artery not definitely resulting in hemodynamically significant stenosis. The solitary right renal artery is widely patent without hemodynamically significant  narrowing. No vessel irregularity to suggest FMD. IMA: Disease at its origin though remains patent. 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 The bilateral common iliac arteries are disease though without a definitive hemodynamically significant stenosis. The bilateral internal iliac arteries are disease though patent and of  normal caliber. Bilateral external iliac arteries are mildly disease though patent and of caliber. Proximal Outflow: There is a minimal amount of calcified atherosclerotic plaque involving the bilateral common femoral arteries, not resulting in hemodynamically significant stenosis. The imaged portions of the bilateral deep and superficial femoral arteries are of caliber and widely patent without hemodynamically significant narrowing. Veins: The IVC appears widely patent as does the portal venous system. Review of the MIP images confirms the above findings. _________________________________________________________ NON-VASCULAR Lower chest: Limited visualization of lower thorax demonstrates minimal dependent subpleural ground-glass atelectasis. The approximately 0.7 cm subpleural nodule within the right middle lobe (image 22, series 9), is unchanged since at least the 06/2017 examination and thus a benign etiology. No pleural effusion or pneumothorax. Normal heart size. No pericardial effusion. Hepatobiliary: Normal hepatic contour. Redemonstrated mixed attenuating mass within the central aspect of the liver measuring approximately 3.9 x 3.3 cm (image 19, series 6). Redemonstrated approximately 1.3 x 1.0 cm hypoattenuating lesion within the left lobe of the liver with adjacent Gel-Foam embolization material (representative images 18 and 19, series 6). Interval development of an adjacent intraparenchymal hematomas with medial component measuring approximately 2.2 x 1.3 cm (image 22, series 6) and  dominant component within the more lateral aspect of the lateral segment of left lobe of the liver measuring approximately 2.8 x 2.1 cm (image 17, series 6) and appears to extend to the liver capsule (image 19, series 6) with associated adjacent subcapsular hematoma. This finding is associated with minimal pooling of contrast within the central aspect of the hematoma (image 17, series 6). No definitive intra part parenchymal extension however note is made of a small amount of perisplenic fluid. Pancreas: Normal appearance of the pancreas. Spleen: Note is made of a small amount of perisplenic fluid. Adrenals/Urinary Tract: There is symmetric enhancement of the bilateral kidneys. No evidence of nephrolithiasis on this postcontrast examination. No discrete renal lesions. There is a minimal amount of likely age and body habitus related bilateral perinephric stranding. No urine obstruction. Normal appearance of the bilateral adrenal glands. The urinary bladder is underdistended. Stomach/Bowel: Colonic diverticulosis without evidence of superimposed acute diverticulitis. Moderate colonic stool burden. Sequela right hemicolectomy without evidence of enteric obstruction. Small hiatal hernia. No pneumoperitoneum, pneumatosis or portal venous gas. Lymphatic: No bulky retroperitoneal, mesenteric, pelvic or inguinal lymphadenopathy. Reproductive: Post hysterectomy. No discrete adnexal lesions. Trace amount of fluid within the pelvic cul-de-sac. Other: Nodular thickening involving the subcutaneous tissues of the undersurface of the pannus, likely at the location of subcutaneous medication administration. Minimal amount of subcutaneous edema about midline of the low back. Musculoskeletal: No acute or aggressive osseous abnormalities. Stigmata of dish within the thoracic spine. Posteriorly directed disc osteophyte complex at T12-L1. IMPRESSION: Vascular Impression: 1. Post ultrasound-guided left sided liver lesion biopsy  complicated by development of intraparenchymal hematomas. There is pooling of contrast within the dominant more laterally located hematoma with apparent extension of the hematoma to the liver capsule edge with associated adjacent subcapsular hemorrhage. 2. Small amount of perisplenic fluid. Otherwise, no definitive intraparenchymal extension. 3. Large amount of atherosclerotic plaque including a suspected hemodynamically significant stenosis within the infrarenal abdominal aorta. Aortic Atherosclerosis (ICD10-I70.0). 4. 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 5. Non conventional arterial anatomy including separate origins of the common hepatic and splenic arteries in lieu of a conventional celiac trunk as detailed above. Nonvascular Impression: 1. Image grossly unchanged appearance of known dominant infiltrative mass within the  central aspect of the liver, compatible with biopsy-proven hepatocellular carcinoma. 2. Sequela of right hemicolectomy without evidence of enteric obstruction. 3. Colonic diverticulosis without evidence of superimposed acute diverticulitis. 4. Small hiatal hernia. PLAN: Given above findings the decision was made to admit the patient for observation as she fortunately remained hemodynamically stable with subjective improvement in her transiently worsening abdominal pain. We will monitor the patient's H and H and keep the patient NPO in case arteriogram and embolization is ultimately required. Electronically Signed   By: Sandi Mariscal M.D.   On: 11/30/2020 09:47    Labs:  CBC: Recent Labs    12/02/20 1000 12/02/20 2130 12/03/20 0132 12/03/20 0823  WBC 7.7 8.0 7.9 8.3  HGB 9.0* 9.4* 8.1* 8.3*  HCT 28.2* 28.7* 25.6* 25.5*  PLT 136* 137* 136* 144*    COAGS: Recent Labs    11/29/20 1129 12/01/20 0929  INR 1.0 1.1    BMP: Recent Labs    12/26/19 1008 04/02/20 0909 09/19/20 1032 11/21/20 1249  12/01/20 0050 12/02/20 0216 12/03/20 0132  NA 139 139   < > 140 138 138 138  K 3.8 4.8   < > 5.0 4.5 4.2 4.3  CL 102 104   < > 104 108 108 107  CO2 29 26   < > 29 26 25 23   GLUCOSE 113* 198*   < > 92 143* 175* 225*  BUN 23 20   < > 19 22 19 17   CALCIUM 9.0 9.5   < > 9.3 8.2* 8.1* 8.5*  CREATININE 1.00 1.14*   < > 1.12* 1.17* 1.06* 1.30*  GFRNONAA 55* 47*   < > 51* 48* 54* 43*  GFRAA >60 54*  --   --   --   --   --    < > = values in this interval not displayed.    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 Dwaine Gale; no current indication for IVC filter at this time Xarelto was restarted yesterday. Hgb stable/improved at 8.3 this am. So far no evidence of recurrent/ongoing bleeding Will d/w Dr. Serafina Royals    Electronically Signed: Ascencion Dike, PA-C 12/03/2020, 9:28 AM   I spent a total of 20 minutes at the the patient's bedside AND on the patient's hospital floor or unit, greater than 50% of which was counseling/coordinating care for status post liver mass biopsy with subsequent bleed/hematomas

## 2020-12-03 NOTE — Evaluation (Signed)
Occupational Therapy Evaluation Patient Details Name: Wanda Curry MRN: [id number] DOB: 08/29/1945 Today's Date: 12/03/2020    History of Present Illness Wanda Curry is a 75 y/o female who was admitted on 11/29/20 following a scheduled liver lesion biopsy. CT negative for acute findings. Negative for DVT on L. Age indeterminate DVT in R common femoral vein. PMH includes RA, fibromyalgia, GERD, DM, HLD, hx of unprovoked PE, colon cancer s/phemicolectomy, and hepatocellular carcinoma.   Clinical Impression   Pt presents with decline in function and safety with ADLs and ADL mobility with impaired balance and endurance. Pt very pleasant and cooperative. PTA pt lived at home alone and was Ind with ADLs. Mobility, home mgt and was driving. Pt currently requires min guard A for LB selfcare and ADL mobility. Pt would benefit from acute OT services to address impairments to maximize level of function and safety    Follow Up Recommendations  Supervision - Intermittent;No OT follow up    Equipment Recommendations  None recommended by OT    Recommendations for Other Services       Precautions / Restrictions Precautions Precautions: Fall Restrictions Weight Bearing Restrictions: No      Mobility Bed Mobility               General bed mobility comments: pt in recliner upon arrival    Transfers Overall transfer level: Needs assistance Equipment used: None Transfers: Sit to/from Stand Sit to Stand: Min guard              Balance Overall balance assessment: Needs assistance   Sitting balance-Leahy Scale: Good     Standing balance support: Single extremity supported;No upper extremity supported;During functional activity Standing balance-Leahy Scale: Fair                             ADL either performed or assessed with clinical judgement   ADL Overall ADL's : Needs assistance/impaired Eating/Feeding: Independent;Sitting   Grooming: Wash/dry hands;Wash/dry  face;Min guard;Standing   Upper Body Bathing: Set up;Sitting   Lower Body Bathing: Min guard   Upper Body Dressing : Set up;Sitting   Lower Body Dressing: Min guard   Toilet Transfer: Min guard;Ambulation;Stand-pivot   Toileting- Water quality scientist and Hygiene: Min guard;Sit to/from stand       Functional mobility during ADLs: Min guard       Vision Baseline Vision/History: Wears glasses Wears Glasses: Reading only Patient Visual Report: No change from baseline       Perception     Praxis      Pertinent Vitals/Pain Pain Assessment: No/denies pain     Hand Dominance Right   Extremity/Trunk Assessment Upper Extremity Assessment Upper Extremity Assessment: Overall WFL for tasks assessed   Lower Extremity Assessment Lower Extremity Assessment: Defer to PT evaluation   Cervical / Trunk Assessment Cervical / Trunk Assessment: Kyphotic   Communication Communication Communication: No difficulties   Cognition Arousal/Alertness: Awake/alert Behavior During Therapy: WFL for tasks assessed/performed Overall Cognitive Status: Within Functional Limits for tasks assessed                                     General Comments       Exercises     Shoulder Instructions      Home Living Family/patient expects to be discharged to:: Private residence Living Arrangements: Alone Available Help at Discharge: Family;Available PRN/intermittently Type of Home:  House Home Access: Stairs to enter [employer] of Steps: 2 Entrance Stairs-Rails: Can reach both;Left;Right Home Layout: One level     Bathroom Shower/Tub: Teacher, early years/pre: Handicapped height Bathroom Accessibility: Yes   Home Equipment: Clinical cytogeneticist - 2 wheels;Bedside commode;Cane - single point;Wheelchair - manual          Prior Functioning/Environment Level of Independence: Independent        Comments: Ind with ADLs/selfcare, no AD for mobility.  If leaves house, goes with family member who can provide assistance/hand. Daughter lives 15-20 min away, neighbor across the street who can provide immediate help if needed. One fall within last 6 months where pt had episode of diarrhea and then felt dizzy in kitchen and fell.        OT Problem List: Impaired balance (sitting and/or standing);Decreased activity tolerance      OT Treatment/Interventions: Self-care/ADL training;DME and/or AE instruction;Therapeutic activities;Patient/family education    OT Goals(Current goals can be found in the care plan section) Acute Rehab OT Goals Patient Stated Goal: go home, see cat OT Goal Formulation: With patient Time For Goal Achievement: 12/17/20 Potential to Achieve Goals: Good ADL Goals Pt Will Perform Grooming: with supervision;with modified independence;standing Pt Will Perform Lower Body Bathing: with supervision;with modified independence;sit to/from stand Pt Will Perform Lower Body Dressing: with supervision;with modified independence;sit to/from stand Pt Will Transfer to Toilet: with supervision;with modified independence;ambulating Pt Will Perform Toileting - Clothing Manipulation and hygiene: with supervision;with modified independence;sit to/from stand Pt Will Perform Tub/Shower Transfer: with supervision;with modified independence;ambulating;shower seat;grab bars  OT Frequency: Min 2X/week   Barriers to D/C:            Co-evaluation              AM-PAC OT "6 Clicks" Daily Activity     Outcome Measure Help from another person eating meals?: None Help from another person taking care of personal grooming?: A Little Help from another person toileting, which includes using toliet, bedpan, or urinal?: A Little Help from another person bathing (including washing, rinsing, drying)?: A Little Help from another person to put on and taking off regular upper body clothing?: None Help from another person to put on and taking off  regular lower body clothing?: A Little 6 Click Score: 20   End of Session Equipment Utilized During Treatment: Gait belt  Activity Tolerance: Patient tolerated treatment well Patient left: in chair  OT Visit Diagnosis: Unsteadiness on feet (R26.81)                Time: 1751-0258 OT Time Calculation (min): 26 min Charges:  OT General Charges $OT Visit: 1 Visit OT Treatments $Self Care/Home Management : 8-22 mins    Britt Bottom 12/03/2020, 3:55 PM

## 2020-12-03 NOTE — Progress Notes (Signed)
Consult  Progress Note                                                                                                                                                                                                          Patient Demographics:    Wanda Curry, is a 75 y.o. female, DOB - [birth year], BTD:[id number]  Outpatient Primary MD for the patient is Earney Mallet, MD    LOS - 1  Admit date - 11/29/2020    No chief complaint on file.      Brief Narrative (HPI from H&P)   Wanda Curry is a 75 y.o. female with medical history significant of PE on Xarelto, Hepatocellular carcinoma s/p bland hepatic embolization 07/24/20 in IR , Stage IIa (CT3CN0) colon cancer s/p Right hemicolectomy on 10/10/2016, T2DM, HLD, chronic diarrhea, RA on MTX who presented on 11/29/20 for liver lesion biopsy, post biopsy she developed abdominal pain and lightheadedness and was found to have post biopsy intra-abdominal bleeding small from the site of biopsy.  She was admitted by IR & TRH was requested to consult for medical management.   Subjective:   Patient in bed, appears comfortable, denies any headache, no fever, no chest pain or pressure, no shortness of breath , no abdominal pain. No new focal weakness.    Assessment  & Plan :     1.  Post liver biopsy development of intraparenchymal liver bleed causing acute blood loss anemia.  Her baseline hemoglobin seems to be around 13.5 but it has fortunately stabilized around 8.5.  No further signs of ongoing bleeding continue to monitor cautiously.  Expect mild fall with heme dilution due to IV fluids on 12/03/2020.  2.  History of DVT AND PE.  She is baseline on Xarelto, anticoagulation has been resumed by primary team we will continue to monitor.   3.  History of hepatocellular carcinoma s/p hepatic embolization in December 2021, stage IIa colon cancer status post right hemicolectomy.   Outpatient oncology and PCP follow-up.  4.  Chronic diarrhea.  Supportive care.  5.  History of RA.  On methotrexate.  Outpatient resumption of medication and monitoring of LFTs by PCP and rheumatologist.  6.  AKI on CKD stage III A baseline creatinine around 1.3.  Gentle hydration on 12/03/2020, skip her evening beta-blocker as she was slightly hypotensive and monitor.  7.  Hypertension.  Blood pressure slightly soft, will skip Coreg in the evening of 12/03/2020 and monitor.  8.  GERD.  On PPI.  9.  Restless leg syndrome.  Continue Requip.  10.  DM type II.  On sliding scale will monitor and adjust.  Lab Results  Component Value Date   HGBA1C 6.5 (H) 11/29/2020   CBG (last 3)  Recent Labs    12/02/20 1704 12/02/20 2124 12/03/20 0745  GLUCAP 189* 200* 143*          Condition - Guarded  Family Communication  : per Primary team IR  Code Status :  Full  Consults  :  TRH consulting for IR  PUD Prophylaxis :    Procedures  :     Leg US - Findings consistent with age indeterminate deep vein thrombosis involving the right common femoral vein, and SF junction      Disposition Plan  :    Status is: Observation  DVT Prophylaxis  :  Per IR primary team  Lab Results  Component Value Date   PLT 144 (L) 12/03/2020    Diet :  Diet Order            Diet regular Room service appropriate? Yes; Fluid consistency: Thin  Diet effective now                  Inpatient Medications  Scheduled Meds: . [START ON 12/04/2020] carvedilol  3.125 mg Oral BID WC  . docusate sodium  100 mg Oral BID  . folic acid  1 mg Oral Daily  . insulin aspart  0-15 Units Subcutaneous TID WC  . insulin aspart  0-5 Units Subcutaneous QHS  . insulin aspart  4 Units Subcutaneous TID WC  . methotrexate  20 mg Oral Q Sun  . pantoprazole  40 mg Oral Daily  . rivaroxaban  10 mg Oral Q supper  . rOPINIRole  1 mg Oral BH-q7a   Continuous Infusions: . lactated ringers    . magnesium sulfate  bolus IVPB     PRN Meds:.acetaminophen, HYDROcodone-acetaminophen, loperamide, ondansetron (ZOFRAN) IV, sodium chloride  Antibiotics  :    Anti-infectives (From admission, onward)   None       Time Spent in minutes  30   Lala Lund M.D on 12/03/2020 at 9:45 AM  To page go to [email or web address]   [facility] -  Office  [phone]      See all Orders from today for further details    Objective:   Vitals:   12/02/20 1548 12/02/20 1706 12/02/20 2047 12/03/20 0547  BP: (!) 123/50 (!) 106/54 (!) 128/48 (!) 122/46  Pulse:  79 81 77  Resp:  (!) 21 16 18   Temp: 97.8 F (36.6 C) 98.8 F (37.1 C) 98.9 F (37.2 C) 97.6 F (36.4 C)  TempSrc: Oral Oral Oral Oral  SpO2:  97% 96% 98%  Weight:      Height:        Wt Readings from Last 3 Encounters:  12/02/20 108 kg  11/21/20 103.3 kg  07/25/19 118.5 kg     Intake/Output Summary (Last 24 hours) at 12/03/2020 0945 Last data filed at 12/02/2020 1051 Gross per 24 hour  Intake --  Output 1200 ml  Net -1200 ml     Physical Exam  Awake Alert, No new F.N deficits, Normal affect [place].AT,PERRAL Supple Neck,No JVD, No cervical lymphadenopathy appriciated.  Symmetrical Chest wall movement, Good air movement bilaterally, CTAB RRR,No Gallops, Rubs or new  Murmurs, No Parasternal Heave +ve B.Sounds, Abd Soft, No tenderness, No organomegaly appriciated, No rebound - guarding or rigidity. No Cyanosis, Clubbing or edema, No new Rash or bruise     Data Review:    CBC Recent Labs  Lab 12/02/20 0216 12/02/20 1000 12/02/20 2130 12/03/20 0132 12/03/20 0823  WBC 7.2 7.7 8.0 7.9 8.3  HGB 8.3* 9.0* 9.4* 8.1* 8.3*  HCT 25.6* 28.2* 28.7* 25.6* 25.5*  PLT 132* 136* 137* 136* 144*  MCV 98.5 99.6 98.0 97.7 96.6  MCH 31.9 31.8 32.1 30.9 31.4  MCHC 32.4 31.9 32.8 31.6 32.5  RDW 13.5 13.6 13.6 13.8 13.5  LYMPHSABS 1.8 1.9 1.5 1.5 1.7  MONOABS 0.9 0.9 0.9 0.9 0.9  EOSABS 0.1 0.2 0.2 0.2 0.2  BASOSABS 0.0 0.0 0.0 0.0 0.0     Recent Labs  Lab 11/29/20 1129 11/29/20 2118 12/01/20 0050 12/01/20 0929 12/02/20 0216 12/02/20 1000 12/03/20 0132  NA  --   --  138  --  138  --  138  K  --   --  4.5  --  4.2  --  4.3  CL  --   --  108  --  108  --  107  CO2  --   --  26  --  25  --  23  GLUCOSE  --   --  143*  --  175*  --  225*  BUN  --   --  22  --  19  --  17  CREATININE  --   --  1.17*  --  1.06*  --  1.30*  CALCIUM  --   --  8.2*  --  8.1*  --  8.5*  AST  --   --  62*  --  35  --  27  ALT  --   --  66*  --  52*  --  39  ALKPHOS  --   --  31*  --  33*  --  33*  BILITOT  --   --  0.8  --  0.5  --  0.7  ALBUMIN  --   --  2.5*  --  2.4*  --  2.5*  MG  --   --   --   --   --   --  1.6*  INR 1.0  --   --  1.1  --   --   --   HGBA1C  --  6.5*  --   --   --   --   --   BNP  --   --   --   --   --  103.4* 78.9    ------------------------------------------------------------------------------------------------------------------ No results for input(s): CHOL, HDL, LDLCALC, TRIG, CHOLHDL, LDLDIRECT in the last 72 hours.  Lab Results  Component Value Date   HGBA1C 6.5 (H) 11/29/2020   ------------------------------------------------------------------------------------------------------------------ No results for input(s): TSH, T4TOTAL, T3FREE, THYROIDAB in the last 72 hours.  Invalid input(s): FREET3  Cardiac Enzymes No results for input(s): CKMB, TROPONINI, MYOGLOBIN in the last 168 hours.  Invalid input(s): CK ------------------------------------------------------------------------------------------------------------------    Component Value Date/Time   BNP 78.9 12/03/2020 0132    Micro Results Recent Results (from the past 240 hour(s))  SARS CORONAVIRUS 2 (TAT 6-24 HRS) Nasopharyngeal Nasopharyngeal Swab     Status: None   Collection Time: 12/01/20  9:04 AM   Specimen: Nasopharyngeal Swab  Result Value Ref Range Status   SARS Coronavirus 2 NEGATIVE NEGATIVE Final  Comment:  (NOTE) SARS-CoV-2 target nucleic acids are NOT DETECTED.  The SARS-CoV-2 RNA is generally detectable in upper and lower respiratory specimens during the acute phase of infection. Negative results do not preclude SARS-CoV-2 infection, do not rule out co-infections with other pathogens, and should not be used as the sole basis for treatment or other patient management decisions. Negative results must be combined with clinical observations, patient history, and epidemiological information. The expected result is Negative.  Fact Sheet for Patients: SugarRoll.be  Fact Sheet for Healthcare Providers: https://www.woods-mathews.com/  This test is not yet approved or cleared by the [place] FDA and  has been authorized for detection and/or diagnosis of SARS-CoV-2 by FDA under an Emergency Use Authorization (EUA). This EUA will remain  in effect (meaning this test can be used) for the duration of the COVID-19 declaration under Se ction 564(b)(1) of the Act, 21 U.S.C. section 360bbb-3(b)(1), unless the authorization is terminated or revoked sooner.  Performed at [facility], [place] [address] [place], [place] [address]     Radiology Reports CT ABDOMEN PELVIS WO CONTRAST  Result Date: 11/30/2020 CLINICAL DATA:  Known history of colorectal cancer and hepatocellular carcinoma, now with acute onset of abdominal pain following liver lesion biopsy. EXAM: CT ABDOMEN AND PELVIS WITHOUT CONTRAST TECHNIQUE: Multidetector CT imaging of the abdomen and pelvis was performed following the standard protocol without IV contrast. COMPARISON:  CT abdomen and pelvis-10/25/2020; 09/16/2018 FINDINGS: The lack of intravenous contrast limits the ability to evaluate solid abdominal organs. Lower chest: Limited visualization of the lower thorax demonstrates minimal dependent subpleural ground-glass atelectasis. Unchanged punctate (0.6 cm) sub pleural nodule within the  right middle lobe is unchanged since at least the 08/2018 examination and thus of benign etiology. No discrete focal airspace opacities. No pleural effusion. Normal heart size. No pericardial effusion. Calcifications of the mitral valve annulus. Hepatobiliary: Normal hepatic contour. Known infiltrative mass within the central aspect of the liver appears grossly unchanged. There is a minimal amount intraparenchymal air along the tract from the biopsy adjacent to the ill-defined mixed attenuating lesion within the left lobe of the liver (image 19 and 20, series 3). Interval development of an adjacent area of hypo attenuation within the subcapsular aspect of the lateral segment of the left lobe of the liver (image 17, series 3 with associated adjacent subcapsular hypoattenuating fluid which demonstrates Hounsfield units suggestive of hemorrhage (image 18, series 3. No definitive intraparenchymal extension of suspected subcapsular hemorrhage though note is made of a small amount perisplenic fluid. Post cholecystectomy. Pancreas: Normal noncontrast appearance of the pancreas. Spleen: Note is made of a small amount of perisplenic fluid which demonstrates Hounsfield units of 22 (image 20, series 3) Adrenals/Urinary Tract: Mild atrophy of the bilateral kidneys. No renal stones. There is a minimal amount of likely age and body habitus related perinephric stranding. No urine obstruction Normal noncontrast appearance the bilateral adrenal glands. Normal appearance of the urinary bladder given underdistention. Stomach/Bowel: Moderate to large colonic stool burden without evidence of enteric obstruction. Scattered colonic diverticulosis without evidence of superimposed acute diverticulitis. Stable sequela of right hemicolectomy without evidence of enteric obstruction. No discrete areas of bowel wall thickening. Small hiatal hernia. No pneumoperitoneum, pneumatosis or portal venous gas. Vascular/Lymphatic: Extensive  atherosclerotic plaque within normal caliber abdominal aorta. No bulky retroperitoneal, mesenteric, pelvic or inguinal lymphadenopathy on this noncontrast examination. Reproductive: Post hysterectomy. No discrete adnexal lesions. There is a trace amount of fluid in the pelvic cul-de-sac. Other: Somewhat nodular foci of subcutaneous  stranding within the abdominal pannus likely represents the location of subcutaneous medication administration. Mild diffuse body wall anasarca. Musculoskeletal: No definite acute or aggressive osseous abnormalities. Moderate multilevel lumbar spine DDD is suspected though incompletely evaluated. IMPRESSION: 1. Suspected development of intra parenchymal hematoma with associated subcapsular extension following ultrasound-guided liver lesion biopsy. No definite intra parenchymal extension however note is made of a small amount of perisplenic fluid. 2. Similar appearance of known infiltrative mass with the central aspects of the liver. 3. Stable sequela of right hemicolectomy without evidence of enteric obstruction. 4. Aortic Atherosclerosis (ICD10-I70.0). PLAN: Given above, patient subsequently underwent multiphase contrast-enhanced CT. Electronically Signed   By: Sandi Mariscal M.D.   On: 11/30/2020 09:11   US BIOPSY (LIVER)  Result Date: 11/29/2020 INDICATION: History of hepatocellular carcinoma as well as colon cancer, now with indeterminate liver lesions. Please perform ultrasound-guided liver lesion biopsy for tissue diagnostic purposes. EXAM: ULTRASOUND GUIDED LIVER LESION BIOPSY COMPARISON:  CT abdomen pelvis-10/26/2020; abdominal MRI-09/21/2020 MEDICATIONS: None ANESTHESIA/SEDATION: Fentanyl 50 mcg IV; Versed 1 mg IV Total Moderate Sedation time:  15 Minutes. The patient's level of consciousness and vital signs were monitored continuously by radiology nursing throughout the procedure under my direct supervision. COMPLICATIONS: None immediate. PROCEDURE: Informed written consent was  obtained from the patient after a discussion of the risks, benefits and alternatives to treatment. The patient understands and consents the procedure. A timeout was performed prior to the initiation of the procedure. Ultrasound scanning was performed of the right upper abdominal quadrant demonstrates an approximately 1.5 x 1.4 cm hypoechoic lesion within the left lobe of the liver correlating with the hypervascular lesion seen on preceding abdominal CT image 28, series 3. The procedure was planned. The midline of the upper abdomen was prepped and draped in the usual sterile fashion. The overlying soft tissues were anesthetized with 1% lidocaine with epinephrine. A 17 gauge, 6.8 cm co-axial needle was advanced into a peripheral aspect of the lesion. This was followed by 4 core biopsies with an 18 gauge core device under direct ultrasound guidance. The coaxial needle tract was embolized with a small amount of Gel-Foam slurry and superficial hemostasis was obtained with manual compression. Post procedural scanning was negative for definitive area of hemorrhage or additional complication. A dressing was placed. The patient tolerated the procedure well without immediate post procedural complication. IMPRESSION: Technically successful ultrasound guided core needle biopsy of indeterminate lesion within the left lobe of the liver. Electronically Signed   By: Sandi Mariscal M.D.   On: 11/29/2020 14:26   VAS [place] LOWER EXTREMITY VENOUS (DVT)  Result Date: 12/02/2020  Lower Venous DVT Study Indications: Pulmonary embolism 10/25/20.  Risk Factors: Cancer newly diagnosed hepatocellular carcinoma 11/29/20. Limitations: Body habitus. Comparison Study: No prior study Performing Technologist: Sharion Dove RVS  Examination Guidelines: A complete evaluation includes B-mode imaging, spectral Doppler, color Doppler, and power Doppler as needed of all accessible portions of each vessel. Bilateral testing is considered an integral part of  a complete examination. Limited examinations for reoccurring indications may be performed as noted. The reflux portion of the exam is performed with the patient in reverse Trendelenburg.  +---------+---------------+---------+-----------+----------+-------------------+ RIGHT    CompressibilityPhasicitySpontaneityPropertiesThrombus Aging      +---------+---------------+---------+-----------+----------+-------------------+ CFV      Partial        No       No                   Age Indeterminate   +---------+---------------+---------+-----------+----------+-------------------+ SFJ  Partial                                      Age Indeterminate   +---------+---------------+---------+-----------+----------+-------------------+ FV Prox  Full                                                             +---------+---------------+---------+-----------+----------+-------------------+ FV Mid   Full                                                             +---------+---------------+---------+-----------+----------+-------------------+ FV DistalFull                                                             +---------+---------------+---------+-----------+----------+-------------------+ PFV      Full                                                             +---------+---------------+---------+-----------+----------+-------------------+ POP      Full           Yes      Yes                                      +---------+---------------+---------+-----------+----------+-------------------+ PTV      Full                                                             +---------+---------------+---------+-----------+----------+-------------------+ PERO                                                  Not well visualized +---------+---------------+---------+-----------+----------+-------------------+    +---------+---------------+---------+-----------+----------+-------------------+ LEFT     CompressibilityPhasicitySpontaneityPropertiesThrombus Aging      +---------+---------------+---------+-----------+----------+-------------------+ CFV      Full           Yes      Yes                                      +---------+---------------+---------+-----------+----------+-------------------+ SFJ      Full                                                             +---------+---------------+---------+-----------+----------+-------------------+  FV Prox  Full                                                             +---------+---------------+---------+-----------+----------+-------------------+ FV Mid   Full                                                             +---------+---------------+---------+-----------+----------+-------------------+ FV DistalFull                                                             +---------+---------------+---------+-----------+----------+-------------------+ PFV      Full                                                             +---------+---------------+---------+-----------+----------+-------------------+ POP      Full           Yes      Yes                                      +---------+---------------+---------+-----------+----------+-------------------+ PTV      Full                                                             +---------+---------------+---------+-----------+----------+-------------------+ PERO                                                  Not well visualized +---------+---------------+---------+-----------+----------+-------------------+    Summary: RIGHT: - Findings consistent with age indeterminate deep vein thrombosis involving the right common femoral vein, and SF junction.  LEFT: - There is no evidence of deep vein thrombosis in the lower extremity. However, portions of this  examination were limited- see technologist comments above.  *See table(s) above for measurements and observations. Electronically signed by Servando Snare MD on 12/02/2020 at 9:00:54 AM.    Final    CT Angio Abd/Pel w/ and/or w/o  Result Date: 11/30/2020 CLINICAL DATA:  Concern for intraparenchymal hemorrhage with subcapsular extension preceding noncontrast CT following ultrasound-guided liver lesion biopsy. EXAM: CTA ABDOMEN AND PELVIS WITHOUT AND WITH CONTRAST TECHNIQUE: Multidetector CT imaging of the abdomen and pelvis was performed using the standard protocol during bolus administration of intravenous contrast. Multiplanar reconstructed images and MIPs were obtained and reviewed to evaluate the vascular anatomy. CONTRAST:  134mL OMNIPAQUE IOHEXOL 350  MG/ML SOLN COMPARISON:  Noncontrast CT scan of the abdomen and pelvis-earlier same day; CT abdomen pelvis-10/25/2020; 03/29/2019; chest CT-06/23/2017 FINDINGS: VASCULAR Aorta: Moderate to large amount of eccentric calcified atherosclerotic plaque throughout the normal caliber abdominal aorta. Calcified atherosclerotic plaque at the origin of the takeoff of the accessory left renal artery results in approximately 50% luminal narrowing (image 63, series 5). No evidence of abdominal aortic dissection or periaortic stranding. Celiac: Redemonstrated separate origins of the common hepatic and splenic arteries in lieu of a conventional celiac trunk. There is a minimal amount of eccentric noncalcified atherosclerotic plaque involving the origin of the takeoff of the common hepatic artery, not resulting in hemodynamically significant stenosis. The splenic artery is widely patent without hemodynamically significant stenosis. SMA: There is a minimal amount of eccentric calcified atherosclerotic plaque involving the origin and main trunk of the SMA, not resulting in hemodynamically significant stenosis. Conventional branching pattern. The distal tributaries the SMA appear  widely patent without discrete lumen filling defect. Renals: Note is made of 3 separate left-sided renal arteries there is a moderate amount of eccentric predominantly calcified atherosclerotic plaque involving the origin of the dominant left renal artery not definitely resulting in hemodynamically significant stenosis. The solitary right renal artery is widely patent without hemodynamically significant narrowing. No vessel irregularity to suggest FMD. IMA: Disease at its origin though remains patent. 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 The bilateral common iliac arteries are disease though without a definitive hemodynamically significant stenosis. The bilateral internal iliac arteries are disease though patent and of normal caliber. Bilateral external iliac arteries are mildly disease though patent and of caliber. Proximal Outflow: There is a minimal amount of calcified atherosclerotic plaque involving the bilateral common femoral arteries, not resulting in hemodynamically significant stenosis. The imaged portions of the bilateral deep and superficial femoral arteries are of caliber and widely patent without hemodynamically significant narrowing. Veins: The IVC appears widely patent as does the portal venous system. Review of the MIP images confirms the above findings. _________________________________________________________ NON-VASCULAR Lower chest: Limited visualization of lower thorax demonstrates minimal dependent subpleural ground-glass atelectasis. The approximately 0.7 cm subpleural nodule within the right middle lobe (image 22, series 9), is unchanged since at least the 06/2017 examination and thus a benign etiology. No pleural effusion or pneumothorax. Normal heart size. No pericardial effusion. Hepatobiliary: Normal hepatic contour. Redemonstrated mixed attenuating mass within the central aspect of  the liver measuring approximately 3.9 x 3.3 cm (image 19, series 6). Redemonstrated approximately 1.3 x 1.0 cm hypoattenuating lesion within the left lobe of the liver with adjacent Gel-Foam embolization material (representative images 18 and 19, series 6). Interval development of an adjacent intraparenchymal hematomas with medial component measuring approximately 2.2 x 1.3 cm (image 22, series 6) and dominant component within the more lateral aspect of the lateral segment of left lobe of the liver measuring approximately 2.8 x 2.1 cm (image 17, series 6) and appears to extend to the liver capsule (image 19, series 6) with associated adjacent subcapsular hematoma. This finding is associated with minimal pooling of contrast within the central aspect of the hematoma (image 17, series 6). No definitive intra part parenchymal extension however note is made of a small amount of perisplenic fluid. Pancreas: Normal appearance of the pancreas. Spleen: Note is made of a small amount of perisplenic fluid. Adrenals/Urinary Tract: There is symmetric enhancement of the bilateral kidneys. No evidence of nephrolithiasis on this  postcontrast examination. No discrete renal lesions. There is a minimal amount of likely age and body habitus related bilateral perinephric stranding. No urine obstruction. Normal appearance of the bilateral adrenal glands. The urinary bladder is underdistended. Stomach/Bowel: Colonic diverticulosis without evidence of superimposed acute diverticulitis. Moderate colonic stool burden. Sequela right hemicolectomy without evidence of enteric obstruction. Small hiatal hernia. No pneumoperitoneum, pneumatosis or portal venous gas. Lymphatic: No bulky retroperitoneal, mesenteric, pelvic or inguinal lymphadenopathy. Reproductive: Post hysterectomy. No discrete adnexal lesions. Trace amount of fluid within the pelvic cul-de-sac. Other: Nodular thickening involving the subcutaneous tissues of the undersurface of the  pannus, likely at the location of subcutaneous medication administration. Minimal amount of subcutaneous edema about midline of the low back. Musculoskeletal: No acute or aggressive osseous abnormalities. Stigmata of dish within the thoracic spine. Posteriorly directed disc osteophyte complex at T12-L1. IMPRESSION: Vascular Impression: 1. Post ultrasound-guided left sided liver lesion biopsy complicated by development of intraparenchymal hematomas. There is pooling of contrast within the dominant more laterally located hematoma with apparent extension of the hematoma to the liver capsule edge with associated adjacent subcapsular hemorrhage. 2. Small amount of perisplenic fluid. Otherwise, no definitive intraparenchymal extension. 3. Large amount of atherosclerotic plaque including a suspected hemodynamically significant stenosis within the infrarenal abdominal aorta. Aortic Atherosclerosis (ICD10-I70.0). 4. 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 5. Non conventional arterial anatomy including separate origins of the common hepatic and splenic arteries in lieu of a conventional celiac trunk as detailed above. Nonvascular Impression: 1. Image grossly unchanged appearance of known dominant infiltrative mass within the central aspect of the liver, compatible with biopsy-proven hepatocellular carcinoma. 2. Sequela of right hemicolectomy without evidence of enteric obstruction. 3. Colonic diverticulosis without evidence of superimposed acute diverticulitis. 4. Small hiatal hernia. PLAN: Given above findings the decision was made to admit the patient for observation as she fortunately remained hemodynamically stable with subjective improvement in her transiently worsening abdominal pain. We will monitor the patient's H and H and keep the patient NPO in case arteriogram and embolization is ultimately required. Electronically Signed   By: Sandi Mariscal  M.D.   On: 11/30/2020 09:47

## 2020-12-04 LAB — CBC WITH DIFFERENTIAL/PLATELET
Abs Immature Granulocytes: 0.02 10*3/uL (ref 0.00–0.07)
Abs Immature Granulocytes: 0.02 10*3/uL (ref 0.00–0.07)
Basophils Absolute: 0 10*3/uL (ref 0.0–0.1)
Basophils Absolute: 0 10*3/uL (ref 0.0–0.1)
Basophils Relative: 0 %
Basophils Relative: 0 %
Eosinophils Absolute: 0.1 10*3/uL (ref 0.0–0.5)
Eosinophils Absolute: 0.1 10*3/uL (ref 0.0–0.5)
Eosinophils Relative: 2 %
Eosinophils Relative: 2 %
HCT: 25.9 % — ABNORMAL LOW (ref 36.0–46.0)
HCT: 26.1 % — ABNORMAL LOW (ref 36.0–46.0)
Hemoglobin: 8.4 g/dL — ABNORMAL LOW (ref 12.0–15.0)
Hemoglobin: 8.4 g/dL — ABNORMAL LOW (ref 12.0–15.0)
Immature Granulocytes: 0 %
Immature Granulocytes: 0 %
Lymphocytes Relative: 18 %
Lymphocytes Relative: 22 %
Lymphs Abs: 1.2 10*3/uL (ref 0.7–4.0)
Lymphs Abs: 1.5 10*3/uL (ref 0.7–4.0)
MCH: 31.3 pg (ref 26.0–34.0)
MCH: 30.9 pg (ref 26.0–34.0)
MCHC: 32.4 g/dL (ref 30.0–36.0)
MCHC: 32.2 g/dL (ref 30.0–36.0)
MCV: 96.6 fL (ref 80.0–100.0)
MCV: 96 fL (ref 80.0–100.0)
Monocytes Absolute: 0.6 10*3/uL (ref 0.1–1.0)
Monocytes Absolute: 0.6 10*3/uL (ref 0.1–1.0)
Monocytes Relative: 9 %
Monocytes Relative: 9 %
Neutro Abs: 4.8 10*3/uL (ref 1.7–7.7)
Neutro Abs: 4.4 10*3/uL (ref 1.7–7.7)
Neutrophils Relative %: 71 %
Neutrophils Relative %: 67 %
Platelets: 162 10*3/uL (ref 150–400)
Platelets: 173 10*3/uL (ref 150–400)
RBC: 2.68 MIL/uL — ABNORMAL LOW (ref 3.87–5.11)
RBC: 2.72 MIL/uL — ABNORMAL LOW (ref 3.87–5.11)
RDW: 13.6 % (ref 11.5–15.5)
RDW: 13.8 % (ref 11.5–15.5)
WBC: 6.8 10*3/uL (ref 4.0–10.5)
WBC: 6.6 10*3/uL (ref 4.0–10.5)
nRBC: 0 % (ref 0.0–0.2)
nRBC: 0 % (ref 0.0–0.2)

## 2020-12-04 LAB — COMPREHENSIVE METABOLIC PANEL
ALT: 45 U/L — ABNORMAL HIGH (ref 0–44)
AST: 40 U/L (ref 15–41)
Albumin: 2.4 g/dL — ABNORMAL LOW (ref 3.5–5.0)
Alkaline Phosphatase: 37 U/L — ABNORMAL LOW (ref 38–126)
Anion gap: 6 (ref 5–15)
BUN: 14 mg/dL (ref 8–23)
CO2: 25 mmol/L (ref 22–32)
Calcium: 8.3 mg/dL — ABNORMAL LOW (ref 8.9–10.3)
Chloride: 104 mmol/L (ref 98–111)
Creatinine, Ser: 0.99 mg/dL (ref 0.44–1.00)
GFR, Estimated: 59 mL/min — ABNORMAL LOW (ref >60)
Glucose, Bld: 162 mg/dL — ABNORMAL HIGH (ref 70–99)
Potassium: 4.1 mmol/L (ref 3.5–5.1)
Sodium: 135 mmol/L (ref 135–145)
Total Bilirubin: 0.8 mg/dL (ref 0.3–1.2)
Total Protein: 5.1 g/dL — ABNORMAL LOW (ref 6.5–8.1)

## 2020-12-04 LAB — MAGNESIUM: Magnesium: 1.8 mg/dL (ref 1.7–2.4)

## 2020-12-04 LAB — BRAIN NATRIURETIC PEPTIDE: B Natriuretic Peptide: 67.3 pg/mL (ref 0.0–100.0)

## 2020-12-04 LAB — GLUCOSE, CAPILLARY: Glucose-Capillary: 169 mg/dL — ABNORMAL HIGH (ref 70–99)

## 2020-12-04 NOTE — Progress Notes (Signed)
Consult  Progress Note                                                                                                                                                                                                          Patient Demographics:    Wanda Curry, is a 75 y.o. female, DOB - [birth year], JKK:[id number]  Outpatient Primary MD for the patient is Earney Mallet, MD    LOS - 2  Admit date - 11/29/2020    No chief complaint on file.      Brief Narrative (HPI from H&P)   Wanda Curry is a 75 y.o. female with medical history significant of PE on Xarelto, Hepatocellular carcinoma s/p bland hepatic embolization 07/24/20 in IR , Stage IIa (CT3CN0) colon cancer s/p Right hemicolectomy on 10/10/2016, T2DM, HLD, chronic diarrhea, RA on MTX who presented on 11/29/20 for liver lesion biopsy, post biopsy she developed abdominal pain and lightheadedness and was found to have post biopsy intra-abdominal bleeding small from the site of biopsy.  She was admitted by IR & TRH was requested to consult for medical management.   Subjective:   Patient in bed, appears comfortable, denies any headache, no fever, no chest pain or pressure, no shortness of breath , no abdominal pain. No new focal weakness.   Assessment  & Plan :     1.  Post liver biopsy development of intraparenchymal liver bleed causing acute blood loss anemia.  Her baseline hemoglobin seems to be around 13.5 but it has fortunately stabilized around 8.5.  No further signs of ongoing bleeding continue to monitor cautiously.  Stable H&H with no evidence of ongoing bleeding, mild drop in H&H due to with heme dilution  from IV fluids on 12/03/2020.  She appears stable for discharge from medical standpoint.  2.  History of DVT AND PE.  She is baseline on Xarelto, anticoagulation has been resumed by primary team we will continue to monitor.   3.  History of hepatocellular  carcinoma s/p hepatic embolization in December 2021, stage IIa colon cancer status post right hemicolectomy.  Outpatient oncology and PCP follow-up.  4.  Chronic diarrhea.  Supportive care.  5.  History of RA.  On methotrexate.  Outpatient resumption of medication and monitoring of LFTs by PCP and rheumatologist.  6.  AKI on CKD stage III A baseline creatinine around 1.3.  Gentle hydration on 12/03/2020, back to her baseline follow with PCP.  7.  Hypertension.  Stable on home dose Coreg.  8.  GERD.  On PPI.  9.  Restless leg syndrome.  Continue Requip.  10.  DM type II.  On sliding scale will monitor and adjust.  Lab Results  Component Value Date   HGBA1C 6.5 (H) 11/29/2020   CBG (last 3)  Recent Labs    12/03/20 1712 12/03/20 2050 12/04/20 0744  GLUCAP 250* 234* 169*          Condition - Guarded  Family Communication  : per Primary team IR  Code Status :  Full  Consults  :  TRH consulting for IR  PUD Prophylaxis :    Procedures  :     Leg US - Findings consistent with age indeterminate deep vein thrombosis involving the right common femoral vein, and SF junction      Disposition Plan  :    Status is: Observation  DVT Prophylaxis  :  Per IR primary team  Lab Results  Component Value Date   PLT 173 12/04/2020    Diet :  Diet Order            Diet - low sodium heart healthy           Diet regular Room service appropriate? Yes; Fluid consistency: Thin  Diet effective now                  Inpatient Medications  Scheduled Meds: . carvedilol  3.125 mg Oral BID WC  . docusate sodium  100 mg Oral BID  . folic acid  1 mg Oral Daily  . insulin aspart  0-15 Units Subcutaneous TID WC  . insulin aspart  0-5 Units Subcutaneous QHS  . insulin aspart  4 Units Subcutaneous TID WC  . methotrexate  20 mg Oral Q Sun  . pantoprazole  40 mg Oral Daily  . rivaroxaban  10 mg Oral Q supper  . rOPINIRole  1 mg Oral BH-q7a   Continuous Infusions:  PRN  Meds:.acetaminophen, loperamide, ondansetron (ZOFRAN) IV, sodium chloride  Antibiotics  :    Anti-infectives (From admission, onward)   None       Time Spent in minutes  30   Lala Lund M.D on 12/04/2020 at 10:02 AM  To page go to [email or web address]   [facility] -  Office  [phone]   See all Orders from today for further details    Objective:   Vitals:   12/03/20 0547 12/03/20 1454 12/03/20 2051 12/04/20 0545  BP: (!) 122/46 (!) 154/63 (!) 139/50 (!) 131/48  Pulse: 77 78 85 90  Resp: 18 19 20 20   Temp: 97.6 F (36.4 C) 97.8 F (36.6 C) 97.9 F (36.6 C) 97.8 F (36.6 C)  TempSrc: Oral Oral Axillary Axillary  SpO2: 98% 98% 97% 98%  Weight:      Height:        Wt Readings from Last 3 Encounters:  12/02/20 108 kg  11/21/20 103.3 kg  07/25/19 118.5 kg     Intake/Output Summary (Last 24 hours) at 12/04/2020 1002 Last data filed at 12/04/2020 0516 Gross per 24 hour  Intake 240 ml  Output --  Net 240 ml     Physical Exam  Awake Alert, No new F.N deficits, Normal affect [place].AT,PERRAL Supple Neck,No JVD, No cervical lymphadenopathy appriciated.  Symmetrical Chest wall movement, Good air movement bilaterally, CTAB RRR,No Gallops, Rubs or new  Murmurs, No Parasternal Heave +ve B.Sounds, Abd Soft, No tenderness, No organomegaly appriciated, No rebound - guarding or rigidity. No Cyanosis, Clubbing or edema, No new Rash or bruise    Data Review:    CBC Recent Labs  Lab 12/03/20 0132 12/03/20 0823 12/03/20 1957 12/04/20 0102 12/04/20 0847  WBC 7.9 8.3 7.6 6.8 6.6  HGB 8.1* 8.3* 9.3* 8.4* 8.4*  HCT 25.6* 25.5* 28.9* 25.9* 26.1*  PLT 136* 144* 190 162 173  MCV 97.7 96.6 98.0 96.6 96.0  MCH 30.9 31.4 31.5 31.3 30.9  MCHC 31.6 32.5 32.2 32.4 32.2  RDW 13.8 13.5 13.7 13.6 13.8  LYMPHSABS 1.5 1.7 1.0 1.2 1.5  MONOABS 0.9 0.9 0.7 0.6 0.6  EOSABS 0.2 0.2 0.1 0.1 0.1  BASOSABS 0.0 0.0 0.0 0.0 0.0    Recent Labs  Lab 11/29/20 1129  11/29/20 2118 12/01/20 0050 12/01/20 0929 12/02/20 0216 12/02/20 1000 12/03/20 0132 12/04/20 0102  NA  --   --  138  --  138  --  138 135  K  --   --  4.5  --  4.2  --  4.3 4.1  CL  --   --  108  --  108  --  107 104  CO2  --   --  26  --  25  --  23 25  GLUCOSE  --   --  143*  --  175*  --  225* 162*  BUN  --   --  22  --  19  --  17 14  CREATININE  --   --  1.17*  --  1.06*  --  1.30* 0.99  CALCIUM  --   --  8.2*  --  8.1*  --  8.5* 8.3*  AST  --   --  62*  --  35  --  27 40  ALT  --   --  66*  --  52*  --  39 45*  ALKPHOS  --   --  31*  --  33*  --  33* 37*  BILITOT  --   --  0.8  --  0.5  --  0.7 0.8  ALBUMIN  --   --  2.5*  --  2.4*  --  2.5* 2.4*  MG  --   --   --   --   --   --  1.6* 1.8  INR 1.0  --   --  1.1  --   --   --   --   HGBA1C  --  6.5*  --   --   --   --   --   --   BNP  --   --   --   --   --  103.4* 78.9 67.3    ------------------------------------------------------------------------------------------------------------------ No results for input(s): CHOL, HDL, LDLCALC, TRIG, CHOLHDL, LDLDIRECT in the last 72 hours.  Lab Results  Component Value Date   HGBA1C 6.5 (H) 11/29/2020   ------------------------------------------------------------------------------------------------------------------ No results for input(s): TSH, T4TOTAL, T3FREE, THYROIDAB in the last 72 hours.  Invalid input(s): FREET3  Cardiac Enzymes No results for input(s): CKMB, TROPONINI, MYOGLOBIN in the last 168 hours.  Invalid input(s): CK ------------------------------------------------------------------------------------------------------------------    Component Value Date/Time   BNP 67.3 12/04/2020 0102    Micro Results Recent Results (from the past 240 hour(s))  SARS CORONAVIRUS 2 (TAT 6-24 HRS) Nasopharyngeal Nasopharyngeal Swab     Status: None   Collection Time: 12/01/20  9:04 AM   Specimen: Nasopharyngeal Swab  Result Value Ref Range Status   SARS Coronavirus 2  NEGATIVE NEGATIVE Final    Comment: (NOTE) SARS-CoV-2 target nucleic acids are NOT DETECTED.  The SARS-CoV-2 RNA is generally detectable in upper and lower respiratory specimens during the acute phase of infection. Negative results do not preclude SARS-CoV-2 infection, do not rule out co-infections with other pathogens, and should not be used as the sole basis for treatment or other patient management decisions. Negative results must be combined with clinical observations, patient history, and epidemiological information. The expected result is Negative.  Fact Sheet for Patients: SugarRoll.be  Fact Sheet for Healthcare Providers: https://www.woods-mathews.com/  This test is not yet approved or cleared by the [place] FDA and  has been authorized for detection and/or diagnosis of SARS-CoV-2 by FDA under an Emergency Use Authorization (EUA). This EUA will remain  in effect (meaning this test can be used) for the duration of the COVID-19 declaration under Se ction 564(b)(1) of the Act, 21 U.S.C. section 360bbb-3(b)(1), unless the authorization is terminated or revoked sooner.  Performed at [facility], [place] [address] [place], [place] [address]     Radiology Reports CT ABDOMEN PELVIS WO CONTRAST  Result Date: 11/30/2020 CLINICAL DATA:  Known history of colorectal cancer and hepatocellular carcinoma, now with acute onset of abdominal pain following liver lesion biopsy. EXAM: CT ABDOMEN AND PELVIS WITHOUT CONTRAST TECHNIQUE: Multidetector CT imaging of the abdomen and pelvis was performed following the standard protocol without IV contrast. COMPARISON:  CT abdomen and pelvis-10/25/2020; 09/16/2018 FINDINGS: The lack of intravenous contrast limits the ability to evaluate solid abdominal organs. Lower chest: Limited visualization of the lower thorax demonstrates minimal dependent subpleural ground-glass atelectasis. Unchanged punctate  (0.6 cm) sub pleural nodule within the right middle lobe is unchanged since at least the 08/2018 examination and thus of benign etiology. No discrete focal airspace opacities. No pleural effusion. Normal heart size. No pericardial effusion. Calcifications of the mitral valve annulus. Hepatobiliary: Normal hepatic contour. Known infiltrative mass within the central aspect of the liver appears grossly unchanged. There is a minimal amount intraparenchymal air along the tract from the biopsy adjacent to the ill-defined mixed attenuating lesion within the left lobe of the liver (image 19 and 20, series 3). Interval development of an adjacent area of hypo attenuation within the subcapsular aspect of the lateral segment of the left lobe of the liver (image 17, series 3 with associated adjacent subcapsular hypoattenuating fluid which demonstrates Hounsfield units suggestive of hemorrhage (image 18, series 3. No definitive intraparenchymal extension of suspected subcapsular hemorrhage though note is made of a small amount perisplenic fluid. Post cholecystectomy. Pancreas: Normal noncontrast appearance of the pancreas. Spleen: Note is made of a small amount of perisplenic fluid which demonstrates Hounsfield units of 22 (image 20, series 3) Adrenals/Urinary Tract: Mild atrophy of the bilateral kidneys. No renal stones. There is a minimal amount of likely age and body habitus related perinephric stranding. No urine obstruction Normal noncontrast appearance the bilateral adrenal glands. Normal appearance of the urinary bladder given underdistention. Stomach/Bowel: Moderate to large colonic stool burden without evidence of enteric obstruction. Scattered colonic diverticulosis without evidence of superimposed acute diverticulitis. Stable sequela of right hemicolectomy without evidence of enteric obstruction. No discrete areas of bowel wall thickening. Small hiatal hernia. No pneumoperitoneum, pneumatosis or portal venous gas.  Vascular/Lymphatic: Extensive atherosclerotic plaque within normal caliber abdominal aorta. No bulky retroperitoneal, mesenteric, pelvic or inguinal lymphadenopathy on this noncontrast examination.  Reproductive: Post hysterectomy. No discrete adnexal lesions. There is a trace amount of fluid in the pelvic cul-de-sac. Other: Somewhat nodular foci of subcutaneous stranding within the abdominal pannus likely represents the location of subcutaneous medication administration. Mild diffuse body wall anasarca. Musculoskeletal: No definite acute or aggressive osseous abnormalities. Moderate multilevel lumbar spine DDD is suspected though incompletely evaluated. IMPRESSION: 1. Suspected development of intra parenchymal hematoma with associated subcapsular extension following ultrasound-guided liver lesion biopsy. No definite intra parenchymal extension however note is made of a small amount of perisplenic fluid. 2. Similar appearance of known infiltrative mass with the central aspects of the liver. 3. Stable sequela of right hemicolectomy without evidence of enteric obstruction. 4. Aortic Atherosclerosis (ICD10-I70.0). PLAN: Given above, patient subsequently underwent multiphase contrast-enhanced CT. Electronically Signed   By: Sandi Mariscal M.D.   On: 11/30/2020 09:11   US BIOPSY (LIVER)  Result Date: 11/29/2020 INDICATION: History of hepatocellular carcinoma as well as colon cancer, now with indeterminate liver lesions. Please perform ultrasound-guided liver lesion biopsy for tissue diagnostic purposes. EXAM: ULTRASOUND GUIDED LIVER LESION BIOPSY COMPARISON:  CT abdomen pelvis-10/26/2020; abdominal MRI-09/21/2020 MEDICATIONS: None ANESTHESIA/SEDATION: Fentanyl 50 mcg IV; Versed 1 mg IV Total Moderate Sedation time:  15 Minutes. The patient's level of consciousness and vital signs were monitored continuously by radiology nursing throughout the procedure under my direct supervision. COMPLICATIONS: None immediate.  PROCEDURE: Informed written consent was obtained from the patient after a discussion of the risks, benefits and alternatives to treatment. The patient understands and consents the procedure. A timeout was performed prior to the initiation of the procedure. Ultrasound scanning was performed of the right upper abdominal quadrant demonstrates an approximately 1.5 x 1.4 cm hypoechoic lesion within the left lobe of the liver correlating with the hypervascular lesion seen on preceding abdominal CT image 28, series 3. The procedure was planned. The midline of the upper abdomen was prepped and draped in the usual sterile fashion. The overlying soft tissues were anesthetized with 1% lidocaine with epinephrine. A 17 gauge, 6.8 cm co-axial needle was advanced into a peripheral aspect of the lesion. This was followed by 4 core biopsies with an 18 gauge core device under direct ultrasound guidance. The coaxial needle tract was embolized with a small amount of Gel-Foam slurry and superficial hemostasis was obtained with manual compression. Post procedural scanning was negative for definitive area of hemorrhage or additional complication. A dressing was placed. The patient tolerated the procedure well without immediate post procedural complication. IMPRESSION: Technically successful ultrasound guided core needle biopsy of indeterminate lesion within the left lobe of the liver. Electronically Signed   By: Sandi Mariscal M.D.   On: 11/29/2020 14:26   VAS [place] LOWER EXTREMITY VENOUS (DVT)  Result Date: 12/02/2020  Lower Venous DVT Study Indications: Pulmonary embolism 10/25/20.  Risk Factors: Cancer newly diagnosed hepatocellular carcinoma 11/29/20. Limitations: Body habitus. Comparison Study: No prior study Performing Technologist: Sharion Dove RVS  Examination Guidelines: A complete evaluation includes B-mode imaging, spectral Doppler, color Doppler, and power Doppler as needed of all accessible portions of each vessel. Bilateral  testing is considered an integral part of a complete examination. Limited examinations for reoccurring indications may be performed as noted. The reflux portion of the exam is performed with the patient in reverse Trendelenburg.  +---------+---------------+---------+-----------+----------+-------------------+ RIGHT    CompressibilityPhasicitySpontaneityPropertiesThrombus Aging      +---------+---------------+---------+-----------+----------+-------------------+ CFV      Partial        No       No  Age Indeterminate   +---------+---------------+---------+-----------+----------+-------------------+ SFJ      Partial                                      Age Indeterminate   +---------+---------------+---------+-----------+----------+-------------------+ FV Prox  Full                                                             +---------+---------------+---------+-----------+----------+-------------------+ FV Mid   Full                                                             +---------+---------------+---------+-----------+----------+-------------------+ FV DistalFull                                                             +---------+---------------+---------+-----------+----------+-------------------+ PFV      Full                                                             +---------+---------------+---------+-----------+----------+-------------------+ POP      Full           Yes      Yes                                      +---------+---------------+---------+-----------+----------+-------------------+ PTV      Full                                                             +---------+---------------+---------+-----------+----------+-------------------+ PERO                                                  Not well visualized +---------+---------------+---------+-----------+----------+-------------------+    +---------+---------------+---------+-----------+----------+-------------------+ LEFT     CompressibilityPhasicitySpontaneityPropertiesThrombus Aging      +---------+---------------+---------+-----------+----------+-------------------+ CFV      Full           Yes      Yes                                      +---------+---------------+---------+-----------+----------+-------------------+ SFJ      Full                                                             +---------+---------------+---------+-----------+----------+-------------------+  FV Prox  Full                                                             +---------+---------------+---------+-----------+----------+-------------------+ FV Mid   Full                                                             +---------+---------------+---------+-----------+----------+-------------------+ FV DistalFull                                                             +---------+---------------+---------+-----------+----------+-------------------+ PFV      Full                                                             +---------+---------------+---------+-----------+----------+-------------------+ POP      Full           Yes      Yes                                      +---------+---------------+---------+-----------+----------+-------------------+ PTV      Full                                                             +---------+---------------+---------+-----------+----------+-------------------+ PERO                                                  Not well visualized +---------+---------------+---------+-----------+----------+-------------------+    Summary: RIGHT: - Findings consistent with age indeterminate deep vein thrombosis involving the right common femoral vein, and SF junction.  LEFT: - There is no evidence of deep vein thrombosis in the lower extremity. However, portions of this  examination were limited- see technologist comments above.  *See table(s) above for measurements and observations. Electronically signed by Servando Snare MD on 12/02/2020 at 9:00:54 AM.    Final    CT Angio Abd/Pel w/ and/or w/o  Result Date: 11/30/2020 CLINICAL DATA:  Concern for intraparenchymal hemorrhage with subcapsular extension preceding noncontrast CT following ultrasound-guided liver lesion biopsy. EXAM: CTA ABDOMEN AND PELVIS WITHOUT AND WITH CONTRAST TECHNIQUE: Multidetector CT imaging of the abdomen and pelvis was performed using the standard protocol during bolus administration of intravenous contrast. Multiplanar reconstructed images and MIPs were obtained and reviewed to evaluate the vascular anatomy. CONTRAST:  171mL OMNIPAQUE IOHEXOL 350  MG/ML SOLN COMPARISON:  Noncontrast CT scan of the abdomen and pelvis-earlier same day; CT abdomen pelvis-10/25/2020; 03/29/2019; chest CT-06/23/2017 FINDINGS: VASCULAR Aorta: Moderate to large amount of eccentric calcified atherosclerotic plaque throughout the normal caliber abdominal aorta. Calcified atherosclerotic plaque at the origin of the takeoff of the accessory left renal artery results in approximately 50% luminal narrowing (image 63, series 5). No evidence of abdominal aortic dissection or periaortic stranding. Celiac: Redemonstrated separate origins of the common hepatic and splenic arteries in lieu of a conventional celiac trunk. There is a minimal amount of eccentric noncalcified atherosclerotic plaque involving the origin of the takeoff of the common hepatic artery, not resulting in hemodynamically significant stenosis. The splenic artery is widely patent without hemodynamically significant stenosis. SMA: There is a minimal amount of eccentric calcified atherosclerotic plaque involving the origin and main trunk of the SMA, not resulting in hemodynamically significant stenosis. Conventional branching pattern. The distal tributaries the SMA appear  widely patent without discrete lumen filling defect. Renals: Note is made of 3 separate left-sided renal arteries there is a moderate amount of eccentric predominantly calcified atherosclerotic plaque involving the origin of the dominant left renal artery not definitely resulting in hemodynamically significant stenosis. The solitary right renal artery is widely patent without hemodynamically significant narrowing. No vessel irregularity to suggest FMD. IMA: Disease at its origin though remains patent. 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 The bilateral common iliac arteries are disease though without a definitive hemodynamically significant stenosis. The bilateral internal iliac arteries are disease though patent and of normal caliber. Bilateral external iliac arteries are mildly disease though patent and of caliber. Proximal Outflow: There is a minimal amount of calcified atherosclerotic plaque involving the bilateral common femoral arteries, not resulting in hemodynamically significant stenosis. The imaged portions of the bilateral deep and superficial femoral arteries are of caliber and widely patent without hemodynamically significant narrowing. Veins: The IVC appears widely patent as does the portal venous system. Review of the MIP images confirms the above findings. _________________________________________________________ NON-VASCULAR Lower chest: Limited visualization of lower thorax demonstrates minimal dependent subpleural ground-glass atelectasis. The approximately 0.7 cm subpleural nodule within the right middle lobe (image 22, series 9), is unchanged since at least the 06/2017 examination and thus a benign etiology. No pleural effusion or pneumothorax. Normal heart size. No pericardial effusion. Hepatobiliary: Normal hepatic contour. Redemonstrated mixed attenuating mass within the central aspect of  the liver measuring approximately 3.9 x 3.3 cm (image 19, series 6). Redemonstrated approximately 1.3 x 1.0 cm hypoattenuating lesion within the left lobe of the liver with adjacent Gel-Foam embolization material (representative images 18 and 19, series 6). Interval development of an adjacent intraparenchymal hematomas with medial component measuring approximately 2.2 x 1.3 cm (image 22, series 6) and dominant component within the more lateral aspect of the lateral segment of left lobe of the liver measuring approximately 2.8 x 2.1 cm (image 17, series 6) and appears to extend to the liver capsule (image 19, series 6) with associated adjacent subcapsular hematoma. This finding is associated with minimal pooling of contrast within the central aspect of the hematoma (image 17, series 6). No definitive intra part parenchymal extension however note is made of a small amount of perisplenic fluid. Pancreas: Normal appearance of the pancreas. Spleen: Note is made of a small amount of perisplenic fluid. Adrenals/Urinary Tract: There is symmetric enhancement of the bilateral kidneys. No evidence of nephrolithiasis on this  postcontrast examination. No discrete renal lesions. There is a minimal amount of likely age and body habitus related bilateral perinephric stranding. No urine obstruction. Normal appearance of the bilateral adrenal glands. The urinary bladder is underdistended. Stomach/Bowel: Colonic diverticulosis without evidence of superimposed acute diverticulitis. Moderate colonic stool burden. Sequela right hemicolectomy without evidence of enteric obstruction. Small hiatal hernia. No pneumoperitoneum, pneumatosis or portal venous gas. Lymphatic: No bulky retroperitoneal, mesenteric, pelvic or inguinal lymphadenopathy. Reproductive: Post hysterectomy. No discrete adnexal lesions. Trace amount of fluid within the pelvic cul-de-sac. Other: Nodular thickening involving the subcutaneous tissues of the undersurface of the  pannus, likely at the location of subcutaneous medication administration. Minimal amount of subcutaneous edema about midline of the low back. Musculoskeletal: No acute or aggressive osseous abnormalities. Stigmata of dish within the thoracic spine. Posteriorly directed disc osteophyte complex at T12-L1. IMPRESSION: Vascular Impression: 1. Post ultrasound-guided left sided liver lesion biopsy complicated by development of intraparenchymal hematomas. There is pooling of contrast within the dominant more laterally located hematoma with apparent extension of the hematoma to the liver capsule edge with associated adjacent subcapsular hemorrhage. 2. Small amount of perisplenic fluid. Otherwise, no definitive intraparenchymal extension. 3. Large amount of atherosclerotic plaque including a suspected hemodynamically significant stenosis within the infrarenal abdominal aorta. Aortic Atherosclerosis (ICD10-I70.0). 4. 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 5. Non conventional arterial anatomy including separate origins of the common hepatic and splenic arteries in lieu of a conventional celiac trunk as detailed above. Nonvascular Impression: 1. Image grossly unchanged appearance of known dominant infiltrative mass within the central aspect of the liver, compatible with biopsy-proven hepatocellular carcinoma. 2. Sequela of right hemicolectomy without evidence of enteric obstruction. 3. Colonic diverticulosis without evidence of superimposed acute diverticulitis. 4. Small hiatal hernia. PLAN: Given above findings the decision was made to admit the patient for observation as she fortunately remained hemodynamically stable with subjective improvement in her transiently worsening abdominal pain. We will monitor the patient's H and H and keep the patient NPO in case arteriogram and embolization is ultimately required. Electronically Signed   By: Sandi Mariscal  M.D.   On: 11/30/2020 09:47

## 2020-12-04 NOTE — Discharge Summary (Signed)
Patient ID: Wanda Curry MRN: [id number] DOB/AGE: Nov 13, 1945 75 y.o.  Admit date: 11/29/2020 Discharge date: 12/04/2020  Supervising Physician: Ruthann Cancer  Patient Status: [facility] - In-pt  Admission Diagnoses: Liver lesion  Discharge Diagnoses:  Principal Problem:   Lesion of liver Active Problems:   Colon cancer ([place])   History of pulmonary embolism   Type 2 diabetes mellitus (HCC)   Rheumatoid arthritis ([place])   HTN (hypertension)   HCC (hepatocellular carcinoma) ([place])   Hemorrhage   Acute blood loss anemia   Right leg DVT (HCC)   Chronic diarrhea   RA (rheumatoid arthritis) (HCC)   Obesity, Class III, BMI 40-49.9 (morbid obesity) ([place])   CKD (chronic kidney disease), stage III [facility])   Discharged Condition: stable  Hospital Course:   Patient presented to [facility] IR on 11/29/2020 for image guided liver lesion biopsy with Dr. Pascal Lux. The procedure occurred without major complications; however, patient developed abdominal pain and underwent CT scan which showed suspected development of intra parenchymal hematoma with associated subcapsular extension following ultrasound-guided liver lesion biopsy. Patient was placed in 24-hour observation with plan of possible discharge today after of the procedure.  Unfortunately, CBC obtained on 11/30/2020 showed drop in hemoglobin (13.5 pre-procedure to 10.2 post-procedure.)  Patient was admitted under IR service for further evaluation of postprocedural intraparenchymal hematoma. Patient was monitored by series of CBC and daily evaluation, and hemoglobin eventually went up to 9.4 on 12/02/2020.   Patient was on 20 mg Xarelto prior to the liver biopsy which was held per IR anticoagulation guideline.  Patient was placed on half-dose Xarelto (10 mg) on 12/02/2020, as hemoglobin stabilized and patient did not have any symptoms and signs of acute bleeding.  After the resumption of Xarelto 10 mg, the hemoglobin again dropped  from 9.4 to 8.1 on 12/03/2020.  Patient remained asymptomatic for acute bleeding.  CBC on 12/04/20 showed hemoglobin of 8.4, stable from the prior CBC. Discussed the hospital course with Dr. Serafina Royals, who stated that patient is medically stable to be discharged, and patient to resume the full dose Xarelto (20 mg) upon discharge.    Patient awake and alert, no complaints at this time.  Patient denies shortness of breath, palpitation, abdominal pain, nausea, vomiting. Plan to discharge home today.   Consults: None  Significant Diagnostic Studies:   Treatments: Ultrasound guided liver lesion biopsy   Discharge Exam: Blood pressure (!) 131/48, pulse 90, temperature 97.8 F (36.6 C), temperature source Axillary, resp. rate 20, height 5\' 3"  (1.6 m), weight 238 lb 1.6 oz (108 kg), SpO2 98 %. Physical Exam  Vitals and nursing note reviewed.  Constitutional:      General: He is not in acute distress.    Appearance: Normal appearance.  HENT:     Head: Normocephalic and atraumatic.     Mouth/Throat:     Mouth: Mucous membranes are moist.     Pharynx: Oropharynx is clear.  Cardiovascular:     Rate and Rhythm: Normal rate and regular rhythm.     Pulses: Normal pulses.     Heart sounds: Normal heart sounds.  Pulmonary:     Effort: Pulmonary effort is normal.     Breath sounds: Normal breath sounds. No wheezing, rhonchi or rales.  Abdominal:     General: Bowel sounds are normal. There is no distension.     Palpations: Abdomen is soft.  Skin:    General: Skin is warm and dry.  Neurological:     Mental Status: He is  alert and oriented to person, place, and time.  Psychiatric:        Mood and Affect: Mood normal.        Behavior: Behavior normal.   Disposition: Discharge disposition: 01-Home or Self Care       Discharge Instructions    Call MD for:   Complete by: As directed    Call MD for:  difficulty breathing, headache or visual disturbances   Complete by: As directed    Call MD for:  extreme fatigue    Complete by: As directed    Call MD for:  hives   Complete by: As directed    Call MD for:  persistant dizziness or light-headedness   Complete by: As directed    Call MD for:  persistant nausea and vomiting   Complete by: As directed    Call MD for:  redness, tenderness, or signs of infection (pain, swelling, redness, odor or green/yellow discharge around incision site)   Complete by: As directed    Call MD for:  severe uncontrolled pain   Complete by: As directed    Call MD for:  temperature >100.4   Complete by: As directed    Diet - low sodium heart healthy   Complete by: As directed    Discharge instructions   Complete by: As directed    No IR follow up needed. Watch for symptoms and signs for acute bleeding such as lightheadedness, fatigues, shortness of breath, rapid heart rate, chest palpitation, right upper quadrant abdominal pain, and bruising.   Discharge wound care:   Complete by: As directed    No further dressing change needed.   Increase activity slowly   Complete by: As directed    No wound care   Complete by: As directed      Allergies as of 12/04/2020      Reactions   Adhesive [tape]    Adhesive tape and band-aids cause skin irritation   Neosporin Original [bacitracin-neomycin-polymyxin] Other (See Comments)   blister   Sulfa Antibiotics Nausea Only, Other (See Comments)   Joint paint   Erythromycin Itching, Rash   burning      Medication List    TAKE these medications   acetaminophen 500 MG tablet Commonly known as: TYLENOL Take 500 mg by mouth every 6 (six) hours as needed (for pain.).   APPLE CIDER VINEGAR PO Take 450 mg by mouth daily.   [place] Saline Nasal Drops 0.65 % Soln Generic drug: Saline Place 1 spray into the nose daily as needed (congestion).   Calcium Carbonate-Vitamin D3 600-400 MG-UNIT Tabs Take 1 tablet by mouth daily.   carvedilol 6.25 MG tablet Commonly known as: COREG Take 6.25 mg by mouth 2 (two) times daily with a meal.    CENTRUM SILVER PO Take 1 tablet by mouth daily.   folic acid 1 MG tablet Commonly known as: FOLVITE Take 1 mg by mouth daily.   furosemide 40 MG tablet Commonly known as: LASIX Take 20 mg by mouth daily as needed for edema.   loperamide 2 MG capsule Commonly known as: IMODIUM Take 2 mg by mouth daily.   magnesium gluconate 500 MG tablet Commonly known as: MAGONATE Take 500 mg by mouth daily as needed (cramps).   methotrexate 2.5 MG tablet Commonly known as: RHEUMATREX Take 20 mg by mouth every Sunday. Caution:Chemotherapy. Protect from light.   NovoLOG Mix 70/30 FlexPen (70-30) 100 UNIT/ML FlexPen Generic drug: insulin aspart protamine - aspart Inject 20-30 Units  into the skin See admin instructions. Take 30 units in the am and Take 20 units in the evening   omeprazole 40 MG capsule Commonly known as: PRILOSEC Take 40 mg by mouth daily.   rivaroxaban 20 MG Tabs tablet Commonly known as: XARELTO Take 1 tablet (20 mg total) by mouth daily with supper.   rOPINIRole 1 MG tablet Commonly known as: REQUIP Take 1 mg by mouth every morning. MAY TAKE 2 ADDITIONAL DOSES  AS NEEDED   vitamin E 180 MG (400 UNITS) capsule Take 400 Units by mouth daily.            Discharge Care Instructions  (From admission, onward)         Start     Ordered   12/04/20 0000  Discharge wound care:       Comments: No further dressing change needed.   12/04/20 0941            Electronically Signed: Tera Mater, PA-C 12/04/2020, 10:33 AM   I have spent Greater Than 30 Minutes discharging Wanda Curry.

## 2020-12-04 NOTE — TOC Transition Note (Signed)
Transition of Care [facility]) - CM/SW Discharge Note   Patient Details  Name: Wanda Curry MRN: [id number] Date of Birth: 18-Jul-1946  [facility] CM/SW Contact:  Benard Halsted, LCSW Phone Number: 12/04/2020, 11:11 AM   Clinical Narrative:    CSW spoke with patient regarding home health services. Patient declined home health and feels comfortable discharging home without services and declined any DME needs. CSW confirmed PCP and address. No other needs identified.    Final next level of care: Home/Self Care Barriers to Discharge: No Barriers Identified   Patient Goals and CMS Choice Patient states their goals for this hospitalization and ongoing recovery are:: Return home   Choice offered to / list presented to : Patient  Discharge Placement                       Discharge Plan and Services   Discharge Planning Services: CM Consult            DME Arranged: N/A         HH Arranged: Refused HH          Social Determinants of Health (SDOH) Interventions     Readmission Risk Interventions No flowsheet data found.

## 2020-12-11 ENCOUNTER — Inpatient Hospital Stay (HOSPITAL_BASED_OUTPATIENT_CLINIC_OR_DEPARTMENT_OTHER): Payer: Medicare Other | Admitting: Hematology

## 2020-12-11 VITALS — BP 130/69 | HR 75 | Temp 97.0°F | Resp 20 | Wt 234.1 lb

## 2020-12-11 DIAGNOSIS — Z86711 Personal history of pulmonary embolism: Secondary | ICD-10-CM | POA: Diagnosis not present

## 2020-12-11 DIAGNOSIS — C220 Liver cell carcinoma: Secondary | ICD-10-CM

## 2020-12-11 DIAGNOSIS — Z87891 Personal history of nicotine dependence: Secondary | ICD-10-CM | POA: Diagnosis not present

## 2020-12-11 DIAGNOSIS — C182 Malignant neoplasm of ascending colon: Secondary | ICD-10-CM | POA: Diagnosis not present

## 2020-12-11 DIAGNOSIS — Z7901 Long term (current) use of anticoagulants: Secondary | ICD-10-CM | POA: Diagnosis not present

## 2020-12-11 DIAGNOSIS — R197 Diarrhea, unspecified: Secondary | ICD-10-CM | POA: Diagnosis not present

## 2020-12-11 NOTE — Progress Notes (Signed)
[place], [place] [address]   CLINIC:  Medical Oncology/Hematology  PCP:  Earney Mallet, MD [address] / [place] [address] [phone]   REASON FOR VISIT:  Follow-up for right colon cancer, hepatocellular carcinoma and iron deficiency state  PRIOR THERAPY:  1. Right hemicolectomy on 10/10/2016. 2. Intermittent Feraheme last on 09/22/2018. 3. Bland embolization of hepatic lesion on 07/25/2019.  NGS Results: Not done  CURRENT THERAPY: Surveillance  BRIEF ONCOLOGIC HISTORY:  Oncology History  Colon cancer ([place])  10/10/2016 Initial Diagnosis   Colon cancer ([place])   10/10/2016 Surgery   Partial colectomy by Dr. Arnoldo Morale  Pathology shows  a 2.1 cm grade 2 adenocarcinoma of the ascending colon with invasion through the muscularis propria into the peri-colorectal tissues.      11/17/2016 PET scan   The hepatic lesion seen on the CT scan and MRI does not demonstrate hypermetabolism and is likely a benign entity.  Right maxillary sinus disease with associated hypermetabolism.  Scattered pulmonary nodules, likely benign. No follow-up needed if patient is low-risk (and has no known or suspected primary neoplasm). Non-contrast chest CT can be considered in 12 months if patient is high-risk.    12/25/2016 - 02/04/2017 Adjuvant Chemotherapy   Xeloda 2000mg  PO BID take for 14 days out of 21 days. Plan for total of 24 weeks of treatment.     CANCER STAGING: Cancer Staging Colon cancer [facility]) Staging form: Colon and Rectum, AJCC 8th Edition - Clinical: Stage IIA (cT3, cN0, cM0) - Signed by [name], MD on 11/26/2016   INTERVAL HISTORY:  Wanda Curry, a 75 y.o. female, returns for routine follow-up of her right colon cancer, hepatocellular carcinoma and iron deficiency state. Kerianna was last seen on 11/21/2020.   Today she is accompanied by her daughter and she reports feeling okay. She complains of having no energy. She  reports that when she had her liver biopsy, she started bleeding, passed out and ended up in the hospital for 5 days. She stil reports having upper abdominal soreness when she reclines. She is taking Xarelto daily and reports having easy bruising on her arms, but denies SOB or dyspnea. She reports that she stopped Xarelto on 04/12 for her 04/14 procedure. She reports that when she took Xeloda in 2018, she developed blisters on her arms which required 2 years to fully heal.   REVIEW OF SYSTEMS:  Review of Systems  Constitutional: Positive for fatigue (25%). Negative for appetite change.  Respiratory: Negative for shortness of breath.   Gastrointestinal: Positive for abdominal pain (5/10 R abdominal pain) and diarrhea.  Neurological: Positive for dizziness.  Hematological: Bruises/bleeds easily (easy bruising d/t Xarelto).  Psychiatric/Behavioral: Positive for sleep disturbance.  All other systems reviewed and are negative.   PAST MEDICAL/SURGICAL HISTORY:  Past Medical History:  Diagnosis Date  . Anemia   . Arthritis    RA  . Cancer ([place])    skin cancer  . Depression   . Diabetes mellitus without complication ([place])   . Fibromyalgia   . GERD (gastroesophageal reflux disease)   . Heart murmur    ECHO scheduled 05-25-2015  . Hyperlipidemia   . Neuropathy   . PONV (postoperative nausea and vomiting)    Past Surgical History:  Procedure Laterality Date  . ABDOMINAL HYSTERECTOMY    . BIOPSY  09/23/2016   Procedure: BIOPSY;  Surgeon: Danie Binder, MD;  [facility];  Service: Endoscopy;;  hepatic flexure mass  .  CHOLECYSTECTOMY    . COLONOSCOPY N/A 08/13/2017   Procedure: COLONOSCOPY;  Surgeon: Daneil Dolin, MD;  [facility];  Service: Endoscopy;  Laterality: N/A;  . COLONOSCOPY WITH PROPOFOL N/A 09/23/2016   Procedure: COLONOSCOPY WITH PROPOFOL;  Surgeon: Danie Binder, MD;  [facility];  Service: Endoscopy;  Laterality: N/A;  7:30 am  . HARDWARE  REMOVAL Right 05/30/2015   Procedure: HARDWARE REMOVAL;  Surgeon: Ninetta Lights, MD;  Location: [facility];  Service: Orthopedics;  Laterality: Right;  . IR ANGIOGRAM SELECTIVE EACH ADDITIONAL VESSEL  07/25/2019  . IR ANGIOGRAM SELECTIVE EACH ADDITIONAL VESSEL  07/25/2019  . IR ANGIOGRAM VISCERAL SELECTIVE  07/25/2019  . IR ANGIOGRAM VISCERAL SELECTIVE  07/25/2019  . IR EMBO TUMOR ORGAN ISCHEMIA INFARCT INC GUIDE ROADMAPPING  07/25/2019  . IR RADIOLOGIST EVAL & MGMT  06/09/2019  . IR RADIOLOGIST EVAL & MGMT  07/07/2019  . IR RADIOLOGIST EVAL & MGMT  01/05/2020  . IR RADIOLOGIST EVAL & MGMT  04/12/2020  . IR RADIOLOGIST EVAL & MGMT  09/27/2020  . IR RADIOLOGIST EVAL & MGMT  10/31/2020  . IR US GUIDE VASC ACCESS RIGHT  07/25/2019  . PARTIAL COLECTOMY N/A 10/10/2016   Procedure: PARTIAL COLECTOMY;  Surgeon: Aviva Signs, MD;  Location: [facility];  Service: General;  Laterality: N/A;  . planter warts Bilateral    both feet  . POLYPECTOMY  09/23/2016   Procedure: POLYPECTOMY;  Surgeon: Danie Binder, MD;  [facility];  Service: Endoscopy;;  transverse colon polyps times 2, rectal polyp  . skin grafts     due to planter warts  . TOTAL KNEE ARTHROPLASTY Right 05/30/2015   Procedure: RIGHT TOTAL KNEE ARTHROPLASTY;  Surgeon: Ninetta Lights, MD;  Location: [facility];  Service: Orthopedics;  Laterality: Right;    SOCIAL HISTORY:  Social History   Socioeconomic History  . Marital status: Widowed    Spouse name: Not on file  . Number of children: Not on file  . Years of education: Not on file  . Highest education level: Not on file  Occupational History  . Not on file  Tobacco Use  . Smoking status: Former Smoker    Packs/day: 1.00    Years: 40.00    Pack years: 40.00    Types: Cigarettes    Quit date: 08/18/2004    Years since quitting: 16.3  . Smokeless tobacco: Never Used  Vaping Use  . Vaping Use: Never used  Substance and Sexual Activity  . Alcohol use: No  . Drug use: No  . Sexual  activity: Not Currently    Birth control/protection: Surgical  Other Topics Concern  . Not on file  Social History Narrative  . Not on file   Social Determinants of Health   Financial Resource Strain: Not on file  Food Insecurity: Not on file  Transportation Needs: Not on file  Physical Activity: Not on file  Stress: Not on file  Social Connections: Not on file  Intimate Partner Violence: Not on file    FAMILY HISTORY:  Family History  Problem Relation Age of Onset  . Colon cancer Neg Hx     CURRENT MEDICATIONS:  Current Outpatient Medications  Medication Sig Dispense Refill  . acetaminophen (TYLENOL) 500 MG tablet Take 500 mg by mouth every 6 (six) hours as needed (for pain.).    Marland Kitchen APPLE CIDER VINEGAR PO Take 450 mg by mouth daily.    . Calcium Carbonate-Vitamin D3 600-400  MG-UNIT TABS Take 1 tablet by mouth daily.    . carvedilol (COREG) 6.25 MG tablet Take 6.25 mg by mouth 2 (two) times daily with a meal.    . folic acid (FOLVITE) 1 MG tablet Take 1 mg by mouth daily.    . furosemide (LASIX) 40 MG tablet Take 20 mg by mouth daily as needed for edema.    Marland Kitchen loperamide (IMODIUM) 2 MG capsule Take 2 mg by mouth daily.    . magnesium gluconate (MAGONATE) 500 MG tablet Take 500 mg by mouth daily as needed (cramps).    . methotrexate (RHEUMATREX) 2.5 MG tablet Take 20 mg by mouth every Sunday. Caution:Chemotherapy. Protect from light.    . Multiple Vitamins-Minerals (CENTRUM SILVER PO) Take 1 tablet by mouth daily.    Marland Kitchen NOVOLOG MIX 70/30 FLEXPEN (70-30) 100 UNIT/ML FlexPen Inject 20-30 Units into the skin See admin instructions. Take 30 units in the am and Take 20 units in the evening  3  . omeprazole (PRILOSEC) 40 MG capsule Take 40 mg by mouth daily.   3  . rivaroxaban (XARELTO) 20 MG TABS tablet Take 1 tablet (20 mg total) by mouth daily with supper. 30 tablet 6  . rOPINIRole (REQUIP) 1 MG tablet Take 1 mg by mouth every morning. MAY TAKE 2 ADDITIONAL DOSES  AS NEEDED    .  Saline (AYR SALINE NASAL DROPS) 0.65 % SOLN Place 1 spray into the nose daily as needed (congestion).    . vitamin E 180 MG (400 UNITS) capsule Take 400 Units by mouth daily.     No current facility-administered medications for this visit.   Facility-Administered Medications Ordered in Other Visits  Medication Dose Route Frequency Provider Last Rate Last Admin  . 0.9 %  sodium chloride infusion   Intravenous Continuous Holley Bouche, NP   Stopped at 09/14/17 1440    ALLERGIES:  Allergies  Allergen Reactions  . Xeloda [Capecitabine] Other (See Comments)    Blisters and pain to skin to include feet and arms  . Adhesive [Tape]     Adhesive tape and band-aids cause skin irritation  . Neosporin Original [Bacitracin-Neomycin-Polymyxin] Other (See Comments)    blister  . Sulfa Antibiotics Nausea Only and Other (See Comments)    Joint paint  . Erythromycin Itching and Rash    burning    PHYSICAL EXAM:  Performance status (ECOG): 2 - Symptomatic, <50% confined to bed  Vitals:   12/11/20 1350  BP: 130/69  Pulse: 75  Resp: 20  Temp: (!) 97 F (36.1 C)  SpO2: 95%   Wt Readings from Last 3 Encounters:  12/11/20 234 lb 1.6 oz (106.2 kg)  12/02/20 238 lb 1.6 oz (108 kg)  11/21/20 227 lb 12.8 oz (103.3 kg)   Physical Exam Vitals reviewed.  Constitutional:      Appearance: Normal appearance. She is obese.     Comments: In wheelchair  Cardiovascular:     Rate and Rhythm: Normal rate and regular rhythm.     Pulses: Normal pulses.     Heart sounds: Normal heart sounds.  Pulmonary:     Effort: Pulmonary effort is normal.     Breath sounds: Normal breath sounds.  Abdominal:     Palpations: Abdomen is soft. There is no mass.     Tenderness: There is no abdominal tenderness.  Neurological:     General: No focal deficit present.     Mental Status: She is alert and oriented to person, place, and  time.  Psychiatric:        Mood and Affect: Mood normal.        Behavior:  Behavior normal.      LABORATORY DATA:  I have reviewed the labs as listed.  CBC Latest Ref Rng & Units 12/04/2020 12/04/2020 12/03/2020  WBC 4.0 - 10.5 K/uL 6.6 6.8 7.6  Hemoglobin 12.0 - 15.0 g/dL 8.4(L) 8.4(L) 9.3(L)  Hematocrit 36.0 - 46.0 % 26.1(L) 25.9(L) 28.9(L)  Platelets 150 - 400 K/uL 173 162 190   CMP Latest Ref Rng & Units 12/04/2020 12/03/2020 12/02/2020  Glucose 70 - 99 mg/dL 162(H) 225(H) 175(H)  BUN 8 - 23 mg/dL 14 17 19   Creatinine 0.44 - 1.00 mg/dL 0.99 1.30(H) 1.06(H)  Sodium 135 - 145 mmol/L 135 138 138  Potassium 3.5 - 5.1 mmol/L 4.1 4.3 4.2  Chloride 98 - 111 mmol/L 104 107 108  CO2 22 - 32 mmol/L 25 23 25   Calcium 8.9 - 10.3 mg/dL 8.3(L) 8.5(L) 8.1(L)  Total Protein 6.5 - 8.1 g/dL 5.1(L) 5.1(L) 5.1(L)  Total Bilirubin 0.3 - 1.2 mg/dL 0.8 0.7 0.5  Alkaline Phos 38 - 126 U/L 37(L) 33(L) 33(L)  AST 15 - 41 U/L 40 27 35  ALT 0 - 44 U/L 45(H) 39 52(H)   Lab Results  Component Value Date   CEA1 4.1 11/21/2020   CEA1 4.5 03/29/2019   CEA1 4.7 12/20/2018   Surgical pathology ([id number]) on 11/29/2020: Left lobe liver needle core biopsy: well to moderately differentiated hepatocellular carcinoma.  DIAGNOSTIC IMAGING:  I have independently reviewed the scans and discussed with the patient. CT ABDOMEN PELVIS WO CONTRAST  Result Date: 11/30/2020 CLINICAL DATA:  Known history of colorectal cancer and hepatocellular carcinoma, now with acute onset of abdominal pain following liver lesion biopsy. EXAM: CT ABDOMEN AND PELVIS WITHOUT CONTRAST TECHNIQUE: Multidetector CT imaging of the abdomen and pelvis was performed following the standard protocol without IV contrast. COMPARISON:  CT abdomen and pelvis-10/25/2020; 09/16/2018 FINDINGS: The lack of intravenous contrast limits the ability to evaluate solid abdominal organs. Lower chest: Limited visualization of the lower thorax demonstrates minimal dependent subpleural ground-glass atelectasis. Unchanged punctate (0.6 cm)  sub pleural nodule within the right middle lobe is unchanged since at least the 08/2018 examination and thus of benign etiology. No discrete focal airspace opacities. No pleural effusion. Normal heart size. No pericardial effusion. Calcifications of the mitral valve annulus. Hepatobiliary: Normal hepatic contour. Known infiltrative mass within the central aspect of the liver appears grossly unchanged. There is a minimal amount intraparenchymal air along the tract from the biopsy adjacent to the ill-defined mixed attenuating lesion within the left lobe of the liver (image 19 and 20, series 3). Interval development of an adjacent area of hypo attenuation within the subcapsular aspect of the lateral segment of the left lobe of the liver (image 17, series 3 with associated adjacent subcapsular hypoattenuating fluid which demonstrates Hounsfield units suggestive of hemorrhage (image 18, series 3. No definitive intraparenchymal extension of suspected subcapsular hemorrhage though note is made of a small amount perisplenic fluid. Post cholecystectomy. Pancreas: Normal noncontrast appearance of the pancreas. Spleen: Note is made of a small amount of perisplenic fluid which demonstrates Hounsfield units of 22 (image 20, series 3) Adrenals/Urinary Tract: Mild atrophy of the bilateral kidneys. No renal stones. There is a minimal amount of likely age and body habitus related perinephric stranding. No urine obstruction Normal noncontrast appearance the bilateral adrenal glands. Normal appearance of the urinary bladder given underdistention. Stomach/Bowel: Moderate  to large colonic stool burden without evidence of enteric obstruction. Scattered colonic diverticulosis without evidence of superimposed acute diverticulitis. Stable sequela of right hemicolectomy without evidence of enteric obstruction. No discrete areas of bowel wall thickening. Small hiatal hernia. No pneumoperitoneum, pneumatosis or portal venous gas.  Vascular/Lymphatic: Extensive atherosclerotic plaque within normal caliber abdominal aorta. No bulky retroperitoneal, mesenteric, pelvic or inguinal lymphadenopathy on this noncontrast examination. Reproductive: Post hysterectomy. No discrete adnexal lesions. There is a trace amount of fluid in the pelvic cul-de-sac. Other: Somewhat nodular foci of subcutaneous stranding within the abdominal pannus likely represents the location of subcutaneous medication administration. Mild diffuse body wall anasarca. Musculoskeletal: No definite acute or aggressive osseous abnormalities. Moderate multilevel lumbar spine DDD is suspected though incompletely evaluated. IMPRESSION: 1. Suspected development of intra parenchymal hematoma with associated subcapsular extension following ultrasound-guided liver lesion biopsy. No definite intra parenchymal extension however note is made of a small amount of perisplenic fluid. 2. Similar appearance of known infiltrative mass with the central aspects of the liver. 3. Stable sequela of right hemicolectomy without evidence of enteric obstruction. 4. Aortic Atherosclerosis (ICD10-I70.0). PLAN: Given above, patient subsequently underwent multiphase contrast-enhanced CT. Electronically Signed   By: Sandi Mariscal M.D.   On: 11/30/2020 09:11   US BIOPSY (LIVER)  Result Date: 11/29/2020 INDICATION: History of hepatocellular carcinoma as well as colon cancer, now with indeterminate liver lesions. Please perform ultrasound-guided liver lesion biopsy for tissue diagnostic purposes. EXAM: ULTRASOUND GUIDED LIVER LESION BIOPSY COMPARISON:  CT abdomen pelvis-10/26/2020; abdominal MRI-09/21/2020 MEDICATIONS: None ANESTHESIA/SEDATION: Fentanyl 50 mcg IV; Versed 1 mg IV Total Moderate Sedation time:  15 Minutes. The patient's level of consciousness and vital signs were monitored continuously by radiology nursing throughout the procedure under my direct supervision. COMPLICATIONS: None immediate.  PROCEDURE: Informed written consent was obtained from the patient after a discussion of the risks, benefits and alternatives to treatment. The patient understands and consents the procedure. A timeout was performed prior to the initiation of the procedure. Ultrasound scanning was performed of the right upper abdominal quadrant demonstrates an approximately 1.5 x 1.4 cm hypoechoic lesion within the left lobe of the liver correlating with the hypervascular lesion seen on preceding abdominal CT image 28, series 3. The procedure was planned. The midline of the upper abdomen was prepped and draped in the usual sterile fashion. The overlying soft tissues were anesthetized with 1% lidocaine with epinephrine. A 17 gauge, 6.8 cm co-axial needle was advanced into a peripheral aspect of the lesion. This was followed by 4 core biopsies with an 18 gauge core device under direct ultrasound guidance. The coaxial needle tract was embolized with a small amount of Gel-Foam slurry and superficial hemostasis was obtained with manual compression. Post procedural scanning was negative for definitive area of hemorrhage or additional complication. A dressing was placed. The patient tolerated the procedure well without immediate post procedural complication. IMPRESSION: Technically successful ultrasound guided core needle biopsy of indeterminate lesion within the left lobe of the liver. Electronically Signed   By: Sandi Mariscal M.D.   On: 11/29/2020 14:26   VAS [place] LOWER EXTREMITY VENOUS (DVT)  Result Date: 12/02/2020  Lower Venous DVT Study Indications: Pulmonary embolism 10/25/20.  Risk Factors: Cancer newly diagnosed hepatocellular carcinoma 11/29/20. Limitations: Body habitus. Comparison Study: No prior study Performing Technologist: Sharion Dove RVS  Examination Guidelines: A complete evaluation includes B-mode imaging, spectral Doppler, color Doppler, and power Doppler as needed of all accessible portions of each vessel. Bilateral  testing is considered an integral  part of a complete examination. Limited examinations for reoccurring indications may be performed as noted. The reflux portion of the exam is performed with the patient in reverse Trendelenburg.  +---------+---------------+---------+-----------+----------+-------------------+ RIGHT    CompressibilityPhasicitySpontaneityPropertiesThrombus Aging      +---------+---------------+---------+-----------+----------+-------------------+ CFV      Partial        No       No                   Age Indeterminate   +---------+---------------+---------+-----------+----------+-------------------+ SFJ      Partial                                      Age Indeterminate   +---------+---------------+---------+-----------+----------+-------------------+ FV Prox  Full                                                             +---------+---------------+---------+-----------+----------+-------------------+ FV Mid   Full                                                             +---------+---------------+---------+-----------+----------+-------------------+ FV DistalFull                                                             +---------+---------------+---------+-----------+----------+-------------------+ PFV      Full                                                             +---------+---------------+---------+-----------+----------+-------------------+ POP      Full           Yes      Yes                                      +---------+---------------+---------+-----------+----------+-------------------+ PTV      Full                                                             +---------+---------------+---------+-----------+----------+-------------------+ PERO                                                  Not well visualized +---------+---------------+---------+-----------+----------+-------------------+    +---------+---------------+---------+-----------+----------+-------------------+ LEFT     CompressibilityPhasicitySpontaneityPropertiesThrombus Aging      +---------+---------------+---------+-----------+----------+-------------------+  CFV      Full           Yes      Yes                                      +---------+---------------+---------+-----------+----------+-------------------+ SFJ      Full                                                             +---------+---------------+---------+-----------+----------+-------------------+ FV Prox  Full                                                             +---------+---------------+---------+-----------+----------+-------------------+ FV Mid   Full                                                             +---------+---------------+---------+-----------+----------+-------------------+ FV DistalFull                                                             +---------+---------------+---------+-----------+----------+-------------------+ PFV      Full                                                             +---------+---------------+---------+-----------+----------+-------------------+ POP      Full           Yes      Yes                                      +---------+---------------+---------+-----------+----------+-------------------+ PTV      Full                                                             +---------+---------------+---------+-----------+----------+-------------------+ PERO                                                  Not well visualized +---------+---------------+---------+-----------+----------+-------------------+    Summary: RIGHT: - Findings consistent with age indeterminate deep vein thrombosis involving the right common femoral vein, and SF junction.  LEFT: - There is no evidence of deep vein thrombosis in the lower extremity. However, portions of this  examination were limited- see technologist comments above.  *See table(s) above for measurements and observations. Electronically signed by Servando Snare MD on 12/02/2020 at 9:00:54 AM.    Final    CT Angio Abd/Pel w/ and/or w/o  Result Date: 11/30/2020 CLINICAL DATA:  Concern for intraparenchymal hemorrhage with subcapsular extension preceding noncontrast CT following ultrasound-guided liver lesion biopsy. EXAM: CTA ABDOMEN AND PELVIS WITHOUT AND WITH CONTRAST TECHNIQUE: Multidetector CT imaging of the abdomen and pelvis was performed using the standard protocol during bolus administration of intravenous contrast. Multiplanar reconstructed images and MIPs were obtained and reviewed to evaluate the vascular anatomy. CONTRAST:  170mL OMNIPAQUE IOHEXOL 350 MG/ML SOLN COMPARISON:  Noncontrast CT scan of the abdomen and pelvis-earlier same day; CT abdomen pelvis-10/25/2020; 03/29/2019; chest CT-06/23/2017 FINDINGS: VASCULAR Aorta: Moderate to large amount of eccentric calcified atherosclerotic plaque throughout the normal caliber abdominal aorta. Calcified atherosclerotic plaque at the origin of the takeoff of the accessory left renal artery results in approximately 50% luminal narrowing (image 63, series 5). No evidence of abdominal aortic dissection or periaortic stranding. Celiac: Redemonstrated separate origins of the common hepatic and splenic arteries in lieu of a conventional celiac trunk. There is a minimal amount of eccentric noncalcified atherosclerotic plaque involving the origin of the takeoff of the common hepatic artery, not resulting in hemodynamically significant stenosis. The splenic artery is widely patent without hemodynamically significant stenosis. SMA: There is a minimal amount of eccentric calcified atherosclerotic plaque involving the origin and main trunk of the SMA, not resulting in hemodynamically significant stenosis. Conventional branching pattern. The distal tributaries the SMA appear  widely patent without discrete lumen filling defect. Renals: Note is made of 3 separate left-sided renal arteries there is a moderate amount of eccentric predominantly calcified atherosclerotic plaque involving the origin of the dominant left renal artery not definitely resulting in hemodynamically significant stenosis. The solitary right renal artery is widely patent without hemodynamically significant narrowing. No vessel irregularity to suggest FMD. IMA: Disease at its origin though remains patent. 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 The bilateral common iliac arteries are disease though without a definitive hemodynamically significant stenosis. The bilateral internal iliac arteries are disease though patent and of normal caliber. Bilateral external iliac arteries are mildly disease though patent and of caliber. Proximal Outflow: There is a minimal amount of calcified atherosclerotic plaque involving the bilateral common femoral arteries, not resulting in hemodynamically significant stenosis. The imaged portions of the bilateral deep and superficial femoral arteries are of caliber and widely patent without hemodynamically significant narrowing. Veins: The IVC appears widely patent as does the portal venous system. Review of the MIP images confirms the above findings. _________________________________________________________ NON-VASCULAR Lower chest: Limited visualization of lower thorax demonstrates minimal dependent subpleural ground-glass atelectasis. The approximately 0.7 cm subpleural nodule within the right middle lobe (image 22, series 9), is unchanged since at least the 06/2017 examination and thus a benign etiology. No pleural effusion or pneumothorax. Normal heart size. No pericardial effusion. Hepatobiliary: Normal hepatic contour. Redemonstrated mixed attenuating mass within the central aspect of  the liver measuring approximately 3.9 x 3.3 cm (image 19, series 6). Redemonstrated approximately 1.3 x 1.0 cm hypoattenuating lesion within the left lobe of the liver with adjacent Gel-Foam embolization material (representative images 18 and 19, series 6). Interval development of an adjacent  intraparenchymal hematomas with medial component measuring approximately 2.2 x 1.3 cm (image 22, series 6) and dominant component within the more lateral aspect of the lateral segment of left lobe of the liver measuring approximately 2.8 x 2.1 cm (image 17, series 6) and appears to extend to the liver capsule (image 19, series 6) with associated adjacent subcapsular hematoma. This finding is associated with minimal pooling of contrast within the central aspect of the hematoma (image 17, series 6). No definitive intra part parenchymal extension however note is made of a small amount of perisplenic fluid. Pancreas: Normal appearance of the pancreas. Spleen: Note is made of a small amount of perisplenic fluid. Adrenals/Urinary Tract: There is symmetric enhancement of the bilateral kidneys. No evidence of nephrolithiasis on this postcontrast examination. No discrete renal lesions. There is a minimal amount of likely age and body habitus related bilateral perinephric stranding. No urine obstruction. Normal appearance of the bilateral adrenal glands. The urinary bladder is underdistended. Stomach/Bowel: Colonic diverticulosis without evidence of superimposed acute diverticulitis. Moderate colonic stool burden. Sequela right hemicolectomy without evidence of enteric obstruction. Small hiatal hernia. No pneumoperitoneum, pneumatosis or portal venous gas. Lymphatic: No bulky retroperitoneal, mesenteric, pelvic or inguinal lymphadenopathy. Reproductive: Post hysterectomy. No discrete adnexal lesions. Trace amount of fluid within the pelvic cul-de-sac. Other: Nodular thickening involving the subcutaneous tissues of the undersurface of the  pannus, likely at the location of subcutaneous medication administration. Minimal amount of subcutaneous edema about midline of the low back. Musculoskeletal: No acute or aggressive osseous abnormalities. Stigmata of dish within the thoracic spine. Posteriorly directed disc osteophyte complex at T12-L1. IMPRESSION: Vascular Impression: 1. Post ultrasound-guided left sided liver lesion biopsy complicated by development of intraparenchymal hematomas. There is pooling of contrast within the dominant more laterally located hematoma with apparent extension of the hematoma to the liver capsule edge with associated adjacent subcapsular hemorrhage. 2. Small amount of perisplenic fluid. Otherwise, no definitive intraparenchymal extension. 3. Large amount of atherosclerotic plaque including a suspected hemodynamically significant stenosis within the infrarenal abdominal aorta. Aortic Atherosclerosis (ICD10-I70.0). 4. 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 5. Non conventional arterial anatomy including separate origins of the common hepatic and splenic arteries in lieu of a conventional celiac trunk as detailed above. Nonvascular Impression: 1. Image grossly unchanged appearance of known dominant infiltrative mass within the central aspect of the liver, compatible with biopsy-proven hepatocellular carcinoma. 2. Sequela of right hemicolectomy without evidence of enteric obstruction. 3. Colonic diverticulosis without evidence of superimposed acute diverticulitis. 4. Small hiatal hernia. PLAN: Given above findings the decision was made to admit the patient for observation as she fortunately remained hemodynamically stable with subjective improvement in her transiently worsening abdominal pain. We will monitor the patient's H and H and keep the patient NPO in case arteriogram and embolization is ultimately required. Electronically Signed   By: Sandi Mariscal  M.D.   On: 11/30/2020 09:47     ASSESSMENT:  1. Hepatocellular carcinoma: -MRI of the abdomen with Eovist on 04/21/2019 showed hypoenhancing lesion in the liver, which was not typical for [facility]. It was not significantly hypermetabolic on prior PET/CT. This lesion has been gradually increasing since 10/08/2016. -AFP was normal at 3.9. -Biopsy on 05/11/2019 shows well to moderately differentiated hepatocellular carcinoma, steatohepatitic variant. -CT chest on 05/30/2019 was negative for metastatic disease. -Bland embolization of the hepatic lesion on 07/25/2019. -MRI of the abdomen with and without contrast on 09/21/2020 showed previous treated lesion  in the central aspect of the liver measures 4.4 x 3.2 cm, previously 3.1 x 2.6 cm.  Hypervascular left liver lobe lesion measuring 1.3 x 1.1 cm.  6 mm hypervascular focus noted in the inferior aspect of the right lobe of the liver. -CTAP with and without contrast on 10/25/2020 showed left liver lesion measuring 1.8 x 1.4 cm, for inferior right liver lesion measuring 1 x 0.9 cm which are new.  Recurrent hyperenhancing tumor at the treated tumor site in the central liver, measuring 6 x 5 cm with extension into the caudate lobe.  No evidence of metastatic disease in the chest. - Left lobe liver biopsy on 11/29/2020-well to moderately differentiated HCC.  2. Stage IIa (CT3CN0) colon cancer: -Right hemicolectomy on 10/10/2016, grade 2 adenocarcinoma, positive LVSI, margins negative, 0/19 lymph nodes positive. -Because of positive LVSI, she was recommended Xeloda. She could not tolerate more than 2 cycles.  She developed severe hand-foot skin reaction. -PET scan on 02/23/2018- for metastatic disease. Liver mass was also not seen. -CEA on 03/29/2019 was 4.5. -CT AP on 03/29/2019 showed mass of the liver measuring 5.2 cm, previously 4.8 cm.  3. Diarrhea: -She has watery diarrhea up to once per day. She takes Imodium 1-2 tablets/day which helps. She had this  diarrhea since surgery.  4. Unprovoked PE: -Incidental PE on CT chest in August 2018. She was treated with Xarelto 20 mg for a year. -Dose was reduced to 10 mg daily in October 2019. She is tolerating it without any problems.   PLAN:  1. Hepatocellular carcinoma: -We talked about the liver biopsy results from 11/29/2020. - This showed well to moderately differentiated [place]. - I have recommended her to talk to Dr. Pascal Lux about locoregional therapy.  As per my previous discussion with him, Y 90 treatment is a possible option. - If she is not a candidate for local regional therapy, will talk to her about systemic therapy options including TKI's and immunotherapy.  2. Stage IIa (CT3CN0) colon cancer: -No change in bowel habits.  No bleeding per rectum.  Last CEA was within normal limits. - CTAP on 10/25/2020 did not show any evidence of recurrence. - RTC 4 months with repeat CEA, and routine labs.  3. Unprovoked PE: -CTAP on 10/25/2020 showed incidental acute right lower lobe pulmonary embolism. - She has been taking Xarelto 20 mg daily since then without any bleeding issues.  4.  Diarrhea: -She has diarrhea since her colon cancer surgery.  Continue Imodium as needed.   Orders placed this encounter:  Orders Placed This Encounter  Procedures  . CEA  . AFP tumor marker  . CBC with Differential/Platelet  . Comprehensive metabolic panel     Derek Jack, MD [place] [phone]   I, Milinda Antis, am acting as a scribe for Dr. Sanda Linger.  I, Derek Jack MD, have reviewed the above documentation for accuracy and completeness, and I agree with the above.

## 2020-12-11 NOTE — Patient Instructions (Signed)
[place] at [facility] Discharge Instructions  You were seen today by Dr. Delton Coombes. He went over your recent results. Make an appointment with Dr. Pascal Lux to discuss liver embolization with Y-90. Dr. Delton Coombes will see you back in 4 months for labs and follow up.   Thank you for choosing [place] at [facility] to provide your oncology and hematology care.  To afford each patient quality time with our provider, please arrive at least 15 minutes before your scheduled appointment time.   If you have a lab appointment with the [facility] please come in thru the Main Entrance and check in at the main information desk  You need to re-schedule your appointment should you arrive 10 or more minutes late.  We strive to give you quality time with our providers, and arriving late affects you and other patients whose appointments are after yours.  Also, if you no show three or more times for appointments you may be dismissed from the clinic at the providers discretion.     Again, thank you for choosing [facility].  Our hope is that these requests will decrease the amount of time that you wait before being seen by our physicians.       _____________________________________________________________  Should you have questions after your visit to [facility], please contact our office at [phone] between the hours of 8:00 a.m. and 4:30 p.m.  Voicemails left after 4:00 p.m. will not be returned until the following business day.  For prescription refill requests, have your pharmacy contact our office and allow 72 hours.    Cancer Center Support Programs:   > Cancer Support Group  2nd Tuesday of the month 1pm-2pm, Journey Room

## 2020-12-12 ENCOUNTER — Other Ambulatory Visit: Payer: Self-pay | Admitting: Interventional Radiology

## 2020-12-12 ENCOUNTER — Ambulatory Visit
Admission: RE | Admit: 2020-12-12 | Discharge: 2020-12-12 | Disposition: A | Discharge status: Home / Self Care | Payer: Medicare Other | Source: Ambulatory Visit | Attending: Interventional Radiology | Admitting: Interventional Radiology

## 2020-12-12 ENCOUNTER — Encounter: Payer: Self-pay | Admitting: *Deleted

## 2020-12-12 ENCOUNTER — Other Ambulatory Visit: Payer: Self-pay

## 2020-12-12 DIAGNOSIS — C220 Liver cell carcinoma: Secondary | ICD-10-CM

## 2020-12-12 HISTORY — PX: IR RADIOLOGIST EVAL & MGMT: IMG5224

## 2020-12-12 NOTE — Progress Notes (Signed)
Patient ID: Wanda Curry, female   DOB: [birth year], 75 y.o.   MRN: [id number]         Chief Complaint: [facility]  Referring Physician(s): Delton Coombes  History of Present Illness: Wanda Curry is a 75 y.o. female with past medical history significant for colon cancer, rheumatoid arthritis, diabetes and hyperlipidemia who was found to have an indeterminate slowly growing lesion within the central aspect of the liver for which he ultimately underwent ultrasound-guided liver biopsy on 05/11/2019 with pathologic diagnosis of well to moderately differentiated hepatocellular carcinoma,ultimately undergoinga blandhepatic embolizationon12/02/2020.  The patient and the patient's daughter are seen today in telemedicine consultation following ultrasound-guided biopsy hypervascular lesion within the lateral segment of the left lobe of the liver performed [id number] complicated by development intraparenchymal and ultimately subcapsular hematoma requiring a prolonged hospitalization for observation though ultimately not requiring dedicated intervention (such as arteriogram and embolization).  The patient continues complain of mild right upper quadrant abdominal pain and soreness though states this has recently improved.  The patient continues to report fatigue but was otherwise without complaint.  Specifically, no chest pain or shortness of breath.No unintentional weight loss or weight gain. No yellowing of the skin or eyes.  Past Medical History:  Diagnosis Date  . Anemia   . Arthritis    RA  . Cancer ([place])    skin cancer  . Depression   . Diabetes mellitus without complication ([place])   . Fibromyalgia   . GERD (gastroesophageal reflux disease)   . Heart murmur    ECHO scheduled 05-25-2015  . Hyperlipidemia   . Neuropathy   . PONV (postoperative nausea and vomiting)     Past Surgical History:  Procedure Laterality Date  . ABDOMINAL HYSTERECTOMY    . BIOPSY  09/23/2016   Procedure: BIOPSY;   Surgeon: Danie Binder, MD;  [facility];  Service: Endoscopy;;  hepatic flexure mass  . CHOLECYSTECTOMY    . COLONOSCOPY N/A 08/13/2017   Procedure: COLONOSCOPY;  Surgeon: Daneil Dolin, MD;  [facility]: [facility];  Service: Endoscopy;  Laterality: N/A;  . COLONOSCOPY WITH PROPOFOL N/A 09/23/2016   Procedure: COLONOSCOPY WITH PROPOFOL;  Surgeon: Danie Binder, MD;  [facility];  Service: Endoscopy;  Laterality: N/A;  7:30 am  . HARDWARE REMOVAL Right 05/30/2015   Procedure: HARDWARE REMOVAL;  Surgeon: Ninetta Lights, MD;  Location: [place];  Service: Orthopedics;  Laterality: Right;  . IR ANGIOGRAM SELECTIVE EACH ADDITIONAL VESSEL  07/25/2019  . IR ANGIOGRAM SELECTIVE EACH ADDITIONAL VESSEL  07/25/2019  . IR ANGIOGRAM VISCERAL SELECTIVE  07/25/2019  . IR ANGIOGRAM VISCERAL SELECTIVE  07/25/2019  . IR EMBO TUMOR ORGAN ISCHEMIA INFARCT INC GUIDE ROADMAPPING  07/25/2019  . IR RADIOLOGIST EVAL & MGMT  06/09/2019  . IR RADIOLOGIST EVAL & MGMT  07/07/2019  . IR RADIOLOGIST EVAL & MGMT  01/05/2020  . IR RADIOLOGIST EVAL & MGMT  04/12/2020  . IR RADIOLOGIST EVAL & MGMT  09/27/2020  . IR RADIOLOGIST EVAL & MGMT  10/31/2020  . IR US GUIDE VASC ACCESS RIGHT  07/25/2019  . PARTIAL COLECTOMY N/A 10/10/2016   Procedure: PARTIAL COLECTOMY;  Surgeon: Aviva Signs, MD;  Location: [facility];  Service: General;  Laterality: N/A;  . planter warts Bilateral    both feet  . POLYPECTOMY  09/23/2016   Procedure: POLYPECTOMY;  Surgeon: Danie Binder, MD;  [facility];  Service: Endoscopy;;  transverse colon polyps times 2, rectal polyp  . skin grafts  due to planter warts  . TOTAL KNEE ARTHROPLASTY Right 05/30/2015   Procedure: RIGHT TOTAL KNEE ARTHROPLASTY;  Surgeon: Ninetta Lights, MD;  Location: [place];  Service: Orthopedics;  Laterality: Right;    Allergies: Xeloda [capecitabine], Adhesive [tape], Neosporin original [bacitracin-neomycin-polymyxin], Sulfa antibiotics, and  Erythromycin  Medications: Prior to Admission medications   Medication Sig Start Date End Date Taking? Authorizing Provider  acetaminophen (TYLENOL) 500 MG tablet Take 500 mg by mouth every 6 (six) hours as needed (for pain.).    [provider]  APPLE CIDER VINEGAR PO Take 450 mg by mouth daily.    [provider]  Calcium Carbonate-Vitamin D3 600-400 MG-UNIT TABS Take 1 tablet by mouth daily.    [provider]  carvedilol (COREG) 6.25 MG tablet Take 6.25 mg by mouth 2 (two) times daily with a meal.    [provider]  folic acid (FOLVITE) 1 MG tablet Take 1 mg by mouth daily.    [provider]  furosemide (LASIX) 40 MG tablet Take 20 mg by mouth daily as needed for edema. 07/24/19   [provider]  loperamide (IMODIUM) 2 MG capsule Take 2 mg by mouth daily.    [provider]  magnesium gluconate (MAGONATE) 500 MG tablet Take 500 mg by mouth daily as needed (cramps).    [provider]  methotrexate (RHEUMATREX) 2.5 MG tablet Take 20 mg by mouth every Sunday. Caution:Chemotherapy. Protect from light.    [provider]  Multiple Vitamins-Minerals (CENTRUM SILVER PO) Take 1 tablet by mouth daily.    [provider]  NOVOLOG MIX 70/30 FLEXPEN (70-30) 100 UNIT/ML FlexPen Inject 20-30 Units into the skin See admin instructions. Take 30 units in the am and Take 20 units in the evening 09/14/16   [provider]  omeprazole (PRILOSEC) 40 MG capsule Take 40 mg by mouth daily.  11/17/16   [provider]  rivaroxaban (XARELTO) 20 MG TABS tablet Take 1 tablet (20 mg total) by mouth daily with supper. 11/21/20   Derek Jack, MD  rOPINIRole (REQUIP) 1 MG tablet Take 1 mg by mouth every morning. MAY TAKE 2 ADDITIONAL DOSES  AS NEEDED    [provider]  Saline (AYR SALINE NASAL DROPS) 0.65 % SOLN Place 1 spray into the nose daily as needed (congestion).    [provider]   vitamin E 180 MG (400 UNITS) capsule Take 400 Units by mouth daily.    [provider]     Family History  Problem Relation Age of Onset  . Colon cancer Neg Hx     Social History   Socioeconomic History  . Marital status: Widowed    Spouse name: Not on file  . Number of children: Not on file  . Years of education: Not on file  . Highest education level: Not on file  Occupational History  . Not on file  Tobacco Use  . Smoking status: Former Smoker    Packs/day: 1.00    Years: 40.00    Pack years: 40.00    Types: Cigarettes    Quit date: 08/18/2004    Years since quitting: 16.3  . Smokeless tobacco: Never Used  Vaping Use  . Vaping Use: Never used  Substance and Sexual Activity  . Alcohol use: No  . Drug use: No  . Sexual activity: Not Currently    Birth control/protection: Surgical  Other Topics Concern  . Not on file  Social History Narrative  .  Not on file   Social Determinants of Health   Financial Resource Strain: Not on file  Food Insecurity: Not on file  Transportation Needs: Not on file  Physical Activity: Not on file  Stress: Not on file  Social Connections: Not on file    ECOG Status: 1 - Symptomatic but completely ambulatory  Review of Systems  Review of Systems: A 12 point ROS discussed and pertinent positives are indicated in the HPI above.  All other systems are negative.  Physical Exam No direct physical exam was performed (except for noted visual exam findings with Video Visits).   Vital Signs: There were no vitals taken for this visit.  Imaging: CT ABDOMEN PELVIS WO CONTRAST  Result Date: 11/30/2020 CLINICAL DATA:  Known history of colorectal cancer and hepatocellular carcinoma, now with acute onset of abdominal pain following liver lesion biopsy. EXAM: CT ABDOMEN AND PELVIS WITHOUT CONTRAST TECHNIQUE: Multidetector CT imaging of the abdomen and pelvis was performed following the standard protocol without IV contrast.  COMPARISON:  CT abdomen and pelvis-10/25/2020; 09/16/2018 FINDINGS: The lack of intravenous contrast limits the ability to evaluate solid abdominal organs. Lower chest: Limited visualization of the lower thorax demonstrates minimal dependent subpleural ground-glass atelectasis. Unchanged punctate (0.6 cm) sub pleural nodule within the right middle lobe is unchanged since at least the 08/2018 examination and thus of benign etiology. No discrete focal airspace opacities. No pleural effusion. Normal heart size. No pericardial effusion. Calcifications of the mitral valve annulus. Hepatobiliary: Normal hepatic contour. Known infiltrative mass within the central aspect of the liver appears grossly unchanged. There is a minimal amount intraparenchymal air along the tract from the biopsy adjacent to the ill-defined mixed attenuating lesion within the left lobe of the liver (image 19 and 20, series 3). Interval development of an adjacent area of hypo attenuation within the subcapsular aspect of the lateral segment of the left lobe of the liver (image 17, series 3 with associated adjacent subcapsular hypoattenuating fluid which demonstrates Hounsfield units suggestive of hemorrhage (image 18, series 3. No definitive intraparenchymal extension of suspected subcapsular hemorrhage though note is made of a small amount perisplenic fluid. Post cholecystectomy. Pancreas: Normal noncontrast appearance of the pancreas. Spleen: Note is made of a small amount of perisplenic fluid which demonstrates Hounsfield units of 22 (image 20, series 3) Adrenals/Urinary Tract: Mild atrophy of the bilateral kidneys. No renal stones. There is a minimal amount of likely age and body habitus related perinephric stranding. No urine obstruction Normal noncontrast appearance the bilateral adrenal glands. Normal appearance of the urinary bladder given underdistention. Stomach/Bowel: Moderate to large colonic stool burden without evidence of enteric  obstruction. Scattered colonic diverticulosis without evidence of superimposed acute diverticulitis. Stable sequela of right hemicolectomy without evidence of enteric obstruction. No discrete areas of bowel wall thickening. Small hiatal hernia. No pneumoperitoneum, pneumatosis or portal venous gas. Vascular/Lymphatic: Extensive atherosclerotic plaque within normal caliber abdominal aorta. No bulky retroperitoneal, mesenteric, pelvic or inguinal lymphadenopathy on this noncontrast examination. Reproductive: Post hysterectomy. No discrete adnexal lesions. There is a trace amount of fluid in the pelvic cul-de-sac. Other: Somewhat nodular foci of subcutaneous stranding within the abdominal pannus likely represents the location of subcutaneous medication administration. Mild diffuse body wall anasarca. Musculoskeletal: No definite acute or aggressive osseous abnormalities. Moderate multilevel lumbar spine DDD is suspected though incompletely evaluated. IMPRESSION: 1. Suspected development of intra parenchymal hematoma with associated subcapsular extension following ultrasound-guided liver lesion biopsy. No definite intra parenchymal extension however note is made of a small amount  of perisplenic fluid. 2. Similar appearance of known infiltrative mass with the central aspects of the liver. 3. Stable sequela of right hemicolectomy without evidence of enteric obstruction. 4. Aortic Atherosclerosis (ICD10-I70.0). PLAN: Given above, patient subsequently underwent multiphase contrast-enhanced CT. Electronically Signed   By: Sandi Mariscal M.D.   On: 11/30/2020 09:11   US BIOPSY (LIVER)  Result Date: 11/29/2020 INDICATION: History of hepatocellular carcinoma as well as colon cancer, now with indeterminate liver lesions. Please perform ultrasound-guided liver lesion biopsy for tissue diagnostic purposes. EXAM: ULTRASOUND GUIDED LIVER LESION BIOPSY COMPARISON:  CT abdomen pelvis-10/26/2020; abdominal MRI-09/21/2020 MEDICATIONS:  None ANESTHESIA/SEDATION: Fentanyl 50 mcg IV; Versed 1 mg IV Total Moderate Sedation time:  15 Minutes. The patient's level of consciousness and vital signs were monitored continuously by radiology nursing throughout the procedure under my direct supervision. COMPLICATIONS: None immediate. PROCEDURE: Informed written consent was obtained from the patient after a discussion of the risks, benefits and alternatives to treatment. The patient understands and consents the procedure. A timeout was performed prior to the initiation of the procedure. Ultrasound scanning was performed of the right upper abdominal quadrant demonstrates an approximately 1.5 x 1.4 cm hypoechoic lesion within the left lobe of the liver correlating with the hypervascular lesion seen on preceding abdominal CT image 28, series 3. The procedure was planned. The midline of the upper abdomen was prepped and draped in the usual sterile fashion. The overlying soft tissues were anesthetized with 1% lidocaine with epinephrine. A 17 gauge, 6.8 cm co-axial needle was advanced into a peripheral aspect of the lesion. This was followed by 4 core biopsies with an 18 gauge core device under direct ultrasound guidance. The coaxial needle tract was embolized with a small amount of Gel-Foam slurry and superficial hemostasis was obtained with manual compression. Post procedural scanning was negative for definitive area of hemorrhage or additional complication. A dressing was placed. The patient tolerated the procedure well without immediate post procedural complication. IMPRESSION: Technically successful ultrasound guided core needle biopsy of indeterminate lesion within the left lobe of the liver. Electronically Signed   By: Sandi Mariscal M.D.   On: 11/29/2020 14:26   VAS [place] LOWER EXTREMITY VENOUS (DVT)  Result Date: 12/02/2020  Lower Venous DVT Study Indications: Pulmonary embolism 10/25/20.  Risk Factors: Cancer newly diagnosed hepatocellular carcinoma 11/29/20.  Limitations: Body habitus. Comparison Study: No prior study Performing Technologist: Sharion Dove RVS  Examination Guidelines: A complete evaluation includes B-mode imaging, spectral Doppler, color Doppler, and power Doppler as needed of all accessible portions of each vessel. Bilateral testing is considered an integral part of a complete examination. Limited examinations for reoccurring indications may be performed as noted. The reflux portion of the exam is performed with the patient in reverse Trendelenburg.  +---------+---------------+---------+-----------+----------+-------------------+ RIGHT    CompressibilityPhasicitySpontaneityPropertiesThrombus Aging      +---------+---------------+---------+-----------+----------+-------------------+ CFV      Partial        No       No                   Age Indeterminate   +---------+---------------+---------+-----------+----------+-------------------+ SFJ      Partial                                      Age Indeterminate   +---------+---------------+---------+-----------+----------+-------------------+ FV Prox  Full                                                             +---------+---------------+---------+-----------+----------+-------------------+  FV Mid   Full                                                             +---------+---------------+---------+-----------+----------+-------------------+ FV DistalFull                                                             +---------+---------------+---------+-----------+----------+-------------------+ PFV      Full                                                             +---------+---------------+---------+-----------+----------+-------------------+ POP      Full           Yes      Yes                                      +---------+---------------+---------+-----------+----------+-------------------+ PTV      Full                                                              +---------+---------------+---------+-----------+----------+-------------------+ PERO                                                  Not well visualized +---------+---------------+---------+-----------+----------+-------------------+   +---------+---------------+---------+-----------+----------+-------------------+ LEFT     CompressibilityPhasicitySpontaneityPropertiesThrombus Aging      +---------+---------------+---------+-----------+----------+-------------------+ CFV      Full           Yes      Yes                                      +---------+---------------+---------+-----------+----------+-------------------+ SFJ      Full                                                             +---------+---------------+---------+-----------+----------+-------------------+ FV Prox  Full                                                             +---------+---------------+---------+-----------+----------+-------------------+ FV Mid   Full                                                             +---------+---------------+---------+-----------+----------+-------------------+  FV DistalFull                                                             +---------+---------------+---------+-----------+----------+-------------------+ PFV      Full                                                             +---------+---------------+---------+-----------+----------+-------------------+ POP      Full           Yes      Yes                                      +---------+---------------+---------+-----------+----------+-------------------+ PTV      Full                                                             +---------+---------------+---------+-----------+----------+-------------------+ PERO                                                  Not well visualized  +---------+---------------+---------+-----------+----------+-------------------+    Summary: RIGHT: - Findings consistent with age indeterminate deep vein thrombosis involving the right common femoral vein, and SF junction.  LEFT: - There is no evidence of deep vein thrombosis in the lower extremity. However, portions of this examination were limited- see technologist comments above.  *See table(s) above for measurements and observations. Electronically signed by Servando Snare MD on 12/02/2020 at 9:00:54 AM.    Final    CT Angio Abd/Pel w/ and/or w/o  Result Date: 11/30/2020 CLINICAL DATA:  Concern for intraparenchymal hemorrhage with subcapsular extension preceding noncontrast CT following ultrasound-guided liver lesion biopsy. EXAM: CTA ABDOMEN AND PELVIS WITHOUT AND WITH CONTRAST TECHNIQUE: Multidetector CT imaging of the abdomen and pelvis was performed using the standard protocol during bolus administration of intravenous contrast. Multiplanar reconstructed images and MIPs were obtained and reviewed to evaluate the vascular anatomy. CONTRAST:  176mL OMNIPAQUE IOHEXOL 350 MG/ML SOLN COMPARISON:  Noncontrast CT scan of the abdomen and pelvis-earlier same day; CT abdomen pelvis-10/25/2020; 03/29/2019; chest CT-06/23/2017 FINDINGS: VASCULAR Aorta: Moderate to large amount of eccentric calcified atherosclerotic plaque throughout the normal caliber abdominal aorta. Calcified atherosclerotic plaque at the origin of the takeoff of the accessory left renal artery results in approximately 50% luminal narrowing (image 63, series 5). No evidence of abdominal aortic dissection or periaortic stranding. Celiac: Redemonstrated separate origins of the common hepatic and splenic arteries in lieu of a conventional celiac trunk. There is a minimal amount of eccentric noncalcified atherosclerotic plaque involving the origin of the takeoff of the common hepatic artery, not resulting in hemodynamically significant stenosis. The  splenic artery is widely patent without hemodynamically significant stenosis. SMA: There is  a minimal amount of eccentric calcified atherosclerotic plaque involving the origin and main trunk of the SMA, not resulting in hemodynamically significant stenosis. Conventional branching pattern. The distal tributaries the SMA appear widely patent without discrete lumen filling defect. Renals: Note is made of 3 separate left-sided renal arteries there is a moderate amount of eccentric predominantly calcified atherosclerotic plaque involving the origin of the dominant left renal artery not definitely resulting in hemodynamically significant stenosis. The solitary right renal artery is widely patent without hemodynamically significant narrowing. No vessel irregularity to suggest FMD. IMA: Disease at its origin though remains patent. 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 The bilateral common iliac arteries are disease though without a definitive hemodynamically significant stenosis. The bilateral internal iliac arteries are disease though patent and of normal caliber. Bilateral external iliac arteries are mildly disease though patent and of caliber. Proximal Outflow: There is a minimal amount of calcified atherosclerotic plaque involving the bilateral common femoral arteries, not resulting in hemodynamically significant stenosis. The imaged portions of the bilateral deep and superficial femoral arteries are of caliber and widely patent without hemodynamically significant narrowing. Veins: The IVC appears widely patent as does the portal venous system. Review of the MIP images confirms the above findings. _________________________________________________________ NON-VASCULAR Lower chest: Limited visualization of lower thorax demonstrates minimal dependent subpleural ground-glass atelectasis. The approximately 0.7 cm  subpleural nodule within the right middle lobe (image 22, series 9), is unchanged since at least the 06/2017 examination and thus a benign etiology. No pleural effusion or pneumothorax. Normal heart size. No pericardial effusion. Hepatobiliary: Normal hepatic contour. Redemonstrated mixed attenuating mass within the central aspect of the liver measuring approximately 3.9 x 3.3 cm (image 19, series 6). Redemonstrated approximately 1.3 x 1.0 cm hypoattenuating lesion within the left lobe of the liver with adjacent Gel-Foam embolization material (representative images 18 and 19, series 6). Interval development of an adjacent intraparenchymal hematomas with medial component measuring approximately 2.2 x 1.3 cm (image 22, series 6) and dominant component within the more lateral aspect of the lateral segment of left lobe of the liver measuring approximately 2.8 x 2.1 cm (image 17, series 6) and appears to extend to the liver capsule (image 19, series 6) with associated adjacent subcapsular hematoma. This finding is associated with minimal pooling of contrast within the central aspect of the hematoma (image 17, series 6). No definitive intra part parenchymal extension however note is made of a small amount of perisplenic fluid. Pancreas: Normal appearance of the pancreas. Spleen: Note is made of a small amount of perisplenic fluid. Adrenals/Urinary Tract: There is symmetric enhancement of the bilateral kidneys. No evidence of nephrolithiasis on this postcontrast examination. No discrete renal lesions. There is a minimal amount of likely age and body habitus related bilateral perinephric stranding. No urine obstruction. Normal appearance of the bilateral adrenal glands. The urinary bladder is underdistended. Stomach/Bowel: Colonic diverticulosis without evidence of superimposed acute diverticulitis. Moderate colonic stool burden. Sequela right hemicolectomy without evidence of enteric obstruction. Small hiatal hernia. No  pneumoperitoneum, pneumatosis or portal venous gas. Lymphatic: No bulky retroperitoneal, mesenteric, pelvic or inguinal lymphadenopathy. Reproductive: Post hysterectomy. No discrete adnexal lesions. Trace amount of fluid within the pelvic cul-de-sac. Other: Nodular thickening involving the subcutaneous tissues of the undersurface of the pannus, likely at the location of subcutaneous medication administration. Minimal amount of subcutaneous edema about midline of the low back. Musculoskeletal: No acute or aggressive osseous abnormalities.  Stigmata of dish within the thoracic spine. Posteriorly directed disc osteophyte complex at T12-L1. IMPRESSION: Vascular Impression: 1. Post ultrasound-guided left sided liver lesion biopsy complicated by development of intraparenchymal hematomas. There is pooling of contrast within the dominant more laterally located hematoma with apparent extension of the hematoma to the liver capsule edge with associated adjacent subcapsular hemorrhage. 2. Small amount of perisplenic fluid. Otherwise, no definitive intraparenchymal extension. 3. Large amount of atherosclerotic plaque including a suspected hemodynamically significant stenosis within the infrarenal abdominal aorta. Aortic Atherosclerosis (ICD10-I70.0). 4. 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 5. Non conventional arterial anatomy including separate origins of the common hepatic and splenic arteries in lieu of a conventional celiac trunk as detailed above. Nonvascular Impression: 1. Image grossly unchanged appearance of known dominant infiltrative mass within the central aspect of the liver, compatible with biopsy-proven hepatocellular carcinoma. 2. Sequela of right hemicolectomy without evidence of enteric obstruction. 3. Colonic diverticulosis without evidence of superimposed acute diverticulitis. 4. Small hiatal hernia. PLAN: Given above findings the  decision was made to admit the patient for observation as she fortunately remained hemodynamically stable with subjective improvement in her transiently worsening abdominal pain. We will monitor the patient's H and H and keep the patient NPO in case arteriogram and embolization is ultimately required. Electronically Signed   By: Sandi Mariscal M.D.   On: 11/30/2020 09:47    Labs:  CBC: Recent Labs    12/03/20 0823 12/03/20 1957 12/04/20 0102 12/04/20 0847  WBC 8.3 7.6 6.8 6.6  HGB 8.3* 9.3* 8.4* 8.4*  HCT 25.5* 28.9* 25.9* 26.1*  PLT 144* 190 162 173    COAGS: Recent Labs    11/29/20 1129 12/01/20 0929  INR 1.0 1.1    BMP: Recent Labs    12/26/19 1008 04/02/20 0909 09/19/20 1032 12/01/20 0050 12/02/20 0216 12/03/20 0132 12/04/20 0102  NA 139 139   < > 138 138 138 135  K 3.8 4.8   < > 4.5 4.2 4.3 4.1  CL 102 104   < > 108 108 107 104  CO2 29 26   < > 26 25 23 25   GLUCOSE 113* 198*   < > 143* 175* 225* 162*  BUN 23 20   < > 22 19 17 14   CALCIUM 9.0 9.5   < > 8.2* 8.1* 8.5* 8.3*  CREATININE 1.00 1.14*   < > 1.17* 1.06* 1.30* 0.99  GFRNONAA 55* 47*   < > 48* 54* 43* 59*  GFRAA >60 54*  --   --   --   --   --    < > = values in this interval not displayed.    LIVER FUNCTION TESTS: Recent Labs    12/01/20 0050 12/02/20 0216 12/03/20 0132 12/04/20 0102  BILITOT 0.8 0.5 0.7 0.8  AST 62* 35 27 40  ALT 66* 52* 39 45*  ALKPHOS 31* 33* 33* 37*  PROT 4.8* 5.1* 5.1* 5.1*  ALBUMIN 2.5* 2.4* 2.5* 2.4*    TUMOR MARKERS:    US guided liver lesion Bx - 11/29/2020:   Assessment and Plan:  Wanda Curry is a 75 y.o. female with past medical history significant for colon cancer, rheumatoid arthritis, diabetes and hyperlipidemia who was found to have an indeterminate slowly growing lesion within the central aspect of the liver for which he ultimately underwent ultrasound-guided liver biopsy on 05/11/2019 with pathologic diagnosis of well to moderately differentiated  hepatocellular  carcinoma,ultimately undergoinga blandhepatic embolizationon12/02/2020.  Patient is seen today in telemedicine consultation following ultrasound-guided biopsy of the new hypervascular lesion within the lateral segment of the left lobe of the liver performed 11/29/2020 (confirming diagnosis of multifocal well to moderately differentiated hepatocellular carcinoma) complicated by development intraparenchymal and ultimately subcapsular hematoma requiring prolonged hospitalization though ultimately without dedicated intervention.  Again, the patient is not a candidate for ablative technologies as the previously embolized dominant mass is greater than 3 cm and located in the central aspect of the liver posing significant risk to the biliary tree.  As the patient now has bilobar disease, I do not feel the patient is a candidate for bland embolization, rather I will now pursue Y 90 radioembolization in hopes of achieving a more global/generalized treatment.   As such, prolonged conversations were held with the patient and the patient's daughter regarding the benefits and risks of bilobar Y-90 radioembolization including but not limited to bleeding, vessel injury, worsening liver function and nontarget embolization.  Again, I explained that transcatheter treatment options are not curative and this procedure will be performed with palliative intent in hopes of achieving disease stability to improvement for some length of time.   Given the presence of bilobar disease, 3 separate treatments will be required (mapping procedure followed by radioembolization of the right and then left lobe of the liver).  Following this prolonged and detailed conversation with both the patient and the patient's daughter, they wish to pursue Y 57 radioembolization.  Note, as a diagnostic CTA was performed on 11/29/2020 following her ultrasound-guided liver lesion biopsy this does not need to be repeated.  As such we  will and initiate the insurance authorization for Y 90 radioembolization.  All procedures formed at [facility] on an outpatient basis.   Plan: - [employer] authorization and schedule mapping Y 90 radioembolization at [facility].  A copy of this report was sent to the requesting provider on this date.  Electronically Signed: Sandi Mariscal 12/12/2020, 2:51 PM   I spent a total of 25 Minutes in remote  clinical consultation, greater than 50% of which was counseling/coordinating care for biopsy-proven multifocal hepatocellular carcinoma.    Visit type: Audio only (telephone). Audio (no video) only due to patient's lack of internet/smartphone capability. Alternative for in-person consultation at [facility], [address], [place], [place]. This visit type was conducted due to national recommendations for restrictions regarding the COVID-19 Pandemic (e.g. social distancing).  This format is felt to be most appropriate for this patient at this time.  All issues noted in this document were discussed and addressed.

## 2020-12-19 ENCOUNTER — Telehealth (HOSPITAL_COMMUNITY): Payer: Self-pay

## 2020-12-19 ENCOUNTER — Other Ambulatory Visit (HOSPITAL_COMMUNITY): Payer: Self-pay | Admitting: Interventional Radiology

## 2020-12-19 ENCOUNTER — Encounter (HOSPITAL_COMMUNITY): Payer: Self-pay

## 2020-12-19 DIAGNOSIS — C787 Secondary malignant neoplasm of liver and intrahepatic bile duct: Secondary | ICD-10-CM

## 2020-12-19 DIAGNOSIS — C189 Malignant neoplasm of colon, unspecified: Secondary | ICD-10-CM

## 2020-12-19 NOTE — Telephone Encounter (Signed)
This nurse spoke with Radiology about patients Y 90 treatments.  Advised them that Xarelto may be held 1 day prior to the patients procedure.  They acknowledged understanding, no further questions or concerns at this time.

## 2020-12-31 ENCOUNTER — Other Ambulatory Visit: Payer: Self-pay | Admitting: Radiology

## 2021-01-02 ENCOUNTER — Other Ambulatory Visit (HOSPITAL_COMMUNITY): Payer: Self-pay | Admitting: Interventional Radiology

## 2021-01-02 ENCOUNTER — Encounter (HOSPITAL_COMMUNITY)
Admission: RE | Admit: 2021-01-02 | Discharge: 2021-01-02 | Disposition: A | Discharge status: Home / Self Care | Payer: Medicare Other | Source: Ambulatory Visit | Attending: Interventional Radiology | Admitting: Interventional Radiology

## 2021-01-02 ENCOUNTER — Ambulatory Visit (HOSPITAL_COMMUNITY)
Admission: RE | Admit: 2021-01-02 | Discharge: 2021-01-02 | Disposition: A | Discharge status: Home / Self Care | Payer: Medicare Other | Source: Ambulatory Visit | Attending: Interventional Radiology | Admitting: Interventional Radiology

## 2021-01-02 ENCOUNTER — Encounter (HOSPITAL_COMMUNITY): Payer: Self-pay

## 2021-01-02 ENCOUNTER — Other Ambulatory Visit: Payer: Self-pay

## 2021-01-02 DIAGNOSIS — E785 Hyperlipidemia, unspecified: Secondary | ICD-10-CM | POA: Insufficient documentation

## 2021-01-02 DIAGNOSIS — C189 Malignant neoplasm of colon, unspecified: Secondary | ICD-10-CM

## 2021-01-02 DIAGNOSIS — C787 Secondary malignant neoplasm of liver and intrahepatic bile duct: Secondary | ICD-10-CM

## 2021-01-02 DIAGNOSIS — Z888 Allergy status to other drugs, medicaments and biological substances status: Secondary | ICD-10-CM | POA: Diagnosis not present

## 2021-01-02 DIAGNOSIS — E119 Type 2 diabetes mellitus without complications: Secondary | ICD-10-CM | POA: Diagnosis not present

## 2021-01-02 DIAGNOSIS — Z79899 Other long term (current) drug therapy: Secondary | ICD-10-CM | POA: Insufficient documentation

## 2021-01-02 DIAGNOSIS — Z87891 Personal history of nicotine dependence: Secondary | ICD-10-CM | POA: Insufficient documentation

## 2021-01-02 DIAGNOSIS — Z794 Long term (current) use of insulin: Secondary | ICD-10-CM | POA: Diagnosis not present

## 2021-01-02 DIAGNOSIS — M797 Fibromyalgia: Secondary | ICD-10-CM | POA: Insufficient documentation

## 2021-01-02 DIAGNOSIS — M069 Rheumatoid arthritis, unspecified: Secondary | ICD-10-CM | POA: Insufficient documentation

## 2021-01-02 DIAGNOSIS — C220 Liver cell carcinoma: Secondary | ICD-10-CM | POA: Insufficient documentation

## 2021-01-02 DIAGNOSIS — Z7901 Long term (current) use of anticoagulants: Secondary | ICD-10-CM | POA: Insufficient documentation

## 2021-01-02 DIAGNOSIS — Z882 Allergy status to sulfonamides status: Secondary | ICD-10-CM | POA: Insufficient documentation

## 2021-01-02 HISTORY — PX: IR US GUIDE VASC ACCESS RIGHT: IMG2390

## 2021-01-02 HISTORY — PX: IR ANGIOGRAM SELECTIVE EACH ADDITIONAL VESSEL: IMG667

## 2021-01-02 HISTORY — PX: IR ANGIOGRAM VISCERAL SELECTIVE: IMG657

## 2021-01-02 HISTORY — PX: IR EMBO ARTERIAL NOT HEMORR HEMANG INC GUIDE ROADMAPPING: IMG5448

## 2021-01-02 LAB — CBC WITH DIFFERENTIAL/PLATELET
Abs Immature Granulocytes: 0.04 10*3/uL (ref 0.00–0.07)
Basophils Absolute: 0 10*3/uL (ref 0.0–0.1)
Basophils Relative: 0 %
Eosinophils Absolute: 0.1 10*3/uL (ref 0.0–0.5)
Eosinophils Relative: 2 %
HCT: 34.1 % — ABNORMAL LOW (ref 36.0–46.0)
Hemoglobin: 10.7 g/dL — ABNORMAL LOW (ref 12.0–15.0)
Immature Granulocytes: 0 %
Lymphocytes Relative: 18 %
Lymphs Abs: 1.6 10*3/uL (ref 0.7–4.0)
MCH: 29.5 pg (ref 26.0–34.0)
MCHC: 31.4 g/dL (ref 30.0–36.0)
MCV: 93.9 fL (ref 80.0–100.0)
Monocytes Absolute: 0.5 10*3/uL (ref 0.1–1.0)
Monocytes Relative: 5 %
Neutro Abs: 6.7 10*3/uL (ref 1.7–7.7)
Neutrophils Relative %: 75 %
Platelets: 251 10*3/uL (ref 150–400)
RBC: 3.63 MIL/uL — ABNORMAL LOW (ref 3.87–5.11)
RDW: 13.7 % (ref 11.5–15.5)
WBC: 8.9 10*3/uL (ref 4.0–10.5)
nRBC: 0 % (ref 0.0–0.2)

## 2021-01-02 LAB — COMPREHENSIVE METABOLIC PANEL
ALT: 20 U/L (ref 0–44)
AST: 19 U/L (ref 15–41)
Albumin: 3.1 g/dL — ABNORMAL LOW (ref 3.5–5.0)
Alkaline Phosphatase: 37 U/L — ABNORMAL LOW (ref 38–126)
Anion gap: 9 (ref 5–15)
BUN: 16 mg/dL (ref 8–23)
CO2: 25 mmol/L (ref 22–32)
Calcium: 8.8 mg/dL — ABNORMAL LOW (ref 8.9–10.3)
Chloride: 102 mmol/L (ref 98–111)
Creatinine, Ser: 0.9 mg/dL (ref 0.44–1.00)
GFR, Estimated: >60 mL/min (ref >60)
Glucose, Bld: 245 mg/dL — ABNORMAL HIGH (ref 70–99)
Potassium: 4 mmol/L (ref 3.5–5.1)
Sodium: 136 mmol/L (ref 135–145)
Total Bilirubin: 0.8 mg/dL (ref 0.3–1.2)
Total Protein: 6.4 g/dL — ABNORMAL LOW (ref 6.5–8.1)

## 2021-01-02 LAB — GLUCOSE, CAPILLARY
Glucose-Capillary: 261 mg/dL — ABNORMAL HIGH (ref 70–99)
Glucose-Capillary: 245 mg/dL — ABNORMAL HIGH (ref 70–99)

## 2021-01-02 LAB — PROTIME-INR
INR: 1.2 (ref 0.8–1.2)
Prothrombin Time: 15.1 seconds (ref 11.4–15.2)

## 2021-01-02 MED ORDER — LIDOCAINE HCL (PF) 1 % IJ SOLN
INTRAMUSCULAR | Status: AC | PRN
  Administered 2021-01-02: 5 mL

## 2021-01-02 MED ORDER — FENTANYL CITRATE (PF) 100 MCG/2ML IJ SOLN
INTRAMUSCULAR | Status: AC | PRN
  Administered 2021-01-02 (×2): 25 ug via INTRAVENOUS
  Administered 2021-01-02: 50 ug via INTRAVENOUS

## 2021-01-02 MED ORDER — IOHEXOL 300 MG/ML  SOLN
50.0000 mL | Freq: Once | INTRAMUSCULAR | Status: AC | PRN
  Administered 2021-01-02: 45 mL via INTRA_ARTERIAL

## 2021-01-02 MED ORDER — LIDOCAINE HCL 1 % IJ SOLN
INTRAMUSCULAR | Status: AC
  Filled 2021-01-02: qty 20

## 2021-01-02 MED ORDER — FENTANYL CITRATE (PF) 100 MCG/2ML IJ SOLN
INTRAMUSCULAR | Status: AC
  Filled 2021-01-02: qty 2

## 2021-01-02 MED ORDER — INSULIN ASPART 100 UNIT/ML IJ SOLN
5.0000 [IU] | Freq: Once | INTRAMUSCULAR | Status: AC
  Administered 2021-01-02: 5 [IU] via SUBCUTANEOUS
  Filled 2021-01-02: qty 0.05

## 2021-01-02 MED ORDER — MIDAZOLAM HCL 2 MG/2ML IJ SOLN
INTRAMUSCULAR | Status: AC | PRN
  Administered 2021-01-02: 1 mg via INTRAVENOUS
  Administered 2021-01-02 (×2): 0.5 mg via INTRAVENOUS

## 2021-01-02 MED ORDER — TECHNETIUM TO 99M ALBUMIN AGGREGATED
4.3000 | Freq: Once | INTRAVENOUS | Status: AC | PRN
  Administered 2021-01-02: 4.3 via INTRAVENOUS

## 2021-01-02 MED ORDER — INSULIN ASPART PROT & ASPART (70-30 MIX) 100 UNIT/ML ~~LOC~~ SUSP
30.0000 [IU] | Freq: Every day | SUBCUTANEOUS | Status: DC
  Filled 2021-01-02: qty 10

## 2021-01-02 MED ORDER — INSULIN ASPART 100 UNIT/ML IJ SOLN
INTRAMUSCULAR | Status: AC
  Filled 2021-01-02: qty 1

## 2021-01-02 MED ORDER — MIDAZOLAM HCL 2 MG/2ML IJ SOLN
INTRAMUSCULAR | Status: AC
  Filled 2021-01-02: qty 4

## 2021-01-02 MED ORDER — INSULIN ASPART PROT & ASPART (70-30 MIX) 100 UNIT/ML ~~LOC~~ SUSP
30.0000 [IU] | SUBCUTANEOUS | Status: DC
  Administered 2021-01-02: 30 [IU] via SUBCUTANEOUS

## 2021-01-02 MED ORDER — SODIUM CHLORIDE 0.9 % IV SOLN: INTRAVENOUS | Status: DC

## 2021-01-02 NOTE — Discharge Instructions (Signed)
Urgent needs - Interventional Radiology on call MD [phone]  70/30 insulin - 30u given at 235pm, do not take again this evening  Wound - May remove dressing and shower in 24 to 48 hours.  Keep site clean and dry.  Replace with bandaid as needed.  Do not submerge in tub or water until site healing well. If closed with glue, glue will flake off on its own.    Femoral Site Care  This sheet gives you information about how to care for yourself after your procedure. Your health care provider may also give you more specific instructions. If you have problems or questions, contact your health care provider. What can I expect after the procedure? After the procedure, it is common to have:  Bruising that usually fades within 1-2 weeks.  Tenderness at the site. Follow these instructions at home: Wound care  Follow instructions from your health care provider about how to take care of your insertion site. Make sure you: ? Wash your hands with soap and water before you change your bandage (dressing). If soap and water are not available, use hand sanitizer. ? Change your dressing as told by your health care provider. ? Leave stitches (sutures), skin glue, or adhesive strips in place. These skin closures may need to stay in place for 2 weeks or longer. If adhesive strip edges start to loosen and curl up, you may trim the loose edges. Do not remove adhesive strips completely unless your health care provider tells you to do that.  Do not take baths, swim, or use a hot tub until your health care provider approves.  You may shower 24-48 hours after the procedure or as told by your health care provider. ? Gently wash the site with plain soap and water. ? Pat the area dry with a clean towel. ? Do not rub the site. This may cause bleeding.  Do not apply powder or lotion to the site. Keep the site clean and dry.  Check your femoral site every day for signs of infection. Check for: ? Redness, swelling,  or pain. ? Fluid or blood. ? Warmth. ? Pus or a bad smell. Activity  For the first 2-3 days after your procedure, or as long as directed: ? Avoid climbing stairs as much as possible. ? Do not squat.  Do not lift anything that is heavier than 10 lb (4.5 kg), or the limit that you are told, until your health care provider says that it is safe.  Rest as directed. ? Avoid sitting for a long time without moving. Get up to take short walks every 1-2 hours.  Do not drive for 24 hours if you were given a medicine to help you relax (sedative). General instructions  Take over-the-counter and prescription medicines only as told by your health care provider.  Keep all follow-up visits as told by your health care provider. This is important. Contact a health care provider if you have:  A fever or chills.  You have redness, swelling, or pain around your insertion site. Get help right away if:  The catheter insertion area swells very fast.  You pass out.  You suddenly start to sweat or your skin gets clammy.  The catheter insertion area is bleeding, and the bleeding does not stop when you hold steady pressure on the area.  The area near or just beyond the catheter insertion site becomes pale, cool, tingly, or numb. These symptoms may represent a serious problem that is an emergency.  Do not wait to see if the symptoms will go away. Get medical help right away. Call your local emergency services (911 in the [place] Do not drive yourself to the hospital. Summary  After the procedure, it is common to have bruising that usually fades within 1-2 weeks.  Check your femoral site every day for signs of infection.  Do not lift anything that is heavier than 10 lb (4.5 kg), or the limit that you are told, until your health care provider says that it is safe. This information is not intended to replace advice given to you by your health care provider. Make sure you discuss any questions you have with  your health care provider. Document Revised: 04/06/2020 Document Reviewed: 04/06/2020 Elsevier Patient Education  2021 [employer].                  Moderate Conscious Sedation, Adult, Care After This sheet gives you information about how to care for yourself after your procedure. Your health care provider may also give you more specific instructions. If you have problems or questions, contact your health care provider. What can I expect after the procedure? After the procedure, it is common to have:  Sleepiness for several hours.  Impaired judgment for several hours.  Difficulty with balance.  Vomiting if you eat too soon. Follow these instructions at home: For the time period you were told by your health care provider:  Rest.  Do not participate in activities where you could fall or become injured.  Do not drive or use machinery.  Do not drink alcohol.  Do not take sleeping pills or medicines that cause drowsiness.  Do not make important decisions or sign legal documents.  Do not take care of children on your own.      Eating and drinking  Follow the diet recommended by your health care provider.  Drink enough fluid to keep your urine pale yellow.  If you vomit: ? Drink water, juice, or soup when you can drink without vomiting. ? Make sure you have little or no nausea before eating solid foods.   General instructions  Take over-the-counter and prescription medicines only as told by your health care provider.  Have a responsible adult stay with you for the time you are told. It is important to have someone help care for you until you are awake and alert.  Do not smoke.  Keep all follow-up visits as told by your health care provider. This is important. Contact a health care provider if:  You are still sleepy or having trouble with balance after 24 hours.  You feel light-headed.  You keep feeling nauseous or you keep vomiting.  You develop  a rash.  You have a fever.  You have redness or swelling around the IV site. Get help right away if:  You have trouble breathing.  You have new-onset confusion at home. Summary  After the procedure, it is common to feel sleepy, have impaired judgment, or feel nauseous if you eat too soon.  Rest after you get home. Know the things you should not do after the procedure.  Follow the diet recommended by your health care provider and drink enough fluid to keep your urine pale yellow.  Get help right away if you have trouble breathing or new-onset confusion at home. This information is not intended to replace advice given to you by your health care provider. Make sure you discuss any questions you have with your health care provider.  Document Revised: 12/02/2019 Document Reviewed: 06/30/2019 Elsevier Patient Education  2021 [employer].                                                                        The Following instructions are for you to review for your NEXT appointment: Post Y-90 Radioembolization Discharge Instructions  You have been given a radioactive material during your procedure.  While it is safe for you to be discharged home from the hospital, you need to proceed directly home.    Do not use public transportation, including air travel, lasting more than 2 hours for 1 week.  Avoid crowded public places for 1 week.  Adult visitors should try to avoid close contact with you for 1 week.    Children and pregnant females should not visit or have close contact with you for 1 week.  Items that you touch are not radioactive.  Do not sleep in the same bed as your partner for 1 week, and a condom should be used for sexual activity during the first 24 hours.  Your blood may be radioactive and caution should be used if any bleeding occurs during the recovery period.  Body fluids may be radioactive for 24 hours.  Wash your hands after voiding.  Men should sit to urinate.   Dispose of any soiled materials (flush down toilet or place in trash at home) during the first day.  Drink 6 to 8 glasses of fluids per day for 5 days to hydrate yourself.  If you need to see a doctor during the first week, you must let them know that you were treated with yttrium-90 microspheres, and will be slightly radioactive.  They can call Interventional Radiology [phone] with any questions.

## 2021-01-02 NOTE — H&P (Signed)
Chief Complaint: Patient was seen in consultation today for pre Y90 mapping.  Referring Physician(s): Derek Jack  Supervising Physician: Sandi Mariscal  Patient Status: [facility] - Out-pt  History of Present Illness: Wanda Curry is a 75 y.o. female with a past medical history significant for fibromyalgia, RA, HLD, DM, colon cancer and recently diagnosed hepatocellular carcinoma s/p bland hepatic embolization in IR on 07/24/20 who presents today for a pre Y90 radioembolization mapping. Wanda Curry has been followed by IR since 2021 when she underwent a bland hepatic embolization, she was noted to have a new hypervascular liver lesion within the lateral segment of the left lobe of the liver and biopsy on 11/29/20 confirmed multifocal well to moderately differentiated [place]. She met with Dr. Pascal Lux on 12/12/20 to discuss further disease management and ultimately decision was made to proceed Y90 radioembolization with palliative intent, she is here today for pre-procedure mapping.  Wanda Curry denies any complaints today, the abdominal pains he had after her liver biopsy has resolved. She has been more tired and less interested in eating but tries to stay hydrated by drinking several gatorades per day. She continues to have loose stool which is not new since having colon cancer, she takes imodium at least once a day which helps. She tells me she had pizza last night for dinner and hasn't taken her normal insulin today so that is why her blood sugar is running high this morning, she denies any s/s of hyperglycemia. She understands the procedure today and is agreeable to proceed as planned.  Past Medical History:  Diagnosis Date  . Anemia   . Arthritis    RA  . Cancer ([place])    skin cancer  . Depression   . Diabetes mellitus without complication ([place])   . Fibromyalgia   . GERD (gastroesophageal reflux disease)   . Heart murmur    ECHO scheduled 05-25-2015  . Hyperlipidemia   . Neuropathy    . PONV (postoperative nausea and vomiting)     Past Surgical History:  Procedure Laterality Date  . ABDOMINAL HYSTERECTOMY    . BIOPSY  09/23/2016   Procedure: BIOPSY;  Surgeon: Danie Binder, MD;  Location: [facility];  Service: Endoscopy;;  hepatic flexure mass  . CHOLECYSTECTOMY    . COLONOSCOPY N/A 08/13/2017   Procedure: COLONOSCOPY;  Surgeon: Daneil Dolin, MD;  Location: [facility];  Service: Endoscopy;  Laterality: N/A;  . COLONOSCOPY WITH PROPOFOL N/A 09/23/2016   Procedure: COLONOSCOPY WITH PROPOFOL;  Surgeon: Danie Binder, MD;  Location: [facility];  Service: Endoscopy;  Laterality: N/A;  7:30 am  . HARDWARE REMOVAL Right 05/30/2015   Procedure: HARDWARE REMOVAL;  Surgeon: Ninetta Lights, MD;  Location: [facility];  Service: Orthopedics;  Laterality: Right;  . IR ANGIOGRAM SELECTIVE EACH ADDITIONAL VESSEL  07/25/2019  . IR ANGIOGRAM SELECTIVE EACH ADDITIONAL VESSEL  07/25/2019  . IR ANGIOGRAM VISCERAL SELECTIVE  07/25/2019  . IR ANGIOGRAM VISCERAL SELECTIVE  07/25/2019  . IR EMBO TUMOR ORGAN ISCHEMIA INFARCT INC GUIDE ROADMAPPING  07/25/2019  . IR RADIOLOGIST EVAL & MGMT  06/09/2019  . IR RADIOLOGIST EVAL & MGMT  07/07/2019  . IR RADIOLOGIST EVAL & MGMT  01/05/2020  . IR RADIOLOGIST EVAL & MGMT  04/12/2020  . IR RADIOLOGIST EVAL & MGMT  09/27/2020  . IR RADIOLOGIST EVAL & MGMT  10/31/2020  . IR RADIOLOGIST EVAL & MGMT  12/12/2020  . IR US GUIDE VASC ACCESS RIGHT  07/25/2019  .  PARTIAL COLECTOMY N/A 10/10/2016   Procedure: PARTIAL COLECTOMY;  Surgeon: Franky Macho, MD;  Location: [facility];  Service: General;  Laterality: N/A;  . planter warts Bilateral    both feet  . POLYPECTOMY  09/23/2016   Procedure: POLYPECTOMY;  Surgeon: West Bali, MD;  Location: [facility];  Service: Endoscopy;;  transverse colon polyps times 2, rectal polyp  . skin grafts     due to planter warts  . TOTAL KNEE ARTHROPLASTY Right 05/30/2015   Procedure: RIGHT TOTAL KNEE ARTHROPLASTY;  Surgeon:  Loreta Ave, MD;  Location: [facility] OR;  Service: Orthopedics;  Laterality: Right;    Allergies: Xeloda [capecitabine], Adhesive [tape], Neosporin original [bacitracin-neomycin-polymyxin], Sulfa antibiotics, and Erythromycin  Medications: Prior to Admission medications   Medication Sig Start Date End Date Taking? Authorizing Provider  acetaminophen (TYLENOL) 500 MG tablet Take 500 mg by mouth every 6 (six) hours as needed (for pain.).   Yes [provider]  APPLE CIDER VINEGAR PO Take 450 mg by mouth daily.   Yes [provider]  Calcium Carbonate-Vitamin D3 600-400 MG-UNIT TABS Take 1 tablet by mouth daily.   Yes [provider]  carvedilol (COREG) 6.25 MG tablet Take 6.25 mg by mouth 2 (two) times daily with a meal.   Yes [provider]  folic acid (FOLVITE) 1 MG tablet Take 1 mg by mouth daily.   Yes [provider]  furosemide (LASIX) 40 MG tablet Take 20 mg by mouth daily as needed for edema. 07/24/19  Yes [provider]  loperamide (IMODIUM) 2 MG capsule Take 2 mg by mouth daily.   Yes [provider]  magnesium gluconate (MAGONATE) 500 MG tablet Take 500 mg by mouth daily as needed (cramps).   Yes [provider]  methotrexate (RHEUMATREX) 2.5 MG tablet Take 20 mg by mouth every Sunday. Caution:Chemotherapy. Protect from light.   Yes [provider]  Multiple Vitamins-Minerals (CENTRUM SILVER PO) Take 1 tablet by mouth daily.   Yes [provider]  NOVOLOG MIX 70/30 FLEXPEN (70-30) 100 UNIT/ML FlexPen Inject 20-30 Units into the skin See admin instructions. Take 30 units in the am and Take 20 units in the evening 09/14/16  Yes [provider]  omeprazole (PRILOSEC) 40 MG capsule Take 40 mg by mouth daily.  11/17/16  Yes [provider]  rOPINIRole (REQUIP) 1 MG tablet Take 1 mg by mouth every morning. MAY TAKE 2 ADDITIONAL DOSES  AS NEEDED   Yes [provider]  Saline (AYR  SALINE NASAL DROPS) 0.65 % SOLN Place 1 spray into the nose daily as needed (congestion).   Yes [provider]  vitamin E 180 MG (400 UNITS) capsule Take 400 Units by mouth daily.   Yes [provider]  rivaroxaban (XARELTO) 20 MG TABS tablet Take 1 tablet (20 mg total) by mouth daily with supper. 11/21/20   Doreatha Massed, MD     Family History  Problem Relation Age of Onset  . Colon cancer Neg Hx     Social History   Socioeconomic History  . Marital status: Widowed    Spouse name: Not on file  . Number of children: Not on file  . Years of education: Not on file  . Highest education level: Not on file  Occupational History  . Not on file  Tobacco Use  . Smoking status: Former Smoker    Packs/day: 1.00    Years: 40.00    Pack years: 40.00  Types: Cigarettes    Quit date: 08/18/2004    Years since quitting: 16.3  . Smokeless tobacco: Never Used  Vaping Use  . Vaping Use: Never used  Substance and Sexual Activity  . Alcohol use: No  . Drug use: No  . Sexual activity: Not Currently    Birth control/protection: Surgical  Other Topics Concern  . Not on file  Social History Narrative  . Not on file   Social Determinants of Health   Financial Resource Strain: Not on file  Food Insecurity: Not on file  Transportation Needs: Not on file  Physical Activity: Not on file  Stress: Not on file  Social Connections: Not on file     Review of Systems: A 12 point ROS discussed and pertinent positives are indicated in the HPI above.  All other systems are negative.  Review of Systems  Constitutional: Positive for appetite change ("less than usual lately"). Negative for chills and fever.  Respiratory: Negative for cough and shortness of breath.   Cardiovascular: Negative for chest pain.  Gastrointestinal: Positive for diarrhea (chronic since having colon cancer). Negative for abdominal pain, blood in stool, nausea and vomiting.  Musculoskeletal: Negative  for back pain.  Neurological: Negative for dizziness and headaches.    Vital Signs: BP (!) 166/68   Pulse 74   Temp 97.9 F (36.6 C) (Oral)   Resp 17   SpO2 97%   Physical Exam Vitals and nursing note reviewed.  Constitutional:      General: She is not in acute distress. HENT:     Head: Normocephalic.     Mouth/Throat:     Mouth: Mucous membranes are moist.     Pharynx: Oropharynx is clear. No oropharyngeal exudate or posterior oropharyngeal erythema.  Eyes:     General: No scleral icterus. Cardiovascular:     Rate and Rhythm: Normal rate and regular rhythm.  Pulmonary:     Effort: Pulmonary effort is normal.     Breath sounds: Normal breath sounds.  Abdominal:     General: There is no distension.     Palpations: Abdomen is soft.     Tenderness: There is no abdominal tenderness.  Skin:    General: Skin is warm and dry.     Coloration: Skin is not jaundiced.  Neurological:     Mental Status: She is alert and oriented to person, place, and time.  Psychiatric:        Mood and Affect: Mood normal.        Behavior: Behavior normal.        Thought Content: Thought content normal.        Judgment: Judgment normal.      MD Evaluation Airway: WNL Heart: WNL Abdomen: WNL Chest/ Lungs: WNL ASA  Classification: 2 Mallampati/Airway Score: Two   Imaging: IR Radiologist Eval & Mgmt  Result Date: 12/12/2020 Please refer to notes tab for details about interventional procedure. (Op Note)   Labs:  CBC: Recent Labs    12/03/20 1957 12/04/20 0102 12/04/20 0847 01/02/21 0820  WBC 7.6 6.8 6.6 8.9  HGB 9.3* 8.4* 8.4* 10.7*  HCT 28.9* 25.9* 26.1* 34.1*  PLT 190 162 173 251    COAGS: Recent Labs    11/29/20 1129 12/01/20 0929  INR 1.0 1.1    BMP: Recent Labs    04/02/20 0909 09/19/20 1032 12/01/20 0050 12/02/20 0216 12/03/20 0132 12/04/20 0102  NA 139   < > 138 138 138 135  K 4.8   < >  4.5 4.2 4.3 4.1  CL 104   < > 108 108 107 104  CO2 26   < > $R'26  25 23 25  'QZ$ GLUCOSE 198*   < > 143* 175* 225* 162*  BUN 20   < > $R'22 19 17 14  'UU$ CALCIUM 9.5   < > 8.2* 8.1* 8.5* 8.3*  CREATININE 1.14*   < > 1.17* 1.06* 1.30* 0.99  GFRNONAA 47*   < > 48* 54* 43* 59*  GFRAA 54*  --   --   --   --   --    < > = values in this interval not displayed.    LIVER FUNCTION TESTS: Recent Labs    12/01/20 0050 12/02/20 0216 12/03/20 0132 12/04/20 0102  BILITOT 0.8 0.5 0.7 0.8  AST 62* 35 27 40  ALT 66* 52* 39 45*  ALKPHOS 31* 33* 33* 37*  PROT 4.8* 5.1* 5.1* 5.1*  ALBUMIN 2.5* 2.4* 2.5* 2.4*    TUMOR MARKERS: No results for input(s): AFPTM, CEA, CA199, CHROMGRNA in the last 8760 hours.  Assessment and Plan:  75 y/o F with history of multifocal hepatocellular carcinoma s/p bland embolization 07/24/20 now with new hypervascular lesion within the lateral segment of the left liver lobe which was been biopsy proven to be [facility] who presents today for pre Y90 mapping.  CBG initially 261 and patient asymptomatic, given 5 units novolog with mild improvement to 245 after 30 minutes - given patient remains asymptomatic and baseline glucose per chart is 175-200 will not continue to correct hyperglycemia unless she becomes symptomatic. Plan to give home dose of 70/30 post procedure. Afebrile, WBC 8.9, hgb 10.7, plt 251, creatinine 0.90, INR 1.2.  Risks and benefits discussed with the patient including, but not limited to bleeding, infection, vascular injury, post procedural pain, nausea, vomiting and fatigue, contrast induced renal failure, liver failure, radiation injury to the bowel, radiation induced cholecystitis, neutropenia and possible need for additional procedures.  All of the patient's questions were answered, patient is agreeable to proceed.  Consent signed and in chart.  Thank you for this interesting consult.  I greatly enjoyed meeting Wanda Curry and look forward to participating in their care.  A copy of this report was sent to the requesting provider  on this date.  Electronically Signed: Joaquim Nam, PA-C 01/02/2021, 8:41 AM   I spent a total of  15 Minutes in face to face in clinical consultation, greater than 50% of which was counseling/coordinating care for pre Y90 mapping.

## 2021-01-02 NOTE — Sedation Documentation (Signed)
To nuc med for study 

## 2021-01-02 NOTE — Procedures (Signed)
Pre-procedure Diagnosis: Progressive multifocal HCC Post-procedure Diagnosis: Same  Post mapping Y-90 radioembolization.  Complications: None Immediate EBL: None  Keep right leg straight for 4 hrs.    Signed: Sandi Mariscal Pager: [phone] 01/02/2021, 11:21 AM

## 2021-01-17 ENCOUNTER — Other Ambulatory Visit: Payer: Self-pay | Admitting: Student

## 2021-01-18 ENCOUNTER — Other Ambulatory Visit (HOSPITAL_COMMUNITY): Payer: Self-pay | Admitting: Interventional Radiology

## 2021-01-18 ENCOUNTER — Ambulatory Visit (HOSPITAL_COMMUNITY)
Admission: RE | Admit: 2021-01-18 | Discharge: 2021-01-18 | Disposition: A | Discharge status: Home / Self Care | Payer: Medicare Other | Source: Ambulatory Visit | Attending: Interventional Radiology | Admitting: Interventional Radiology

## 2021-01-18 ENCOUNTER — Other Ambulatory Visit: Payer: Self-pay

## 2021-01-18 ENCOUNTER — Encounter (HOSPITAL_COMMUNITY)
Admission: RE | Admit: 2021-01-18 | Discharge: 2021-01-18 | Disposition: A | Discharge status: Home / Self Care | Payer: Medicare Other | Source: Ambulatory Visit | Attending: Interventional Radiology | Admitting: Interventional Radiology

## 2021-01-18 DIAGNOSIS — C189 Malignant neoplasm of colon, unspecified: Secondary | ICD-10-CM

## 2021-01-18 DIAGNOSIS — E785 Hyperlipidemia, unspecified: Secondary | ICD-10-CM | POA: Insufficient documentation

## 2021-01-18 DIAGNOSIS — C787 Secondary malignant neoplasm of liver and intrahepatic bile duct: Secondary | ICD-10-CM | POA: Insufficient documentation

## 2021-01-18 DIAGNOSIS — M069 Rheumatoid arthritis, unspecified: Secondary | ICD-10-CM | POA: Insufficient documentation

## 2021-01-18 DIAGNOSIS — E119 Type 2 diabetes mellitus without complications: Secondary | ICD-10-CM | POA: Insufficient documentation

## 2021-01-18 HISTORY — PX: IR US GUIDE VASC ACCESS RIGHT: IMG2390

## 2021-01-18 HISTORY — PX: IR ANGIOGRAM SELECTIVE EACH ADDITIONAL VESSEL: IMG667

## 2021-01-18 HISTORY — PX: IR EMBO TUMOR ORGAN ISCHEMIA INFARCT INC GUIDE ROADMAPPING: IMG5449

## 2021-01-18 HISTORY — PX: IR ANGIOGRAM VISCERAL SELECTIVE: IMG657

## 2021-01-18 LAB — URINALYSIS, ROUTINE W REFLEX MICROSCOPIC
Bilirubin Urine: NEGATIVE
Glucose, UA: NEGATIVE mg/dL
Ketones, ur: NEGATIVE mg/dL
Nitrite: NEGATIVE
Protein, ur: 30 mg/dL — AB
Specific Gravity, Urine: 1.016 (ref 1.005–1.030)
WBC, UA: >50 WBC/hpf — ABNORMAL HIGH (ref 0–5)
pH: 5 (ref 5.0–8.0)

## 2021-01-18 LAB — COMPREHENSIVE METABOLIC PANEL
ALT: 18 U/L (ref 0–44)
AST: 21 U/L (ref 15–41)
Albumin: 3.4 g/dL — ABNORMAL LOW (ref 3.5–5.0)
Alkaline Phosphatase: 41 U/L (ref 38–126)
Anion gap: 10 (ref 5–15)
BUN: 23 mg/dL (ref 8–23)
CO2: 26 mmol/L (ref 22–32)
Calcium: 9.1 mg/dL (ref 8.9–10.3)
Chloride: 105 mmol/L (ref 98–111)
Creatinine, Ser: 0.97 mg/dL (ref 0.44–1.00)
GFR, Estimated: >60 mL/min (ref >60)
Glucose, Bld: 187 mg/dL — ABNORMAL HIGH (ref 70–99)
Potassium: 3.7 mmol/L (ref 3.5–5.1)
Sodium: 141 mmol/L (ref 135–145)
Total Bilirubin: 1.2 mg/dL (ref 0.3–1.2)
Total Protein: 7.1 g/dL (ref 6.5–8.1)

## 2021-01-18 LAB — GLUCOSE, CAPILLARY: Glucose-Capillary: 175 mg/dL — ABNORMAL HIGH (ref 70–99)

## 2021-01-18 LAB — CBC WITH DIFFERENTIAL/PLATELET
Abs Immature Granulocytes: 0.05 10*3/uL (ref 0.00–0.07)
Basophils Absolute: 0 10*3/uL (ref 0.0–0.1)
Basophils Relative: 1 %
Eosinophils Absolute: 0.3 10*3/uL (ref 0.0–0.5)
Eosinophils Relative: 3 %
HCT: 37.1 % (ref 36.0–46.0)
Hemoglobin: 11.5 g/dL — ABNORMAL LOW (ref 12.0–15.0)
Immature Granulocytes: 1 %
Lymphocytes Relative: 21 %
Lymphs Abs: 1.8 10*3/uL (ref 0.7–4.0)
MCH: 28.8 pg (ref 26.0–34.0)
MCHC: 31 g/dL (ref 30.0–36.0)
MCV: 93 fL (ref 80.0–100.0)
Monocytes Absolute: 0.7 10*3/uL (ref 0.1–1.0)
Monocytes Relative: 8 %
Neutro Abs: 5.7 10*3/uL (ref 1.7–7.7)
Neutrophils Relative %: 66 %
Platelets: 263 10*3/uL (ref 150–400)
RBC: 3.99 MIL/uL (ref 3.87–5.11)
RDW: 14.6 % (ref 11.5–15.5)
WBC: 8.6 10*3/uL (ref 4.0–10.5)
nRBC: 0 % (ref 0.0–0.2)

## 2021-01-18 LAB — PROTIME-INR
INR: 1.1 (ref 0.8–1.2)
Prothrombin Time: 14.3 seconds (ref 11.4–15.2)

## 2021-01-18 MED ORDER — IOHEXOL 300 MG/ML  SOLN: 100.0000 mL | Freq: Once | INTRAMUSCULAR | Status: DC | PRN

## 2021-01-18 MED ORDER — PANTOPRAZOLE SODIUM 40 MG IV SOLR
INTRAVENOUS | Status: AC
  Filled 2021-01-18: qty 40

## 2021-01-18 MED ORDER — ONDANSETRON HCL 4 MG/2ML IJ SOLN
INTRAMUSCULAR | Status: AC
  Filled 2021-01-18: qty 4

## 2021-01-18 MED ORDER — MIDAZOLAM HCL 2 MG/2ML IJ SOLN
INTRAMUSCULAR | Status: DC | PRN
  Administered 2021-01-18 (×2): 0.5 mg via INTRAVENOUS
  Administered 2021-01-18: 1 mg via INTRAVENOUS

## 2021-01-18 MED ORDER — SODIUM CHLORIDE 0.9 % IV SOLN
INTRAVENOUS | Status: DC
Start: 2021-01-18 — End: 2021-01-19

## 2021-01-18 MED ORDER — ONDANSETRON HCL 4 MG/2ML IJ SOLN
INTRAMUSCULAR | Status: DC | PRN
  Administered 2021-01-18 (×2): 4 mg via INTRAVENOUS

## 2021-01-18 MED ORDER — PANTOPRAZOLE SODIUM 40 MG IV SOLR
40.0000 mg | Freq: Once | INTRAVENOUS | Status: AC
  Administered 2021-01-18: 40 mg via INTRAVENOUS

## 2021-01-18 MED ORDER — FENTANYL CITRATE (PF) 100 MCG/2ML IJ SOLN
INTRAMUSCULAR | Status: DC | PRN
  Administered 2021-01-18 (×2): 25 ug via INTRAVENOUS
  Administered 2021-01-18: 50 ug via INTRAVENOUS

## 2021-01-18 MED ORDER — LIDOCAINE HCL 1 % IJ SOLN
INTRAMUSCULAR | Status: AC
  Filled 2021-01-18: qty 20

## 2021-01-18 MED ORDER — YTTRIUM 90 INJECTION
24.6300 | INJECTION | Freq: Once | INTRAVENOUS | Status: AC | PRN
  Administered 2021-01-18: 24.63 via INTRAVENOUS

## 2021-01-18 MED ORDER — CEPHALEXIN 500 MG PO CAPS: 500.0000 mg | ORAL_CAPSULE | Freq: Two times a day (BID) | ORAL | 0 refills | Status: DC

## 2021-01-18 MED ORDER — PIPERACILLIN-TAZOBACTAM 3.375 G IVPB 30 MIN
3.3750 g | Freq: Once | INTRAVENOUS | Status: AC
  Administered 2021-01-18: 3.375 g via INTRAVENOUS
  Filled 2021-01-18: qty 50

## 2021-01-18 MED ORDER — MIDAZOLAM HCL 2 MG/2ML IJ SOLN
INTRAMUSCULAR | Status: AC
  Filled 2021-01-18: qty 6

## 2021-01-18 MED ORDER — SODIUM CHLORIDE 0.9 % IV SOLN
8.0000 mg | Freq: Once | INTRAVENOUS | Status: DC
  Filled 2021-01-18: qty 4

## 2021-01-18 MED ORDER — DEXAMETHASONE SODIUM PHOSPHATE 10 MG/ML IJ SOLN
8.0000 mg | Freq: Once | INTRAMUSCULAR | Status: AC
  Administered 2021-01-18: 8 mg via INTRAVENOUS

## 2021-01-18 MED ORDER — PIPERACILLIN-TAZOBACTAM 3.375 G IVPB
INTRAVENOUS | Status: AC
  Filled 2021-01-18: qty 50

## 2021-01-18 MED ORDER — FENTANYL CITRATE (PF) 100 MCG/2ML IJ SOLN
INTRAMUSCULAR | Status: AC
  Filled 2021-01-18: qty 6

## 2021-01-18 MED ORDER — DEXAMETHASONE SODIUM PHOSPHATE 10 MG/ML IJ SOLN
INTRAMUSCULAR | Status: AC
  Filled 2021-01-18: qty 1

## 2021-01-18 NOTE — Progress Notes (Signed)
Patient ID: Wanda Curry, female   DOB: 03-05-1946, 75 y.o.   MRN: [id number] Patient's UA today shows large leukocytes with greater than 50 WBC.  Per Dr. Pascal Lux patient should be treated for UTI.  Phone call made to patient's PCP who will call in antibiotic prescription today.  Patient updated with plans.

## 2021-01-18 NOTE — Discharge Instructions (Signed)
For questions /concerns may call Interventional Radiology at [phone]  You may remove your dressing and shower tomorrow afternoon    Post Y-90 Radioembolization Discharge Instructions  You have been given a radioactive material during your procedure.  While it is safe for you to be discharged home from the hospital, you need to proceed directly home.    Do not use public transportation, including air travel, lasting more than 2 hours for 1 week.  Avoid crowded public places for 1 week.  Adult visitors should try to avoid close contact with you for 1 week.    Children and pregnant females should not visit or have close contact with you for 1 week.  Items that you touch are not radioactive.  Do not sleep in the same bed as your partner for 1 week, and a condom should be used for sexual activity during the first 24 hours.  Your blood may be radioactive and caution should be used if any bleeding occurs during the recovery period.  Body fluids may be radioactive for 24 hours.  Wash your hands after voiding.  Men should sit to urinate.  Dispose of any soiled materials (flush down toilet or place in trash at home) during the first day.  Drink 6 to 8 glasses of fluids per day for 5 days to hydrate yourself.  If you need to see a doctor during the first week, you must let them know that you were treated with yttrium-90 microspheres, and will be slightly radioactive.  They can call Interventional Radiology [phone] with any questions   .Moderate Conscious Sedation, Adult, Care After This sheet gives you information about how to care for yourself after your procedure. Your health care provider may also give you more specific instructions. If you have problems or questions, contact your health care provider. What can I expect after the procedure? After the procedure, it is common to have:  Sleepiness for several hours.  Impaired judgment for several hours.  Difficulty with  balance.  Vomiting if you eat too soon. Follow these instructions at home: For the time period you were told by your health care provider:  Rest.  Do not participate in activities where you could fall or become injured.  Do not drive or use machinery.  Do not drink alcohol.  Do not take sleeping pills or medicines that cause drowsiness.  Do not make important decisions or sign legal documents.  Do not take care of children on your own.      Eating and drinking  Follow the diet recommended by your health care provider.  Drink enough fluid to keep your urine pale yellow.  If you vomit: ? Drink water, juice, or soup when you can drink without vomiting. ? Make sure you have little or no nausea before eating solid foods.   General instructions  Take over-the-counter and prescription medicines only as told by your health care provider.  Have a responsible adult stay with you for the time you are told. It is important to have someone help care for you until you are awake and alert.  Do not smoke.  Keep all follow-up visits as told by your health care provider. This is important. Contact a health care provider if:  You are still sleepy or having trouble with balance after 24 hours.  You feel light-headed.  You keep feeling nauseous or you keep vomiting.  You develop a rash.  You have a fever.  You have redness or swelling around the IV  site. Get help right away if:  You have trouble breathing.  You have new-onset confusion at home. Summary  After the procedure, it is common to feel sleepy, have impaired judgment, or feel nauseous if you eat too soon.  Rest after you get home. Know the things you should not do after the procedure.  Follow the diet recommended by your health care provider and drink enough fluid to keep your urine pale yellow.  Get help right away if you have trouble breathing or new-onset confusion at home. This information is not intended to  replace advice given to you by your health care provider. Make sure you discuss any questions you have with your health care provider. Document Revised: 12/02/2019 Document Reviewed: 06/30/2019 Elsevier Patient Education  2021 [employer].

## 2021-01-18 NOTE — H&P (Signed)
Referring Physician(s): Katragadda,S  Supervising Physician: Sandi Mariscal  Patient Status:  [facility]  Chief Complaint:  Multifocal hepatocellular carcinoma  Subjective: 75 y.o. female with a past medical history significant for fibromyalgia, RA, HLD, DM, colon cancer and recently diagnosed hepatocellular carcinoma s/p bland hepatic embolization in IR on 07/24/20 . Wanda Curry has been followed by IR since 2021 when she underwent a bland hepatic embolization, she was noted to have a new hypervascular liver lesion within the lateral segment of the left lobe of the liver and biopsy on 11/29/20 confirmed multifocal well to moderately differentiated [place]. She met with Dr. Pascal Lux on 12/12/20 to discuss further disease management and ultimately decision was made to proceed with Y90 left hepatic radioembolization with palliative intent and she presents today for the procedure.  She currently denies fever, headache, chest pain, dyspnea, cough, abdominal pain, back pain, nausea, vomiting or bleeding.  She does have some burning with urination.  Past Medical History:  Diagnosis Date  . Anemia   . Arthritis    RA  . Cancer ([place])    skin cancer  . Depression   . Diabetes mellitus without complication ([place])   . Fibromyalgia   . GERD (gastroesophageal reflux disease)   . Heart murmur    ECHO scheduled 05-25-2015  . Hyperlipidemia   . Neuropathy   . PONV (postoperative nausea and vomiting)    Past Surgical History:  Procedure Laterality Date  . ABDOMINAL HYSTERECTOMY    . BIOPSY  09/23/2016   Procedure: BIOPSY;  Surgeon: Danie Binder, MD;  [facility];  Service: Endoscopy;;  hepatic flexure mass  . CHOLECYSTECTOMY    . COLONOSCOPY N/A 08/13/2017   Procedure: COLONOSCOPY;  Surgeon: Daneil Dolin, MD;  [facility];  Service: Endoscopy;  Laterality: N/A;  . COLONOSCOPY WITH PROPOFOL N/A 09/23/2016   Procedure: COLONOSCOPY WITH PROPOFOL;  Surgeon: Danie Binder, MD;   [facility];  Service: Endoscopy;  Laterality: N/A;  7:30 am  . HARDWARE REMOVAL Right 05/30/2015   Procedure: HARDWARE REMOVAL;  Surgeon: Ninetta Lights, MD;  Location: [place];  Service: Orthopedics;  Laterality: Right;  . IR ANGIOGRAM SELECTIVE EACH ADDITIONAL VESSEL  07/25/2019  . IR ANGIOGRAM SELECTIVE EACH ADDITIONAL VESSEL  07/25/2019  . IR ANGIOGRAM SELECTIVE EACH ADDITIONAL VESSEL  01/02/2021  . IR ANGIOGRAM SELECTIVE EACH ADDITIONAL VESSEL  01/02/2021  . IR ANGIOGRAM SELECTIVE EACH ADDITIONAL VESSEL  01/02/2021  . IR ANGIOGRAM VISCERAL SELECTIVE  07/25/2019  . IR ANGIOGRAM VISCERAL SELECTIVE  07/25/2019  . IR ANGIOGRAM VISCERAL SELECTIVE  01/02/2021  . IR ANGIOGRAM VISCERAL SELECTIVE  01/02/2021  . IR EMBO ARTERIAL NOT HEMORR HEMANG INC GUIDE ROADMAPPING  01/02/2021  . IR EMBO TUMOR ORGAN ISCHEMIA INFARCT INC GUIDE ROADMAPPING  07/25/2019  . IR RADIOLOGIST EVAL & MGMT  06/09/2019  . IR RADIOLOGIST EVAL & MGMT  07/07/2019  . IR RADIOLOGIST EVAL & MGMT  01/05/2020  . IR RADIOLOGIST EVAL & MGMT  04/12/2020  . IR RADIOLOGIST EVAL & MGMT  09/27/2020  . IR RADIOLOGIST EVAL & MGMT  10/31/2020  . IR RADIOLOGIST EVAL & MGMT  12/12/2020  . IR US GUIDE VASC ACCESS RIGHT  07/25/2019  . IR US GUIDE VASC ACCESS RIGHT  01/02/2021  . PARTIAL COLECTOMY N/A 10/10/2016   Procedure: PARTIAL COLECTOMY;  Surgeon: Aviva Signs, MD;  Location: [facility];  Service: General;  Laterality: N/A;  . planter warts Bilateral    both feet  .  POLYPECTOMY  09/23/2016   Procedure: POLYPECTOMY;  Surgeon: Danie Binder, MD;  [facility];  Service: Endoscopy;;  transverse colon polyps times 2, rectal polyp  . skin grafts     due to planter warts  . TOTAL KNEE ARTHROPLASTY Right 05/30/2015   Procedure: RIGHT TOTAL KNEE ARTHROPLASTY;  Surgeon: Ninetta Lights, MD;  Location: [place] [facility];  Service: Orthopedics;  Laterality: Right;      Allergies: Xeloda [capecitabine], Adhesive [tape], Neosporin original  [bacitracin-neomycin-polymyxin], Sulfa antibiotics, and Erythromycin  Medications: Prior to Admission medications   Medication Sig Start Date End Date Taking? Authorizing Provider  acetaminophen (TYLENOL) 500 MG tablet Take 500 mg by mouth every 6 (six) hours as needed (for pain.).    [provider]  APPLE CARCINOMA VINEGAR PO Take 450 mg by mouth daily.    [provider]  Calcium Carbonate-Vitamin D3 600-400 MG-UNIT TABS Take 1 tablet by mouth daily.    [provider]  carvedilol (COREG) 6.25 MG tablet Take 6.25 mg by mouth 2 (two) times daily with a meal.    [provider]  folic acid (FOLVITE) 1 MG tablet Take 1 mg by mouth daily.    [provider]  furosemide (LASIX) 40 MG tablet Take 20 mg by mouth daily as needed for edema. 07/24/19   [provider]  loperamide (IMODIUM) 2 MG capsule Take 2 mg by mouth daily.    [provider]  magnesium gluconate (MAGONATE) 500 MG tablet Take 500 mg by mouth daily as needed (cramps).    [provider]  methotrexate (RHEUMATREX) 2.5 MG tablet Take 20 mg by mouth every Sunday. Caution:Chemotherapy. Protect from light.    [provider]  Multiple Vitamins-Minerals (CENTRUM SILVER PO) Take 1 tablet by mouth daily.    [provider]  NOVOLOG MIX 70/30 FLEXPEN (70-30) 100 UNIT/ML FlexPen Inject 20-30 Units into the skin See admin instructions. Take 30 units in the am and Take 20 units in the evening 09/14/16   [provider]  omeprazole (PRILOSEC) 40 MG capsule Take 40 mg by mouth daily.  11/17/16   [provider]  rivaroxaban (XARELTO) 20 MG TABS tablet Take 1 tablet (20 mg total) by mouth daily with supper. 11/21/20   Derek Jack, MD  rOPINIRole (REQUIP) 1 MG tablet Take 1 mg by mouth every morning. MAY TAKE 2 ADDITIONAL DOSES  AS NEEDED    [provider]  Saline (AYR SALINE NASAL DROPS) 0.65 % SOLN Place 1 spray into the nose  daily as needed (congestion).    [provider]  vitamin E 180 MG (400 UNITS) capsule Take 400 Units by mouth daily.    [provider]     Vital Signs: BP (!) 157/61   Pulse 73   Temp 98.2 F (36.8 C) (Oral)   Resp 18   SpO2 100%   Physical Exam awake, alert.  Chest clear to auscultation bilaterally.  Heart with regular rate and rhythm.  Abdomen soft, positive bowel sounds, nontender.  Bilateral lower extremity edema noted.  Imaging: No results found.  Labs:  CBC: Recent Labs    12/03/20 1957 12/04/20 0102 12/04/20 0847 01/02/21 0820  WBC 7.6 6.8 6.6 8.9  HGB 9.3* 8.4* 8.4* 10.7*  HCT 28.9* 25.9* 26.1* 34.1*  PLT 190 162 173 251    COAGS: Recent Labs    11/29/20 1129 12/01/20 0929 01/02/21 0820  INR 1.0 1.1 1.2    BMP: Recent Labs  04/02/20 0909 09/19/20 1032 12/02/20 0216 12/03/20 0132 12/04/20 0102 01/02/21 0820  NA 139   < > 138 138 135 136  K 4.8   < > 4.2 4.3 4.1 4.0  CL 104   < > 108 107 104 102  CO2 26   < > _0 GLUCOSE 198*   < > 175* 225* 162* 245*  BUN 20   < > _1 CALCIUM 9.5   < > 8.1* 8.5* 8.3* 8.8*  CREATININE 1.14*   < > 1.06* 1.30* 0.99 0.90  GFRNONAA 47*   < > 54* 43* 59* >60  GFRAA 54*  --   --   --   --   --    < > = values in this interval not displayed.    LIVER FUNCTION TESTS: Recent Labs    12/02/20 0216 12/03/20 0132 12/04/20 0102 01/02/21 0820  BILITOT 0.5 0.7 0.8 0.8  AST 35 27 40 19  ALT 52* 39 45* 20  ALKPHOS 33* 33* 37* 37*  PROT 5.1* 5.1* 5.1* 6.4*  ALBUMIN 2.4* 2.5* 2.4* 3.1*    Assessment and Plan: 75 y.o. female with a past medical history significant for fibromyalgia, RA, HLD, DM, colon cancer and recently diagnosed hepatocellular carcinoma s/p bland hepatic embolization in IR on 07/24/20 . Wanda Curry has been followed by IR since 2021 when she underwent a bland hepatic embolization, she was noted to have a new hypervascular liver lesion within the lateral segment  of the left lobe of the liver and biopsy on 11/29/20 confirmed multifocal well to moderately differentiated [place]. She met with Dr. Pascal Lux on 12/12/20 to discuss further disease management and ultimately decision was made to proceed with Y90 left hepatic radioembolization with palliative intent and she presents today for the procedure.  She currently denies fever, headache, chest pain, dyspnea, cough, abdominal pain, back pain, nausea, vomiting or bleeding.  She does have some burning with urination.Will check UA. Risks and benefits of procedure were discussed with the patient including, but not limited to bleeding, infection, vascular injury or contrast induced renal failure.  This interventional procedure involves the use of X-rays and because of the nature of the planned procedure, it is possible that we will have prolonged use of X-ray fluoroscopy.  Potential radiation risks to you include (but are not limited to) the following: - A slightly elevated risk for cancer  several years later in life. This risk is typically less than 0.5% percent. This risk is low in comparison to the normal incidence of human cancer, which is 33% for women and 50% for men according to the [employer]. - Radiation induced injury can include skin redness, resembling a rash, tissue breakdown / ulcers and hair loss (which can be temporary or permanent).   The likelihood of either of these occurring depends on the difficulty of the procedure and whether you are sensitive to radiation due to previous procedures, disease, or genetic conditions.   IF your procedure requires a prolonged use of radiation, you will be notified and given written instructions for further action.  It is your responsibility to monitor the irradiated area for the 2 weeks following the procedure and to notify your physician if you are concerned that you have suffered a radiation induced injury.    All of the patient's questions were answered,  patient is agreeable to proceed.  Consent signed and in chart.      Electronically Signed: D.  Rowe Robert, PA-C 01/18/2021, 8:59 AM   I spent a total of 25 minutes at the the patient's bedside AND on the patient's hospital floor or unit, greater than 50% of which was counseling/coordinating care for mesenteric/visceral/hepatic arteriogram with left hepatic Y 90 radioembolization

## 2021-01-18 NOTE — Progress Notes (Signed)
Patient ID: Wanda Curry, female   DOB: [birth year], 75 y.o.   MRN: [id number] Pt informed our service this evening that prescription for antibiotic for UTI was not called into her pharmacy by her PCP. We have therefore sent an electronic prescription for keflex 500 mg bid x 7days to her pharmacy. Pt was told to f/u with her PCP should symptoms not resolve.

## 2021-01-18 NOTE — Discharge Instructions (Signed)
Post Y-90 Radioembolization Discharge Instructions  You have been given a radioactive material during your procedure.  While it is safe for you to be discharged home from the hospital, you need to proceed directly home.    Do not use public transportation, including air travel, lasting more than 2 hours for 1 week.  Avoid crowded public places for 1 week.  Adult visitors should try to avoid close contact with you for 1 week.    Children and pregnant females should not visit or have close contact with you for 1 week.  Items that you touch are not radioactive.  Do not sleep in the same bed as your partner for 1 week, and a condom should be used for sexual activity during the first 24 hours.  Your blood may be radioactive and caution should be used if any bleeding occurs during the recovery period.  Body fluids may be radioactive for 24 hours.  Wash your hands after voiding.  Men should sit to urinate.  Dispose of any soiled materials (flush down toilet or place in trash at home) during the first day.  Drink 6 to 8 glasses of fluids per day for 5 days to hydrate yourself.  If you need to see a doctor during the first week, you must let them know that you were treated with yttrium-90 microspheres, and will be slightly radioactive.  They can call Interventional Radiology [phone] with any questions.Moderate Conscious Sedation, Adult, Care After This sheet gives you information about how to care for yourself after your procedure. Your health care provider may also give you more specific instructions. If you have problems or questions, contact your health care provider. What can I expect after the procedure? After the procedure, it is common to have:  Sleepiness for several hours.  Impaired judgment for several hours.  Difficulty with balance.  Vomiting if you eat too soon. Follow these instructions at home: For the time period you were told by your health care provider:  Rest.  Do  not participate in activities where you could fall or become injured.  Do not drive or use machinery.  Do not drink alcohol.  Do not take sleeping pills or medicines that cause drowsiness.  Do not make important decisions or sign legal documents.  Do not take care of children on your own.      Eating and drinking  Follow the diet recommended by your health care provider.  Drink enough fluid to keep your urine pale yellow.  If you vomit: ? Drink water, juice, or soup when you can drink without vomiting. ? Make sure you have little or no nausea before eating solid foods.   General instructions  Take over-the-counter and prescription medicines only as told by your health care provider.  Have a responsible adult stay with you for the time you are told. It is important to have someone help care for you until you are awake and alert.  Do not smoke.  Keep all follow-up visits as told by your health care provider. This is important. Contact a health care provider if:  You are still sleepy or having trouble with balance after 24 hours.  You feel light-headed.  You keep feeling nauseous or you keep vomiting.  You develop a rash.  You have a fever.  You have redness or swelling around the IV site. Get help right away if:  You have trouble breathing.  You have new-onset confusion at home. Summary  After the procedure, it is  common to feel sleepy, have impaired judgment, or feel nauseous if you eat too soon.  Rest after you get home. Know the things you should not do after the procedure.  Follow the diet recommended by your health care provider and drink enough fluid to keep your urine pale yellow.  Get help right away if you have trouble breathing or new-onset confusion at home. This information is not intended to replace advice given to you by your health care provider. Make sure you discuss any questions you have with your health care provider. Document Revised:  12/02/2019 Document Reviewed: 06/30/2019 Elsevier Patient Education  2021 [employer].

## 2021-01-18 NOTE — Procedures (Signed)
Pre-procedure Diagnosis: Multifocal HCC Post-procedure Diagnosis: Same  Post Y-90 radioembolization of the left lobe of the liver.    Complications: None Immediate  EBL: None  Keep right leg straight for 4 hrs (until 1600)  Signed: Sandi Mariscal Pager: [phone] 01/18/2021, 12:05 PM

## 2021-01-19 LAB — AFP TUMOR MARKER: AFP, Serum, Tumor Marker: 891 ng/mL — ABNORMAL HIGH (ref 0.0–9.2)

## 2021-01-19 LAB — CEA: CEA: 3.4 ng/mL (ref 0.0–4.7)

## 2021-02-14 ENCOUNTER — Other Ambulatory Visit: Payer: Self-pay | Admitting: Student

## 2021-02-15 ENCOUNTER — Encounter (HOSPITAL_COMMUNITY): Payer: Self-pay

## 2021-02-15 ENCOUNTER — Encounter (HOSPITAL_COMMUNITY)
Admission: RE | Admit: 2021-02-15 | Discharge: 2021-02-15 | Disposition: A | Discharge status: Home / Self Care | Payer: Medicare Other | Source: Ambulatory Visit | Attending: Interventional Radiology | Admitting: Interventional Radiology

## 2021-02-15 ENCOUNTER — Other Ambulatory Visit (HOSPITAL_COMMUNITY): Payer: Self-pay | Admitting: Interventional Radiology

## 2021-02-15 ENCOUNTER — Ambulatory Visit (HOSPITAL_COMMUNITY)
Admission: RE | Admit: 2021-02-15 | Discharge: 2021-02-15 | Disposition: A | Discharge status: Home / Self Care | Payer: Medicare Other | Source: Ambulatory Visit | Attending: Interventional Radiology | Admitting: Interventional Radiology

## 2021-02-15 ENCOUNTER — Other Ambulatory Visit: Payer: Self-pay

## 2021-02-15 DIAGNOSIS — C189 Malignant neoplasm of colon, unspecified: Secondary | ICD-10-CM | POA: Insufficient documentation

## 2021-02-15 DIAGNOSIS — Z794 Long term (current) use of insulin: Secondary | ICD-10-CM | POA: Insufficient documentation

## 2021-02-15 DIAGNOSIS — C787 Secondary malignant neoplasm of liver and intrahepatic bile duct: Secondary | ICD-10-CM | POA: Insufficient documentation

## 2021-02-15 DIAGNOSIS — Z79899 Other long term (current) drug therapy: Secondary | ICD-10-CM | POA: Insufficient documentation

## 2021-02-15 DIAGNOSIS — Z882 Allergy status to sulfonamides status: Secondary | ICD-10-CM | POA: Insufficient documentation

## 2021-02-15 DIAGNOSIS — E119 Type 2 diabetes mellitus without complications: Secondary | ICD-10-CM | POA: Diagnosis not present

## 2021-02-15 DIAGNOSIS — E785 Hyperlipidemia, unspecified: Secondary | ICD-10-CM | POA: Diagnosis not present

## 2021-02-15 DIAGNOSIS — Z7901 Long term (current) use of anticoagulants: Secondary | ICD-10-CM | POA: Diagnosis not present

## 2021-02-15 DIAGNOSIS — M069 Rheumatoid arthritis, unspecified: Secondary | ICD-10-CM | POA: Diagnosis not present

## 2021-02-15 DIAGNOSIS — M797 Fibromyalgia: Secondary | ICD-10-CM | POA: Diagnosis not present

## 2021-02-15 DIAGNOSIS — Z888 Allergy status to other drugs, medicaments and biological substances status: Secondary | ICD-10-CM | POA: Insufficient documentation

## 2021-02-15 HISTORY — PX: IR ANGIOGRAM SELECTIVE EACH ADDITIONAL VESSEL: IMG667

## 2021-02-15 HISTORY — DX: Dyspnea, unspecified: R06.00

## 2021-02-15 HISTORY — DX: Puncture wound without foreign body of unspecified great toe without damage to nail, initial encounter: S91.133A

## 2021-02-15 HISTORY — PX: IR US GUIDE VASC ACCESS RIGHT: IMG2390

## 2021-02-15 HISTORY — PX: IR ANGIOGRAM VISCERAL SELECTIVE: IMG657

## 2021-02-15 HISTORY — PX: IR EMBO TUMOR ORGAN ISCHEMIA INFARCT INC GUIDE ROADMAPPING: IMG5449

## 2021-02-15 LAB — CBC WITH DIFFERENTIAL/PLATELET
Abs Immature Granulocytes: 0.03 10*3/uL (ref 0.00–0.07)
Basophils Absolute: 0 10*3/uL (ref 0.0–0.1)
Basophils Relative: 1 %
Eosinophils Absolute: 0.2 10*3/uL (ref 0.0–0.5)
Eosinophils Relative: 4 %
HCT: 39.8 % (ref 36.0–46.0)
Hemoglobin: 12.5 g/dL (ref 12.0–15.0)
Immature Granulocytes: 1 %
Lymphocytes Relative: 25 %
Lymphs Abs: 1.6 10*3/uL (ref 0.7–4.0)
MCH: 28.9 pg (ref 26.0–34.0)
MCHC: 31.4 g/dL (ref 30.0–36.0)
MCV: 92.1 fL (ref 80.0–100.0)
Monocytes Absolute: 0.7 10*3/uL (ref 0.1–1.0)
Monocytes Relative: 11 %
Neutro Abs: 3.9 10*3/uL (ref 1.7–7.7)
Neutrophils Relative %: 58 %
Platelets: 188 10*3/uL (ref 150–400)
RBC: 4.32 MIL/uL (ref 3.87–5.11)
RDW: 14.8 % (ref 11.5–15.5)
WBC: 6.5 10*3/uL (ref 4.0–10.5)
nRBC: 0 % (ref 0.0–0.2)

## 2021-02-15 LAB — PROTIME-INR
INR: 1.2 (ref 0.8–1.2)
Prothrombin Time: 15.6 seconds — ABNORMAL HIGH (ref 11.4–15.2)

## 2021-02-15 LAB — COMPREHENSIVE METABOLIC PANEL
ALT: 17 U/L (ref 0–44)
AST: 23 U/L (ref 15–41)
Albumin: 3.4 g/dL — ABNORMAL LOW (ref 3.5–5.0)
Alkaline Phosphatase: 41 U/L (ref 38–126)
Anion gap: 10 (ref 5–15)
BUN: 19 mg/dL (ref 8–23)
CO2: 25 mmol/L (ref 22–32)
Calcium: 9.1 mg/dL (ref 8.9–10.3)
Chloride: 103 mmol/L (ref 98–111)
Creatinine, Ser: 1.01 mg/dL — ABNORMAL HIGH (ref 0.44–1.00)
GFR, Estimated: 58 mL/min — ABNORMAL LOW (ref >60)
Glucose, Bld: 172 mg/dL — ABNORMAL HIGH (ref 70–99)
Potassium: 4.5 mmol/L (ref 3.5–5.1)
Sodium: 138 mmol/L (ref 135–145)
Total Bilirubin: 0.6 mg/dL (ref 0.3–1.2)
Total Protein: 6.6 g/dL (ref 6.5–8.1)

## 2021-02-15 LAB — GLUCOSE, CAPILLARY: Glucose-Capillary: 189 mg/dL — ABNORMAL HIGH (ref 70–99)

## 2021-02-15 MED ORDER — DEXAMETHASONE SODIUM PHOSPHATE 10 MG/ML IJ SOLN
8.0000 mg | Freq: Once | INTRAMUSCULAR | Status: AC
  Administered 2021-02-15: 8 mg via INTRAVENOUS
  Filled 2021-02-15: qty 1

## 2021-02-15 MED ORDER — IOHEXOL 300 MG/ML  SOLN
50.0000 mL | Freq: Once | INTRAMUSCULAR | Status: AC | PRN
  Administered 2021-02-15: 25 mL via INTRA_ARTERIAL

## 2021-02-15 MED ORDER — LIDOCAINE HCL 1 % IJ SOLN
INTRAMUSCULAR | Status: AC
  Filled 2021-02-15: qty 20

## 2021-02-15 MED ORDER — MIDAZOLAM HCL 2 MG/2ML IJ SOLN
INTRAMUSCULAR | Status: AC | PRN
  Administered 2021-02-15 (×2): 0.5 mg via INTRAVENOUS
  Administered 2021-02-15: 1 mg via INTRAVENOUS

## 2021-02-15 MED ORDER — IOHEXOL 300 MG/ML  SOLN
100.0000 mL | Freq: Once | INTRAMUSCULAR | Status: AC | PRN
  Administered 2021-02-15: 25 mL via INTRA_ARTERIAL

## 2021-02-15 MED ORDER — SODIUM CHLORIDE 0.9 % IV SOLN
8.0000 mg | Freq: Once | INTRAVENOUS | Status: AC
  Administered 2021-02-15: 8 mg via INTRAVENOUS
  Filled 2021-02-15: qty 8

## 2021-02-15 MED ORDER — FENTANYL CITRATE (PF) 100 MCG/2ML IJ SOLN
INTRAMUSCULAR | Status: AC
  Filled 2021-02-15: qty 2

## 2021-02-15 MED ORDER — FENTANYL CITRATE (PF) 100 MCG/2ML IJ SOLN
INTRAMUSCULAR | Status: AC | PRN
  Administered 2021-02-15 (×2): 25 ug via INTRAVENOUS
  Administered 2021-02-15: 50 ug via INTRAVENOUS

## 2021-02-15 MED ORDER — SODIUM CHLORIDE 0.9 % IV SOLN: INTRAVENOUS | Status: DC

## 2021-02-15 MED ORDER — PIPERACILLIN-TAZOBACTAM 3.375 G IVPB 30 MIN
3.3750 g | Freq: Once | INTRAVENOUS | Status: AC
  Administered 2021-02-15: 3.375 g via INTRAVENOUS
  Filled 2021-02-15 (×2): qty 50

## 2021-02-15 MED ORDER — YTTRIUM 90 INJECTION
32.1100 | INJECTION | Freq: Once | INTRAVENOUS | Status: AC | PRN
  Administered 2021-02-15: 33.63 via INTRA_ARTERIAL

## 2021-02-15 MED ORDER — LIDOCAINE HCL 1 % IJ SOLN
INTRAMUSCULAR | Status: AC | PRN
  Administered 2021-02-15 (×2): 5 mL

## 2021-02-15 MED ORDER — MIDAZOLAM HCL 2 MG/2ML IJ SOLN
INTRAMUSCULAR | Status: AC
  Filled 2021-02-15: qty 4

## 2021-02-15 MED ORDER — PANTOPRAZOLE SODIUM 40 MG IV SOLR
40.0000 mg | Freq: Once | INTRAVENOUS | Status: AC
  Administered 2021-02-15: 40 mg via INTRAVENOUS
  Filled 2021-02-15: qty 40

## 2021-02-15 NOTE — Discharge Instructions (Addendum)
Urgent needs - Interventional Radiology on call MD [phone]  Wound - May remove dressing and shower in 24 to 48 hours.  Keep site clean and dry.  Replace with bandaid as needed.  Do not submerge in tub or water until site healing well. If closed with glue, glue will flake off on its own.  May restart Xarelto back tomorrow evening 02-16-21

## 2021-02-15 NOTE — Procedures (Signed)
Pre-procedure Diagnosis: Multifocal HCC Post-procedure Diagnosis: Same  Post Y-90 radioembolization of the right lobe of the liver.    Complications: None Immediate EBL: None  Keep right leg straight for 4 hrs (until 1500).    Signed: Sandi Mariscal Pager: [phone] 02/15/2021, 11:32 AM

## 2021-02-15 NOTE — H&P (Signed)
Referring Physician(s): Katragadda,S  Supervising Physician: Sandi Mariscal  Patient Status:  [facility]  Chief Complaint:  Multifocal hepatocellular carcinoma  Subjective: 75 y.o. female with a past medical history significant for fibromyalgia, RA, HLD, DM, colon cancer and recently diagnosed hepatocellular carcinoma s/p bland hepatic embolization in IR on 07/24/20 . Ms. York has been followed by IR since 2021 when she underwent a bland hepatic embolization, she was noted to have a new hypervascular liver lesion within the lateral segment of the left lobe of the liver and biopsy on 11/29/20 confirmed multifocal well to moderately differentiated [place]. She met with Dr. Pascal Lux on 12/12/20 to discuss further disease management and ultimately decision was made to proceed with Y90 right and left hepatic radioembolization with palliative intent. She underwent treatment of the left side on 6/3 and returns today for treatment to the right hepatic lesions. She currently denies fever, headache, chest pain, dyspnea, cough, abdominal pain, back pain, nausea, vomiting or bleeding.  She does have some burning with urination.  Past Medical History:  Diagnosis Date   Anemia    Arthritis    RA   Cancer ([place])    skin cancer   Depression    Diabetes mellitus without complication (HCC)    Fibromyalgia    GERD (gastroesophageal reflux disease)    Heart murmur    ECHO scheduled 05-25-2015   Hyperlipidemia    Neuropathy    PONV (postoperative nausea and vomiting)    Past Surgical History:  Procedure Laterality Date   ABDOMINAL HYSTERECTOMY     BIOPSY  09/23/2016   Procedure: BIOPSY;  Surgeon: Danie Binder, MD;  [facility];  Service: Endoscopy;;  hepatic flexure mass   CHOLECYSTECTOMY     COLONOSCOPY N/A 08/13/2017   Procedure: COLONOSCOPY;  Surgeon: Daneil Dolin, MD;  [facility];  Service: Endoscopy;  Laterality: N/A;   COLONOSCOPY WITH PROPOFOL N/A 09/23/2016   Procedure:  COLONOSCOPY WITH PROPOFOL;  Surgeon: Danie Binder, MD;  [facility];  Service: Endoscopy;  Laterality: N/A;  7:30 am   HARDWARE REMOVAL Right 05/30/2015   Procedure: HARDWARE REMOVAL;  Surgeon: Ninetta Lights, MD;  Location: [place];  Service: Orthopedics;  Laterality: Right;   IR ANGIOGRAM SELECTIVE EACH ADDITIONAL VESSEL  07/25/2019   IR ANGIOGRAM SELECTIVE EACH ADDITIONAL VESSEL  07/25/2019   IR ANGIOGRAM SELECTIVE EACH ADDITIONAL VESSEL  01/02/2021   IR ANGIOGRAM SELECTIVE EACH ADDITIONAL VESSEL  01/02/2021   IR ANGIOGRAM SELECTIVE EACH ADDITIONAL VESSEL  01/02/2021   IR ANGIOGRAM SELECTIVE EACH ADDITIONAL VESSEL  01/18/2021   IR ANGIOGRAM VISCERAL SELECTIVE  07/25/2019   IR ANGIOGRAM VISCERAL SELECTIVE  07/25/2019   IR ANGIOGRAM VISCERAL SELECTIVE  01/02/2021   IR ANGIOGRAM VISCERAL SELECTIVE  01/02/2021   IR ANGIOGRAM VISCERAL SELECTIVE  01/18/2021   IR EMBO ARTERIAL NOT HEMORR HEMANG INC GUIDE ROADMAPPING  01/02/2021   IR EMBO TUMOR ORGAN ISCHEMIA INFARCT INC GUIDE ROADMAPPING  07/25/2019   IR EMBO TUMOR ORGAN ISCHEMIA INFARCT INC GUIDE ROADMAPPING  01/18/2021   IR RADIOLOGIST EVAL & MGMT  06/09/2019   IR RADIOLOGIST EVAL & MGMT  07/07/2019   IR RADIOLOGIST EVAL & MGMT  01/05/2020   IR RADIOLOGIST EVAL & MGMT  04/12/2020   IR RADIOLOGIST EVAL & MGMT  09/27/2020   IR RADIOLOGIST EVAL & MGMT  10/31/2020   IR RADIOLOGIST EVAL & MGMT  12/12/2020   IR US GUIDE VASC ACCESS RIGHT  07/25/2019  IR US GUIDE VASC ACCESS RIGHT  01/02/2021   IR US GUIDE VASC ACCESS RIGHT  01/18/2021   PARTIAL COLECTOMY N/A 10/10/2016   Procedure: PARTIAL COLECTOMY;  Surgeon: Aviva Signs, MD;  [facility];  Service: General;  Laterality: N/A;   planter warts Bilateral    both feet   POLYPECTOMY  09/23/2016   Procedure: POLYPECTOMY;  Surgeon: Danie Binder, MD;  [facility];  Service: Endoscopy;;  transverse colon polyps times 2, rectal polyp   skin grafts     due to planter warts   TOTAL KNEE  ARTHROPLASTY Right 05/30/2015   Procedure: RIGHT TOTAL KNEE ARTHROPLASTY;  Surgeon: Ninetta Lights, MD;  Location: [place];  Service: Orthopedics;  Laterality: Right;      Allergies: Xeloda [capecitabine], Adhesive [tape], Neosporin original [bacitracin-neomycin-polymyxin], Sulfa antibiotics, and Erythromycin  Medications: Prior to Admission medications   Medication Sig Start Date End Date Taking? Authorizing Provider  acetaminophen (TYLENOL) 500 MG tablet Take 500 mg by mouth every 6 (six) hours as needed (for pain.).    [provider]  APPLE CARCINOMA VINEGAR PO Take 450 mg by mouth daily.    [provider]  Calcium Carbonate-Vitamin D3 600-400 MG-UNIT TABS Take 1 tablet by mouth daily.    [provider]  carvedilol (COREG) 6.25 MG tablet Take 6.25 mg by mouth 2 (two) times daily with a meal.    [provider]  folic acid (FOLVITE) 1 MG tablet Take 1 mg by mouth daily.    [provider]  furosemide (LASIX) 40 MG tablet Take 20 mg by mouth daily as needed for edema. 07/24/19   [provider]  loperamide (IMODIUM) 2 MG capsule Take 2 mg by mouth daily.    [provider]  magnesium gluconate (MAGONATE) 500 MG tablet Take 500 mg by mouth daily as needed (cramps).    [provider]  methotrexate (RHEUMATREX) 2.5 MG tablet Take 20 mg by mouth every Sunday. Caution:Chemotherapy. Protect from light.    [provider]  Multiple Vitamins-Minerals (CENTRUM SILVER PO) Take 1 tablet by mouth daily.    [provider]  NOVOLOG MIX 70/30 FLEXPEN (70-30) 100 UNIT/ML FlexPen Inject 20-30 Units into the skin See admin instructions. Take 30 units in the am and Take 20 units in the evening 09/14/16   [provider]  omeprazole (PRILOSEC) 40 MG capsule Take 40 mg by mouth daily.  11/17/16   [provider]  rivaroxaban (XARELTO) 20 MG TABS tablet Take 1 tablet (20 mg total) by mouth daily with  supper. 11/21/20   Derek Jack, MD  rOPINIRole (REQUIP) 1 MG tablet Take 1 mg by mouth every morning. MAY TAKE 2 ADDITIONAL DOSES  AS NEEDED    [provider]  Saline (AYR SALINE NASAL DROPS) 0.65 % SOLN Place 1 spray into the nose daily as needed (congestion).    [provider]  vitamin E 180 MG (400 UNITS) capsule Take 400 Units by mouth daily.    [provider]     Vital Signs: BP (!) 159/52   Pulse 68   Temp 98.2 F (36.8 C) (Oral)   Resp 15   SpO2 99%   Physical Exam Constitutional:      Appearance: Normal appearance. She is not ill-appearing.  Cardiovascular:     Rate and Rhythm: Normal rate and regular rhythm.     Pulses: Normal pulses.     Heart sounds: Normal heart sounds.  Pulmonary:  Effort: Pulmonary effort is normal. No respiratory distress.     Breath sounds: Normal breath sounds.  Abdominal:     General: Abdomen is flat. There is no distension.     Palpations: Abdomen is soft. There is no mass.     Tenderness: There is no abdominal tenderness.  Skin:    General: Skin is warm and dry.     Coloration: Skin is not jaundiced.  Neurological:     General: No focal deficit present.     Mental Status: She is alert and oriented to person, place, and time.  Psychiatric:        Mood and Affect: Mood normal.        Thought Content: Thought content normal.        Judgment: Judgment normal.    Imaging: No results found.  Labs:  CBC: Recent Labs    12/04/20 0102 12/04/20 0847 01/02/21 0820 01/18/21 0900  WBC 6.8 6.6 8.9 8.6  HGB 8.4* 8.4* 10.7* 11.5*  HCT 25.9* 26.1* 34.1* 37.1  PLT 162 173 251 263    COAGS: Recent Labs    11/29/20 1129 12/01/20 0929 01/02/21 0820 01/18/21 0900  INR 1.0 1.1 1.2 1.1    BMP: Recent Labs    04/02/20 0909 09/19/20 1032 12/03/20 0132 12/04/20 0102 01/02/21 0820 01/18/21 0900  NA 139   < > 138 135 136 141  K 4.8   < > 4.3 4.1 4.0 3.7  CL 104   < > 107 104 102 105  CO2  26   < > $R'23 25 25 26  'AT$ GLUCOSE 198*   < > 225* 162* 245* 187*  BUN 20   < > $R'17 14 16 23  'nb$ CALCIUM 9.5   < > 8.5* 8.3* 8.8* 9.1  CREATININE 1.14*   < > 1.30* 0.99 0.90 0.97  GFRNONAA 47*   < > 43* 59* >60 >60  GFRAA 54*  --   --   --   --   --    < > = values in this interval not displayed.    LIVER FUNCTION TESTS: Recent Labs    12/03/20 0132 12/04/20 0102 01/02/21 0820 01/18/21 0900  BILITOT 0.7 0.8 0.8 1.2  AST 27 40 19 21  ALT 39 45* 20 18  ALKPHOS 33* 37* 37* 41  PROT 5.1* 5.1* 6.4* 7.1  ALBUMIN 2.5* 2.4* 3.1* 3.4*    Assessment and Plan: Multifocal well to moderately differentiated [place] Has undergone prior bland embolization as well as left sided Y90 radioembolization. She is now here for Y90 radioembolization of the right hepatic lesions with pallitaive intent. Risks and benefits of procedure were discussed with the patient including, but not limited to bleeding, infection, vascular injury or contrast induced renal failure.  This interventional procedure involves the use of X-rays and because of the nature of the planned procedure, it is possible that we will have prolonged use of X-ray fluoroscopy.  Potential radiation risks to you include (but are not limited to) the following: - A slightly elevated risk for cancer  several years later in life. This risk is typically less than 0.5% percent. This risk is low in comparison to the normal incidence of human cancer, which is 33% for women and 50% for men according to the [place]. - Radiation induced injury can include skin redness, resembling a rash, tissue breakdown / ulcers and hair loss (which can be temporary or permanent).   The likelihood of either of these  occurring depends on the difficulty of the procedure and whether you are sensitive to radiation due to previous procedures, disease, or genetic conditions.   IF your procedure requires a prolonged use of radiation, you will be notified and given  written instructions for further action.  It is your responsibility to monitor the irradiated area for the 2 weeks following the procedure and to notify your physician if you are concerned that you have suffered a radiation induced injury.    All of the patient's questions were answered, patient is agreeable to proceed.  Consent signed and in chart.   Electronically Signed: Ascencion Dike, PA-C 02/15/2021, 8:52 AM   I spent a total of 25 minutes at the the patient's bedside AND on the patient's hospital floor or unit, greater than 50% of which was counseling/coordinating care for mesenteric/visceral/hepatic arteriogram with right hepatic Y 90 radioembolization

## 2021-02-15 NOTE — Sedation Documentation (Signed)
To nuc med for study 

## 2021-02-16 LAB — AFP TUMOR MARKER: AFP, Serum, Tumor Marker: 89.5 ng/mL — ABNORMAL HIGH (ref 0.0–9.2)

## 2021-02-16 LAB — CEA: CEA: 2.9 ng/mL (ref 0.0–4.7)

## 2021-02-25 ENCOUNTER — Other Ambulatory Visit: Payer: Self-pay | Admitting: Interventional Radiology

## 2021-02-25 ENCOUNTER — Other Ambulatory Visit: Payer: Self-pay

## 2021-02-25 DIAGNOSIS — C220 Liver cell carcinoma: Secondary | ICD-10-CM

## 2021-03-19 LAB — COMPLETE METABOLIC PANEL WITHOUT GFR
AG Ratio: 1.7 (calc) (ref 1.0–2.5)
ALT: 21 U/L (ref 6–29)
AST: 23 U/L (ref 10–35)
Albumin: 3.8 g/dL (ref 3.6–5.1)
Alkaline phosphatase (APISO): 60 U/L (ref 37–153)
BUN/Creatinine Ratio: 20 (calc) (ref 6–22)
BUN: 23 mg/dL (ref 7–25)
CO2: 27 mmol/L (ref 20–32)
Calcium: 9.3 mg/dL (ref 8.6–10.4)
Chloride: 105 mmol/L (ref 98–110)
Creat: 1.14 mg/dL — ABNORMAL HIGH (ref 0.60–1.00)
Globulin: 2.3 g/dL (ref 1.9–3.7)
Glucose, Bld: 190 mg/dL — ABNORMAL HIGH (ref 65–99)
Potassium: 5.2 mmol/L (ref 3.5–5.3)
Sodium: 140 mmol/L (ref 135–146)
Total Bilirubin: 0.6 mg/dL (ref 0.2–1.2)
Total Protein: 6.1 g/dL (ref 6.1–8.1)
eGFR: 50 mL/min/1.73m2 — ABNORMAL LOW (ref >=60)

## 2021-04-04 ENCOUNTER — Other Ambulatory Visit: Payer: Self-pay

## 2021-04-04 ENCOUNTER — Ambulatory Visit
Admission: RE | Admit: 2021-04-04 | Discharge: 2021-04-04 | Disposition: A | Discharge status: Home / Self Care | Payer: Medicare Other | Source: Ambulatory Visit | Attending: Interventional Radiology | Admitting: Interventional Radiology

## 2021-04-04 DIAGNOSIS — C220 Liver cell carcinoma: Secondary | ICD-10-CM

## 2021-04-04 HISTORY — PX: IR RADIOLOGIST EVAL & MGMT: IMG5224

## 2021-04-04 NOTE — Progress Notes (Signed)
Patient ID: Wanda Curry, female   DOB: [birth year], 75 y.o.   MRN: [id number]        Chief Complaint: [facility]; post bland embolization on 07/24/2020 as well as bilobar Y-90 Radioembolization (completion treatment of the right lobe of the liver on 02/15/2021)   Referring Physician(s): Delton Coombes   History of Present Illness: Wanda Curry is a 75 y.o. female with past medical history significant for colon cancer, rheumatoid arthritis, diabetes and hyperlipidemia who was found to have an indeterminate slowly growing lesion within the central aspect of the liver for which he ultimately underwent ultrasound-guided liver biopsy on 05/11/2019 with pathologic diagnosis of well to moderately differentiated hepatocellular carcinoma, ultimately undergoing a bland hepatic embolization on 07/24/2020.  Unfortunately, subsequent surveillance imaging demonstrated developed bilobar disease, confirmed by ultrasound-guided biopsy hypervascular lesion within the lateral segment of the left lobe of the liver performed 11/29/2020 (note, procedure complicated by development intraparenchymal and a subcapsular hematoma requiring a prolonged admission but without dedicated intervention) for which patient ultimately underwent bilobar radioembolization (mapping procedure performed on 01/02/2021; Y 90 radioembolization of the left hepatic artery on 01/18/2021 and Y 90 radioembolization of the right hepatic artery on 02/15/2021).  The patient is seen today in telemedicine consultation for postprocedural evaluation and management.  She is accompanied on the phone call by her daughter.  Patient states that she recovered quicker following the 2nd Y 90 embolization of the right hepatic artery is currently without complaint.  Specifically, no change in appetite or energy level.  No yellowing of the skin or eyes.    Past Medical History:  Diagnosis Date   Anemia    Arthritis    RA   Cancer ([place])    skin cancer   Depression    Diabetes  mellitus without complication (HCC)    Dyspnea    Fibromyalgia    GERD (gastroesophageal reflux disease)    Heart murmur    ECHO scheduled 05-25-2015   Hyperlipidemia    Neuropathy    PONV (postoperative nausea and vomiting)    Puncture wound of great toe     Past Surgical History:  Procedure Laterality Date   ABDOMINAL HYSTERECTOMY     BIOPSY  09/23/2016   Procedure: BIOPSY;  Surgeon: Danie Binder, MD;  [facility];  Service: Endoscopy;;  hepatic flexure mass   CHOLECYSTECTOMY     COLONOSCOPY N/A 08/13/2017   Procedure: COLONOSCOPY;  Surgeon: Daneil Dolin, MD;  [facility];  Service: Endoscopy;  Laterality: N/A;   COLONOSCOPY WITH PROPOFOL N/A 09/23/2016   Procedure: COLONOSCOPY WITH PROPOFOL;  Surgeon: Danie Binder, MD;  [facility];  Service: Endoscopy;  Laterality: N/A;  7:30 am   HARDWARE REMOVAL Right 05/30/2015   Procedure: HARDWARE REMOVAL;  Surgeon: Ninetta Lights, MD;  Location: [place];  Service: Orthopedics;  Laterality: Right;   IR ANGIOGRAM SELECTIVE EACH ADDITIONAL VESSEL  07/25/2019   IR ANGIOGRAM SELECTIVE EACH ADDITIONAL VESSEL  07/25/2019   IR ANGIOGRAM SELECTIVE EACH ADDITIONAL VESSEL  01/02/2021   IR ANGIOGRAM SELECTIVE EACH ADDITIONAL VESSEL  01/02/2021   IR ANGIOGRAM SELECTIVE EACH ADDITIONAL VESSEL  01/02/2021   IR ANGIOGRAM SELECTIVE EACH ADDITIONAL VESSEL  01/18/2021   IR ANGIOGRAM SELECTIVE EACH ADDITIONAL VESSEL  02/15/2021   IR ANGIOGRAM SELECTIVE EACH ADDITIONAL VESSEL  02/15/2021   IR ANGIOGRAM VISCERAL SELECTIVE  07/25/2019   IR ANGIOGRAM VISCERAL SELECTIVE  07/25/2019   IR ANGIOGRAM VISCERAL SELECTIVE  01/02/2021   IR ANGIOGRAM VISCERAL  SELECTIVE  01/02/2021   IR ANGIOGRAM VISCERAL SELECTIVE  01/18/2021   IR ANGIOGRAM VISCERAL SELECTIVE  02/15/2021   IR EMBO ARTERIAL NOT HEMORR HEMANG INC GUIDE ROADMAPPING  01/02/2021   IR EMBO TUMOR ORGAN ISCHEMIA INFARCT INC GUIDE ROADMAPPING  07/25/2019   IR EMBO TUMOR ORGAN ISCHEMIA INFARCT  INC GUIDE ROADMAPPING  01/18/2021   IR EMBO TUMOR ORGAN ISCHEMIA INFARCT INC GUIDE ROADMAPPING  02/15/2021   IR RADIOLOGIST EVAL & MGMT  06/09/2019   IR RADIOLOGIST EVAL & MGMT  07/07/2019   IR RADIOLOGIST EVAL & MGMT  01/05/2020   IR RADIOLOGIST EVAL & MGMT  04/12/2020   IR RADIOLOGIST EVAL & MGMT  09/27/2020   IR RADIOLOGIST EVAL & MGMT  10/31/2020   IR RADIOLOGIST EVAL & MGMT  12/12/2020   IR US GUIDE VASC ACCESS RIGHT  07/25/2019   IR US GUIDE VASC ACCESS RIGHT  01/02/2021   IR US GUIDE VASC ACCESS RIGHT  01/18/2021   IR US GUIDE VASC ACCESS RIGHT  02/15/2021   PARTIAL COLECTOMY N/A 10/10/2016   Procedure: PARTIAL COLECTOMY;  Surgeon: Aviva Signs, MD;  [facility];  Service: General;  Laterality: N/A;   planter warts Bilateral    both feet   POLYPECTOMY  09/23/2016   Procedure: POLYPECTOMY;  Surgeon: Danie Binder, MD;  [facility];  Service: Endoscopy;;  transverse colon polyps times 2, rectal polyp   skin grafts     due to planter warts   TOTAL KNEE ARTHROPLASTY Right 05/30/2015   Procedure: RIGHT TOTAL KNEE ARTHROPLASTY;  Surgeon: Ninetta Lights, MD;  Location: [place];  Service: Orthopedics;  Laterality: Right;    Allergies: Xeloda [capecitabine], Adhesive [tape], Neosporin original [bacitracin-neomycin-polymyxin], Sulfa antibiotics, and Erythromycin  Medications: Prior to Admission medications   Medication Sig Start Date End Date Taking? Authorizing Provider  acetaminophen (TYLENOL) 500 MG tablet Take 500 mg by mouth every 6 (six) hours as needed (for pain.).    [provider]  APPLE CIDER VINEGAR PO Take 450 mg by mouth daily.    [provider]  Calcium Carbonate-Vitamin D3 600-400 MG-UNIT TABS Take 1 tablet by mouth daily.    [provider]  carvedilol (COREG) 6.25 MG tablet Take 6.25 mg by mouth 2 (two) times daily with a meal.    [provider]  cephALEXin (KEFLEX) 500 MG capsule Take 1 capsule (500 mg total) by mouth 2 (two)  times daily. 01/18/21   Allred, Darrell K, PA-C  folic acid (FOLVITE) 1 MG tablet Take 1 mg by mouth daily.    [provider]  furosemide (LASIX) 40 MG tablet Take 20 mg by mouth daily as needed for edema. 07/24/19   [provider]  loperamide (IMODIUM) 2 MG capsule Take 2 mg by mouth daily.    [provider]  magnesium gluconate (MAGONATE) 500 MG tablet Take 500 mg by mouth daily as needed (cramps).    [provider]  methotrexate (RHEUMATREX) 2.5 MG tablet Take 20 mg by mouth every Sunday. Caution:Chemotherapy. Protect from light.    [provider]  Multiple Vitamins-Minerals (CENTRUM SILVER PO) Take 1 tablet by mouth daily.    [provider]  NOVOLOG MIX 70/30 FLEXPEN (70-30) 100 UNIT/ML FlexPen Inject 20-30 Units into the skin See admin instructions. Take 30 units in the am and Take 20 units in the evening 09/14/16   [provider]  omeprazole (PRILOSEC) 40 MG capsule Take 40 mg by mouth daily.  11/17/16  [provider]  rivaroxaban (XARELTO) 20 MG TABS tablet Take 1 tablet (20 mg total) by mouth daily with supper. 11/21/20   Derek Jack, MD  rOPINIRole (REQUIP) 1 MG tablet Take 1 mg by mouth every morning. MAY TAKE 2 ADDITIONAL DOSES  AS NEEDED    [provider]  Saline (AYR SALINE NASAL DROPS) 0.65 % SOLN Place 1 spray into the nose daily as needed (congestion).    [provider]  vitamin E 180 MG (400 UNITS) capsule Take 400 Units by mouth daily.    [provider]     Family History  Problem Relation Age of Onset   Colon cancer Neg Hx     Social History   Socioeconomic History   Marital status: Widowed    Spouse name: Not on file   Number of children: Not on file   Years of education: Not on file   Highest education level: Not on file  Occupational History   Not on file  Tobacco Use   Smoking status: Former    Packs/day: 1.00    Years: 40.00    Pack years: 40.00     Types: Cigarettes    Quit date: 08/18/2004    Years since quitting: 16.6   Smokeless tobacco: Never  Vaping Use   Vaping Use: Never used  Substance and Sexual Activity   Alcohol use: No   Drug use: No   Sexual activity: Not Currently    Birth control/protection: Surgical  Other Topics Concern   Not on file  Social History Narrative   Not on file   Social Determinants of Health   Financial Resource Strain: Not on file  Food Insecurity: Not on file  Transportation Needs: Not on file  Physical Activity: Not on file  Stress: Not on file  Social Connections: Not on file    ECOG Status: 1 - Symptomatic but completely ambulatory  Review of Systems  Review of Systems: A 12 point ROS discussed and pertinent positives are indicated in the HPI above.  All other systems are negative.  Physical Exam No direct physical exam was performed (except for noted visual exam findings with Video Visits).   Vital Signs: There were no vitals taken for this visit.  Imaging: No results found.  Labs:  CBC: Recent Labs    12/04/20 0847 01/02/21 0820 01/18/21 0900 02/15/21 0823  WBC 6.6 8.9 8.6 6.5  HGB 8.4* 10.7* 11.5* 12.5  HCT 26.1* 34.1* 37.1 39.8  PLT 173 251 263 188    COAGS: Recent Labs    12/01/20 0929 01/02/21 0820 01/18/21 0900 02/15/21 0823  INR 1.1 1.2 1.1 1.2    BMP: Recent Labs    12/04/20 0102 01/02/21 0820 01/18/21 0900 02/15/21 0823 03/19/21 0944  NA 135 136 141 138 140  K 4.1 4.0 3.7 4.5 5.2  CL 104 102 105 103 105  CO2 '25 25 26 25 27  '$ GLUCOSE 162* 245* 187* 172* 190*  BUN '14 16 23 19 23  '$ CALCIUM 8.3* 8.8* 9.1 9.1 9.3  CREATININE 0.99 0.90 0.97 1.01* 1.14*  GFRNONAA 59* >60 >60 58*  --     LIVER FUNCTION TESTS: Recent Labs    12/04/20 0102 01/02/21 0820 01/18/21 0900 02/15/21 0823 03/19/21 0944  BILITOT 0.8 0.8 1.2 0.6 0.6  AST 40 '19 21 23 23  '$ ALT 45* '20 18 17 21  '$ ALKPHOS 37* 37* 41 41  --   PROT 5.1* 6.4* 7.1 6.6 6.1  ALBUMIN 2.4*  3.1* 3.4* 3.4*  --     TUMOR MARKERS:     Assessment and Plan:  Tiauna Romo is a 75 y.o. female with past medical history significant for colon cancer, rheumatoid arthritis, diabetes and hyperlipidemia with multifocal hepatocellular carcinoma, most recently post bilobar Y 90 radioembolization with completion radioembolization of the right lobe of the liver performed on 02/15/2021.    The patient has recovered from her completion Y 90 radioembolization and is currently without complaint.  I explained that the initial postprocedural imaging will be performed 3 months following her completion embolization (abdominal MRI to be performed in October 2022).  I relayed to the patient that AFP level obtained prior to her initial radioembolization of the left lobe of the liver was 891 and was found to be significantly reduced at 89 prior to her completion embolization of the right hepatic artery, suggestive of a response to therapy.  The patient and the patient's daughter were encouraged by this news and remain hopeful the treatment will result in a durable and clinically significant results.  The patient and the patient's daughter know to call the interventional radiology clinic with any interval questions or concerns.  Plan: - Follow-up telemedicine consultation following acquisition of postprocedural abdominal MRI and AFP level in October 2022.  A copy of this report was sent to the requesting provider on this date.  Electronically Signed: Sandi Mariscal 04/04/2021, 1:13 PM   I spent a total of 10 Minutes in remote  clinical consultation, greater than 50% of which was counseling/coordinating care for post Y-90 radioembolization for multifocal HCC.    Visit type: Audio only (telephone). Audio (no video) only due to patient's lack of internet/smartphone capability. Alternative for in-person consultation at [facility], [address] [place], [place], [place]. This visit type was conducted  due to national recommendations for restrictions regarding the COVID-19 Pandemic (e.g. social distancing).  This format is felt to be most appropriate for this patient at this time.  All issues noted in this document were discussed and addressed.

## 2021-04-10 NOTE — Progress Notes (Signed)
[place], [place] [address]   CLINIC:  Medical Oncology/Hematology  PCP:  Earney Mallet, MD [address] / [place] [address] [phone]   REASON FOR VISIT:  Follow-up for right colon cancer, hepatocellular carcinoma and iron deficiency state  PRIOR THERAPY:  1. Right hemicolectomy on 10/10/2016. 2. Intermittent Feraheme last on 09/22/2018. 3. Bland embolization of hepatic lesion on 07/25/2019.  NGS Results: not done  CURRENT THERAPY: surveillance  BRIEF ONCOLOGIC HISTORY:  Oncology History  Colon cancer ([place])  10/10/2016 Initial Diagnosis   Colon cancer ([place])   10/10/2016 Surgery   Partial colectomy by Dr. Arnoldo Morale  Pathology shows  a 2.1 cm grade 2 adenocarcinoma of the ascending colon with invasion through the muscularis propria into the peri-colorectal tissues.      11/17/2016 PET scan   The hepatic lesion seen on the CT scan and MRI does not demonstrate hypermetabolism and is likely a benign entity.   Right maxillary sinus disease with associated hypermetabolism.   Scattered pulmonary nodules, likely benign. No follow-up needed if patient is low-risk (and has no known or suspected primary neoplasm). Non-contrast chest CT can be considered in 12 months if patient is high-risk.    12/25/2016 - 02/04/2017 Adjuvant Chemotherapy   Xeloda '2000mg'$  PO BID take for 14 days out of 21 days. Plan for total of 24 weeks of treatment.     CANCER STAGING: Cancer Staging Colon cancer [facility]) Staging form: Colon and Rectum, AJCC 8th Edition - Clinical: Stage IIA (cT3, cN0, cM0) - Signed by [name], MD on 11/26/2016   INTERVAL HISTORY:  [name], a 75 y.o. female, returns for routine follow-up of her right colon cancer, hepatocellular carcinoma and iron deficiency state. Shiasia was last seen on 12/11/20.   Today she reports feeling well. She reports abdominal cramping and easy bruising, but denies any bleeding issues.  She has a history of PE and DVT.   REVIEW OF SYSTEMS:  Review of Systems  Constitutional:  Positive for appetite change (60%) and fatigue (40%).  HENT:   Negative for nosebleeds.   Gastrointestinal:  Positive for abdominal pain (cramping) and diarrhea. Negative for blood in stool.  Genitourinary:  Negative for hematuria.   Neurological:  Positive for dizziness and numbness (neuropathy in feet).  Hematological:  Bruises/bleeds easily.  Psychiatric/Behavioral:  Positive for sleep disturbance.   All other systems reviewed and are negative.  PAST MEDICAL/SURGICAL HISTORY:  Past Medical History:  Diagnosis Date   Anemia    Arthritis    RA   Cancer ([place])    skin cancer   Depression    Diabetes mellitus without complication (HCC)    Dyspnea    Fibromyalgia    GERD (gastroesophageal reflux disease)    Heart murmur    ECHO scheduled 05-25-2015   Hyperlipidemia    Neuropathy    PONV (postoperative nausea and vomiting)    Puncture wound of great toe    Past Surgical History:  Procedure Laterality Date   ABDOMINAL HYSTERECTOMY     BIOPSY  09/23/2016   Procedure: BIOPSY;  Surgeon: Danie Binder, MD;  [facility];  Service: Endoscopy;;  hepatic flexure mass   CHOLECYSTECTOMY     COLONOSCOPY N/A 08/13/2017   Procedure: COLONOSCOPY;  Surgeon: Daneil Dolin, MD;  [facility];  Service: Endoscopy;  Laterality: N/A;   COLONOSCOPY WITH PROPOFOL N/A 09/23/2016   Procedure: COLONOSCOPY WITH PROPOFOL;  Surgeon: Danie Binder,  MD;  [facility];  Service: Endoscopy;  Laterality: N/A;  7:30 am   HARDWARE REMOVAL Right 05/30/2015   Procedure: HARDWARE REMOVAL;  Surgeon: Ninetta Lights, MD;  Location: [place];  Service: Orthopedics;  Laterality: Right;   IR ANGIOGRAM SELECTIVE EACH ADDITIONAL VESSEL  07/25/2019   IR ANGIOGRAM SELECTIVE EACH ADDITIONAL VESSEL  07/25/2019   IR ANGIOGRAM SELECTIVE EACH ADDITIONAL VESSEL  01/02/2021   IR ANGIOGRAM SELECTIVE EACH  ADDITIONAL VESSEL  01/02/2021   IR ANGIOGRAM SELECTIVE EACH ADDITIONAL VESSEL  01/02/2021   IR ANGIOGRAM SELECTIVE EACH ADDITIONAL VESSEL  01/18/2021   IR ANGIOGRAM SELECTIVE EACH ADDITIONAL VESSEL  02/15/2021   IR ANGIOGRAM SELECTIVE EACH ADDITIONAL VESSEL  02/15/2021   IR ANGIOGRAM VISCERAL SELECTIVE  07/25/2019   IR ANGIOGRAM VISCERAL SELECTIVE  07/25/2019   IR ANGIOGRAM VISCERAL SELECTIVE  01/02/2021   IR ANGIOGRAM VISCERAL SELECTIVE  01/02/2021   IR ANGIOGRAM VISCERAL SELECTIVE  01/18/2021   IR ANGIOGRAM VISCERAL SELECTIVE  02/15/2021   IR EMBO ARTERIAL NOT HEMORR HEMANG INC GUIDE ROADMAPPING  01/02/2021   IR EMBO TUMOR ORGAN ISCHEMIA INFARCT INC GUIDE ROADMAPPING  07/25/2019   IR EMBO TUMOR ORGAN ISCHEMIA INFARCT INC GUIDE ROADMAPPING  01/18/2021   IR EMBO TUMOR ORGAN ISCHEMIA INFARCT INC GUIDE ROADMAPPING  02/15/2021   IR RADIOLOGIST EVAL & MGMT  06/09/2019   IR RADIOLOGIST EVAL & MGMT  07/07/2019   IR RADIOLOGIST EVAL & MGMT  01/05/2020   IR RADIOLOGIST EVAL & MGMT  04/12/2020   IR RADIOLOGIST EVAL & MGMT  09/27/2020   IR RADIOLOGIST EVAL & MGMT  10/31/2020   IR RADIOLOGIST EVAL & MGMT  12/12/2020   IR RADIOLOGIST EVAL & MGMT  04/04/2021   IR US GUIDE VASC ACCESS RIGHT  07/25/2019   IR US GUIDE VASC ACCESS RIGHT  01/02/2021   IR US GUIDE VASC ACCESS RIGHT  01/18/2021   IR US GUIDE VASC ACCESS RIGHT  02/15/2021   PARTIAL COLECTOMY N/A 10/10/2016   Procedure: PARTIAL COLECTOMY;  Surgeon: Aviva Signs, MD;  [facility];  Service: General;  Laterality: N/A;   planter warts Bilateral    both feet   POLYPECTOMY  09/23/2016   Procedure: POLYPECTOMY;  Surgeon: Danie Binder, MD;  [facility];  Service: Endoscopy;;  transverse colon polyps times 2, rectal polyp   skin grafts     due to planter warts   TOTAL KNEE ARTHROPLASTY Right 05/30/2015   Procedure: RIGHT TOTAL KNEE ARTHROPLASTY;  Surgeon: Ninetta Lights, MD;  Location: [place];  Service: Orthopedics;  Laterality: Right;    SOCIAL HISTORY:   Social History   Socioeconomic History   Marital status: Widowed    Spouse name: Not on file   Number of children: Not on file   Years of education: Not on file   Highest education level: Not on file  Occupational History   Not on file  Tobacco Use   Smoking status: Former    Packs/day: 1.00    Years: 40.00    Pack years: 40.00    Types: Cigarettes    Quit date: 08/18/2004    Years since quitting: 16.6   Smokeless tobacco: Never  Vaping Use   Vaping Use: Never used  Substance and Sexual Activity   Alcohol use: No   Drug use: No   Sexual activity: Not Currently    Birth control/protection: Surgical  Other Topics Concern   Not on file  Social History Narrative  Not on file   Social Determinants of Health   Financial Resource Strain: Not on file  Food Insecurity: Not on file  Transportation Needs: Not on file  Physical Activity: Not on file  Stress: Not on file  Social Connections: Not on file  Intimate Partner Violence: Not on file    FAMILY HISTORY:  Family History  Problem Relation Age of Onset   Colon cancer Neg Hx     CURRENT MEDICATIONS:  Current Outpatient Medications  Medication Sig Dispense Refill   acetaminophen (TYLENOL) 500 MG tablet Take 500 mg by mouth every 6 (six) hours as needed (for pain.).     APPLE CIDER VINEGAR PO Take 450 mg by mouth daily.     Calcium Carbonate-Vitamin D3 600-400 MG-UNIT TABS Take 1 tablet by mouth daily.     carvedilol (COREG) 6.25 MG tablet Take 6.25 mg by mouth 2 (two) times daily with a meal.     cephALEXin (KEFLEX) 500 MG capsule Take 1 capsule (500 mg total) by mouth 2 (two) times daily. 14 capsule 0   folic acid (FOLVITE) 1 MG tablet Take 1 mg by mouth daily.     furosemide (LASIX) 40 MG tablet Take 20 mg by mouth daily as needed for edema.     loperamide (IMODIUM) 2 MG capsule Take 2 mg by mouth daily.     magnesium gluconate (MAGONATE) 500 MG tablet Take 500 mg by mouth daily as needed (cramps).      methotrexate (RHEUMATREX) 2.5 MG tablet Take 20 mg by mouth every Sunday. Caution:Chemotherapy. Protect from light.     Multiple Vitamins-Minerals (CENTRUM SILVER PO) Take 1 tablet by mouth daily.     NOVOLOG MIX 70/30 FLEXPEN (70-30) 100 UNIT/ML FlexPen Inject 20-30 Units into the skin See admin instructions. Take 30 units in the am and Take 20 units in the evening  3   omeprazole (PRILOSEC) 40 MG capsule Take 40 mg by mouth daily.   3   rivaroxaban (XARELTO) 20 MG TABS tablet Take 1 tablet (20 mg total) by mouth daily with supper. 30 tablet 6   rOPINIRole (REQUIP) 1 MG tablet Take 1 mg by mouth every morning. MAY TAKE 2 ADDITIONAL DOSES  AS NEEDED     Saline (AYR SALINE NASAL DROPS) 0.65 % SOLN Place 1 spray into the nose daily as needed (congestion).     vitamin E 180 MG (400 UNITS) capsule Take 400 Units by mouth daily.     No current facility-administered medications for this visit.   Facility-Administered Medications Ordered in Other Visits  Medication Dose Route Frequency Provider Last Rate Last Admin   0.9 %  sodium chloride infusion   Intravenous Continuous Holley Bouche, NP   Stopped at 09/14/17 1440    ALLERGIES:  Allergies  Allergen Reactions   Xeloda [Capecitabine] Other (See Comments)    Blisters and pain to skin to include feet and arms   Adhesive [Tape]     Adhesive tape and band-aids cause skin irritation   Neosporin Original [Bacitracin-Neomycin-Polymyxin] Other (See Comments)    blister   Sulfa Antibiotics Nausea Only and Other (See Comments)    Joint paint   Erythromycin Itching and Rash    burning    PHYSICAL EXAM:  Performance status (ECOG): 2 - Symptomatic, <50% confined to bed  There were no vitals filed for this visit. Wt Readings from Last 3 Encounters:  01/18/21 234 lb (106.1 kg)  12/11/20 234 lb 1.6 oz (106.2 kg)  12/02/20  238 lb 1.6 oz (108 kg)   Physical Exam Vitals reviewed.  Constitutional:      Appearance: Normal appearance. She is  obese.     Comments: In wheelchair  Cardiovascular:     Rate and Rhythm: Normal rate and regular rhythm.     Pulses: Normal pulses.     Heart sounds: Normal heart sounds.  Pulmonary:     Effort: Pulmonary effort is normal.     Breath sounds: Normal breath sounds.  Abdominal:     Palpations: Abdomen is soft. There is no hepatomegaly, splenomegaly or mass.     Tenderness: There is no abdominal tenderness.  Neurological:     General: No focal deficit present.     Mental Status: She is alert and oriented to person, place, and time.  Psychiatric:        Mood and Affect: Mood normal.        Behavior: Behavior normal.     LABORATORY DATA:  I have reviewed the labs as listed.  CBC Latest Ref Rng & Units 02/15/2021 01/18/2021 01/02/2021  WBC 4.0 - 10.5 K/uL 6.5 8.6 8.9  Hemoglobin 12.0 - 15.0 g/dL 12.5 11.5(L) 10.7(L)  Hematocrit 36.0 - 46.0 % 39.8 37.1 34.1(L)  Platelets 150 - 400 K/uL 188 263 251   CMP Latest Ref Rng & Units 03/19/2021 02/15/2021 01/18/2021  Glucose 65 - 99 mg/dL 190(H) 172(H) 187(H)  BUN 7 - 25 mg/dL '23 19 23  '$ Creatinine 0.60 - 1.00 mg/dL 1.14(H) 1.01(H) 0.97  Sodium 135 - 146 mmol/L 140 138 141  Potassium 3.5 - 5.3 mmol/L 5.2 4.5 3.7  Chloride 98 - 110 mmol/L 105 103 105  CO2 20 - 32 mmol/L '27 25 26  '$ Calcium 8.6 - 10.4 mg/dL 9.3 9.1 9.1  Total Protein 6.1 - 8.1 g/dL 6.1 6.6 7.1  Total Bilirubin 0.2 - 1.2 mg/dL 0.6 0.6 1.2  Alkaline Phos 38 - 126 U/L - 41 41  AST 10 - 35 U/L '23 23 21  '$ ALT 6 - 29 U/L '21 17 18    '$ DIAGNOSTIC IMAGING:  I have independently reviewed the scans and discussed with the patient. IR Radiologist Eval & Mgmt  Result Date: 04/05/2021 Please refer to notes tab for details about interventional procedure. (Op Note)    ASSESSMENT:  1.  Hepatocellular carcinoma: -MRI of the abdomen with Eovist on 04/21/2019 showed hypoenhancing lesion in the liver, which was not typical for [facility].  It was not significantly hypermetabolic on prior PET/CT.  This lesion has  been gradually increasing since 10/08/2016. - AFP was normal at 3.9. -Biopsy on 05/11/2019 shows well to moderately differentiated hepatocellular carcinoma, steatohepatitic variant. -CT chest on 05/30/2019 was negative for metastatic disease. -Bland embolization of the hepatic lesion on 07/25/2019. -MRI of the abdomen with and without contrast on 09/21/2020 showed previous treated lesion in the central aspect of the liver measures 4.4 x 3.2 cm, previously 3.1 x 2.6 cm.  Hypervascular left liver lobe lesion measuring 1.3 x 1.1 cm.  6 mm hypervascular focus noted in the inferior aspect of the right lobe of the liver. -CTAP with and without contrast on 10/25/2020 showed left liver lesion measuring 1.8 x 1.4 cm, for inferior right liver lesion measuring 1 x 0.9 cm which are new.  Recurrent hyperenhancing tumor at the treated tumor site in the central liver, measuring 6 x 5 cm with extension into the caudate lobe.  No evidence of metastatic disease in the chest. - Left lobe liver biopsy on  11/29/2020-well to moderately differentiated [place].   2.  Stage IIa (CT3CN0) colon cancer: -Right hemicolectomy on 10/10/2016, grade 2 adenocarcinoma, positive LVSI, margins negative, 0/19 lymph nodes positive. -Because of positive LVSI, she was recommended Xeloda.  She could not tolerate more than 2 cycles.  She developed severe hand-foot skin reaction. -PET scan on 02/23/2018- for metastatic disease.  Liver mass was also not seen. -CEA on 03/29/2019 was 4.5. -CT AP on 03/29/2019 showed mass of the liver measuring 5.2 cm, previously 4.8 cm.   3.  Diarrhea: -She has watery diarrhea up to once per day.  She takes Imodium 1-2 tablets/day which helps.  She had this diarrhea since surgery.   4.  Unprovoked PE: -Incidental PE on CT chest in August 2018.  She was treated with Xarelto 20 mg for a year. -Dose was reduced to 10 mg daily in October 2019.  She is tolerating it without any problems.   PLAN:  1.  Hepatocellular  carcinoma: -She underwent Y 90 treatments, last 1 on 02/15/2021. - Her AFP improved from 891-89.  We have sent another AFP today. - She has follow-up with Dr. Pascal Lux with imaging MRI of the liver in October. - RTC 6 months for follow-up with a CT scan of the abdomen and pelvis with contrast.  We will also check CEA and AFP levels.   2.  Stage IIa (CT3CN0) colon cancer: - No change in bowel habits.  No bleeding per rectum. - Last CEA was within normal limits. - Last CTAP was in March 2022 which did not show any evidence of recurrence. - We will plan to repeat CT of abdomen and pelvis in 6 months.   3.  Unprovoked PE: - She will continue Xarelto 20 mg daily.  No bleeding issues reported.   4.  Diarrhea: - She has mild diarrhea since colon cancer surgery.  Continue Imodium as needed.   Orders placed this encounter:  No orders of the defined types were placed in this encounter.    Derek Jack, MD [place] [phone]   I, Thana Ates, am acting as a scribe for Dr. Derek Jack.  I, Derek Jack MD, have reviewed the above documentation for accuracy and completeness, and I agree with the above.

## 2021-04-11 ENCOUNTER — Other Ambulatory Visit: Payer: Self-pay

## 2021-04-11 ENCOUNTER — Inpatient Hospital Stay (HOSPITAL_COMMUNITY): Payer: Medicare Other

## 2021-04-11 ENCOUNTER — Inpatient Hospital Stay (HOSPITAL_COMMUNITY): Payer: Medicare Other | Attending: Hematology | Admitting: Hematology

## 2021-04-11 VITALS — BP 141/44 | HR 76 | Temp 98.7°F | Resp 18 | Wt 209.4 lb

## 2021-04-11 DIAGNOSIS — C220 Liver cell carcinoma: Secondary | ICD-10-CM

## 2021-04-11 DIAGNOSIS — Z79899 Other long term (current) drug therapy: Secondary | ICD-10-CM | POA: Diagnosis not present

## 2021-04-11 DIAGNOSIS — C182 Malignant neoplasm of ascending colon: Secondary | ICD-10-CM | POA: Insufficient documentation

## 2021-04-11 DIAGNOSIS — Z7901 Long term (current) use of anticoagulants: Secondary | ICD-10-CM | POA: Diagnosis not present

## 2021-04-11 DIAGNOSIS — R197 Diarrhea, unspecified: Secondary | ICD-10-CM | POA: Diagnosis not present

## 2021-04-11 LAB — CBC WITH DIFFERENTIAL/PLATELET
Abs Immature Granulocytes: 0.03 10*3/uL (ref 0.00–0.07)
Basophils Absolute: 0 10*3/uL (ref 0.0–0.1)
Basophils Relative: 1 %
Eosinophils Absolute: 0.2 10*3/uL (ref 0.0–0.5)
Eosinophils Relative: 3 %
HCT: 42.2 % (ref 36.0–46.0)
Hemoglobin: 13.7 g/dL (ref 12.0–15.0)
Immature Granulocytes: 0 %
Lymphocytes Relative: 11 %
Lymphs Abs: 0.8 10*3/uL (ref 0.7–4.0)
MCH: 30.2 pg (ref 26.0–34.0)
MCHC: 32.5 g/dL (ref 30.0–36.0)
MCV: 93.2 fL (ref 80.0–100.0)
Monocytes Absolute: 0.3 10*3/uL (ref 0.1–1.0)
Monocytes Relative: 4 %
Neutro Abs: 5.8 10*3/uL (ref 1.7–7.7)
Neutrophils Relative %: 81 %
Platelets: 174 10*3/uL (ref 150–400)
RBC: 4.53 MIL/uL (ref 3.87–5.11)
RDW: 14.6 % (ref 11.5–15.5)
WBC: 7.1 10*3/uL (ref 4.0–10.5)
nRBC: 0 % (ref 0.0–0.2)

## 2021-04-11 LAB — COMPREHENSIVE METABOLIC PANEL
ALT: 48 U/L — ABNORMAL HIGH (ref 0–44)
AST: 40 U/L (ref 15–41)
Albumin: 3.2 g/dL — ABNORMAL LOW (ref 3.5–5.0)
Alkaline Phosphatase: 62 U/L (ref 38–126)
Anion gap: 8 (ref 5–15)
BUN: 20 mg/dL (ref 8–23)
CO2: 27 mmol/L (ref 22–32)
Calcium: 9.4 mg/dL (ref 8.9–10.3)
Chloride: 103 mmol/L (ref 98–111)
Creatinine, Ser: 1.11 mg/dL — ABNORMAL HIGH (ref 0.44–1.00)
GFR, Estimated: 52 mL/min — ABNORMAL LOW (ref >60)
Glucose, Bld: 173 mg/dL — ABNORMAL HIGH (ref 70–99)
Potassium: 5.2 mmol/L — ABNORMAL HIGH (ref 3.5–5.1)
Sodium: 138 mmol/L (ref 135–145)
Total Bilirubin: 0.9 mg/dL (ref 0.3–1.2)
Total Protein: 6.6 g/dL (ref 6.5–8.1)

## 2021-04-11 NOTE — Patient Instructions (Addendum)
[place] at [facility] Discharge Instructions  You were seen today by Dr. Delton Coombes. He went over your recent results. You will be scheduled for a CT scan of your abdomen and pelvis prior to your next appointment. Dr. Delton Coombes will see you back in 6 months for labs and follow up.   Thank you for choosing [place] at [facility] to provide your oncology and hematology care.  To afford each patient quality time with our provider, please arrive at least 15 minutes before your scheduled appointment time.   If you have a lab appointment with the [facility] please come in thru the Main Entrance and check in at the main information desk  You need to re-schedule your appointment should you arrive 10 or more minutes late.  We strive to give you quality time with our providers, and arriving late affects you and other patients whose appointments are after yours.  Also, if you no show three or more times for appointments you may be dismissed from the clinic at the providers discretion.     Again, thank you for choosing [facility].  Our hope is that these requests will decrease the amount of time that you wait before being seen by our physicians.       _____________________________________________________________  Should you have questions after your visit to [facility], please contact our office at [phone] between the hours of 8:00 a.m. and 4:30 p.m.  Voicemails left after 4:00 p.m. will not be returned until the following business day.  For prescription refill requests, have your pharmacy contact our office and allow 72 hours.    Cancer Center Support Programs:   > Cancer Support Group  2nd Tuesday of the month 1pm-2pm, Journey Room

## 2021-04-12 LAB — AFP TUMOR MARKER: AFP, Serum, Tumor Marker: 16.7 ng/mL — ABNORMAL HIGH (ref 0.0–9.2)

## 2021-04-12 LAB — CEA: CEA: 3.9 ng/mL (ref 0.0–4.7)

## 2021-05-06 ENCOUNTER — Other Ambulatory Visit: Payer: Self-pay | Admitting: Interventional Radiology

## 2021-05-06 ENCOUNTER — Other Ambulatory Visit: Payer: Self-pay

## 2021-05-06 DIAGNOSIS — C220 Liver cell carcinoma: Secondary | ICD-10-CM

## 2021-05-17 ENCOUNTER — Other Ambulatory Visit (HOSPITAL_COMMUNITY)
Admission: RE | Admit: 2021-05-17 | Discharge: 2021-05-17 | Disposition: A | Discharge status: Home / Self Care | Payer: Medicare Other | Source: Ambulatory Visit | Attending: Interventional Radiology | Admitting: Interventional Radiology

## 2021-05-17 ENCOUNTER — Ambulatory Visit (HOSPITAL_COMMUNITY)
Admission: RE | Admit: 2021-05-17 | Discharge: 2021-05-17 | Disposition: A | Discharge status: Home / Self Care | Payer: Medicare Other | Source: Ambulatory Visit | Attending: Interventional Radiology | Admitting: Interventional Radiology

## 2021-05-17 ENCOUNTER — Other Ambulatory Visit: Payer: Self-pay

## 2021-05-17 DIAGNOSIS — C220 Liver cell carcinoma: Secondary | ICD-10-CM | POA: Insufficient documentation

## 2021-05-17 DIAGNOSIS — C182 Malignant neoplasm of ascending colon: Secondary | ICD-10-CM

## 2021-05-17 LAB — COMPREHENSIVE METABOLIC PANEL
ALT: 30 U/L (ref 0–44)
AST: 37 U/L (ref 15–41)
Albumin: 3.1 g/dL — ABNORMAL LOW (ref 3.5–5.0)
Alkaline Phosphatase: 87 U/L (ref 38–126)
Anion gap: 8 (ref 5–15)
BUN: 12 mg/dL (ref 8–23)
CO2: 26 mmol/L (ref 22–32)
Calcium: 9.2 mg/dL (ref 8.9–10.3)
Chloride: 104 mmol/L (ref 98–111)
Creatinine, Ser: 1 mg/dL (ref 0.44–1.00)
GFR, Estimated: 58 mL/min — ABNORMAL LOW (ref >60)
Glucose, Bld: 222 mg/dL — ABNORMAL HIGH (ref 70–99)
Potassium: 5.3 mmol/L — ABNORMAL HIGH (ref 3.5–5.1)
Sodium: 138 mmol/L (ref 135–145)
Total Bilirubin: 1.3 mg/dL — ABNORMAL HIGH (ref 0.3–1.2)
Total Protein: 6.8 g/dL (ref 6.5–8.1)

## 2021-05-17 LAB — CBC WITH DIFFERENTIAL/PLATELET
Abs Immature Granulocytes: 0.03 10*3/uL (ref 0.00–0.07)
Basophils Absolute: 0 10*3/uL (ref 0.0–0.1)
Basophils Relative: 1 %
Eosinophils Absolute: 0.1 10*3/uL (ref 0.0–0.5)
Eosinophils Relative: 1 %
HCT: 44.8 % (ref 36.0–46.0)
Hemoglobin: 14.6 g/dL (ref 12.0–15.0)
Immature Granulocytes: 0 %
Lymphocytes Relative: 14 %
Lymphs Abs: 1.1 10*3/uL (ref 0.7–4.0)
MCH: 31.1 pg (ref 26.0–34.0)
MCHC: 32.6 g/dL (ref 30.0–36.0)
MCV: 95.5 fL (ref 80.0–100.0)
Monocytes Absolute: 0.3 10*3/uL (ref 0.1–1.0)
Monocytes Relative: 4 %
Neutro Abs: 6.6 10*3/uL (ref 1.7–7.7)
Neutrophils Relative %: 80 %
Platelets: 153 10*3/uL (ref 150–400)
RBC: 4.69 MIL/uL (ref 3.87–5.11)
RDW: 13.7 % (ref 11.5–15.5)
WBC: 8.3 10*3/uL (ref 4.0–10.5)
nRBC: 0 % (ref 0.0–0.2)

## 2021-05-17 MED ORDER — GADOBUTROL 1 MMOL/ML IV SOLN
10.0000 mL | Freq: Once | INTRAVENOUS | Status: AC | PRN
  Administered 2021-05-17: 10 mL via INTRAVENOUS

## 2021-05-19 LAB — AFP TUMOR MARKER: AFP, Serum, Tumor Marker: 12.1 ng/mL — ABNORMAL HIGH (ref 0.0–9.2)

## 2021-05-23 ENCOUNTER — Other Ambulatory Visit: Payer: Self-pay

## 2021-05-23 ENCOUNTER — Ambulatory Visit
Admission: RE | Admit: 2021-05-23 | Discharge: 2021-05-23 | Disposition: A | Discharge status: Home / Self Care | Payer: Medicare Other | Source: Ambulatory Visit | Attending: Interventional Radiology | Admitting: Interventional Radiology

## 2021-05-23 ENCOUNTER — Encounter: Payer: Self-pay | Admitting: *Deleted

## 2021-05-23 DIAGNOSIS — C220 Liver cell carcinoma: Secondary | ICD-10-CM

## 2021-05-23 HISTORY — PX: IR RADIOLOGIST EVAL & MGMT: IMG5224

## 2021-05-23 NOTE — Progress Notes (Signed)
Patient ID: Wanda Curry, female   DOB: 1946-07-22, 74 y.o.   MRN: [id number]        Chief Complaint: [facility]; post bland embolization on 07/24/2020 as well as bilobar Y-90 Radioembolization (completion treatment of the right lobe of the liver on 02/15/2021)   Referring Physician(s): Delton Coombes   History of Present Illness: Wanda Curry is a 75 y.o. female with past medical history significant for colon cancer, rheumatoid arthritis, diabetes and hyperlipidemia who was found to have an indeterminate slowly growing lesion within the central aspect of the liver for which he ultimately underwent ultrasound-guided liver biopsy on 05/11/2019 with pathologic diagnosis of well to moderately differentiated hepatocellular carcinoma, ultimately undergoing a bland hepatic embolization on 07/24/2020. Unfortunately, subsequent surveillance imaging demonstrated developed bilobar disease, confirmed by ultrasound-guided biopsy hypervascular lesion within the lateral segment of the left lobe of the liver performed 11/29/2020 (note, procedure complicated by development intraparenchymal and a subcapsular hematoma requiring a prolonged admission but without dedicated intervention) for which patient ultimately underwent bilobar radioembolization (mapping procedure performed on 01/02/2021; Y 90 radioembolization of the left hepatic artery on 01/18/2021 and Y 90 radioembolization of the right hepatic artery on 02/15/2021).  The patient is seen today in telemedicine consultation for evaluation and management following acquisition of initial postprocedural AFP level and MRI performed 05/17/2021.  She is accompanied on the phone call by her daughter.  Patient reports centralized abdominal and lower chest pain while lying down since undergoing the Y 90 radioembolization.  She states this is not significantly severe and has not significantly changed since undergoing the intervention.  Patient states improved appetite level with no  significant change in her energy level.  She is otherwise without complaint.      Past Medical History:  Diagnosis Date   Anemia    Arthritis    RA   Cancer ([place])    skin cancer   Depression    Diabetes mellitus without complication (HCC)    Dyspnea    Fibromyalgia    GERD (gastroesophageal reflux disease)    Heart murmur    ECHO scheduled 05-25-2015   Hyperlipidemia    Neuropathy    PONV (postoperative nausea and vomiting)    Puncture wound of great toe     Past Surgical History:  Procedure Laterality Date   ABDOMINAL HYSTERECTOMY     BIOPSY  09/23/2016   Procedure: BIOPSY;  Surgeon: Danie Binder, MD;  [facility];  Service: Endoscopy;;  hepatic flexure mass   CHOLECYSTECTOMY     COLONOSCOPY N/A 08/13/2017   Procedure: COLONOSCOPY;  Surgeon: Daneil Dolin, MD;  [facility];  Service: Endoscopy;  Laterality: N/A;   COLONOSCOPY WITH PROPOFOL N/A 09/23/2016   Procedure: COLONOSCOPY WITH PROPOFOL;  Surgeon: Danie Binder, MD;  [facility];  Service: Endoscopy;  Laterality: N/A;  7:30 am   HARDWARE REMOVAL Right 05/30/2015   Procedure: HARDWARE REMOVAL;  Surgeon: Ninetta Lights, MD;  Location: [place];  Service: Orthopedics;  Laterality: Right;   IR ANGIOGRAM SELECTIVE EACH ADDITIONAL VESSEL  07/25/2019   IR ANGIOGRAM SELECTIVE EACH ADDITIONAL VESSEL  07/25/2019   IR ANGIOGRAM SELECTIVE EACH ADDITIONAL VESSEL  01/02/2021   IR ANGIOGRAM SELECTIVE EACH ADDITIONAL VESSEL  01/02/2021   IR ANGIOGRAM SELECTIVE EACH ADDITIONAL VESSEL  01/02/2021   IR ANGIOGRAM SELECTIVE EACH ADDITIONAL VESSEL  01/18/2021   IR ANGIOGRAM SELECTIVE EACH ADDITIONAL VESSEL  02/15/2021   IR ANGIOGRAM SELECTIVE EACH ADDITIONAL VESSEL  02/15/2021  IR ANGIOGRAM VISCERAL SELECTIVE  07/25/2019   IR ANGIOGRAM VISCERAL SELECTIVE  07/25/2019   IR ANGIOGRAM VISCERAL SELECTIVE  01/02/2021   IR ANGIOGRAM VISCERAL SELECTIVE  01/02/2021   IR ANGIOGRAM VISCERAL SELECTIVE  01/18/2021   IR  ANGIOGRAM VISCERAL SELECTIVE  02/15/2021   IR EMBO ARTERIAL NOT HEMORR HEMANG INC GUIDE ROADMAPPING  01/02/2021   IR EMBO TUMOR ORGAN ISCHEMIA INFARCT INC GUIDE ROADMAPPING  07/25/2019   IR EMBO TUMOR ORGAN ISCHEMIA INFARCT INC GUIDE ROADMAPPING  01/18/2021   IR EMBO TUMOR ORGAN ISCHEMIA INFARCT INC GUIDE ROADMAPPING  02/15/2021   IR RADIOLOGIST EVAL & MGMT  06/09/2019   IR RADIOLOGIST EVAL & MGMT  07/07/2019   IR RADIOLOGIST EVAL & MGMT  01/05/2020   IR RADIOLOGIST EVAL & MGMT  04/12/2020   IR RADIOLOGIST EVAL & MGMT  09/27/2020   IR RADIOLOGIST EVAL & MGMT  10/31/2020   IR RADIOLOGIST EVAL & MGMT  12/12/2020   IR RADIOLOGIST EVAL & MGMT  04/04/2021   IR US GUIDE VASC ACCESS RIGHT  07/25/2019   IR US GUIDE VASC ACCESS RIGHT  01/02/2021   IR US GUIDE VASC ACCESS RIGHT  01/18/2021   IR US GUIDE VASC ACCESS RIGHT  02/15/2021   PARTIAL COLECTOMY N/A 10/10/2016   Procedure: PARTIAL COLECTOMY;  Surgeon: Aviva Signs, MD;  [facility];  Service: General;  Laterality: N/A;   planter warts Bilateral    both feet   POLYPECTOMY  09/23/2016   Procedure: POLYPECTOMY;  Surgeon: Danie Binder, MD;  [facility];  Service: Endoscopy;;  transverse colon polyps times 2, rectal polyp   skin grafts     due to planter warts   TOTAL KNEE ARTHROPLASTY Right 05/30/2015   Procedure: RIGHT TOTAL KNEE ARTHROPLASTY;  Surgeon: Ninetta Lights, MD;  Location: [place];  Service: Orthopedics;  Laterality: Right;    Allergies: Xeloda [capecitabine], Adhesive [tape], Neosporin original [bacitracin-neomycin-polymyxin], Sulfa antibiotics, and Erythromycin  Medications: Prior to Admission medications   Medication Sig Start Date End Date Taking? Authorizing Provider  acetaminophen (TYLENOL) 500 MG tablet Take 500 mg by mouth every 6 (six) hours as needed (for pain.). Patient not taking: Reported on 04/11/2021    [provider]  APPLE CIDER VINEGAR PO Take 450 mg by mouth daily. Patient not taking: Reported on  04/11/2021    [provider]  Calcium Carbonate-Vitamin D3 600-400 MG-UNIT TABS Take 1 tablet by mouth daily.    [provider]  carvedilol (COREG) 6.25 MG tablet Take 6.25 mg by mouth 2 (two) times daily with a meal.    [provider]  folic acid (FOLVITE) 1 MG tablet Take 1 mg by mouth daily.    [provider]  furosemide (LASIX) 40 MG tablet Take 20 mg by mouth daily as needed for edema. Patient not taking: Reported on 04/11/2021 07/24/19   [provider]  loperamide (IMODIUM) 2 MG capsule Take 2 mg by mouth daily.    [provider]  magnesium gluconate (MAGONATE) 500 MG tablet Take 500 mg by mouth daily as needed (cramps). Patient not taking: Reported on 04/11/2021    [provider]  methotrexate (RHEUMATREX) 2.5 MG tablet Take 20 mg by mouth every Sunday. Caution:Chemotherapy. Protect from light.    [provider]  Multiple Vitamins-Minerals (CENTRUM SILVER PO) Take 1 tablet by mouth daily.    [provider]  NOVOLOG MIX 70/30 FLEXPEN (70-30) 100 UNIT/ML FlexPen Inject 20-30 Units into the skin See  admin instructions. Take 30 units in the am and Take 20 units in the evening 09/14/16   [provider]  omeprazole (PRILOSEC) 40 MG capsule Take 40 mg by mouth daily.  11/17/16   [provider]  rivaroxaban (XARELTO) 20 MG TABS tablet Take 1 tablet (20 mg total) by mouth daily with supper. 11/21/20   Derek Jack, MD  rOPINIRole (REQUIP) 1 MG tablet Take 1 mg by mouth every morning. MAY TAKE 2 ADDITIONAL DOSES  AS NEEDED    [provider]  Saline (AYR SALINE NASAL DROPS) 0.65 % SOLN Place 1 spray into the nose daily as needed (congestion). Patient not taking: Reported on 04/11/2021    [provider]  vitamin E 180 MG (400 UNITS) capsule Take 400 Units by mouth daily.    [provider]     Family History  Problem Relation Age of Onset   Colon cancer Neg Hx      Social History   Socioeconomic History   Marital status: Widowed    Spouse name: Not on file   Number of children: Not on file   Years of education: Not on file   Highest education level: Not on file  Occupational History   Not on file  Tobacco Use   Smoking status: Former    Packs/day: 1.00    Years: 40.00    Pack years: 40.00    Types: Cigarettes    Quit date: 08/18/2004    Years since quitting: 16.7   Smokeless tobacco: Never  Vaping Use   Vaping Use: Never used  Substance and Sexual Activity   Alcohol use: No   Drug use: No   Sexual activity: Not Currently    Birth control/protection: Surgical  Other Topics Concern   Not on file  Social History Narrative   Not on file   Social Determinants of Health   Financial Resource Strain: Not on file  Food Insecurity: Not on file  Transportation Needs: Not on file  Physical Activity: Not on file  Stress: Not on file  Social Connections: Not on file    ECOG Status: 1 - Symptomatic but completely ambulatory  Review of Systems  Review of Systems: A 12 point ROS discussed and pertinent positives are indicated in the HPI above.  All other systems are negative.  Physical Exam No direct physical exam was performed (except for noted visual exam findings with Video Visits).   Vital Signs: There were no vitals taken for this visit.  Imaging:  Following examinations reviewed in detail:   Abdominal MRI - 05/17/2021; 09/21/2020; CTA abdomen and pelvis - 11/29/2020  Abdominal MRI performed 05/17/2021 demonstrated mild residual peripheral enhancement about the dominant lesion within the central aspect of the right lobe of the liver which is similar in size measuring approximately 4.2 x 2.8 cm, previously, 4.4 x 2.9 cm, though it demonstrates reduced masslike peripheral or nodular enhancement, finding suggestive of interval treatment response.    There is a peripherally enhancing nodule within the caudal aspect of the right lobe  of the liver measuring approximately 1.2 cm previously, 1.0 cm, previously densely enhancing, finding suggestive of treatment response.   Previous identified ill-defined lesions within the left lobe of the liver are less conspicuous on the present examination.    There has been development of an ill-defined hyperenhancing nodules/lesion within the caudal medial aspect of the right lobe of the liver measuring 0.9 cm (image 39, series 15).    There are two tiny  ill-defined areas of hyperenhancement within the right lobe the liver seen on image 26, series 15.  Constellation of findings are overall suggestive of treatment response (supported by reduction in patient's AFP level) though likely with areas of residual disease which warrant continued surveillance.     MR ABDOMEN WWO CONTRAST  Result Date: 05/20/2021 CLINICAL DATA:  75 year old female with history of hepatocellular carcinoma status post embolization. Follow-up study. EXAM: MRI ABDOMEN WITHOUT AND WITH CONTRAST TECHNIQUE: Multiplanar multisequence MR imaging of the abdomen was performed both before and after the administration of intravenous contrast. CONTRAST:  63mL GADAVIST GADOBUTROL 1 MMOL/ML IV SOLN COMPARISON:  Abdominal MRI 09/21/2020. CT of the abdomen and pelvis 11/29/2020. FINDINGS: Lower chest: Unremarkable. Hepatobiliary: Diffuse heterogeneous T2 signal intensity throughout the hepatic parenchyma, most apparent on fat saturated T2 weighted sequence (series 6). Previously treated lesion in segment 3 of the liver (axial image 12 of series 12) measuring 2.8 x 2.2 cm is T1 hyperintense, centrally T2 hyperintense but peripherally T2 hypointense, with no definite internal enhancement on post gadolinium imaging. In addition, in the central aspect of the liver involving predominantly portions of segment 1 and 8 (axial image 26 of series 15 and coronal image 32 of series 23) there is a irregular-shaped lesion which is difficult to measure,  but estimated to measure approximately 5.1 x 4.1 x 4.1 cm, which is T1 hypointense, predominantly T2 isointense to mildly T2 hyperintense, has several central areas without enhancement, but demonstrates an overall thick peripheral rind of enhancement which becomes more pronounced on delayed post gadolinium imaging. In addition, in the periphery of segment 5 of the liver (axial image 53 of series 15) there is a small T1 hypointense, mildly T2 hyperintense lesion which demonstrates some peripheral arterial phase hyperenhancement with persistence of peripheral enhancement on delayed imaging. New hypervascular focus in the central aspect of segment 5 of the liver (axial image 39 of series 15) measuring 1.1 x 0.9 cm, not well appreciated on precontrast T1 or T2 weighted images. There also several smaller foci of arterial phase hyperenhancement scattered throughout the hepatic parenchyma in both lobes, best appreciated on axial image 25 of series 15, measuring 2-5 mm in size. Pancreas: No pancreatic mass. No pancreatic ductal dilatation. No pancreatic or peripancreatic fluid collections or inflammatory changes. Spleen:  Unremarkable. Adrenals/Urinary Tract: Bilateral kidneys and adrenal glands are normal in appearance. No hydroureteronephrosis in the visualized portions of the abdomen. Stomach/Bowel: Visualized portions are unremarkable. Vascular/Lymphatic: No aneurysm identified in the visualized abdominal vasculature. Portal vein is patent. No lymphadenopathy confidently identified in the abdomen. Other: No significant volume of ascites noted in the visualized portions of the peritoneal cavity. Musculoskeletal: No aggressive appearing osseous lesions are noted in the visualized portions of the skeleton. IMPRESSION: 1. Numerous hepatic lesions, compatible with multifocal hepatocellular carcinoma, as detailed above. The extent of disease (number and size of lesions) appears more pronounced than prior CT of the abdomen and  pelvis 11/29/2020. Electronically Signed   By: Vinnie Langton M.D.   On: 05/20/2021 08:59    Labs:  CBC: Recent Labs    01/18/21 0900 02/15/21 0823 04/11/21 1029 05/17/21 1111  WBC 8.6 6.5 7.1 8.3  HGB 11.5* 12.5 13.7 14.6  HCT 37.1 39.8 42.2 44.8  PLT 263 188 174 153    COAGS: Recent Labs    12/01/20 0929 01/02/21 0820 01/18/21 0900 02/15/21 0823  INR 1.1 1.2 1.1 1.2    BMP: Recent Labs    01/18/21 0900 02/15/21 0823 03/19/21 0944  04/11/21 1029 05/17/21 1111  NA 141 138 140 138 138  K 3.7 4.5 5.2 5.2* 5.3*  CL 105 103 105 103 104  CO2 26 25 27 27 26   GLUCOSE 187* 172* 190* 173* 222*  BUN 23 19 23 20 12   CALCIUM 9.1 9.1 9.3 9.4 9.2  CREATININE 0.97 1.01* 1.14* 1.11* 1.00  GFRNONAA >60 58*  --  52* 58*    LIVER FUNCTION TESTS: Recent Labs    01/18/21 0900 02/15/21 0823 03/19/21 0944 04/11/21 1029 05/17/21 1111  BILITOT 1.2 0.6 0.6 0.9 1.3*  AST 21 23 23  40 37  ALT 18 17 21  48* 30  ALKPHOS 41 41  --  62 87  PROT 7.1 6.6 6.1 6.6 6.8  ALBUMIN 3.4* 3.4*  --  3.2* 3.1*    TUMOR MARKERS:    Assessment and Plan:  Shakerria Parran is a 75 y.o. female with past medical history significant for colon cancer, rheumatoid arthritis, diabetes and hyperlipidemia and biopsy proven HCC, post bland hepatic embolization on 07/24/2020 and bilobar radioembolization (mapping procedure performed on 01/02/2021; Y 90 radioembolization of the left hepatic artery on 01/18/2021 and Y 90 radioembolization of the right hepatic artery on 02/15/2021).  She is currently without complaint.    The following examinations were reviewed in detail:   Abdominal MRI - 05/17/2021; 09/21/2020; CTA abdomen and pelvis - 11/29/2020  Abdominal MRI performed 05/17/2021 demonstrated mild residual peripheral enhancement about the dominant lesion within the central aspect of the right lobe of the liver which is similar in size measuring approximately 4.2 x 2.8 cm, previously, 4.4 x 2.9 cm, though it  demonstrates reduced masslike peripheral or nodular enhancement, finding suggestive of interval treatment response.    There is a peripherally enhancing nodule within the caudal aspect of the right lobe of the liver measuring approximately 1.2 cm previously, 1.0 cm, previously densely enhancing, finding suggestive of treatment response.  Previously identified ill-defined lesions within the left lobe of the liver are less conspicuous on the present examination.    There has been development of an ill-defined hyperenhancing nodules/lesion within the caudal medial aspect of the right lobe of the liver measuring 0.9 cm (image 39, series 15).    There are two tiny ill-defined areas of hyperenhancement within the right lobe the liver seen on image 26, series 15.  Constellation of findings are overall suggestive of treatment response (supported by a significant reduction in patient's AFP level - 891 on 6/3 prior to the Y-90, most recently 12.1 on 9/30) though likely with areas of residual disease which warrant continued surveillance.  As such, would pursue follow-up MRI and AFP level in 3 months (January 2023).  Note, the patient's bilirubin level is mildly elevated at 1.3, likely the result of the bi-lobar Y-90, and should be continued to be monitored.  In regards to the patient's centralized abdominal/lower chest pain while lying down, I explained that occasionally patients are symptomatic following treatment of a liver lesion and as her discomfort is not significantly severe would recommend continued conservative management and observation.  Above discussed at great length with the patient the patient's daughter who is in agreement with the proposed plan of care.  They know to call the interventional radiology clinic with any questions or concerns.  Plan: - Telemedicine consultation following acquisition of AFP, CMP and abdominal MRI in 3 months (January 2023). - Maintain follow-up appointments Dr.  Delton Coombes for consideration of initiation of systemic treatment.  A copy of this report was  sent to the requesting provider on this date.  Electronically Signed: Sandi Mariscal 05/23/2021, 12:06 PM   I spent a total of 10 Minutes in remote  clinical consultation, greater than 50% of which was counseling/coordinating care for [facility].    Visit type: Audio only (telephone). Audio (no video) only due to patient's lack of internet/smartphone capability. Alternative for in-person consultation at [facility], [place] [address], [place], [place]. This visit type was conducted due to national recommendations for restrictions regarding the COVID-19 Pandemic (e.g. social distancing).  This format is felt to be most appropriate for this patient at this time.  All issues noted in this document were discussed and addressed.

## 2021-08-26 ENCOUNTER — Other Ambulatory Visit: Payer: Self-pay | Admitting: Interventional Radiology

## 2021-08-26 ENCOUNTER — Other Ambulatory Visit: Payer: Self-pay | Admitting: Orthopedic Surgery

## 2021-08-26 ENCOUNTER — Other Ambulatory Visit: Payer: Self-pay

## 2021-08-26 DIAGNOSIS — C220 Liver cell carcinoma: Secondary | ICD-10-CM

## 2021-09-10 ENCOUNTER — Encounter (HOSPITAL_BASED_OUTPATIENT_CLINIC_OR_DEPARTMENT_OTHER): Payer: Self-pay | Admitting: Orthopedic Surgery

## 2021-09-10 ENCOUNTER — Other Ambulatory Visit: Payer: Self-pay

## 2021-09-13 ENCOUNTER — Encounter: Payer: Self-pay | Admitting: Emergency Medicine

## 2021-09-13 ENCOUNTER — Ambulatory Visit
Admission: EM | Admit: 2021-09-13 | Discharge: 2021-09-13 | Disposition: A | Discharge status: Home / Self Care | Payer: Medicare Other | Attending: Emergency Medicine | Admitting: Emergency Medicine

## 2021-09-13 ENCOUNTER — Other Ambulatory Visit: Payer: Self-pay

## 2021-09-13 DIAGNOSIS — S81812A Laceration without foreign body, left lower leg, initial encounter: Secondary | ICD-10-CM | POA: Diagnosis not present

## 2021-09-13 MED ORDER — CEPHALEXIN 500 MG PO CAPS: 500.0000 mg | ORAL_CAPSULE | Freq: Four times a day (QID) | ORAL | 0 refills | Status: DC

## 2021-09-13 NOTE — ED Triage Notes (Signed)
Right leg was scraped on concrete step on Monday.  Skin tear noted to right lower leg that has been draining.

## 2021-09-13 NOTE — Discharge Instructions (Addendum)
Carefully clean this with soap and water.  Continue the nonstick dressing and keeping it elevated.  I have placed a referral to the wound care center in [place].  Unfortunately, there is no wound care that I know if available here in [place].  Start the Keflex if it becomes red, with increased temperature and swelling again.  Return here if it does not respond to Keflex.  Go to the ER if you start having fevers, if the infection seems to be spreading rapidly, or for other concerns

## 2021-09-13 NOTE — ED Provider Notes (Signed)
HPI  SUBJECTIVE:  Wanda Curry is a 76 y.o. female who presents with a large skin tear sustained on a concrete step 4 days ago on her distal right lower extremity.  She reports surrounding erythema, increased temperature and swelling yesterday, but states that things "look better today".  She reports serosanguineous drainage, sharp, intermittent pain that is present with holding her leg in a dependent position.  No fevers, body aches, purulent drainage.  She has been elevating her leg and doing local wound care daily.  Elevation helps.  Symptoms are worse with holding her leg in a dependent position.  No antipyretic in the past several days.  She has a past medical history of DVT/PE on Xarelto, chronic kidney disease stage III, diabetes, hepatocellular cancer, on chemotherapy, colon cancer, neuropathy and rheumatoid arthritis.  No history of MRSA, PAD/PVD, hypertension.  PMD: In [place].  She is establishing care in [place] on March 3.    Past Medical History:  Diagnosis Date   Anemia    Arthritis    RA   Cancer ([place])    skin cancer   Depression    Diabetes mellitus without complication (HCC)    Dyspnea    Fibromyalgia    GERD (gastroesophageal reflux disease)    Heart murmur    ECHO scheduled 05-25-2015   Hyperlipidemia    Neuropathy    PONV (postoperative nausea and vomiting)    Puncture wound of great toe     Past Surgical History:  Procedure Laterality Date   ABDOMINAL HYSTERECTOMY     BIOPSY  09/23/2016   Procedure: BIOPSY;  Surgeon: Danie Binder, MD;  [facility];  Service: Endoscopy;;  hepatic flexure mass   CHOLECYSTECTOMY     COLONOSCOPY N/A 08/13/2017   Procedure: COLONOSCOPY;  Surgeon: Daneil Dolin, MD;  [facility];  Service: Endoscopy;  Laterality: N/A;   COLONOSCOPY WITH PROPOFOL N/A 09/23/2016   Procedure: COLONOSCOPY WITH PROPOFOL;  Surgeon: Danie Binder, MD;  [facility];  Service: Endoscopy;  Laterality: N/A;  7:30 am    HARDWARE REMOVAL Right 05/30/2015   Procedure: HARDWARE REMOVAL;  Surgeon: Ninetta Lights, MD;  Location: [place];  Service: Orthopedics;  Laterality: Right;   IR ANGIOGRAM SELECTIVE EACH ADDITIONAL VESSEL  07/25/2019   IR ANGIOGRAM SELECTIVE EACH ADDITIONAL VESSEL  07/25/2019   IR ANGIOGRAM SELECTIVE EACH ADDITIONAL VESSEL  01/02/2021   IR ANGIOGRAM SELECTIVE EACH ADDITIONAL VESSEL  01/02/2021   IR ANGIOGRAM SELECTIVE EACH ADDITIONAL VESSEL  01/02/2021   IR ANGIOGRAM SELECTIVE EACH ADDITIONAL VESSEL  01/18/2021   IR ANGIOGRAM SELECTIVE EACH ADDITIONAL VESSEL  02/15/2021   IR ANGIOGRAM SELECTIVE EACH ADDITIONAL VESSEL  02/15/2021   IR ANGIOGRAM VISCERAL SELECTIVE  07/25/2019   IR ANGIOGRAM VISCERAL SELECTIVE  07/25/2019   IR ANGIOGRAM VISCERAL SELECTIVE  01/02/2021   IR ANGIOGRAM VISCERAL SELECTIVE  01/02/2021   IR ANGIOGRAM VISCERAL SELECTIVE  01/18/2021   IR ANGIOGRAM VISCERAL SELECTIVE  02/15/2021   IR EMBO ARTERIAL NOT HEMORR HEMANG INC GUIDE ROADMAPPING  01/02/2021   IR EMBO TUMOR ORGAN ISCHEMIA INFARCT INC GUIDE ROADMAPPING  07/25/2019   IR EMBO TUMOR ORGAN ISCHEMIA INFARCT INC GUIDE ROADMAPPING  01/18/2021   IR EMBO TUMOR ORGAN ISCHEMIA INFARCT INC GUIDE ROADMAPPING  02/15/2021   IR RADIOLOGIST EVAL & MGMT  06/09/2019   IR RADIOLOGIST EVAL & MGMT  07/07/2019   IR RADIOLOGIST EVAL & MGMT  01/05/2020   IR RADIOLOGIST EVAL & MGMT  04/12/2020  IR RADIOLOGIST EVAL & MGMT  09/27/2020   IR RADIOLOGIST EVAL & MGMT  10/31/2020   IR RADIOLOGIST EVAL & MGMT  12/12/2020   IR RADIOLOGIST EVAL & MGMT  04/04/2021   IR RADIOLOGIST EVAL & MGMT  05/23/2021   IR US GUIDE VASC ACCESS RIGHT  07/25/2019   IR US GUIDE VASC ACCESS RIGHT  01/02/2021   IR US GUIDE VASC ACCESS RIGHT  01/18/2021   IR US GUIDE VASC ACCESS RIGHT  02/15/2021   PARTIAL COLECTOMY N/A 10/10/2016   Procedure: PARTIAL COLECTOMY;  Surgeon: Aviva Signs, MD;  [facility];  Service: General;  Laterality: N/A;   planter warts Bilateral    both feet    POLYPECTOMY  09/23/2016   Procedure: POLYPECTOMY;  Surgeon: Danie Binder, MD;  [facility];  Service: Endoscopy;;  transverse colon polyps times 2, rectal polyp   skin grafts     due to planter warts   TOTAL KNEE ARTHROPLASTY Right 05/30/2015   Procedure: RIGHT TOTAL KNEE ARTHROPLASTY;  Surgeon: Ninetta Lights, MD;  Location: [place];  Service: Orthopedics;  Laterality: Right;    Family History  Problem Relation Age of Onset   Colon cancer Neg Hx     Social History   Tobacco Use   Smoking status: Former    Packs/day: 1.00    Years: 40.00    Pack years: 40.00    Types: Cigarettes    Quit date: 08/18/2004    Years since quitting: 17.0   Smokeless tobacco: Never  Vaping Use   Vaping Use: Never used  Substance Use Topics   Alcohol use: No   Drug use: No    No current facility-administered medications for this encounter.  Current Outpatient Medications:    cephALEXin (KEFLEX) 500 MG capsule, Take 1 capsule (500 mg total) by mouth 4 (four) times daily., Disp: 20 capsule, Rfl: 0   acetaminophen (TYLENOL) 500 MG tablet, Take 500 mg by mouth every 6 (six) hours as needed (for pain.). (Patient not taking: Reported on 04/11/2021), Disp: , Rfl:    APPLE CIDER VINEGAR PO, Take 450 mg by mouth daily., Disp: , Rfl:    Calcium Carbonate-Vitamin D3 600-400 MG-UNIT TABS, Take 1 tablet by mouth daily., Disp: , Rfl:    carvedilol (COREG) 6.25 MG tablet, Take 6.25 mg by mouth 2 (two) times daily with a meal., Disp: , Rfl:    folic acid (FOLVITE) 1 MG tablet, Take 1 mg by mouth daily., Disp: , Rfl:    furosemide (LASIX) 40 MG tablet, Take 20 mg by mouth daily as needed for edema., Disp: , Rfl:    glucosamine-chondroitin 500-400 MG tablet, Take 1 tablet by mouth 3 (three) times daily., Disp: , Rfl:    loperamide (IMODIUM) 2 MG capsule, Take 2 mg by mouth daily., Disp: , Rfl:    losartan (COZAAR) 25 MG tablet, Take 25 mg by mouth daily., Disp: , Rfl:    magnesium gluconate (MAGONATE) 500  MG tablet, Take 500 mg by mouth daily as needed (cramps). (Patient not taking: Reported on 04/11/2021), Disp: , Rfl:    methotrexate (RHEUMATREX) 2.5 MG tablet, Take 20 mg by mouth every Sunday. Caution:Chemotherapy. Protect from light., Disp: , Rfl:    Multiple Vitamins-Minerals (CENTRUM SILVER PO), Take 1 tablet by mouth daily., Disp: , Rfl:    NOVOLOG MIX 70/30 FLEXPEN (70-30) 100 UNIT/ML FlexPen, Inject 20-30 Units into the skin See admin instructions. Take 30 units in the am and Take 20  units in the evening, Disp: , Rfl: 3   omeprazole (PRILOSEC) 40 MG capsule, Take 40 mg by mouth daily. , Disp: , Rfl: 3   rivaroxaban (XARELTO) 20 MG TABS tablet, Take 1 tablet (20 mg total) by mouth daily with supper., Disp: 30 tablet, Rfl: 6   rOPINIRole (REQUIP) 1 MG tablet, Take 1 mg by mouth every morning. MAY TAKE 2 ADDITIONAL DOSES  AS NEEDED, Disp: , Rfl:    Saline (AYR SALINE NASAL DROPS) 0.65 % SOLN, Place 1 spray into the nose daily as needed (congestion). (Patient not taking: Reported on 04/11/2021), Disp: , Rfl:    vitamin E 180 MG (400 UNITS) capsule, Take 400 Units by mouth daily., Disp: , Rfl:   Facility-Administered Medications Ordered in Other Encounters:    0.9 %  sodium chloride infusion, , Intravenous, Continuous, Holley Bouche, NP, Stopped at 09/14/17 1440  Allergies  Allergen Reactions   Xeloda [Capecitabine] Other (See Comments)    Blisters and pain to skin to include feet and arms   Adhesive [Tape]     Adhesive tape and band-aids cause skin irritation   Neosporin Original [Bacitracin-Neomycin-Polymyxin] Other (See Comments)    blister   Sulfa Antibiotics Nausea Only and Other (See Comments)    Joint paint   Erythromycin Itching and Rash    burning     ROS  As noted in HPI.   Physical Exam  BP (!) 153/83 (BP Location: Right Arm)    Pulse 89    Temp 98.6 F (37 C) (Oral)    Resp 18    SpO2 96%   Constitutional: Well developed, well nourished, no acute distress Eyes:   EOMI, conjunctiva normal bilaterally HENT: Normocephalic, atraumatic,mucus membranes moist Respiratory: Normal inspiratory effort Cardiovascular: Normal rate GI: nondistended skin: 5 x 5 cm V-shaped skin tear distal right lower extremity.  No surrounding tenderness, increased temperature, induration, expressible purulent drainage.  Skin flap is firmly adhered to wound.    Musculoskeletal: no deformities Neurologic: Alert & oriented x 3, no focal neuro deficits Psychiatric: Speech and behavior appropriate   ED Course   Medications - No data to display  Orders Placed This Encounter  Procedures   Ambulatory referral to Wound Clinic    Referral Priority:   Routine    Referral Type:   Consultation    Referral Reason:   Specialty Services Required    Requested Specialty:   Wound Care    Number of Visits Requested:   1    No results found for this or any previous visit (from the past 24 hour(s)). No results found.  ED Clinical Impression  1. Skin tear of left lower leg without complication, initial encounter      ED Assessment/Plan  Patient with a large skin tear.  The flap firmly adhered to the base of the wound bed.  It is too late for closure.  Letting heal by secondary intention.  Deferring debridement at this time.  it does not appear to be infected at this time, but they are describing symptoms that are concerning for infection yesterday.  Will send home with a wait-and-see prescription of Keflex.  Continue local wound care, and will place referral to wound care center for supervision as this heals.  I anticipate that this will take some time to heal.  Calculated creatinine clearance from labs and September 22 55 mL/min.  Do not need to renally dose Keflex.  Discussed MDM, treatment plan, and plan for follow-up with patient  and daughter.  Discussed sn/sx that should prompt return to the ED. they agree with plan.   Meds ordered this encounter  Medications   cephALEXin  (KEFLEX) 500 MG capsule    Sig: Take 1 capsule (500 mg total) by mouth 4 (four) times daily.    Dispense:  20 capsule    Refill:  0      *This clinic note was created using [employer]. Therefore, there may be occasional mistakes despite careful proofreading.  ?    Melynda Ripple, MD 09/14/21 [phone]

## 2021-09-19 ENCOUNTER — Ambulatory Visit (HOSPITAL_BASED_OUTPATIENT_CLINIC_OR_DEPARTMENT_OTHER): Admission: RE | Admit: 2021-09-19 | Payer: Medicare Other | Source: Home / Self Care | Admitting: Orthopedic Surgery

## 2021-09-19 SURGERY — REPAIR, NAIL BED
Anesthesia: Regional | Laterality: Right

## 2021-09-19 MED ORDER — BUPIVACAINE HCL (PF) 0.25 % IJ SOLN
INTRAMUSCULAR | Status: AC
  Filled 2021-09-19: qty 30

## 2021-10-15 ENCOUNTER — Inpatient Hospital Stay (HOSPITAL_COMMUNITY): Payer: Medicare Other | Attending: Hematology

## 2021-10-15 ENCOUNTER — Other Ambulatory Visit: Payer: Self-pay

## 2021-10-15 ENCOUNTER — Ambulatory Visit (HOSPITAL_COMMUNITY)
Admission: RE | Admit: 2021-10-15 | Discharge: 2021-10-15 | Disposition: A | Discharge status: Home / Self Care | Payer: Medicare Other | Source: Ambulatory Visit | Attending: Hematology | Admitting: Hematology

## 2021-10-15 DIAGNOSIS — C182 Malignant neoplasm of ascending colon: Secondary | ICD-10-CM | POA: Diagnosis present

## 2021-10-15 DIAGNOSIS — C220 Liver cell carcinoma: Secondary | ICD-10-CM | POA: Insufficient documentation

## 2021-10-15 DIAGNOSIS — E611 Iron deficiency: Secondary | ICD-10-CM | POA: Insufficient documentation

## 2021-10-15 DIAGNOSIS — J439 Emphysema, unspecified: Secondary | ICD-10-CM | POA: Insufficient documentation

## 2021-10-15 DIAGNOSIS — I700 Atherosclerosis of aorta: Secondary | ICD-10-CM | POA: Insufficient documentation

## 2021-10-15 DIAGNOSIS — R188 Other ascites: Secondary | ICD-10-CM | POA: Insufficient documentation

## 2021-10-15 LAB — POCT I-STAT CREATININE: Creatinine, Ser: 0.9 mg/dL (ref 0.44–1.00)

## 2021-10-15 MED ORDER — IOHEXOL 300 MG/ML  SOLN
75.0000 mL | Freq: Once | INTRAMUSCULAR | Status: AC | PRN
  Administered 2021-10-15: 75 mL via INTRAVENOUS

## 2021-10-16 LAB — CEA: CEA: 4.7 ng/mL (ref 0.0–4.7)

## 2021-10-17 ENCOUNTER — Other Ambulatory Visit (HOSPITAL_COMMUNITY): Payer: Self-pay | Admitting: *Deleted

## 2021-10-17 DIAGNOSIS — C220 Liver cell carcinoma: Secondary | ICD-10-CM

## 2021-10-21 ENCOUNTER — Ambulatory Visit (HOSPITAL_COMMUNITY): Payer: Medicare Other | Admitting: Hematology

## 2021-10-21 ENCOUNTER — Inpatient Hospital Stay (HOSPITAL_COMMUNITY): Payer: Medicare Other

## 2021-11-08 ENCOUNTER — Other Ambulatory Visit: Payer: Self-pay

## 2021-11-08 ENCOUNTER — Ambulatory Visit: Payer: Medicare Other | Admitting: Internal Medicine

## 2021-11-08 ENCOUNTER — Encounter: Payer: Self-pay | Admitting: Internal Medicine

## 2021-11-08 VITALS — BP 138/62 | HR 93 | Resp 18 | Ht 63.0 in | Wt 203.2 lb

## 2021-11-08 DIAGNOSIS — I10 Essential (primary) hypertension: Secondary | ICD-10-CM | POA: Diagnosis not present

## 2021-11-08 DIAGNOSIS — C220 Liver cell carcinoma: Secondary | ICD-10-CM | POA: Diagnosis not present

## 2021-11-08 DIAGNOSIS — K219 Gastro-esophageal reflux disease without esophagitis: Secondary | ICD-10-CM

## 2021-11-08 DIAGNOSIS — Z780 Asymptomatic menopausal state: Secondary | ICD-10-CM

## 2021-11-08 DIAGNOSIS — B351 Tinea unguium: Secondary | ICD-10-CM | POA: Insufficient documentation

## 2021-11-08 DIAGNOSIS — N183 Chronic kidney disease, stage 3 unspecified: Secondary | ICD-10-CM | POA: Diagnosis not present

## 2021-11-08 DIAGNOSIS — E1169 Type 2 diabetes mellitus with other specified complication: Secondary | ICD-10-CM | POA: Diagnosis not present

## 2021-11-08 DIAGNOSIS — M069 Rheumatoid arthritis, unspecified: Secondary | ICD-10-CM

## 2021-11-08 DIAGNOSIS — Z1231 Encounter for screening mammogram for malignant neoplasm of breast: Secondary | ICD-10-CM

## 2021-11-08 DIAGNOSIS — Z86711 Personal history of pulmonary embolism: Secondary | ICD-10-CM

## 2021-11-08 DIAGNOSIS — E162 Hypoglycemia, unspecified: Secondary | ICD-10-CM

## 2021-11-08 DIAGNOSIS — Z794 Long term (current) use of insulin: Secondary | ICD-10-CM

## 2021-11-08 DIAGNOSIS — G2581 Restless legs syndrome: Secondary | ICD-10-CM

## 2021-11-08 DIAGNOSIS — K529 Noninfective gastroenteritis and colitis, unspecified: Secondary | ICD-10-CM

## 2021-11-08 MED ORDER — TRESIBA FLEXTOUCH 100 UNIT/ML ~~LOC~~ SOPN: 40.0000 [IU] | PEN_INJECTOR | Freq: Every day | SUBCUTANEOUS | 0 refills | Status: DC

## 2021-11-08 MED ORDER — GVOKE HYPOPEN 2-PACK 1 MG/0.2ML ~~LOC~~ SOAJ: 0.2000 mL | SUBCUTANEOUS | 11 refills | Status: DC | PRN

## 2021-11-08 NOTE — Assessment & Plan Note (Signed)
Of right hand fingernail ?Was going to get surgical debridement with Dr. Fredna Dow, was cancelled due to her foot infection, which is improved now ?

## 2021-11-08 NOTE — Progress Notes (Signed)
? ?New Patient Office Visit ? ?Subjective:  ?Patient ID: Wanda Curry, female    DOB: 1946-06-08  Age: 76 y.o. MRN: [id number] ? ?CC:  ?Chief Complaint  ?Patient presents with  ? New Patient (Initial Visit)  ?  New patient was seeing [facility] in [place]   ? ? ?HPI ?Wanda Curry is a 76 y.o. female with past medical history of HTN, colon cancer s/p hemicolectomy, HCC, RA, type II DM, CKD, PE/DVT and RLS who presents for establishing care. ? ?HTN: She takes losartan and Coreg for it.  Her BP is well controlled currently.  She used to see a cardiologist in [place], and reports history of skipping beats, likely PVCs/PAC.  She currently denies any chest pain, dyspnea or palpitations. ? ?She has history of colon cancer and HCC, for which she sees oncology.  She complains of chronic diarrhea since her hemicolectomy.  She has been taking Imodium as needed for it.  She denies any melena or hematochezia currently.  She also takes omeprazole for GERD/gastritis. ? ?Type II DM: She is currently on NovoLog 70/30 mix, 30 units in the morning and 20 units in the evening.  Her daughter reports that she has had episodes of dizziness, likely due to hypoglycemia.  Her last HbA1c was 7.4 in 01/23.  She agrees to switch to long-acting insulin to avoid episodes of hypoglycemia.  She did not tolerate med for main in the past.  She reports having a leg injury, which led to ulceration, for which she sees podiatrist.  Her wound has been healing well with wound care in [place]. ? ?She sees rheumatology for history of RA and takes methotrexate for it. ? ?She has history of DVT/PE and takes Xarelto currently.  Denies any active signs of bleeding currently, but does have easy bruising due to [facility]. ? ?She has chronic left leg swelling, L > R.  She tries leg elevation and takes as needed Lasix for it. ? ? ? ?Past Medical History:  ?Diagnosis Date  ? Acute pulmonary embolism ([place]) 04/15/2017  ? Anemia   ? Arthritis   ? RA  ?  Cancer [facility])   ? skin cancer  ? Depression   ? Diabetes mellitus without complication ([place])   ? Dyspnea   ? Fibromyalgia   ? GERD (gastroesophageal reflux disease)   ? GI bleed 08/12/2017  ? Heart murmur   ? ECHO scheduled 05-25-2015  ? Hyperlipidemia   ? Neuropathy   ? PONV (postoperative nausea and vomiting)   ? Puncture wound of great toe   ? Right leg DVT ([place]) 12/01/2020  ? ? ?Past Surgical History:  ?Procedure Laterality Date  ? ABDOMINAL HYSTERECTOMY    ? BIOPSY  09/23/2016  ? Procedure: BIOPSY;  Surgeon: Danie Binder, MD;  [facility];  Service: Endoscopy;;  hepatic flexure mass  ? CHOLECYSTECTOMY    ? COLONOSCOPY N/A 08/13/2017  ? Procedure: COLONOSCOPY;  Surgeon: Daneil Dolin, MD;  [facility];  Service: Endoscopy;  Laterality: N/A;  ? COLONOSCOPY WITH PROPOFOL N/A 09/23/2016  ? Procedure: COLONOSCOPY WITH PROPOFOL;  Surgeon: Danie Binder, MD;  [facility];  Service: Endoscopy;  Laterality: N/A;  7:30 am  ? HARDWARE REMOVAL Right 05/30/2015  ? Procedure: HARDWARE REMOVAL;  Surgeon: Ninetta Lights, MD;  Location: [place];  Service: Orthopedics;  Laterality: Right;  ? IR ANGIOGRAM SELECTIVE EACH ADDITIONAL VESSEL  07/25/2019  ? IR ANGIOGRAM SELECTIVE EACH ADDITIONAL VESSEL  07/25/2019  ? IR ANGIOGRAM SELECTIVE EACH ADDITIONAL VESSEL  01/02/2021  ? IR ANGIOGRAM SELECTIVE EACH ADDITIONAL VESSEL  01/02/2021  ? IR ANGIOGRAM SELECTIVE EACH ADDITIONAL VESSEL  01/02/2021  ? IR ANGIOGRAM SELECTIVE EACH ADDITIONAL VESSEL  01/18/2021  ? IR ANGIOGRAM SELECTIVE EACH ADDITIONAL VESSEL  02/15/2021  ? IR ANGIOGRAM SELECTIVE EACH ADDITIONAL VESSEL  02/15/2021  ? IR ANGIOGRAM VISCERAL SELECTIVE  07/25/2019  ? IR ANGIOGRAM VISCERAL SELECTIVE  07/25/2019  ? IR ANGIOGRAM VISCERAL SELECTIVE  01/02/2021  ? IR ANGIOGRAM VISCERAL SELECTIVE  01/02/2021  ? IR ANGIOGRAM VISCERAL SELECTIVE  01/18/2021  ? IR ANGIOGRAM VISCERAL SELECTIVE  02/15/2021  ? IR EMBO ARTERIAL NOT HEMORR HEMANG INC GUIDE ROADMAPPING  01/02/2021   ? IR EMBO TUMOR ORGAN ISCHEMIA INFARCT INC GUIDE ROADMAPPING  07/25/2019  ? IR EMBO TUMOR ORGAN ISCHEMIA INFARCT INC GUIDE ROADMAPPING  01/18/2021  ? IR EMBO TUMOR ORGAN ISCHEMIA INFARCT INC GUIDE ROADMAPPING  02/15/2021  ? IR RADIOLOGIST EVAL & MGMT  06/09/2019  ? IR RADIOLOGIST EVAL & MGMT  07/07/2019  ? IR RADIOLOGIST EVAL & MGMT  01/05/2020  ? IR RADIOLOGIST EVAL & MGMT  04/12/2020  ? IR RADIOLOGIST EVAL & MGMT  09/27/2020  ? IR RADIOLOGIST EVAL & MGMT  10/31/2020  ? IR RADIOLOGIST EVAL & MGMT  12/12/2020  ? IR RADIOLOGIST EVAL & MGMT  04/04/2021  ? IR RADIOLOGIST EVAL & MGMT  05/23/2021  ? IR US GUIDE VASC ACCESS RIGHT  07/25/2019  ? IR US GUIDE VASC ACCESS RIGHT  01/02/2021  ? IR US GUIDE VASC ACCESS RIGHT  01/18/2021  ? IR US GUIDE VASC ACCESS RIGHT  02/15/2021  ? PARTIAL COLECTOMY N/A 10/10/2016  ? Procedure: PARTIAL COLECTOMY;  Surgeon: Aviva Signs, MD;  [facility];  Service: General;  Laterality: N/A;  ? planter warts Bilateral   ? both feet  ? POLYPECTOMY  09/23/2016  ? Procedure: POLYPECTOMY;  Surgeon: Danie Binder, MD;  [facility];  Service: Endoscopy;;  transverse colon polyps times 2, rectal polyp  ? skin grafts    ? due to planter warts  ? TOTAL KNEE ARTHROPLASTY Right 05/30/2015  ? Procedure: RIGHT TOTAL KNEE ARTHROPLASTY;  Surgeon: Ninetta Lights, MD;  Location: [place];  Service: Orthopedics;  Laterality: Right;  ? ? ?Family History  ?Problem Relation Age of Onset  ? Colon cancer Neg Hx   ? ? ?Social History  ? ?Socioeconomic History  ? Marital status: Widowed  ?  Spouse name: Not on file  ? Number of children: Not on file  ? Years of education: Not on file  ? Highest education level: Not on file  ?Occupational History  ? Not on file  ?Tobacco Use  ? Smoking status: Former  ?  Packs/day: 1.00  ?  Years: 40.00  ?  Pack years: 40.00  ?  Types: Cigarettes  ?  Quit date: 08/18/2004  ?  Years since quitting: 17.2  ? Smokeless tobacco: Never  ?Vaping Use  ? Vaping Use: Never used  ?Substance and Sexual  Activity  ? Alcohol use: No  ? Drug use: No  ? Sexual activity: Not Currently  ?  Birth control/protection: Surgical  ?Other Topics Concern  ? Not on file  ?Social History Narrative  ? Not on file  ? ?Social Determinants of Health  ? ?Financial Resource Strain: Not on file  ?Food Insecurity: Not on file  ?Transportation Needs: Not on file  ?Physical Activity: Not on file  ?Stress:  Not on file  ?Social Connections: Not on file  ?Intimate Partner Violence: Not on file  ? ? ?ROS ?Review of Systems  ?Constitutional:  Positive for fatigue. Negative for chills and fever.  ?HENT:  Negative for congestion, sinus pressure, sinus pain and sore throat.   ?Eyes:  Negative for pain and discharge.  ?Respiratory:  Negative for cough and shortness of breath.   ?Cardiovascular:  Positive for leg swelling. Negative for chest pain and palpitations.  ?Gastrointestinal:  Positive for diarrhea. Negative for abdominal pain, nausea and vomiting.  ?Endocrine: Negative for polydipsia and polyuria.  ?Genitourinary:  Negative for dysuria and hematuria.  ?Musculoskeletal:  Positive for arthralgias, back pain and gait problem. Negative for neck pain and neck stiffness.  ?Skin:  Positive for color change.  ?Neurological:  Positive for dizziness and weakness.  ?Psychiatric/Behavioral:  Negative for agitation and behavioral problems.   ? ?Objective:  ? ?Today's Vitals: BP 138/62 (BP Location: Left Arm, Patient Position: Sitting, Cuff Size: Normal)   Pulse 93   Resp 18   Ht '5\' 3"'$  (1.6 m)   Wt 203 lb 3.2 oz (92.2 kg)   SpO2 98%   BMI 36.00 kg/m?  ? ?Physical Exam ?Vitals reviewed.  ?Constitutional:   ?   General: She is not in acute distress. ?   Appearance: She is not diaphoretic.  ?HENT:  ?   Head: Normocephalic and atraumatic.  ?   Nose: Nose normal.  ?   Mouth/Throat:  ?   Mouth: Mucous membranes are moist.  ?Eyes:  ?   General: No scleral icterus. ?   Extraocular Movements: Extraocular movements intact.  ?Cardiovascular:  ?   Rate and  Rhythm: Normal rate and regular rhythm.  ?   Pulses: Normal pulses.  ?   Heart sounds: Normal heart sounds. No murmur heard. ?Pulmonary:  ?   Breath sounds: Normal breath sounds. No wheezing or rales.  ?Abdominal

## 2021-11-08 NOTE — Assessment & Plan Note (Signed)
On Xarelto 10 mg daily ?Followed by heme-onc ?

## 2021-11-08 NOTE — Assessment & Plan Note (Signed)
Lab Results  ?Component Value Date  ? HGBA1C 6.5 (H) 11/29/2020  ? ?Recent HbA1C 7.4 in 01/23 ?On NovoLog 70/30 - 30 U QAM and 20 QPM, reports episodes of hypoglycemia at times ?Prefer to switch to long acting insulin to avoid hypoglycemia episodes, started [place] 40 units daily ?Did not tolerate metformin in the past ?Advised to follow diabetic diet ?On ARB ?F/u CMP and lipid panel ?Diabetic foot exam: Today ?Diabetic eye exam: Advised to follow up with Ophthalmology for diabetic eye exam ?

## 2021-11-08 NOTE — Assessment & Plan Note (Signed)
Well controlled with ropinirole ?

## 2021-11-08 NOTE — Patient Instructions (Signed)
Please start taking Tresiba 40 U at nighttime. Please continue Novolog until you receive Tresiba. Please take only 20 U of Novolog in the morning if blood glucose is less than 100. ? ?Please avoid skipping any meals. ? ?Please continue taking other medications as prescribed. ? ?Please get fasting blood tests done before the next visit. ?

## 2021-11-08 NOTE — Assessment & Plan Note (Signed)
Takes methotrexate ?Followed by Dr. Scarlette Shorts in [place] ?

## 2021-11-08 NOTE — Assessment & Plan Note (Signed)
Well-controlled with Omeprazole 

## 2021-11-08 NOTE — Assessment & Plan Note (Signed)
Check stool GI profile ?Denies any hematochezia ?Referred to GI ?Takes Imodium as needed for IBS-D ?

## 2021-11-08 NOTE — Assessment & Plan Note (Signed)
BP Readings from Last 1 Encounters:  ?11/08/21 138/62  ? ?Well-controlled with Losartan and Coreg ?Counseled for compliance with the medications ?Advised DASH diet and moderate exercise/walking as tolerated ?

## 2021-11-08 NOTE — Assessment & Plan Note (Signed)
Will check BMP ?Chart review suggests history of CKD stage IIIa ?Avoid nephrotoxic agents ?On losartan and as needed Lasix currently ?

## 2021-11-08 NOTE — Assessment & Plan Note (Signed)
Followed by oncology History of colon cancer s/p hemicolectomy 

## 2021-11-11 ENCOUNTER — Encounter: Payer: Self-pay | Admitting: Internal Medicine

## 2021-11-12 ENCOUNTER — Ambulatory Visit: Payer: Medicare Other

## 2021-11-12 ENCOUNTER — Other Ambulatory Visit: Payer: Self-pay

## 2021-11-12 LAB — HM DIABETES EYE EXAM

## 2021-11-14 ENCOUNTER — Ambulatory Visit (HOSPITAL_COMMUNITY): Payer: Medicare Other

## 2021-11-14 ENCOUNTER — Other Ambulatory Visit (HOSPITAL_COMMUNITY): Payer: Medicare Other

## 2021-11-14 LAB — GI PROFILE, STOOL, PCR

## 2021-11-19 ENCOUNTER — Inpatient Hospital Stay (HOSPITAL_COMMUNITY): Payer: Medicare Other | Attending: Hematology

## 2021-11-19 DIAGNOSIS — C220 Liver cell carcinoma: Secondary | ICD-10-CM | POA: Insufficient documentation

## 2021-11-19 DIAGNOSIS — Z79899 Other long term (current) drug therapy: Secondary | ICD-10-CM | POA: Diagnosis not present

## 2021-11-19 DIAGNOSIS — R197 Diarrhea, unspecified: Secondary | ICD-10-CM | POA: Diagnosis not present

## 2021-11-20 LAB — AFP TUMOR MARKER: AFP, Serum, Tumor Marker: 891 ng/mL — ABNORMAL HIGH (ref 0.0–9.2)

## 2021-11-25 ENCOUNTER — Ambulatory Visit (HOSPITAL_COMMUNITY)
Admission: RE | Admit: 2021-11-25 | Discharge: 2021-11-25 | Disposition: A | Discharge status: Home / Self Care | Payer: Medicare Other | Source: Ambulatory Visit | Attending: Internal Medicine | Admitting: Internal Medicine

## 2021-11-25 ENCOUNTER — Encounter (HOSPITAL_COMMUNITY): Payer: Self-pay

## 2021-11-25 DIAGNOSIS — Z780 Asymptomatic menopausal state: Secondary | ICD-10-CM | POA: Diagnosis not present

## 2021-11-25 DIAGNOSIS — M85851 Other specified disorders of bone density and structure, right thigh: Secondary | ICD-10-CM | POA: Insufficient documentation

## 2021-11-25 DIAGNOSIS — E119 Type 2 diabetes mellitus without complications: Secondary | ICD-10-CM | POA: Diagnosis not present

## 2021-11-25 DIAGNOSIS — Z1231 Encounter for screening mammogram for malignant neoplasm of breast: Secondary | ICD-10-CM | POA: Insufficient documentation

## 2021-11-25 DIAGNOSIS — C801 Malignant (primary) neoplasm, unspecified: Secondary | ICD-10-CM

## 2021-11-25 DIAGNOSIS — Z794 Long term (current) use of insulin: Secondary | ICD-10-CM | POA: Diagnosis not present

## 2021-11-25 DIAGNOSIS — Z13820 Encounter for screening for osteoporosis: Secondary | ICD-10-CM | POA: Diagnosis present

## 2021-11-26 ENCOUNTER — Inpatient Hospital Stay (HOSPITAL_COMMUNITY): Payer: Medicare Other | Admitting: Hematology

## 2021-11-26 ENCOUNTER — Ambulatory Visit (HOSPITAL_COMMUNITY): Payer: Medicare Other | Admitting: Hematology

## 2021-11-26 ENCOUNTER — Encounter (HOSPITAL_COMMUNITY): Payer: Self-pay | Admitting: Hematology

## 2021-11-26 VITALS — BP 150/75 | HR 77 | Temp 97.0°F | Resp 18 | Ht 63.0 in | Wt 202.3 lb

## 2021-11-26 DIAGNOSIS — C220 Liver cell carcinoma: Secondary | ICD-10-CM

## 2021-11-26 DIAGNOSIS — R42 Dizziness and giddiness: Secondary | ICD-10-CM | POA: Diagnosis not present

## 2021-11-26 DIAGNOSIS — C182 Malignant neoplasm of ascending colon: Secondary | ICD-10-CM

## 2021-11-26 NOTE — Patient Instructions (Signed)
Wanda Curry at [facility] ?Discharge Instructions ? ? ?You were seen and examined today by Dr. Delton Coombes. ? ?He reviewed the results of your CT scan and your lab work.  The CT scan looked okay, but your AFP tumor marker has gone up to 891.  ? ?We will schedule for MRI of your abdomen and also of your brain to investigate this dizziness further.  ? ?Return as scheduled.  ? ? ?Thank you for choosing [facility] to provide your oncology and hematology care.  To afford each patient quality time with our provider, please arrive at least 15 minutes before your scheduled appointment time.  ? ?If you have a lab appointment with the [facility] please come in thru the Main Entrance and check in at the main information desk. ? ?You need to re-schedule your appointment should you arrive 10 or more minutes late.  We strive to give you quality time with our providers, and arriving late affects you and other patients whose appointments are after yours.  Also, if you no show three or more times for appointments you may be dismissed from the clinic at the providers discretion.     ?Again, thank you for choosing [facility].  Our hope is that these requests will decrease the amount of time that you wait before being seen by our physicians.       ?_____________________________________________________________ ? ?Should you have questions after your visit to [facility], please contact our office at [phone] and follow the prompts.  Our office hours are 8:00 a.m. and 4:30 p.m. Monday - Friday.  Please note that voicemails left after 4:00 p.m. may not be returned until the following business day.  We are closed weekends and major holidays.  You do have access to a nurse 24-7, just call the main number to the clinic [phone] and do not press any options, hold on the line and a nurse will answer the phone.   ? ?For prescription refill  requests, have your pharmacy contact our office and allow 72 hours.   ? ?Due to Covid, you will need to wear a mask upon entering the hospital. If you do not have a mask, a mask will be given to you at the Main Entrance upon arrival. For doctor visits, patients may have 1 support person age 36 or older with them. For treatment visits, patients can not have anyone with them due to social distancing guidelines and our immunocompromised population.  ? ?   ?

## 2021-11-26 NOTE — Progress Notes (Signed)
? ?[facility] ?[address] ?[place], [place] [address] ? ? ?CLINIC:  ?Medical Oncology/Hematology ? ?PCP:  ?Lindell Spar, MD ?[address] / [place] [address] ?[phone] ? ? ?REASON FOR VISIT:  ?Follow-up for right colon cancer, hepatocellular carcinoma and iron deficiency state ? ?PRIOR THERAPY:  ?1. Right hemicolectomy on 10/10/2016. ?2. Intermittent Feraheme last on 09/22/2018. ?3. Bland embolization of hepatic lesion on 07/25/2019. ? ?NGS Results: not done ? ?CURRENT THERAPY: surveillance ? ?BRIEF ONCOLOGIC HISTORY:  ?Oncology History  ?Colon cancer ([place])  ?10/10/2016 Initial Diagnosis  ? Colon cancer [facility]) ?  ?10/10/2016 Surgery  ? Partial colectomy by Dr. Arnoldo Morale  ?Pathology shows  a 2.1 cm grade 2 adenocarcinoma of the ascending colon with invasion through the muscularis propria into the peri-colorectal tissues.  ? ? ?  ?11/17/2016 PET scan  ? The hepatic lesion seen on the CT scan and MRI does not demonstrate ?hypermetabolism and is likely a benign entity. ?  ?Right maxillary sinus disease with associated hypermetabolism. ?  ?Scattered pulmonary nodules, likely benign. No follow-up needed if ?patient is low-risk (and has no known or suspected primary ?neoplasm). Non-contrast chest CT can be considered in 12 months if ?patient is high-risk.  ?  ?12/25/2016 - 02/04/2017 Adjuvant Chemotherapy  ? Xeloda '2000mg'$  PO BID take for 14 days out of 21 days. Plan for total of 24 weeks of treatment. ?  ? ? ?CANCER STAGING: ? Cancer Staging  ?Colon cancer ([place]) ?Staging form: Colon and Rectum, AJCC 8th Edition ?- Clinical: Stage IIA (cT3, cN0, cM0) - Signed by [name], MD on 11/26/2016 ? ? ?INTERVAL HISTORY:  ?[name], a 76 y.o. female, returns for routine follow-up of her right colon cancer, hepatocellular carcinoma and iron deficiency state. Dariah was last seen on 04/11/2021.  ? ?Today she reports feeling well. She reports dizziness and light headedness upon standing which has been  present intermittently for years. She does not monitor her BP at home. She denies headaches and vision changes. She is eating well. She reports abdominal pain with eating. She is able to cook for herself. She reports a fall on Sunday while cleaning.  ? ?REVIEW OF SYSTEMS:  ?Review of Systems  ?Constitutional:  Negative for appetite change.  ?Eyes:  Negative for eye problems.  ?Gastrointestinal:  Positive for abdominal pain.  ?Neurological:  Positive for dizziness and light-headedness. Negative for headaches.  ?All other systems reviewed and are negative. ? ?PAST MEDICAL/SURGICAL HISTORY:  ?Past Medical History:  ?Diagnosis Date  ? Acute pulmonary embolism ([place]) 04/15/2017  ? Anemia   ? Arthritis   ? RA  ? Cancer [facility])   ? skin cancer, colon, liver  ? Depression   ? Diabetes mellitus without complication ([place])   ? Dyspnea   ? Fibromyalgia   ? GERD (gastroesophageal reflux disease)   ? GI bleed 08/12/2017  ? Heart murmur   ? ECHO scheduled 05-25-2015  ? Hyperlipidemia   ? Neuropathy   ? PONV (postoperative nausea and vomiting)   ? Puncture wound of great toe   ? Right leg DVT ([place]) 12/01/2020  ? ?Past Surgical History:  ?Procedure Laterality Date  ? ABDOMINAL HYSTERECTOMY    ? BIOPSY  09/23/2016  ? Procedure: BIOPSY;  Surgeon: Danie Binder, MD;  [facility];  Service: Endoscopy;;  hepatic flexure mass  ? CHOLECYSTECTOMY    ? COLONOSCOPY N/A 08/13/2017  ? Procedure: COLONOSCOPY;  Surgeon: Daneil Dolin, MD;  [facility];  Service: Endoscopy;  Laterality: N/A;  ? COLONOSCOPY WITH PROPOFOL N/A 09/23/2016  ? Procedure: COLONOSCOPY WITH PROPOFOL;  Surgeon: Danie Binder, MD;  [facility];  Service: Endoscopy;  Laterality: N/A;  7:30 am  ? HARDWARE REMOVAL Right 05/30/2015  ? Procedure: HARDWARE REMOVAL;  Surgeon: Ninetta Lights, MD;  Location: [facility];  Service: Orthopedics;  Laterality: Right;  ? IR ANGIOGRAM SELECTIVE EACH ADDITIONAL VESSEL  07/25/2019  ? IR ANGIOGRAM SELECTIVE EACH  ADDITIONAL VESSEL  07/25/2019  ? IR ANGIOGRAM SELECTIVE EACH ADDITIONAL VESSEL  01/02/2021  ? IR ANGIOGRAM SELECTIVE EACH ADDITIONAL VESSEL  01/02/2021  ? IR ANGIOGRAM SELECTIVE EACH ADDITIONAL VESSEL  01/02/2021  ? IR ANGIOGRAM SELECTIVE EACH ADDITIONAL VESSEL  01/18/2021  ? IR ANGIOGRAM SELECTIVE EACH ADDITIONAL VESSEL  02/15/2021  ? IR ANGIOGRAM SELECTIVE EACH ADDITIONAL VESSEL  02/15/2021  ? IR ANGIOGRAM VISCERAL SELECTIVE  07/25/2019  ? IR ANGIOGRAM VISCERAL SELECTIVE  07/25/2019  ? IR ANGIOGRAM VISCERAL SELECTIVE  01/02/2021  ? IR ANGIOGRAM VISCERAL SELECTIVE  01/02/2021  ? IR ANGIOGRAM VISCERAL SELECTIVE  01/18/2021  ? IR ANGIOGRAM VISCERAL SELECTIVE  02/15/2021  ? IR EMBO ARTERIAL NOT HEMORR HEMANG INC GUIDE ROADMAPPING  01/02/2021  ? IR EMBO TUMOR ORGAN ISCHEMIA INFARCT INC GUIDE ROADMAPPING  07/25/2019  ? IR EMBO TUMOR ORGAN ISCHEMIA INFARCT INC GUIDE ROADMAPPING  01/18/2021  ? IR EMBO TUMOR ORGAN ISCHEMIA INFARCT INC GUIDE ROADMAPPING  02/15/2021  ? IR RADIOLOGIST EVAL & MGMT  06/09/2019  ? IR RADIOLOGIST EVAL & MGMT  07/07/2019  ? IR RADIOLOGIST EVAL & MGMT  01/05/2020  ? IR RADIOLOGIST EVAL & MGMT  04/12/2020  ? IR RADIOLOGIST EVAL & MGMT  09/27/2020  ? IR RADIOLOGIST EVAL & MGMT  10/31/2020  ? IR RADIOLOGIST EVAL & MGMT  12/12/2020  ? IR RADIOLOGIST EVAL & MGMT  04/04/2021  ? IR RADIOLOGIST EVAL & MGMT  05/23/2021  ? IR US GUIDE VASC ACCESS RIGHT  07/25/2019  ? IR US GUIDE VASC ACCESS RIGHT  01/02/2021  ? IR US GUIDE VASC ACCESS RIGHT  01/18/2021  ? IR US GUIDE VASC ACCESS RIGHT  02/15/2021  ? PARTIAL COLECTOMY N/A 10/10/2016  ? Procedure: PARTIAL COLECTOMY;  Surgeon: Aviva Signs, MD;  [facility];  Service: General;  Laterality: N/A;  ? planter warts Bilateral   ? both feet  ? POLYPECTOMY  09/23/2016  ? Procedure: POLYPECTOMY;  Surgeon: Danie Binder, MD;  [facility];  Service: Endoscopy;;  transverse colon polyps times 2, rectal polyp  ? skin grafts    ? due to planter warts  ? TOTAL KNEE ARTHROPLASTY Right 05/30/2015   ? Procedure: RIGHT TOTAL KNEE ARTHROPLASTY;  Surgeon: Ninetta Lights, MD;  Location: [facility];  Service: Orthopedics;  Laterality: Right;  ? ? ?SOCIAL HISTORY:  ?Social History  ? ?Socioeconomic History  ? Marital status: Widowed  ?  Spouse name: Not on file  ? Number of children: Not on file  ? Years of education: Not on file  ? Highest education level: Not on file  ?Occupational History  ? Not on file  ?Tobacco Use  ? Smoking status: Former  ?  Packs/day: 1.00  ?  Years: 40.00  ?  Pack years: 40.00  ?  Types: Cigarettes  ?  Quit date: 08/18/2004  ?  Years since quitting: 17.2  ? Smokeless tobacco: Never  ?Vaping Use  ? Vaping Use: Never used  ?Substance and Sexual Activity  ?  Alcohol use: No  ? Drug use: No  ? Sexual activity: Not Currently  ?  Birth control/protection: Surgical  ?Other Topics Concern  ? Not on file  ?Social History Narrative  ? Not on file  ? ?Social Determinants of Health  ? ?Financial Resource Strain: Not on file  ?Food Insecurity: Not on file  ?Transportation Needs: Not on file  ?Physical Activity: Not on file  ?Stress: Not on file  ?Social Connections: Not on file  ?Intimate Partner Violence: Not on file  ? ? ?FAMILY HISTORY:  ?Family History  ?Problem Relation Age of Onset  ? Breast cancer Mother   ? Breast cancer Daughter   ? Colon cancer Neg Hx   ? ? ?CURRENT MEDICATIONS:  ?Current Outpatient Medications  ?Medication Sig Dispense Refill  ? acetaminophen (TYLENOL) 500 MG tablet Take 500 mg by mouth every 6 (six) hours as needed (for pain.).    ? APPLE CIDER VINEGAR PO Take 450 mg by mouth daily.    ? Calcium Carbonate-Vitamin D3 600-400 MG-UNIT TABS Take 1 tablet by mouth daily.    ? carvedilol (COREG) 6.25 MG tablet Take 6.25 mg by mouth 2 (two) times daily with a meal.    ? cephALEXin (KEFLEX) 500 MG capsule Take 1 capsule (500 mg total) by mouth 4 (four) times daily. 20 capsule 0  ? folic acid (FOLVITE) 1 MG tablet Take 1 mg by mouth daily.    ? furosemide (LASIX) 40 MG tablet Take 20 mg by  mouth daily as needed for edema.    ? gabapentin (NEURONTIN) 600 MG tablet Take 600 mg by mouth daily.    ? Glucagon (GVOKE HYPOPEN 2-PACK) 1 MG/0.2ML SOAJ Inject 0.2 mLs into the skin as needed (Blood g

## 2021-12-02 ENCOUNTER — Telehealth: Payer: Medicare Other

## 2021-12-12 ENCOUNTER — Ambulatory Visit (HOSPITAL_COMMUNITY)
Admission: RE | Admit: 2021-12-12 | Discharge: 2021-12-12 | Disposition: A | Discharge status: Home / Self Care | Payer: Medicare Other | Source: Ambulatory Visit | Attending: Hematology | Admitting: Hematology

## 2021-12-12 ENCOUNTER — Ambulatory Visit (HOSPITAL_COMMUNITY)
Admission: RE | Admit: 2021-12-12 | Discharge: 2021-12-12 | Disposition: A | Discharge status: Home / Self Care | Payer: Medicare Other | Source: Ambulatory Visit | Attending: Interventional Radiology | Admitting: Interventional Radiology

## 2021-12-12 DIAGNOSIS — C220 Liver cell carcinoma: Secondary | ICD-10-CM

## 2021-12-12 DIAGNOSIS — R42 Dizziness and giddiness: Secondary | ICD-10-CM | POA: Insufficient documentation

## 2021-12-12 MED ORDER — GADOBUTROL 1 MMOL/ML IV SOLN
9.0000 mL | Freq: Once | INTRAVENOUS | Status: AC | PRN
  Administered 2021-12-12: 9 mL via INTRAVENOUS

## 2021-12-17 ENCOUNTER — Encounter: Payer: Self-pay | Admitting: *Deleted

## 2021-12-17 ENCOUNTER — Ambulatory Visit
Admission: RE | Admit: 2021-12-17 | Discharge: 2021-12-17 | Disposition: A | Discharge status: Home / Self Care | Payer: Medicare Other | Source: Ambulatory Visit | Attending: Interventional Radiology | Admitting: Interventional Radiology

## 2021-12-17 DIAGNOSIS — C220 Liver cell carcinoma: Secondary | ICD-10-CM

## 2021-12-17 HISTORY — PX: IR RADIOLOGIST EVAL & MGMT: IMG5224

## 2021-12-17 NOTE — Progress Notes (Signed)
Patient ID: Wanda Curry, female   DOB: 02-10-1946, 76 y.o.   MRN: [id number] ? ?    ? ? ?Chief Complaint: ?[place]; post bland embolization on 07/24/2020 as well as bilobar Y-90 Radioembolization (completion treatment of the right lobe of the liver on 02/15/2021) ?  ?Referring Physician(s): ?Delton Coombes ?  ?History of Present Illness: ?Wanda Curry is a 75 y.o. female with past medical history significant for colon cancer, rheumatoid arthritis, diabetes and hyperlipidemia who was found to have an indeterminate slowly growing lesion within the central aspect of the liver for which he ultimately underwent ultrasound-guided liver biopsy on 05/11/2019 with pathologic diagnosis of well to moderately differentiated hepatocellular carcinoma, ultimately undergoing a bland hepatic embolization on 07/24/2020. Unfortunately, subsequent surveillance imaging demonstrated developed bilobar disease, confirmed by ultrasound-guided biopsy hypervascular lesion within the lateral segment of the left lobe of the liver performed 11/29/2020 (note, procedure complicated by development intraparenchymal and a subcapsular hematoma requiring a prolonged admission but without dedicated intervention) for which patient ultimately underwent bilobar radioembolization (mapping procedure performed on 01/02/2021; Y 90 radioembolization of the left hepatic artery on 01/18/2021 and Y 90 radioembolization of the right hepatic artery on 02/15/2021). ? ?The patient is seen today in telemedicine consultation for evaluation and management following acquisition of postprocedural AFP level and MRI performed 12/12/2021.  She is accompanied on the phone call by her daughter. ? ?Patient reports acute on chronic dizziness resulting in a fall this past Easter.  Patient's daughter also reports patient has experienced intermittent confusion. ? ?Patient occasionally reports intermittent upper abdominal pain and reduced appetite. ? ?No yellowing of the skin or eyes.  No bloody  or melancholic stools. ? ? ? ?Past Medical History:  ?Diagnosis Date  ? Acute pulmonary embolism ([place]) 04/15/2017  ? Anemia   ? Arthritis   ? RA  ? Cancer [facility])   ? skin cancer, colon, liver  ? Depression   ? Diabetes mellitus without complication ([place])   ? Dyspnea   ? Fibromyalgia   ? GERD (gastroesophageal reflux disease)   ? GI bleed 08/12/2017  ? Heart murmur   ? ECHO scheduled 05-25-2015  ? Hyperlipidemia   ? Neuropathy   ? PONV (postoperative nausea and vomiting)   ? Puncture wound of great toe   ? Right leg DVT ([place]) 12/01/2020  ? ? ?Past Surgical History:  ?Procedure Laterality Date  ? ABDOMINAL HYSTERECTOMY    ? BIOPSY  09/23/2016  ? Procedure: BIOPSY;  Surgeon: Danie Binder, MD;  [facility];  Service: Endoscopy;;  hepatic flexure mass  ? CHOLECYSTECTOMY    ? COLONOSCOPY N/A 08/13/2017  ? Procedure: COLONOSCOPY;  Surgeon: Daneil Dolin, MD;  [facility];  Service: Endoscopy;  Laterality: N/A;  ? COLONOSCOPY WITH PROPOFOL N/A 09/23/2016  ? Procedure: COLONOSCOPY WITH PROPOFOL;  Surgeon: Danie Binder, MD;  [facility];  Service: Endoscopy;  Laterality: N/A;  7:30 am  ? HARDWARE REMOVAL Right 05/30/2015  ? Procedure: HARDWARE REMOVAL;  Surgeon: Ninetta Lights, MD;  Location: [facility];  Service: Orthopedics;  Laterality: Right;  ? IR ANGIOGRAM SELECTIVE EACH ADDITIONAL VESSEL  07/25/2019  ? IR ANGIOGRAM SELECTIVE EACH ADDITIONAL VESSEL  07/25/2019  ? IR ANGIOGRAM SELECTIVE EACH ADDITIONAL VESSEL  01/02/2021  ? IR ANGIOGRAM SELECTIVE EACH ADDITIONAL VESSEL  01/02/2021  ? IR ANGIOGRAM SELECTIVE EACH ADDITIONAL VESSEL  01/02/2021  ? IR ANGIOGRAM SELECTIVE EACH ADDITIONAL VESSEL  01/18/2021  ? IR ANGIOGRAM SELECTIVE EACH ADDITIONAL VESSEL  02/15/2021  ? IR ANGIOGRAM SELECTIVE EACH ADDITIONAL VESSEL  02/15/2021  ? IR ANGIOGRAM VISCERAL SELECTIVE  07/25/2019  ? IR ANGIOGRAM VISCERAL SELECTIVE  07/25/2019  ? IR ANGIOGRAM VISCERAL SELECTIVE  01/02/2021  ? IR ANGIOGRAM VISCERAL SELECTIVE  01/02/2021   ? IR ANGIOGRAM VISCERAL SELECTIVE  01/18/2021  ? IR ANGIOGRAM VISCERAL SELECTIVE  02/15/2021  ? IR EMBO ARTERIAL NOT HEMORR HEMANG INC GUIDE ROADMAPPING  01/02/2021  ? IR EMBO TUMOR ORGAN ISCHEMIA INFARCT INC GUIDE ROADMAPPING  07/25/2019  ? IR EMBO TUMOR ORGAN ISCHEMIA INFARCT INC GUIDE ROADMAPPING  01/18/2021  ? IR EMBO TUMOR ORGAN ISCHEMIA INFARCT INC GUIDE ROADMAPPING  02/15/2021  ? IR RADIOLOGIST EVAL & MGMT  06/09/2019  ? IR RADIOLOGIST EVAL & MGMT  07/07/2019  ? IR RADIOLOGIST EVAL & MGMT  01/05/2020  ? IR RADIOLOGIST EVAL & MGMT  04/12/2020  ? IR RADIOLOGIST EVAL & MGMT  09/27/2020  ? IR RADIOLOGIST EVAL & MGMT  10/31/2020  ? IR RADIOLOGIST EVAL & MGMT  12/12/2020  ? IR RADIOLOGIST EVAL & MGMT  04/04/2021  ? IR RADIOLOGIST EVAL & MGMT  05/23/2021  ? IR US GUIDE VASC ACCESS RIGHT  07/25/2019  ? IR US GUIDE VASC ACCESS RIGHT  01/02/2021  ? IR US GUIDE VASC ACCESS RIGHT  01/18/2021  ? IR US GUIDE VASC ACCESS RIGHT  02/15/2021  ? PARTIAL COLECTOMY N/A 10/10/2016  ? Procedure: PARTIAL COLECTOMY;  Surgeon: Aviva Signs, MD;  [facility];  Service: General;  Laterality: N/A;  ? planter warts Bilateral   ? both feet  ? POLYPECTOMY  09/23/2016  ? Procedure: POLYPECTOMY;  Surgeon: Danie Binder, MD;  [facility];  Service: Endoscopy;;  transverse colon polyps times 2, rectal polyp  ? skin grafts    ? due to planter warts  ? TOTAL KNEE ARTHROPLASTY Right 05/30/2015  ? Procedure: RIGHT TOTAL KNEE ARTHROPLASTY;  Surgeon: Ninetta Lights, MD;  Location: [facility];  Service: Orthopedics;  Laterality: Right;  ? ? ?Allergies: ?Xeloda [capecitabine], Adhesive [tape], Neosporin original [bacitracin-neomycin-polymyxin], Sulfa antibiotics, and Erythromycin ? ?Medications: ?Prior to Admission medications   ?Medication Sig Start Date End Date Taking? Authorizing Provider  ?acetaminophen (TYLENOL) 500 MG tablet Take 500 mg by mouth every 6 (six) hours as needed (for pain.).    [provider]  ?APPLE CIDER VINEGAR PO Take 450 mg by  mouth daily.    [provider]  ?Calcium Carbonate-Vitamin D3 600-400 MG-UNIT TABS Take 1 tablet by mouth daily.    [provider]  ?carvedilol (COREG) 6.25 MG tablet Take 6.25 mg by mouth 2 (two) times daily with a meal.    [provider]  ?cephALEXin (KEFLEX) 500 MG capsule Take 1 capsule (500 mg total) by mouth 4 (four) times daily. 09/13/21   Melynda Ripple, MD  ?folic acid (FOLVITE) 1 MG tablet Take 1 mg by mouth daily.    [provider]  ?furosemide (LASIX) 40 MG tablet Take 20 mg by mouth daily as needed for edema. 07/24/19   [provider]  ?gabapentin (NEURONTIN) 600 MG tablet Take 600 mg by mouth daily. 09/03/21   [provider]  ?Glucagon (GVOKE HYPOPEN 2-PACK) 1 MG/0.2ML SOAJ Inject 0.2 mLs into the skin as needed (Blood glucose less than 55). 11/08/21   Lindell Spar, MD  ?glucosamine-chondroitin 500-400 MG tablet Take 1 tablet by mouth 3 (three) times daily.    [provider]  ?insulin degludec (TRESIBA FLEXTOUCH) 100 UNIT/ML FlexTouch Pen  Inject 40 Units into the skin at bedtime. 11/08/21   Lindell Spar, MD  ?loperamide (IMODIUM) 2 MG capsule Take 2 mg by mouth daily.    [provider]  ?losartan (COZAAR) 25 MG tablet Take 25 mg by mouth daily.    [provider]  ?magnesium gluconate (MAGONATE) 500 MG tablet Take 500 mg by mouth daily as needed (cramps).    [provider]  ?methotrexate (RHEUMATREX) 2.5 MG tablet Take 20 mg by mouth every Sunday. Caution:Chemotherapy. Protect from light.    [provider]  ?Multiple Vitamins-Minerals (CENTRUM SILVER PO) Take 1 tablet by mouth daily.    [provider]  ?NOVOLOG MIX 70/30 FLEXPEN (70-30) 100 UNIT/ML FlexPen Inject 20-30 Units into the skin See admin instructions. Take 30 units in the am and Take 20 units in the evening 09/14/16   [provider]  ?omeprazole (PRILOSEC) 40 MG capsule Take 40 mg by mouth daily.  11/17/16    [provider]  ?rivaroxaban (XARELTO) 20 MG TABS tablet Take 1 tablet (20 mg total) by mouth daily with supper. 11/21/20   Derek Jack, MD  ?rOPINIRole (REQUIP) 1 MG tablet Take 1 mg by m

## 2021-12-19 ENCOUNTER — Inpatient Hospital Stay (HOSPITAL_COMMUNITY): Payer: Medicare Other

## 2021-12-19 ENCOUNTER — Inpatient Hospital Stay (HOSPITAL_COMMUNITY): Payer: Medicare Other | Attending: Hematology | Admitting: Hematology

## 2021-12-19 VITALS — BP 163/82 | HR 93 | Temp 99.8°F | Resp 20 | Ht 64.96 in | Wt 214.0 lb

## 2021-12-19 DIAGNOSIS — Z79899 Other long term (current) drug therapy: Secondary | ICD-10-CM | POA: Insufficient documentation

## 2021-12-19 DIAGNOSIS — R197 Diarrhea, unspecified: Secondary | ICD-10-CM | POA: Insufficient documentation

## 2021-12-19 DIAGNOSIS — Z5112 Encounter for antineoplastic immunotherapy: Secondary | ICD-10-CM | POA: Insufficient documentation

## 2021-12-19 DIAGNOSIS — C20 Malignant neoplasm of rectum: Secondary | ICD-10-CM | POA: Diagnosis not present

## 2021-12-19 DIAGNOSIS — C220 Liver cell carcinoma: Secondary | ICD-10-CM

## 2021-12-19 DIAGNOSIS — C182 Malignant neoplasm of ascending colon: Secondary | ICD-10-CM | POA: Insufficient documentation

## 2021-12-19 LAB — COMPREHENSIVE METABOLIC PANEL
ALT: 26 U/L (ref 0–44)
AST: 36 U/L (ref 15–41)
Albumin: 2.6 g/dL — ABNORMAL LOW (ref 3.5–5.0)
Alkaline Phosphatase: 81 U/L (ref 38–126)
Anion gap: 4 — ABNORMAL LOW (ref 5–15)
BUN: 16 mg/dL (ref 8–23)
CO2: 26 mmol/L (ref 22–32)
Calcium: 8.7 mg/dL — ABNORMAL LOW (ref 8.9–10.3)
Chloride: 106 mmol/L (ref 98–111)
Creatinine, Ser: 0.91 mg/dL (ref 0.44–1.00)
GFR, Estimated: >60 mL/min (ref >60)
Glucose, Bld: 146 mg/dL — ABNORMAL HIGH (ref 70–99)
Potassium: 4.1 mmol/L (ref 3.5–5.1)
Sodium: 136 mmol/L (ref 135–145)
Total Bilirubin: 1.4 mg/dL — ABNORMAL HIGH (ref 0.3–1.2)
Total Protein: 6.4 g/dL — ABNORMAL LOW (ref 6.5–8.1)

## 2021-12-19 LAB — CBC WITH DIFFERENTIAL/PLATELET
Abs Immature Granulocytes: 0.03 10*3/uL (ref 0.00–0.07)
Basophils Absolute: 0 10*3/uL (ref 0.0–0.1)
Basophils Relative: 0 %
Eosinophils Absolute: 0.1 10*3/uL (ref 0.0–0.5)
Eosinophils Relative: 1 %
HCT: 32.1 % — ABNORMAL LOW (ref 36.0–46.0)
Hemoglobin: 10.1 g/dL — ABNORMAL LOW (ref 12.0–15.0)
Immature Granulocytes: 0 %
Lymphocytes Relative: 14 %
Lymphs Abs: 1.1 10*3/uL (ref 0.7–4.0)
MCH: 28.5 pg (ref 26.0–34.0)
MCHC: 31.5 g/dL (ref 30.0–36.0)
MCV: 90.7 fL (ref 80.0–100.0)
Monocytes Absolute: 0.4 10*3/uL (ref 0.1–1.0)
Monocytes Relative: 5 %
Neutro Abs: 6.6 10*3/uL (ref 1.7–7.7)
Neutrophils Relative %: 80 %
Platelets: 177 10*3/uL (ref 150–400)
RBC: 3.54 MIL/uL — ABNORMAL LOW (ref 3.87–5.11)
RDW: 13.9 % (ref 11.5–15.5)
WBC: 8.3 10*3/uL (ref 4.0–10.5)
nRBC: 0 % (ref 0.0–0.2)

## 2021-12-19 LAB — AMMONIA: Ammonia: 16 umol/L (ref 9–35)

## 2021-12-19 LAB — PROTIME-INR
INR: 1.2 (ref 0.8–1.2)
Prothrombin Time: 15.5 seconds — ABNORMAL HIGH (ref 11.4–15.2)

## 2021-12-19 NOTE — Progress Notes (Signed)
? ?[facility] ?[address] ?[place], [place] [address] ? ? ?CLINIC:  ?Medical Oncology/Hematology ? ?PCP:  ?Lindell Spar, MD ?[address] / [place] [address] ?[id number] ? ? ?REASON FOR VISIT:  ?Follow-up for right colon cancer, hepatocellular carcinoma and iron deficiency state ? ?PRIOR THERAPY:  ?1. Right hemicolectomy on 10/10/2016. ?2. Intermittent Feraheme last on 09/22/2018. ?3. Bland embolization of hepatic lesion on 07/25/2019. ? ?NGS Results: not done ? ?CURRENT THERAPY: surveillance ? ?BRIEF ONCOLOGIC HISTORY:  ?Oncology History  ?Colon cancer ([place])  ?10/10/2016 Initial Diagnosis  ? Colon cancer ([place]) ? ?  ?10/10/2016 Surgery  ? Partial colectomy by Dr. Arnoldo Morale  ?Pathology shows  a 2.1 cm grade 2 adenocarcinoma of the ascending colon with invasion through the muscularis propria into the peri-colorectal tissues.  ? ? ?  ?11/17/2016 PET scan  ? The hepatic lesion seen on the CT scan and MRI does not demonstrate ?hypermetabolism and is likely a benign entity. ?  ?Right maxillary sinus disease with associated hypermetabolism. ?  ?Scattered pulmonary nodules, likely benign. No follow-up needed if ?patient is low-risk (and has no known or suspected primary ?neoplasm). Non-contrast chest CT can be considered in 12 months if ?patient is high-risk.  ? ?  ?12/25/2016 - 02/04/2017 Adjuvant Chemotherapy  ? Xeloda '2000mg'$  PO BID take for 14 days out of 21 days. Plan for total of 24 weeks of treatment. ? ?  ? ? ?CANCER STAGING: ? Cancer Staging  ?Colon cancer ([place]) ?Staging form: Colon and Rectum, AJCC 8th Edition ?- Clinical: Stage IIA (cT3, cN0, cM0) - Signed by [name], MD on 11/26/2016 ? ? ?INTERVAL HISTORY:  ?Wanda Curry, a 76 y.o. female, returns for routine follow-up of her right colon cancer, hepatocellular carcinoma and iron deficiency state. Wanda Curry was last seen on 11/26/2021.  ? ?Today she reports feeling fair. She reports a fall on Easter Sunday, and she reports dizziness  upon standing which has been present for the past 10 years. She reports stable diarrhea and nausea. She denies vomiting blood and confusion.  ? ?REVIEW OF SYSTEMS:  ?Review of Systems  ?Constitutional:  Positive for fatigue. Negative for appetite change.  ?Gastrointestinal:  Positive for abdominal pain (5/10), diarrhea and nausea.  ?Neurological:  Positive for dizziness, headaches and numbness.  ?Psychiatric/Behavioral:  Positive for depression. Negative for confusion. The patient is nervous/anxious.   ?All other systems reviewed and are negative. ? ?PAST MEDICAL/SURGICAL HISTORY:  ?Past Medical History:  ?Diagnosis Date  ? Acute pulmonary embolism ([place]) 04/15/2017  ? Anemia   ? Arthritis   ? RA  ? Cancer [facility])   ? skin cancer, colon, liver  ? Depression   ? Diabetes mellitus without complication ([place])   ? Dyspnea   ? Fibromyalgia   ? GERD (gastroesophageal reflux disease)   ? GI bleed 08/12/2017  ? Heart murmur   ? ECHO scheduled 05-25-2015  ? Hyperlipidemia   ? Neuropathy   ? PONV (postoperative nausea and vomiting)   ? Puncture wound of great toe   ? Right leg DVT ([place]) 12/01/2020  ? ?Past Surgical History:  ?Procedure Laterality Date  ? ABDOMINAL HYSTERECTOMY    ? BIOPSY  09/23/2016  ? Procedure: BIOPSY;  Surgeon: Danie Binder, MD;  [facility];  Service: Endoscopy;;  hepatic flexure mass  ? CHOLECYSTECTOMY    ? COLONOSCOPY N/A 08/13/2017  ? Procedure: COLONOSCOPY;  Surgeon: Daneil Dolin, MD;  [facility];  Service:  Endoscopy;  Laterality: N/A;  ? COLONOSCOPY WITH PROPOFOL N/A 09/23/2016  ? Procedure: COLONOSCOPY WITH PROPOFOL;  Surgeon: Danie Binder, MD;  [facility];  Service: Endoscopy;  Laterality: N/A;  7:30 am  ? HARDWARE REMOVAL Right 05/30/2015  ? Procedure: HARDWARE REMOVAL;  Surgeon: Ninetta Lights, MD;  Location: [facility];  Service: Orthopedics;  Laterality: Right;  ? IR ANGIOGRAM SELECTIVE EACH ADDITIONAL VESSEL  07/25/2019  ? IR ANGIOGRAM SELECTIVE EACH ADDITIONAL  VESSEL  07/25/2019  ? IR ANGIOGRAM SELECTIVE EACH ADDITIONAL VESSEL  01/02/2021  ? IR ANGIOGRAM SELECTIVE EACH ADDITIONAL VESSEL  01/02/2021  ? IR ANGIOGRAM SELECTIVE EACH ADDITIONAL VESSEL  01/02/2021  ? IR ANGIOGRAM SELECTIVE EACH ADDITIONAL VESSEL  01/18/2021  ? IR ANGIOGRAM SELECTIVE EACH ADDITIONAL VESSEL  02/15/2021  ? IR ANGIOGRAM SELECTIVE EACH ADDITIONAL VESSEL  02/15/2021  ? IR ANGIOGRAM VISCERAL SELECTIVE  07/25/2019  ? IR ANGIOGRAM VISCERAL SELECTIVE  07/25/2019  ? IR ANGIOGRAM VISCERAL SELECTIVE  01/02/2021  ? IR ANGIOGRAM VISCERAL SELECTIVE  01/02/2021  ? IR ANGIOGRAM VISCERAL SELECTIVE  01/18/2021  ? IR ANGIOGRAM VISCERAL SELECTIVE  02/15/2021  ? IR EMBO ARTERIAL NOT HEMORR HEMANG INC GUIDE ROADMAPPING  01/02/2021  ? IR EMBO TUMOR ORGAN ISCHEMIA INFARCT INC GUIDE ROADMAPPING  07/25/2019  ? IR EMBO TUMOR ORGAN ISCHEMIA INFARCT INC GUIDE ROADMAPPING  01/18/2021  ? IR EMBO TUMOR ORGAN ISCHEMIA INFARCT INC GUIDE ROADMAPPING  02/15/2021  ? IR RADIOLOGIST EVAL & MGMT  06/09/2019  ? IR RADIOLOGIST EVAL & MGMT  07/07/2019  ? IR RADIOLOGIST EVAL & MGMT  01/05/2020  ? IR RADIOLOGIST EVAL & MGMT  04/12/2020  ? IR RADIOLOGIST EVAL & MGMT  09/27/2020  ? IR RADIOLOGIST EVAL & MGMT  10/31/2020  ? IR RADIOLOGIST EVAL & MGMT  12/12/2020  ? IR RADIOLOGIST EVAL & MGMT  04/04/2021  ? IR RADIOLOGIST EVAL & MGMT  05/23/2021  ? IR RADIOLOGIST EVAL & MGMT  12/17/2021  ? IR US GUIDE VASC ACCESS RIGHT  07/25/2019  ? IR US GUIDE VASC ACCESS RIGHT  01/02/2021  ? IR US GUIDE VASC ACCESS RIGHT  01/18/2021  ? IR US GUIDE VASC ACCESS RIGHT  02/15/2021  ? PARTIAL COLECTOMY N/A 10/10/2016  ? Procedure: PARTIAL COLECTOMY;  Surgeon: Aviva Signs, MD;  [facility];  Service: General;  Laterality: N/A;  ? planter warts Bilateral   ? both feet  ? POLYPECTOMY  09/23/2016  ? Procedure: POLYPECTOMY;  Surgeon: Danie Binder, MD;  [facility];  Service: Endoscopy;;  transverse colon polyps times 2, rectal polyp  ? skin grafts    ? due to planter warts  ? TOTAL KNEE  ARTHROPLASTY Right 05/30/2015  ? Procedure: RIGHT TOTAL KNEE ARTHROPLASTY;  Surgeon: Ninetta Lights, MD;  Location: [facility];  Service: Orthopedics;  Laterality: Right;  ? ? ?SOCIAL HISTORY:  ?Social History  ? ?Socioeconomic History  ? Marital status: Widowed  ?  Spouse name: Not on file  ? Number of children: Not on file  ? Years of education: Not on file  ? Highest education level: Not on file  ?Occupational History  ? Not on file  ?Tobacco Use  ? Smoking status: Former  ?  Packs/day: 1.00  ?  Years: 40.00  ?  Pack years: 40.00  ?  Types: Cigarettes  ?  Quit date: 08/18/2004  ?  Years since quitting: 17.3  ? Smokeless tobacco: Never  ?Vaping Use  ? Vaping Use: Never used  ?Substance  and Sexual Activity  ? Alcohol use: No  ? Drug use: No  ? Sexual activity: Not Currently  ?  Birth control/protection: Surgical  ?Other Topics Concern  ? Not on file  ?Social History Narrative  ? Not on file  ? ?Social Determinants of Health  ? ?Financial Resource Strain: Not on file  ?Food Insecurity: Not on file  ?Transportation Needs: Not on file  ?Physical Activity: Not on file  ?Stress: Not on file  ?Social Connections: Not on file  ?Intimate Partner Violence: Not on file  ? ? ?FAMILY HISTORY:  ?Family History  ?Problem Relation Age of Onset  ? Breast cancer Mother   ? Breast cancer Daughter   ? Colon cancer Neg Hx   ? ? ?CURRENT MEDICATIONS:  ?Current Outpatient Medications  ?Medication Sig Dispense Refill  ? acetaminophen (TYLENOL) 500 MG tablet Take 500 mg by mouth every 6 (six) hours as needed (for pain.).    ? APPLE CIDER VINEGAR PO Take 450 mg by mouth daily.    ? Calcium Carbonate-Vitamin D3 600-400 MG-UNIT TABS Take 1 tablet by mouth daily.    ? carvedilol (COREG) 6.25 MG tablet Take 6.25 mg by mouth 2 (two) times daily with a meal.    ? cephALEXin (KEFLEX) 500 MG capsule Take 1 capsule (500 mg total) by mouth 4 (four) times daily. 20 capsule 0  ? folic acid (FOLVITE) 1 MG tablet Take 1 mg by mouth daily.    ? furosemide  (LASIX) 40 MG tablet Take 20 mg by mouth daily as needed for edema.    ? gabapentin (NEURONTIN) 600 MG tablet Take 600 mg by mouth daily.    ? Glucagon (GVOKE HYPOPEN 2-PACK) 1 MG/0.2ML SOAJ Inject 0.2 mLs in

## 2021-12-19 NOTE — Progress Notes (Signed)
START OFF PATHWAY REGIMEN - Other   [id number]:Atezolizumab 1,200 mg IV D1 + Bevacizumab 15 mg/kg IV D1 q21 Days:   A cycle is every 21 Days:     Atezolizumab      Bevacizumab-xxxx   **Always confirm dose/schedule in your pharmacy ordering system**  Patient Characteristics: Intent of Therapy: Non-Curative / Palliative Intent, Discussed with Patient 

## 2021-12-19 NOTE — Patient Instructions (Addendum)
[facility] ?Chemotherapy Teaching ? ? 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 You will be treated here in the clinic every 3 weeks with a combination of two drugs known as Bevacizumab (Avastin) and Atezolizumab (Tecentriq). The intent of treatment is to control the cancer, shrink it and prevent it from spreading further. This treatment will also help to manage symptoms you may be having related to the cancer. You will see the doctor regularly throughout treatment.  We will obtain blood work from you prior to every treatment and monitor your results to make sure it is safe to give your treatment. The doctor monitors your response to treatment by the way you are feeling, your blood work, and by obtaining scans periodically. There will be wait times while you are here for treatment.  It will take about 30 minutes to 1 hour for your lab work to result.  Then there will be wait times while pharmacy mixes your medications.  ? ? ?Bevacizumab (Avastin?) ? ?About This Drug ?Bevacizumab is used to treat cancer. It is given in the vein (IV). It will take 30 minutes to infuse.  ? ?Possible Side Effects ? Teary eyes ? ? Runny/stuffy nose ? ? Nosebleed ? ? Changes in the way food and drinks taste ? ? Headache ? ? Back pain ? ? Protein in your urine ? ? Bleeding in your rectum ? ? Dry skin ? ? A red skin rash which can be peeling or scaling ? ? High blood pressure ? ?Note: Each of the side effects above was reported in 10% or greater of patients treated with bevacizumab-xxxx. Not all possible side effects are included above. ? ?Warnings and Precautions ? ? Perforation or fistula- an abnormal hole in your stomach, intestine, esophagus, or other organ, which can be life-threatening ? Slow wound healing, which can be life-threatening ? Abnormal bleeding which can be life-threatening - symptoms may be coughing up blood, throwing up blood (may look like coffee grounds), red or black tarry bowel  movements, abnormally heavy menstrual flow, nosebleeds or any other unusual bleeding. ? Blood clots and events such as stroke and heart attack. A blood clot in your leg may cause your leg to swell, appear red and warm, and/or cause pain. A blood clot in your lungs may cause trouble breathing, pain when breathing, and/or chest pain. ? Severe high blood pressure ? Changes in your central nervous system can happen. The central nervous system is made up of your brain and spinal cord. You could feel extreme tiredness, agitation, confusion, hallucinations  ? This drug may interact with other medicines. Tell your doctor and pharmacist about all the medicines and dietary supplements (vitamins, minerals, herbs and others) that you are taking at this time. Also, check with your doctor or pharmacist before starting any new prescription or over-the-counter medicines, or dietary supplements to make sure that there are no interactions. ? ?When to Call the Doctor ?Call your doctor or nurse if you have any of these symptoms and/or any new or unusual symptoms: ? ? Fever of 100.4? F (38? C) or higher ? ? Chills ? ? Confusion and/or agitation ? ? Hallucinations ? ? Trouble understanding or speaking ? ? Headache that does not go away ? ? Nose bleed that doesn?t stop bleeding after 10 -15 minutes ? ? Feeling dizzy or lightheaded ? ? Blurry vision or changes in your eyesight ? ? Difficulty swallowing ? ? Easy bleeding or bruising ? ? Blood in your  urine, vomit (bright red or coffee-ground) and/or stools ( bright red, or black/tarry) ? ? Coughing up blood ? ? Wheezing and/or trouble breathing ? ? Chest pain or symptoms of a heart attack. Most heart attacks involve pain in the center of the chest that lasts more than a few minutes. The pain may go away and come back. It can feel like pressure, squeezing, fullness, or pain. Sometimes pain is felt in one or both arms, the back, neck, jaw, or stomach. If any of these symptoms last 2 minutes,  call 911. ? ? Symptoms of a stroke such as sudden numbness or weakness of your face, arm, or leg, mostly on one side of your body; sudden confusion, trouble speaking or understanding; sudden trouble seeing in one or both eyes; sudden trouble walking, feeling dizzy, loss of balance or coordination; or sudden, bad headache with no known cause. If you have any of these symptoms for 2 minutes, call 911. ? ? Numbness or lack of strength to your arms, legs, face, or body ? ? Nausea that stops you from eating or drinking and/or relieved by prescribed medicine ? ? Throwing up ? ? Pain in your abdomen that does not go away ? ? Foamy or bubbly-looking urine ? ? Signs of infusion reaction: fever or shaking chills, flushing, facial swelling, feeling dizzy, headache, trouble breathing, rash, itching, chest tightness, or chest pain. ? ? Pain that does not go away or is not relieved by prescribed medicine ? ? Your leg or arm is swollen, red, warm and/or painful ? ? Swelling of arms, hands, legs and/or feet ? ? Weight gain of 5 pounds in one week (fluid retention) ? ? If you think you may be pregnant ? ?Reproduction Warnings ? Pregnancy warning: This drug can have harmful effects on the unborn baby. Women of child bearing potential should use effective methods of birth control during your cancer treatment and for 6 months after treatment. In women, changes in your ovaries may happen that may cause menstrual bleeding to become irregular or stop, do not assume you cannot get pregnant. Let your doctor know right away if you think you may be pregnant ? ? Breastfeeding warning: Women should not breastfeed during treatment and for 6 months after treatment because this drug could enter the breast milk and cause harm to a breastfeeding baby.  ? ? Fertility warning: In women, this drug may affect your ability to have children in the future. Talk with your doctor or nurse if you plan to have children. Ask for information on egg  banking. ? ? ?Atezolizumab (Tecentriq?) ? ?About This Drug ?Atezolizumab is used to treat cancer. It is given by the vein (IV). The first infusion takes one hour. All subsequent infusions will be given over 30 minutes.  ? ?Possible Side Effects ? Nausea ? Tiredness and weakness ? Decreased appetite (decreased hunger) ? Cough ? Trouble breathing ? ?Note: Each of the side effects above was reported in 20% or greater of patients treated with atezolizumab. Your side effects may be different if you are taking atezolizumab in combination with another agent. Not all possible side effects are included above. ? ?Warnings and Precautions ? This drug works with your immune system and can cause inflammation (swelling) in any of your organs and tissues and can change how they work. This may put you at risk for developing serious medical problems, which can be life-threatening. ? ? Inflammation of the lungs which can be life-threatening. You may have a  dry cough or trouble breathing. ? ? Severe changes in your liver function which can cause liver failure and be life-threatening. ? ? Colitis which is swelling in the colon. The symptoms are diarrhea, stomach cramping, and sometimes blood in the bowel movements. ? ? Changes in your central nervous system can happen. The central nervous system is made up of your brain and spinal cord. You could feel extreme tiredness, agitation, confusion, hallucinations (see or hear things that are not there), trouble understanding or speaking, loss of control of your bowels or bladder, eyesight changes, numbness or lack of strength to your arms, legs, face, or body, and coma. If you start to have any of these symptoms let your doctor know right away. ? ? This drug may affect your hormone glands (thyroid, adrenals, pituitary and pancreas). ? ? Blood sugar levels may change, and you may develop diabetes. If you already have diabetes, changes may need to be made to your diabetes medication. ? ? Severe  infections, including viral, bacterial and fungal, which can be life-threatening ? ? While you are getting this drug in your vein (IV), you may have a reaction to the drug. Sometimes you may be given medication to

## 2021-12-19 NOTE — Patient Instructions (Addendum)
[place] at [facility] ?Discharge Instructions ? ? ?You were seen and examined today by Dr. Delton Coombes. ? ?He reviewed the results of your MRIs of the brain and liver.  ? ?He also discussed with you initiating treatment with an antibody called Avastin and an immunotherapy drug called Tecentriq. ? ?We will check lab work today.  ? ? ? ? ?Thank you for choosing [place] at [facility] to provide your oncology and hematology care.  To afford each patient quality time with our provider, please arrive at least 15 minutes before your scheduled appointment time.  ? ?If you have a lab appointment with the [facility] please come in thru the Main Entrance and check in at the main information desk. ? ?You need to re-schedule your appointment should you arrive 10 or more minutes late.  We strive to give you quality time with our providers, and arriving late affects you and other patients whose appointments are after yours.  Also, if you no show three or more times for appointments you may be dismissed from the clinic at the providers discretion.     ?Again, thank you for choosing [facility].  Our hope is that these requests will decrease the amount of time that you wait before being seen by our physicians.       ?_____________________________________________________________ ? ?Should you have questions after your visit to [facility], please contact our office at [phone] and follow the prompts.  Our office hours are 8:00 a.m. and 4:30 p.m. Monday - Friday.  Please note that voicemails left after 4:00 p.m. may not be returned until the following business day.  We are closed weekends and major holidays.  You do have access to a nurse 24-7, just call the main number to the clinic [phone] and do not press any options, hold on the line and a nurse will answer the phone.   ? ?For prescription refill requests, have your pharmacy contact our  office and allow 72 hours.   ? ?Due to Covid, you will need to wear a mask upon entering the hospital. If you do not have a mask, a mask will be given to you at the Main Entrance upon arrival. For doctor visits, patients may have 1 support person age 59 or older with them. For treatment visits, patients can not have anyone with them due to social distancing guidelines and our immunocompromised population.  ? ?   ?

## 2021-12-21 LAB — AFP TUMOR MARKER: AFP, Serum, Tumor Marker: 1980 ng/mL — ABNORMAL HIGH (ref 0.0–9.2)

## 2021-12-25 ENCOUNTER — Ambulatory Visit: Payer: Medicare Other | Admitting: Gastroenterology

## 2021-12-25 ENCOUNTER — Inpatient Hospital Stay (HOSPITAL_COMMUNITY): Payer: Medicare Other | Admitting: Licensed Clinical Social Worker

## 2021-12-25 DIAGNOSIS — C220 Liver cell carcinoma: Secondary | ICD-10-CM

## 2021-12-25 NOTE — Progress Notes (Signed)
[facility] CSW Progress Note ? ?Clinical Social Worker  attempted to contact pt by phone  to assess.  Voicemail left for pt.  CSW to continue to attempt to contact pt.  Assessment to be completed once pt has been contacted. ? ? ? ?[place] Work, LCSW ?

## 2021-12-26 ENCOUNTER — Inpatient Hospital Stay (HOSPITAL_COMMUNITY): Payer: Medicare Other

## 2021-12-26 ENCOUNTER — Encounter (HOSPITAL_COMMUNITY): Payer: Self-pay

## 2021-12-26 VITALS — BP 110/68 | HR 74 | Temp 97.5°F | Resp 18 | Wt 216.4 lb

## 2021-12-26 DIAGNOSIS — C220 Liver cell carcinoma: Secondary | ICD-10-CM

## 2021-12-26 DIAGNOSIS — Z5112 Encounter for antineoplastic immunotherapy: Secondary | ICD-10-CM | POA: Diagnosis not present

## 2021-12-26 LAB — CBC WITH DIFFERENTIAL/PLATELET
Abs Immature Granulocytes: 0.02 10*3/uL (ref 0.00–0.07)
Basophils Absolute: 0 10*3/uL (ref 0.0–0.1)
Basophils Relative: 0 %
Eosinophils Absolute: 0.2 10*3/uL (ref 0.0–0.5)
Eosinophils Relative: 3 %
HCT: 30 % — ABNORMAL LOW (ref 36.0–46.0)
Hemoglobin: 9.6 g/dL — ABNORMAL LOW (ref 12.0–15.0)
Immature Granulocytes: 0 %
Lymphocytes Relative: 14 %
Lymphs Abs: 0.8 10*3/uL (ref 0.7–4.0)
MCH: 28.9 pg (ref 26.0–34.0)
MCHC: 32 g/dL (ref 30.0–36.0)
MCV: 90.4 fL (ref 80.0–100.0)
Monocytes Absolute: 0.3 10*3/uL (ref 0.1–1.0)
Monocytes Relative: 6 %
Neutro Abs: 4.4 10*3/uL (ref 1.7–7.7)
Neutrophils Relative %: 77 %
Platelets: 174 10*3/uL (ref 150–400)
RBC: 3.32 MIL/uL — ABNORMAL LOW (ref 3.87–5.11)
RDW: 14.3 % (ref 11.5–15.5)
WBC: 5.8 10*3/uL (ref 4.0–10.5)
nRBC: 0 % (ref 0.0–0.2)

## 2021-12-26 LAB — COMPREHENSIVE METABOLIC PANEL
ALT: 20 U/L (ref 0–44)
AST: 25 U/L (ref 15–41)
Albumin: 2.3 g/dL — ABNORMAL LOW (ref 3.5–5.0)
Alkaline Phosphatase: 74 U/L (ref 38–126)
Anion gap: 5 (ref 5–15)
BUN: 15 mg/dL (ref 8–23)
CO2: 26 mmol/L (ref 22–32)
Calcium: 8.3 mg/dL — ABNORMAL LOW (ref 8.9–10.3)
Chloride: 104 mmol/L (ref 98–111)
Creatinine, Ser: 0.94 mg/dL (ref 0.44–1.00)
GFR, Estimated: >60 mL/min (ref >60)
Glucose, Bld: 207 mg/dL — ABNORMAL HIGH (ref 70–99)
Potassium: 3.6 mmol/L (ref 3.5–5.1)
Sodium: 135 mmol/L (ref 135–145)
Total Bilirubin: 1.1 mg/dL (ref 0.3–1.2)
Total Protein: 5.9 g/dL — ABNORMAL LOW (ref 6.5–8.1)

## 2021-12-26 LAB — TSH: TSH: 0.092 u[IU]/mL — ABNORMAL LOW (ref 0.350–4.500)

## 2021-12-26 LAB — MAGNESIUM: Magnesium: 1.3 mg/dL — ABNORMAL LOW (ref 1.7–2.4)

## 2021-12-26 MED ORDER — PROCHLORPERAZINE MALEATE 10 MG PO TABS: 10.0000 mg | ORAL_TABLET | Freq: Four times a day (QID) | ORAL | 1 refills | Status: DC | PRN

## 2021-12-26 MED ORDER — SODIUM CHLORIDE 0.9 % IV SOLN: Freq: Once | INTRAVENOUS | Status: AC

## 2021-12-26 MED ORDER — LIDOCAINE-PRILOCAINE 2.5-2.5 % EX CREA: TOPICAL_CREAM | CUTANEOUS | 3 refills | Status: DC

## 2021-12-26 MED ORDER — PROCHLORPERAZINE MALEATE 10 MG PO TABS: 10.0000 mg | ORAL_TABLET | Freq: Four times a day (QID) | ORAL | 3 refills | Status: DC | PRN

## 2021-12-26 MED ORDER — SODIUM CHLORIDE 0.9 % IV SOLN
1200.0000 mg | Freq: Once | INTRAVENOUS | Status: AC
  Administered 2021-12-26: 1200 mg via INTRAVENOUS
  Filled 2021-12-26: qty 20

## 2021-12-26 NOTE — Progress Notes (Signed)
Spoke with Dayton Scrape, NP regarding TSH. No further action taken. Redraw scheduled at next treatment day. ?

## 2021-12-26 NOTE — Patient Instructions (Signed)
[facility]  Discharge Instructions: Thank you for choosing [facility] to provide your oncology and hematology care.  If you have a lab appointment with the Cancer Center, please come in thru the Main Entrance and check in at the main information desk.  Wear comfortable clothing and clothing appropriate for easy access to any Portacath or PICC line.   We strive to give you quality time with your provider. You may need to reschedule your appointment if you arrive late (15 or more minutes).  Arriving late affects you and other patients whose appointments are after yours.  Also, if you miss three or more appointments without notifying the office, you may be dismissed from the clinic at the provider's discretion.      For prescription refill requests, have your pharmacy contact our office and allow 72 hours for refills to be completed.        To help prevent nausea and vomiting after your treatment, we encourage you to take your nausea medication as directed.  BELOW ARE SYMPTOMS THAT SHOULD BE REPORTED IMMEDIATELY: *FEVER GREATER THAN 100.4 F (38 C) OR HIGHER *CHILLS OR SWEATING *NAUSEA AND VOMITING THAT IS NOT CONTROLLED WITH YOUR NAUSEA MEDICATION *UNUSUAL SHORTNESS OF BREATH *UNUSUAL BRUISING OR BLEEDING *URINARY PROBLEMS (pain or burning when urinating, or frequent urination) *BOWEL PROBLEMS (unusual diarrhea, constipation, pain near the anus) TENDERNESS IN MOUTH AND THROAT WITH OR WITHOUT PRESENCE OF ULCERS (sore throat, sores in mouth, or a toothache) UNUSUAL RASH, SWELLING OR PAIN  UNUSUAL VAGINAL DISCHARGE OR ITCHING   Items with * indicate a potential emergency and should be followed up as soon as possible or go to the Emergency Department if any problems should occur.  Please show the CHEMOTHERAPY ALERT CARD or IMMUNOTHERAPY ALERT CARD at check-in to the Emergency Department and triage nurse.  Should you have questions after your visit or need to cancel  or reschedule your appointment, please contact [facility] [phone]  and follow the prompts.  Office hours are 8:00 a.m. to 4:30 p.m. Monday - Friday. Please note that voicemails left after 4:00 p.m. may not be returned until the following business day.  We are closed weekends and major holidays. You have access to a nurse at all times for urgent questions. Please call the main number to the clinic [phone] and follow the prompts.  For any non-urgent questions, you may also contact your provider using MyChart. We now offer e-Visits for anyone 18 and older to request care online for non-urgent symptoms. For details visit [email or web address].   Also download the MyChart app! Go to the app store, search "MyChart", open the app, select [facility], and log in with your MyChart username and password.  Due to Covid, a mask is required upon entering the hospital/clinic. If you do not have a mask, one will be given to you upon arrival. For doctor visits, patients may have 1 support person aged 18 or older with them. For treatment visits, patients cannot have anyone with them due to current Covid guidelines and our immunocompromised population.  

## 2021-12-26 NOTE — Progress Notes (Signed)
Pharmacist Chemotherapy Monitoring - Initial Assessment   ? ?Anticipated start date: 12/26/21  ? ?The following has been reviewed per standard work regarding the patient's treatment regimen: ?The patient's diagnosis, treatment plan and drug doses, and organ/hematologic function ?Lab orders and baseline tests specific to treatment regimen  ?The treatment plan start date, drug sequencing, and pre-medications ?Prior authorization status  ?Patient's documented medication list, including drug-drug interaction screen and prescriptions for anti-emetics and supportive care specific to the treatment regimen ?The drug concentrations, fluid compatibility, administration routes, and timing of the medications to be used ?The patient's access for treatment and lifetime cumulative dose history, if applicable  ?The patient's medication allergies and previous infusion related reactions, if applicable  ? ?Changes made to treatment plan:  ?N/A ? ?Follow up needed:  ?N/A ? ? ?Wynona Neat, [facility], ?12/26/2021  9:39 AM ? ?

## 2021-12-26 NOTE — Progress Notes (Signed)
Patient presents today for Tecentriq infusion.  Patient is in satisfactory condition with only complaints of chronic knee pain.  Patient receives steroid injections for this.  Vital signs are stable.  Labs reviewed. Magnesium today is 1.3. Patient states she only takes PO Magesium at home as needed when she has leg cramps.  Dr. Alvy Bimler notified.  Per MD, start taking PO Magnesium daily and proceed with treatment.  We will proceed per MD orders.  ? ?Patient tolerated treatment well with no complaints voiced.  Patient left via wheelchair with daughter in stable condition.  Vital signs stable at discharge.  Follow up as scheduled.    ?

## 2021-12-26 NOTE — Progress Notes (Signed)
Chemotherapy and immunotherapy education packet given and discussed with pt and family in detail. Discussed diagnosis and staging, tx regimen, and intent of tx. Reviewed chemotherapy and immunotherapy medications and side effects, as well as pre-medications. Instructed on how to manage side effects at home, and when to call the clinic. Importance of fever/chills discussed with pt and family. Discussed precautions to implement at home after receiving tx, as well as self care strategies. Phone numbers provided for clinic during regular working hours, also how to reach the clinic after hours and on weekends. Pt and family provided the opportunity to ask questions - all questions answered to pt's and family satisfaction.   

## 2021-12-30 ENCOUNTER — Encounter (HOSPITAL_COMMUNITY): Payer: Self-pay

## 2021-12-30 NOTE — Progress Notes (Signed)
PA for Lidocaine-Prilocaine cream approved through [facility] from 08/18/21-03/30/22.   ?

## 2022-01-02 ENCOUNTER — Ambulatory Visit: Payer: Medicare Other | Admitting: General Surgery

## 2022-01-02 ENCOUNTER — Encounter: Payer: Self-pay | Admitting: General Surgery

## 2022-01-02 VITALS — BP 147/75 | HR 96 | Temp 98.1°F | Resp 16 | Ht 64.0 in | Wt 215.0 lb

## 2022-01-02 DIAGNOSIS — C220 Liver cell carcinoma: Secondary | ICD-10-CM

## 2022-01-02 NOTE — H&P (Signed)
Wanda Curry; [id number]; 1946/03/12   HPI Patient is a 76 year old white female who was referred to my care by Dr. Delton Coombes of oncology for Port-A-Cath placement.  She is undergoing chemotherapy for hepatocellular carcinoma.  She is on Xarelto for history of a right leg DVT. Past Medical History:  Diagnosis Date   Acute pulmonary embolism ([place]) 04/15/2017   Anemia    Arthritis    RA   Cancer (HCC)    skin cancer, colon, liver   Depression    Diabetes mellitus without complication (HCC)    Dyspnea    Fibromyalgia    GERD (gastroesophageal reflux disease)    GI bleed 08/12/2017   Heart murmur    ECHO scheduled 05-25-2015   Hyperlipidemia    Neuropathy    PONV (postoperative nausea and vomiting)    Puncture wound of great toe    Right leg DVT ([place]) 12/01/2020    Past Surgical History:  Procedure Laterality Date   ABDOMINAL HYSTERECTOMY     BIOPSY  09/23/2016   Procedure: BIOPSY;  Surgeon: Danie Binder, MD;  [facility];  Service: Endoscopy;;  hepatic flexure mass   CHOLECYSTECTOMY     COLONOSCOPY N/A 08/13/2017   Procedure: COLONOSCOPY;  Surgeon: Daneil Dolin, MD;  [facility];  Service: Endoscopy;  Laterality: N/A;   COLONOSCOPY WITH PROPOFOL N/A 09/23/2016   Procedure: COLONOSCOPY WITH PROPOFOL;  Surgeon: Danie Binder, MD;  [facility];  Service: Endoscopy;  Laterality: N/A;  7:30 am   HARDWARE REMOVAL Right 05/30/2015   Procedure: HARDWARE REMOVAL;  Surgeon: Ninetta Lights, MD;  Location: [place];  Service: Orthopedics;  Laterality: Right;   IR ANGIOGRAM SELECTIVE EACH ADDITIONAL VESSEL  07/25/2019   IR ANGIOGRAM SELECTIVE EACH ADDITIONAL VESSEL  07/25/2019   IR ANGIOGRAM SELECTIVE EACH ADDITIONAL VESSEL  01/02/2021   IR ANGIOGRAM SELECTIVE EACH ADDITIONAL VESSEL  01/02/2021   IR ANGIOGRAM SELECTIVE EACH ADDITIONAL VESSEL  01/02/2021   IR ANGIOGRAM SELECTIVE EACH ADDITIONAL VESSEL  01/18/2021   IR ANGIOGRAM SELECTIVE EACH ADDITIONAL  VESSEL  02/15/2021   IR ANGIOGRAM SELECTIVE EACH ADDITIONAL VESSEL  02/15/2021   IR ANGIOGRAM VISCERAL SELECTIVE  07/25/2019   IR ANGIOGRAM VISCERAL SELECTIVE  07/25/2019   IR ANGIOGRAM VISCERAL SELECTIVE  01/02/2021   IR ANGIOGRAM VISCERAL SELECTIVE  01/02/2021   IR ANGIOGRAM VISCERAL SELECTIVE  01/18/2021   IR ANGIOGRAM VISCERAL SELECTIVE  02/15/2021   IR EMBO ARTERIAL NOT HEMORR HEMANG INC GUIDE ROADMAPPING  01/02/2021   IR EMBO TUMOR ORGAN ISCHEMIA INFARCT INC GUIDE ROADMAPPING  07/25/2019   IR EMBO TUMOR ORGAN ISCHEMIA INFARCT INC GUIDE ROADMAPPING  01/18/2021   IR EMBO TUMOR ORGAN ISCHEMIA INFARCT INC GUIDE ROADMAPPING  02/15/2021   IR RADIOLOGIST EVAL & MGMT  06/09/2019   IR RADIOLOGIST EVAL & MGMT  07/07/2019   IR RADIOLOGIST EVAL & MGMT  01/05/2020   IR RADIOLOGIST EVAL & MGMT  04/12/2020   IR RADIOLOGIST EVAL & MGMT  09/27/2020   IR RADIOLOGIST EVAL & MGMT  10/31/2020   IR RADIOLOGIST EVAL & MGMT  12/12/2020   IR RADIOLOGIST EVAL & MGMT  04/04/2021   IR RADIOLOGIST EVAL & MGMT  05/23/2021   IR RADIOLOGIST EVAL & MGMT  12/17/2021   IR US GUIDE VASC ACCESS RIGHT  07/25/2019   IR US GUIDE VASC ACCESS RIGHT  01/02/2021   IR US GUIDE VASC ACCESS RIGHT  01/18/2021   IR US GUIDE VASC ACCESS RIGHT  02/15/2021   PARTIAL COLECTOMY N/A 10/10/2016   Procedure: PARTIAL COLECTOMY;  Surgeon: Aviva Signs, MD;  [facility]: [facility];  Service: General;  Laterality: N/A;   planter warts Bilateral    both feet   POLYPECTOMY  09/23/2016   Procedure: POLYPECTOMY;  Surgeon: Danie Binder, MD;  [facility];  Service: Endoscopy;;  transverse colon polyps times 2, rectal polyp   skin grafts     due to planter warts   TOTAL KNEE ARTHROPLASTY Right 05/30/2015   Procedure: RIGHT TOTAL KNEE ARTHROPLASTY;  Surgeon: Ninetta Lights, MD;  Location: [place];  Service: Orthopedics;  Laterality: Right;    Family History  Problem Relation Age of Onset   Breast cancer Mother    Breast cancer Daughter    Colon cancer Neg Hx      Current Outpatient Medications on File Prior to Visit  Medication Sig Dispense Refill   acetaminophen (TYLENOL) 500 MG tablet Take 500 mg by mouth every 6 (six) hours as needed (for pain.).     APPLE CIDER VINEGAR PO Take 450 mg by mouth daily.     Atezolizumab (TECENTRIQ IV) Inject into the vein every 21 ( twenty-one) days.     Bevacizumab (AVASTIN IV) Inject into the vein every 21 ( twenty-one) days.     Calcium Carbonate-Vitamin D3 600-400 MG-UNIT TABS Take 1 tablet by mouth daily.     carvedilol (COREG) 6.25 MG tablet Take 6.25 mg by mouth 2 (two) times daily with a meal.     folic acid (FOLVITE) 1 MG tablet Take 1 mg by mouth daily.     furosemide (LASIX) 40 MG tablet Take 20 mg by mouth daily as needed for edema.     gabapentin (NEURONTIN) 600 MG tablet Take 600 mg by mouth daily.     Glucagon (GVOKE HYPOPEN 2-PACK) 1 MG/0.2ML SOAJ Inject 0.2 mLs into the skin as needed (Blood glucose less than 55). 0.4 mL 11   glucosamine-chondroitin 500-400 MG tablet Take 1 tablet by mouth 3 (three) times daily.     insulin degludec (TRESIBA FLEXTOUCH) 100 UNIT/ML FlexTouch Pen Inject 40 Units into the skin at bedtime. 36 mL 0   lidocaine-prilocaine (EMLA) cream Apply to affected area once 30 g 3   loperamide (IMODIUM) 2 MG capsule Take 2 mg by mouth daily.     losartan (COZAAR) 25 MG tablet Take 25 mg by mouth daily.     magnesium gluconate (MAGONATE) 500 MG tablet Take 500 mg by mouth daily as needed (cramps).     methotrexate (RHEUMATREX) 2.5 MG tablet Take 20 mg by mouth every Sunday. Caution:Chemotherapy. Protect from light.     Multiple Vitamins-Minerals (CENTRUM SILVER PO) Take 1 tablet by mouth daily.     NOVOLOG MIX 70/30 FLEXPEN (70-30) 100 UNIT/ML FlexPen Inject 20-30 Units into the skin See admin instructions. Take 30 units in the am and Take 20 units in the evening  3   omeprazole (PRILOSEC) 40 MG capsule Take 40 mg by mouth daily.   3   prochlorperazine (COMPAZINE) 10 MG tablet Take  1 tablet (10 mg total) by mouth every 6 (six) hours as needed for nausea or vomiting. 30 tablet 3   rivaroxaban (XARELTO) 20 MG TABS tablet Take 1 tablet (20 mg total) by mouth daily with supper. 30 tablet 6   rOPINIRole (REQUIP) 1 MG tablet Take 1 mg by mouth every morning. MAY TAKE 2 ADDITIONAL DOSES  AS NEEDED     Saline (  AYR SALINE NASAL DROPS) 0.65 % SOLN Place 1 spray into the nose daily as needed (congestion).     Current Facility-Administered Medications on File Prior to Visit  Medication Dose Route Frequency Provider Last Rate Last Admin   0.9 %  sodium chloride infusion   Intravenous Continuous Holley Bouche, NP   Stopped at 09/14/17 1440    Allergies  Allergen Reactions   Xeloda [Capecitabine] Other (See Comments)    Blisters and pain to skin to include feet and arms   Adhesive [Tape]     Adhesive tape and band-aids cause skin irritation   Neosporin Original [Bacitracin-Neomycin-Polymyxin] Other (See Comments)    blister   Sulfa Antibiotics Nausea Only and Other (See Comments)    Joint paint   Erythromycin Itching and Rash    burning    Social History   Substance and Sexual Activity  Alcohol Use No    Social History   Tobacco Use  Smoking Status Former   Packs/day: 1.00   Years: 40.00   Pack years: 40.00   Types: Cigarettes   Quit date: 08/18/2004   Years since quitting: 17.3  Smokeless Tobacco Never    Review of Systems  Constitutional:  Positive for chills and malaise/fatigue.  HENT:  Positive for sinus pain.   Eyes: Negative.   Respiratory:  Positive for cough and shortness of breath.   Cardiovascular: Negative.   Gastrointestinal:  Positive for abdominal pain, heartburn and nausea.  Genitourinary:  Positive for frequency and urgency.  Musculoskeletal:  Positive for back pain, joint pain and neck pain.  Skin:  Positive for rash.  Neurological:  Positive for dizziness.  Endo/Heme/Allergies:  Bruises/bleeds easily.  Psychiatric/Behavioral:  Negative.     Objective   Vitals:   01/02/22 1021  BP: (!) 147/75  Pulse: 96  Resp: 16  Temp: 98.1 F (36.7 C)  SpO2: 98%    Physical Exam Vitals reviewed.  Constitutional:      Appearance: Normal appearance. She is obese. She is not ill-appearing.  HENT:     Head: Normocephalic and atraumatic.  Cardiovascular:     Rate and Rhythm: Normal rate and regular rhythm.     Heart sounds: Normal heart sounds. No murmur heard.   No friction rub. No gallop.  Pulmonary:     Effort: Pulmonary effort is normal. No respiratory distress.     Breath sounds: Normal breath sounds. No stridor. No wheezing, rhonchi or rales.  Skin:    General: Skin is warm and dry.  Neurological:     Mental Status: She is alert and oriented to person, place, and time.  Oncology notes reviewed  Assessment  Hepatocellular carcinoma, need for central venous access for chemotherapy Chronic anticoagulation on Xarelto Plan  Patient is scheduled for Port-A-Cath insertion on 01/08/2022.  The risks and benefits of the procedure including bleeding, infection, and the possibility of pneumothorax were fully explained to the patient, who gave informed consent.  She will hold her Xarelto 2 days prior to the procedure.

## 2022-01-02 NOTE — Progress Notes (Signed)
Wanda Curry; [id number]; 21-Apr-1946   HPI Patient is a 76 year old white female who was referred to my care by Dr. Delton Coombes of oncology for Port-A-Cath placement.  She is undergoing chemotherapy for hepatocellular carcinoma.  She is on Xarelto for history of a right leg DVT. Past Medical History:  Diagnosis Date   Acute pulmonary embolism ([place]) 04/15/2017   Anemia    Arthritis    RA   Cancer (HCC)    skin cancer, colon, liver   Depression    Diabetes mellitus without complication (HCC)    Dyspnea    Fibromyalgia    GERD (gastroesophageal reflux disease)    GI bleed 08/12/2017   Heart murmur    ECHO scheduled 05-25-2015   Hyperlipidemia    Neuropathy    PONV (postoperative nausea and vomiting)    Puncture wound of great toe    Right leg DVT ([place]) 12/01/2020    Past Surgical History:  Procedure Laterality Date   ABDOMINAL HYSTERECTOMY     BIOPSY  09/23/2016   Procedure: BIOPSY;  Surgeon: Danie Binder, MD;  [facility];  Service: Endoscopy;;  hepatic flexure mass   CHOLECYSTECTOMY     COLONOSCOPY N/A 08/13/2017   Procedure: COLONOSCOPY;  Surgeon: Daneil Dolin, MD;  [facility];  Service: Endoscopy;  Laterality: N/A;   COLONOSCOPY WITH PROPOFOL N/A 09/23/2016   Procedure: COLONOSCOPY WITH PROPOFOL;  Surgeon: Danie Binder, MD;  [facility];  Service: Endoscopy;  Laterality: N/A;  7:30 am   HARDWARE REMOVAL Right 05/30/2015   Procedure: HARDWARE REMOVAL;  Surgeon: Ninetta Lights, MD;  Location: [place];  Service: Orthopedics;  Laterality: Right;   IR ANGIOGRAM SELECTIVE EACH ADDITIONAL VESSEL  07/25/2019   IR ANGIOGRAM SELECTIVE EACH ADDITIONAL VESSEL  07/25/2019   IR ANGIOGRAM SELECTIVE EACH ADDITIONAL VESSEL  01/02/2021   IR ANGIOGRAM SELECTIVE EACH ADDITIONAL VESSEL  01/02/2021   IR ANGIOGRAM SELECTIVE EACH ADDITIONAL VESSEL  01/02/2021   IR ANGIOGRAM SELECTIVE EACH ADDITIONAL VESSEL  01/18/2021   IR ANGIOGRAM SELECTIVE EACH ADDITIONAL  VESSEL  02/15/2021   IR ANGIOGRAM SELECTIVE EACH ADDITIONAL VESSEL  02/15/2021   IR ANGIOGRAM VISCERAL SELECTIVE  07/25/2019   IR ANGIOGRAM VISCERAL SELECTIVE  07/25/2019   IR ANGIOGRAM VISCERAL SELECTIVE  01/02/2021   IR ANGIOGRAM VISCERAL SELECTIVE  01/02/2021   IR ANGIOGRAM VISCERAL SELECTIVE  01/18/2021   IR ANGIOGRAM VISCERAL SELECTIVE  02/15/2021   IR EMBO ARTERIAL NOT HEMORR HEMANG INC GUIDE ROADMAPPING  01/02/2021   IR EMBO TUMOR ORGAN ISCHEMIA INFARCT INC GUIDE ROADMAPPING  07/25/2019   IR EMBO TUMOR ORGAN ISCHEMIA INFARCT INC GUIDE ROADMAPPING  01/18/2021   IR EMBO TUMOR ORGAN ISCHEMIA INFARCT INC GUIDE ROADMAPPING  02/15/2021   IR RADIOLOGIST EVAL & MGMT  06/09/2019   IR RADIOLOGIST EVAL & MGMT  07/07/2019   IR RADIOLOGIST EVAL & MGMT  01/05/2020   IR RADIOLOGIST EVAL & MGMT  04/12/2020   IR RADIOLOGIST EVAL & MGMT  09/27/2020   IR RADIOLOGIST EVAL & MGMT  10/31/2020   IR RADIOLOGIST EVAL & MGMT  12/12/2020   IR RADIOLOGIST EVAL & MGMT  04/04/2021   IR RADIOLOGIST EVAL & MGMT  05/23/2021   IR RADIOLOGIST EVAL & MGMT  12/17/2021   IR US GUIDE VASC ACCESS RIGHT  07/25/2019   IR US GUIDE VASC ACCESS RIGHT  01/02/2021   IR US GUIDE VASC ACCESS RIGHT  01/18/2021   IR US GUIDE VASC ACCESS RIGHT  02/15/2021   PARTIAL COLECTOMY N/A 10/10/2016   Procedure: PARTIAL COLECTOMY;  Surgeon: Aviva Signs, MD;  [facility];  Service: General;  Laterality: N/A;   planter warts Bilateral    both feet   POLYPECTOMY  09/23/2016   Procedure: POLYPECTOMY;  Surgeon: Danie Binder, MD;  [facility];  Service: Endoscopy;;  transverse colon polyps times 2, rectal polyp   skin grafts     due to planter warts   TOTAL KNEE ARTHROPLASTY Right 05/30/2015   Procedure: RIGHT TOTAL KNEE ARTHROPLASTY;  Surgeon: Ninetta Lights, MD;  Location: [place];  Service: Orthopedics;  Laterality: Right;    Family History  Problem Relation Age of Onset   Breast cancer Mother    Breast cancer Daughter    Colon cancer Neg Hx      Current Outpatient Medications on File Prior to Visit  Medication Sig Dispense Refill   acetaminophen (TYLENOL) 500 MG tablet Take 500 mg by mouth every 6 (six) hours as needed (for pain.).     APPLE CIDER VINEGAR PO Take 450 mg by mouth daily.     Atezolizumab (TECENTRIQ IV) Inject into the vein every 21 ( twenty-one) days.     Bevacizumab (AVASTIN IV) Inject into the vein every 21 ( twenty-one) days.     Calcium Carbonate-Vitamin D3 600-400 MG-UNIT TABS Take 1 tablet by mouth daily.     carvedilol (COREG) 6.25 MG tablet Take 6.25 mg by mouth 2 (two) times daily with a meal.     folic acid (FOLVITE) 1 MG tablet Take 1 mg by mouth daily.     furosemide (LASIX) 40 MG tablet Take 20 mg by mouth daily as needed for edema.     gabapentin (NEURONTIN) 600 MG tablet Take 600 mg by mouth daily.     Glucagon (GVOKE HYPOPEN 2-PACK) 1 MG/0.2ML SOAJ Inject 0.2 mLs into the skin as needed (Blood glucose less than 55). 0.4 mL 11   glucosamine-chondroitin 500-400 MG tablet Take 1 tablet by mouth 3 (three) times daily.     insulin degludec (TRESIBA FLEXTOUCH) 100 UNIT/ML FlexTouch Pen Inject 40 Units into the skin at bedtime. 36 mL 0   lidocaine-prilocaine (EMLA) cream Apply to affected area once 30 g 3   loperamide (IMODIUM) 2 MG capsule Take 2 mg by mouth daily.     losartan (COZAAR) 25 MG tablet Take 25 mg by mouth daily.     magnesium gluconate (MAGONATE) 500 MG tablet Take 500 mg by mouth daily as needed (cramps).     methotrexate (RHEUMATREX) 2.5 MG tablet Take 20 mg by mouth every Sunday. Caution:Chemotherapy. Protect from light.     Multiple Vitamins-Minerals (CENTRUM SILVER PO) Take 1 tablet by mouth daily.     NOVOLOG MIX 70/30 FLEXPEN (70-30) 100 UNIT/ML FlexPen Inject 20-30 Units into the skin See admin instructions. Take 30 units in the am and Take 20 units in the evening  3   omeprazole (PRILOSEC) 40 MG capsule Take 40 mg by mouth daily.   3   prochlorperazine (COMPAZINE) 10 MG tablet Take  1 tablet (10 mg total) by mouth every 6 (six) hours as needed for nausea or vomiting. 30 tablet 3   rivaroxaban (XARELTO) 20 MG TABS tablet Take 1 tablet (20 mg total) by mouth daily with supper. 30 tablet 6   rOPINIRole (REQUIP) 1 MG tablet Take 1 mg by mouth every morning. MAY TAKE 2 ADDITIONAL DOSES  AS NEEDED     Saline (  AYR SALINE NASAL DROPS) 0.65 % SOLN Place 1 spray into the nose daily as needed (congestion).     Current Facility-Administered Medications on File Prior to Visit  Medication Dose Route Frequency Provider Last Rate Last Admin   0.9 %  sodium chloride infusion   Intravenous Continuous Holley Bouche, NP   Stopped at 09/14/17 1440    Allergies  Allergen Reactions   Xeloda [Capecitabine] Other (See Comments)    Blisters and pain to skin to include feet and arms   Adhesive [Tape]     Adhesive tape and band-aids cause skin irritation   Neosporin Original [Bacitracin-Neomycin-Polymyxin] Other (See Comments)    blister   Sulfa Antibiotics Nausea Only and Other (See Comments)    Joint paint   Erythromycin Itching and Rash    burning    Social History   Substance and Sexual Activity  Alcohol Use No    Social History   Tobacco Use  Smoking Status Former   Packs/day: 1.00   Years: 40.00   Pack years: 40.00   Types: Cigarettes   Quit date: 08/18/2004   Years since quitting: 17.3  Smokeless Tobacco Never    Review of Systems  Constitutional:  Positive for chills and malaise/fatigue.  HENT:  Positive for sinus pain.   Eyes: Negative.   Respiratory:  Positive for cough and shortness of breath.   Cardiovascular: Negative.   Gastrointestinal:  Positive for abdominal pain, heartburn and nausea.  Genitourinary:  Positive for frequency and urgency.  Musculoskeletal:  Positive for back pain, joint pain and neck pain.  Skin:  Positive for rash.  Neurological:  Positive for dizziness.  Endo/Heme/Allergies:  Bruises/bleeds easily.  Psychiatric/Behavioral:  Negative.     Objective   Vitals:   01/02/22 1021  BP: (!) 147/75  Pulse: 96  Resp: 16  Temp: 98.1 F (36.7 C)  SpO2: 98%    Physical Exam Vitals reviewed.  Constitutional:      Appearance: Normal appearance. She is obese. She is not ill-appearing.  HENT:     Head: Normocephalic and atraumatic.  Cardiovascular:     Rate and Rhythm: Normal rate and regular rhythm.     Heart sounds: Normal heart sounds. No murmur heard.   No friction rub. No gallop.  Pulmonary:     Effort: Pulmonary effort is normal. No respiratory distress.     Breath sounds: Normal breath sounds. No stridor. No wheezing, rhonchi or rales.  Skin:    General: Skin is warm and dry.  Neurological:     Mental Status: She is alert and oriented to person, place, and time.  Oncology notes reviewed  Assessment  Hepatocellular carcinoma, need for central venous access for chemotherapy Chronic anticoagulation on Xarelto Plan  Patient is scheduled for Port-A-Cath insertion on 01/08/2022.  The risks and benefits of the procedure including bleeding, infection, and the possibility of pneumothorax were fully explained to the patient, who gave informed consent.  She will hold her Xarelto 2 days prior to the procedure.

## 2022-01-02 NOTE — Patient Instructions (Signed)
Stop Xarelto on Monday, 5/22.

## 2022-01-06 ENCOUNTER — Encounter (HOSPITAL_COMMUNITY)
Admission: RE | Admit: 2022-01-06 | Discharge: 2022-01-06 | Disposition: A | Discharge status: Home / Self Care | Payer: Medicare Other | Source: Ambulatory Visit | Attending: General Surgery | Admitting: General Surgery

## 2022-01-08 ENCOUNTER — Other Ambulatory Visit: Payer: Self-pay

## 2022-01-08 ENCOUNTER — Ambulatory Visit (HOSPITAL_COMMUNITY): Payer: Medicare Other

## 2022-01-08 ENCOUNTER — Ambulatory Visit (HOSPITAL_COMMUNITY)
Admission: RE | Admit: 2022-01-08 | Discharge: 2022-01-08 | Disposition: A | Discharge status: Home / Self Care | Payer: Medicare Other | Attending: General Surgery | Admitting: General Surgery

## 2022-01-08 ENCOUNTER — Ambulatory Visit (HOSPITAL_COMMUNITY): Payer: Medicare Other | Admitting: Anesthesiology

## 2022-01-08 ENCOUNTER — Encounter (HOSPITAL_COMMUNITY)
Admission: RE | Disposition: A | Discharge status: Home / Self Care | Payer: Self-pay | Source: Home / Self Care | Attending: General Surgery

## 2022-01-08 ENCOUNTER — Encounter (HOSPITAL_COMMUNITY): Payer: Self-pay | Admitting: General Surgery

## 2022-01-08 ENCOUNTER — Ambulatory Visit (HOSPITAL_BASED_OUTPATIENT_CLINIC_OR_DEPARTMENT_OTHER): Payer: Medicare Other | Admitting: Anesthesiology

## 2022-01-08 DIAGNOSIS — M069 Rheumatoid arthritis, unspecified: Secondary | ICD-10-CM

## 2022-01-08 DIAGNOSIS — C220 Liver cell carcinoma: Secondary | ICD-10-CM

## 2022-01-08 DIAGNOSIS — C801 Malignant (primary) neoplasm, unspecified: Secondary | ICD-10-CM

## 2022-01-08 DIAGNOSIS — E119 Type 2 diabetes mellitus without complications: Secondary | ICD-10-CM

## 2022-01-08 DIAGNOSIS — Z794 Long term (current) use of insulin: Secondary | ICD-10-CM | POA: Insufficient documentation

## 2022-01-08 DIAGNOSIS — Z86718 Personal history of other venous thrombosis and embolism: Secondary | ICD-10-CM | POA: Diagnosis not present

## 2022-01-08 DIAGNOSIS — Z87891 Personal history of nicotine dependence: Secondary | ICD-10-CM | POA: Insufficient documentation

## 2022-01-08 DIAGNOSIS — Z7901 Long term (current) use of anticoagulants: Secondary | ICD-10-CM | POA: Diagnosis not present

## 2022-01-08 DIAGNOSIS — K922 Gastrointestinal hemorrhage, unspecified: Secondary | ICD-10-CM

## 2022-01-08 DIAGNOSIS — D630 Anemia in neoplastic disease: Secondary | ICD-10-CM

## 2022-01-08 DIAGNOSIS — M179 Osteoarthritis of knee, unspecified: Secondary | ICD-10-CM

## 2022-01-08 DIAGNOSIS — K529 Noninfective gastroenteritis and colitis, unspecified: Secondary | ICD-10-CM

## 2022-01-08 DIAGNOSIS — C189 Malignant neoplasm of colon, unspecified: Secondary | ICD-10-CM

## 2022-01-08 DIAGNOSIS — Z86711 Personal history of pulmonary embolism: Secondary | ICD-10-CM

## 2022-01-08 DIAGNOSIS — B351 Tinea unguium: Secondary | ICD-10-CM

## 2022-01-08 DIAGNOSIS — I10 Essential (primary) hypertension: Secondary | ICD-10-CM | POA: Diagnosis not present

## 2022-01-08 DIAGNOSIS — G2581 Restless legs syndrome: Secondary | ICD-10-CM

## 2022-01-08 DIAGNOSIS — N183 Chronic kidney disease, stage 3 unspecified: Secondary | ICD-10-CM

## 2022-01-08 DIAGNOSIS — K219 Gastro-esophageal reflux disease without esophagitis: Secondary | ICD-10-CM

## 2022-01-08 HISTORY — PX: PORTACATH PLACEMENT: SHX2246

## 2022-01-08 LAB — GLUCOSE, CAPILLARY
Glucose-Capillary: 212 mg/dL — ABNORMAL HIGH (ref 70–99)
Glucose-Capillary: 222 mg/dL — ABNORMAL HIGH (ref 70–99)

## 2022-01-08 SURGERY — INSERTION, TUNNELED CENTRAL VENOUS DEVICE, WITH PORT
Anesthesia: General | Site: Chest | Laterality: Left

## 2022-01-08 MED ORDER — CHLORHEXIDINE GLUCONATE 0.12 % MT SOLN
15.0000 mL | Freq: Once | OROMUCOSAL | Status: AC
  Administered 2022-01-08: 15 mL via OROMUCOSAL

## 2022-01-08 MED ORDER — ORAL CARE MOUTH RINSE: 15.0000 mL | Freq: Once | OROMUCOSAL | Status: AC

## 2022-01-08 MED ORDER — LACTATED RINGERS IV SOLN
INTRAVENOUS | Status: DC
  Administered 2022-01-08: 1000 mL via INTRAVENOUS

## 2022-01-08 MED ORDER — ONDANSETRON HCL 4 MG/2ML IJ SOLN
INTRAMUSCULAR | Status: AC
  Filled 2022-01-08: qty 2

## 2022-01-08 MED ORDER — HEPARIN SOD (PORK) LOCK FLUSH 100 UNIT/ML IV SOLN
INTRAVENOUS | Status: DC | PRN
Start: 2022-01-08 — End: 2022-01-08
  Administered 2022-01-08: 500 [IU] via INTRAVENOUS

## 2022-01-08 MED ORDER — TRAMADOL HCL 50 MG PO TABS: 50.0000 mg | ORAL_TABLET | Freq: Four times a day (QID) | ORAL | 0 refills | Status: DC | PRN

## 2022-01-08 MED ORDER — SODIUM CHLORIDE (PF) 0.9 % IJ SOLN
INTRAMUSCULAR | Status: DC | PRN
Start: 2022-01-08 — End: 2022-01-08
  Administered 2022-01-08: 10 mL via INTRAVENOUS

## 2022-01-08 MED ORDER — LIDOCAINE HCL (PF) 1 % IJ SOLN
INTRAMUSCULAR | Status: AC
  Filled 2022-01-08: qty 30

## 2022-01-08 MED ORDER — CHLORHEXIDINE GLUCONATE CLOTH 2 % EX PADS: 6.0000 | MEDICATED_PAD | Freq: Once | CUTANEOUS | Status: DC

## 2022-01-08 MED ORDER — PROPOFOL 500 MG/50ML IV EMUL
INTRAVENOUS | Status: DC | PRN
  Administered 2022-01-08: 50 ug/kg/min via INTRAVENOUS

## 2022-01-08 MED ORDER — LIDOCAINE HCL (PF) 2 % IJ SOLN
INTRAMUSCULAR | Status: AC
  Filled 2022-01-08: qty 5

## 2022-01-08 MED ORDER — CEFAZOLIN SODIUM-DEXTROSE 2-4 GM/100ML-% IV SOLN
INTRAVENOUS | Status: AC
  Filled 2022-01-08: qty 100

## 2022-01-08 MED ORDER — LIDOCAINE HCL 1 % IJ SOLN
INTRAMUSCULAR | Status: DC | PRN
  Administered 2022-01-08: 50 mg via INTRADERMAL

## 2022-01-08 MED ORDER — CEFAZOLIN SODIUM-DEXTROSE 2-4 GM/100ML-% IV SOLN
2.0000 g | INTRAVENOUS | Status: AC
  Administered 2022-01-08: 2 g via INTRAVENOUS

## 2022-01-08 MED ORDER — LIDOCAINE HCL (PF) 1 % IJ SOLN
INTRAMUSCULAR | Status: DC | PRN
  Administered 2022-01-08: 7 mL

## 2022-01-08 MED ORDER — FENTANYL CITRATE (PF) 100 MCG/2ML IJ SOLN
INTRAMUSCULAR | Status: DC | PRN
  Administered 2022-01-08: 25 ug via INTRAVENOUS

## 2022-01-08 MED ORDER — FENTANYL CITRATE (PF) 100 MCG/2ML IJ SOLN
INTRAMUSCULAR | Status: AC
  Filled 2022-01-08: qty 2

## 2022-01-08 MED ORDER — HEPARIN SOD (PORK) LOCK FLUSH 100 UNIT/ML IV SOLN
INTRAVENOUS | Status: AC
  Filled 2022-01-08: qty 5

## 2022-01-08 SURGICAL SUPPLY — 28 items
APPLICATOR CHLORAPREP 10.5 ORG (MISCELLANEOUS) ×2 IMPLANT
BAG DECANTER FOR FLEXI CONT (MISCELLANEOUS) ×2 IMPLANT
CLOTH BEACON ORANGE TIMEOUT ST (SAFETY) ×2 IMPLANT
COVER LIGHT HANDLE STERIS (MISCELLANEOUS) ×4 IMPLANT
DECANTER SPIKE VIAL GLASS SM (MISCELLANEOUS) ×2 IMPLANT
DERMABOND ADVANCED (GAUZE/BANDAGES/DRESSINGS) ×1
DERMABOND ADVANCED .7 DNX12 (GAUZE/BANDAGES/DRESSINGS) ×1 IMPLANT
DRAPE C-ARM FOLDED MOBILE STRL (DRAPES) ×2 IMPLANT
ELECT REM PT RETURN 9FT ADLT (ELECTROSURGICAL) ×2
ELECTRODE REM PT RTRN 9FT ADLT (ELECTROSURGICAL) ×1 IMPLANT
GLOVE BIOGEL PI IND STRL 7.0 (GLOVE) ×2 IMPLANT
GLOVE BIOGEL PI INDICATOR 7.0 (GLOVE) ×2
GLOVE SURG SS PI 7.5 STRL IVOR (GLOVE) ×2 IMPLANT
GOWN STRL REUS W/TWL LRG LVL3 (GOWN DISPOSABLE) ×4 IMPLANT
IV NS 500ML (IV SOLUTION) ×1
IV NS 500ML BAXH (IV SOLUTION) ×1 IMPLANT
KIT PORT POWER 8FR ISP MRI (Port) ×2 IMPLANT
KIT TURNOVER KIT A (KITS) ×2 IMPLANT
NDL HYPO 25X1 1.5 SAFETY (NEEDLE) ×1 IMPLANT
NEEDLE HYPO 25X1 1.5 SAFETY (NEEDLE) ×2 IMPLANT
PACK MINOR (CUSTOM PROCEDURE TRAY) ×2 IMPLANT
PAD ARMBOARD 7.5X6 YLW CONV (MISCELLANEOUS) ×2 IMPLANT
SET BASIN LINEN APH (SET/KITS/TRAYS/PACK) ×2 IMPLANT
SUT MNCRL AB 4-0 PS2 18 (SUTURE) ×2 IMPLANT
SUT VIC AB 3-0 SH 27 (SUTURE) ×1
SUT VIC AB 3-0 SH 27X BRD (SUTURE) ×1 IMPLANT
SYR 5ML LL (SYRINGE) ×2 IMPLANT
SYR CONTROL 10ML LL (SYRINGE) ×2 IMPLANT

## 2022-01-08 NOTE — Op Note (Signed)
Patient:  Wanda Curry  DOB:  07-25-1946  MRN:  [id number]   Preop Diagnosis: Hepatocellular carcinoma, need for central venous access  Postop Diagnosis: Same  Procedure: Port-A-Cath insertion  Surgeon: Aviva Signs, MD  Anes: MAC  Indications: Patient is a 76 year old white female who is undergoing chemotherapy for hepatocellular carcinoma and needs central venous access.  The risks and benefits of the procedure including bleeding, infection, and pneumothorax were fully explained to the patient, who gave informed consent.  Procedure note: The patient was placed in the Trendelenburg position after the left upper chest was prepped and draped using usual sterile technique with ChloraPrep.  Surgical site confirmation was performed.  1% Xylocaine was used for local anesthesia.  An incision was made below the left clavicle.  A subcutaneous pocket was formed.  A needle was advanced into the left subclavian vein using the Seldinger technique without difficulty.  The guidewire was then advanced into the right atrium under fluoroscopic guidance.  An introducer and peel-away sheath were placed over the guidewire.  The catheter was inserted through the peel-away sheath and the peel-away sheath was removed.  The catheter was then attached to the port and the port placed in subcutaneous pocket.  Adequate positioning was confirmed by fluoroscopy.  Good backflow of venous blood was noted on aspiration of the port.  The port was flushed with heparin flush.  The subcutaneous layer was reapproximated using a 3-0 Vicryl interrupted suture.  The skin was closed using a 4-0 Monocryl subcuticular suture.  Dermabond was applied.  All tape and needle counts were correct at the end of the procedure.  The patient was awakened and transferred to PACU in stable condition.  A chest x-ray will be performed at that time.  Complications: None  EBL: Minimal  Specimen: None

## 2022-01-08 NOTE — Transfer of Care (Signed)
Immediate Anesthesia Transfer of Care Note  Patient: Wanda Curry  Procedure(s) Performed: INSERTION PORT-A-CATH (Left: Chest)  Patient Location: PACU  Anesthesia Type:General  Level of Consciousness: awake  Airway & Oxygen Therapy: Patient Spontanous Breathing  Post-op Assessment: Report given to RN  Post vital signs: Reviewed and stable  Last Vitals:  Vitals Value Taken Time  BP 120/52 01/08/22 1102  Temp 37.1 C 01/08/22 1102  Pulse 71 01/08/22 1104  Resp 14 01/08/22 1104  SpO2 100 % 01/08/22 1104  Vitals shown include unvalidated device data.  Last Pain:  Vitals:   01/08/22 1102  TempSrc:   PainSc: 0-No pain      Patients Stated Pain Goal: 8 (60/60/04 5997)  Complications: No notable events documented.

## 2022-01-08 NOTE — Anesthesia Preprocedure Evaluation (Signed)
Anesthesia Evaluation  Patient identified by MRN, date of birth, ID band Patient awake    Reviewed: Allergy & Precautions, NPO status , Patient's Chart, lab work & pertinent test results  History of Anesthesia Complications (+) PONV and history of anesthetic complications  Airway Mallampati: II  TM Distance: >3 FB Neck ROM: Full    Dental  (+) Edentulous Upper, Edentulous Lower   Pulmonary shortness of breath and with exertion, former smoker, PE   Pulmonary exam normal breath sounds clear to auscultation       Cardiovascular Exercise Tolerance: Poor hypertension, Pt. on medications + DVT  + Valvular Problems/Murmurs  Rhythm:Irregular Rate:Normal + Systolic murmurs    Neuro/Psych PSYCHIATRIC DISORDERS Depression  Neuromuscular disease (neuropathy, RLS)    GI/Hepatic GERD  Medicated and Controlled,(+) Cirrhosis  (hepatocellular carcinoma)      , Colon cancer   Endo/Other  diabetes, Well Controlled, Type 2, Oral Hypoglycemic Agents, Insulin Dependent  Renal/GU Renal InsufficiencyRenal disease  negative genitourinary   Musculoskeletal  (+) Arthritis , Osteoarthritis and Rheumatoid disorders,  Fibromyalgia -  Abdominal   Peds negative pediatric ROS (+)  Hematology  (+) Blood dyscrasia, anemia ,   Anesthesia Other Findings   Reproductive/Obstetrics negative OB ROS                             Anesthesia Physical Anesthesia Plan  ASA: 3  Anesthesia Plan: General   Post-op Pain Management: Minimal or no pain anticipated   Induction: Intravenous  PONV Risk Score and Plan: Propofol infusion and Ondansetron  Airway Management Planned: Nasal Cannula and Natural Airway  Additional Equipment:   Intra-op Plan:   Post-operative Plan:   Informed Consent: I have reviewed the patients History and Physical, chart, labs and discussed the procedure including the risks, benefits and  alternatives for the proposed anesthesia with the patient or authorized representative who has indicated his/her understanding and acceptance.       Plan Discussed with: CRNA and Surgeon  Anesthesia Plan Comments: (Possible GA with airway)        Anesthesia Quick Evaluation

## 2022-01-08 NOTE — Anesthesia Postprocedure Evaluation (Signed)
Anesthesia Post Note  Patient: Wanda Curry  Procedure(s) Performed: INSERTION PORT-A-CATH (Left: Chest)  Patient location during evaluation: Phase II Anesthesia Type: General Level of consciousness: awake and alert and oriented Pain management: pain level controlled Vital Signs Assessment: post-procedure vital signs reviewed and stable Respiratory status: spontaneous breathing, nonlabored ventilation and respiratory function stable Cardiovascular status: blood pressure returned to baseline and stable Postop Assessment: no apparent nausea or vomiting Anesthetic complications: no   No notable events documented.   Last Vitals:  Vitals:   01/08/22 1115 01/08/22 1130  BP: (!) 138/57   Pulse:  75  Resp: 10 16  Temp:  36.5 C  SpO2: 100% 98%    Last Pain:  Vitals:   01/08/22 1130  TempSrc: Oral  PainSc: 0-No pain                 [place] Emmitt C Shristi Scheib

## 2022-01-08 NOTE — Interval H&P Note (Signed)
History and Physical Interval Note:  01/08/2022 8:59 AM  Wanda Curry  has presented today for surgery, with the diagnosis of HEPATOCELLULAR CARCINOMA.  The various methods of treatment have been discussed with the patient and family. After consideration of risks, benefits and other options for treatment, the patient has consented to  Procedure(s) with comments: INSERTION PORT-A-CATH (N/A) - pt knows to arrive at 8:30 as a surgical intervention.  The patient's history has been reviewed, patient examined, no change in status, stable for surgery.  I have reviewed the patient's chart and labs.  Questions were answered to the patient's satisfaction.     Aviva Signs

## 2022-01-09 ENCOUNTER — Encounter (HOSPITAL_COMMUNITY): Payer: Self-pay | Admitting: General Surgery

## 2022-01-14 NOTE — Progress Notes (Shared)
Wanda Curry [place],  [address]   CLINIC:  Medical Oncology/Hematology  PCP:  Wanda Spar, MD [address] / [place] [address] [phone]   REASON FOR VISIT:  Follow-up for right colon cancer, hepatocellular carcinoma and iron deficiency state  PRIOR THERAPY:  1. Right hemicolectomy on 10/10/2016. 2. Intermittent Feraheme last on 09/22/2018. 3. Bland embolization of hepatic lesion on 07/25/2019.  NGS Results: not done  CURRENT THERAPY: surveillance  BRIEF ONCOLOGIC HISTORY:  Oncology History  Colon cancer ([place])  10/10/2016 Initial Diagnosis   Colon cancer ([facility] [place])    10/10/2016 Surgery   Partial colectomy by Dr. Arnoldo Morale  Pathology shows  a 2.1 cm grade 2 adenocarcinoma of the ascending colon with invasion through the muscularis propria into the peri-colorectal tissues.      11/17/2016 PET scan   The hepatic lesion seen on the CT scan and MRI does not demonstrate hypermetabolism and is likely a benign entity.   Right maxillary sinus disease with associated hypermetabolism.   Scattered pulmonary nodules, likely benign. No follow-up needed if patient is low-risk (and has no known or suspected primary neoplasm). Non-contrast chest CT can be considered in 12 months if patient is high-risk.     12/25/2016 - 02/04/2017 Adjuvant Chemotherapy   Xeloda '2000mg'$  PO BID take for 14 days out of 21 days. Plan for total of 24 weeks of treatment.    Hepatocellular carcinoma ([place])  05/17/2019 Initial Diagnosis   Hepatocellular carcinoma ([place])    12/26/2021 -  Chemotherapy   Patient is on Treatment Plan : [place] Atezolizumab + Bevacizumab q21d Maintenance        CANCER STAGING:  Cancer Staging  Colon cancer [facility]) Staging form: Colon and Rectum, AJCC 8th Edition - Clinical: Stage IIA (cT3, cN0, cM0) - Signed by [name], MD on 11/26/2016   INTERVAL HISTORY:  Wanda Curry, a 76 y.o. female, returns for routine follow-up  and consideration for next cycle of chemotherapy. Wanda Curry was last seen on 12/19/2021.  Due for cycle #2 of Atezolizumab + Bevacizumab today.   Overall, she tells me she has been feeling pretty well. ***  Overall, she feels ready for next cycle of chemo today.    REVIEW OF SYSTEMS:  Review of Systems  All other systems reviewed and are negative.  PAST MEDICAL/SURGICAL HISTORY:  Past Medical History:  Diagnosis Date   Acute pulmonary embolism ([place]) 04/15/2017   Anemia    Arthritis    RA   Cancer (HCC)    skin cancer, colon, liver   Depression    Diabetes mellitus without complication (HCC)    Dyspnea    Fibromyalgia    GERD (gastroesophageal reflux disease)    GI bleed 08/12/2017   Heart murmur    ECHO scheduled 05-25-2015   Hyperlipidemia    Neuropathy    PONV (postoperative nausea and vomiting)    Puncture wound of great toe    Right leg DVT ([place]) 12/01/2020   Past Surgical History:  Procedure Laterality Date   ABDOMINAL HYSTERECTOMY     BIOPSY  09/23/2016   Procedure: BIOPSY;  Surgeon: Danie Binder, MD;  [facility];  Service: Endoscopy;;  hepatic flexure mass   CHOLECYSTECTOMY     COLONOSCOPY N/A 08/13/2017   Procedure: COLONOSCOPY;  Surgeon: Daneil Dolin, MD;  [facility];  Service: Endoscopy;  Laterality: N/A;   COLONOSCOPY WITH PROPOFOL N/A 09/23/2016   Procedure: COLONOSCOPY WITH  PROPOFOL;  Surgeon: Danie Binder, MD;  [facility];  Service: Endoscopy;  Laterality: N/A;  7:30 am   HARDWARE REMOVAL Right 05/30/2015   Procedure: HARDWARE REMOVAL;  Surgeon: Ninetta Lights, MD;  Location: [facility];  Service: Orthopedics;  Laterality: Right;   IR ANGIOGRAM SELECTIVE EACH ADDITIONAL VESSEL  07/25/2019   IR ANGIOGRAM SELECTIVE EACH ADDITIONAL VESSEL  07/25/2019   IR ANGIOGRAM SELECTIVE EACH ADDITIONAL VESSEL  01/02/2021   IR ANGIOGRAM SELECTIVE EACH ADDITIONAL VESSEL  01/02/2021   IR ANGIOGRAM SELECTIVE EACH ADDITIONAL VESSEL   01/02/2021   IR ANGIOGRAM SELECTIVE EACH ADDITIONAL VESSEL  01/18/2021   IR ANGIOGRAM SELECTIVE EACH ADDITIONAL VESSEL  02/15/2021   IR ANGIOGRAM SELECTIVE EACH ADDITIONAL VESSEL  02/15/2021   IR ANGIOGRAM VISCERAL SELECTIVE  07/25/2019   IR ANGIOGRAM VISCERAL SELECTIVE  07/25/2019   IR ANGIOGRAM VISCERAL SELECTIVE  01/02/2021   IR ANGIOGRAM VISCERAL SELECTIVE  01/02/2021   IR ANGIOGRAM VISCERAL SELECTIVE  01/18/2021   IR ANGIOGRAM VISCERAL SELECTIVE  02/15/2021   IR EMBO ARTERIAL NOT HEMORR HEMANG INC GUIDE ROADMAPPING  01/02/2021   IR EMBO TUMOR ORGAN ISCHEMIA INFARCT INC GUIDE ROADMAPPING  07/25/2019   IR EMBO TUMOR ORGAN ISCHEMIA INFARCT INC GUIDE ROADMAPPING  01/18/2021   IR EMBO TUMOR ORGAN ISCHEMIA INFARCT INC GUIDE ROADMAPPING  02/15/2021   IR RADIOLOGIST EVAL & MGMT  06/09/2019   IR RADIOLOGIST EVAL & MGMT  07/07/2019   IR RADIOLOGIST EVAL & MGMT  01/05/2020   IR RADIOLOGIST EVAL & MGMT  04/12/2020   IR RADIOLOGIST EVAL & MGMT  09/27/2020   IR RADIOLOGIST EVAL & MGMT  10/31/2020   IR RADIOLOGIST EVAL & MGMT  12/12/2020   IR RADIOLOGIST EVAL & MGMT  04/04/2021   IR RADIOLOGIST EVAL & MGMT  05/23/2021   IR RADIOLOGIST EVAL & MGMT  12/17/2021   IR US GUIDE VASC ACCESS RIGHT  07/25/2019   IR US GUIDE VASC ACCESS RIGHT  01/02/2021   IR US GUIDE VASC ACCESS RIGHT  01/18/2021   IR US GUIDE VASC ACCESS RIGHT  02/15/2021   PARTIAL COLECTOMY N/A 10/10/2016   Procedure: PARTIAL COLECTOMY;  Surgeon: Aviva Signs, MD;  [facility];  Service: General;  Laterality: N/A;   planter warts Bilateral    both feet   POLYPECTOMY  09/23/2016   Procedure: POLYPECTOMY;  Surgeon: Danie Binder, MD;  [facility];  Service: Endoscopy;;  transverse colon polyps times 2, rectal polyp   PORTACATH PLACEMENT Left 01/08/2022   Procedure: INSERTION PORT-A-CATH;  Surgeon: Aviva Signs, MD;  Location[facility];  Service: General;  Laterality: Left;   skin grafts     due to planter warts   TOTAL KNEE ARTHROPLASTY Right 05/30/2015    Procedure: RIGHT TOTAL KNEE ARTHROPLASTY;  Surgeon: Ninetta Lights, MD;  Location: [facility];  Service: Orthopedics;  Laterality: Right;    SOCIAL HISTORY:  Social History   Socioeconomic History   Marital status: Widowed    Spouse name: Not on file   Number of children: Not on file   Years of education: Not on file   Highest education level: Not on file  Occupational History   Not on file  Tobacco Use   Smoking status: Former    Packs/day: 1.00    Years: 40.00    Pack years: 40.00    Types: Cigarettes    Quit date: 08/18/2004    Years since quitting: 17.4   Smokeless tobacco: Never  Vaping Use   Vaping Use: Never used  Substance and Sexual Activity   Alcohol use: No   Drug use: No   Sexual activity: Not Currently    Birth control/protection: Surgical  Other Topics Concern   Not on file  Social History Narrative   Not on file   Social Determinants of Health   Financial Resource Strain: Not on file  Food Insecurity: Not on file  Transportation Needs: Not on file  Physical Activity: Not on file  Stress: Not on file  Social Connections: Not on file  Intimate Partner Violence: Not on file    FAMILY HISTORY:  Family History  Problem Relation Age of Onset   Breast cancer Mother    Breast cancer Daughter    Colon cancer Neg Hx     CURRENT MEDICATIONS:  Current Outpatient Medications  Medication Sig Dispense Refill   acetaminophen (TYLENOL) 500 MG tablet Take 500 mg by mouth every 6 (six) hours as needed for moderate pain.     Atezolizumab (TECENTRIQ IV) Inject into the vein every 21 ( twenty-one) days.     Bevacizumab (AVASTIN IV) Inject into the vein every 21 ( twenty-one) days.     Calcium Carbonate-Vitamin D3 600-400 MG-UNIT TABS Take 1 tablet by mouth daily.     carvedilol (COREG) 6.25 MG tablet Take 6.25 mg by mouth 2 (two) times daily with a meal.     cholecalciferol (VITAMIN D3) 25 MCG (1000 UNIT) tablet Take 1,000 Units by mouth daily.     folic acid  (FOLVITE) 1 MG tablet Take 1 mg by mouth daily.     furosemide (LASIX) 20 MG tablet Take 20 mg by mouth daily as needed for edema.     gabapentin (NEURONTIN) 600 MG tablet Take 600 mg by mouth 3 (three) times daily as needed (pain).     Glucagon (GVOKE HYPOPEN 2-PACK) 1 MG/0.2ML SOAJ Inject 0.2 mLs into the skin as needed (Blood glucose less than 55). 0.4 mL 11   glucosamine-chondroitin 500-400 MG tablet Take 2 tablets by mouth daily.     insulin degludec (TRESIBA FLEXTOUCH) 100 UNIT/ML FlexTouch Pen Inject 40 Units into the skin at bedtime. 36 mL 0   lidocaine-prilocaine (EMLA) cream Apply to affected area once 30 g 3   loperamide (IMODIUM) 2 MG capsule Take 2 mg by mouth daily.     magnesium gluconate (MAGONATE) 500 MG tablet Take 500 mg by mouth daily.     methotrexate (RHEUMATREX) 2.5 MG tablet Take 20 mg by mouth every Sunday. Caution:Chemotherapy. Protect from light.     NOVOLOG MIX 70/30 FLEXPEN (70-30) 100 UNIT/ML FlexPen Inject 20-30 Units into the skin See admin instructions. Inject 30 units into the skin in the morning and inject 20 units in the evening  3   omeprazole (PRILOSEC) 40 MG capsule Take 40 mg by mouth daily.   3   prochlorperazine (COMPAZINE) 10 MG tablet Take 1 tablet (10 mg total) by mouth every 6 (six) hours as needed for nausea or vomiting. 30 tablet 3   pyridoxine (B-6) 100 MG tablet Take 100 mg by mouth daily.     rivaroxaban (XARELTO) 20 MG TABS tablet Take 1 tablet (20 mg total) by mouth daily with supper. 30 tablet 6   rOPINIRole (REQUIP) 1 MG tablet Take 1 mg by mouth 3 (three) times daily as needed (restless leg).     traMADol (ULTRAM) 50 MG tablet Take 1 tablet (50 mg total) by mouth every 6 (six) hours  as needed. 15 tablet 0   vitamin C (ASCORBIC ACID) 500 MG tablet Take 500 mg by mouth daily.     No current facility-administered medications for this visit.   Facility-Administered Medications Ordered in Other Visits  Medication Dose Route Frequency Provider  Last Rate Last Admin   0.9 %  sodium chloride infusion   Intravenous Continuous Holley Bouche, NP   Stopped at 09/14/17 1440    ALLERGIES:  Allergies  Allergen Reactions   Xeloda [Capecitabine] Other (See Comments)    Blisters and pain to skin to include feet and arms   Adhesive [Tape]     Adhesive tape and band-aids cause skin irritation   Neosporin Original [Bacitracin-Neomycin-Polymyxin] Other (See Comments)    blister   Sulfa Antibiotics Nausea Only and Other (See Comments)    Joint paint   Erythromycin Itching and Rash    burning    PHYSICAL EXAM:  Performance status (ECOG): 2 - Symptomatic, <50% confined to bed  There were no vitals filed for this visit. Wt Readings from Last 3 Encounters:  01/02/22 215 lb (97.5 kg)  12/26/21 216 lb 6.4 oz (98.2 kg)  12/19/21 214 lb (97.1 kg)   Physical Exam  LABORATORY DATA:  I have reviewed the labs as listed.     Latest Ref Rng & Units 12/26/2021    7:48 AM 12/19/2021   12:58 PM 05/17/2021   11:11 AM  CBC  WBC 4.0 - 10.5 K/uL 5.8   8.3   8.3    Hemoglobin 12.0 - 15.0 g/dL 9.6   10.1   14.6    Hematocrit 36.0 - 46.0 % 30.0   32.1   44.8    Platelets 150 - 400 K/uL 174   177   153        Latest Ref Rng & Units 12/26/2021    7:48 AM 12/19/2021   12:58 PM 10/15/2021   10:29 AM  CMP  Glucose 70 - 99 mg/dL 207   146     BUN 8 - 23 mg/dL 15   16     Creatinine 0.44 - 1.00 mg/dL 0.94   0.91   0.90    Sodium 135 - 145 mmol/L 135   136     Potassium 3.5 - 5.1 mmol/L 3.6   4.1     Chloride 98 - 111 mmol/L 104   106     CO2 22 - 32 mmol/L 26   26     Calcium 8.9 - 10.3 mg/dL 8.3   8.7     Total Protein 6.5 - 8.1 g/dL 5.9   6.4     Total Bilirubin 0.3 - 1.2 mg/dL 1.1   1.4     Alkaline Phos 38 - 126 U/L 74   81     AST 15 - 41 U/L 25   36     ALT 0 - 44 U/L 20   26       DIAGNOSTIC IMAGING:  I have independently reviewed the scans and discussed with the patient. DG Chest Port 1 View  Result Date: 01/08/2022 CLINICAL DATA:   Port catheter placement EXAM: PORTABLE CHEST 1 VIEW COMPARISON:  10/08/2016 FINDINGS: Cardiomegaly. Left chest port catheter, tip projecting over the mid superior vena cava. Mild pulmonary vascular prominence without overt edema or focal airspace opacity. The visualized skeletal structures are unremarkable. IMPRESSION: 1. Left chest port catheter, tip projecting over the mid superior vena cava. No pneumothorax. 2. Is this with  cardiomegaly with pulmonary vascular prominence, however without overt pulmonary edema or acute airspace opacity. Electronically Signed   By: Delanna Ahmadi M.D.   On: 01/08/2022 11:33   DG C-Arm 1-60 Min-No Report  Result Date: 01/08/2022 Fluoroscopy was utilized by the requesting physician.  No radiographic interpretation.   IR Radiologist Eval & Mgmt  Result Date: 12/17/2021 Please refer to notes tab for details about interventional procedure. (Op Note)    ASSESSMENT:  1.  Hepatocellular carcinoma: -MRI of the abdomen with Eovist on 04/21/2019 showed hypoenhancing lesion in the liver, which was not typical for [facility].  It was not significantly hypermetabolic on prior PET/CT.  This lesion has been gradually increasing since 10/08/2016. - AFP was normal at 3.9. -Biopsy on 05/11/2019 shows well to moderately differentiated hepatocellular carcinoma, steatohepatitic variant. -CT chest on 05/30/2019 was negative for metastatic disease. -Bland embolization of the hepatic lesion on 07/25/2019. -MRI of the abdomen with and without contrast on 09/21/2020 showed previous treated lesion in the central aspect of the liver measures 4.4 x 3.2 cm, previously 3.1 x 2.6 cm.  Hypervascular left liver lobe lesion measuring 1.3 x 1.1 cm.  6 mm hypervascular focus noted in the inferior aspect of the right lobe of the liver. -CTAP with and without contrast on 10/25/2020 showed left liver lesion measuring 1.8 x 1.4 cm, for inferior right liver lesion measuring 1 x 0.9 cm which are new.  Recurrent  hyperenhancing tumor at the treated tumor site in the central liver, measuring 6 x 5 cm with extension into the caudate lobe.  No evidence of metastatic disease in the chest. - Left lobe liver biopsy on 11/29/2020-well to moderately differentiated HCC.   2.  Stage IIa (CT3CN0) colon cancer: -Right hemicolectomy on 10/10/2016, grade 2 adenocarcinoma, positive LVSI, margins negative, 0/19 lymph nodes positive. -Because of positive LVSI, she was recommended Xeloda.  She could not tolerate more than 2 cycles.  She developed severe hand-foot skin reaction. -PET scan on 02/23/2018- for metastatic disease.  Liver mass was also not seen. -CEA on 03/29/2019 was 4.5. -CT AP on 03/29/2019 showed mass of the liver measuring 5.2 cm, previously 4.8 cm.   3.  Diarrhea: -She has watery diarrhea up to once per day.  She takes Imodium 1-2 tablets/day which helps.  She had this diarrhea since surgery.   4.  Unprovoked PE: -Incidental PE on CT chest in August 2018.  She was treated with Xarelto 20 mg for a year. -Dose was reduced to 10 mg daily in October 2019.  She is tolerating it without any problems.   PLAN:  1.  Hepatocellular carcinoma: - We reviewed MRI of the abdomen with and without contrast from 12/12/2021 which showed several new arterial phase enhancing lesions concerning for progressive malignancy.  Upper abdominal ascites and mesenteric/omental edema. - MRI of the brain with and without contrast did not show any acute intracranial process. - She was evaluated by Dr. Pascal Lux and was recommended against local regional therapy and was advised to consider systemic therapy. - She reports having dizziness for the past 10 years but has gotten worse in the last 1 month.  She fell backward on Easter Sunday cleaning litter box.  We will check an ammonia level. - We have reviewed her blood work today.  Last AFP was 891 on 11/19/2021. 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  She does not have any history of variceal  bleeding.  She did not have any recent EGD. - We talked about  combination therapy with Atezolizumab and bevacizumab.  We talked about side effects in detail. -We will likely start her on single agent Atezolizumab and request GI evaluation for EGD to rule out varices prior to starting Atezolizumab. - We will also have to optimize her high blood pressure which could get worse with Avastin. - We also talked about port placement.   2.  Stage IIa (CT3CN0) colon cancer: - CT AP on 10/15/2021 did not show any evidence of metastatic disease.  Last CEA was 4.7 on 10/10/2021.   3.  Unprovoked PE: - Continue Xarelto 20 mg daily.  No bleeding issues reported.   4.  Diarrhea: - Mild diarrhea since colon surgery stable.  Continue Imodium as needed.   Orders placed this encounter:  No orders of the defined types were placed in this encounter.    Derek Jack, MD [facility] [facility] [phone]   I, Thana Ates, am acting as a scribe for Dr. Derek Jack.  {Add Barista Statement}

## 2022-01-16 ENCOUNTER — Ambulatory Visit (HOSPITAL_COMMUNITY): Payer: Medicare Other | Admitting: Hematology

## 2022-01-16 ENCOUNTER — Other Ambulatory Visit (HOSPITAL_COMMUNITY): Payer: Medicare Other

## 2022-01-16 ENCOUNTER — Ambulatory Visit (HOSPITAL_COMMUNITY): Payer: Medicare Other

## 2022-01-16 DIAGNOSIS — C220 Liver cell carcinoma: Secondary | ICD-10-CM

## 2022-01-27 ENCOUNTER — Other Ambulatory Visit (HOSPITAL_COMMUNITY): Payer: Self-pay | Admitting: *Deleted

## 2022-01-27 MED ORDER — RIVAROXABAN 20 MG PO TABS: 20.0000 mg | ORAL_TABLET | Freq: Every day | ORAL | 6 refills | Status: DC

## 2022-01-29 ENCOUNTER — Ambulatory Visit (INDEPENDENT_AMBULATORY_CARE_PROVIDER_SITE_OTHER): Payer: Medicare Other | Admitting: *Deleted

## 2022-01-29 DIAGNOSIS — Z0000 Encounter for general adult medical examination without abnormal findings: Secondary | ICD-10-CM | POA: Diagnosis not present

## 2022-01-29 NOTE — Patient Instructions (Signed)
Wanda Curry , Thank you for taking time to come for your Medicare Wellness Visit. I appreciate your ongoing commitment to your health goals. Please review the following plan we discussed and let me know if I can assist you in the future.   Screening recommendations/referrals: Mammogram: completed Bone Density: completed Recommended yearly ophthalmology/optometry visit for glaucoma screening and checkup Recommended yearly dental visit for hygiene and checkup  Vaccinations: Influenza vaccine: completed Pneumococcal vaccine: completed Tdap vaccine: due now  Shingles vaccine: due now    Advanced directives: copy requested  Conditions/risks identified: hypertension, diabetes  Next appointment: 1 year    Preventive Care 29 Years and Older, Female Preventive care refers to lifestyle choices and visits with your health care provider that can promote health and wellness. What does preventive care include? A yearly physical exam. This is also called an annual well check. Dental exams once or twice a year. Routine eye exams. Ask your health care provider how often you should have your eyes checked. Personal lifestyle choices, including: Daily care of your teeth and gums. Regular physical activity. Eating a healthy diet. Avoiding tobacco and drug use. Limiting alcohol use. Practicing safe sex. Taking low-dose aspirin every day. Taking vitamin and mineral supplements as recommended by your health care provider. What happens during an annual well check? The services and screenings done by your health care provider during your annual well check will depend on your age, overall health, lifestyle risk factors, and family history of disease. Counseling  Your health care provider may ask you questions about your: Alcohol use. Tobacco use. Drug use. Emotional well-being. Home and relationship well-being. Sexual activity. Eating habits. History of falls. Memory and ability to understand  (cognition). Work and work Statistician. Reproductive health. Screening  You may have the following tests or measurements: Height, weight, and BMI. Blood pressure. Lipid and cholesterol levels. These may be checked every 5 years, or more frequently if you are over 37 years old. Skin check. Lung cancer screening. You may have this screening every year starting at age 67 if you have a 30-pack-year history of smoking and currently smoke or have quit within the past 15 years. Fecal occult blood test (FOBT) of the stool. You may have this test every year starting at age 62. Flexible sigmoidoscopy or colonoscopy. You may have a sigmoidoscopy every 5 years or a colonoscopy every 10 years starting at age 28. Hepatitis C blood test. Hepatitis B blood test. Sexually transmitted disease (STD) testing. Diabetes screening. This is done by checking your blood sugar (glucose) after you have not eaten for a while (fasting). You may have this done every 1-3 years. Bone density scan. This is done to screen for osteoporosis. You may have this done starting at age 27. Mammogram. This may be done every 1-2 years. Talk to your health care provider about how often you should have regular mammograms. Talk with your health care provider about your test results, treatment options, and if necessary, the need for more tests. Vaccines  Your health care provider may recommend certain vaccines, such as: Influenza vaccine. This is recommended every year. Tetanus, diphtheria, and acellular pertussis (Tdap, Td) vaccine. You may need a Td booster every 10 years. Zoster vaccine. You may need this after age 19. Pneumococcal 13-valent conjugate (PCV13) vaccine. One dose is recommended after age 29. Pneumococcal polysaccharide (PPSV23) vaccine. One dose is recommended after age 66. Talk to your health care provider about which screenings and vaccines you need and how often you need  them. This information is not intended to  replace advice given to you by your health care provider. Make sure you discuss any questions you have with your health care provider. Document Released: 08/31/2015 Document Revised: 04/23/2016 Document Reviewed: 06/05/2015 Elsevier Interactive Patient Education  2017 [place] Prevention in the Home Falls can cause injuries. They can happen to people of all ages. There are many things you can do to make your home safe and to help prevent falls. What can I do on the outside of my home? Regularly fix the edges of walkways and driveways and fix any cracks. Remove anything that might make you trip as you walk through a door, such as a raised step or threshold. Trim any bushes or trees on the path to your home. Use bright outdoor lighting. Clear any walking paths of anything that might make someone trip, such as rocks or tools. Regularly check to see if handrails are loose or broken. Make sure that both sides of any steps have handrails. Any raised decks and porches should have guardrails on the edges. Have any leaves, snow, or ice cleared regularly. Use sand or salt on walking paths during winter. Clean up any spills in your garage right away. This includes oil or grease spills. What can I do in the bathroom? Use night lights. Install grab bars by the toilet and in the tub and shower. Do not use towel bars as grab bars. Use non-skid mats or decals in the tub or shower. If you need to sit down in the shower, use a plastic, non-slip stool. Keep the floor dry. Clean up any water that spills on the floor as soon as it happens. Remove soap buildup in the tub or shower regularly. Attach bath mats securely with double-sided non-slip rug tape. Do not have throw rugs and other things on the floor that can make you trip. What can I do in the bedroom? Use night lights. Make sure that you have a light by your bed that is easy to reach. Do not use any sheets or blankets that are too big for  your bed. They should not hang down onto the floor. Have a firm chair that has side arms. You can use this for support while you get dressed. Do not have throw rugs and other things on the floor that can make you trip. What can I do in the kitchen? Clean up any spills right away. Avoid walking on wet floors. Keep items that you use a lot in easy-to-reach places. If you need to reach something above you, use a strong step stool that has a grab bar. Keep electrical cords out of the way. Do not use floor polish or wax that makes floors slippery. If you must use wax, use non-skid floor wax. Do not have throw rugs and other things on the floor that can make you trip. What can I do with my stairs? Do not leave any items on the stairs. Make sure that there are handrails on both sides of the stairs and use them. Fix handrails that are broken or loose. Make sure that handrails are as long as the stairways. Check any carpeting to make sure that it is firmly attached to the stairs. Fix any carpet that is loose or worn. Avoid having throw rugs at the top or bottom of the stairs. If you do have throw rugs, attach them to the floor with carpet tape. Make sure that you have a light switch at  the top of the stairs and the bottom of the stairs. If you do not have them, ask someone to add them for you. What else can I do to help prevent falls? Wear shoes that: Do not have high heels. Have rubber bottoms. Are comfortable and fit you well. Are closed at the toe. Do not wear sandals. If you use a stepladder: Make sure that it is fully opened. Do not climb a closed stepladder. Make sure that both sides of the stepladder are locked into place. Ask someone to hold it for you, if possible. Clearly mark and make sure that you can see: Any grab bars or handrails. First and last steps. Where the edge of each step is. Use tools that help you move around (mobility aids) if they are needed. These  include: Canes. Walkers. Scooters. Crutches. Turn on the lights when you go into a dark area. Replace any light bulbs as soon as they burn out. Set up your furniture so you have a clear path. Avoid moving your furniture around. If any of your floors are uneven, fix them. If there are any pets around you, be aware of where they are. Review your medicines with your doctor. Some medicines can make you feel dizzy. This can increase your chance of falling. Ask your doctor what other things that you can do to help prevent falls. This information is not intended to replace advice given to you by your health care provider. Make sure you discuss any questions you have with your health care provider. Document Released: 05/31/2009 Document Revised: 01/10/2016 Document Reviewed: 09/08/2014 Elsevier Interactive Patient Education  2017 [employer].

## 2022-01-29 NOTE — Progress Notes (Signed)
Subjective:   Wanda Curry is a 76 y.o. female who presents for Medicare Annual (Subsequent) preventive examination.  I connected with  Wanda Curry on 01/29/22 by a audio enabled telemedicine application and verified that I am speaking with the correct person using two identifiers.  Patient Location: Home  Provider Location: Office/Clinic  I discussed the limitations of evaluation and management by telemedicine. The patient expressed understanding and agreed to proceed.   Review of Systems     Wanda Curry , Thank you for taking time to come for your Medicare Wellness Visit. I appreciate your ongoing commitment to your health goals. Please review the following plan we discussed and let me know if I can assist you in the future.   These are the goals we discussed:  Goals   None     This is a list of the screening recommended for you and due dates:  Health Maintenance  Topic Date Due   Complete foot exam   Never done   Urine Protein Check  Never done   Tetanus Vaccine  Never done   Zoster (Shingles) Vaccine (1 of 2) Never done   COVID-19 Vaccine (4 - Booster for Moderna series) 09/01/2020   Hemoglobin A1C  05/31/2021   Flu Shot  03/18/2022   Eye exam for diabetics  11/13/2022   Pneumonia Vaccine  Completed   DEXA scan (bone density measurement)  Completed   Hepatitis C Screening: USPSTF Recommendation to screen - Ages 78-79 yo.  Completed   HPV Vaccine  Aged Out   Colon Cancer Screening  Discontinued          Objective:    There were no vitals filed for this visit. There is no height or weight on file to calculate BMI.     12/26/2021    9:27 AM 12/19/2021   11:41 AM 11/26/2021   11:14 AM 09/10/2021    1:50 PM 04/11/2021   11:13 AM 01/02/2021    8:17 AM 12/11/2020    2:11 PM  Advanced Directives  Does Patient Have a Medical Advance Directive? No No No No No No No  Would patient like information on creating a medical advance directive? No - Patient  declined No - Patient declined No - Patient declined  No - Patient declined No - Patient declined No - Patient declined    Current Medications (verified) Outpatient Encounter Medications as of 01/29/2022  Medication Sig   acetaminophen (TYLENOL) 500 MG tablet Take 500 mg by mouth every 6 (six) hours as needed for moderate pain.   Atezolizumab (TECENTRIQ IV) Inject into the vein every 21 ( twenty-one) days.   Bevacizumab (AVASTIN IV) Inject into the vein every 21 ( twenty-one) days.   Calcium Carbonate-Vitamin D3 600-400 MG-UNIT TABS Take 1 tablet by mouth daily.   carvedilol (COREG) 6.25 MG tablet Take 6.25 mg by mouth 2 (two) times daily with a meal.   cholecalciferol (VITAMIN D3) 25 MCG (1000 UNIT) tablet Take 1,000 Units by mouth daily.   folic acid (FOLVITE) 1 MG tablet Take 1 mg by mouth daily.   furosemide (LASIX) 20 MG tablet Take 20 mg by mouth daily as needed for edema.   gabapentin (NEURONTIN) 600 MG tablet Take 600 mg by mouth 3 (three) times daily as needed (pain).   Glucagon (GVOKE HYPOPEN 2-PACK) 1 MG/0.2ML SOAJ Inject 0.2 mLs into the skin as needed (Blood glucose less than 55).   glucosamine-chondroitin 500-400 MG tablet Take 2 tablets by mouth  daily.   insulin degludec (TRESIBA FLEXTOUCH) 100 UNIT/ML FlexTouch Pen Inject 40 Units into the skin at bedtime.   lidocaine-prilocaine (EMLA) cream Apply to affected area once   loperamide (IMODIUM) 2 MG capsule Take 2 mg by mouth daily.   magnesium gluconate (MAGONATE) 500 MG tablet Take 500 mg by mouth daily.   methotrexate (RHEUMATREX) 2.5 MG tablet Take 20 mg by mouth every Sunday. Caution:Chemotherapy. Protect from light.   NOVOLOG MIX 70/30 FLEXPEN (70-30) 100 UNIT/ML FlexPen Inject 20-30 Units into the skin See admin instructions. Inject 30 units into the skin in the morning and inject 20 units in the evening   omeprazole (PRILOSEC) 40 MG capsule Take 40 mg by mouth daily.    prochlorperazine (COMPAZINE) 10 MG tablet Take 1  tablet (10 mg total) by mouth every 6 (six) hours as needed for nausea or vomiting.   pyridoxine (B-6) 100 MG tablet Take 100 mg by mouth daily.   rivaroxaban (XARELTO) 20 MG TABS tablet Take 1 tablet (20 mg total) by mouth daily with supper.   rOPINIRole (REQUIP) 1 MG tablet Take 1 mg by mouth 3 (three) times daily as needed (restless leg).   traMADol (ULTRAM) 50 MG tablet Take 1 tablet (50 mg total) by mouth every 6 (six) hours as needed.   vitamin C (ASCORBIC ACID) 500 MG tablet Take 500 mg by mouth daily.   Facility-Administered Encounter Medications as of 01/29/2022  Medication   0.9 %  sodium chloride infusion    Allergies (verified) Xeloda [capecitabine], Adhesive [tape], Neosporin original [bacitracin-neomycin-polymyxin], Sulfa antibiotics, and Erythromycin   History: Past Medical History:  Diagnosis Date   Acute pulmonary embolism ([place]) 04/15/2017   Anemia    Arthritis    RA   Cancer (HCC)    skin cancer, colon, liver   Depression    Diabetes mellitus without complication (HCC)    Dyspnea    Fibromyalgia    GERD (gastroesophageal reflux disease)    GI bleed 08/12/2017   Heart murmur    ECHO scheduled 05-25-2015   Hyperlipidemia    Neuropathy    PONV (postoperative nausea and vomiting)    Puncture wound of great toe    Right leg DVT ([place]) 12/01/2020   Past Surgical History:  Procedure Laterality Date   ABDOMINAL HYSTERECTOMY     BIOPSY  09/23/2016   Procedure: BIOPSY;  Surgeon: Danie Binder, MD;  [facility];  Service: Endoscopy;;  hepatic flexure mass   CHOLECYSTECTOMY     COLONOSCOPY N/A 08/13/2017   Procedure: COLONOSCOPY;  Surgeon: Daneil Dolin, MD;  [facility];  Service: Endoscopy;  Laterality: N/A;   COLONOSCOPY WITH PROPOFOL N/A 09/23/2016   Procedure: COLONOSCOPY WITH PROPOFOL;  Surgeon: Danie Binder, MD;  [facility];  Service: Endoscopy;  Laterality: N/A;  7:30 am   HARDWARE REMOVAL Right 05/30/2015   Procedure:  HARDWARE REMOVAL;  Surgeon: Ninetta Lights, MD;  Location: [place];  Service: Orthopedics;  Laterality: Right;   IR ANGIOGRAM SELECTIVE EACH ADDITIONAL VESSEL  07/25/2019   IR ANGIOGRAM SELECTIVE EACH ADDITIONAL VESSEL  07/25/2019   IR ANGIOGRAM SELECTIVE EACH ADDITIONAL VESSEL  01/02/2021   IR ANGIOGRAM SELECTIVE EACH ADDITIONAL VESSEL  01/02/2021   IR ANGIOGRAM SELECTIVE EACH ADDITIONAL VESSEL  01/02/2021   IR ANGIOGRAM SELECTIVE EACH ADDITIONAL VESSEL  01/18/2021   IR ANGIOGRAM SELECTIVE EACH ADDITIONAL VESSEL  02/15/2021   IR ANGIOGRAM SELECTIVE EACH ADDITIONAL VESSEL  02/15/2021   IR ANGIOGRAM  VISCERAL SELECTIVE  07/25/2019   IR ANGIOGRAM VISCERAL SELECTIVE  07/25/2019   IR ANGIOGRAM VISCERAL SELECTIVE  01/02/2021   IR ANGIOGRAM VISCERAL SELECTIVE  01/02/2021   IR ANGIOGRAM VISCERAL SELECTIVE  01/18/2021   IR ANGIOGRAM VISCERAL SELECTIVE  02/15/2021   IR EMBO ARTERIAL NOT HEMORR HEMANG INC GUIDE ROADMAPPING  01/02/2021   IR EMBO TUMOR ORGAN ISCHEMIA INFARCT INC GUIDE ROADMAPPING  07/25/2019   IR EMBO TUMOR ORGAN ISCHEMIA INFARCT INC GUIDE ROADMAPPING  01/18/2021   IR EMBO TUMOR ORGAN ISCHEMIA INFARCT INC GUIDE ROADMAPPING  02/15/2021   IR RADIOLOGIST EVAL & MGMT  06/09/2019   IR RADIOLOGIST EVAL & MGMT  07/07/2019   IR RADIOLOGIST EVAL & MGMT  01/05/2020   IR RADIOLOGIST EVAL & MGMT  04/12/2020   IR RADIOLOGIST EVAL & MGMT  09/27/2020   IR RADIOLOGIST EVAL & MGMT  10/31/2020   IR RADIOLOGIST EVAL & MGMT  12/12/2020   IR RADIOLOGIST EVAL & MGMT  04/04/2021   IR RADIOLOGIST EVAL & MGMT  05/23/2021   IR RADIOLOGIST EVAL & MGMT  12/17/2021   IR US GUIDE VASC ACCESS RIGHT  07/25/2019   IR US GUIDE VASC ACCESS RIGHT  01/02/2021   IR US GUIDE VASC ACCESS RIGHT  01/18/2021   IR US GUIDE VASC ACCESS RIGHT  02/15/2021   PARTIAL COLECTOMY N/A 10/10/2016   Procedure: PARTIAL COLECTOMY;  Surgeon: Aviva Signs, MD;  Location[facility];  Service: General;  Laterality: N/A;   planter warts Bilateral    both feet   POLYPECTOMY   09/23/2016   Procedure: POLYPECTOMY;  Surgeon: Danie Binder, MD;  [facility];  Service: Endoscopy;;  transverse colon polyps times 2, rectal polyp   PORTACATH PLACEMENT Left 01/08/2022   Procedure: INSERTION PORT-A-CATH;  Surgeon: Aviva Signs, MD;  Location: [facility];  Service: General;  Laterality: Left;   skin grafts     due to planter warts   TOTAL KNEE ARTHROPLASTY Right 05/30/2015   Procedure: RIGHT TOTAL KNEE ARTHROPLASTY;  Surgeon: Ninetta Lights, MD;  Location: [place];  Service: Orthopedics;  Laterality: Right;   Family History  Problem Relation Age of Onset   Breast cancer Mother    Breast cancer Daughter    Colon cancer Neg Hx    Social History   Socioeconomic History   Marital status: Widowed    Spouse name: Not on file   Number of children: Not on file   Years of education: Not on file   Highest education level: Not on file  Occupational History   Not on file  Tobacco Use   Smoking status: Former    Packs/day: 1.00    Years: 40.00    Total pack years: 40.00    Types: Cigarettes    Quit date: 08/18/2004    Years since quitting: 17.4   Smokeless tobacco: Never  Vaping Use   Vaping Use: Never used  Substance and Sexual Activity   Alcohol use: No   Drug use: No   Sexual activity: Not Currently    Birth control/protection: Surgical  Other Topics Concern   Not on file  Social History Narrative   Not on file   Social Determinants of Health   Financial Resource Strain: Not on file  Food Insecurity: Not on file  Transportation Needs: Not on file  Physical Activity: Not on file  Stress: Not on file  Social Connections: Not on file    Tobacco Counseling Counseling given: Not Answered  Clinical Intake:                 Diabetic?Nutrition Risk Assessment:  Has the patient had any N/V/D within the last 2 months?  No Does the patient have any non-healing wounds?  No  Has the patient had any unintentional weight loss or weight gain?   No   Diabetes:  Is the patient diabetic?  Yes  If diabetic, was a CBG obtained today?  No  Did the patient bring in their glucometer from home?  No  How often do you monitor your CBG's? Once a day.   Financial Strains and Diabetes Management:  Are you having any financial strains with the device, your supplies or your medication? No .  Does the patient want to be seen by Chronic Care Management for management of their diabetes?  No  Would the patient like to be referred to a Nutritionist or for Diabetic Management?  No   Diabetic Exams:  Diabetic Eye Exam: Completed 11-12-21.   Diabetic Foot Exam: Pt has been advised about the importance in completing this exam. Pt is scheduled for diabetic foot exam on next appointment with provider.           Activities of Daily Living    02/15/2021    9:00 AM  In your present state of health, do you have any difficulty performing the following activities:  Hearing? 0  Vision? 0  Difficulty concentrating or making decisions? 0  Walking or climbing stairs? 1  Dressing or bathing? 0    Patient Care Team: Lindell Spar, MD as PCP - General (Internal Medicine) Danie Binder, MD (Inactive) as Consulting Physician (Gastroenterology) Derek Jack, MD as Medical Oncologist (Medical Oncology)  Indicate any recent Medical Services you may have received from other than [facility] providers in the past year (date may be approximate).     Assessment:   This is a routine wellness examination for [place].  Hearing/Vision screen No results found.  Dietary issues and exercise activities discussed:     Goals Addressed   None   Depression Screen    11/08/2021    8:13 AM  PHQ 2/9 Scores  PHQ - 2 Score 0    Fall Risk    11/08/2021    8:13 AM  [place] in the past year? 0  Number falls in past yr: 0  Injury with Fall? 0  Risk for fall due to : No Fall Risks  Follow up Falls evaluation completed    [place]:  Any stairs in or around the home? Yes  If so, are there any without handrails? No  Home free of loose throw rugs in walkways, pet beds, electrical cords, etc? Yes  Adequate lighting in your home to reduce risk of falls? Yes   ASSISTIVE DEVICES UTILIZED TO PREVENT FALLS:  Life alert? No  Use of a cane, walker or w/c? Yes  Grab bars in the bathroom? No  Shower chair or bench in shower? Yes  Elevated toilet seat or a handicapped toilet? Yes   Cognitive Function:        Immunizations Immunization History  Administered Date(s) Administered   Fluad Quad(high Dose 65+) 06/02/2019   Influenza,inj,Quad PF,6+ Mos 05/31/2015   Influenza-Unspecified 06/09/2011, 08/18/2012, 08/18/2013, 05/18/2016, 07/21/2016, 06/02/2017, 05/21/2018, 07/09/2021   Moderna Sars-Covid-2 Vaccination 11/30/2019, 12/28/2019, 07/07/2020   Pneumococcal Conjugate-13 12/05/2013   Pneumococcal Polysaccharide-23 06/13/2011   Zoster, Live 08/19/2011, 10/01/2011  TDAP status: Due, Education has been provided regarding the importance of this vaccine. Advised may receive this vaccine at local pharmacy or Health Dept. Aware to provide a copy of the vaccination record if obtained from local pharmacy or Health Dept. Verbalized acceptance and understanding.  Flu Vaccine status: Up to date  Pneumococcal vaccine status: Up to date  Covid-19 vaccine status: Completed vaccines  Qualifies for Shingles Vaccine? Yes   Zostavax completed No   Shingrix Completed?: No.    Education has been provided regarding the importance of this vaccine. Patient has been advised to call insurance company to determine out of pocket expense if they have not yet received this vaccine. Advised may also receive vaccine at local pharmacy or Health Dept. Verbalized acceptance and understanding.  Screening Tests Health Maintenance  Topic Date Due   FOOT EXAM  Never done   URINE MICROALBUMIN  Never done    TETANUS/TDAP  Never done   Zoster Vaccines- Shingrix (1 of 2) Never done   COVID-19 Vaccine (4 - Booster for Moderna series) 09/01/2020   HEMOGLOBIN A1C  05/31/2021   INFLUENZA VACCINE  03/18/2022   OPHTHALMOLOGY EXAM  11/13/2022   Pneumonia Vaccine 27+ Years old  Completed   DEXA SCAN  Completed   Hepatitis C Screening  Completed   HPV VACCINES  Aged Out   COLONOSCOPY (Pts 45-31yr Insurance coverage will need to be confirmed)  Discontinued    Health Maintenance  Health Maintenance Due  Topic Date Due   FOOT EXAM  Never done   URINE MICROALBUMIN  Never done   TETANUS/TDAP  Never done   Zoster Vaccines- Shingrix (1 of 2) Never done   COVID-19 Vaccine (4 - Booster for Moderna series) 09/01/2020   HEMOGLOBIN A1C  05/31/2021    Colorectal cancer screening: No longer required.   Mammogram Status: Completed 11-25-21  Bone Density status: Completed 11-25-21. Results reflect: Bone density results: OSTEOPOROSIS. Repeat every 3 years.  Lung Cancer Screening: (Low Dose CT Chest recommended if Age 78-80years, 30 pack-year currently smoking OR have quit w/in 15years.) does not qualify.   Lung Cancer Screening Referral: NA  Additional Screening:  Hepatitis C Screening: does qualify; Completed 11-05-17  Vision Screening: Recommended annual ophthalmology exams for early detection of glaucoma and other disorders of the eye. Is the patient up to date with their annual eye exam?  Yes  Who is the provider or what is the name of the office in which the patient attends annual eye exams? [facility] is not established with a provider, would they like to be referred to a provider to establish care? No .   Dental Screening: Recommended annual dental exams for proper oral hygiene  Community Resource Referral / Chronic Care Management: CRR required this visit?  No   CCM required this visit?  No      Plan:     I have personally reviewed and noted the following in the  patient's chart:   Medical and social history Use of alcohol, tobacco or illicit drugs  Current medications and supplements including opioid prescriptions.  Functional ability and status Nutritional status Physical activity Advanced directives List of other physicians Hospitalizations, surgeries, and ER visits in previous 12 months Vitals Screenings to include cognitive, depression, and falls Referrals and appointments  In addition, I have reviewed and discussed with patient certain preventive protocols, quality metrics, and best practice recommendations. A written personalized care plan for preventive services as well as general  preventive health recommendations were provided to patient.     Shelda Altes, CMA   01/29/2022   Nurse Notes:  Ms. Ege , Thank you for taking time to come for your Medicare Wellness Visit. I appreciate your ongoing commitment to your health goals. Please review the following plan we discussed and let me know if I can assist you in the future.   These are the goals we discussed:  Goals   None     This is a list of the screening recommended for you and due dates:  Health Maintenance  Topic Date Due   Complete foot exam   Never done   Urine Protein Check  Never done   Tetanus Vaccine  Never done   Zoster (Shingles) Vaccine (1 of 2) Never done   COVID-19 Vaccine (4 - Booster for Moderna series) 09/01/2020   Hemoglobin A1C  05/31/2021   Flu Shot  03/18/2022   Eye exam for diabetics  11/13/2022   Pneumonia Vaccine  Completed   DEXA scan (bone density measurement)  Completed   Hepatitis C Screening: USPSTF Recommendation to screen - Ages 71-79 yo.  Completed   HPV Vaccine  Aged Out   Colon Cancer Screening  Discontinued

## 2022-01-30 ENCOUNTER — Inpatient Hospital Stay (HOSPITAL_COMMUNITY): Payer: Medicare Other | Admitting: Hematology

## 2022-01-30 ENCOUNTER — Inpatient Hospital Stay (HOSPITAL_COMMUNITY): Payer: Medicare Other

## 2022-01-30 ENCOUNTER — Telehealth: Payer: Self-pay | Admitting: *Deleted

## 2022-01-30 ENCOUNTER — Inpatient Hospital Stay (HOSPITAL_COMMUNITY): Payer: Medicare Other | Attending: Hematology

## 2022-01-30 ENCOUNTER — Telehealth: Payer: Self-pay

## 2022-01-30 VITALS — BP 136/50 | HR 89 | Temp 97.9°F | Resp 20

## 2022-01-30 DIAGNOSIS — E876 Hypokalemia: Secondary | ICD-10-CM | POA: Insufficient documentation

## 2022-01-30 DIAGNOSIS — C182 Malignant neoplasm of ascending colon: Secondary | ICD-10-CM | POA: Diagnosis not present

## 2022-01-30 DIAGNOSIS — Z79899 Other long term (current) drug therapy: Secondary | ICD-10-CM | POA: Diagnosis not present

## 2022-01-30 DIAGNOSIS — C220 Liver cell carcinoma: Secondary | ICD-10-CM

## 2022-01-30 DIAGNOSIS — D649 Anemia, unspecified: Secondary | ICD-10-CM

## 2022-01-30 DIAGNOSIS — Z5112 Encounter for antineoplastic immunotherapy: Secondary | ICD-10-CM | POA: Insufficient documentation

## 2022-01-30 DIAGNOSIS — D630 Anemia in neoplastic disease: Secondary | ICD-10-CM

## 2022-01-30 LAB — CBC WITH DIFFERENTIAL/PLATELET
Abs Immature Granulocytes: 0.02 10*3/uL (ref 0.00–0.07)
Basophils Absolute: 0 10*3/uL (ref 0.0–0.1)
Basophils Relative: 0 %
Eosinophils Absolute: 0.3 10*3/uL (ref 0.0–0.5)
Eosinophils Relative: 4 %
HCT: 24.6 % — ABNORMAL LOW (ref 36.0–46.0)
Hemoglobin: 7.7 g/dL — ABNORMAL LOW (ref 12.0–15.0)
Immature Granulocytes: 0 %
Lymphocytes Relative: 14 %
Lymphs Abs: 0.8 10*3/uL (ref 0.7–4.0)
MCH: 28.6 pg (ref 26.0–34.0)
MCHC: 31.3 g/dL (ref 30.0–36.0)
MCV: 91.4 fL (ref 80.0–100.0)
Monocytes Absolute: 0.2 10*3/uL (ref 0.1–1.0)
Monocytes Relative: 4 %
Neutro Abs: 4.5 10*3/uL (ref 1.7–7.7)
Neutrophils Relative %: 78 %
Platelets: 173 10*3/uL (ref 150–400)
RBC: 2.69 MIL/uL — ABNORMAL LOW (ref 3.87–5.11)
RDW: 16.4 % — ABNORMAL HIGH (ref 11.5–15.5)
WBC: 5.8 10*3/uL (ref 4.0–10.5)
nRBC: 0 % (ref 0.0–0.2)

## 2022-01-30 LAB — IRON AND TIBC
Iron: 23 ug/dL — ABNORMAL LOW (ref 28–170)
Saturation Ratios: 9 % — ABNORMAL LOW (ref 10.4–31.8)
TIBC: 243 ug/dL — ABNORMAL LOW (ref 250–450)
UIBC: 220 ug/dL

## 2022-01-30 LAB — COMPREHENSIVE METABOLIC PANEL
ALT: 28 U/L (ref 0–44)
AST: 31 U/L (ref 15–41)
Albumin: 2.1 g/dL — ABNORMAL LOW (ref 3.5–5.0)
Alkaline Phosphatase: 73 U/L (ref 38–126)
Anion gap: 5 (ref 5–15)
BUN: 17 mg/dL (ref 8–23)
CO2: 25 mmol/L (ref 22–32)
Calcium: 8 mg/dL — ABNORMAL LOW (ref 8.9–10.3)
Chloride: 106 mmol/L (ref 98–111)
Creatinine, Ser: 0.91 mg/dL (ref 0.44–1.00)
GFR, Estimated: >60 mL/min (ref >60)
Glucose, Bld: 242 mg/dL — ABNORMAL HIGH (ref 70–99)
Potassium: 3.5 mmol/L (ref 3.5–5.1)
Sodium: 136 mmol/L (ref 135–145)
Total Bilirubin: 1.2 mg/dL (ref 0.3–1.2)
Total Protein: 5.3 g/dL — ABNORMAL LOW (ref 6.5–8.1)

## 2022-01-30 LAB — VITAMIN B12: Vitamin B-12: 450 pg/mL (ref 180–914)

## 2022-01-30 LAB — FERRITIN: Ferritin: 42 ng/mL (ref 11–307)

## 2022-01-30 LAB — MAGNESIUM: Magnesium: 1.3 mg/dL — ABNORMAL LOW (ref 1.7–2.4)

## 2022-01-30 LAB — FOLATE: Folate: 14.8 ng/mL (ref >5.9)

## 2022-01-30 MED ORDER — MAGNESIUM OXIDE -MG SUPPLEMENT 400 (240 MG) MG PO TABS: 400.0000 mg | ORAL_TABLET | Freq: Three times a day (TID) | ORAL | 3 refills | Status: DC

## 2022-01-30 MED ORDER — SODIUM CHLORIDE 0.9 % IV SOLN
1200.0000 mg | Freq: Once | INTRAVENOUS | Status: AC
  Administered 2022-01-30: 1200 mg via INTRAVENOUS
  Filled 2022-01-30: qty 20

## 2022-01-30 MED ORDER — MAGNESIUM SULFATE 4 GM/100ML IV SOLN: 4.0000 g | Freq: Once | INTRAVENOUS | Status: DC

## 2022-01-30 MED ORDER — SODIUM CHLORIDE 0.9% FLUSH
10.0000 mL | INTRAVENOUS | Status: DC | PRN
  Administered 2022-01-30: 10 mL

## 2022-01-30 MED ORDER — MAGNESIUM SULFATE 2 GM/50ML IV SOLN
2.0000 g | INTRAVENOUS | Status: AC
  Administered 2022-01-30 (×2): 2 g via INTRAVENOUS
  Filled 2022-01-30 (×2): qty 50

## 2022-01-30 MED ORDER — HEPARIN SOD (PORK) LOCK FLUSH 100 UNIT/ML IV SOLN
500.0000 [IU] | Freq: Once | INTRAVENOUS | Status: AC | PRN
  Administered 2022-01-30: 500 [IU]

## 2022-01-30 MED ORDER — SODIUM CHLORIDE 0.9 % IV SOLN: Freq: Once | INTRAVENOUS | Status: AC

## 2022-01-30 NOTE — Telephone Encounter (Signed)
Has been handled by Gloriajean Dell, CMA.

## 2022-01-30 NOTE — Telephone Encounter (Signed)
Gastroenterology Pre-Procedure Review  Request Date: Requesting Physician:   PATIENT REVIEW QUESTIONS: The patient responded to the following health history questions as indicated:    1. Diabetes Melitis: yes (Type 2) 2. Joint replacements in the past 12 months: no 3. Major health problems in the past 3 months: yes (diagnosed with liver cancer) 4. Has an artificial valve or MVP: no 5. Has a defibrillator: no 6. Has been advised in past to take antibiotics in advance of a procedure like teeth cleaning: no 7. Family history of colon cancer: no  8. Alcohol Use: no 9. Illicit drug Use: no 10. History of sleep apnea: no  11. History of coronary artery or other vascular stents placed within the last 12 months: no 12. History of any prior anesthesia complications: no 13. Estimated body mass index is 36.86 kg/m as calculated from the following:   Height as of an earlier encounter on 01/30/22: 5' 5.35" (1.66 m).   Weight as of an earlier encounter on 01/30/22: 223 lb 14.4 oz (101.6 kg).      MEDICATIONS & ALLERGIES:    Patient reports the following regarding taking any blood thinners:   Plavix? no Aspirin? no Coumadin? no Brilinta? no Xarelto? yes (for blood clots in past) Eliquis? no Pradaxa? no Savaysa? no Effient? no  Patient confirms/reports the following medications:  Current Outpatient Medications  Medication Sig Dispense Refill   acetaminophen (TYLENOL) 500 MG tablet Take 500 mg by mouth every 6 (six) hours as needed for moderate pain.     Atezolizumab (TECENTRIQ IV) Inject into the vein every 21 ( twenty-one) days.     Bevacizumab (AVASTIN IV) Inject into the vein every 21 ( twenty-one) days.     Calcium Carbonate-Vitamin D3 600-400 MG-UNIT TABS Take 1 tablet by mouth daily.     carvedilol (COREG) 6.25 MG tablet Take 6.25 mg by mouth 2 (two) times daily with a meal.     cholecalciferol (VITAMIN D3) 25 MCG (1000 UNIT) tablet Take 1,000 Units by mouth daily.     folic acid  (FOLVITE) 1 MG tablet Take 1 mg by mouth daily.     furosemide (LASIX) 20 MG tablet Take 20 mg by mouth daily as needed for edema.     gabapentin (NEURONTIN) 600 MG tablet Take 600 mg by mouth 3 (three) times daily as needed (pain).     Glucagon (GVOKE HYPOPEN 2-PACK) 1 MG/0.2ML SOAJ Inject 0.2 mLs into the skin as needed (Blood glucose less than 55). 0.4 mL 11   glucosamine-chondroitin 500-400 MG tablet Take 2 tablets by mouth daily.     insulin degludec (TRESIBA FLEXTOUCH) 100 UNIT/ML FlexTouch Pen Inject 40 Units into the skin at bedtime. 36 mL 0   lidocaine-prilocaine (EMLA) cream Apply to affected area once 30 g 3   loperamide (IMODIUM) 2 MG capsule Take 2 mg by mouth daily.     magnesium oxide (MAG-OX) 400 (240 Mg) MG tablet Take 1 tablet (400 mg total) by mouth 3 (three) times daily. 90 tablet 3   methotrexate (RHEUMATREX) 2.5 MG tablet Take 20 mg by mouth every Sunday. Caution:Chemotherapy. Protect from light.     NOVOLOG MIX 70/30 FLEXPEN (70-30) 100 UNIT/ML FlexPen Inject 20-30 Units into the skin See admin instructions. Inject 30 units into the skin in the morning and inject 20 units in the evening  3   omeprazole (PRILOSEC) 40 MG capsule Take 40 mg by mouth daily.   3   prochlorperazine (COMPAZINE) 10 MG tablet Take 1  tablet (10 mg total) by mouth every 6 (six) hours as needed for nausea or vomiting. 30 tablet 3   pyridoxine (B-6) 100 MG tablet Take 100 mg by mouth daily.     rivaroxaban (XARELTO) 20 MG TABS tablet Take 1 tablet (20 mg total) by mouth daily with supper. 30 tablet 6   rOPINIRole (REQUIP) 1 MG tablet Take 1 mg by mouth 3 (three) times daily as needed (restless leg).     traMADol (ULTRAM) 50 MG tablet Take 1 tablet (50 mg total) by mouth every 6 (six) hours as needed. 15 tablet 0   vitamin C (ASCORBIC ACID) 500 MG tablet Take 500 mg by mouth daily.     No current facility-administered medications for this visit.   Facility-Administered Medications Ordered in Other  Visits  Medication Dose Route Frequency Provider Last Rate Last Admin   0.9 %  sodium chloride infusion   Intravenous Continuous Holley Bouche, NP   Stopped at 09/14/17 1440   atezolizumab (TECENTRIQ) 1,200 mg in sodium chloride 0.9 % 250 mL chemo infusion  1,200 mg Intravenous Once Derek Jack, MD       heparin lock flush 100 unit/mL  500 Units Intracatheter Once PRN Derek Jack, MD       magnesium sulfate IVPB 2 g 50 mL  2 g Intravenous Q1 Hr x 2 Derek Jack, MD       sodium chloride flush (NS) 0.9 % injection 10 mL  10 mL Intracatheter PRN Derek Jack, MD        Patient confirms/reports the following allergies:  Allergies  Allergen Reactions   Xeloda [Capecitabine] Other (See Comments)    Blisters and pain to skin to include feet and arms   Adhesive [Tape]     Adhesive tape and band-aids cause skin irritation   Neosporin Original [Bacitracin-Neomycin-Polymyxin] Other (See Comments)    blister   Sulfa Antibiotics Nausea Only and Other (See Comments)    Joint paint   Erythromycin Itching and Rash    burning     This Gastroenterology Pre-Precedure Review Form is being routed to the following provider(s): R. Garfield Cornea, MD

## 2022-01-30 NOTE — Patient Instructions (Signed)
[place] at [facility] Discharge Instructions   You were seen and examined today by Dr. Delton Coombes.  He reviewed your lab work. Your magnesium is low. We will give you IV magnesium here in the clinic. Dr. Raliegh Ip also sent a prescription for magnesium to your pharmacy. Take 3 times a day.  We will make a referral to Dr. Roseanne Kaufman office to perform and endoscopy to check for esophageal varicies - these are blood pockets that may be present in your esophagus. Once this is done we can start you on the Avastin.   Increase lasix to 2 pills a day for a couple of days to help with the swelling.   Start drinking Premier Protein 2 a day to increase your protein intake. This will help with your swelling as well.   We will check your iron today.  We will proceed with your treatment today.  Return as scheduled.    Thank you for choosing [place] at [facility] to provide your oncology and hematology care.  To afford each patient quality time with our provider, please arrive at least 15 minutes before your scheduled appointment time.   If you have a lab appointment with the [facility] please come in thru the Main Entrance and check in at the main information desk.  You need to re-schedule your appointment should you arrive 10 or more minutes late.  We strive to give you quality time with our providers, and arriving late affects you and other patients whose appointments are after yours.  Also, if you no show three or more times for appointments you may be dismissed from the clinic at the providers discretion.     Again, thank you for choosing [facility].  Our hope is that these requests will decrease the amount of time that you wait before being seen by our physicians.       _____________________________________________________________  Should you have questions after your visit to [facility], please contact our office at [phone] and follow the prompts.  Our office hours are 8:00 a.m. and 4:30 p.m. Monday - Friday.  Please note that voicemails left after 4:00 p.m. may not be returned until the following business day.  We are closed weekends and major holidays.  You do have access to a nurse 24-7, just call the main number to the clinic [phone] and do not press any options, hold on the line and a nurse will answer the phone.    For prescription refill requests, have your pharmacy contact our office and allow 72 hours.    Due to Covid, you will need to wear a mask upon entering the hospital. If you do not have a mask, a mask will be given to you at the Main Entrance upon arrival. For doctor visits, patients may have 1 support person age 16 or older with them. For treatment visits, patients can not have anyone with them due to social distancing guidelines and our immunocompromised population.

## 2022-01-30 NOTE — Progress Notes (Signed)
Patient presents today for Tecentriq/Avastin infusion.  Patient is in satisfactory condition with no complaints voiced.  Vital signs are stable.  Labs reviewed by Dr. Delton Coombes during her office visit.  Magnesium is 1.3 today.  Patient will receive Magnesium 4 grams IV x one dose today.  All other labs are within treatment parameters.  We will hold Avastin today per Dr. Delton Coombes.  We will proceed with Tecentriq per MD orders.   Patient tolerated treatment well with no complaints voiced.  Patient left via wheelchair in stable condition.  Vital signs stable at discharge.  Follow up as scheduled.

## 2022-01-30 NOTE — Telephone Encounter (Signed)
-----   Message from Bricelyn sent at 01/30/2022  9:47 AM EDT ----- Regarding: FW: Needs and EGD  ----- Message ----- From: Daneil Dolin, MD Sent: 01/30/2022   9:24 AM EDT To: Sydell Axon [place]; Lodema Hong, CMA Subject: Needs and EGD                                  Needs urgent triage for EGD - screen for varices - request from Dr. Raliegh Ip.  Send triage back to me this afternoon please.

## 2022-01-30 NOTE — Telephone Encounter (Signed)
Please advise 

## 2022-01-30 NOTE — Telephone Encounter (Signed)
See prior note

## 2022-01-30 NOTE — Progress Notes (Signed)
[address], [place] [id number]   CLINIC:  Medical Oncology/Hematology  PCP:  Lindell Spar, MD [address] / [place] [address] [phone]   REASON FOR VISIT:  Follow-up for right colon cancer, hepatocellular carcinoma and iron deficiency state  PRIOR THERAPY:  1. Right hemicolectomy on 10/10/2016. 2. Intermittent Feraheme last on 09/22/2018. 3. Bland embolization of hepatic lesion on 07/25/2019.  NGS Results: not done  CURRENT THERAPY: Atezolizumab and bevacizumab  BRIEF ONCOLOGIC HISTORY:  Oncology History  Colon cancer ([place])  10/10/2016 Initial Diagnosis   Colon cancer ([place])   10/10/2016 Surgery   Partial colectomy by Dr. Arnoldo Morale  Pathology shows  a 2.1 cm grade 2 adenocarcinoma of the ascending colon with invasion through the muscularis propria into the peri-colorectal tissues.      11/17/2016 PET scan   The hepatic lesion seen on the CT scan and MRI does not demonstrate hypermetabolism and is likely a benign entity.   Right maxillary sinus disease with associated hypermetabolism.   Scattered pulmonary nodules, likely benign. No follow-up needed if patient is low-risk (and has no known or suspected primary neoplasm). Non-contrast chest CT can be considered in 12 months if patient is high-risk.    12/25/2016 - 02/04/2017 Adjuvant Chemotherapy   Xeloda '2000mg'$  PO BID take for 14 days out of 21 days. Plan for total of 24 weeks of treatment.   Hepatocellular carcinoma ([place])  05/17/2019 Initial Diagnosis   Hepatocellular carcinoma ([place])   12/26/2021 -  Chemotherapy   Patient is on Treatment Plan : [place] Atezolizumab + Bevacizumab q21d Maintenance       CANCER STAGING:  Cancer Staging  Colon cancer [facility]) Staging form: Colon and Rectum, AJCC 8th Edition - Clinical: Stage IIA (cT3, cN0, cM0) - Signed by [name], MD on 11/26/2016   INTERVAL HISTORY:  Wanda Curry, a 76 y.o. female, returns for routine  follow-up and consideration for next cycle of chemotherapy. Wanda Curry was last seen on 12/19/2021.  Due for cycle #2 of Atezolizumab + Bevacizumab today.   Overall, she tells me she has been feeling pretty well. She denies hematochezia, and she reports diarrhea for 2-3 days following last treatment. She continues to take Xarelto. She reports swelling in her legs and feet since her last treatment which does not improve when she elevates her feet or with Lasix. She denies skin rash.   Overall, she feels ready for next cycle of chemo today.    REVIEW OF SYSTEMS:  Review of Systems  Constitutional:  Positive for fatigue. Negative for appetite change.  Cardiovascular:  Positive for leg swelling.  Gastrointestinal:  Positive for diarrhea and nausea. Negative for blood in stool.  Genitourinary:  Positive for dysuria.   Skin:  Negative for rash.  Neurological:  Positive for dizziness.  All other systems reviewed and are negative.   PAST MEDICAL/SURGICAL HISTORY:  Past Medical History:  Diagnosis Date   Acute pulmonary embolism ([place]) 04/15/2017   Anemia    Arthritis    RA   Cancer ([place])    skin cancer, colon, liver   Depression    Diabetes mellitus without complication (HCC)    Dyspnea    Fibromyalgia    GERD (gastroesophageal reflux disease)    GI bleed 08/12/2017   Heart murmur    ECHO scheduled 05-25-2015   Hyperlipidemia    Neuropathy    PONV (postoperative nausea and vomiting)    Puncture wound of great toe  Right leg DVT ([place]) 12/01/2020   Past Surgical History:  Procedure Laterality Date   ABDOMINAL HYSTERECTOMY     BIOPSY  09/23/2016   Procedure: BIOPSY;  Surgeon: Danie Binder, MD;  [facility];  Service: Endoscopy;;  hepatic flexure mass   CHOLECYSTECTOMY     COLONOSCOPY N/A 08/13/2017   Procedure: COLONOSCOPY;  Surgeon: Daneil Dolin, MD;  [facility];  Service: Endoscopy;  Laterality: N/A;   COLONOSCOPY WITH PROPOFOL N/A 09/23/2016    Procedure: COLONOSCOPY WITH PROPOFOL;  Surgeon: Danie Binder, MD;  [facility];  Service: Endoscopy;  Laterality: N/A;  7:30 am   HARDWARE REMOVAL Right 05/30/2015   Procedure: HARDWARE REMOVAL;  Surgeon: Ninetta Lights, MD;  Location: [facility];  Service: Orthopedics;  Laterality: Right;   IR ANGIOGRAM SELECTIVE EACH ADDITIONAL VESSEL  07/25/2019   IR ANGIOGRAM SELECTIVE EACH ADDITIONAL VESSEL  07/25/2019   IR ANGIOGRAM SELECTIVE EACH ADDITIONAL VESSEL  01/02/2021   IR ANGIOGRAM SELECTIVE EACH ADDITIONAL VESSEL  01/02/2021   IR ANGIOGRAM SELECTIVE EACH ADDITIONAL VESSEL  01/02/2021   IR ANGIOGRAM SELECTIVE EACH ADDITIONAL VESSEL  01/18/2021   IR ANGIOGRAM SELECTIVE EACH ADDITIONAL VESSEL  02/15/2021   IR ANGIOGRAM SELECTIVE EACH ADDITIONAL VESSEL  02/15/2021   IR ANGIOGRAM VISCERAL SELECTIVE  07/25/2019   IR ANGIOGRAM VISCERAL SELECTIVE  07/25/2019   IR ANGIOGRAM VISCERAL SELECTIVE  01/02/2021   IR ANGIOGRAM VISCERAL SELECTIVE  01/02/2021   IR ANGIOGRAM VISCERAL SELECTIVE  01/18/2021   IR ANGIOGRAM VISCERAL SELECTIVE  02/15/2021   IR EMBO ARTERIAL NOT HEMORR HEMANG INC GUIDE ROADMAPPING  01/02/2021   IR EMBO TUMOR ORGAN ISCHEMIA INFARCT INC GUIDE ROADMAPPING  07/25/2019   IR EMBO TUMOR ORGAN ISCHEMIA INFARCT INC GUIDE ROADMAPPING  01/18/2021   IR EMBO TUMOR ORGAN ISCHEMIA INFARCT INC GUIDE ROADMAPPING  02/15/2021   IR RADIOLOGIST EVAL & MGMT  06/09/2019   IR RADIOLOGIST EVAL & MGMT  07/07/2019   IR RADIOLOGIST EVAL & MGMT  01/05/2020   IR RADIOLOGIST EVAL & MGMT  04/12/2020   IR RADIOLOGIST EVAL & MGMT  09/27/2020   IR RADIOLOGIST EVAL & MGMT  10/31/2020   IR RADIOLOGIST EVAL & MGMT  12/12/2020   IR RADIOLOGIST EVAL & MGMT  04/04/2021   IR RADIOLOGIST EVAL & MGMT  05/23/2021   IR RADIOLOGIST EVAL & MGMT  12/17/2021   IR US GUIDE VASC ACCESS RIGHT  07/25/2019   IR US GUIDE VASC ACCESS RIGHT  01/02/2021   IR US GUIDE VASC ACCESS RIGHT  01/18/2021   IR US GUIDE VASC ACCESS RIGHT  02/15/2021   PARTIAL COLECTOMY  N/A 10/10/2016   Procedure: PARTIAL COLECTOMY;  Surgeon: Aviva Signs, MD;  Location: [facility];  Service: General;  Laterality: N/A;   planter warts Bilateral    both feet   POLYPECTOMY  09/23/2016   Procedure: POLYPECTOMY;  Surgeon: Danie Binder, MD;  [facility];  Service: Endoscopy;;  transverse colon polyps times 2, rectal polyp   PORTACATH PLACEMENT Left 01/08/2022   Procedure: INSERTION PORT-A-CATH;  Surgeon: Aviva Signs, MD;  Location: [facility];  Service: General;  Laterality: Left;   skin grafts     due to planter warts   TOTAL KNEE ARTHROPLASTY Right 05/30/2015   Procedure: RIGHT TOTAL KNEE ARTHROPLASTY;  Surgeon: Ninetta Lights, MD;  Location: [facility];  Service: Orthopedics;  Laterality: Right;    SOCIAL HISTORY:  Social History   Socioeconomic History   Marital  status: Widowed    Spouse name: Not on file   Number of children: Not on file   Years of education: Not on file   Highest education level: Not on file  Occupational History   Not on file  Tobacco Use   Smoking status: Former    Packs/day: 1.00    Years: 40.00    Total pack years: 40.00    Types: Cigarettes    Quit date: 08/18/2004    Years since quitting: 17.4   Smokeless tobacco: Never  Vaping Use   Vaping Use: Never used  Substance and Sexual Activity   Alcohol use: No   Drug use: No   Sexual activity: Not Currently    Birth control/protection: Surgical  Other Topics Concern   Not on file  Social History Narrative   Not on file   Social Determinants of Health   Financial Resource Strain: Low Risk  (01/29/2022)   Overall Financial Resource Strain (CARDIA)    Difficulty of Paying Living Expenses: Not hard at all  Food Insecurity: No Food Insecurity (01/29/2022)   Hunger Vital Sign    Worried About Running Out of Food in the Last Year: Never true    [place] in the Last Year: Never true  Transportation Needs: No Transportation Needs (01/29/2022)   PRAPARE - Radiographer, therapeutic (Medical): No    Lack of Transportation (Non-Medical): No  Physical Activity: Insufficiently Active (01/29/2022)   Exercise Vital Sign    Days of Exercise per Week: 7 days    Minutes of Exercise per Session: 20 min  Stress: No Stress Concern Present (01/29/2022)   [place]    Feeling of Stress : Not at all  Social Connections: Moderately Isolated (01/29/2022)   Social Connection and Isolation Panel [NHANES]    Frequency of Communication with Friends and Family: More than three times a week    Frequency of Social Gatherings with Friends and Family: More than three times a week    Attends Religious Services: 1 to 4 times per year    Active Member of [employer] or Organizations: No    Attends Archivist Meetings: Never    Marital Status: Widowed  Intimate Partner Violence: Not At Risk (01/29/2022)   Humiliation, Afraid, Rape, and Kick questionnaire    Fear of Current or Ex-Partner: No    Emotionally Abused: No    Physically Abused: No    Sexually Abused: No    FAMILY HISTORY:  Family History  Problem Relation Age of Onset   Breast cancer Mother    Breast cancer Daughter    Colon cancer Neg Hx     CURRENT MEDICATIONS:  Current Outpatient Medications  Medication Sig Dispense Refill   acetaminophen (TYLENOL) 500 MG tablet Take 500 mg by mouth every 6 (six) hours as needed for moderate pain.     Atezolizumab (TECENTRIQ IV) Inject into the vein every 21 ( twenty-one) days.     Bevacizumab (AVASTIN IV) Inject into the vein every 21 ( twenty-one) days.     Calcium Carbonate-Vitamin D3 600-400 MG-UNIT TABS Take 1 tablet by mouth daily.     carvedilol (COREG) 6.25 MG tablet Take 6.25 mg by mouth 2 (two) times daily with a meal.     cholecalciferol (VITAMIN D3) 25 MCG (1000 UNIT) tablet Take 1,000 Units by mouth daily.     folic acid (FOLVITE) 1 MG tablet Take 1  mg by mouth daily.     furosemide (LASIX)  20 MG tablet Take 20 mg by mouth daily as needed for edema.     gabapentin (NEURONTIN) 600 MG tablet Take 600 mg by mouth 3 (three) times daily as needed (pain).     Glucagon (GVOKE HYPOPEN 2-PACK) 1 MG/0.2ML SOAJ Inject 0.2 mLs into the skin as needed (Blood glucose less than 55). 0.4 mL 11   glucosamine-chondroitin 500-400 MG tablet Take 2 tablets by mouth daily.     insulin degludec (TRESIBA FLEXTOUCH) 100 UNIT/ML FlexTouch Pen Inject 40 Units into the skin at bedtime. 36 mL 0   lidocaine-prilocaine (EMLA) cream Apply to affected area once 30 g 3   loperamide (IMODIUM) 2 MG capsule Take 2 mg by mouth daily.     magnesium gluconate (MAGONATE) 500 MG tablet Take 500 mg by mouth daily.     methotrexate (RHEUMATREX) 2.5 MG tablet Take 20 mg by mouth every Sunday. Caution:Chemotherapy. Protect from light.     NOVOLOG MIX 70/30 FLEXPEN (70-30) 100 UNIT/ML FlexPen Inject 20-30 Units into the skin See admin instructions. Inject 30 units into the skin in the morning and inject 20 units in the evening  3   omeprazole (PRILOSEC) 40 MG capsule Take 40 mg by mouth daily.   3   prochlorperazine (COMPAZINE) 10 MG tablet Take 1 tablet (10 mg total) by mouth every 6 (six) hours as needed for nausea or vomiting. 30 tablet 3   pyridoxine (B-6) 100 MG tablet Take 100 mg by mouth daily.     rivaroxaban (XARELTO) 20 MG TABS tablet Take 1 tablet (20 mg total) by mouth daily with supper. 30 tablet 6   rOPINIRole (REQUIP) 1 MG tablet Take 1 mg by mouth 3 (three) times daily as needed (restless leg).     traMADol (ULTRAM) 50 MG tablet Take 1 tablet (50 mg total) by mouth every 6 (six) hours as needed. 15 tablet 0   vitamin C (ASCORBIC ACID) 500 MG tablet Take 500 mg by mouth daily.     No current facility-administered medications for this visit.   Facility-Administered Medications Ordered in Other Visits  Medication Dose Route Frequency Provider Last Rate Last Admin   0.9 %  sodium chloride infusion   Intravenous  Continuous Holley Bouche, NP   Stopped at 09/14/17 1440    ALLERGIES:  Allergies  Allergen Reactions   Xeloda [Capecitabine] Other (See Comments)    Blisters and pain to skin to include feet and arms   Adhesive [Tape]     Adhesive tape and band-aids cause skin irritation   Neosporin Original [Bacitracin-Neomycin-Polymyxin] Other (See Comments)    blister   Sulfa Antibiotics Nausea Only and Other (See Comments)    Joint paint   Erythromycin Itching and Rash    burning    PHYSICAL EXAM:  Performance status (ECOG): 2 - Symptomatic, <50% confined to bed  There were no vitals filed for this visit. Wt Readings from Last 3 Encounters:  01/02/22 215 lb (97.5 kg)  12/26/21 216 lb 6.4 oz (98.2 kg)  12/19/21 214 lb (97.1 kg)   Physical Exam Vitals reviewed.  Constitutional:      Appearance: Normal appearance. She is obese.     Comments: In wheelchair  Cardiovascular:     Rate and Rhythm: Normal rate and regular rhythm.     Pulses: Normal pulses.     Heart sounds: Normal heart sounds.  Pulmonary:     Effort: Pulmonary effort is  normal.     Breath sounds: Normal breath sounds.  Musculoskeletal:     Right lower leg: 3+ Edema present.     Left lower leg: 3+ Edema present.  Neurological:     General: No focal deficit present.     Mental Status: She is alert and oriented to person, place, and time.  Psychiatric:        Mood and Affect: Mood normal.        Behavior: Behavior normal.     LABORATORY DATA:  I have reviewed the labs as listed.     Latest Ref Rng & Units 12/26/2021    7:48 AM 12/19/2021   12:58 PM 05/17/2021   11:11 AM  CBC  WBC 4.0 - 10.5 K/uL 5.8  8.3  8.3   Hemoglobin 12.0 - 15.0 g/dL 9.6  10.1  14.6   Hematocrit 36.0 - 46.0 % 30.0  32.1  44.8   Platelets 150 - 400 K/uL 174  177  153       Latest Ref Rng & Units 12/26/2021    7:48 AM 12/19/2021   12:58 PM 10/15/2021   10:29 AM  CMP  Glucose 70 - 99 mg/dL 207  146    BUN 8 - 23 mg/dL 15  16     Creatinine 0.44 - 1.00 mg/dL 0.94  0.91  0.90   Sodium 135 - 145 mmol/L 135  136    Potassium 3.5 - 5.1 mmol/L 3.6  4.1    Chloride 98 - 111 mmol/L 104  106    CO2 22 - 32 mmol/L 26  26    Calcium 8.9 - 10.3 mg/dL 8.3  8.7    Total Protein 6.5 - 8.1 g/dL 5.9  6.4    Total Bilirubin 0.3 - 1.2 mg/dL 1.1  1.4    Alkaline Phos 38 - 126 U/L 74  81    AST 15 - 41 U/L 25  36    ALT 0 - 44 U/L 20  26      DIAGNOSTIC IMAGING:  I have independently reviewed the scans and discussed with the patient. DG Chest Port 1 View  Result Date: 01/08/2022 CLINICAL DATA:  Port catheter placement EXAM: PORTABLE CHEST 1 VIEW COMPARISON:  10/08/2016 FINDINGS: Cardiomegaly. Left chest port catheter, tip projecting over the mid superior vena cava. Mild pulmonary vascular prominence without overt edema or focal airspace opacity. The visualized skeletal structures are unremarkable. IMPRESSION: 1. Left chest port catheter, tip projecting over the mid superior vena cava. No pneumothorax. 2. Is this with cardiomegaly with pulmonary vascular prominence, however without overt pulmonary edema or acute airspace opacity. Electronically Signed   By: Delanna Ahmadi M.D.   On: 01/08/2022 11:33   DG C-Arm 1-60 Min-No Report  Result Date: 01/08/2022 Fluoroscopy was utilized by the requesting physician.  No radiographic interpretation.     ASSESSMENT:  1.  Hepatocellular carcinoma: -MRI of the abdomen with Eovist on 04/21/2019 showed hypoenhancing lesion in the liver, which was not typical for [facility].  It was not significantly hypermetabolic on prior PET/CT.  This lesion has been gradually increasing since 10/08/2016. - AFP was normal at 3.9. -Biopsy on 05/11/2019 shows well to moderately differentiated hepatocellular carcinoma, steatohepatitic variant. -CT chest on 05/30/2019 was negative for metastatic disease. -Bland embolization of the hepatic lesion on 07/25/2019. -MRI of the abdomen with and without contrast on 09/21/2020 showed  previous treated lesion in the central aspect of the liver measures 4.4 x 3.2 cm, previously  3.1 x 2.6 cm.  Hypervascular left liver lobe lesion measuring 1.3 x 1.1 cm.  6 mm hypervascular focus noted in the inferior aspect of the right lobe of the liver. -CTAP with and without contrast on 10/25/2020 showed left liver lesion measuring 1.8 x 1.4 cm, for inferior right liver lesion measuring 1 x 0.9 cm which are new.  Recurrent hyperenhancing tumor at the treated tumor site in the central liver, measuring 6 x 5 cm with extension into the caudate lobe.  No evidence of metastatic disease in the chest. - Left lobe liver biopsy on 11/29/2020-well to moderately differentiated [place]. - MRI (12/12/2021): Several new arterial phase enhancing lesions concerning for progressive malignancy. - Cycle 1 of Atezolizumab on 12/26/2021.   2.  Stage IIa (CT3CN0) colon cancer: -Right hemicolectomy on 10/10/2016, grade 2 adenocarcinoma, positive LVSI, margins negative, 0/19 lymph nodes positive. -Because of positive LVSI, she was recommended Xeloda.  She could not tolerate more than 2 cycles.  She developed severe hand-foot skin reaction. -PET scan on 02/23/2018- for metastatic disease.  Liver mass was also not seen. -CEA on 03/29/2019 was 4.5. -CT AP on 03/29/2019 showed mass of the liver measuring 5.2 cm, previously 4.8 cm.   3.  Diarrhea: -She has watery diarrhea up to once per day.  She takes Imodium 1-2 tablets/day which helps.  She had this diarrhea since surgery.   4.  Unprovoked PE: -Incidental PE on CT chest in August 2018.  She was treated with Xarelto 20 mg for a year. -Dose was reduced to 10 mg daily in October 2019.  She is tolerating it without any problems.   PLAN:  1.  Liver confined hepatocellular carcinoma: - Single agent Atezolizumab started on 12/26/2021. - She has noticed increased swelling in the legs, with worsening of left knee arthritis after first treatment.  This has made her mobility difficult and  had to delay her cycle 2. - She never had a history of variceal bleeding. - I have recommended EGD to evaluate for varices.  If she has varices, I would recommend banding prior to start of bevacizumab. - I have reviewed labs today which showed normal LFTs but low albumin.  Recommend Premier protein 1 drink every day.  Last TSH was 0.092 on 12/26/2021.  We will closely monitor. - She will proceed with cycle 2 of Atezolizumab today. - She may increase his Lasix to 40 mg daily for couple of days due to increased edema. - RTC 3 weeks for follow-up.   2.  Stage IIa (CT3CN0) colon cancer: - CTAP (10/07/2021): Did not show any evidence of metastatic disease. - Last CEA was 4.7.   3.  Unprovoked PE: - Continue Xarelto 20 mg daily.  No bleeding issues.   4.  Normocytic anemia: - Hemoglobin today 7.7.  Ferritin is down at 42 and percent saturation 9.  F68 and folic acid normal. - I have recommended Venofer 3 infusions for a total dose of 1 g.  5.  Hypomagnesemia: - Magnesium is 1.3.  She is taking magnesium 1 tablet daily. - She will receive 4 g of IV magnesium.  I will increase her magnesium to 3 times daily.   Orders placed this encounter:  No orders of the defined types were placed in this encounter.    Derek Jack, MD [place] [phone]   I, Thana Ates, am acting as a scribe for Dr. Derek Jack.  I, Derek Jack MD, have reviewed the above documentation for accuracy and  completeness, and I agree with the above.     

## 2022-01-30 NOTE — Telephone Encounter (Signed)
Per patient she is on Eliquis for h/o pulmonary embolism. Dr. Raliegh Ip last refilled so will forward request regarding holding eliquis x 2 days. Please advise thanks!

## 2022-01-30 NOTE — Telephone Encounter (Signed)
Spoke with melanie in endo. Okay to add at end of day on 6/22.  Called pt. Spoke with her and daughter [name]. Pt scheduled for 6/22. Aware will need pre-op appt prior. Advised will call back with appt. She is also aware to hold eliquis x 2 days prior. Discussed egd instructions in detail over the phone. She voiced understanding.

## 2022-01-30 NOTE — Patient Instructions (Signed)
[facility]  Discharge Instructions: Thank you for choosing [facility] to provide your oncology and hematology care.  If you have a lab appointment with the [facility], please come in thru the Main Entrance and check in at the main information desk.  Wear comfortable clothing and clothing appropriate for easy access to any Portacath or PICC line.   We strive to give you quality time with your provider. You may need to reschedule your appointment if you arrive late (15 or more minutes).  Arriving late affects you and other patients whose appointments are after yours.  Also, if you miss three or more appointments without notifying the office, you may be dismissed from the clinic at the provider's discretion.      For prescription refill requests, have your pharmacy contact our office and allow 72 hours for refills to be completed.    Today you received the following chemotherapy and/or immunotherapy agents Tecentriq.  Magnesium Sulfate Injection What is this medication? MAGNESIUM SULFATE (mag NEE zee um SUL fate) prevents and treats low levels of magnesium in your body. It may also be used to prevent and treat seizures during pregnancy in people with high blood pressure disorders, such as preeclampsia or eclampsia. Magnesium plays an important role in maintaining the health of your muscles and nervous system. This medicine may be used for other purposes; ask your health care provider or pharmacist if you have questions. What should I tell my care team before I take this medication? They need to know if you have any of these conditions: Heart disease History of irregular heart beat Kidney disease An unusual or allergic reaction to magnesium sulfate, medications, foods, dyes, or preservatives Pregnant or trying to get pregnant Breast-feeding How should I use this medication? This medication is for infusion into a vein. It is given in a hospital or clinic  setting. Talk to your care team about the use of this medication in children. While this medication may be prescribed for selected conditions, precautions do apply. Overdosage: If you think you have taken too much of this medicine contact a poison control center or emergency room at once. NOTE: This medicine is only for you. Do not share this medicine with others. What if I miss a dose? This does not apply. What may interact with this medication? Certain medications for anxiety or sleep Certain medications for seizures like phenobarbital Digoxin Medications that relax muscles for surgery Narcotic medications for pain This list may not describe all possible interactions. Give your health care provider a list of all the medicines, herbs, non-prescription drugs, or dietary supplements you use. Also tell them if you smoke, drink alcohol, or use illegal drugs. Some items may interact with your medicine. What should I watch for while using this medication? Your condition will be monitored carefully while you are receiving this medication. You may need blood work done while you are receiving this medication. What side effects may I notice from receiving this medication? Side effects that you should report to your care team as soon as possible: Allergic reactions--skin rash, itching, hives, swelling of the face, lips, tongue, or throat High magnesium level--confusion, drowsiness, facial flushing, redness, sweating, muscle weakness, fast or irregular heartbeat, trouble breathing Low blood pressure--dizziness, feeling faint or lightheaded, blurry vision Side effects that usually do not require medical attention (report to your care team if they continue or are bothersome): Headache Nausea This list may not describe all possible side effects. Call your doctor  for medical advice about side effects. You may report side effects to FDA at 1-800-FDA-1088. Where should I keep my medication? This medication  is given in a hospital or clinic and will not be stored at home. NOTE: This sheet is a summary. It may not cover all possible information. If you have questions about this medicine, talk to your doctor, pharmacist, or health care provider.  2023 Elsevier/Gold Standard (2020-10-18 00:00:00)   Atezolizumab injection What is this medication? ATEZOLIZUMAB (a te zoe LIZ ue mab) is a monoclonal antibody. It is used to treat bladder cancer (urothelial cancer), liver cancer, lung cancer, and melanoma. This medicine may be used for other purposes; ask your health care provider or pharmacist if you have questions. COMMON BRAND NAME(S): Tecentriq What should I tell my care team before I take this medication? They need to know if you have any of these conditions: autoimmune diseases like Crohn's disease, ulcerative colitis, or lupus have had or planning to have an allogeneic stem cell transplant (uses someone else's stem cells) history of organ transplant history of radiation to the chest nervous system problems like myasthenia gravis or Guillain-Barre syndrome an unusual or allergic reaction to atezolizumab, other medicines, foods, dyes, or preservatives pregnant or trying to get pregnant breast-feeding How should I use this medication? This medicine is for infusion into a vein. It is given by a health care professional in a hospital or clinic setting. A special MedGuide will be given to you before each treatment. Be sure to read this information carefully each time. Talk to your pediatrician regarding the use of this medicine in children. Special care may be needed. Overdosage: If you think you have taken too much of this medicine contact a poison control center or emergency room at once. NOTE: This medicine is only for you. Do not share this medicine with others. What if I miss a dose? It is important not to miss your dose. Call your doctor or health care professional if you are unable to keep an  appointment. What may interact with this medication? Interactions have not been studied. This list may not describe all possible interactions. Give your health care provider a list of all the medicines, herbs, non-prescription drugs, or dietary supplements you use. Also tell them if you smoke, drink alcohol, or use illegal drugs. Some items may interact with your medicine. What should I watch for while using this medication? Your condition will be monitored carefully while you are receiving this medicine. You may need blood work done while you are taking this medicine. Do not become pregnant while taking this medicine or for at least 5 months after stopping it. Women should inform their doctor if they wish to become pregnant or think they might be pregnant. There is a potential for serious side effects to an unborn child. Talk to your health care professional or pharmacist for more information. Do not breast-feed an infant while taking this medicine or for at least 5 months after the last dose. What side effects may I notice from receiving this medication? Side effects that you should report to your doctor or health care professional as soon as possible: allergic reactions like skin rash, itching or hives, swelling of the face, lips, or tongue black, tarry stools bloody or watery diarrhea breathing problems changes in vision chest pain or chest tightness chills facial flushing fever headache signs and symptoms of high blood sugar such as dizziness; dry mouth; dry skin; fruity breath; nausea; stomach pain; increased hunger or  thirst; increased urination signs and symptoms of liver injury like dark yellow or Tameika Heckmann urine; general ill feeling or flu-like symptoms; light-colored stools; loss of appetite; nausea; right upper belly pain; unusually weak or tired; yellowing of the eyes or skin stomach pain trouble passing urine or change in the amount of urine Side effects that usually do not require  medical attention (report to your doctor or health care professional if they continue or are bothersome): bone pain cough diarrhea joint pain muscle pain muscle weakness swelling of arms or legs tiredness weight loss This list may not describe all possible side effects. Call your doctor for medical advice about side effects. You may report side effects to FDA at 1-800-FDA-1088. Where should I keep my medication? This drug is given in a hospital or clinic and will not be stored at home. NOTE: This sheet is a summary. It may not cover all possible information. If you have questions about this medicine, talk to your doctor, pharmacist, or health care provider.  2023 Elsevier/Gold Standard (2021-07-05 00:00:00)        To help prevent nausea and vomiting after your treatment, we encourage you to take your nausea medication as directed.  BELOW ARE SYMPTOMS THAT SHOULD BE REPORTED IMMEDIATELY: *FEVER GREATER THAN 100.4 F (38 C) OR HIGHER *CHILLS OR SWEATING *NAUSEA AND VOMITING THAT IS NOT CONTROLLED WITH YOUR NAUSEA MEDICATION *UNUSUAL SHORTNESS OF BREATH *UNUSUAL BRUISING OR BLEEDING *URINARY PROBLEMS (pain or burning when urinating, or frequent urination) *BOWEL PROBLEMS (unusual diarrhea, constipation, pain near the anus) TENDERNESS IN MOUTH AND THROAT WITH OR WITHOUT PRESENCE OF ULCERS (sore throat, sores in mouth, or a toothache) UNUSUAL RASH, SWELLING OR PAIN  UNUSUAL VAGINAL DISCHARGE OR ITCHING   Items with * indicate a potential emergency and should be followed up as soon as possible or go to the Emergency Department if any problems should occur.  Please show the CHEMOTHERAPY ALERT CARD or IMMUNOTHERAPY ALERT CARD at check-in to the Emergency Department and triage nurse.  Should you have questions after your visit or need to cancel or reschedule your appointment, please contact [facility] [phone]  and follow the prompts.  Office hours are 8:00 a.m. to 4:30  p.m. Monday - Friday. Please note that voicemails left after 4:00 p.m. may not be returned until the following business day.  We are closed weekends and major holidays. You have access to a nurse at all times for urgent questions. Please call the main number to the clinic [phone] and follow the prompts.  For any non-urgent questions, you may also contact your provider using MyChart. We now offer e-Visits for anyone 96 and older to request care online for non-urgent symptoms. For details visit [email or web address].   Also download the MyChart app! Go to the app store, search "MyChart", open the app, select [facility], and log in with your MyChart username and password.  Masks are optional in the cancer centers. If you would like for your care team to wear a mask while they are taking care of you, please let them know. For doctor visits, patients may have with them one support person who is at least 78 years old. At this time, visitors are not allowed in the infusion area.

## 2022-01-30 NOTE — Telephone Encounter (Signed)
Per Dr. Delton Coombes, ok to hold Xarelto 2 days prior to procedure.  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

## 2022-01-30 NOTE — Telephone Encounter (Signed)
-----   Message from Daneil Dolin, MD sent at 01/30/2022  9:22 AM EDT ----- Regarding: Needs and EGD Needs urgent triage for EGD - screen for varices - request from Dr. Raliegh Ip.  Send triage back to me this afternoon please.

## 2022-01-31 NOTE — Telephone Encounter (Signed)
Called pt and made aware of pre-op appt details

## 2022-02-03 NOTE — Pre-Procedure Instructions (Signed)
   RE: labs Received: Today Rourk, Cristopher Estimable, MD  Encarnacion Chu, RN Lets make it a CBC tomorrow and type and screen.  She has liver cancer.  That increases the risk of bleeding a little bit.  However, I would not transfuse unless her hemoglobin drops below 7.  Thanks.        Previous Messages    ----- Message -----  From: Encarnacion Chu, RN  Sent: 02/03/2022  11:48 AM EDT  To: Mindy Mickle Asper, CMA; Daneil Dolin, MD  Subject: labs                                           Good morning to you! Keslyn Teater has a pre-op tomorrow. Her H&H on 6/15 was 7.7/24.6. She is having an EGD on 6/22 for variceal screen. Do you want to repeat H&H and/ or do a type and screen or type and cross on her when she comes in tomorrow?

## 2022-02-03 NOTE — Patient Instructions (Signed)
Wanda Curry  02/03/2022     '@PREFPERIOPPHARMACY'$ @   Your procedure is scheduled on  02/06/2022.   Report to [facility] at  1400   (2:00) P.M.   Call this number if you have problems the morning of surgery:  [phone]   Remember:  Follow the diet instructions given to you bu the office.     Your last dose of eliquis should be on 02/03/2022.    Take 20 units of your night time insulin the night before your procedure.    DO NOT take any medications for diabetes the morning of your procedure.     Take these medicines the morning of surgery with A SIP OF WATER                          coreg, prilosec, requip.    Do not wear jewelry, make-up or nail polish.  Do not wear lotions, powders, or perfumes, or deodorant.  Do not shave 48 hours prior to surgery.  Men may shave face and neck.  Do not bring valuables to the hospital.  [facility] is not responsible for any belongings or valuables.  Contacts, dentures or bridgework may not be worn into surgery.  Leave your suitcase in the car.  After surgery it may be brought to your room.  For patients admitted to the hospital, discharge time will be determined by your treatment team.  Patients discharged the day of surgery will not be allowed to drive home and must have someone with them for 24 hours.    Special instructions:   DO NOT smoke tobacco or vape for 24 hours before your procedure.  Please read over the following fact sheets that you were given. Anesthesia Post-op Instructions and Care and Recovery After Surgery      Upper Endoscopy, Adult, Care After This sheet gives you information about how to care for yourself after your procedure. Your health care provider may also give you more specific instructions. If you have problems or questions, contact your health care provider. What can I expect after the procedure? After the procedure, it is common to have: A sore throat. Mild stomach pain or  discomfort. Bloating. Nausea. Follow these instructions at home:  Follow instructions from your health care provider about what to eat or drink after your procedure. Return to your normal activities as told by your health care provider. Ask your health care provider what activities are safe for you. Take over-the-counter and prescription medicines only as told by your health care provider. If you were given a sedative during the procedure, it can affect you for several hours. Do not drive or operate machinery until your health care provider says that it is safe. Keep all follow-up visits as told by your health care provider. This is important. Contact a health care provider if you have: A sore throat that lasts longer than one day. Trouble swallowing. Get help right away if: You vomit blood or your vomit looks like coffee grounds. You have: A fever. Bloody, black, or tarry stools. A severe sore throat or you cannot swallow. Difficulty breathing. Severe pain in your chest or abdomen. Summary After the procedure, it is common to have a sore throat, mild stomach discomfort, bloating, and nausea. If you were given a sedative during the procedure, it can affect you for several hours. Do not drive or operate machinery until your health care provider says  that it is safe. Follow instructions from your health care provider about what to eat or drink after your procedure. Return to your normal activities as told by your health care provider. This information is not intended to replace advice given to you by your health care provider. Make sure you discuss any questions you have with your health care provider. Document Revised: 06/10/2019 Document Reviewed: 01/04/2018 Elsevier Patient Education  [place] After This sheet gives you information about how to care for yourself after your procedure. Your health care provider may also give you more specific  instructions. If you have problems or questions, contact your health care provider. What can I expect after the procedure? After the procedure, it is common to have: Tiredness. Forgetfulness about what happened after the procedure. Impaired judgment for important decisions. Nausea or vomiting. Some difficulty with balance. Follow these instructions at home: For the time period you were told by your health care provider:     Rest as needed. Do not participate in activities where you could fall or become injured. Do not drive or use machinery. Do not drink alcohol. Do not take sleeping pills or medicines that cause drowsiness. Do not make important decisions or sign legal documents. Do not take care of children on your own. Eating and drinking Follow the diet that is recommended by your health care provider. Drink enough fluid to keep your urine pale yellow. If you vomit: Drink water, juice, or soup when you can drink without vomiting. Make sure you have little or no nausea before eating solid foods. General instructions Have a responsible adult stay with you for the time you are told. It is important to have someone help care for you until you are awake and alert. Take over-the-counter and prescription medicines only as told by your health care provider. If you have sleep apnea, surgery and certain medicines can increase your risk for breathing problems. Follow instructions from your health care provider about wearing your sleep device: Anytime you are sleeping, including during daytime naps. While taking prescription pain medicines, sleeping medicines, or medicines that make you drowsy. Avoid smoking. Keep all follow-up visits as told by your health care provider. This is important. Contact a health care provider if: You keep feeling nauseous or you keep vomiting. You feel light-headed. You are still sleepy or having trouble with balance after 24 hours. You develop a rash. You  have a fever. You have redness or swelling around the IV site. Get help right away if: You have trouble breathing. You have new-onset confusion at home. Summary For several hours after your procedure, you may feel tired. You may also be forgetful and have poor judgment. Have a responsible adult stay with you for the time you are told. It is important to have someone help care for you until you are awake and alert. Rest as told. Do not drive or operate machinery. Do not drink alcohol or take sleeping pills. Get help right away if you have trouble breathing, or if you suddenly become confused. This information is not intended to replace advice given to you by your health care provider. Make sure you discuss any questions you have with your health care provider. Document Revised: 07/09/2021 Document Reviewed: 07/07/2019 Elsevier Patient Education  [place].

## 2022-02-04 ENCOUNTER — Encounter (HOSPITAL_COMMUNITY)
Admission: RE | Admit: 2022-02-04 | Discharge: 2022-02-04 | Disposition: A | Discharge status: Home / Self Care | Payer: Medicare Other | Source: Ambulatory Visit | Attending: Internal Medicine | Admitting: Internal Medicine

## 2022-02-04 ENCOUNTER — Encounter (HOSPITAL_COMMUNITY): Payer: Self-pay

## 2022-02-04 VITALS — BP 117/47 | HR 96 | Temp 97.9°F | Resp 18 | Ht 65.35 in | Wt 223.9 lb

## 2022-02-04 DIAGNOSIS — Z01818 Encounter for other preprocedural examination: Secondary | ICD-10-CM | POA: Insufficient documentation

## 2022-02-04 DIAGNOSIS — I10 Essential (primary) hypertension: Secondary | ICD-10-CM | POA: Diagnosis not present

## 2022-02-04 DIAGNOSIS — D649 Anemia, unspecified: Secondary | ICD-10-CM | POA: Diagnosis not present

## 2022-02-04 LAB — CBC WITH DIFFERENTIAL/PLATELET
Abs Immature Granulocytes: 0.02 10*3/uL (ref 0.00–0.07)
Basophils Absolute: 0 10*3/uL (ref 0.0–0.1)
Basophils Relative: 0 %
Eosinophils Absolute: 0.2 10*3/uL (ref 0.0–0.5)
Eosinophils Relative: 3 %
HCT: 27.9 % — ABNORMAL LOW (ref 36.0–46.0)
Hemoglobin: 8.4 g/dL — ABNORMAL LOW (ref 12.0–15.0)
Immature Granulocytes: 0 %
Lymphocytes Relative: 17 %
Lymphs Abs: 0.9 10*3/uL (ref 0.7–4.0)
MCH: 28 pg (ref 26.0–34.0)
MCHC: 30.1 g/dL (ref 30.0–36.0)
MCV: 93 fL (ref 80.0–100.0)
Monocytes Absolute: 0.2 10*3/uL (ref 0.1–1.0)
Monocytes Relative: 3 %
Neutro Abs: 4 10*3/uL (ref 1.7–7.7)
Neutrophils Relative %: 77 %
Platelets: 178 10*3/uL (ref 150–400)
RBC: 3 MIL/uL — ABNORMAL LOW (ref 3.87–5.11)
RDW: 16.9 % — ABNORMAL HIGH (ref 11.5–15.5)
WBC: 5.2 10*3/uL (ref 4.0–10.5)
nRBC: 0 % (ref 0.0–0.2)

## 2022-02-04 LAB — TYPE AND SCREEN
ABO/RH(D): O POS
Antibody Screen: NEGATIVE

## 2022-02-06 ENCOUNTER — Ambulatory Visit (HOSPITAL_COMMUNITY): Payer: Medicare Other | Admitting: Anesthesiology

## 2022-02-06 ENCOUNTER — Encounter (HOSPITAL_COMMUNITY)
Admission: RE | Disposition: A | Discharge status: Home / Self Care | Payer: Self-pay | Source: Home / Self Care | Attending: Internal Medicine

## 2022-02-06 ENCOUNTER — Encounter (HOSPITAL_COMMUNITY): Payer: Self-pay | Admitting: Internal Medicine

## 2022-02-06 ENCOUNTER — Ambulatory Visit (HOSPITAL_BASED_OUTPATIENT_CLINIC_OR_DEPARTMENT_OTHER)
Admission: RE | Admit: 2022-02-06 | Discharge: 2022-02-06 | Disposition: A | Discharge status: Home / Self Care | Payer: Medicare Other | Source: Home / Self Care | Attending: Internal Medicine | Admitting: Internal Medicine

## 2022-02-06 ENCOUNTER — Ambulatory Visit (HOSPITAL_BASED_OUTPATIENT_CLINIC_OR_DEPARTMENT_OTHER): Payer: Medicare Other | Admitting: Anesthesiology

## 2022-02-06 DIAGNOSIS — K449 Diaphragmatic hernia without obstruction or gangrene: Secondary | ICD-10-CM | POA: Insufficient documentation

## 2022-02-06 DIAGNOSIS — E119 Type 2 diabetes mellitus without complications: Secondary | ICD-10-CM

## 2022-02-06 DIAGNOSIS — K7460 Unspecified cirrhosis of liver: Secondary | ICD-10-CM | POA: Insufficient documentation

## 2022-02-06 DIAGNOSIS — R112 Nausea with vomiting, unspecified: Secondary | ICD-10-CM | POA: Diagnosis not present

## 2022-02-06 DIAGNOSIS — K219 Gastro-esophageal reflux disease without esophagitis: Secondary | ICD-10-CM | POA: Insufficient documentation

## 2022-02-06 DIAGNOSIS — C801 Malignant (primary) neoplasm, unspecified: Secondary | ICD-10-CM

## 2022-02-06 DIAGNOSIS — I10 Essential (primary) hypertension: Secondary | ICD-10-CM

## 2022-02-06 DIAGNOSIS — Z86718 Personal history of other venous thrombosis and embolism: Secondary | ICD-10-CM | POA: Insufficient documentation

## 2022-02-06 DIAGNOSIS — K3189 Other diseases of stomach and duodenum: Secondary | ICD-10-CM | POA: Insufficient documentation

## 2022-02-06 DIAGNOSIS — N289 Disorder of kidney and ureter, unspecified: Secondary | ICD-10-CM | POA: Insufficient documentation

## 2022-02-06 DIAGNOSIS — R0602 Shortness of breath: Secondary | ICD-10-CM | POA: Insufficient documentation

## 2022-02-06 DIAGNOSIS — K259 Gastric ulcer, unspecified as acute or chronic, without hemorrhage or perforation: Secondary | ICD-10-CM | POA: Insufficient documentation

## 2022-02-06 DIAGNOSIS — Z86711 Personal history of pulmonary embolism: Secondary | ICD-10-CM | POA: Insufficient documentation

## 2022-02-06 DIAGNOSIS — M179 Osteoarthritis of knee, unspecified: Secondary | ICD-10-CM

## 2022-02-06 DIAGNOSIS — D649 Anemia, unspecified: Secondary | ICD-10-CM | POA: Insufficient documentation

## 2022-02-06 DIAGNOSIS — C189 Malignant neoplasm of colon, unspecified: Secondary | ICD-10-CM

## 2022-02-06 DIAGNOSIS — M797 Fibromyalgia: Secondary | ICD-10-CM | POA: Insufficient documentation

## 2022-02-06 DIAGNOSIS — C220 Liver cell carcinoma: Secondary | ICD-10-CM

## 2022-02-06 DIAGNOSIS — M069 Rheumatoid arthritis, unspecified: Secondary | ICD-10-CM | POA: Insufficient documentation

## 2022-02-06 DIAGNOSIS — K529 Noninfective gastroenteritis and colitis, unspecified: Secondary | ICD-10-CM

## 2022-02-06 DIAGNOSIS — R011 Cardiac murmur, unspecified: Secondary | ICD-10-CM | POA: Insufficient documentation

## 2022-02-06 DIAGNOSIS — N183 Chronic kidney disease, stage 3 unspecified: Secondary | ICD-10-CM

## 2022-02-06 DIAGNOSIS — B351 Tinea unguium: Secondary | ICD-10-CM

## 2022-02-06 DIAGNOSIS — G2581 Restless legs syndrome: Secondary | ICD-10-CM | POA: Insufficient documentation

## 2022-02-06 DIAGNOSIS — R1310 Dysphagia, unspecified: Secondary | ICD-10-CM | POA: Insufficient documentation

## 2022-02-06 DIAGNOSIS — K224 Dyskinesia of esophagus: Secondary | ICD-10-CM | POA: Diagnosis not present

## 2022-02-06 DIAGNOSIS — I8510 Secondary esophageal varices without bleeding: Secondary | ICD-10-CM | POA: Diagnosis not present

## 2022-02-06 DIAGNOSIS — K766 Portal hypertension: Secondary | ICD-10-CM | POA: Insufficient documentation

## 2022-02-06 DIAGNOSIS — E1140 Type 2 diabetes mellitus with diabetic neuropathy, unspecified: Secondary | ICD-10-CM | POA: Insufficient documentation

## 2022-02-06 DIAGNOSIS — Z794 Long term (current) use of insulin: Secondary | ICD-10-CM | POA: Insufficient documentation

## 2022-02-06 DIAGNOSIS — D759 Disease of blood and blood-forming organs, unspecified: Secondary | ICD-10-CM | POA: Insufficient documentation

## 2022-02-06 DIAGNOSIS — Z87891 Personal history of nicotine dependence: Secondary | ICD-10-CM

## 2022-02-06 DIAGNOSIS — K922 Gastrointestinal hemorrhage, unspecified: Secondary | ICD-10-CM

## 2022-02-06 HISTORY — PX: ESOPHAGOGASTRODUODENOSCOPY (EGD) WITH PROPOFOL: SHX5813

## 2022-02-06 HISTORY — PX: ESOPHAGEAL BANDING: SHX5518

## 2022-02-06 HISTORY — PX: BIOPSY: SHX5522

## 2022-02-06 LAB — GLUCOSE, CAPILLARY: Glucose-Capillary: 127 mg/dL — ABNORMAL HIGH (ref 70–99)

## 2022-02-06 SURGERY — ESOPHAGOGASTRODUODENOSCOPY (EGD) WITH PROPOFOL
Anesthesia: General

## 2022-02-06 MED ORDER — PROPOFOL 10 MG/ML IV BOLUS
INTRAVENOUS | Status: DC | PRN
  Administered 2022-02-06: 70 mg via INTRAVENOUS
  Administered 2022-02-06 (×2): 50 mg via INTRAVENOUS

## 2022-02-06 MED ORDER — LACTATED RINGERS IV SOLN
INTRAVENOUS | Status: DC
  Administered 2022-02-06: 1000 mL via INTRAVENOUS

## 2022-02-06 MED ORDER — LIDOCAINE VISCOUS HCL 2 % MT SOLN: 15.0000 mL | Freq: Once | OROMUCOSAL | Status: DC

## 2022-02-06 MED ORDER — LIDOCAINE HCL (CARDIAC) PF 50 MG/5ML IV SOSY
PREFILLED_SYRINGE | INTRAVENOUS | Status: DC | PRN
  Administered 2022-02-06: 50 mg via INTRAVENOUS

## 2022-02-06 MED ORDER — LIDOCAINE VISCOUS HCL 2 % MT SOLN
OROMUCOSAL | Status: AC
  Administered 2022-02-06: 15 mL
  Filled 2022-02-06: qty 15

## 2022-02-06 NOTE — Op Note (Signed)
[facility] Patient Name: Wanda Curry Procedure Date: 02/06/2022 10:34 AM MRN: [id number] Date of Birth: [birth year] Attending MD: Norvel Richards , MD CSN: [id number] Age: 76 Admit Type: Outpatient Procedure:                Upper GI endoscopy Indications:              Dysphagia Providers:                Norvel Richards, MD, Janeece Riggers, RN, Everardo Pacific, [place] Risa Grill, Technician,                            [place] Page Referring MD:              Medicines:                Propofol per Anesthesia Complications:            No immediate complications. Estimated Blood Loss:     Estimated blood loss was minimal. Procedure:                Pre-Anesthesia Assessment:                           - Prior to the procedure, a History and Physical                            was performed, and patient medications and                            allergies were reviewed. The patient's tolerance of                            previous anesthesia was also reviewed. The risks                            and benefits of the procedure and the sedation                            options and risks were discussed with the patient.                            All questions were answered, and informed consent                            was obtained. Prior Anticoagulants: The patient has                            taken no previous anticoagulant or antiplatelet                            agents. ASA Grade Assessment: III - A patient with  severe systemic disease. After reviewing the risks                            and benefits, the patient was deemed in                            satisfactory condition to undergo the procedure.                           After obtaining informed consent, the endoscope was                            passed under direct vision. Throughout the                            procedure, the patient's blood  pressure, pulse, and                            oxygen saturations were monitored continuously. The                            [id number]-[id number] ([id number]) scope was introduced through the                            mouth, and advanced to the second part of duodenum.                            The upper GI endoscopy was accomplished without                            difficulty. The patient tolerated the procedure                            well. 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       (2) grade 2; (1) grade 3. No bleeding stigmata.      A small hiatal hernia was present. Fish scale/neck consistent with       portal hypertensive gastropathy. Focal antral erosions and patchy       erythema. No ulcer or gastric varices seen. Patent pylorus.      The duodenal bulb and second portion of the duodenum were normal.       Biopsies of the antrum and gastric body taken for histologic study.       Scope was withdrawn. Microvasive 7 shot bander applied. Scope       reintroduced to the esophagus. 3 bands placed helically #1 on each       column) starting at the GE junction moving cephalad. This was done       without difficulty. Hemostasis maintained. Impression:               - Small hiatal hernia. Esophageal varices - EBL  performed; portal hypertensive gastropathy. Gastric                            erosions. Status post biopsy                           - Normal duodenal bulb and second portion of the                            duodenum. Moderate Sedation:      Moderate (conscious) sedation was personally administered by an       anesthesia professional. The following parameters were monitored: oxygen       saturation, heart rate, blood pressure, respiratory rate, EKG, adequacy       of pulmonary ventilation, and response to  care. Recommendation:           - Patient has a contact number available for                            emergencies. The signs and symptoms of potential                            delayed complications were discussed with the                            patient. Return to normal activities tomorrow.                            Written discharge instructions were provided to the                            patient.                           - Advance diet as tolerated. Resume Xarelto                            tomorrow. Follow-up on pathology. Repeat EGD in 2                            weeks Procedure Code(s):        --- Professional ---                           [phone], Esophagogastroduodenoscopy, flexible,                            transoral; diagnostic, including collection of                            specimen(s) by brushing or washing, when performed                            (separate procedure) Diagnosis Code(s):        --- Professional ---  K44.9, Diaphragmatic hernia without obstruction or                            gangrene                           R13.10, Dysphagia, unspecified CPT copyright 2019 American Medical Association. All rights reserved. The codes documented in this report are preliminary and upon coder review may  be revised to meet current compliance requirements. Cristopher Estimable. Dionta Larke, MD Norvel Richards, MD 02/06/2022 1:22:12 PM This report has been signed electronically. Number of Addenda: 0

## 2022-02-06 NOTE — Discharge Instructions (Addendum)
EGD Discharge instructions Please read the instructions outlined below and refer to this sheet in the next few weeks. These discharge instructions provide you with general information on caring for yourself after you leave the hospital. Your doctor may also give you specific instructions. While your treatment has been planned according to the most current medical practices available, unavoidable complications occasionally occur. If you have any problems or questions after discharge, please call your doctor. ACTIVITY You may resume your regular activity but move at a slower pace for the next 24 hours.  Take frequent rest periods for the next 24 hours.  Walking will help expel (get rid of) the air and reduce the bloated feeling in your abdomen.  No driving for 24 hours (because of the anesthesia (medicine) used during the test).  You may shower.  Do not sign any important legal documents or operate any machinery for 24 hours (because of the anesthesia used during the test).  NUTRITION Drink plenty of fluids.  You may resume your normal diet.  Begin with a light meal and progress to your normal diet.  Avoid alcoholic beverages for 24 hours or as instructed by your caregiver.  MEDICATIONS You may resume your normal medications unless your caregiver tells you otherwise.  WHAT YOU CAN EXPECT TODAY You may experience abdominal discomfort such as a feeling of fullness or "gas" pains.  FOLLOW-UP Your doctor will discuss the results of your test with you.  SEEK IMMEDIATE MEDICAL ATTENTION IF ANY OF THE FOLLOWING OCCUR: Excessive nausea (feeling sick to your stomach) and/or vomiting.  Severe abdominal pain and distention (swelling).  Trouble swallowing.  Temperature over 101 F (37.8 C).  Rectal bleeding or vomiting of blood.    Inflamed stomach.  Biopsies taken.  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  Bands placed.  Further recommendations to follow pending review of pathology report  As  discussed, we will need to repeat EGD for additional bands and as needed in 2 weeks  At patient request, I called Edgar Frisk at [phone] findings and recommendations

## 2022-02-06 NOTE — Anesthesia Postprocedure Evaluation (Signed)
Anesthesia Post Note  Patient: Wanda Curry  Procedure(s) Performed: ESOPHAGOGASTRODUODENOSCOPY (EGD) WITH PROPOFOL BIOPSY ESOPHAGEAL BANDING  Patient location during evaluation: Phase II Anesthesia Type: General Level of consciousness: awake and alert and oriented Pain management: pain level controlled Vital Signs Assessment: post-procedure vital signs reviewed and stable Respiratory status: spontaneous breathing, nonlabored ventilation and respiratory function stable Cardiovascular status: blood pressure returned to baseline and stable Postop Assessment: no apparent nausea or vomiting Anesthetic complications: no   No notable events documented.   Last Vitals:  Vitals:   02/06/22 1203 02/06/22 1312  BP: (!) 163/66 (!) (P) 136/50  Pulse:  (P) 97  Resp: 20 (!) (P) 22  Temp: 36.7 C (P) 36.7 C  SpO2: 100% (P) 100%    Last Pain:  Vitals:   02/06/22 1312  TempSrc: (P) Axillary  PainSc:                  Keefer Soulliere C Kenzlei Runions

## 2022-02-06 NOTE — H&P (Signed)
$'@LOGO'h$ @   Primary Care Physician:  Lindell Spar, MD Primary Gastroenterologist:  Dr.   Pre-Procedure History & Physical: HPI:  Wanda Curry is a 76 y.o. 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 Initiating atezolizumab and bevacizumab.  Screening EGD as the risk of bleeding with these agents is increased.  If she has esophageal varices likely perform esophageal banding.  Past Medical History:  Diagnosis Date   Acute pulmonary embolism ([place]) 04/15/2017   Anemia    Arthritis    RA   Cancer (HCC)    skin cancer, colon, liver   Depression    Diabetes mellitus without complication (HCC)    Dyspnea    Fibromyalgia    GERD (gastroesophageal reflux disease)    GI bleed 08/12/2017   Heart murmur    ECHO scheduled 05-25-2015   Hyperlipidemia    Neuropathy    PONV (postoperative nausea and vomiting)    Puncture wound of great toe    Right leg DVT ([place]) 12/01/2020    Past Surgical History:  Procedure Laterality Date   ABDOMINAL HYSTERECTOMY     BIOPSY  09/23/2016   Procedure: BIOPSY;  Surgeon: Danie Binder, MD;  [facility];  Service: Endoscopy;;  hepatic flexure mass   CHOLECYSTECTOMY     COLONOSCOPY N/A 08/13/2017   Procedure: COLONOSCOPY;  Surgeon: Daneil Dolin, MD;  [facility];  Service: Endoscopy;  Laterality: N/A;   COLONOSCOPY WITH PROPOFOL N/A 09/23/2016   Procedure: COLONOSCOPY WITH PROPOFOL;  Surgeon: Danie Binder, MD;  [facility];  Service: Endoscopy;  Laterality: N/A;  7:30 am   HARDWARE REMOVAL Right 05/30/2015   Procedure: HARDWARE REMOVAL;  Surgeon: Ninetta Lights, MD;  Location: [place];  Service: Orthopedics;  Laterality: Right;   IR ANGIOGRAM SELECTIVE EACH ADDITIONAL VESSEL  07/25/2019   IR ANGIOGRAM SELECTIVE EACH ADDITIONAL VESSEL  07/25/2019   IR ANGIOGRAM SELECTIVE EACH ADDITIONAL VESSEL  01/02/2021   IR ANGIOGRAM SELECTIVE EACH ADDITIONAL VESSEL  01/02/2021    IR ANGIOGRAM SELECTIVE EACH ADDITIONAL VESSEL  01/02/2021   IR ANGIOGRAM SELECTIVE EACH ADDITIONAL VESSEL  01/18/2021   IR ANGIOGRAM SELECTIVE EACH ADDITIONAL VESSEL  02/15/2021   IR ANGIOGRAM SELECTIVE EACH ADDITIONAL VESSEL  02/15/2021   IR ANGIOGRAM VISCERAL SELECTIVE  07/25/2019   IR ANGIOGRAM VISCERAL SELECTIVE  07/25/2019   IR ANGIOGRAM VISCERAL SELECTIVE  01/02/2021   IR ANGIOGRAM VISCERAL SELECTIVE  01/02/2021   IR ANGIOGRAM VISCERAL SELECTIVE  01/18/2021   IR ANGIOGRAM VISCERAL SELECTIVE  02/15/2021   IR EMBO ARTERIAL NOT HEMORR HEMANG INC GUIDE ROADMAPPING  01/02/2021   IR EMBO TUMOR ORGAN ISCHEMIA INFARCT INC GUIDE ROADMAPPING  07/25/2019   IR EMBO TUMOR ORGAN ISCHEMIA INFARCT INC GUIDE ROADMAPPING  01/18/2021   IR EMBO TUMOR ORGAN ISCHEMIA INFARCT INC GUIDE ROADMAPPING  02/15/2021   IR RADIOLOGIST EVAL & MGMT  06/09/2019   IR RADIOLOGIST EVAL & MGMT  07/07/2019   IR RADIOLOGIST EVAL & MGMT  01/05/2020   IR RADIOLOGIST EVAL & MGMT  04/12/2020   IR RADIOLOGIST EVAL & MGMT  09/27/2020   IR RADIOLOGIST EVAL & MGMT  10/31/2020   IR RADIOLOGIST EVAL & MGMT  12/12/2020   IR RADIOLOGIST EVAL & MGMT  04/04/2021   IR RADIOLOGIST EVAL & MGMT  05/23/2021   IR RADIOLOGIST EVAL & MGMT  12/17/2021   IR US GUIDE VASC ACCESS RIGHT  07/25/2019   IR US  GUIDE VASC ACCESS RIGHT  01/02/2021   IR US GUIDE VASC ACCESS RIGHT  01/18/2021   IR US GUIDE VASC ACCESS RIGHT  02/15/2021   PARTIAL COLECTOMY N/A 10/10/2016   Procedure: PARTIAL COLECTOMY;  Surgeon: Aviva Signs, MD;  Location: [facility];  Service: General;  Laterality: N/A;   planter warts Bilateral    both feet   POLYPECTOMY  09/23/2016   Procedure: POLYPECTOMY;  Surgeon: Danie Binder, MD;  [facility];  Service: Endoscopy;;  transverse colon polyps times 2, rectal polyp   PORTACATH PLACEMENT Left 01/08/2022   Procedure: INSERTION PORT-A-CATH;  Surgeon: Aviva Signs, MD;  Location: [facility];  Service: General;  Laterality: Left;   skin grafts     due to planter  warts   TOTAL KNEE ARTHROPLASTY Right 05/30/2015   Procedure: RIGHT TOTAL KNEE ARTHROPLASTY;  Surgeon: Ninetta Lights, MD;  Location: [place];  Service: Orthopedics;  Laterality: Right;    Prior to Admission medications   Medication Sig Start Date End Date Taking? Authorizing Provider  acetaminophen (TYLENOL) 500 MG tablet Take 500 mg by mouth every 6 (six) hours as needed for moderate pain.   Yes [provider]  Calcium Carbonate-Vitamin D3 600-400 MG-UNIT TABS Take 1 tablet by mouth daily.   Yes [provider]  carvedilol (COREG) 6.25 MG tablet Take 6.25 mg by mouth 2 (two) times daily with a meal.   Yes [provider]  cholecalciferol (VITAMIN D3) 25 MCG (1000 UNIT) tablet Take 1,000 Units by mouth daily.   Yes [provider]  folic acid (FOLVITE) 1 MG tablet Take 1 mg by mouth daily.   Yes [provider]  furosemide (LASIX) 20 MG tablet Take 20 mg by mouth daily as needed for edema. 07/24/19  Yes [provider]  gabapentin (NEURONTIN) 600 MG tablet Take 600 mg by mouth 3 (three) times daily as needed (pain). 09/03/21  Yes [provider]  glucosamine-chondroitin 500-400 MG tablet Take 2 tablets by mouth daily.   Yes [provider]  lidocaine-prilocaine (EMLA) cream Apply to affected area once 12/26/21  Yes Derek Jack, MD  loperamide (IMODIUM) 2 MG capsule Take 2 mg by mouth as needed for diarrhea or loose stools.   Yes [provider]  magnesium oxide (MAG-OX) 400 (240 Mg) MG tablet Take 1 tablet (400 mg total) by mouth 3 (three) times daily. 01/30/22  Yes Derek Jack, MD  methotrexate (RHEUMATREX) 2.5 MG tablet Take 20 mg by mouth every Sunday. Caution:Chemotherapy. Protect from light.   Yes [provider]  NOVOLOG MIX 70/30 FLEXPEN (70-30) 100 UNIT/ML FlexPen Inject 20-30 Units into the skin See admin instructions. Inject 30 units into the skin in the morning and inject 20 units in  the evening 09/14/16  Yes [provider]  omeprazole (PRILOSEC) 40 MG capsule Take 40 mg by mouth daily.  11/17/16  Yes [provider]  prochlorperazine (COMPAZINE) 10 MG tablet Take 1 tablet (10 mg total) by mouth every 6 (six) hours as needed for nausea or vomiting. 12/26/21  Yes Derek Jack, MD  pyridoxine (B-6) 100 MG tablet Take 100 mg by mouth daily.   Yes [provider]  rivaroxaban (XARELTO) 20 MG TABS tablet Take 1 tablet (20 mg total) by mouth daily with supper. 01/27/22  Yes Derek Jack, MD  rOPINIRole (REQUIP) 1 MG tablet Take 1 mg by mouth 3 (three) times daily as needed (restless leg).   Yes [provider]  traMADol (  ULTRAM) 50 MG tablet Take 1 tablet (50 mg total) by mouth every 6 (six) hours as needed. 01/08/22  Yes Aviva Signs, MD  vitamin C (ASCORBIC ACID) 500 MG tablet Take 500 mg by mouth daily.   Yes [provider]  Atezolizumab (TECENTRIQ IV) Inject into the vein every 21 ( twenty-one) days. 12/26/21   [provider]  Bevacizumab (AVASTIN IV) Inject into the vein every 21 ( twenty-one) days. 12/26/21   [provider]  Glucagon (GVOKE HYPOPEN 2-PACK) 1 MG/0.2ML SOAJ Inject 0.2 mLs into the skin as needed (Blood glucose less than 55). 11/08/21   Lindell Spar, MD  insulin degludec (TRESIBA FLEXTOUCH) 100 UNIT/ML FlexTouch Pen Inject 40 Units into the skin at bedtime. 11/08/21   Lindell Spar, MD    Allergies as of 01/30/2022 - Review Complete 01/30/2022  Allergen Reaction Noted   Xeloda [capecitabine] Other (See Comments) 12/11/2020   Adhesive [tape]  05/16/2015   Neosporin original [bacitracin-neomycin-polymyxin] Other (See Comments) 11/29/2020   Sulfa antibiotics Nausea Only and Other (See Comments) 05/30/2015   Erythromycin Itching and Rash 05/16/2015    Family History  Problem Relation Age of Onset   Breast cancer Mother    Breast cancer Daughter    Colon cancer Neg Hx     Social  History   Socioeconomic History   Marital status: Widowed    Spouse name: Not on file   Number of children: Not on file   Years of education: Not on file   Highest education level: Not on file  Occupational History   Not on file  Tobacco Use   Smoking status: Former    Packs/day: 1.00    Years: 40.00    Total pack years: 40.00    Types: Cigarettes    Quit date: 08/18/2004    Years since quitting: 17.4   Smokeless tobacco: Never  Vaping Use   Vaping Use: Never used  Substance and Sexual Activity   Alcohol use: No   Drug use: No   Sexual activity: Not Currently    Birth control/protection: Surgical  Other Topics Concern   Not on file  Social History Narrative   Not on file   Social Determinants of Health   Financial Resource Strain: Low Risk  (01/29/2022)   Overall Financial Resource Strain (CARDIA)    Difficulty of Paying Living Expenses: Not hard at all  Food Insecurity: No Food Insecurity (01/29/2022)   Hunger Vital Sign    Worried About Running Out of Food in the Last Year: Never true    [place] in the Last Year: Never true  Transportation Needs: No Transportation Needs (01/29/2022)   PRAPARE - Hydrologist (Medical): No    Lack of Transportation (Non-Medical): No  Physical Activity: Insufficiently Active (01/29/2022)   Exercise Vital Sign    Days of Exercise per Week: 7 days    Minutes of Exercise per Session: 20 min  Stress: No Stress Concern Present (01/29/2022)   [place]    Feeling of Stress : Not at all  Social Connections: Moderately Isolated (01/29/2022)   Social Connection and Isolation Panel [NHANES]    Frequency of Communication with Friends and Family: More than three times a week    Frequency of Social Gatherings with Friends and Family: More than three times a week    Attends Religious Services: 1 to 4 times per year  Active Member of Clubs or  Organizations: No    Attends Archivist Meetings: Never    Marital Status: Widowed  Intimate Partner Violence: Not At Risk (01/29/2022)   Humiliation, Afraid, Rape, and Kick questionnaire    Fear of Current or Ex-Partner: No    Emotionally Abused: No    Physically Abused: No    Sexually Abused: No    Review of Systems: See HPI, otherwise negative ROS  Physical Exam: BP (!) 163/66   Temp 98.1 F (36.7 C) (Oral)   Resp 20   SpO2 100%  General:   Alert,  Well-developed, well-nourished, pleasant and cooperative in NAD  Lungs:  Clear throughout to auscultation.   No wheezes, crackles, or rhonchi. No acute distress. Heart:  Regular rate and rhythm; no murmurs, clicks, rubs,  or gallops. Abdomen: Non-distended, normal bowel sounds.  Soft and nontender without appreciable mass or hepatosplenomegaly.  Pulses:  Normal pulses noted. Extremities:  Without clubbing or edema.  Impression/Plan: 76 year old lady with cirrhosis complicated by hepatocellular carcinoma.  Initiating therapy atezolizumab and bevacizumab.  Increased risk of upper GI bleeding with these agents.  I have offered the patient a screening EGD.  Potential for esophageal banding reviewed. The risks, benefits, limitations, alternatives and imponderables have been reviewed with the patient. Potential for esophageal dilation, biopsy, etc. have also been reviewed.  Questions have been answered. All parties agreeable.      Notice: This dictation was prepared with Dragon dictation along with smaller phrase technology. Any transcriptional errors that result from this process are unintentional and may not be corrected upon review.

## 2022-02-06 NOTE — Anesthesia Procedure Notes (Signed)
Date/Time: 02/06/2022 12:52 PM  Performed by: Vista Deck, CRNAPre-anesthesia Checklist: Patient identified, Emergency Drugs available, Suction available, Timeout performed and Patient being monitored Patient Re-evaluated:Patient Re-evaluated prior to induction Oxygen Delivery Method: Nasal Cannula

## 2022-02-06 NOTE — Transfer of Care (Signed)
Immediate Anesthesia Transfer of Care Note  Patient: TAMI BLASS  Procedure(s) Performed: ESOPHAGOGASTRODUODENOSCOPY (EGD) WITH PROPOFOL BIOPSY ESOPHAGEAL BANDING  Patient Location: Short Stay  Anesthesia Type:General  Level of Consciousness: awake  Airway & Oxygen Therapy: Patient Spontanous Breathing  Post-op Assessment: Report given to RN and Post -op Vital signs reviewed and stable  Post vital signs: Reviewed and stable136/50  Last Vitals:  Vitals Value Taken Time  BP 136/50 1317  Temp 98 1317  Pulse 96 1317  Resp 20 1317  SpO2 100 1317    Last Pain:  Vitals:   02/06/22 1253  TempSrc:   PainSc: 0-No pain      Patients Stated Pain Goal: 8 (91/63/84 6659)  Complications: No notable events documented.

## 2022-02-07 ENCOUNTER — Inpatient Hospital Stay (HOSPITAL_COMMUNITY)
Admission: EM | Admit: 2022-02-07 | Discharge: 2022-02-14 | DRG: 391 | Disposition: A | Discharge status: Home Health | Payer: Medicare Other | Attending: Internal Medicine | Admitting: Internal Medicine

## 2022-02-07 ENCOUNTER — Emergency Department (HOSPITAL_COMMUNITY): Payer: Medicare Other

## 2022-02-07 ENCOUNTER — Encounter: Payer: Self-pay | Admitting: Internal Medicine

## 2022-02-07 ENCOUNTER — Other Ambulatory Visit: Payer: Self-pay

## 2022-02-07 ENCOUNTER — Telehealth: Payer: Self-pay | Admitting: Internal Medicine

## 2022-02-07 ENCOUNTER — Encounter (HOSPITAL_COMMUNITY): Payer: Self-pay

## 2022-02-07 DIAGNOSIS — K3189 Other diseases of stomach and duodenum: Secondary | ICD-10-CM | POA: Diagnosis present

## 2022-02-07 DIAGNOSIS — E119 Type 2 diabetes mellitus without complications: Secondary | ICD-10-CM

## 2022-02-07 DIAGNOSIS — R6510 Systemic inflammatory response syndrome (SIRS) of non-infectious origin without acute organ dysfunction: Secondary | ICD-10-CM | POA: Diagnosis not present

## 2022-02-07 DIAGNOSIS — C189 Malignant neoplasm of colon, unspecified: Secondary | ICD-10-CM | POA: Diagnosis present

## 2022-02-07 DIAGNOSIS — Z794 Long term (current) use of insulin: Secondary | ICD-10-CM

## 2022-02-07 DIAGNOSIS — K7682 Hepatic encephalopathy: Secondary | ICD-10-CM | POA: Diagnosis present

## 2022-02-07 DIAGNOSIS — K7031 Alcoholic cirrhosis of liver with ascites: Secondary | ICD-10-CM | POA: Diagnosis present

## 2022-02-07 DIAGNOSIS — R188 Other ascites: Secondary | ICD-10-CM

## 2022-02-07 DIAGNOSIS — Z9049 Acquired absence of other specified parts of digestive tract: Secondary | ICD-10-CM

## 2022-02-07 DIAGNOSIS — K259 Gastric ulcer, unspecified as acute or chronic, without hemorrhage or perforation: Secondary | ICD-10-CM | POA: Diagnosis present

## 2022-02-07 DIAGNOSIS — Z888 Allergy status to other drugs, medicaments and biological substances status: Secondary | ICD-10-CM

## 2022-02-07 DIAGNOSIS — R791 Abnormal coagulation profile: Secondary | ICD-10-CM | POA: Diagnosis present

## 2022-02-07 DIAGNOSIS — K529 Noninfective gastroenteritis and colitis, unspecified: Secondary | ICD-10-CM | POA: Diagnosis present

## 2022-02-07 DIAGNOSIS — Z881 Allergy status to other antibiotic agents status: Secondary | ICD-10-CM

## 2022-02-07 DIAGNOSIS — Z79899 Other long term (current) drug therapy: Secondary | ICD-10-CM

## 2022-02-07 DIAGNOSIS — M797 Fibromyalgia: Secondary | ICD-10-CM | POA: Diagnosis present

## 2022-02-07 DIAGNOSIS — Z7901 Long term (current) use of anticoagulants: Secondary | ICD-10-CM

## 2022-02-07 DIAGNOSIS — F32A Depression, unspecified: Secondary | ICD-10-CM | POA: Diagnosis present

## 2022-02-07 DIAGNOSIS — I8510 Secondary esophageal varices without bleeding: Secondary | ICD-10-CM | POA: Diagnosis present

## 2022-02-07 DIAGNOSIS — Z85828 Personal history of other malignant neoplasm of skin: Secondary | ICD-10-CM

## 2022-02-07 DIAGNOSIS — R600 Localized edema: Secondary | ICD-10-CM | POA: Diagnosis present

## 2022-02-07 DIAGNOSIS — M069 Rheumatoid arthritis, unspecified: Secondary | ICD-10-CM | POA: Diagnosis present

## 2022-02-07 DIAGNOSIS — I4891 Unspecified atrial fibrillation: Secondary | ICD-10-CM | POA: Diagnosis present

## 2022-02-07 DIAGNOSIS — I10 Essential (primary) hypertension: Secondary | ICD-10-CM | POA: Diagnosis present

## 2022-02-07 DIAGNOSIS — A419 Sepsis, unspecified organism: Secondary | ICD-10-CM | POA: Diagnosis not present

## 2022-02-07 DIAGNOSIS — E1140 Type 2 diabetes mellitus with diabetic neuropathy, unspecified: Secondary | ICD-10-CM | POA: Diagnosis present

## 2022-02-07 DIAGNOSIS — K224 Dyskinesia of esophagus: Principal | ICD-10-CM | POA: Diagnosis present

## 2022-02-07 DIAGNOSIS — R112 Nausea with vomiting, unspecified: Secondary | ICD-10-CM | POA: Diagnosis present

## 2022-02-07 DIAGNOSIS — N183 Chronic kidney disease, stage 3 unspecified: Secondary | ICD-10-CM | POA: Diagnosis present

## 2022-02-07 DIAGNOSIS — J9601 Acute respiratory failure with hypoxia: Secondary | ICD-10-CM | POA: Diagnosis not present

## 2022-02-07 DIAGNOSIS — E785 Hyperlipidemia, unspecified: Secondary | ICD-10-CM | POA: Diagnosis present

## 2022-02-07 DIAGNOSIS — E876 Hypokalemia: Secondary | ICD-10-CM | POA: Diagnosis not present

## 2022-02-07 DIAGNOSIS — Z86711 Personal history of pulmonary embolism: Secondary | ICD-10-CM

## 2022-02-07 DIAGNOSIS — K449 Diaphragmatic hernia without obstruction or gangrene: Secondary | ICD-10-CM | POA: Diagnosis present

## 2022-02-07 DIAGNOSIS — D509 Iron deficiency anemia, unspecified: Secondary | ICD-10-CM | POA: Diagnosis present

## 2022-02-07 DIAGNOSIS — Z803 Family history of malignant neoplasm of breast: Secondary | ICD-10-CM

## 2022-02-07 DIAGNOSIS — Z96651 Presence of right artificial knee joint: Secondary | ICD-10-CM | POA: Diagnosis present

## 2022-02-07 DIAGNOSIS — G2581 Restless legs syndrome: Secondary | ICD-10-CM | POA: Diagnosis present

## 2022-02-07 DIAGNOSIS — C229 Malignant neoplasm of liver, not specified as primary or secondary: Secondary | ICD-10-CM

## 2022-02-07 DIAGNOSIS — R300 Dysuria: Secondary | ICD-10-CM | POA: Diagnosis present

## 2022-02-07 DIAGNOSIS — D649 Anemia, unspecified: Secondary | ICD-10-CM | POA: Diagnosis present

## 2022-02-07 DIAGNOSIS — R1310 Dysphagia, unspecified: Secondary | ICD-10-CM

## 2022-02-07 DIAGNOSIS — C220 Liver cell carcinoma: Secondary | ICD-10-CM | POA: Diagnosis present

## 2022-02-07 DIAGNOSIS — N390 Urinary tract infection, site not specified: Secondary | ICD-10-CM | POA: Diagnosis present

## 2022-02-07 DIAGNOSIS — J690 Pneumonitis due to inhalation of food and vomit: Secondary | ICD-10-CM | POA: Diagnosis not present

## 2022-02-07 DIAGNOSIS — K766 Portal hypertension: Secondary | ICD-10-CM | POA: Diagnosis present

## 2022-02-07 DIAGNOSIS — Z87891 Personal history of nicotine dependence: Secondary | ICD-10-CM

## 2022-02-07 DIAGNOSIS — Z86718 Personal history of other venous thrombosis and embolism: Secondary | ICD-10-CM

## 2022-02-07 DIAGNOSIS — K219 Gastro-esophageal reflux disease without esophagitis: Secondary | ICD-10-CM | POA: Diagnosis present

## 2022-02-07 DIAGNOSIS — Z90710 Acquired absence of both cervix and uterus: Secondary | ICD-10-CM

## 2022-02-07 LAB — CBC WITH DIFFERENTIAL/PLATELET
Abs Immature Granulocytes: 0.02 10*3/uL (ref 0.00–0.07)
Basophils Absolute: 0 10*3/uL (ref 0.0–0.1)
Basophils Relative: 0 %
Eosinophils Absolute: 0.1 10*3/uL (ref 0.0–0.5)
Eosinophils Relative: 1 %
HCT: 24.3 % — ABNORMAL LOW (ref 36.0–46.0)
Hemoglobin: 7.6 g/dL — ABNORMAL LOW (ref 12.0–15.0)
Immature Granulocytes: 0 %
Lymphocytes Relative: 13 %
Lymphs Abs: 0.6 10*3/uL — ABNORMAL LOW (ref 0.7–4.0)
MCH: 28.3 pg (ref 26.0–34.0)
MCHC: 31.3 g/dL (ref 30.0–36.0)
MCV: 90.3 fL (ref 80.0–100.0)
Monocytes Absolute: 0.5 10*3/uL (ref 0.1–1.0)
Monocytes Relative: 10 %
Neutro Abs: 3.9 10*3/uL (ref 1.7–7.7)
Neutrophils Relative %: 76 %
Platelets: 136 10*3/uL — ABNORMAL LOW (ref 150–400)
RBC: 2.69 MIL/uL — ABNORMAL LOW (ref 3.87–5.11)
RDW: 17.1 % — ABNORMAL HIGH (ref 11.5–15.5)
WBC: 5.1 10*3/uL (ref 4.0–10.5)
nRBC: 0 % (ref 0.0–0.2)

## 2022-02-07 LAB — COMPREHENSIVE METABOLIC PANEL
ALT: 23 U/L (ref 0–44)
AST: 33 U/L (ref 15–41)
Albumin: 2.3 g/dL — ABNORMAL LOW (ref 3.5–5.0)
Alkaline Phosphatase: 69 U/L (ref 38–126)
Anion gap: 6 (ref 5–15)
BUN: 13 mg/dL (ref 8–23)
CO2: 24 mmol/L (ref 22–32)
Calcium: 8.2 mg/dL — ABNORMAL LOW (ref 8.9–10.3)
Chloride: 109 mmol/L (ref 98–111)
Creatinine, Ser: 0.77 mg/dL (ref 0.44–1.00)
GFR, Estimated: >60 mL/min (ref >60)
Glucose, Bld: 153 mg/dL — ABNORMAL HIGH (ref 70–99)
Potassium: 3.2 mmol/L — ABNORMAL LOW (ref 3.5–5.1)
Sodium: 139 mmol/L (ref 135–145)
Total Bilirubin: 1.3 mg/dL — ABNORMAL HIGH (ref 0.3–1.2)
Total Protein: 5.8 g/dL — ABNORMAL LOW (ref 6.5–8.1)

## 2022-02-07 LAB — GLUCOSE, CAPILLARY
Glucose-Capillary: 140 mg/dL — ABNORMAL HIGH (ref 70–99)
Glucose-Capillary: 155 mg/dL — ABNORMAL HIGH (ref 70–99)

## 2022-02-07 LAB — HEMOGLOBIN A1C
Hgb A1c MFr Bld: 6.8 % — ABNORMAL HIGH (ref 4.8–5.6)
Mean Plasma Glucose: 148.46 mg/dL

## 2022-02-07 LAB — LIPASE, BLOOD: Lipase: 26 U/L (ref 11–51)

## 2022-02-07 LAB — SURGICAL PATHOLOGY

## 2022-02-07 MED ORDER — TRAZODONE HCL 50 MG PO TABS: 25.0000 mg | ORAL_TABLET | Freq: Every evening | ORAL | Status: DC | PRN

## 2022-02-07 MED ORDER — OXYCODONE HCL 5 MG PO TABS
5.0000 mg | ORAL_TABLET | ORAL | Status: DC | PRN
  Administered 2022-02-12: 5 mg via ORAL
  Filled 2022-02-07 (×2): qty 1

## 2022-02-07 MED ORDER — PANTOPRAZOLE SODIUM 40 MG IV SOLR
40.0000 mg | Freq: Two times a day (BID) | INTRAVENOUS | Status: DC
  Administered 2022-02-07 – 2022-02-14 (×14): 40 mg via INTRAVENOUS
  Filled 2022-02-07 (×14): qty 10

## 2022-02-07 MED ORDER — POTASSIUM CHLORIDE 2 MEQ/ML IV SOLN
INTRAVENOUS | Status: DC
  Filled 2022-02-07 (×3): qty 1000

## 2022-02-07 MED ORDER — ACETAMINOPHEN 650 MG RE SUPP
650.0000 mg | Freq: Four times a day (QID) | RECTAL | Status: DC | PRN
  Administered 2022-02-10: 650 mg via RECTAL
  Filled 2022-02-07: qty 1

## 2022-02-07 MED ORDER — IPRATROPIUM BROMIDE 0.02 % IN SOLN
0.5000 mg | Freq: Four times a day (QID) | RESPIRATORY_TRACT | Status: DC | PRN
Start: 2022-02-07 — End: 2022-02-14

## 2022-02-07 MED ORDER — HYDRALAZINE HCL 20 MG/ML IJ SOLN: 10.0000 mg | INTRAMUSCULAR | Status: DC | PRN

## 2022-02-07 MED ORDER — ACETAMINOPHEN 325 MG PO TABS
650.0000 mg | ORAL_TABLET | Freq: Four times a day (QID) | ORAL | Status: DC | PRN
  Administered 2022-02-08: 650 mg via ORAL
  Filled 2022-02-07: qty 2

## 2022-02-07 MED ORDER — GABAPENTIN 300 MG PO CAPS: 600.0000 mg | ORAL_CAPSULE | Freq: Three times a day (TID) | ORAL | Status: DC | PRN

## 2022-02-07 MED ORDER — SODIUM CHLORIDE 0.9 % IV SOLN: INTRAVENOUS | Status: DC

## 2022-02-07 MED ORDER — SENNOSIDES-DOCUSATE SODIUM 8.6-50 MG PO TABS: 1.0000 | ORAL_TABLET | Freq: Every evening | ORAL | Status: DC | PRN

## 2022-02-07 MED ORDER — ROPINIROLE HCL 1 MG PO TABS: 1.0000 mg | ORAL_TABLET | Freq: Three times a day (TID) | ORAL | Status: DC | PRN

## 2022-02-07 MED ORDER — BISACODYL 5 MG PO TBEC: 5.0000 mg | DELAYED_RELEASE_TABLET | Freq: Every day | ORAL | Status: DC | PRN

## 2022-02-07 MED ORDER — SODIUM CHLORIDE 0.9% FLUSH
3.0000 mL | Freq: Two times a day (BID) | INTRAVENOUS | Status: DC
Start: 2022-02-07 — End: 2022-02-09
  Administered 2022-02-07 – 2022-02-09 (×5): 3 mL via INTRAVENOUS

## 2022-02-07 MED ORDER — SODIUM CHLORIDE 0.9 % IV SOLN
300.0000 mg | INTRAVENOUS | Status: DC
  Filled 2022-02-07 (×2): qty 15

## 2022-02-07 MED ORDER — SODIUM CHLORIDE 0.9% FLUSH: 3.0000 mL | INTRAVENOUS | Status: DC | PRN

## 2022-02-07 MED ORDER — FOLIC ACID 1 MG PO TABS
1.0000 mg | ORAL_TABLET | ORAL | Status: DC
  Administered 2022-02-08 – 2022-02-14 (×3): 1 mg via ORAL
  Filled 2022-02-07 (×5): qty 1

## 2022-02-07 MED ORDER — RIVAROXABAN 20 MG PO TABS
20.0000 mg | ORAL_TABLET | Freq: Every day | ORAL | Status: DC
Start: 2022-02-07 — End: 2022-02-14
  Administered 2022-02-08 – 2022-02-13 (×6): 20 mg via ORAL
  Filled 2022-02-07 (×7): qty 1

## 2022-02-07 MED ORDER — SODIUM CHLORIDE 0.9 % IV SOLN
250.0000 mL | INTRAVENOUS | Status: DC | PRN
  Administered 2022-02-08: 250 mL via INTRAVENOUS

## 2022-02-07 MED ORDER — SODIUM CHLORIDE 0.9 % IV BOLUS
500.0000 mL | Freq: Once | INTRAVENOUS | Status: AC
  Administered 2022-02-07: 500 mL via INTRAVENOUS

## 2022-02-07 MED ORDER — ONDANSETRON HCL 4 MG/2ML IJ SOLN
4.0000 mg | Freq: Once | INTRAMUSCULAR | Status: AC
Start: 2022-02-07 — End: 2022-02-07
  Administered 2022-02-07: 4 mg via INTRAVENOUS
  Filled 2022-02-07: qty 2

## 2022-02-07 MED ORDER — INSULIN ASPART 100 UNIT/ML IJ SOLN
0.0000 [IU] | Freq: Three times a day (TID) | INTRAMUSCULAR | Status: DC
  Administered 2022-02-08 – 2022-02-12 (×7): 1 [IU] via SUBCUTANEOUS

## 2022-02-07 MED ORDER — ONDANSETRON HCL 4 MG PO TABS: 4.0000 mg | ORAL_TABLET | Freq: Four times a day (QID) | ORAL | Status: DC | PRN

## 2022-02-07 MED ORDER — LEVALBUTEROL HCL 0.63 MG/3ML IN NEBU
0.6300 mg | INHALATION_SOLUTION | Freq: Four times a day (QID) | RESPIRATORY_TRACT | Status: DC | PRN
Start: 2022-02-07 — End: 2022-02-14

## 2022-02-07 MED ORDER — PANTOPRAZOLE SODIUM 40 MG IV SOLR: 40.0000 mg | Freq: Every day | INTRAVENOUS | Status: DC

## 2022-02-07 MED ORDER — SODIUM CHLORIDE 0.9% FLUSH
3.0000 mL | Freq: Two times a day (BID) | INTRAVENOUS | Status: DC
  Administered 2022-02-07 – 2022-02-14 (×12): 3 mL via INTRAVENOUS

## 2022-02-07 MED ORDER — CARVEDILOL 3.125 MG PO TABS
6.2500 mg | ORAL_TABLET | Freq: Two times a day (BID) | ORAL | Status: DC
Start: 2022-02-07 — End: 2022-02-09
  Administered 2022-02-08 – 2022-02-09 (×3): 6.25 mg via ORAL
  Filled 2022-02-07 (×4): qty 2

## 2022-02-07 MED ORDER — HYDROMORPHONE HCL 1 MG/ML IJ SOLN: 0.5000 mg | INTRAMUSCULAR | Status: DC | PRN

## 2022-02-07 MED ORDER — METHOTREXATE 2.5 MG PO TABS
20.0000 mg | ORAL_TABLET | ORAL | Status: DC
  Filled 2022-02-07: qty 8

## 2022-02-07 MED ORDER — SODIUM CHLORIDE 0.9 % IV SOLN
125.0000 mg | Freq: Once | INTRAVENOUS | Status: AC
  Administered 2022-02-07: 125 mg via INTRAVENOUS
  Filled 2022-02-07: qty 10

## 2022-02-07 MED ORDER — METOCLOPRAMIDE HCL 5 MG/ML IJ SOLN
10.0000 mg | Freq: Three times a day (TID) | INTRAMUSCULAR | Status: DC
  Administered 2022-02-07 – 2022-02-09 (×6): 10 mg via INTRAVENOUS
  Filled 2022-02-07 (×6): qty 2

## 2022-02-07 MED ORDER — ONDANSETRON HCL 4 MG/2ML IJ SOLN
4.0000 mg | Freq: Four times a day (QID) | INTRAMUSCULAR | Status: DC | PRN
  Administered 2022-02-09: 4 mg via INTRAVENOUS
  Filled 2022-02-07: qty 2

## 2022-02-07 MED ORDER — HEPARIN SODIUM (PORCINE) 5000 UNIT/ML IJ SOLN: 5000.0000 [IU] | Freq: Three times a day (TID) | INTRAMUSCULAR | Status: DC

## 2022-02-07 MED ORDER — KCL IN DEXTROSE-NACL 20-5-0.45 MEQ/L-%-% IV SOLN
INTRAVENOUS | Status: DC
  Filled 2022-02-07 (×3): qty 1000

## 2022-02-07 MED ORDER — INSULIN ASPART PROT & ASPART (70-30 MIX) 100 UNIT/ML PEN: 20.0000 [IU] | PEN_INJECTOR | SUBCUTANEOUS | Status: DC

## 2022-02-07 NOTE — Assessment & Plan Note (Addendum)
-   Acute on chronic anemia With iron deficiency -Status post banding x3 of esophageal varices    Latest Ref Rng & Units 02/11/2022    3:09 AM 02/10/2022    4:33 AM 02/09/2022    4:30 AM  CBC  WBC 4.0 - 10.5 K/uL 5.7  6.9  12.9   Hemoglobin 12.0 - 15.0 g/dL 7.6  7.4  8.7   Hematocrit 36.0 - 46.0 % 25.4  24.0  27.9   Platelets 150 - 400 K/uL 115  122  137      - IV iron 300 mg daily x3 Once tolerating p.o. we will initiate p.o. iron supplements Continue supplement folic acid  -Drop in hemoglobin 6.7--symptomatic with tachycardia -Status post 2U PRBC transfusion 02/08/2022  -Monitoring closely

## 2022-02-07 NOTE — Telephone Encounter (Signed)
Pt states that there is some improvement since last night, but that she hasn't tried to eat anything as of yet this morning.

## 2022-02-07 NOTE — Progress Notes (Signed)
  Transition of Care [facility]) Screening Note   Patient Details  Name: Wanda Curry Date of Birth: February 12, 1946   Transition of Care [facility]) CM/SW Contact:    Villa Herb, LCSWA Phone Number: 02/07/2022, 2:57 PM  TOC consulted for HH/DME/PCP needs. Per chart review pts PCP is Dr. Allena Katz. PT/OT has been ordered. TOC will follow up on discharge needs with PT/OT recommendations.   Transition of Care Department [facility]) has reviewed patient and no TOC needs have been identified at this time. We will continue to monitor patient advancement through interdisciplinary progression rounds. If new patient transition needs arise, please place a TOC consult.

## 2022-02-07 NOTE — Progress Notes (Signed)
Pts evening medications have been held due to Pt vomiting and unable to receive nausea medication at this time. MD notified. No new orders at this time.

## 2022-02-07 NOTE — Telephone Encounter (Signed)
Pt's daughter called back stating that her mother is unable to keep any liquids or nausea meds down. Daughter is worried that pt will become dehydrated.

## 2022-02-08 ENCOUNTER — Inpatient Hospital Stay (HOSPITAL_COMMUNITY): Payer: Medicare Other

## 2022-02-08 DIAGNOSIS — K766 Portal hypertension: Secondary | ICD-10-CM | POA: Diagnosis present

## 2022-02-08 DIAGNOSIS — A419 Sepsis, unspecified organism: Secondary | ICD-10-CM | POA: Diagnosis not present

## 2022-02-08 DIAGNOSIS — C220 Liver cell carcinoma: Secondary | ICD-10-CM | POA: Diagnosis present

## 2022-02-08 DIAGNOSIS — N390 Urinary tract infection, site not specified: Secondary | ICD-10-CM | POA: Diagnosis present

## 2022-02-08 DIAGNOSIS — Z86711 Personal history of pulmonary embolism: Secondary | ICD-10-CM | POA: Diagnosis not present

## 2022-02-08 DIAGNOSIS — R1110 Vomiting, unspecified: Secondary | ICD-10-CM

## 2022-02-08 DIAGNOSIS — C189 Malignant neoplasm of colon, unspecified: Secondary | ICD-10-CM | POA: Diagnosis present

## 2022-02-08 DIAGNOSIS — R1319 Other dysphagia: Secondary | ICD-10-CM | POA: Diagnosis not present

## 2022-02-08 DIAGNOSIS — E1140 Type 2 diabetes mellitus with diabetic neuropathy, unspecified: Secondary | ICD-10-CM | POA: Diagnosis present

## 2022-02-08 DIAGNOSIS — Z86718 Personal history of other venous thrombosis and embolism: Secondary | ICD-10-CM | POA: Diagnosis not present

## 2022-02-08 DIAGNOSIS — K224 Dyskinesia of esophagus: Secondary | ICD-10-CM | POA: Diagnosis present

## 2022-02-08 DIAGNOSIS — F32A Depression, unspecified: Secondary | ICD-10-CM | POA: Diagnosis present

## 2022-02-08 DIAGNOSIS — K529 Noninfective gastroenteritis and colitis, unspecified: Secondary | ICD-10-CM | POA: Diagnosis present

## 2022-02-08 DIAGNOSIS — J9601 Acute respiratory failure with hypoxia: Secondary | ICD-10-CM | POA: Diagnosis not present

## 2022-02-08 DIAGNOSIS — R188 Other ascites: Secondary | ICD-10-CM | POA: Diagnosis not present

## 2022-02-08 DIAGNOSIS — D649 Anemia, unspecified: Secondary | ICD-10-CM | POA: Diagnosis present

## 2022-02-08 DIAGNOSIS — K7460 Unspecified cirrhosis of liver: Secondary | ICD-10-CM | POA: Diagnosis not present

## 2022-02-08 DIAGNOSIS — I8510 Secondary esophageal varices without bleeding: Secondary | ICD-10-CM | POA: Diagnosis present

## 2022-02-08 DIAGNOSIS — D509 Iron deficiency anemia, unspecified: Secondary | ICD-10-CM | POA: Diagnosis present

## 2022-02-08 DIAGNOSIS — J690 Pneumonitis due to inhalation of food and vomit: Secondary | ICD-10-CM | POA: Diagnosis not present

## 2022-02-08 DIAGNOSIS — R112 Nausea with vomiting, unspecified: Secondary | ICD-10-CM | POA: Diagnosis present

## 2022-02-08 DIAGNOSIS — K219 Gastro-esophageal reflux disease without esophagitis: Secondary | ICD-10-CM | POA: Diagnosis present

## 2022-02-08 DIAGNOSIS — G2581 Restless legs syndrome: Secondary | ICD-10-CM | POA: Diagnosis present

## 2022-02-08 DIAGNOSIS — I10 Essential (primary) hypertension: Secondary | ICD-10-CM | POA: Diagnosis present

## 2022-02-08 DIAGNOSIS — K7682 Hepatic encephalopathy: Secondary | ICD-10-CM | POA: Diagnosis present

## 2022-02-08 DIAGNOSIS — K449 Diaphragmatic hernia without obstruction or gangrene: Secondary | ICD-10-CM | POA: Diagnosis present

## 2022-02-08 DIAGNOSIS — M069 Rheumatoid arthritis, unspecified: Secondary | ICD-10-CM | POA: Diagnosis present

## 2022-02-08 DIAGNOSIS — K3189 Other diseases of stomach and duodenum: Secondary | ICD-10-CM | POA: Diagnosis present

## 2022-02-08 DIAGNOSIS — K259 Gastric ulcer, unspecified as acute or chronic, without hemorrhage or perforation: Secondary | ICD-10-CM | POA: Diagnosis present

## 2022-02-08 DIAGNOSIS — K7031 Alcoholic cirrhosis of liver with ascites: Secondary | ICD-10-CM | POA: Diagnosis present

## 2022-02-08 LAB — CBC
HCT: 21.9 % — ABNORMAL LOW (ref 36.0–46.0)
HCT: 27.3 % — ABNORMAL LOW (ref 36.0–46.0)
Hemoglobin: 6.7 g/dL — CL (ref 12.0–15.0)
Hemoglobin: 8.5 g/dL — ABNORMAL LOW (ref 12.0–15.0)
MCH: 28.5 pg (ref 26.0–34.0)
MCH: 28.1 pg (ref 26.0–34.0)
MCHC: 30.6 g/dL (ref 30.0–36.0)
MCHC: 31.1 g/dL (ref 30.0–36.0)
MCV: 93.2 fL (ref 80.0–100.0)
MCV: 90.1 fL (ref 80.0–100.0)
Platelets: 125 10*3/uL — ABNORMAL LOW (ref 150–400)
Platelets: 126 10*3/uL — ABNORMAL LOW (ref 150–400)
RBC: 2.35 MIL/uL — ABNORMAL LOW (ref 3.87–5.11)
RBC: 3.03 MIL/uL — ABNORMAL LOW (ref 3.87–5.11)
RDW: 17.7 % — ABNORMAL HIGH (ref 11.5–15.5)
RDW: 18 % — ABNORMAL HIGH (ref 11.5–15.5)
WBC: 6.2 10*3/uL (ref 4.0–10.5)
WBC: 6.7 10*3/uL (ref 4.0–10.5)
nRBC: 0 % (ref 0.0–0.2)
nRBC: 0 % (ref 0.0–0.2)

## 2022-02-08 LAB — GLUCOSE, CAPILLARY
Glucose-Capillary: 148 mg/dL — ABNORMAL HIGH (ref 70–99)
Glucose-Capillary: 158 mg/dL — ABNORMAL HIGH (ref 70–99)
Glucose-Capillary: 174 mg/dL — ABNORMAL HIGH (ref 70–99)
Glucose-Capillary: 160 mg/dL — ABNORMAL HIGH (ref 70–99)

## 2022-02-08 LAB — BASIC METABOLIC PANEL
Anion gap: 5 (ref 5–15)
BUN: 13 mg/dL (ref 8–23)
CO2: 25 mmol/L (ref 22–32)
Calcium: 8.1 mg/dL — ABNORMAL LOW (ref 8.9–10.3)
Chloride: 112 mmol/L — ABNORMAL HIGH (ref 98–111)
Creatinine, Ser: 0.77 mg/dL (ref 0.44–1.00)
GFR, Estimated: >60 mL/min (ref >60)
Glucose, Bld: 144 mg/dL — ABNORMAL HIGH (ref 70–99)
Potassium: 3.2 mmol/L — ABNORMAL LOW (ref 3.5–5.1)
Sodium: 142 mmol/L (ref 135–145)

## 2022-02-08 LAB — PREPARE RBC (CROSSMATCH)

## 2022-02-08 LAB — HEMOGLOBIN AND HEMATOCRIT, BLOOD
HCT: 23.7 % — ABNORMAL LOW (ref 36.0–46.0)
Hemoglobin: 7.3 g/dL — ABNORMAL LOW (ref 12.0–15.0)

## 2022-02-08 MED ORDER — CHLORHEXIDINE GLUCONATE CLOTH 2 % EX PADS
6.0000 | MEDICATED_PAD | Freq: Every day | CUTANEOUS | Status: DC
  Administered 2022-02-08 – 2022-02-14 (×7): 6 via TOPICAL

## 2022-02-08 MED ORDER — FUROSEMIDE 10 MG/ML IJ SOLN
20.0000 mg | Freq: Once | INTRAMUSCULAR | Status: AC
Start: 2022-02-08 — End: 2022-02-08
  Administered 2022-02-08: 20 mg via INTRAVENOUS
  Filled 2022-02-08: qty 2

## 2022-02-08 MED ORDER — FUROSEMIDE 10 MG/ML IJ SOLN: 20.0000 mg | Freq: Once | INTRAMUSCULAR | Status: DC

## 2022-02-08 MED ORDER — IOHEXOL 9 MG/ML PO SOLN
ORAL | Status: AC
  Filled 2022-02-08: qty 1000

## 2022-02-08 MED ORDER — IOHEXOL 9 MG/ML PO SOLN
500.0000 mL | ORAL | Status: AC
  Administered 2022-02-08 (×2): 500 mL via ORAL

## 2022-02-08 MED ORDER — IOHEXOL 300 MG/ML  SOLN
100.0000 mL | Freq: Once | INTRAMUSCULAR | Status: AC | PRN
  Administered 2022-02-08: 100 mL via INTRAVENOUS

## 2022-02-08 MED ORDER — SODIUM CHLORIDE 0.9% IV SOLUTION: Freq: Once | INTRAVENOUS | Status: AC

## 2022-02-08 NOTE — Assessment & Plan Note (Signed)
-   Chronic diarrhea, resuming her Imodium

## 2022-02-08 NOTE — Progress Notes (Signed)
PROGRESS NOTE    Patient: Wanda Curry                            PCP: Anabel Halon, MD                    DOB: 07/04/1946            DOA: 02/07/2022 JYN:[id number]             DOS: 02/08/2022, 11:15 AM   LOS: 0 days   Date of Service: The patient was seen and examined on 02/08/2022  Subjective:   The patient was seen and examined this morning. Stable but tachycardic, blood pressure mildly elevated, satting 93% on room air Denies of any chest pain or shortness of breath Still complaining of nausea and vomiting unable to tolerate solid or liquids  Noted for drop in her hemoglobin to 6.7 with patient and her daughter at bedside agreed with proceeding with blood transfusion.  Worsening lower extremity edema,   Brief Narrative:   Wanda Curry is a 76 year old female with past history of colon cancer, liver cancer, DM 2, HTN, CKD presenting with intractable nausea vomiting since yesterday.  Patient had an endoscopy by Dr. Jena Gauss yesterday 02/06/2022 for evaluation of possible varices.  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  Patient was subsequently discharged home.  Since procedure she has not been able to keep solids or liquids down, started having nausea and vomiting.  Vomitus has been nonbloody nonbilious.   She denies of having any abdominal pain, denies any increased abdominal distention, denies diarrhea.     ED: Blood pressure (!) 162/67, pulse 100, temperature 97.9 F (36.6 C), temperature source Tympanic, resp. rate 15, height 5' 5.35" (1.66 m), weight 99.8 kg, SpO2 99 %.    Latest Ref Rng & Units 02/07/2022   12:07 PM 02/04/2022   11:39 AM 01/30/2022    8:12 AM  CBC  WBC 4.0 - 10.5 K/uL 5.1  5.2  5.8   Hemoglobin 12.0 - 15.0 g/dL 7.6  8.4  7.7   Hematocrit 36.0 - 46.0 % 24.3  27.9  24.6   Platelets 150 - 400 K/uL 136  178  173       Latest Ref Rng & Units 02/07/2022   12:07 PM 01/30/2022    8:12 AM 12/26/2021     7:48 AM  CMP  Glucose 70 - 99 mg/dL 865  784  696   BUN 8 - 23 mg/dL 13  17  15    Creatinine 0.44 - 1.00 mg/dL 2.95  2.84  1.32   Sodium 135 - 145 mmol/L 139  136  135   Potassium 3.5 - 5.1 mmol/L 3.2  3.5  3.6   Chloride 98 - 111 mmol/L 109  106  104   CO2 22 - 32 mmol/L 24  25  26    Calcium 8.9 - 10.3 mg/dL 8.2  8.0  8.3   Total Protein 6.5 - 8.1 g/dL 5.8  5.3  5.9   Total Bilirubin 0.3 - 1.2 mg/dL 1.3  1.2  1.1   Alkaline Phos 38 - 126 U/L 69  73  74   AST 15 - 41 U/L 33  31  25   ALT 0 - 44 U/L 23  28  20      Patient was given IV Zofran, IV fluids,... Continue to  vomit.  Asked to admit for intractable nausea vomiting      Assessment & Plan:   Principal Problem:   Intractable nausea and vomiting Active Problems:   Normocytic anemia   HTN (hypertension)   Colon cancer (HCC)   Malignant neoplasm of colon (HCC)   History of pulmonary embolism   Type 2 diabetes mellitus (HCC)   RLS (restless legs syndrome)   Hepatocellular carcinoma (HCC)   Chronic diarrhea   RA (rheumatoid arthritis) (HCC)   CKD (chronic kidney disease), stage III (HCC)   Gastroesophageal reflux disease   History of DVT (deep vein thrombosis)     Assessment and Plan: * Intractable nausea and vomiting - Intractable nausea vomiting  -Continue IV fluid hydration -As needed antiemetics including Reglan scheduled 10 mg IV every 8 hours -We will encourage p.o. intake we will start with clears  -Status post upper endoscopy nephrologist Dr. 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 we will initiate p.o. iron supplements Continue supplement folic acid  -Drop in hemoglobin 6.7--symptomatic with tachycardia Discussed with patient and daughter at bedside, agreed for PRBC transfusion--   HTN (hypertension) - Currently stable, continue home medication Coreg As needed hydralazine  History of DVT (deep vein thrombosis) Continue Xarelto  Gastroesophageal reflux disease - Unable to tolerate p.o., due to nausea vomiting, status post upper endoscopy, will initiate IV Protonix for now -When tolerating p.o. we will reinitiate PPI  CKD (chronic kidney disease), stage III (HCC) - Monitoring BUN/creatinine -Creatinine at baseline -Avoiding nephrotoxins  RA (rheumatoid arthritis) (HCC) - Resuming methotrexate and tolerating p.o.  Chronic diarrhea - Chronic diarrhea, resuming her Imodium  Hepatocellular carcinoma (HCC) - Follow-up with oncologist -We will resume home regimen meds once tolerating   RLS (restless legs syndrome) - Continue home regimen of Neurontin and Requip  Type 2 diabetes mellitus (HCC) - Holding home insulin regimen, due to nausea vomiting, poor p.o. intake -Per CBG q. ACH S with SSI coverage -Home medication NovoLog 70/30, Tresiba,  -Hemoglobin A1c proximately year ago 6.5  History of pulmonary embolism - Continue Xarelto  Colon cancer (HCC) - Currently under treatment, -Currents meds including Atezolizumab, Bevacizumab, methotrexate and folic acid,  -Resume treatment once stable Follow-up with oncologist as outpatient   -------------------------------------------------------------------------------------------------------------------------------------------  DVT prophylaxis:  TED hose Start: 02/07/22 1352 SCDs Start: 02/07/22 1352 rivaroxaban (XARELTO) tablet 20 mg   Code Status:   Code  Status: Full Code  Family Communication: No family member present at bedside- attempt will be made to update daily The above findings and plan of care has been discussed with patient (and family)  in detail,  they expressed understanding and agreement of above. -Advance care planning has been discussed.   Admission status:   Status is: Inpatient The patient will require care spanning >  2 midnights and should be moved to inpatient because: Severe intractable nausea vomiting, unable to tolerate liquids or solids, symptomatic anemia-will need blood transfusion-further evaluation by GI     Procedures:   No admission procedures for hospital encounter.   Antimicrobials:  Anti-infectives (From admission, onward)    None        Medication:   carvedilol  6.25 mg Oral BID WC   Chlorhexidine Gluconate Cloth  6 each Topical Daily   folic acid  1 mg Oral Once per day on Mon Tue Wed Thu Fri Sat   furosemide  20 mg Intravenous Once   insulin aspart  0-6 Units Subcutaneous TID WC   [START ON 02/09/2022] methotrexate  20 mg Oral Q Sun   metoCLOPramide (REGLAN) injection  10 mg Intravenous Q8H   pantoprazole (PROTONIX) IV  40 mg Intravenous Q12H   rivaroxaban  20 mg Oral Q supper   sodium chloride flush  3 mL Intravenous Q12H   sodium chloride flush  3 mL Intravenous Q12H    sodium chloride, acetaminophen **OR** acetaminophen, bisacodyl, gabapentin, hydrALAZINE, HYDROmorphone (DILAUDID) injection, ipratropium, levalbuterol, ondansetron **OR** ondansetron (ZOFRAN) IV, oxyCODONE, rOPINIRole, sodium chloride flush, traZODone   Objective:   Vitals:   02/07/22 1450 02/08/22 0500 02/08/22 1015 02/08/22 1045  BP: (!) 161/66  (!) 142/57 (!) 156/63  Pulse: (!) 107  (!) 110 (!) 112  Resp:   17 18  Temp: 98.1 F (36.7 C)  98.2 F (36.8 C) 99 F (37.2 C)  TempSrc: Oral  Oral Oral  SpO2: 99%  94% 93%  Weight:  103.1 kg    Height:        Intake/Output Summary (Last 24 hours) at 02/08/2022  1115 Last data filed at 02/08/2022 0500 Gross per 24 hour  Intake 819.61 ml  Output --  Net 819.61 ml   Filed Weights   02/07/22 1142 02/08/22 0500  Weight: 99.8 kg 103.1 kg     Examination:   Physical Exam  Constitution:  Alert, cooperative, no distress,  Appears calm and comfortable  Still complaining nausea vomiting unable to tolerate solid or liquid Psychiatric:   Normal and stable mood and affect, cognition intact,   HEENT:        Normocephalic, PERRL, otherwise with in Normal limits  Chest:         Chest symmetric Cardio vascular:  S1/S2, RRR, No murmure, No Rubs or Gallops  pulmonary: Clear to auscultation bilaterally, respirations unlabored, negative wheezes / crackles Abdomen: Soft, non-tender, non-distended, bowel sounds,no masses, no organomegaly Muscular skeletal: Limited exam - in bed, able to move all 4 extremities,   Neuro: CNII-XII intact. , normal motor and sensation, reflexes intact  Extremities: ++2  pitting edema lower extremities, +2 pulses  Skin: Dry, warm to touch, negative for any Rashes, No open wounds Wounds: per nursing documentation   ------------------------------------------------------------------------------------------------------------------------------------------    LABs:     Latest Ref Rng & Units 02/08/2022    7:33 AM 02/08/2022    4:09 AM 02/07/2022   12:07 PM  CBC  WBC 4.0 - 10.5 K/uL  6.2  5.1   Hemoglobin 12.0 - 15.0 g/dL 7.3  6.7  7.6   Hematocrit 36.0 - 46.0 % 23.7  21.9  24.3   Platelets 150 - 400 K/uL  125  136       Latest Ref Rng & Units 02/08/2022    4:09 AM 02/07/2022   12:07 PM 01/30/2022    8:12 AM  CMP  Glucose  70 - 99 mg/dL 454  098  119   BUN 8 - 23 mg/dL 13  13  17    Creatinine 0.44 - 1.00 mg/dL 1.47  8.29  5.62   Sodium 135 - 145 mmol/L 142  139  136   Potassium 3.5 - 5.1 mmol/L 3.2  3.2  3.5   Chloride 98 - 111 mmol/L 112  109  106   CO2 22 - 32 mmol/L 25  24  25    Calcium 8.9 - 10.3 mg/dL 8.1  8.2  8.0    Total Protein 6.5 - 8.1 g/dL  5.8  5.3   Total Bilirubin 0.3 - 1.2 mg/dL  1.3  1.2   Alkaline Phos 38 - 126 U/L  69  73   AST 15 - 41 U/L  33  31   ALT 0 - 44 U/L  23  28        Micro Results No results found for this or any previous visit (from the past 240 hour(s)).  Radiology Reports DG Abd Acute W/Chest  Result Date: 02/07/2022 CLINICAL DATA:  vomiting s/p endoscopy EXAM: DG ABDOMEN ACUTE WITH 1 VIEW CHEST COMPARISON:  CT 10/15/2021 FINDINGS: Chest port catheter tip overlies the proximal superior vena cava. Unchanged cardiomediastinal silhouette. No focal airspace disease. Chronic interstitial lung changes. No large pleural effusion. No pneumothorax. There is no acute osseous abnormality. Degenerative changes of the spine. Paucity of bowel gas. No evidence of free intraperitoneal gas. There is a vascular coil in the right upper quadrant. IMPRESSION: Paucity of bowel gas.  No evidence of free intraperitoneal gas. No acute cardiopulmonary disease. Electronically Signed   By: Caprice Renshaw M.D.   On: 02/07/2022 12:59    SIGNED: Kendell Bane, MD, FHM. [facility],  Pager (please use [email or web address] to page/text) Please use Epic Secure Chat for non-urgent communication (7AM-7PM)  If 7PM-7AM, please contact night-coverage [email or web address], 02/08/2022, 11:15 AM

## 2022-02-09 ENCOUNTER — Inpatient Hospital Stay (HOSPITAL_COMMUNITY): Payer: Medicare Other

## 2022-02-09 ENCOUNTER — Other Ambulatory Visit: Payer: Self-pay

## 2022-02-09 DIAGNOSIS — J690 Pneumonitis due to inhalation of food and vomit: Secondary | ICD-10-CM | POA: Diagnosis not present

## 2022-02-09 DIAGNOSIS — C220 Liver cell carcinoma: Secondary | ICD-10-CM | POA: Diagnosis not present

## 2022-02-09 DIAGNOSIS — A419 Sepsis, unspecified organism: Secondary | ICD-10-CM

## 2022-02-09 DIAGNOSIS — N390 Urinary tract infection, site not specified: Secondary | ICD-10-CM | POA: Diagnosis present

## 2022-02-09 DIAGNOSIS — J9601 Acute respiratory failure with hypoxia: Secondary | ICD-10-CM | POA: Diagnosis not present

## 2022-02-09 DIAGNOSIS — K7460 Unspecified cirrhosis of liver: Secondary | ICD-10-CM | POA: Diagnosis not present

## 2022-02-09 DIAGNOSIS — R112 Nausea with vomiting, unspecified: Secondary | ICD-10-CM | POA: Diagnosis not present

## 2022-02-09 DIAGNOSIS — R6510 Systemic inflammatory response syndrome (SIRS) of non-infectious origin without acute organ dysfunction: Secondary | ICD-10-CM | POA: Diagnosis not present

## 2022-02-09 LAB — CBC
HCT: 27.9 % — ABNORMAL LOW (ref 36.0–46.0)
Hemoglobin: 8.7 g/dL — ABNORMAL LOW (ref 12.0–15.0)
MCH: 28.2 pg (ref 26.0–34.0)
MCHC: 31.2 g/dL (ref 30.0–36.0)
MCV: 90.6 fL (ref 80.0–100.0)
Platelets: 137 10*3/uL — ABNORMAL LOW (ref 150–400)
RBC: 3.08 MIL/uL — ABNORMAL LOW (ref 3.87–5.11)
RDW: 18 % — ABNORMAL HIGH (ref 11.5–15.5)
WBC: 12.9 10*3/uL — ABNORMAL HIGH (ref 4.0–10.5)
nRBC: 0.2 % (ref 0.0–0.2)

## 2022-02-09 LAB — COMPREHENSIVE METABOLIC PANEL
ALT: 24 U/L (ref 0–44)
AST: 25 U/L (ref 15–41)
Albumin: 2.4 g/dL — ABNORMAL LOW (ref 3.5–5.0)
Alkaline Phosphatase: 64 U/L (ref 38–126)
Anion gap: 7 (ref 5–15)
BUN: 14 mg/dL (ref 8–23)
CO2: 26 mmol/L (ref 22–32)
Calcium: 8.3 mg/dL — ABNORMAL LOW (ref 8.9–10.3)
Chloride: 109 mmol/L (ref 98–111)
Creatinine, Ser: 0.9 mg/dL (ref 0.44–1.00)
GFR, Estimated: >60 mL/min (ref >60)
Glucose, Bld: 188 mg/dL — ABNORMAL HIGH (ref 70–99)
Potassium: 3.1 mmol/L — ABNORMAL LOW (ref 3.5–5.1)
Sodium: 142 mmol/L (ref 135–145)
Total Bilirubin: 2.4 mg/dL — ABNORMAL HIGH (ref 0.3–1.2)
Total Protein: 5.8 g/dL — ABNORMAL LOW (ref 6.5–8.1)

## 2022-02-09 LAB — BLOOD GAS, ARTERIAL
Acid-Base Excess: 3.2 mmol/L — ABNORMAL HIGH (ref 0.0–2.0)
Bicarbonate: 26.9 mmol/L (ref 20.0–28.0)
Drawn by: 27407
FIO2: 32 %
O2 Saturation: 98.3 %
Patient temperature: 38.3
pCO2 arterial: 39 mmHg (ref 32–48)
pH, Arterial: 7.45 (ref 7.35–7.45)
pO2, Arterial: 82 mmHg — ABNORMAL LOW (ref 83–108)

## 2022-02-09 LAB — TROPONIN I (HIGH SENSITIVITY): Troponin I (High Sensitivity): 72 ng/L — ABNORMAL HIGH (ref <18)

## 2022-02-09 LAB — URINALYSIS, ROUTINE W REFLEX MICROSCOPIC
Bilirubin Urine: NEGATIVE
Glucose, UA: NEGATIVE mg/dL
Ketones, ur: 5 mg/dL — AB
Nitrite: NEGATIVE
Protein, ur: 30 mg/dL — AB
Specific Gravity, Urine: >1.046 — ABNORMAL HIGH (ref 1.005–1.030)
WBC, UA: >50 WBC/hpf — ABNORMAL HIGH (ref 0–5)
pH: 5 (ref 5.0–8.0)

## 2022-02-09 LAB — GLUCOSE, CAPILLARY
Glucose-Capillary: 172 mg/dL — ABNORMAL HIGH (ref 70–99)
Glucose-Capillary: 170 mg/dL — ABNORMAL HIGH (ref 70–99)
Glucose-Capillary: 195 mg/dL — ABNORMAL HIGH (ref 70–99)
Glucose-Capillary: 184 mg/dL — ABNORMAL HIGH (ref 70–99)
Glucose-Capillary: 148 mg/dL — ABNORMAL HIGH (ref 70–99)

## 2022-02-09 LAB — MRSA NEXT GEN BY PCR, NASAL: MRSA by PCR Next Gen: NOT DETECTED

## 2022-02-09 LAB — BRAIN NATRIURETIC PEPTIDE: B Natriuretic Peptide: 329 pg/mL — ABNORMAL HIGH (ref 0.0–100.0)

## 2022-02-09 LAB — MAGNESIUM: Magnesium: 1.3 mg/dL — ABNORMAL LOW (ref 1.7–2.4)

## 2022-02-09 MED ORDER — METOPROLOL TARTRATE 5 MG/5ML IV SOLN
2.5000 mg | Freq: Once | INTRAVENOUS | Status: AC
  Administered 2022-02-09: 2.5 mg via INTRAVENOUS

## 2022-02-09 MED ORDER — POTASSIUM CHLORIDE 10 MEQ/100ML IV SOLN
10.0000 meq | INTRAVENOUS | Status: AC
  Administered 2022-02-09 (×2): 10 meq via INTRAVENOUS
  Filled 2022-02-09 (×2): qty 100

## 2022-02-09 MED ORDER — SODIUM CHLORIDE 0.9 % IV SOLN
2.0000 g | INTRAVENOUS | Status: DC
  Administered 2022-02-09 – 2022-02-13 (×5): 2 g via INTRAVENOUS
  Filled 2022-02-09 (×5): qty 20

## 2022-02-09 MED ORDER — PROCHLORPERAZINE EDISYLATE 10 MG/2ML IJ SOLN
10.0000 mg | Freq: Four times a day (QID) | INTRAMUSCULAR | Status: DC
Start: 2022-02-09 — End: 2022-02-14
  Administered 2022-02-09 – 2022-02-14 (×21): 10 mg via INTRAVENOUS
  Filled 2022-02-09 (×21): qty 2

## 2022-02-09 MED ORDER — METHOTREXATE 2.5 MG PO TABS: 20.0000 mg | ORAL_TABLET | ORAL | Status: DC

## 2022-02-09 MED ORDER — FUROSEMIDE 10 MG/ML IJ SOLN: 20.0000 mg | Freq: Once | INTRAMUSCULAR | Status: DC

## 2022-02-09 MED ORDER — METOPROLOL TARTRATE 5 MG/5ML IV SOLN
2.5000 mg | Freq: Once | INTRAVENOUS | Status: AC
Start: 2022-02-09 — End: 2022-02-09

## 2022-02-09 MED ORDER — ALBUMIN HUMAN 25 % IV SOLN
50.0000 g | Freq: Three times a day (TID) | INTRAVENOUS | Status: AC
  Administered 2022-02-09 – 2022-02-10 (×3): 50 g via INTRAVENOUS
  Filled 2022-02-09 (×3): qty 200

## 2022-02-09 MED ORDER — METOPROLOL TARTRATE 5 MG/5ML IV SOLN
INTRAVENOUS | Status: AC
  Administered 2022-02-09: 2.5 mg
  Filled 2022-02-09: qty 5

## 2022-02-09 MED ORDER — FUROSEMIDE 10 MG/ML IJ SOLN
INTRAMUSCULAR | Status: AC
  Administered 2022-02-09: 40 mg
  Filled 2022-02-09: qty 4

## 2022-02-09 MED ORDER — POTASSIUM CHLORIDE CRYS ER 20 MEQ PO TBCR
40.0000 meq | EXTENDED_RELEASE_TABLET | Freq: Once | ORAL | Status: AC
  Administered 2022-02-09: 40 meq via ORAL
  Filled 2022-02-09: qty 2

## 2022-02-09 MED ORDER — MAGNESIUM SULFATE 2 GM/50ML IV SOLN
2.0000 g | Freq: Once | INTRAVENOUS | Status: AC
  Administered 2022-02-09: 2 g via INTRAVENOUS
  Filled 2022-02-09: qty 50

## 2022-02-09 MED ORDER — METOPROLOL TARTRATE 5 MG/5ML IV SOLN
5.0000 mg | Freq: Three times a day (TID) | INTRAVENOUS | Status: DC
Start: 2022-02-09 — End: 2022-02-14
  Administered 2022-02-09 – 2022-02-14 (×15): 5 mg via INTRAVENOUS
  Filled 2022-02-09 (×16): qty 5

## 2022-02-09 NOTE — Progress Notes (Signed)
Upon drawing morning labs, patient was found to be confused, and also had brown vomitus on her pillow. Lab notified the RNs who came to check her vital signs and found her to be tachycardic at almost 190.  Rapid response was called.  EKG was done that showed A-fib with RVR.  2.5 mg metoprolol ordered.  Patient's heart rate improved to 94, but then went back up to 170s.  EKG done again showed A-fib with RVR.  Another 2.5 mg of metoprolol given.  Patient's heart rate came down to the 110s, and blood pressure improved to systolic 130s.  Before blood pressure had been soft in the 110s.  Patient was asymptomatic through the entire thing.  She reported no chest pain, shortness of breath, abdominal pain, nausea, palpitations.  The only change was her confusion.  She thought she was at [facility] for atrial fibrillation.  She could not remember that she was at [facility] for varices and endoscopy.  Nursing was advised to give her a.m. Coreg early.  She also received a dose of Zofran.  We will continue to monitor.

## 2022-02-09 NOTE — Progress Notes (Signed)
Pt. Call light was on, when I entered room Lab was in the room and stated that pt. Was confused and not able to answer questions, and trying to get out of bed. Upon assessment I noticed that pt. had vomited and it was brown in color. Bounding pulse could be seen in abdominal area. VS BP 111/73, HR 187, Temp. 98.5, RR 36, O2 sat was 85% on RA. Put pt. On 3L via [place] and called rapid response. EKG was obtained and showed Afib with RVR. 5mg  IV Metoprolol administered and morning dose of Coreg was given early. Last VS were BP 135/54, HR 112, Temp. 98.5, RR 30, O2 sat 91% on 3L via [place]. Will cont. To monitor.

## 2022-02-09 NOTE — Assessment & Plan Note (Addendum)
Stable now  met SIRS criteria - Overnight 02/09/2022 - developed Tmax 100.9, heart rate of 116, with respirate of 28 blood pressure stable O2 sat 94% on room air, WBC 12.9  -UA indication of urine tract infection chest x-ray possible pneumonia -Urine culture and blood cultures has been sent -Initiated on IV antibiotics of Rocephin 2 g daily  We will monitor closely Sepsis-ruled out

## 2022-02-09 NOTE — Progress Notes (Signed)
Patient cannot tolerate anything PO attempted potassium PO dissolved with applesauce and patient vomited immediately. Patient very congested gurgles with coughing/vomiting  compazine given. MD informed he responded ok.

## 2022-02-09 NOTE — Progress Notes (Signed)
Pt vomited complains with SOB o2 on 3L 87% wheezing inspire and expire. Increased O2 to 4L still wheezing some SOB O2 on 4L 92% Paged MD. Charge nurse informed. Updated daughter. Vitals taken pressure soft temp rectally 100.9. HOB elevated. 40 of Lasix, and chest xray ordered. Will continue to monitor.

## 2022-02-09 NOTE — Assessment & Plan Note (Addendum)
-   Status post transfusion of 2 units PRBC, 02/08/2022 Monitoring H&H -

## 2022-02-10 ENCOUNTER — Inpatient Hospital Stay (HOSPITAL_COMMUNITY): Payer: Medicare Other

## 2022-02-10 ENCOUNTER — Encounter (HOSPITAL_COMMUNITY): Payer: Self-pay | Admitting: Family Medicine

## 2022-02-10 DIAGNOSIS — R112 Nausea with vomiting, unspecified: Secondary | ICD-10-CM | POA: Diagnosis not present

## 2022-02-10 DIAGNOSIS — K7031 Alcoholic cirrhosis of liver with ascites: Secondary | ICD-10-CM | POA: Diagnosis present

## 2022-02-10 LAB — LACTATE DEHYDROGENASE, PLEURAL OR PERITONEAL FLUID: LD, Fluid: 83 U/L — ABNORMAL HIGH (ref 3–23)

## 2022-02-10 LAB — CBC
HCT: 24 % — ABNORMAL LOW (ref 36.0–46.0)
Hemoglobin: 7.4 g/dL — ABNORMAL LOW (ref 12.0–15.0)
MCH: 28.4 pg (ref 26.0–34.0)
MCHC: 30.8 g/dL (ref 30.0–36.0)
MCV: 92 fL (ref 80.0–100.0)
Platelets: 122 10*3/uL — ABNORMAL LOW (ref 150–400)
RBC: 2.61 MIL/uL — ABNORMAL LOW (ref 3.87–5.11)
RDW: 17.8 % — ABNORMAL HIGH (ref 11.5–15.5)
WBC: 6.9 10*3/uL (ref 4.0–10.5)
nRBC: 0 % (ref 0.0–0.2)

## 2022-02-10 LAB — GLUCOSE, CAPILLARY
Glucose-Capillary: 134 mg/dL — ABNORMAL HIGH (ref 70–99)
Glucose-Capillary: 137 mg/dL — ABNORMAL HIGH (ref 70–99)
Glucose-Capillary: 144 mg/dL — ABNORMAL HIGH (ref 70–99)
Glucose-Capillary: 116 mg/dL — ABNORMAL HIGH (ref 70–99)

## 2022-02-10 LAB — MAGNESIUM: Magnesium: 1.9 mg/dL (ref 1.7–2.4)

## 2022-02-10 LAB — PROTIME-INR
INR: 1.6 — ABNORMAL HIGH (ref 0.8–1.2)
Prothrombin Time: 18.4 seconds — ABNORMAL HIGH (ref 11.4–15.2)

## 2022-02-10 LAB — BODY FLUID CELL COUNT WITH DIFFERENTIAL
Lymphs, Fluid: 35 %
Monocyte-Macrophage-Serous Fluid: 61 % (ref 50–90)
Neutrophil Count, Fluid: 4 % (ref 0–25)
Total Nucleated Cell Count, Fluid: 77 cu mm (ref 0–1000)

## 2022-02-10 LAB — COMPREHENSIVE METABOLIC PANEL
ALT: 20 U/L (ref 0–44)
AST: 20 U/L (ref 15–41)
Albumin: 3.5 g/dL (ref 3.5–5.0)
Alkaline Phosphatase: 45 U/L (ref 38–126)
Anion gap: 8 (ref 5–15)
BUN: 19 mg/dL (ref 8–23)
CO2: 29 mmol/L (ref 22–32)
Calcium: 8.7 mg/dL — ABNORMAL LOW (ref 8.9–10.3)
Chloride: 108 mmol/L (ref 98–111)
Creatinine, Ser: 0.88 mg/dL (ref 0.44–1.00)
GFR, Estimated: >60 mL/min (ref >60)
Glucose, Bld: 137 mg/dL — ABNORMAL HIGH (ref 70–99)
Potassium: 2.9 mmol/L — ABNORMAL LOW (ref 3.5–5.1)
Sodium: 145 mmol/L (ref 135–145)
Total Bilirubin: 1.4 mg/dL — ABNORMAL HIGH (ref 0.3–1.2)
Total Protein: 6.2 g/dL — ABNORMAL LOW (ref 6.5–8.1)

## 2022-02-10 LAB — GRAM STAIN

## 2022-02-10 MED ORDER — MAGNESIUM SULFATE 2 GM/50ML IV SOLN
2.0000 g | Freq: Once | INTRAVENOUS | Status: AC
  Administered 2022-02-10: 2 g via INTRAVENOUS
  Filled 2022-02-10: qty 50

## 2022-02-10 MED ORDER — POTASSIUM CHLORIDE CRYS ER 20 MEQ PO TBCR: 20.0000 meq | EXTENDED_RELEASE_TABLET | Freq: Once | ORAL | Status: DC

## 2022-02-10 MED ORDER — ORAL CARE MOUTH RINSE: 15.0000 mL | OROMUCOSAL | Status: DC | PRN

## 2022-02-10 MED ORDER — POTASSIUM CHLORIDE 10 MEQ/100ML IV SOLN
10.0000 meq | INTRAVENOUS | Status: AC
  Administered 2022-02-10 (×4): 10 meq via INTRAVENOUS
  Filled 2022-02-10 (×4): qty 100

## 2022-02-10 NOTE — Progress Notes (Signed)
PT tolerated left sided paracentesis procedure well today and 2.6 Liters of clear yellow fluid removed with labs collected and sent for processing. PT verbalized understanding of post procedure instructions and returned via stretcher to inpatient bed assignment at this time with no acute distress noted.

## 2022-02-10 NOTE — Progress Notes (Signed)
Gastroenterology Progress Note    Primary Care Physician:  Anabel Halon, MD Primary Gastroenterologist:  Dr. Jena Gauss   Patient ID: Wanda Curry; [id number]; 04-Mar-1946    Subjective   Regurgitating about 20-30 minutes after drinking ice chips. States symptom onset after EGD while on way home. Started vomiting and regurgitating liquids. BPE completed this morning with aspiration. She denies any chronic esophageal dysphagia. She does note chronic strangling with just tea but not often. Daughter and son at bedside.    Objective   Vital signs in last 24 hours Temp:  [97.6 F (36.4 C)-98.4 F (36.9 C)] 97.6 F (36.4 C) (06/26 1113) Pulse Rate:  [75-101] 101 (06/26 1113) Resp:  [16-26] 17 (06/26 1113) BP: (117-159)/(39-76) 158/51 (06/26 1100) SpO2:  [89 %-100 %] 100 % (06/26 1113) Weight:  [98 kg-98.7 kg] 98.7 kg (06/26 0609) Last BM Date : 02/07/22  Physical Exam General:   Alert and oriented, pleasant Head:  Normocephalic and atraumatic. Eyes:  No icterus, sclera clear. Conjuctiva pink.  Abdomen:  Bowel sounds present, soft, obese, non-tense ascites, non-tender.  Msk:  Symmetrical without gross deformities. Normal posture. Extremities:  With pedal edema Neurologic:  Alert and  oriented x4 Psych:  Alert and cooperative. Normal mood and affect.  Intake/Output from previous day: 06/25 0701 - 06/26 0700 In: 342.6 [IV Piggyback:342.6] Out: 800 [Urine:800] Intake/Output this shift: Total I/O In: 921.6 [IV Piggyback:921.6] Out: 550 [Urine:550]  Lab Results  Recent Labs    02/08/22 1450 02/09/22 0430 02/10/22 0433  WBC 6.7 12.9* 6.9  HGB 8.5* 8.7* 7.4*  HCT 27.3* 27.9* 24.0*  PLT 126* 137* 122*   BMET Recent Labs    02/08/22 0409 02/09/22 0813 02/10/22 0433  NA 142 142 145  K 3.2* 3.1* 2.9*  CL 112* 109 108  CO2 25 26 29   GLUCOSE 144* 188* 137*  BUN 13 14 19   CREATININE 0.77 0.90 0.88  CALCIUM 8.1* 8.3* 8.7*   LFT Recent Labs    02/07/22 1207  02/09/22 0813 02/10/22 0433  PROT 5.8* 5.8* 6.2*  ALBUMIN 2.3* 2.4* 3.5  AST 33 25 20  ALT 23 24 20   ALKPHOS 69 64 45  BILITOT 1.3* 2.4* 1.4*   PT/INR Recent Labs    02/10/22 0433  LABPROT 18.4*  INR 1.6*     Studies/Results DG Chest 1 View  Result Date: 02/10/2022 CLINICAL DATA:  Aspiration EXAM: CHEST  1 VIEW COMPARISON:  02/09/2022 FINDINGS: Retention of contrast within the thoracic esophagus from preceding esophagram. No contrast seen within stomach, cannot exclude esophageal obstruction or achalasia. Normal heart size and mediastinal contours. Atherosclerotic calcification aorta. Patchy BILATERAL pulmonary infiltrates again seen. Aspirated contrast identified within the LEFT mainstem bronchus extending to the central airways in the perihilar region. No pleural effusion or pneumothorax. IMPRESSION: Aspirated contrast material within the central airways of the LEFT lung. Persistent patchy BILATERAL pulmonary infiltrates. Prolonged thoracic esophageal retention of contrast material, esophagus appearing mildly dilated, without contrast seen within the stomach, question achalasia versus distal esophageal obstruction. Electronically Signed   By: Ulyses Southward M.D.   On: 02/10/2022 10:56   DG ESOPHAGUS W SINGLE CM (SOL OR THIN BA)  Result Date: 02/10/2022 CLINICAL DATA:  Dysphagia, concern for aspiration, history liver cancer, nausea and vomiting since endoscopy EXAM: ESOPHOGRAM/BARIUM SWALLOW TECHNIQUE: Single contrast examination was performed using  thin barium. FLUOROSCOPY: Radiation Exposure Index (as provided by the fluoroscopic device): 30.4 mGy Kerma COMPARISON:  None Available. FINDINGS: Marked esophageal dysmotility, with poor  primary and secondary peristaltic waves. Prolonged retention of contrast within a mildly dilated thoracic esophagus. After 3 swallows of barium, patient became nauseated and vomited a portion of the contrast. A portion of the contrast was also aspirated into the  trachea, LEFT mainstem bronchus and extending towards LEFT lower lobe, observed under fluoroscopy though these images were not saved. Procedure terminated. Oxygen saturation remained 95-97% following suctioning. Extensive atherosclerotic calcification of aortic arch. IMPRESSION: Marked esophageal dysmotility with a mildly dilated thoracic esophagus which demonstrates prolonged retention of contrast. Aspiration of contrast into trachea, LEFT mainstem bronchus and proximal LEFT lower lobe bronchus. Electronically Signed   By: Ulyses Southward M.D.   On: 02/10/2022 10:54   DG CHEST PORT 1 VIEW  Result Date: 02/09/2022 CLINICAL DATA:  Shortness of breath.  History of diabetes and cancer EXAM: PORTABLE CHEST 1 VIEW COMPARISON:  01/08/2022 FINDINGS: Generous heart size accentuated by rotation. Aortic atherosclerosis. Left subclavian porta catheter with tip near the SVC origin. Patchy pulmonary opacity in the lower lungs. Lung volumes are low. No visible effusion or pneumothorax. IMPRESSION: Patchy opacity at the bases primarily concerning for multifocal pneumonia. Electronically Signed   By: Tiburcio Pea M.D.   On: 02/09/2022 08:31   CT ABDOMEN PELVIS W CONTRAST  Result Date: 02/08/2022 CLINICAL DATA:  Nausea and vomiting for 2 days. Recent endoscopy. Hepatocellular carcinoma and colon carcinoma. Prior Y 90. * Tracking Code: BO * EXAM: CT ABDOMEN AND PELVIS WITH CONTRAST TECHNIQUE: Multidetector CT imaging of the abdomen and pelvis was performed using the standard protocol following bolus administration of intravenous contrast. RADIATION DOSE REDUCTION: This exam was performed according to the departmental dose-optimization program which includes automated exposure control, adjustment of the mA and/or kV according to patient size and/or use of iterative reconstruction technique. CONTRAST:  OMNIPAQUE IOHEXOL 300 MG/ML  SOLN COMPARISON:  MRI on 12/12/2021, and CT on 10/15/2021 FINDINGS: Lower Chest: No acute  findings. Hepatobiliary: Hepatic cirrhosis again demonstrated. Central low-attenuation lesion measures 2.8 x 2.5 cm, without significant change since previous study. A 9 mm lesion in the caudate is stable. A 1 cm low-attenuation lesion in segment 4A and a nearly isointense lesion in segment 3 are stable when compared to diffusion imaging on prior MRI. No definite new or enlarging liver lesions identified. Prior cholecystectomy. No evidence of biliary obstruction. Pancreas:  No mass or inflammatory changes. Spleen: Within normal limits in size and appearance. Adrenals/Urinary Tract: No masses identified. No evidence of ureteral calculi or hydronephrosis. Stomach/Bowel: Small hiatal hernia again noted. Postop changes again seen from previous right colectomy. No evidence of obstruction, inflammatory process or abscess. Diverticulosis is seen mainly involving the sigmoid colon, however there is no evidence of diverticulitis. Vascular/Lymphatic: No pathologically enlarged lymph nodes. No acute vascular findings. Aortic atherosclerotic calcification incidentally noted. Reproductive: Prior hysterectomy noted. Adnexal regions are unremarkable in appearance. Other: Moderate to large amount of ascites is significantly increased since prior study. Musculoskeletal:  No suspicious bone lesions identified. IMPRESSION: Hepatic cirrhosis. No significant change in several hepatic lesions compared to prior MRI. Increased moderate to large amount of ascites. Stable small hiatal hernia. Colonic diverticulosis, without radiographic evidence of diverticulitis. Aortic Atherosclerosis (ICD10-I70.0). Electronically Signed   By: Danae Orleans M.D.   On: 02/08/2022 18:18   DG Abd Acute W/Chest  Result Date: 02/07/2022 CLINICAL DATA:  vomiting s/p endoscopy EXAM: DG ABDOMEN ACUTE WITH 1 VIEW CHEST COMPARISON:  CT 10/15/2021 FINDINGS: Chest port catheter tip overlies the proximal superior vena cava. Unchanged cardiomediastinal silhouette.  No  focal airspace disease. Chronic interstitial lung changes. No large pleural effusion. No pneumothorax. There is no acute osseous abnormality. Degenerative changes of the spine. Paucity of bowel gas. No evidence of free intraperitoneal gas. There is a vascular coil in the right upper quadrant. IMPRESSION: Paucity of bowel gas.  No evidence of free intraperitoneal gas. No acute cardiopulmonary disease. Electronically Signed   By: Caprice Renshaw M.D.   On: 02/07/2022 12:59    Assessment  76 y.o. 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 She was admitted with intractable N/V, acute on chronic anemia, developed acute respiratory failure in setting of pneumonia over the weekend.   N/V, regurgitation: UGI this morning with marked esophageal dysmotility with mildly dilated thoracic esophagus and prolonged retention of contrast, aspiration at time of procedure into trachea, left mainstem bronchus and left lower lobe bronchus. CXR subsequently with aspirated contrast material, persistent patchy bilateral pulmonary infiltrates. Her presentation is interesting, as she denies any chronic esophageal dysphagia but does note occasional "strangling" on only sweet tea. Appreciate Speech Pathology consultation and continued following.  Presentation and imaging concerning for achalasia. She has had regurgitation even with ice chips. Will need to discuss further with attending next steps. No improvement with Reglan or compazine.   Cirrhosis: moderate to large volume ascites on CT. Physical exam non-tense ascites. Ascites has been present on prior imaging. [place] para today with fluid analysis if able to safely perform.   Acute on chronic anemia: received 1 unit PRBCs this admission. Hgb 7.4 today.  No overt GI bleeding. From review of chart, her Hgb was 14 in Sept 2022. Noted to have normocytic anemia in May 2023. No labs from September 2022 till May 2023. Unclear chronicity of this. Ferritin 42 recently, iron low at 23. Will need further evaluation as outpatient for IDA. She has not had surveillance colonoscopy since diagnosis in 2018, from what I can see.   SIRS: met criteria over the weekend. Found to have UTI, pneumonia. Afebrile.    Plan / Recommendations  Remain upright at least 2 hours after oral ingestion PPI BID Hypokalemia replacement per attending Follow H/H, transfuse as needed [place] para today as previously scheduled Daily weights Outpatient IDA evaluation Further recommendations to follow    LOS: 2 days    02/10/2022, 11:52 AM  Gelene Mink, PhD, ANP-BC [facility]

## 2022-02-10 NOTE — Assessment & Plan Note (Addendum)
Acute on chronic ascites -Status post ultrasound-guided thoracentesis 02/10/2022 yielding 2.5 L

## 2022-02-11 ENCOUNTER — Inpatient Hospital Stay: Payer: Self-pay

## 2022-02-11 ENCOUNTER — Other Ambulatory Visit: Payer: Self-pay

## 2022-02-11 DIAGNOSIS — R188 Other ascites: Secondary | ICD-10-CM | POA: Diagnosis not present

## 2022-02-11 DIAGNOSIS — R112 Nausea with vomiting, unspecified: Secondary | ICD-10-CM | POA: Diagnosis not present

## 2022-02-11 DIAGNOSIS — D649 Anemia, unspecified: Secondary | ICD-10-CM | POA: Diagnosis not present

## 2022-02-11 DIAGNOSIS — K7460 Unspecified cirrhosis of liver: Secondary | ICD-10-CM | POA: Diagnosis not present

## 2022-02-11 DIAGNOSIS — R1310 Dysphagia, unspecified: Secondary | ICD-10-CM

## 2022-02-11 DIAGNOSIS — R1319 Other dysphagia: Secondary | ICD-10-CM | POA: Diagnosis not present

## 2022-02-11 LAB — BASIC METABOLIC PANEL
Anion gap: 9 (ref 5–15)
BUN: 21 mg/dL (ref 8–23)
CO2: 29 mmol/L (ref 22–32)
Calcium: 8.8 mg/dL — ABNORMAL LOW (ref 8.9–10.3)
Chloride: 109 mmol/L (ref 98–111)
Creatinine, Ser: 0.76 mg/dL (ref 0.44–1.00)
GFR, Estimated: >60 mL/min (ref >60)
Glucose, Bld: 113 mg/dL — ABNORMAL HIGH (ref 70–99)
Potassium: 3.3 mmol/L — ABNORMAL LOW (ref 3.5–5.1)
Sodium: 147 mmol/L — ABNORMAL HIGH (ref 135–145)

## 2022-02-11 LAB — CBC
HCT: 25.4 % — ABNORMAL LOW (ref 36.0–46.0)
Hemoglobin: 7.6 g/dL — ABNORMAL LOW (ref 12.0–15.0)
MCH: 27.9 pg (ref 26.0–34.0)
MCHC: 29.9 g/dL — ABNORMAL LOW (ref 30.0–36.0)
MCV: 93.4 fL (ref 80.0–100.0)
Platelets: 115 10*3/uL — ABNORMAL LOW (ref 150–400)
RBC: 2.72 MIL/uL — ABNORMAL LOW (ref 3.87–5.11)
RDW: 18.1 % — ABNORMAL HIGH (ref 11.5–15.5)
WBC: 5.7 10*3/uL (ref 4.0–10.5)
nRBC: 0 % (ref 0.0–0.2)

## 2022-02-11 LAB — GLUCOSE, CAPILLARY
Glucose-Capillary: 128 mg/dL — ABNORMAL HIGH (ref 70–99)
Glucose-Capillary: 148 mg/dL — ABNORMAL HIGH (ref 70–99)
Glucose-Capillary: 167 mg/dL — ABNORMAL HIGH (ref 70–99)
Glucose-Capillary: 185 mg/dL — ABNORMAL HIGH (ref 70–99)

## 2022-02-11 LAB — URINE CULTURE: Culture: >=100000 — AB

## 2022-02-11 MED ORDER — KCL IN DEXTROSE-NACL 40-5-0.45 MEQ/L-%-% IV SOLN: INTRAVENOUS | Status: AC

## 2022-02-11 MED ORDER — FUROSEMIDE 10 MG/ML IJ SOLN
20.0000 mg | Freq: Every day | INTRAMUSCULAR | Status: DC
  Administered 2022-02-11 – 2022-02-13 (×3): 20 mg via INTRAVENOUS
  Filled 2022-02-11 (×3): qty 2

## 2022-02-11 MED ORDER — SALINE SPRAY 0.65 % NA SOLN
1.0000 | NASAL | Status: DC | PRN
  Filled 2022-02-11: qty 44

## 2022-02-11 NOTE — Assessment & Plan Note (Addendum)
-   Status post barium swallow evaluation, resulted in aspiration of the dye -Solids compatible with dysmotility of esophagus, with possible lower esophagus stricture -Findings were discussed with the GI... No real option of treatment at this point -Hoping that this will spontaneously resolve -Continue to try clear liquids with slow advancement  -Thus far unable to tolerate any solids -Patient daughter she has not had any nutrition for past 6 days--- She is reluctant with NG tube/Dobbhoff placement and tube feeds Open to idea of TPN  -TPN ordered today 02/11/2022  -Appreciate GI following and further recommendations

## 2022-02-11 NOTE — Plan of Care (Signed)

## 2022-02-11 NOTE — Progress Notes (Addendum)
Subjective: No real improvement today. Maybe slightly less regurgitation, but still regurgitating after eating ice chips. Color is clear and foamy, no hematemesis or coffee-ground emesis.  No nausea, the ice chips just come back up. Denies abdominal pain, brbpr or melena.  She has not had a bowel movement recently as she has not eaten since her EGD on 6/22.   Reports her SOB has improved. Not requiring oxygen.   Denies any significant history of alcohol use. Drank intermittently 40 years ago.   Objective: Vital signs in last 24 hours: Temp:  [97.6 F (36.4 C)-98.5 F (36.9 C)] 98.5 F (36.9 C) (06/27 0731) Pulse Rate:  [70-96] 83 (06/27 1000) Resp:  [11-22] 17 (06/27 1000) BP: (138-165)/(38-75) 159/68 (06/27 1000) SpO2:  [87 %-100 %] 98 % (06/27 1000) Last BM Date : 02/10/22 General:   Alert and oriented, pleasant, NAD Head:  Normocephalic and atraumatic. Eyes:  No icterus, sclera clear. Conjuctiva pink.  Abdomen:  Bowel sounds present, soft, non-tender, non-distended. No HSM or hernias noted. No rebound or guarding. No masses appreciated  Msk:  Symmetrical without gross deformities. Normal posture. Extremities:  Without clubbing or edema. Neurologic:  Alert and  oriented x4;  grossly normal neurologically. Skin:  Warm and dry, intact without significant lesions.  Cervical Nodes:  No significant cervical adenopathy. Psych:  Alert and cooperative. Normal mood and affect.  Intake/Output from previous day: 06/26 0701 - 06/27 0700 In: 1217.4 [I.V.:3; IV Piggyback:1214.4] Out: 950 [Urine:950] Intake/Output this shift: No intake/output data recorded.  Lab Results: Recent Labs    02/09/22 0430 02/10/22 0433 02/11/22 0309  WBC 12.9* 6.9 5.7  HGB 8.7* 7.4* 7.6*  HCT 27.9* 24.0* 25.4*  PLT 137* 122* 115*   BMET Recent Labs    02/09/22 0813 02/10/22 0433 02/11/22 0309  NA 142 145 147*  K 3.1* 2.9* 3.3*  CL 109 108 109  CO2 26 29 29   GLUCOSE 188* 137* 113*  BUN 14  19 21   CREATININE 0.90 0.88 0.76  CALCIUM 8.3* 8.7* 8.8*   LFT Recent Labs    02/09/22 0813 02/10/22 0433  PROT 5.8* 6.2*  ALBUMIN 2.4* 3.5  AST 25 20  ALT 24 20  ALKPHOS 64 45  BILITOT 2.4* 1.4*   PT/INR Recent Labs    02/10/22 0433  LABPROT 18.4*  INR 1.6*   Studies/Results: US Paracentesis  Result Date: 02/10/2022 INDICATION: Ascites, colon cancer EXAM: ULTRASOUND GUIDED DIAGNOSTIC AND THERAPEUTIC PARACENTESIS MEDICATIONS: None. COMPLICATIONS: None immediate. PROCEDURE: Informed written consent was obtained from the patient after a discussion of the risks, benefits and alternatives to treatment. A timeout was performed prior to the initiation of the procedure. Initial ultrasound scanning demonstrates a large amount of ascites within the right lower abdominal quadrant. The right lower abdomen was prepped and draped in the usual sterile fashion. 1% lidocaine was used for local anesthesia. Following this, a 10 cm length 5 [place] Yueh catheter was introduced. An ultrasound image was saved for documentation purposes. The paracentesis was performed. The catheter was removed and a dressing was applied. The patient tolerated the procedure well without immediate post procedural complication. FINDINGS: A total of approximately 2.6 L of clear yellow ascitic fluid was removed. Samples were sent to the laboratory as requested by the clinical team. IMPRESSION: Successful ultrasound-guided paracentesis yielding 2.6 liters of peritoneal fluid. Electronically Signed   By: Ulyses Southward M.D.   On: 02/10/2022 14:36   DG Chest 1 View  Result Date: 02/10/2022 CLINICAL DATA:  Aspiration EXAM: CHEST  1 VIEW COMPARISON:  02/09/2022 FINDINGS: Retention of contrast within the thoracic esophagus from preceding esophagram. No contrast seen within stomach, cannot exclude esophageal obstruction or achalasia. Normal heart size and mediastinal contours. Atherosclerotic calcification aorta. Patchy BILATERAL pulmonary  infiltrates again seen. Aspirated contrast identified within the LEFT mainstem bronchus extending to the central airways in the perihilar region. No pleural effusion or pneumothorax. IMPRESSION: Aspirated contrast material within the central airways of the LEFT lung. Persistent patchy BILATERAL pulmonary infiltrates. Prolonged thoracic esophageal retention of contrast material, esophagus appearing mildly dilated, without contrast seen within the stomach, question achalasia versus distal esophageal obstruction. Electronically Signed   By: Ulyses Southward M.D.   On: 02/10/2022 10:56   DG ESOPHAGUS W SINGLE CM (SOL OR THIN BA)  Result Date: 02/10/2022 CLINICAL DATA:  Dysphagia, concern for aspiration, history liver cancer, nausea and vomiting since endoscopy EXAM: ESOPHOGRAM/BARIUM SWALLOW TECHNIQUE: Single contrast examination was performed using  thin barium. FLUOROSCOPY: Radiation Exposure Index (as provided by the fluoroscopic device): 30.4 mGy Kerma COMPARISON:  None Available. FINDINGS: Marked esophageal dysmotility, with poor primary and secondary peristaltic waves. Prolonged retention of contrast within a mildly dilated thoracic esophagus. After 3 swallows of barium, patient became nauseated and vomited a portion of the contrast. A portion of the contrast was also aspirated into the trachea, LEFT mainstem bronchus and extending towards LEFT lower lobe, observed under fluoroscopy though these images were not saved. Procedure terminated. Oxygen saturation remained 95-97% following suctioning. Extensive atherosclerotic calcification of aortic arch. IMPRESSION: Marked esophageal dysmotility with a mildly dilated thoracic esophagus which demonstrates prolonged retention of contrast. Aspiration of contrast into trachea, LEFT mainstem bronchus and proximal LEFT lower lobe bronchus. Electronically Signed   By: Ulyses Southward M.D.   On: 02/10/2022 10:54    Assessment: 76 y.o. 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 She was admitted with intractable N/V, acute on chronic anemia, developed acute respiratory failure in setting of pneumonia over the weekend.   N/V, regurgitation: UGI series yesterday with marked esophageal dysmotility with a mildly dilated thoracic esophagus and prolonged retention of contrast, aspiration at time of procedure into trachea, left mainstem bronchus, and left lower lobe bronchus.  Chest x-ray subsequently with aspirated contrast material, persistently patchy bilateral pulmonary infiltrates.  She has denied any chronic esophageal dysphagia, but did admit to occasional "strangling" only on sweet tea.  She had no improvement with Reglan or Compazine and has continued to have regurgitation even with ice chips.  Overall, it is felt that patient likely had underlying esophageal dysmotility that has been exacerbated following recent EGD with variceal banding.  It will take some time for this to improve, and it is unclear how much her symptoms will improve.  As she has been several days without eating and is unable to advance her diet, we need to consider alternative routes of feeding.  Dr. Levon Hedger discussed case with Dr. Henreitta Leber who stated patient was not a good surgical candidate to undergo G-tube consider cirrhosis, esophageal varices, and portal hypertensive gastropathy.  At this time, we are recommending TPN with outpatient surgical evaluation at tertiary care center for consideration of feeding tube.    Cirrhosis:  MELD 13 yesterday. Moderate to large volume ascites on CT this  admission s/p paracentesis yesterday yielding 2.6 L, negative for SBP, cytology pending. She has mild peripheral edema with 1+ in the bilateral legs and feet.  No HE.   We will start IV Lasix 20 mg daily, but will need to monitor hypokalemia closely.  Acute on chronic anemia: Hemoglobin 14.6 in September 2022, normocytic anemia in May 2023 with hemoglobin down to 10.1, Hgb down to 7.7  on 6/15, Hgb 7.6 on admission, down to 6.7 on 6/24 s/p 1 unit PRBCs.  Hemoglobin improved as high as 8.7 on 6/25, down to 7.4 yesterday, stable at 7.6 today.   Earlier this month, ferritin 42, saturation and iron low, received IV iron on 6/23. No overt GI bleeding.  Etiology unclear.  May be influenced by immunotherapy/HCC, currently receiving Atezolizumab + Bevacizumab.  May need to consider obtaining colonoscopy outpatient.  Last colonoscopy was in December 2018 with pancolonic diverticulosis. Continue to monitor for now and transfuse as needed.   Hypokalemia:  Management per hospitalist.  Starting IV Lasix 20 mg daily due to cirrhosis with ascites and mild peripheral edema.  We will need to monitor potassium closely.  Plan: Keep NPO Recommend starting TPN Will need outpatient tertiary care surgical evaluation for consideration of feeding tube placement for enteral feeding.  Continue to follow H/H and transfuse as needed . Monitor for overt GI bleeding.  Outpatient IDA evaluation.  Start IV lasix 20 mg daily.  Monitor electrolytes closely.  Correction of hypokalemia per hospitalist.  Outpatient cirrhosis care.    LOS: 3 days    02/11/2022, 11:19 AM   Ermalinda Memos, [facility]

## 2022-02-12 ENCOUNTER — Encounter (HOSPITAL_COMMUNITY): Payer: Self-pay | Admitting: Internal Medicine

## 2022-02-12 DIAGNOSIS — R112 Nausea with vomiting, unspecified: Secondary | ICD-10-CM | POA: Diagnosis not present

## 2022-02-12 DIAGNOSIS — K7460 Unspecified cirrhosis of liver: Secondary | ICD-10-CM | POA: Diagnosis not present

## 2022-02-12 DIAGNOSIS — K219 Gastro-esophageal reflux disease without esophagitis: Secondary | ICD-10-CM

## 2022-02-12 DIAGNOSIS — D649 Anemia, unspecified: Secondary | ICD-10-CM | POA: Diagnosis not present

## 2022-02-12 DIAGNOSIS — R1319 Other dysphagia: Secondary | ICD-10-CM | POA: Diagnosis not present

## 2022-02-12 LAB — COMPREHENSIVE METABOLIC PANEL
ALT: 17 U/L (ref 0–44)
AST: 20 U/L (ref 15–41)
Albumin: 2.7 g/dL — ABNORMAL LOW (ref 3.5–5.0)
Alkaline Phosphatase: 41 U/L (ref 38–126)
Anion gap: 6 (ref 5–15)
BUN: 19 mg/dL (ref 8–23)
CO2: 29 mmol/L (ref 22–32)
Calcium: 8.2 mg/dL — ABNORMAL LOW (ref 8.9–10.3)
Chloride: 109 mmol/L (ref 98–111)
Creatinine, Ser: 0.69 mg/dL (ref 0.44–1.00)
GFR, Estimated: >60 mL/min (ref >60)
Glucose, Bld: 187 mg/dL — ABNORMAL HIGH (ref 70–99)
Potassium: 3.3 mmol/L — ABNORMAL LOW (ref 3.5–5.1)
Sodium: 144 mmol/L (ref 135–145)
Total Bilirubin: 1.4 mg/dL — ABNORMAL HIGH (ref 0.3–1.2)
Total Protein: 4.9 g/dL — ABNORMAL LOW (ref 6.5–8.1)

## 2022-02-12 LAB — TYPE AND SCREEN
ABO/RH(D): O POS
Antibody Screen: NEGATIVE
Unit division: 0
Unit division: 0

## 2022-02-12 LAB — BPAM RBC
Blood Product Expiration Date: 202307252359
Blood Product Expiration Date: 202307272359
ISSUE DATE / TIME: 202306241020
Unit Type and Rh: 5100
Unit Type and Rh: 5100

## 2022-02-12 LAB — CBC
HCT: 25.6 % — ABNORMAL LOW (ref 36.0–46.0)
Hemoglobin: 7.9 g/dL — ABNORMAL LOW (ref 12.0–15.0)
MCH: 28.4 pg (ref 26.0–34.0)
MCHC: 30.9 g/dL (ref 30.0–36.0)
MCV: 92.1 fL (ref 80.0–100.0)
Platelets: 102 10*3/uL — ABNORMAL LOW (ref 150–400)
RBC: 2.78 MIL/uL — ABNORMAL LOW (ref 3.87–5.11)
RDW: 18 % — ABNORMAL HIGH (ref 11.5–15.5)
WBC: 4.3 10*3/uL (ref 4.0–10.5)
nRBC: 0 % (ref 0.0–0.2)

## 2022-02-12 LAB — GLUCOSE, CAPILLARY
Glucose-Capillary: 193 mg/dL — ABNORMAL HIGH (ref 70–99)
Glucose-Capillary: 222 mg/dL — ABNORMAL HIGH (ref 70–99)
Glucose-Capillary: 284 mg/dL — ABNORMAL HIGH (ref 70–99)
Glucose-Capillary: 273 mg/dL — ABNORMAL HIGH (ref 70–99)

## 2022-02-12 LAB — TRIGLYCERIDES: Triglycerides: 61 mg/dL (ref <150)

## 2022-02-12 LAB — CYTOLOGY - NON PAP

## 2022-02-12 LAB — PHOSPHORUS: Phosphorus: 2.3 mg/dL — ABNORMAL LOW (ref 2.5–4.6)

## 2022-02-12 LAB — MAGNESIUM: Magnesium: 1.6 mg/dL — ABNORMAL LOW (ref 1.7–2.4)

## 2022-02-12 MED ORDER — INSULIN ASPART 100 UNIT/ML IJ SOLN
0.0000 [IU] | INTRAMUSCULAR | Status: DC
  Administered 2022-02-12: 2 [IU] via SUBCUTANEOUS
  Administered 2022-02-12 (×2): 3 [IU] via SUBCUTANEOUS
  Administered 2022-02-13 (×2): 2 [IU] via SUBCUTANEOUS
  Administered 2022-02-13 (×2): 3 [IU] via SUBCUTANEOUS
  Administered 2022-02-14: 2 [IU] via SUBCUTANEOUS
  Administered 2022-02-14: 3 [IU] via SUBCUTANEOUS
  Administered 2022-02-14: 2 [IU] via SUBCUTANEOUS

## 2022-02-12 MED ORDER — MAGNESIUM SULFATE 2 GM/50ML IV SOLN
2.0000 g | Freq: Once | INTRAVENOUS | Status: AC
Start: 2022-02-12 — End: 2022-02-12
  Administered 2022-02-12: 2 g via INTRAVENOUS
  Filled 2022-02-12: qty 50

## 2022-02-12 MED ORDER — TRAVASOL 10 % IV SOLN
INTRAVENOUS | Status: DC
  Filled 2022-02-12: qty 495

## 2022-02-12 MED ORDER — POTASSIUM CHLORIDE 10 MEQ/100ML IV SOLN
10.0000 meq | INTRAVENOUS | Status: AC
  Administered 2022-02-12 (×4): 10 meq via INTRAVENOUS
  Filled 2022-02-12 (×4): qty 100

## 2022-02-12 MED ORDER — RIFAXIMIN 550 MG PO TABS
550.0000 mg | ORAL_TABLET | Freq: Two times a day (BID) | ORAL | Status: DC
  Administered 2022-02-12 – 2022-02-14 (×5): 550 mg via ORAL
  Filled 2022-02-12 (×5): qty 1

## 2022-02-12 MED ORDER — SODIUM CHLORIDE 0.9 % IV SOLN
INTRAVENOUS | Status: DC
Start: 2022-02-12 — End: 2022-02-12

## 2022-02-12 MED ORDER — INSULIN ASPART 100 UNIT/ML IJ SOLN: 0.0000 [IU] | Freq: Four times a day (QID) | INTRAMUSCULAR | Status: DC

## 2022-02-12 NOTE — Progress Notes (Signed)
Spoke with patient's daughter Wanda Curry via telephone to update her on patient's plan of care. [name] is currently at bedside with the patient and aware that patient is now tolerating liquids, I advised her we will hold TPN for now as patient is doing so well with liquid diet, add protein shakes BID and potentially try advancing with some soft foods tomorrow if patient continues to do well. All questions were answered, Ms. Hutcherson verbalized understanding of plan.

## 2022-02-12 NOTE — Progress Notes (Signed)
PHARMACY - TOTAL PARENTERAL NUTRITION CONSULT NOTE   Indication:  intolerance to enteral feeding   Patient Measurements: Height: '5\' 5"'$  (165.1 cm) Weight: 98 kg (216 lb 0.8 oz) IBW/kg (Calculated) : 57 TPN AdjBW (KG): 67.2 Body mass index is 35.95 kg/m. Usual Weight: unknown  Assessment: Patient is a 76 yo female presents with vomiting after EGD. Chronic esophageal dysphagia per GI. Cirrhosis, hepatocellular carcinoma, colon cancer (stage 2), esophageal varices.    02/06/22 Esophageal banding  6/25 SLP evaluation -high aspiration risk due to emesis.  6/26 GI- severe esophageal dysmotility and aspiration at time of procedure per NP. 6/26 paracentesis with removal of 2.6 liters yellow ascitic fluid. The first time per pt. 6/27 PICC placement    Patient NPO.    Denies change in appetite and says home diet consistent cho. Eating pattern 2 meals daily and complains of limited mobility due to pain in left knee. Patient reports eating as usual up to EGD. Then nausea and vomiting since Friday. High risk for malnutrition.  Glucose / Insulin: 148-193, 2 units  Electrolytes: K 3.3, Phos 2.3, Mg 1.6 Renal: Scr 0.69, CrCl 68.25m/min Hepatic: AST ALT WNL  Intake / Output; MIVF: 2452m1300ml GI Imaging: 6/26 Marked esophageal dysmotility, with poor primary and secondary peristaltic waves. GI Surgeries / Procedures: fluoroscopy 6/26   Central access: may 2023  TPN start date: 02/12/22   Nutritional Goals: Goal TPN rate is 75 mL/hr (provides 99 g of protein and 1854 kcals per day) Fluid goal approx [id number] RD Assessment: Estimated Needs Total Energy Estimated Needs: 1800-1900 Total Protein Estimated Needs: 97-103 gr Total Fluid Estimated Needs: per MD goal  Current Nutrition:  NPO and Clear liquids  Plan:  Start TPN at half goal rate, 37.5mL28m at 1800 Electrolytes in TPN: Na 50mE58m K 60mEq46mCa 5mEq/L85mg 7mEq/L,72md Phos 20mmol/L43m:Ac 1:1 Add standard MVI and trace elements  to TPN Initiate Sensitive q6h SSI and adjust as needed  Stop d5 0.45% NaCl 40meq Kcl33mning at 50cc/hr at 6/28 '@1800'$  Change MIVF to sodium chloride 0.9% at 75 mL/hr at 1800 for total fluid intake of 2700ml per 218murs Monitor TPN labs on Mon/Thurs, magnesium, phos, triglycerides  Connee Ikner 02/12/2022,10:49 AM

## 2022-02-12 NOTE — Progress Notes (Signed)
PROGRESS NOTE    Wanda Curry  FVC:[id number] DOB: [birth year] DOA: 02/07/2022 PCP: Lindell Spar, MD   Brief Narrative:    Wanda Curry is a 76 year old female with past history of colon cancer, liver cancer, DM 2, HTN, CKD presenting with intractable nausea vomiting since yesterday.   Patient had an endoscopy by Dr. Gala Romney yesterday 02/06/2022 for evaluation of possible varices.  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  Patient was subsequently discharged home.  Since procedure she has not been able to keep solids or liquids down, started having nausea and vomiting.  Vomitus has been nonbloody nonbilious.  She will be started on TPN and diet will be gradually advanced to see if she tolerates prior to discharge.  Assessment & Plan:   Principal Problem:   Intractable nausea and vomiting Active Problems:   Normocytic anemia   UTI (urinary tract infection)   Acute respiratory failure with hypoxia (HCC)   SIRS (systemic inflammatory response syndrome) (HCC)   Dysphagia   HTN (hypertension)   Colon cancer (HCC)   Malignant neoplasm of colon (HCC)   History of pulmonary embolism   Type 2 diabetes mellitus (HCC)   RLS (restless legs syndrome)   Hepatocellular carcinoma (HCC)   Chronic diarrhea   RA (rheumatoid arthritis) (HCC)   CKD (chronic kidney disease), stage III (HCC)   Gastroesophageal reflux disease   History of DVT (deep vein thrombosis)   Anemia   Alcoholic cirrhosis of liver with ascites (HCC)   Cirrhosis of liver with ascites (HCC)  Assessment and Plan:   Intractable nausea and vomiting - Intractable nausea vomiting  -Still unable to keep liquids or solids down   -Speech evaluated -Status post barium swallow evaluation finding consistent with esophageal emesis motility, lower and esophagus stricture     -DC IV fluids, due to shortness of breath increase edema   -Scheduling IV Reglan 10 mg IV every 8  hours Continue as needed Zofran -We will encourage p.o. intake we will start with clears   -Status post upper endoscopy nephrologist Dr. 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 No real option of treatment at this point -Hoping that this will spontaneously resolve -Continue to try clear liquids with slow advancement   -Thus far unable to tolerate any solids -Start TPN 6/28 -Per GI, will monitor to see if diet can be advanced -Appreciate GI following and further recommendations   SIRS (systemic inflammatory response syndrome) (HCC) Stable now   met SIRS criteria - Overnight 02/09/2022 - developed Tmax 100.9, heart rate of 116, with respirate of 28 blood pressure stable O2 sat 94% on room air, WBC 12.9   -UA indication of urine tract infection chest x-ray possible pneumonia -Urine culture and blood cultures has been sent -Initiated on IV antibiotics of Rocephin 2 g daily   We will monitor closely Sepsis-ruled out   Acute respiratory failure with hypoxia (HCC) Stable this a.m. -- Been off supplemental oxygen, on room air satting 95% now      02/09/2022-overnight  patient developed tachycardia, tachypnea, hypoxia overnight -ABG 7.4 5/39/82/3.2 -Chest x-ray concerning for pneumonia -IV  antibiotic Rocephin has been initiated -Tmax 100.9, blood cultures were obtained   -As needed DuoNeb bronchodilator treatment     UTI (urinary tract infection) Urine on 02/07/2022 revealed large leukocyte esterase, > WBC -We will follow-up with cultures -Patient had IV Rocephin     Normocytic anemia - Acute on chronic  anemia With iron deficiency -Status post banding x3 of esophageal varices    - IV iron 300 mg daily x3 Once tolerating p.o. we will initiate p.o. iron supplements Continue supplement folic acid   -Drop in hemoglobin 6.7--symptomatic with tachycardia -Status post 2U PRBC transfusion 02/08/2022   -Monitoring closely; repeat CBC in AM while on Xarelto     HTN (hypertension) - Currently stable, continue home medication Coreg As needed hydralazine   Alcoholic cirrhosis of liver with ascites ([facility]) Acute on chronic ascites -Status post ultrasound-guided thoracentesis 02/10/2022 yielding 2.5 L -Started on Xifaxan per GI on 6/28   Symptomatic anemia - Status post transfusion of 2 units PRBC, 02/08/2022 Monitoring H&H -   History of DVT (deep vein thrombosis) Continue Xarelto -Is not able to tolerate some of her solid pills cleaning Xarelto No aggressive anticoagulation was pursued due to anemia, varices possible trickling GI bleed from varices pr GI -Was transfused with units of PRBC on this admission     Gastroesophageal reflux disease - Unable to tolerate p.o., due to nausea vomiting, status post upper endoscopy, will initiate IV Protonix for now -When tolerating p.o. we will reinitiate PPI   CKD (chronic kidney disease), stage III (HCC) - Monitoring BUN/creatinine -Creatinine at baseline -Avoiding nephrotoxins   RA (rheumatoid arthritis) (HCC) - Resuming methotrexate and tolerating p.o.   Chronic diarrhea - Chronic diarrhea, resuming her Imodium   Hepatocellular carcinoma ([place]) - Follow-up with oncologist -We will resume home regimen meds once tolerating    RLS (restless legs syndrome) - Continue home regimen of Neurontin and Requip   Type 2 diabetes mellitus ([place]) - Holding home insulin regimen, due to nausea vomiting, poor p.o. intake -Per CBG q. ACH S with SSI coverage -Home medication NovoLog 70/30, Tresiba,  -Hemoglobin A1c proximately year ago 6.5 -Repeat  A1c 6.8 on 02/14/2022   History of pulmonary embolism - Continue Xarelto   Malignant neoplasm of colon [facility]) - Follow-up with oncologist as an outpatient   Colon cancer ([place]) - Currently under treatment, -Currents meds including Atezolizumab, Bevacizumab, methotrexate and folic acid,  -Resume treatment once stable Follow-up with oncologist as outpatient  Hypokalemia/hypomagnesemia -Replete and reevaluate in a.m.   DVT prophylaxis:Xarelto Code Status: Full Family Communication: None at bedside Disposition Plan:  Status is: Inpatient Remains inpatient appropriate because: IV fluids, meds   Consultants:  GI  Procedures:  None  Antimicrobials:  Anti-infectives (From admission, onward)    Start     Dose/Rate Route Frequency Ordered Stop   02/12/22 1000  rifaximin (XIFAXAN) tablet 550 mg        550 mg Oral 2 times daily 02/12/22 0805     02/09/22 0900  cefTRIAXone (ROCEPHIN) 2 g in sodium chloride 0.9 % 100 mL IVPB        2 g 200 mL/hr over 30 Minutes Intravenous Every 24 hours 02/09/22 0808        Subjective: Patient seen and evaluated today with no new acute complaints or concerns. No acute concerns or events noted overnight.  Objective: Vitals:   02/11/22 2020 02/11/22 2052 02/12/22 0456 02/12/22 0532  BP: (!) 145/50 106/90 (!) 145/63   Pulse: 79 81 (!)  103   Resp: $Remo'20 16 18   'JTTnf$ Temp: (!) 97.5 F (36.4 C) 98.1 F (36.7 C) 98.7 F (37.1 C)   TempSrc: Oral Oral Oral   SpO2: 98% 100% 93%   Weight:    98 kg  Height:        Intake/Output Summary (Last 24 hours) at 02/12/2022 1111 Last data filed at 02/12/2022 0920 Gross per 24 hour  Intake 820.39 ml  Output 3100 ml  Net -2279.61 ml   Filed Weights   02/09/22 1342 02/10/22 0609 02/12/22 0532  Weight: 98 kg 98.7 kg 98 kg    Examination:  General exam: Appears calm and comfortable  Respiratory system: Clear to auscultation. Respiratory effort normal. Cardiovascular system: S1 & S2 heard, RRR.   Gastrointestinal system: Abdomen is soft Central nervous system: Alert and awake Extremities: No edema Skin: No significant lesions noted Psychiatry: Flat affect.    Data Reviewed: I have personally reviewed following labs and imaging studies  CBC: Recent Labs  Lab 02/07/22 1207 02/08/22 0409 02/08/22 1450 02/09/22 0430 02/10/22 0433 02/11/22 0309 02/12/22 0444  WBC 5.1   < > 6.7 12.9* 6.9 5.7 4.3  NEUTROABS 3.9  --   --   --   --   --   --   HGB 7.6*   < > 8.5* 8.7* 7.4* 7.6* 7.9*  HCT 24.3*   < > 27.3* 27.9* 24.0* 25.4* 25.6*  MCV 90.3   < > 90.1 90.6 92.0 93.4 92.1  PLT 136*   < > 126* 137* 122* 115* 102*   < > = values in this interval not displayed.   Basic Metabolic Panel: Recent Labs  Lab 02/08/22 0409 02/09/22 0431 02/09/22 0813 02/10/22 0433 02/11/22 0309 02/12/22 0444  NA 142  --  142 145 147* 144  K 3.2*  --  3.1* 2.9* 3.3* 3.3*  CL 112*  --  109 108 109 109  CO2 25  --  $R'26 29 29 29  'xN$ GLUCOSE 144*  --  188* 137* 113* 187*  BUN 13  --  $R'14 19 21 19  'pW$ CREATININE 0.77  --  0.90 0.88 0.76 0.69  CALCIUM 8.1*  --  8.3* 8.7* 8.8* 8.2*  MG  --  1.3*  --  1.9  --  1.6*  PHOS  --   --   --   --   --  2.3*   GFR: Estimated Creatinine Clearance: 68.2 mL/min (by C-G formula based on SCr of 0.69 mg/dL). Liver Function Tests: Recent Labs  Lab 02/07/22 1207 02/09/22 0813 02/10/22 0433 02/12/22 0444  AST 33 $Remo'25 20 20  'FDgJu$ ALT $Rem'23 24 20 17  'izrf$ ALKPHOS 69 64 45 41  BILITOT 1.3* 2.4* 1.4* 1.4*  PROT 5.8* 5.8* 6.2* 4.9*  ALBUMIN 2.3* 2.4* 3.5 2.7*   Recent Labs  Lab 02/07/22 1207  LIPASE 26   No results for input(s): "AMMONIA" in the last 168 hours. Coagulation Profile: Recent Labs  Lab 02/10/22 0433  INR 1.6*   Cardiac Enzymes: No results for input(s): "CKTOTAL", "CKMB", "CKMBINDEX", "TROPONINI" in the last 168 hours. BNP (last 3 results) No results for input(s): "PROBNP" in the last 8760 hours. HbA1C: No results for input(s): "HGBA1C" in the last 72  hours. CBG: Recent Labs  Lab 02/11/22 0729 02/11/22 1134 02/11/22 1620 02/11/22 2023 02/12/22 0749  GLUCAP 128* 148* 167* 185* 193*   Lipid Profile: Recent Labs    02/12/22 0444  TRIG 61   Thyroid Function Tests: No results  for input(s): "TSH", "T4TOTAL", "FREET4", "T3FREE", "THYROIDAB" in the last 72 hours. Anemia Panel: No results for input(s): "VITAMINB12", "FOLATE", "FERRITIN", "TIBC", "IRON", "RETICCTPCT" in the last 72 hours. Sepsis Labs: No results for input(s): "PROCALCITON", "LATICACIDVEN" in the last 168 hours.  Recent Results (from the past 240 hour(s))  Urine Culture     Status: Abnormal   Collection Time: 02/09/22  8:07 AM   Specimen: Urine, Clean Catch  Result Value Ref Range Status   Specimen Description   Final    URINE, CLEAN CATCH Performed at [facility], [address] [place], [place] [address]    Special Requests   Final    NONE Performed at [facility], [address] [place], [place] [address]    Culture >=100,000 COLONIES/mL KLEBSIELLA PNEUMONIAE (A)  Final   Report Status 02/11/2022 FINAL  Final   Organism ID, Bacteria KLEBSIELLA PNEUMONIAE (A)  Final      Susceptibility   Klebsiella pneumoniae - MIC*    AMPICILLIN RESISTANT Resistant     CEFAZOLIN <=4 SENSITIVE Sensitive     CEFEPIME <=0.12 SENSITIVE Sensitive     CEFTRIAXONE <=0.25 SENSITIVE Sensitive     CIPROFLOXACIN <=0.25 SENSITIVE Sensitive     GENTAMICIN <=1 SENSITIVE Sensitive     IMIPENEM <=0.25 SENSITIVE Sensitive     NITROFURANTOIN 128 RESISTANT Resistant     TRIMETH/SULFA <=20 SENSITIVE Sensitive     AMPICILLIN/SULBACTAM 4 SENSITIVE Sensitive     PIP/TAZO <=4 SENSITIVE Sensitive     * >=100,000 COLONIES/mL KLEBSIELLA PNEUMONIAE  Culture, blood (Routine X 2) w Reflex to ID Panel     Status: None (Preliminary result)   Collection Time: 02/09/22  8:09 AM   Specimen: BLOOD RIGHT HAND  Result Value Ref Range Status   Specimen Description   Final    BLOOD RIGHT HAND BOTTLES  DRAWN AEROBIC AND ANAEROBIC   Special Requests   Final    Blood Culture results may not be optimal due to an excessive volume of blood received in culture bottles   Culture   Final    NO GROWTH 3 DAYS Performed at [facility], [address] [place], [place] [address]    Report Status PENDING  Incomplete  Culture, blood (Routine X 2) w Reflex to ID Panel     Status: None (Preliminary result)   Collection Time: 02/09/22  8:11 AM   Specimen: BLOOD LEFT HAND  Result Value Ref Range Status   Specimen Description   Final    BLOOD LEFT HAND BOTTLES DRAWN AEROBIC AND ANAEROBIC   Special Requests   Final    Blood Culture results may not be optimal due to an excessive volume of blood received in culture bottles   Culture   Final    NO GROWTH 3 DAYS Performed at [facility], [address], [place], [place] [address]    Report Status PENDING  Incomplete  MRSA Next Gen by PCR, Nasal     Status: None   Collection Time: 02/09/22  1:15 PM   Specimen: Nasal Mucosa; Nasal Swab  Result Value Ref Range Status   MRSA by PCR Next Gen NOT DETECTED NOT DETECTED Final    Comment: (NOTE) The GeneXpert MRSA Assay (FDA approved for NASAL specimens only), is one component of a comprehensive MRSA colonization surveillance program. It is not intended to diagnose MRSA infection nor to guide or monitor treatment for MRSA infections. Test performance is not FDA approved in patients less than 14 years old. Performed at  [facility], [address], [place], [place] [address]   Culture, body fluid w Gram Stain-bottle     Status: None (Preliminary result)   Collection Time: 02/10/22  1:30 PM   Specimen: Peritoneal Washings  Result Value Ref Range Status   Specimen Description PERITONEAL  Final   Special Requests   Final    BOTTLES DRAWN AEROBIC AND ANAEROBIC Blood Culture adequate volume   Culture   Final    NO GROWTH 2 DAYS Performed at [facility], [address] [place], [place] [address]    Report  Status PENDING  Incomplete  Gram stain     Status: None   Collection Time: 02/10/22  1:30 PM   Specimen: Peritoneal Washings  Result Value Ref Range Status   Specimen Description PERITONEAL  Final   Special Requests NONE  Final   Gram Stain   Final    WBC PRESENT, PREDOMINANTLY PMN NO ORGANISMS SEEN CYTOSPIN SMEAR Performed at [facility], [address], [place], [place] [address]    Report Status 02/10/2022 FINAL  Final         Radiology Studies: [place] EKG SITE RITE  Result Date: 02/11/2022 If Site Rite image not attached, placement could not be confirmed due to current cardiac rhythm.  US Paracentesis  Result Date: 02/10/2022 INDICATION: Ascites, colon cancer EXAM: ULTRASOUND GUIDED DIAGNOSTIC AND THERAPEUTIC PARACENTESIS MEDICATIONS: None. COMPLICATIONS: None immediate. PROCEDURE: Informed written consent was obtained from the patient after a discussion of the risks, benefits and alternatives to treatment. A timeout was performed prior to the initiation of the procedure. Initial ultrasound scanning demonstrates a large amount of ascites within the right lower abdominal quadrant. The right lower abdomen was prepped and draped in the usual sterile fashion. 1% lidocaine was used for local anesthesia. Following this, a 10 cm length 5 [place] Yueh catheter was introduced. An ultrasound image was saved for documentation purposes. The paracentesis was performed. The catheter was removed and a dressing was applied. The patient tolerated the procedure well without immediate post procedural complication. FINDINGS: A total of approximately 2.6 L of clear yellow ascitic fluid was removed. Samples were sent to the laboratory as requested by the clinical team. IMPRESSION: Successful ultrasound-guided paracentesis yielding 2.6 liters of peritoneal fluid. Electronically Signed   By: Lavonia Dana M.D.   On: 02/10/2022 14:36        Scheduled Meds:  Chlorhexidine Gluconate Cloth  6 each Topical Daily    folic acid  1 mg Oral Once per day on Mon Tue Wed Thu Fri Sat   furosemide  20 mg Intravenous Once   furosemide  20 mg Intravenous Daily   insulin aspart  0-6 Units Subcutaneous Q4H   metoprolol tartrate  5 mg Intravenous Q8H   pantoprazole (PROTONIX) IV  40 mg Intravenous Q12H   potassium chloride  20 mEq Oral Once   prochlorperazine  10 mg Intravenous Q6H   rifaximin  550 mg Oral BID   rivaroxaban  20 mg Oral Q supper   sodium chloride flush  3 mL Intravenous Q12H   Continuous Infusions:  sodium chloride     cefTRIAXone (ROCEPHIN)  IV 2 g (02/12/22 0754)   dextrose 5 % and 0.45 % NaCl with KCl 40 mEq/L 50 mL/hr at 02/12/22 0550   potassium chloride 10 mEq (02/12/22 1017)   TPN ADULT (ION)       LOS: 4 days    Time spent: 35 minutes    Jazmen Lindenbaum Darleen Crocker, DO [facility]  If 7PM-7AM, please contact night-coverage [email or web address] 02/12/2022, 11:11 AM

## 2022-02-12 NOTE — Progress Notes (Signed)
TPN is on hold for now per MD

## 2022-02-12 NOTE — Progress Notes (Signed)
PT has tolerated full liquid diet without any nausea or vomiting

## 2022-02-12 NOTE — Progress Notes (Addendum)
Subjective: Patient reports she is doing very well today, was able to tolerate an entire cup of ice chips without any nausea or regurgitation, and is very excited about this. She denies any pain. Has not had a BM in 1 week. She is a/ox4 at this time.   Objective: Vital signs in last 24 hours: Temp:  [97.5 F (36.4 C)-98.7 F (37.1 C)] 98.7 F (37.1 C) (06/28 0456) Pulse Rate:  [79-103] 103 (06/28 0456) Resp:  [11-29] 18 (06/28 0456) BP: (106-169)/(40-90) 145/63 (06/28 0456) SpO2:  [89 %-100 %] 93 % (06/28 0456) Weight:  [98 kg] 98 kg (06/28 0532) Last BM Date : 02/07/22 General:   Alert and oriented, pleasant Head:  Normocephalic and atraumatic. Eyes:  No icterus, sclera clear. Conjuctiva pink.  Mouth:  Without lesions, mucosa pink and moist.  Heart:  S1, S2 present, no murmurs noted.  Lungs: Clear to auscultation bilaterally, without wheezing, rales, or rhonchi.  Abdomen:  Bowel sounds present, soft, non-tender, mild ascites present. No HSM or hernias noted. No rebound or guarding. No masses appreciated  Msk:  Symmetrical without gross deformities. Normal posture. Pulses:  Normal pulses noted. Extremities:  2+ pitting edema present to LEs Neurologic:  Alert and  oriented x4;  grossly normal neurologically. No asterixis noted on exam.  Skin:  Warm and dry, intact without significant lesions.  Psych:  Alert and cooperative. Normal mood and affect.  Intake/Output from previous day: 06/27 0701 - 06/28 0700 In: 580.4 [I.V.:580.4] Out: 1800 [Urine:1800] Intake/Output this shift: No intake/output data recorded.  Lab Results: Recent Labs    02/10/22 0433 02/11/22 0309 02/12/22 0444  WBC 6.9 5.7 4.3  HGB 7.4* 7.6* 7.9*  HCT 24.0* 25.4* 25.6*  PLT 122* 115* 102*   BMET Recent Labs    02/10/22 0433 02/11/22 0309 02/12/22 0444  NA 145 147* 144  K 2.9* 3.3* 3.3*  CL 108 109 109  CO2 '29 29 29  '$ GLUCOSE 137* 113* 187*  BUN '19 21 19  '$ CREATININE 0.88 0.76 0.69  CALCIUM  8.7* 8.8* 8.2*   LFT Recent Labs    02/10/22 0433 02/12/22 0444  PROT 6.2* 4.9*  ALBUMIN 3.5 2.7*  AST 20 20  ALT 20 17  ALKPHOS 45 41  BILITOT 1.4* 1.4*   PT/INR Recent Labs    02/10/22 0433  LABPROT 18.4*  INR 1.6*    Studies/Results: [place] EKG SITE RITE  Result Date: 02/11/2022 If Site Rite image not attached, placement could not be confirmed due to current cardiac rhythm.  US Paracentesis  Result Date: 02/10/2022 INDICATION: Ascites, colon cancer EXAM: ULTRASOUND GUIDED DIAGNOSTIC AND THERAPEUTIC PARACENTESIS MEDICATIONS: None. COMPLICATIONS: None immediate. PROCEDURE: Informed written consent was obtained from the patient after a discussion of the risks, benefits and alternatives to treatment. A timeout was performed prior to the initiation of the procedure. Initial ultrasound scanning demonstrates a large amount of ascites within the right lower abdominal quadrant. The right lower abdomen was prepped and draped in the usual sterile fashion. 1% lidocaine was used for local anesthesia. Following this, a 10 cm length 5 [place] Yueh catheter was introduced. An ultrasound image was saved for documentation purposes. The paracentesis was performed. The catheter was removed and a dressing was applied. The patient tolerated the procedure well without immediate post procedural complication. FINDINGS: A total of approximately 2.6 L of clear yellow ascitic fluid was removed. Samples were sent to the laboratory as requested by the clinical team. IMPRESSION: Successful ultrasound-guided paracentesis yielding 2.6  liters of peritoneal fluid. Electronically Signed   By: Lavonia Dana M.D.   On: 02/10/2022 14:36   DG Chest 1 View  Result Date: 02/10/2022 CLINICAL DATA:  Aspiration EXAM: CHEST  1 VIEW COMPARISON:  02/09/2022 FINDINGS: Retention of contrast within the thoracic esophagus from preceding esophagram. No contrast seen within stomach, cannot exclude esophageal obstruction or achalasia. Normal  heart size and mediastinal contours. Atherosclerotic calcification aorta. Patchy BILATERAL pulmonary infiltrates again seen. Aspirated contrast identified within the LEFT mainstem bronchus extending to the central airways in the perihilar region. No pleural effusion or pneumothorax. IMPRESSION: Aspirated contrast material within the central airways of the LEFT lung. Persistent patchy BILATERAL pulmonary infiltrates. Prolonged thoracic esophageal retention of contrast material, esophagus appearing mildly dilated, without contrast seen within the stomach, question achalasia versus distal esophageal obstruction. Electronically Signed   By: Lavonia Dana M.D.   On: 02/10/2022 10:56   DG ESOPHAGUS W SINGLE CM (SOL OR THIN BA)  Result Date: 02/10/2022 CLINICAL DATA:  Dysphagia, concern for aspiration, history liver cancer, nausea and vomiting since endoscopy EXAM: ESOPHOGRAM/BARIUM SWALLOW TECHNIQUE: Single contrast examination was performed using  thin barium. FLUOROSCOPY: Radiation Exposure Index (as provided by the fluoroscopic device): 30.4 mGy Kerma COMPARISON:  None Available. FINDINGS: Marked esophageal dysmotility, with poor primary and secondary peristaltic waves. Prolonged retention of contrast within a mildly dilated thoracic esophagus. After 3 swallows of barium, patient became nauseated and vomited a portion of the contrast. A portion of the contrast was also aspirated into the trachea, LEFT mainstem bronchus and extending towards LEFT lower lobe, observed under fluoroscopy though these images were not saved. Procedure terminated. Oxygen saturation remained 95-97% following suctioning. Extensive atherosclerotic calcification of aortic arch. IMPRESSION: Marked esophageal dysmotility with a mildly dilated thoracic esophagus which demonstrates prolonged retention of contrast. Aspiration of contrast into trachea, LEFT mainstem bronchus and proximal LEFT lower lobe bronchus. Electronically Signed   By: Lavonia Dana M.D.   On: 02/10/2022 10:54    Assessment: Wanda Curry, is a 76 y.o. 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 She was admitted with intractable N/V, acute on chronic anemia, developed acute respiratory failure in setting of pneumonia over the weekend.  Nausea/Vomiting/regurgitation: secondary to esophageal dysmotility as seen on UGI series monday, worsened after recent variceal banding and not improved with Reglan or Compazine. Dr. Jenetta Downer discussed case with Dr. Constance Haw from General surgery, Her and Dr. Jenetta Downer both agreed that PEG tube placement would be too risky and G-tube placement would likely require tertiary center evaluation, plan yesterday was to start TPN this evening and consider PICC line for parenteral feeding, however, Pt tolerating ice chips this morning, was able to keep down an entire cup without any regurgitation. Nurse at bedside also reports she was able to tolerate her pills without issue. She is hungry. Will transition to liquid diet today to see how she tolerates, can consider gradually advancing diet if she is able to tolerate liquids without issue. If patient does well with liquid diet, recommend holding TPN and adding in protein shakes BID until she can transition to solids.   Cirrhosis:MELD remains 13 today, INR elevated at 1.6 this morning. moderate to large volume ascites on CT this admission, s/p para Monday yielding 2.6L, negative for SBP.  With mild to moderate, non taut ascites today. Continued peripheral edema. Started on '20mg'$  IV lasix yesterday. Will need to closely monitor hypokalemia at this time, K+ remains at 3.3. xifaxan started this morning by Dr. Jenetta Downer for concerns of HE. patient is  a/ox4 with no asterixis on my exam at this time.   Acute on chronic anemia: hgb normal at 14.6 04/2021, down to 10.1 12/2021, 7.6 on admission, 6.7 on 6/24 s/p 1 unit PRBCs, though continuing to fluctuate up and down between high 7 to 8 range since, 7.9 today with MCV 92.1. on 6/15 folate was 14.8, B12 450, Ferritin 42, Iron 23, TIBC 243, saturation 9, received Iron infusion on 6/23. Also currently receiving immunotherapy, Atezolizumab and Bevacizumab, possibly influenced by this, last colonoscopy 07/2017 with pancolonic diverticulosis. No overt bleeding, considerations for outpatient colonoscopy if anemia continues.    Plan: MELD labs and INR daily Will start liquid diet today, consider slow transition of diet if patient is tolerating liquids today Recommend holding TPN if pt tolerates liquid diet Boost/ensure shakes BID if holding TPN Trend H&H daily, transfuse for hgb <7 Monitor for signs of HE Monitor for overt GI bleeding Follow closely for worsening hypokalemia, hospitalist to manage Xifaxan '550mg'$  BID Considerations for outpatient colonoscopy for evaluation of anemia Can restart Xarelto, per Dr. Jenetta Downer  Continue lasix '20mg'$  daily    LOS: 4 days    02/12/2022, 9:09 AM  Morenike Cuff L. Alver Sorrow, MSN, APRN, AGNP-C Adult-Gerontology Nurse Practitioner [facility]

## 2022-02-12 NOTE — Inpatient Diabetes Management (Signed)
Inpatient Diabetes Program Recommendations  AACE/ADA: New Consensus Statement on Inpatient Glycemic Control   Target Ranges:  Prepandial:   less than 140 mg/dL      Peak postprandial:   less than 180 mg/dL (1-2 hours)      Critically ill patients:  140 - 180 mg/dL    Latest Reference Range & Units 02/11/22 07:29 02/11/22 11:34 02/11/22 16:20 02/11/22 20:23 02/12/22 07:49  Glucose-Capillary 70 - 99 mg/dL 128 (H) 148 (H) 167 (H) 185 (H) 193 (H)   Review of Glycemic Control  Diabetes history: DM2 Outpatient Diabetes medications: Toujeo 40 units QHS, 70/30 30 units QAM, 70/30 20 units QPM Current orders for Inpatient glycemic control: Novolog 0-6 units TID with meals  Inpatient Diabetes Program Recommendations:    Insulin; Noted patient NPO and will be started on TPN. Please change frequency of CBGs to Q4H and Novolog 0-6 units to Q4H.  Thanks, Barnie Alderman, RN, MSN, [place] Diabetes Coordinator Inpatient Diabetes Program [phone] (Team Pager from 8am to [place])

## 2022-02-13 ENCOUNTER — Telehealth: Payer: Self-pay | Admitting: Gastroenterology

## 2022-02-13 ENCOUNTER — Inpatient Hospital Stay (HOSPITAL_COMMUNITY): Payer: Medicare Other

## 2022-02-13 DIAGNOSIS — R112 Nausea with vomiting, unspecified: Secondary | ICD-10-CM | POA: Diagnosis not present

## 2022-02-13 LAB — CBC
HCT: 26.8 % — ABNORMAL LOW (ref 36.0–46.0)
Hemoglobin: 8.1 g/dL — ABNORMAL LOW (ref 12.0–15.0)
MCH: 28.4 pg (ref 26.0–34.0)
MCHC: 30.2 g/dL (ref 30.0–36.0)
MCV: 94 fL (ref 80.0–100.0)
Platelets: 98 10*3/uL — ABNORMAL LOW (ref 150–400)
RBC: 2.85 MIL/uL — ABNORMAL LOW (ref 3.87–5.11)
RDW: 17.9 % — ABNORMAL HIGH (ref 11.5–15.5)
WBC: 4.9 10*3/uL (ref 4.0–10.5)
nRBC: 0 % (ref 0.0–0.2)

## 2022-02-13 LAB — COMPREHENSIVE METABOLIC PANEL
ALT: 18 U/L (ref 0–44)
AST: 21 U/L (ref 15–41)
Albumin: 2.5 g/dL — ABNORMAL LOW (ref 3.5–5.0)
Alkaline Phosphatase: 43 U/L (ref 38–126)
Anion gap: 4 — ABNORMAL LOW (ref 5–15)
BUN: 16 mg/dL (ref 8–23)
CO2: 30 mmol/L (ref 22–32)
Calcium: 8.1 mg/dL — ABNORMAL LOW (ref 8.9–10.3)
Chloride: 107 mmol/L (ref 98–111)
Creatinine, Ser: 0.65 mg/dL (ref 0.44–1.00)
GFR, Estimated: >60 mL/min (ref >60)
Glucose, Bld: 179 mg/dL — ABNORMAL HIGH (ref 70–99)
Potassium: 3.6 mmol/L (ref 3.5–5.1)
Sodium: 141 mmol/L (ref 135–145)
Total Bilirubin: 1 mg/dL (ref 0.3–1.2)
Total Protein: 4.8 g/dL — ABNORMAL LOW (ref 6.5–8.1)

## 2022-02-13 LAB — GLUCOSE, CAPILLARY
Glucose-Capillary: 184 mg/dL — ABNORMAL HIGH (ref 70–99)
Glucose-Capillary: 197 mg/dL — ABNORMAL HIGH (ref 70–99)
Glucose-Capillary: 216 mg/dL — ABNORMAL HIGH (ref 70–99)
Glucose-Capillary: 242 mg/dL — ABNORMAL HIGH (ref 70–99)
Glucose-Capillary: 253 mg/dL — ABNORMAL HIGH (ref 70–99)
Glucose-Capillary: 279 mg/dL — ABNORMAL HIGH (ref 70–99)

## 2022-02-13 LAB — AMMONIA: Ammonia: 36 umol/L — ABNORMAL HIGH (ref 9–35)

## 2022-02-13 MED ORDER — LOPERAMIDE HCL 2 MG PO CAPS
2.0000 mg | ORAL_CAPSULE | ORAL | Status: DC | PRN
  Administered 2022-02-13: 2 mg via ORAL
  Filled 2022-02-13: qty 1

## 2022-02-13 MED ORDER — LACTULOSE 10 GM/15ML PO SOLN
20.0000 g | Freq: Every day | ORAL | Status: DC
  Administered 2022-02-13 – 2022-02-14 (×2): 20 g via ORAL
  Filled 2022-02-13 (×2): qty 30

## 2022-02-13 NOTE — Progress Notes (Signed)
PROGRESS NOTE    Wanda Curry  KYH:[id number] DOB: 1946-03-28 DOA: 02/07/2022 PCP: Lindell Spar, MD   Brief Narrative:    Wanda Curry is a 76 year old female with past history of colon cancer, liver cancer, DM 2, HTN, CKD presenting with intractable nausea vomiting since yesterday.   Patient had an endoscopy by Dr. Gala Romney yesterday 02/06/2022 for evaluation of possible varices.  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  Patient was subsequently discharged home.  Since procedure she has not been able to keep solids or liquids down, started having nausea and vomiting.  Vomitus has been nonbloody nonbilious.  She will be started on TPN and diet will be gradually advanced to see if she tolerates prior to discharge.  Assessment & Plan:   Principal Problem:   Intractable nausea and vomiting Active Problems:   Normocytic anemia   UTI (urinary tract infection)   Acute respiratory failure with hypoxia (HCC)   SIRS (systemic inflammatory response syndrome) (HCC)   Dysphagia   HTN (hypertension)   Colon cancer (HCC)   Malignant neoplasm of colon (HCC)   History of pulmonary embolism   Type 2 diabetes mellitus (HCC)   RLS (restless legs syndrome)   Hepatocellular carcinoma (HCC)   Chronic diarrhea   RA (rheumatoid arthritis) (HCC)   CKD (chronic kidney disease), stage III (HCC)   Gastroesophageal reflux disease   History of DVT (deep vein thrombosis)   Anemia   Alcoholic cirrhosis of liver with ascites (HCC)   Cirrhosis of liver with ascites (HCC)  Assessment and Plan:     Intractable nausea and vomiting-resolved -Now tolerating full liquids and diet being advanced to soft  Dysphagia - Status post barium swallow evaluation, resulted in aspiration of the dye -Solids compatible with dysmotility of esophagus, with possible lower esophagus stricture -Findings were discussed with the GI... No real option of treatment at this  point -Hoping that this will spontaneously resolve -Advancing diet to soft as full liquids were tolerated   SIRS (systemic inflammatory response syndrome) (HCC) Stable now   met SIRS criteria - Overnight 02/09/2022 - developed Tmax 100.9, heart rate of 116, with respirate of 28 blood pressure stable O2 sat 94% on room air, WBC 12.9   -UA indication of urine tract infection chest x-ray possible pneumonia -Urine culture and blood cultures has been sent -Initiated on IV antibiotics of Rocephin 2 g daily   We will monitor closely Sepsis-ruled out   Acute respiratory failure with hypoxia (HCC) Stable this a.m. -- Been off supplemental oxygen, on room air satting 95% now      02/09/2022-overnight  patient developed tachycardia, tachypnea, hypoxia overnight -ABG 7.4 5/39/82/3.2 -Chest x-ray concerning for pneumonia -IV antibiotic Rocephin has been initiated -Tmax 100.9, blood cultures were obtained   -As needed DuoNeb bronchodilator treatment     UTI (urinary tract infection) Urine on 02/07/2022 revealed large leukocyte esterase, > WBC -We will follow-up with cultures -Patient had IV Rocephin     Normocytic anemia - Acute on chronic anemia With iron deficiency -Status post banding x3 of esophageal varices    - IV iron 300 mg daily x3 Once tolerating p.o. we will initiate p.o. iron supplements Continue supplement folic acid   -Drop in hemoglobin 6.7--symptomatic with tachycardia -Status post 2U PRBC transfusion 02/08/2022   -Monitoring closely; repeat CBC in AM while on Xarelto     HTN (hypertension) - Currently stable, continue home medication Coreg As needed  hydralazine   Alcoholic cirrhosis of liver with ascites (HCC) Acute on chronic ascites -Status post ultrasound-guided thoracentesis 02/10/2022 yielding 2.5 L -Started on Xifaxan per GI on 6/28   Symptomatic anemia - Status post transfusion of 2 units PRBC, 02/08/2022 Monitoring H&H -   History of DVT (deep  vein thrombosis) Continue Xarelto -Is not able to tolerate some of her solid pills cleaning Xarelto No aggressive anticoagulation was pursued due to anemia, varices possible trickling GI bleed from varices pr GI -Was transfused with units of PRBC on this admission     Gastroesophageal reflux disease - Unable to tolerate p.o., due to nausea vomiting, status post upper endoscopy, will initiate IV Protonix for now -When tolerating p.o. we will reinitiate PPI   CKD (chronic kidney disease), stage III (HCC) - Monitoring BUN/creatinine -Creatinine at baseline -Avoiding nephrotoxins   RA (rheumatoid arthritis) (HCC) - Resuming methotrexate and tolerating p.o.   Chronic diarrhea - Chronic diarrhea, resuming her Imodium   Hepatocellular carcinoma ([place]) - Follow-up with oncologist -We will resume home regimen meds once tolerating    RLS (restless legs syndrome) - Continue home regimen of Neurontin and Requip   Type 2 diabetes mellitus ([place]) - Holding home insulin regimen, due to nausea vomiting, poor p.o. intake -Per CBG q. [place] with SSI coverage -Home medication NovoLog 70/30, Tresiba,  -Hemoglobin A1c proximately year ago 6.5 -Repeat A1c 6.8 on 02/14/2022   History of pulmonary embolism - Continue Xarelto   Malignant neoplasm of colon ([place]) - Follow-up with oncologist as an outpatient   Colon cancer ([place]) - Currently under treatment, -Currents meds including Atezolizumab, Bevacizumab, methotrexate and folic acid,  -Resume treatment once stable Follow-up with oncologist as outpatient    DVT prophylaxis:Xarelto Code Status: Full Family Communication: None at bedside Disposition Plan:  Status is: Inpatient Remains inpatient appropriate because: IV fluids, meds     Consultants:  GI   Procedures:  None  Antimicrobials:  Anti-infectives (From admission, onward)    Start     Dose/Rate Route Frequency Ordered Stop   02/12/22 1000  rifaximin (XIFAXAN) tablet 550 mg         550 mg Oral 2 times daily 02/12/22 0805     02/09/22 0900  cefTRIAXone (ROCEPHIN) 2 g in sodium chloride 0.9 % 100 mL IVPB        2 g 200 mL/hr over 30 Minutes Intravenous Every 24 hours 02/09/22 0808         Subjective: Patient seen and evaluated today with no new acute complaints or concerns. No acute concerns or events noted overnight.  Tolerating full liquid diet and would like to try more.  Objective: Vitals:   02/12/22 2327 02/13/22 0451 02/13/22 0600 02/13/22 1246  BP: (!) 135/53 (!) 121/54 (!) 122/55 (!) 124/50  Pulse: 90 93 93 (!) 107  Resp:  19  18  Temp:  98.5 F (36.9 C)  98.6 F (37 C)  TempSrc:    Oral  SpO2:  97%  90%  Weight:   100 kg   Height:        Intake/Output Summary (Last 24 hours) at 02/13/2022 1324 Last data filed at 02/13/2022 0943 Gross per 24 hour  Intake 3666.28 ml  Output 2550 ml  Net 1116.28 ml   Filed Weights   02/10/22 0609 02/12/22 0532 02/13/22 0600  Weight: 98.7 kg 98 kg 100 kg    Examination:  General exam: Appears calm and comfortable  Respiratory system: Clear to auscultation. Respiratory effort  normal. Cardiovascular system: S1 & S2 heard, RRR.  Gastrointestinal system: Abdomen is soft Central nervous system: Alert and awake Extremities: No edema Skin: No significant lesions noted Psychiatry: Flat affect.    Data Reviewed: I have personally reviewed following labs and imaging studies  CBC: Recent Labs  Lab 02/07/22 1207 02/08/22 0409 02/09/22 0430 02/10/22 0433 02/11/22 0309 02/12/22 0444 02/13/22 0434  WBC 5.1   < > 12.9* 6.9 5.7 4.3 4.9  NEUTROABS 3.9  --   --   --   --   --   --   HGB 7.6*   < > 8.7* 7.4* 7.6* 7.9* 8.1*  HCT 24.3*   < > 27.9* 24.0* 25.4* 25.6* 26.8*  MCV 90.3   < > 90.6 92.0 93.4 92.1 94.0  PLT 136*   < > 137* 122* 115* 102* 98*   < > = values in this interval not displayed.   Basic Metabolic Panel: Recent Labs  Lab 02/09/22 0431 02/09/22 0813 02/10/22 0433 02/11/22 0309  02/12/22 0444 02/13/22 0434  NA  --  142 145 147* 144 141  K  --  3.1* 2.9* 3.3* 3.3* 3.6  CL  --  109 108 109 109 107  CO2  --  $R'26 29 29 29 30  'xv$ GLUCOSE  --  188* 137* 113* 187* 179*  BUN  --  $R'14 19 21 19 16  'mg$ CREATININE  --  0.90 0.88 0.76 0.69 0.65  CALCIUM  --  8.3* 8.7* 8.8* 8.2* 8.1*  MG 1.3*  --  1.9  --  1.6*  --   PHOS  --   --   --   --  2.3*  --    GFR: Estimated Creatinine Clearance: 69 mL/min (by C-G formula based on SCr of 0.65 mg/dL). Liver Function Tests: Recent Labs  Lab 02/07/22 1207 02/09/22 0813 02/10/22 0433 02/12/22 0444 02/13/22 0434  AST 33 $Remo'25 20 20 21  'ebQrk$ ALT $Rem'23 24 20 17 18  'Iqfb$ ALKPHOS 69 64 45 41 43  BILITOT 1.3* 2.4* 1.4* 1.4* 1.0  PROT 5.8* 5.8* 6.2* 4.9* 4.8*  ALBUMIN 2.3* 2.4* 3.5 2.7* 2.5*   Recent Labs  Lab 02/07/22 1207  LIPASE 26   Recent Labs  Lab 02/13/22 0434  AMMONIA 36*   Coagulation Profile: Recent Labs  Lab 02/10/22 0433  INR 1.6*   Cardiac Enzymes: No results for input(s): "CKTOTAL", "CKMB", "CKMBINDEX", "TROPONINI" in the last 168 hours. BNP (last 3 results) No results for input(s): "PROBNP" in the last 8760 hours. HbA1C: No results for input(s): "HGBA1C" in the last 72 hours. CBG: Recent Labs  Lab 02/12/22 2119 02/13/22 0031 02/13/22 0454 02/13/22 0838 02/13/22 1133  GLUCAP 273* 197* 184* 216* 253*   Lipid Profile: Recent Labs    02/12/22 0444  TRIG 61   Thyroid Function Tests: No results for input(s): "TSH", "T4TOTAL", "FREET4", "T3FREE", "THYROIDAB" in the last 72 hours. Anemia Panel: No results for input(s): "VITAMINB12", "FOLATE", "FERRITIN", "TIBC", "IRON", "RETICCTPCT" in the last 72 hours. Sepsis Labs: No results for input(s): "PROCALCITON", "LATICACIDVEN" in the last 168 hours.  Recent Results (from the past 240 hour(s))  Urine Culture     Status: Abnormal   Collection Time: 02/09/22  8:07 AM   Specimen: Urine, Clean Catch  Result Value Ref Range Status   Specimen Description   Final     URINE, CLEAN CATCH Performed at [facility], [address], [place], [place] [address]    Special Requests   Final  NONE Performed at [facility], [address] [place], [place] [address]    Culture >=100,000 COLONIES/mL KLEBSIELLA PNEUMONIAE (A)  Final   Report Status 02/11/2022 FINAL  Final   Organism ID, Bacteria KLEBSIELLA PNEUMONIAE (A)  Final      Susceptibility   Klebsiella pneumoniae - MIC*    AMPICILLIN RESISTANT Resistant     CEFAZOLIN <=4 SENSITIVE Sensitive     CEFEPIME <=0.12 SENSITIVE Sensitive     CEFTRIAXONE <=0.25 SENSITIVE Sensitive     CIPROFLOXACIN <=0.25 SENSITIVE Sensitive     GENTAMICIN <=1 SENSITIVE Sensitive     IMIPENEM <=0.25 SENSITIVE Sensitive     NITROFURANTOIN 128 RESISTANT Resistant     TRIMETH/SULFA <=20 SENSITIVE Sensitive     AMPICILLIN/SULBACTAM 4 SENSITIVE Sensitive     PIP/TAZO <=4 SENSITIVE Sensitive     * >=100,000 COLONIES/mL KLEBSIELLA PNEUMONIAE  Culture, blood (Routine X 2) w Reflex to ID Panel     Status: None (Preliminary result)   Collection Time: 02/09/22  8:09 AM   Specimen: BLOOD RIGHT HAND  Result Value Ref Range Status   Specimen Description   Final    BLOOD RIGHT HAND BOTTLES DRAWN AEROBIC AND ANAEROBIC   Special Requests   Final    Blood Culture results may not be optimal due to an excessive volume of blood received in culture bottles   Culture   Final    NO GROWTH 4 DAYS Performed at [facility], [address], [place], [place] [address]    Report Status PENDING  Incomplete  Culture, blood (Routine X 2) w Reflex to ID Panel     Status: None (Preliminary result)   Collection Time: 02/09/22  8:11 AM   Specimen: BLOOD LEFT HAND  Result Value Ref Range Status   Specimen Description   Final    BLOOD LEFT HAND BOTTLES DRAWN AEROBIC AND ANAEROBIC   Special Requests   Final    Blood Culture results may not be optimal due to an excessive volume of blood received in culture bottles   Culture   Final    NO GROWTH 4  DAYS Performed at [facility], [address] [place], [place] [address]    Report Status PENDING  Incomplete  MRSA Next Gen by PCR, Nasal     Status: None   Collection Time: 02/09/22  1:15 PM   Specimen: Nasal Mucosa; Nasal Swab  Result Value Ref Range Status   MRSA by PCR Next Gen NOT DETECTED NOT DETECTED Final    Comment: (NOTE) The GeneXpert MRSA Assay (FDA approved for NASAL specimens only), is one component of a comprehensive MRSA colonization surveillance program. It is not intended to diagnose MRSA infection nor to guide or monitor treatment for MRSA infections. Test performance is not FDA approved in patients less than 18 years old. Performed at [facility], [address] [place], [place] [address]   Culture, body fluid w Gram Stain-bottle     Status: None (Preliminary result)   Collection Time: 02/10/22  1:30 PM   Specimen: Peritoneal Washings  Result Value Ref Range Status   Specimen Description PERITONEAL  Final   Special Requests   Final    BOTTLES DRAWN AEROBIC AND ANAEROBIC Blood Culture adequate volume   Culture   Final    NO GROWTH 3 DAYS Performed at [facility], [address] [place], [place] [address]    Report Status PENDING  Incomplete  Gram stain     Status: None   Collection  Time: 02/10/22  1:30 PM   Specimen: Peritoneal Washings  Result Value Ref Range Status   Specimen Description PERITONEAL  Final   Special Requests NONE  Final   Gram Stain   Final    WBC PRESENT, PREDOMINANTLY PMN NO ORGANISMS SEEN CYTOSPIN SMEAR Performed at [facility], [address] [place], [place] [address]    Report Status 02/10/2022 FINAL  Final         Radiology Studies: [place] EKG SITE RITE  Result Date: 02/11/2022 If Site Rite image not attached, placement could not be confirmed due to current cardiac rhythm.       Scheduled Meds:  Chlorhexidine Gluconate Cloth  6 each Topical Daily   folic acid  1 mg Oral Once per day on Mon Tue Wed Thu Fri Sat    furosemide  20 mg Intravenous Once   furosemide  20 mg Intravenous Daily   insulin aspart  0-6 Units Subcutaneous Q4H   lactulose  20 g Oral Daily   metoprolol tartrate  5 mg Intravenous Q8H   pantoprazole (PROTONIX) IV  40 mg Intravenous Q12H   potassium chloride  20 mEq Oral Once   prochlorperazine  10 mg Intravenous Q6H   rifaximin  550 mg Oral BID   rivaroxaban  20 mg Oral Q supper   sodium chloride flush  3 mL Intravenous Q12H   Continuous Infusions:  cefTRIAXone (ROCEPHIN)  IV 2 g (02/13/22 0840)     LOS: 5 days    Time spent: 35 minutes    Delmus Warwick Darleen Crocker, DO [facility]  If 7PM-7AM, please contact night-coverage [email or web address] 02/13/2022, 1:24 PM

## 2022-02-13 NOTE — Inpatient Diabetes Management (Signed)
Inpatient Diabetes Program Recommendations  AACE/ADA: New Consensus Statement on Inpatient Glycemic Control   Target Ranges:  Prepandial:   less than 140 mg/dL      Peak postprandial:   less than 180 mg/dL (1-2 hours)      Critically ill patients:  140 - 180 mg/dL    Latest Reference Range & Units 02/13/22 00:31 02/13/22 04:54 02/13/22 08:38  Glucose-Capillary 70 - 99 mg/dL 197 (H) 184 (H) 216 (H)    Latest Reference Range & Units 02/12/22 07:49 02/12/22 11:27 02/12/22 16:42 02/12/22 21:19  Glucose-Capillary 70 - 99 mg/dL 193 (H) 222 (H) 284 (H) 273 (H)   Review of Glycemic Control  Diabetes history: DM2 Outpatient Diabetes medications: Toujeo 40 units QHS, 70/30 30 units QAM, 70/30 20 units QPM Current orders for Inpatient glycemic control: Novolog 0-6 units Q4H  Inpatient Diabetes Program Recommendations:    Insulin: Please consider ordering Semglee 5 units Q24H.  NOTE: Noted TPN on hold for now as patient has started on clear liquid diet and appears to be tolerating.   Thanks, Barnie Alderman, RN, MSN, [place] Diabetes Coordinator Inpatient Diabetes Program [phone] (Team Pager from 8am to [place])

## 2022-02-13 NOTE — Progress Notes (Signed)
Gastroenterology Progress Note   Referring Provider: No ref. provider found Primary Care Physician:  Lindell Spar, MD Primary Gastroenterologist:  Dr.  Patient ID: Wanda Curry; [id number]; [birth year]   Subjective:    Tolerating full liquids. Asking for more solid diet now. No regurgitation, vomiting, abdominal pain. Had BM this morning. No melena, brbpr. She is scared to have future endoscopy given n/v, swallowing issues exacerbated by last one.   Objective:   Vital signs in last 24 hours: Temp:  [97.6 F (36.4 C)-98.5 F (36.9 C)] 98.5 F (36.9 C) (06/29 0451) Pulse Rate:  [89-96] 93 (06/29 0600) Resp:  [18-19] 19 (06/29 0451) BP: (114-137)/(51-65) 122/55 (06/29 0600) SpO2:  [96 %-98 %] 97 % (06/29 0451) Weight:  [100 kg] 100 kg (06/29 0600) Last BM Date : 02/07/22 General:   Alert,  Well-developed, well-nourished, pleasant and cooperative in NAD. Head:  Normocephalic and atraumatic. Eyes:  Sclera clear, no icterus.   Abdomen:  Soft, nontender and nondistended.  Normal bowel sounds, without guarding, and without rebound.   Extremities:  Without clubbing, deformity. 1-2+pitting edema in feet bilaterally. Neurologic:  Alert and  oriented x4;  grossly normal neurologically. Skin:  Intact without significant lesions or rashes. Psych:  Alert and cooperative. Normal mood and affect.  Intake/Output from previous day: 06/28 0701 - 06/29 0700 In: 3550.3 [P.O.:2276; I.V.:1172.2; IV Piggyback:102.1] Out: 3600 [Urine:3600] Intake/Output this shift: Total I/O In: 596 [P.O.:596] Out: 250 [Urine:250]  Lab Results: CBC Recent Labs    02/11/22 0309 02/12/22 0444 02/13/22 0434  WBC 5.7 4.3 4.9  HGB 7.6* 7.9* 8.1*  HCT 25.4* 25.6* 26.8*  MCV 93.4 92.1 94.0  PLT 115* 102* 98*   BMET Recent Labs    02/11/22 0309 02/12/22 0444 02/13/22 0434  NA 147* 144 141  K 3.3* 3.3* 3.6  CL 109 109 107  CO2 '29 29 30  '$ GLUCOSE 113* 187* 179*  BUN '21 19 16  '$ CREATININE 0.76  0.69 0.65  CALCIUM 8.8* 8.2* 8.1*   LFTs Recent Labs    02/12/22 0444 02/13/22 0434  BILITOT 1.4* 1.0  ALKPHOS 41 43  AST 20 21  ALT 17 18  PROT 4.9* 4.8*  ALBUMIN 2.7* 2.5*   No results for input(s): "LIPASE" in the last 72 hours. PT/INR No results for input(s): "LABPROT", "INR" in the last 72 hours.      Imaging Studies: [place] EKG SITE RITE  Result Date: 02/11/2022 If Site Rite image not attached, placement could not be confirmed due to current cardiac rhythm.  US Paracentesis  Result Date: 02/10/2022 INDICATION: Ascites, colon cancer EXAM: ULTRASOUND GUIDED DIAGNOSTIC AND THERAPEUTIC PARACENTESIS MEDICATIONS: None. COMPLICATIONS: None immediate. PROCEDURE: Informed written consent was obtained from the patient after a discussion of the risks, benefits and alternatives to treatment. A timeout was performed prior to the initiation of the procedure. Initial ultrasound scanning demonstrates a large amount of ascites within the right lower abdominal quadrant. The right lower abdomen was prepped and draped in the usual sterile fashion. 1% lidocaine was used for local anesthesia. Following this, a 10 cm length 5 [place] Yueh catheter was introduced. An ultrasound image was saved for documentation purposes. The paracentesis was performed. The catheter was removed and a dressing was applied. The patient tolerated the procedure well without immediate post procedural complication. FINDINGS: A total of approximately 2.6 L of clear yellow ascitic fluid was removed. Samples were sent to the laboratory as requested by the clinical team. IMPRESSION: Successful ultrasound-guided paracentesis yielding  2.6 liters of peritoneal fluid. Electronically Signed   By: Lavonia Dana M.D.   On: 02/10/2022 14:36   DG Chest 1 View  Result Date: 02/10/2022 CLINICAL DATA:  Aspiration EXAM: CHEST  1 VIEW COMPARISON:  02/09/2022 FINDINGS: Retention of contrast within the thoracic esophagus from preceding esophagram. No  contrast seen within stomach, cannot exclude esophageal obstruction or achalasia. Normal heart size and mediastinal contours. Atherosclerotic calcification aorta. Patchy BILATERAL pulmonary infiltrates again seen. Aspirated contrast identified within the LEFT mainstem bronchus extending to the central airways in the perihilar region. No pleural effusion or pneumothorax. IMPRESSION: Aspirated contrast material within the central airways of the LEFT lung. Persistent patchy BILATERAL pulmonary infiltrates. Prolonged thoracic esophageal retention of contrast material, esophagus appearing mildly dilated, without contrast seen within the stomach, question achalasia versus distal esophageal obstruction. Electronically Signed   By: Lavonia Dana M.D.   On: 02/10/2022 10:56   DG ESOPHAGUS W SINGLE CM (SOL OR THIN BA)  Result Date: 02/10/2022 CLINICAL DATA:  Dysphagia, concern for aspiration, history liver cancer, nausea and vomiting since endoscopy EXAM: ESOPHOGRAM/BARIUM SWALLOW TECHNIQUE: Single contrast examination was performed using  thin barium. FLUOROSCOPY: Radiation Exposure Index (as provided by the fluoroscopic device): 30.4 mGy Kerma COMPARISON:  None Available. FINDINGS: Marked esophageal dysmotility, with poor primary and secondary peristaltic waves. Prolonged retention of contrast within a mildly dilated thoracic esophagus. After 3 swallows of barium, patient became nauseated and vomited a portion of the contrast. A portion of the contrast was also aspirated into the trachea, LEFT mainstem bronchus and extending towards LEFT lower lobe, observed under fluoroscopy though these images were not saved. Procedure terminated. Oxygen saturation remained 95-97% following suctioning. Extensive atherosclerotic calcification of aortic arch. IMPRESSION: Marked esophageal dysmotility with a mildly dilated thoracic esophagus which demonstrates prolonged retention of contrast. Aspiration of contrast into trachea, LEFT  mainstem bronchus and proximal LEFT lower lobe bronchus. Electronically Signed   By: Lavonia Dana M.D.   On: 02/10/2022 10:54   DG CHEST PORT 1 VIEW  Result Date: 02/09/2022 CLINICAL DATA:  Shortness of breath.  History of diabetes and cancer EXAM: PORTABLE CHEST 1 VIEW COMPARISON:  01/08/2022 FINDINGS: Generous heart size accentuated by rotation. Aortic atherosclerosis. Left subclavian porta catheter with tip near the SVC origin. Patchy pulmonary opacity in the lower lungs. Lung volumes are low. No visible effusion or pneumothorax. IMPRESSION: Patchy opacity at the bases primarily concerning for multifocal pneumonia. Electronically Signed   By: Jorje Guild M.D.   On: 02/09/2022 08:31   CT ABDOMEN PELVIS W CONTRAST  Result Date: 02/08/2022 CLINICAL DATA:  Nausea and vomiting for 2 days. Recent endoscopy. Hepatocellular carcinoma and colon carcinoma. Prior Y 90. * Tracking Code: BO * EXAM: CT ABDOMEN AND PELVIS WITH CONTRAST TECHNIQUE: Multidetector CT imaging of the abdomen and pelvis was performed using the standard protocol following bolus administration of intravenous contrast. RADIATION DOSE REDUCTION: This exam was performed according to the departmental dose-optimization program which includes automated exposure control, adjustment of the mA and/or kV according to patient size and/or use of iterative reconstruction technique. CONTRAST:  1110m OMNIPAQUE IOHEXOL 300 MG/ML  SOLN COMPARISON:  MRI on 12/12/2021, and CT on 10/15/2021 FINDINGS: Lower Chest: No acute findings. Hepatobiliary: Hepatic cirrhosis again demonstrated. Central low-attenuation lesion measures 2.8 x 2.5 cm, without significant change since previous study. A 9 mm lesion in the caudate is stable. A 1 cm low-attenuation lesion in segment 4A and a nearly isointense lesion in segment 3 are stable when compared to  diffusion imaging on prior MRI. No definite new or enlarging liver lesions identified. Prior cholecystectomy. No evidence of  biliary obstruction. Pancreas:  No mass or inflammatory changes. Spleen: Within normal limits in size and appearance. Adrenals/Urinary Tract: No masses identified. No evidence of ureteral calculi or hydronephrosis. Stomach/Bowel: Small hiatal hernia again noted. Postop changes again seen from previous right colectomy. No evidence of obstruction, inflammatory process or abscess. Diverticulosis is seen mainly involving the sigmoid colon, however there is no evidence of diverticulitis. Vascular/Lymphatic: No pathologically enlarged lymph nodes. No acute vascular findings. Aortic atherosclerotic calcification incidentally noted. Reproductive: Prior hysterectomy noted. Adnexal regions are unremarkable in appearance. Other: Moderate to large amount of ascites is significantly increased since prior study. Musculoskeletal:  No suspicious bone lesions identified. IMPRESSION: Hepatic cirrhosis. No significant change in several hepatic lesions compared to prior MRI. Increased moderate to large amount of ascites. Stable small hiatal hernia. Colonic diverticulosis, without radiographic evidence of diverticulitis. Aortic Atherosclerosis (ICD10-I70.0). Electronically Signed   By: Marlaine Hind M.D.   On: 02/08/2022 18:18   DG Abd Acute W/Chest  Result Date: 02/07/2022 CLINICAL DATA:  vomiting s/p endoscopy EXAM: DG ABDOMEN ACUTE WITH 1 VIEW CHEST COMPARISON:  CT 10/15/2021 FINDINGS: Chest port catheter tip overlies the proximal superior vena cava. Unchanged cardiomediastinal silhouette. No focal airspace disease. Chronic interstitial lung changes. No large pleural effusion. No pneumothorax. There is no acute osseous abnormality. Degenerative changes of the spine. Paucity of bowel gas. No evidence of free intraperitoneal gas. There is a vascular coil in the right upper quadrant. IMPRESSION: Paucity of bowel gas.  No evidence of free intraperitoneal gas. No acute cardiopulmonary disease. Electronically Signed   By: Maurine Simmering  M.D.   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 Delton Coombes, stage IIa colon cancer status post right hemicolectomy in 2018, IDA, esophageal varices status post banding on February 06, 2022 presenting to ED evening of her EGD with persistent regurgitation/vomiting.   Nausea/vomiting/regurgitation: Upper GI series this admission showed severe esophageal dysmotility, mild dilated thoracic esophagus and prolonged retention of contrast, aspiration at time of procedure into trachea, left mainstem bronchus, and left lower lobus bronchus. Subsequent CXR with aspirated contrast material, persistent patchy bilateral pulmonary infiltrates.  Likely exacerbated with recent variceal banding.  Did not improve with Reglan or Compazine.  Not felt to be candidate for PEG tube placement due to risk of rupturing esophageal varices when feeding tube is passed and surgically placed G-tube would best be done at [facility] due to her comorbidities. There was plans to start TPN yesterday however the patient started tolerating ice chips and medications without issues.  She was transition to a liquid diet to see if she tolerates.  Cirrhosis: MELD sodium of 13.  Underwent paracentesis on Monday with 2.6 L removed, negative for SBP.  IV Lasix started 2 days ago for peripheral edema.  Also started on Xifaxan for concerns for HE. She is not following GI for her cirrhosis. She has been followed by IR and oncology for her [facility] for several years.   Lower extremity edema: mostly pedal edema. On iv lasix. Parenteral fluids have been held.   Acute on chronic anemia: In the setting of immunotherapy, Atezolizumab and Bevacizumab. Received one unit of prbcs this admission. Today her Hgb is 8.1. Ferritin 42, iron 23, TIBC 243, iron sats 9%.  B12 and folate normal.  Received iron infusion June 23. Last colonoscopy 2018  with pancolonic diverticulosis.  No overt GI bleeding.    Plan:   Trial of soft foods today.  Would recommend outpatient follow for anemia, cirrhosis. Can discuss future endoscopy at that time. She is very apprehensive at this point.  Add lactulose 30cc daily.    LOS: 5 days   Laureen Ochs. Bernarda Caffey [facility] [phone] 6/29/20238:44 AM

## 2022-02-13 NOTE — Telephone Encounter (Signed)
Please arrange for hospital follow up in 2 weeks with [facility] if possible to discuss future EGDs (patient apprehensive), anemia, cirrhosis care.

## 2022-02-14 ENCOUNTER — Ambulatory Visit: Payer: Medicare Other | Admitting: Internal Medicine

## 2022-02-14 DIAGNOSIS — R112 Nausea with vomiting, unspecified: Secondary | ICD-10-CM | POA: Diagnosis not present

## 2022-02-14 LAB — CBC
HCT: 27.5 % — ABNORMAL LOW (ref 36.0–46.0)
Hemoglobin: 8.5 g/dL — ABNORMAL LOW (ref 12.0–15.0)
MCH: 28.6 pg (ref 26.0–34.0)
MCHC: 30.9 g/dL (ref 30.0–36.0)
MCV: 92.6 fL (ref 80.0–100.0)
Platelets: 98 10*3/uL — ABNORMAL LOW (ref 150–400)
RBC: 2.97 MIL/uL — ABNORMAL LOW (ref 3.87–5.11)
RDW: 17.9 % — ABNORMAL HIGH (ref 11.5–15.5)
WBC: 6 10*3/uL (ref 4.0–10.5)
nRBC: 0 % (ref 0.0–0.2)

## 2022-02-14 LAB — BASIC METABOLIC PANEL
Anion gap: 4 — ABNORMAL LOW (ref 5–15)
BUN: 14 mg/dL (ref 8–23)
CO2: 30 mmol/L (ref 22–32)
Calcium: 8.2 mg/dL — ABNORMAL LOW (ref 8.9–10.3)
Chloride: 105 mmol/L (ref 98–111)
Creatinine, Ser: 0.69 mg/dL (ref 0.44–1.00)
GFR, Estimated: >60 mL/min (ref >60)
Glucose, Bld: 191 mg/dL — ABNORMAL HIGH (ref 70–99)
Potassium: 3.4 mmol/L — ABNORMAL LOW (ref 3.5–5.1)
Sodium: 139 mmol/L (ref 135–145)

## 2022-02-14 LAB — CULTURE, BLOOD (ROUTINE X 2)
Culture: NO GROWTH
Culture: NO GROWTH

## 2022-02-14 LAB — GLUCOSE, CAPILLARY
Glucose-Capillary: 181 mg/dL — ABNORMAL HIGH (ref 70–99)
Glucose-Capillary: 209 mg/dL — ABNORMAL HIGH (ref 70–99)
Glucose-Capillary: 215 mg/dL — ABNORMAL HIGH (ref 70–99)
Glucose-Capillary: 269 mg/dL — ABNORMAL HIGH (ref 70–99)

## 2022-02-14 LAB — AMMONIA: Ammonia: 27 umol/L (ref 9–35)

## 2022-02-14 MED ORDER — RIFAXIMIN 550 MG PO TABS: 550.0000 mg | ORAL_TABLET | Freq: Two times a day (BID) | ORAL | 0 refills | Status: DC

## 2022-02-14 MED ORDER — POTASSIUM CHLORIDE CRYS ER 20 MEQ PO TBCR
40.0000 meq | EXTENDED_RELEASE_TABLET | Freq: Once | ORAL | Status: AC
  Administered 2022-02-14: 40 meq via ORAL
  Filled 2022-02-14: qty 2

## 2022-02-14 MED ORDER — LACTULOSE 10 GM/15ML PO SOLN: 20.0000 g | Freq: Every day | ORAL | 0 refills | Status: DC

## 2022-02-14 MED ORDER — HEPARIN SOD (PORK) LOCK FLUSH 100 UNIT/ML IV SOLN
500.0000 [IU] | Freq: Once | INTRAVENOUS | Status: AC
  Administered 2022-02-14: 500 [IU] via INTRAVENOUS
  Filled 2022-02-14: qty 5

## 2022-02-14 NOTE — Progress Notes (Signed)
Patient discharged home today, transported home by family. Discharge summary went over with patient and daughter, both verbalized understanding. Belongings sent home with patient.

## 2022-02-14 NOTE — TOC Transition Note (Signed)
Transition of Care [facility]) - CM/SW Discharge Note   Patient Details  Name: Wanda Curry MRN: [id number] Date of Birth: 1945-12-02  Transition of Care [facility]) CM/SW Contact:  Boneta Lucks, RN Phone Number: 02/14/2022, 12:29 PM   Clinical Narrative:   Patient discharging home. Patient improving and now tolerating full liquids. MD is ordered a hospital bed. Patient lives with her daughter and her family. Referral sent to [facility] with [employer]. It will be delivered today.  Patient is agreeable to home health. Georgina Snell with [facility] accepted the referral for HHRN/PT   Final next level of care: [place] Barriers to Discharge: Barriers Resolved   Patient Goals and CMS Choice Patient states their goals for this hospitalization and ongoing recovery are:: to go home. CMS Medicare.gov Compare Post Acute Care list provided to:: Patient Choice offered to / list presented to : Patient  Discharge Placement               Patient to be transferred to facility by: Daughter Name of family member notified: [name] Patient and family notified of of transfer: 02/14/22  Discharge Plan and Services                DME Arranged: Hospital bed DME Agency: [employer] Date DME Agency Contacted: 02/14/22 Time DME Agency Contacted: [phone]   [place]: PT, RN [place] Agency: [place] Date [facility] Agency Contacted: 02/14/22 Time [place]: 1143 Representative spoke with at [place]: Georgina Snell   Readmission Risk Interventions    02/14/2022   12:25 PM  Readmission Risk Prevention Plan  Transportation Screening Complete  PCP or Specialist Appt within 3-5 Days Complete  HRI or [place] Complete  Social Work Consult for [place] Planning/Counseling Complete  Palliative Care Screening Complete  Medication Review [employer]) Complete

## 2022-02-14 NOTE — Discharge Summary (Signed)
Physician Discharge Summary  Wanda Curry ZOX:[id number] DOB: 01-Feb-1946 DOA: 02/07/2022  PCP: Lindell Spar, MD  Admit date: 02/07/2022  Discharge date: 02/14/2022  Admitted From:Home  Disposition:  Home  Recommendations for Outpatient Follow-up:  Follow up with PCP in 1-2 weeks Follow-up with gastroenterology in 2 weeks to discuss future care including need for endoscopy Continue rifaximin and lactulose as prescribed Continue other home medications as noted below  Home Health: None  Equipment/Devices: Hospital bed  Discharge Condition:Stable  CODE STATUS: Full  Diet recommendation: Full liquid  Brief/Interim Summary: Wanda Curry is a 75 year old female with past history of colon cancer, liver cancer, DM 2, HTN, CKD presenting with intractable nausea vomiting since the day prior to admission.   Patient had an endoscopy by Dr. Gala Romney yesterday 02/06/2022 for evaluation of possible varices.  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  Patient was subsequently discharged home.  Since procedure she has not been able to keep solids or liquids down.  She was followed by gastroenterology during the course of her stay and had undergone barium swallow evaluation and findings were significant for esophageal dysmotility as well as possible lower esophageal stricture.  She was concurrently noted to have some acute hypoxemic respiratory failure with concerns for community-acquired pneumonia along with UTI and sepsis was ruled out.  She has completed course of treatment with IV Rocephin.  She was hospitalized for several days as she would not tolerate diet and required IV fluid.  It was thought that she would require TPN initiation, but this was avoided as she started to tolerate clear liquid diet.  This was advanced to full liquid and further advancement to soft diet could not be tolerated as this produced some nausea and vomiting.  She was also  noted to have findings of cirrhosis and underwent paracentesis with 2.6 L of fluid removed.  She was started on Xifaxan as well as lactulose during the stay due to concerns for hepatic encephalopathy.  She is currently tolerating full liquid diet and from a GI standpoint is stable for discharge with close follow-up noted in the next 2 weeks.  She will be provided a hospital bed as requested.  Discharge Diagnoses:  Principal Problem:   Intractable nausea and vomiting Active Problems:   Normocytic anemia   UTI (urinary tract infection)   Acute respiratory failure with hypoxia (HCC)   SIRS (systemic inflammatory response syndrome) (HCC)   Dysphagia   HTN (hypertension)   Colon cancer (HCC)   Malignant neoplasm of colon (HCC)   History of pulmonary embolism   Type 2 diabetes mellitus (HCC)   RLS (restless legs syndrome)   Hepatocellular carcinoma (HCC)   Chronic diarrhea   RA (rheumatoid arthritis) (HCC)   CKD (chronic kidney disease), stage III (HCC)   Gastroesophageal reflux disease   History of DVT (deep vein thrombosis)   Anemia   Alcoholic cirrhosis of liver with ascites (HCC)   Cirrhosis of liver with ascites ([place])  Principal discharge diagnosis: Intractable nausea and vomiting with regurgitation likely secondary to severe esophageal dysmotility.  Cirrhosis with mild hepatic encephalopathy.  UTI and community-acquired pneumonia.  Discharge Instructions  Discharge Instructions     Diet - low sodium heart healthy   Complete by: As directed    Increase activity slowly   Complete by: As directed    No wound care   Complete by: As directed       Allergies as of  02/14/2022       Reactions   Xeloda [capecitabine] Other (See Comments)   Blisters and pain to skin to include feet and arms   Adhesive [tape]    Adhesive tape and band-aids cause skin irritation   Neosporin Original [bacitracin-neomycin-polymyxin] Other (See Comments)   blister   Sulfa Antibiotics Nausea Only,  Other (See Comments)   Joint paint   Erythromycin Itching, Rash   burning        Medication List     TAKE these medications    acetaminophen 500 MG tablet Commonly known as: TYLENOL Take 500 mg by mouth every 6 (six) hours as needed for moderate pain.   AVASTIN IV Inject into the vein every 21 ( twenty-one) days.   carvedilol 6.25 MG tablet Commonly known as: COREG Take 6.25 mg by mouth 2 (two) times daily with a meal.   cholecalciferol 25 MCG (1000 UNIT) tablet Commonly known as: VITAMIN D3 Take 1,000 Units by mouth daily.   folic acid 1 MG tablet Commonly known as: FOLVITE Take 1 mg by mouth daily.   furosemide 20 MG tablet Commonly known as: LASIX Take 20 mg by mouth daily as needed for edema.   gabapentin 600 MG tablet Commonly known as: NEURONTIN Take 600 mg by mouth 3 (three) times daily as needed (pain).   glucosamine-chondroitin 500-400 MG tablet Take 2 tablets by mouth daily.   Gvoke HypoPen 2-Pack 1 MG/0.2ML Soaj Generic drug: Glucagon Inject 0.2 mLs into the skin as needed (Blood glucose less than 55).   lactulose 10 GM/15ML solution Commonly known as: CHRONULAC Take 30 mLs (20 g total) by mouth daily. Start taking on: February 15, 2022   lidocaine-prilocaine cream Commonly known as: EMLA Apply to affected area once   loperamide 2 MG capsule Commonly known as: IMODIUM Take 2 mg by mouth as needed for diarrhea or loose stools.   magnesium oxide 400 (240 Mg) MG tablet Commonly known as: MAG-OX Take 1 tablet (400 mg total) by mouth 3 (three) times daily.   methotrexate 2.5 MG tablet Commonly known as: RHEUMATREX Take 20 mg by mouth every Sunday. Caution:Chemotherapy. Protect from light.   NovoLOG Mix 70/30 FlexPen (70-30) 100 UNIT/ML FlexPen Generic drug: insulin aspart protamine - aspart Inject 20-30 Units into the skin See admin instructions. Inject 30 units into the skin in the morning and inject 20 units in the evening   omeprazole 40 MG  capsule Commonly known as: PRILOSEC Take 40 mg by mouth daily.   prochlorperazine 10 MG tablet Commonly known as: COMPAZINE Take 1 tablet (10 mg total) by mouth every 6 (six) hours as needed for nausea or vomiting.   rifaximin 550 MG Tabs tablet Commonly known as: XIFAXAN Take 1 tablet (550 mg total) by mouth 2 (two) times daily.   rivaroxaban 20 MG Tabs tablet Commonly known as: XARELTO Take 1 tablet (20 mg total) by mouth daily with supper.   rOPINIRole 1 MG tablet Commonly known as: REQUIP Take 1 mg by mouth 3 (three) times daily as needed (restless leg).   TECENTRIQ IV Inject into the vein every 21 ( twenty-one) days.   traMADol 50 MG tablet Commonly known as: Ultram Take 1 tablet (50 mg total) by mouth every 6 (six) hours as needed.   Tyler Aas FlexTouch 100 UNIT/ML FlexTouch Pen Generic drug: insulin degludec Inject 40 Units into the skin at bedtime.               Durable Medical Equipment  (  From admission, onward)           Start     Ordered   02/14/22 0912  For home use only DME Hospital bed  Once       Question Answer Comment  Length of Need Lifetime   The above medical condition requires: Patient requires the ability to reposition frequently   Head must be elevated greater than: 30 degrees   Bed type Semi-electric      02/14/22 0911            Follow-up Information     Lindell Spar, MD. Schedule an appointment as soon as possible for a visit in 1 week(s).   Specialty: Internal Medicine Contact information: [address] [place] [address] [phone]         [place]. Go in 2 week(s).   Contact information: [place] [address] [phone]               Allergies  Allergen Reactions   Xeloda [Capecitabine] Other (See Comments)    Blisters and pain to skin to include feet and arms   Adhesive [Tape]     Adhesive tape and band-aids cause skin  irritation   Neosporin Original [Bacitracin-Neomycin-Polymyxin] Other (See Comments)    blister   Sulfa Antibiotics Nausea Only and Other (See Comments)    Joint paint   Erythromycin Itching and Rash    burning    Consultations: GI   Procedures/Studies: [place] EKG SITE RITE  Result Date: 02/11/2022 If Site Rite image not attached, placement could not be confirmed due to current cardiac rhythm.  US Paracentesis  Result Date: 02/10/2022 INDICATION: Ascites, colon cancer EXAM: ULTRASOUND GUIDED DIAGNOSTIC AND THERAPEUTIC PARACENTESIS MEDICATIONS: None. COMPLICATIONS: None immediate. PROCEDURE: Informed written consent was obtained from the patient after a discussion of the risks, benefits and alternatives to treatment. A timeout was performed prior to the initiation of the procedure. Initial ultrasound scanning demonstrates a large amount of ascites within the right lower abdominal quadrant. The right lower abdomen was prepped and draped in the usual sterile fashion. 1% lidocaine was used for local anesthesia. Following this, a 10 cm length 5 [place] Yueh catheter was introduced. An ultrasound image was saved for documentation purposes. The paracentesis was performed. The catheter was removed and a dressing was applied. The patient tolerated the procedure well without immediate post procedural complication. FINDINGS: A total of approximately 2.6 L of clear yellow ascitic fluid was removed. Samples were sent to the laboratory as requested by the clinical team. IMPRESSION: Successful ultrasound-guided paracentesis yielding 2.6 liters of peritoneal fluid. Electronically Signed   By: Lavonia Dana M.D.   On: 02/10/2022 14:36   DG Chest 1 View  Result Date: 02/10/2022 CLINICAL DATA:  Aspiration EXAM: CHEST  1 VIEW COMPARISON:  02/09/2022 FINDINGS: Retention of contrast within the thoracic esophagus from preceding esophagram. No contrast seen within stomach, cannot exclude esophageal obstruction or  achalasia. Normal heart size and mediastinal contours. Atherosclerotic calcification aorta. Patchy BILATERAL pulmonary infiltrates again seen. Aspirated contrast identified within the LEFT mainstem bronchus extending to the central airways in the perihilar region. No pleural effusion or pneumothorax. IMPRESSION: Aspirated contrast material within the central airways of the LEFT lung. Persistent patchy BILATERAL pulmonary infiltrates. Prolonged thoracic esophageal retention of contrast material, esophagus appearing mildly dilated, without contrast seen within the stomach, question achalasia versus distal esophageal obstruction. Electronically Signed   By: Lavonia Dana M.D.   On: 02/10/2022  10:56   DG ESOPHAGUS W SINGLE CM (SOL OR THIN BA)  Result Date: 02/10/2022 CLINICAL DATA:  Dysphagia, concern for aspiration, history liver cancer, nausea and vomiting since endoscopy EXAM: ESOPHOGRAM/BARIUM SWALLOW TECHNIQUE: Single contrast examination was performed using  thin barium. FLUOROSCOPY: Radiation Exposure Index (as provided by the fluoroscopic device): 30.4 mGy Kerma COMPARISON:  None Available. FINDINGS: Marked esophageal dysmotility, with poor primary and secondary peristaltic waves. Prolonged retention of contrast within a mildly dilated thoracic esophagus. After 3 swallows of barium, patient became nauseated and vomited a portion of the contrast. A portion of the contrast was also aspirated into the trachea, LEFT mainstem bronchus and extending towards LEFT lower lobe, observed under fluoroscopy though these images were not saved. Procedure terminated. Oxygen saturation remained 95-97% following suctioning. Extensive atherosclerotic calcification of aortic arch. IMPRESSION: Marked esophageal dysmotility with a mildly dilated thoracic esophagus which demonstrates prolonged retention of contrast. Aspiration of contrast into trachea, LEFT mainstem bronchus and proximal LEFT lower lobe bronchus. Electronically  Signed   By: Lavonia Dana M.D.   On: 02/10/2022 10:54   DG CHEST PORT 1 VIEW  Result Date: 02/09/2022 CLINICAL DATA:  Shortness of breath.  History of diabetes and cancer EXAM: PORTABLE CHEST 1 VIEW COMPARISON:  01/08/2022 FINDINGS: Generous heart size accentuated by rotation. Aortic atherosclerosis. Left subclavian porta catheter with tip near the SVC origin. Patchy pulmonary opacity in the lower lungs. Lung volumes are low. No visible effusion or pneumothorax. IMPRESSION: Patchy opacity at the bases primarily concerning for multifocal pneumonia. Electronically Signed   By: Jorje Guild M.D.   On: 02/09/2022 08:31   CT ABDOMEN PELVIS W CONTRAST  Result Date: 02/08/2022 CLINICAL DATA:  Nausea and vomiting for 2 days. Recent endoscopy. Hepatocellular carcinoma and colon carcinoma. Prior Y 90. * Tracking Code: BO * EXAM: CT ABDOMEN AND PELVIS WITH CONTRAST TECHNIQUE: Multidetector CT imaging of the abdomen and pelvis was performed using the standard protocol following bolus administration of intravenous contrast. RADIATION DOSE REDUCTION: This exam was performed according to the departmental dose-optimization program which includes automated exposure control, adjustment of the mA and/or kV according to patient size and/or use of iterative reconstruction technique. CONTRAST:  148m OMNIPAQUE IOHEXOL 300 MG/ML  SOLN COMPARISON:  MRI on 12/12/2021, and CT on 10/15/2021 FINDINGS: Lower Chest: No acute findings. Hepatobiliary: Hepatic cirrhosis again demonstrated. Central low-attenuation lesion measures 2.8 x 2.5 cm, without significant change since previous study. A 9 mm lesion in the caudate is stable. A 1 cm low-attenuation lesion in segment 4A and a nearly isointense lesion in segment 3 are stable when compared to diffusion imaging on prior MRI. No definite new or enlarging liver lesions identified. Prior cholecystectomy. No evidence of biliary obstruction. Pancreas:  No mass or inflammatory changes. Spleen:  Within normal limits in size and appearance. Adrenals/Urinary Tract: No masses identified. No evidence of ureteral calculi or hydronephrosis. Stomach/Bowel: Small hiatal hernia again noted. Postop changes again seen from previous right colectomy. No evidence of obstruction, inflammatory process or abscess. Diverticulosis is seen mainly involving the sigmoid colon, however there is no evidence of diverticulitis. Vascular/Lymphatic: No pathologically enlarged lymph nodes. No acute vascular findings. Aortic atherosclerotic calcification incidentally noted. Reproductive: Prior hysterectomy noted. Adnexal regions are unremarkable in appearance. Other: Moderate to large amount of ascites is significantly increased since prior study. Musculoskeletal:  No suspicious bone lesions identified. IMPRESSION: Hepatic cirrhosis. No significant change in several hepatic lesions compared to prior MRI. Increased moderate to large amount of ascites. Stable small hiatal  hernia. Colonic diverticulosis, without radiographic evidence of diverticulitis. Aortic Atherosclerosis (ICD10-I70.0). Electronically Signed   By: Marlaine Hind M.D.   On: 02/08/2022 18:18   DG Abd Acute W/Chest  Result Date: 02/07/2022 CLINICAL DATA:  vomiting s/p endoscopy EXAM: DG ABDOMEN ACUTE WITH 1 VIEW CHEST COMPARISON:  CT 10/15/2021 FINDINGS: Chest port catheter tip overlies the proximal superior vena cava. Unchanged cardiomediastinal silhouette. No focal airspace disease. Chronic interstitial lung changes. No large pleural effusion. No pneumothorax. There is no acute osseous abnormality. Degenerative changes of the spine. Paucity of bowel gas. No evidence of free intraperitoneal gas. There is a vascular coil in the right upper quadrant. IMPRESSION: Paucity of bowel gas.  No evidence of free intraperitoneal gas. No acute cardiopulmonary disease. Electronically Signed   By: Maurine Simmering M.D.   On: 02/07/2022 12:59     Discharge Exam: Vitals:   02/13/22  2141 02/14/22 0515  BP: 133/63 (!) 134/58  Pulse: (!) 105 99  Resp: 20 20  Temp: 99.5 F (37.5 C) 98.7 F (37.1 C)  SpO2: 92% 90%   Vitals:   02/13/22 0600 02/13/22 1246 02/13/22 2141 02/14/22 0515  BP: (!) 122/55 (!) 124/50 133/63 (!) 134/58  Pulse: 93 (!) 107 (!) 105 99  Resp:  '18 20 20  '$ Temp:  98.6 F (37 C) 99.5 F (37.5 C) 98.7 F (37.1 C)  TempSrc:  Oral Oral   SpO2:  90% 92% 90%  Weight: 100 kg   97.2 kg  Height:        General: Pt is alert, awake, not in acute distress Cardiovascular: RRR, S1/S2 +, no rubs, no gallops Respiratory: CTA bilaterally, no wheezing, no rhonchi Abdominal: Soft, NT, ND, bowel sounds + Extremities: no edema, no cyanosis    The results of significant diagnostics from this hospitalization (including imaging, microbiology, ancillary and laboratory) are listed below for reference.     Microbiology: Recent Results (from the past 240 hour(s))  Urine Culture     Status: Abnormal   Collection Time: 02/09/22  8:07 AM   Specimen: Urine, Clean Catch  Result Value Ref Range Status   Specimen Description   Final    URINE, CLEAN CATCH Performed at [facility], [address] [place], [place] [address]    Special Requests   Final    NONE Performed at [facility], [address], [place], [place] [address]    Culture >=100,000 COLONIES/mL KLEBSIELLA PNEUMONIAE (A)  Final   Report Status 02/11/2022 FINAL  Final   Organism ID, Bacteria KLEBSIELLA PNEUMONIAE (A)  Final      Susceptibility   Klebsiella pneumoniae - MIC*    AMPICILLIN RESISTANT Resistant     CEFAZOLIN <=4 SENSITIVE Sensitive     CEFEPIME <=0.12 SENSITIVE Sensitive     CEFTRIAXONE <=0.25 SENSITIVE Sensitive     CIPROFLOXACIN <=0.25 SENSITIVE Sensitive     GENTAMICIN <=1 SENSITIVE Sensitive     IMIPENEM <=0.25 SENSITIVE Sensitive     NITROFURANTOIN 128 RESISTANT Resistant     TRIMETH/SULFA <=20 SENSITIVE Sensitive     AMPICILLIN/SULBACTAM 4 SENSITIVE Sensitive     PIP/TAZO  <=4 SENSITIVE Sensitive     * >=100,000 COLONIES/mL KLEBSIELLA PNEUMONIAE  Culture, blood (Routine X 2) w Reflex to ID Panel     Status: None   Collection Time: 02/09/22  8:09 AM   Specimen: BLOOD RIGHT HAND  Result Value Ref Range Status   Specimen Description   Final    BLOOD RIGHT HAND BOTTLES DRAWN AEROBIC  AND ANAEROBIC   Special Requests   Final    Blood Culture results may not be optimal due to an excessive volume of blood received in culture bottles   Culture   Final    NO GROWTH 5 DAYS Performed at [facility], [address], [place], [place] [address]    Report Status 02/14/2022 FINAL  Final  Culture, blood (Routine X 2) w Reflex to ID Panel     Status: None   Collection Time: 02/09/22  8:11 AM   Specimen: BLOOD LEFT HAND  Result Value Ref Range Status   Specimen Description   Final    BLOOD LEFT HAND BOTTLES DRAWN AEROBIC AND ANAEROBIC   Special Requests   Final    Blood Culture results may not be optimal due to an excessive volume of blood received in culture bottles   Culture   Final    NO GROWTH 5 DAYS Performed at [facility], [address], [place], [place] [address]    Report Status 02/14/2022 FINAL  Final  MRSA Next Gen by PCR, Nasal     Status: None   Collection Time: 02/09/22  1:15 PM   Specimen: Nasal Mucosa; Nasal Swab  Result Value Ref Range Status   MRSA by PCR Next Gen NOT DETECTED NOT DETECTED Final    Comment: (NOTE) The GeneXpert MRSA Assay (FDA approved for NASAL specimens only), is one component of a comprehensive MRSA colonization surveillance program. It is not intended to diagnose MRSA infection nor to guide or monitor treatment for MRSA infections. Test performance is not FDA approved in patients less than 29 years old. Performed at [facility], [address] [place], [place] [address]   Culture, body fluid w Gram Stain-bottle     Status: None (Preliminary result)   Collection Time: 02/10/22  1:30 PM   Specimen: Peritoneal Washings   Result Value Ref Range Status   Specimen Description PERITONEAL  Final   Special Requests   Final    BOTTLES DRAWN AEROBIC AND ANAEROBIC Blood Culture adequate volume   Culture   Final    NO GROWTH 4 DAYS Performed at [facility], [address] [place], [place] [address]    Report Status PENDING  Incomplete  Gram stain     Status: None   Collection Time: 02/10/22  1:30 PM   Specimen: Peritoneal Washings  Result Value Ref Range Status   Specimen Description PERITONEAL  Final   Special Requests NONE  Final   Gram Stain   Final    WBC PRESENT, PREDOMINANTLY PMN NO ORGANISMS SEEN CYTOSPIN SMEAR Performed at [facility], [address], [place], [place] [address]    Report Status 02/10/2022 FINAL  Final     Labs: BNP (last 3 results) Recent Labs    02/09/22 0809  BNP 629.4*   Basic Metabolic Panel: Recent Labs  Lab 02/09/22 0431 02/09/22 0813 02/10/22 0433 02/11/22 0309 02/12/22 0444 02/13/22 0434 02/14/22 0413  NA  --    < > 145 147* 144 141 139  K  --    < > 2.9* 3.3* 3.3* 3.6 3.4*  CL  --    < > 108 109 109 107 105  CO2  --    < > '29 29 29 30 30  '$ GLUCOSE  --    < > 137* 113* 187* 179* 191*  BUN  --    < > '19 21 19 16 14  '$ CREATININE  --    < >  0.88 0.76 0.69 0.65 0.69  CALCIUM  --    < > 8.7* 8.8* 8.2* 8.1* 8.2*  MG 1.3*  --  1.9  --  1.6*  --   --   PHOS  --   --   --   --  2.3*  --   --    < > = values in this interval not displayed.   Liver Function Tests: Recent Labs  Lab 02/07/22 1207 02/09/22 0813 02/10/22 0433 02/12/22 0444 02/13/22 0434  AST 33 '25 20 20 21  '$ ALT '23 24 20 17 18  '$ ALKPHOS 69 64 45 41 43  BILITOT 1.3* 2.4* 1.4* 1.4* 1.0  PROT 5.8* 5.8* 6.2* 4.9* 4.8*  ALBUMIN 2.3* 2.4* 3.5 2.7* 2.5*   Recent Labs  Lab 02/07/22 1207  LIPASE 26   Recent Labs  Lab 02/13/22 0434 02/14/22 0413  AMMONIA 36* 27   CBC: Recent Labs  Lab 02/07/22 1207 02/08/22 0409 02/10/22 0433 02/11/22 0309 02/12/22 0444 02/13/22 0434 02/14/22 0413   WBC 5.1   < > 6.9 5.7 4.3 4.9 6.0  NEUTROABS 3.9  --   --   --   --   --   --   HGB 7.6*   < > 7.4* 7.6* 7.9* 8.1* 8.5*  HCT 24.3*   < > 24.0* 25.4* 25.6* 26.8* 27.5*  MCV 90.3   < > 92.0 93.4 92.1 94.0 92.6  PLT 136*   < > 122* 115* 102* 98* 98*   < > = values in this interval not displayed.   Cardiac Enzymes: No results for input(s): "CKTOTAL", "CKMB", "CKMBINDEX", "TROPONINI" in the last 168 hours. BNP: Invalid input(s): "POCBNP" CBG: Recent Labs  Lab 02/13/22 2007 02/14/22 0017 02/14/22 0401 02/14/22 0729 02/14/22 1116  GLUCAP 242* 209* 181* 215* 269*   D-Dimer No results for input(s): "DDIMER" in the last 72 hours. Hgb A1c No results for input(s): "HGBA1C" in the last 72 hours. Lipid Profile Recent Labs    02/12/22 0444  TRIG 61   Thyroid function studies No results for input(s): "TSH", "T4TOTAL", "T3FREE", "THYROIDAB" in the last 72 hours.  Invalid input(s): "FREET3" Anemia work up No results for input(s): "VITAMINB12", "FOLATE", "FERRITIN", "TIBC", "IRON", "RETICCTPCT" in the last 72 hours. Urinalysis    Component Value Date/Time   COLORURINE YELLOW 02/07/2022 1207   APPEARANCEUR CLOUDY (A) 02/07/2022 1207   LABSPEC >1.046 (H) 02/07/2022 1207   PHURINE 5.0 02/07/2022 1207   GLUCOSEU NEGATIVE 02/07/2022 1207   HGBUR SMALL (A) 02/07/2022 1207   BILIRUBINUR NEGATIVE 02/07/2022 1207   KETONESUR 5 (A) 02/07/2022 1207   PROTEINUR 30 (A) 02/07/2022 1207   NITRITE NEGATIVE 02/07/2022 1207   LEUKOCYTESUR LARGE (A) 02/07/2022 1207   Sepsis Labs Recent Labs  Lab 02/11/22 0309 02/12/22 0444 02/13/22 0434 02/14/22 0413  WBC 5.7 4.3 4.9 6.0   Microbiology Recent Results (from the past 240 hour(s))  Urine Culture     Status: Abnormal   Collection Time: 02/09/22  8:07 AM   Specimen: Urine, Clean Catch  Result Value Ref Range Status   Specimen Description   Final    URINE, CLEAN CATCH Performed at [facility], [address] [place], [place] [address]     Special Requests   Final    NONE Performed at [facility], [address] [place], [place] [address]    Culture >=100,000 COLONIES/mL KLEBSIELLA PNEUMONIAE (A)  Final   Report Status 02/11/2022 FINAL  Final   Organism ID, Bacteria  KLEBSIELLA PNEUMONIAE (A)  Final      Susceptibility   Klebsiella pneumoniae - MIC*    AMPICILLIN RESISTANT Resistant     CEFAZOLIN <=4 SENSITIVE Sensitive     CEFEPIME <=0.12 SENSITIVE Sensitive     CEFTRIAXONE <=0.25 SENSITIVE Sensitive     CIPROFLOXACIN <=0.25 SENSITIVE Sensitive     GENTAMICIN <=1 SENSITIVE Sensitive     IMIPENEM <=0.25 SENSITIVE Sensitive     NITROFURANTOIN 128 RESISTANT Resistant     TRIMETH/SULFA <=20 SENSITIVE Sensitive     AMPICILLIN/SULBACTAM 4 SENSITIVE Sensitive     PIP/TAZO <=4 SENSITIVE Sensitive     * >=100,000 COLONIES/mL KLEBSIELLA PNEUMONIAE  Culture, blood (Routine X 2) w Reflex to ID Panel     Status: None   Collection Time: 02/09/22  8:09 AM   Specimen: BLOOD RIGHT HAND  Result Value Ref Range Status   Specimen Description   Final    BLOOD RIGHT HAND BOTTLES DRAWN AEROBIC AND ANAEROBIC   Special Requests   Final    Blood Culture results may not be optimal due to an excessive volume of blood received in culture bottles   Culture   Final    NO GROWTH 5 DAYS Performed at [facility], [address] [place], [place] [address]    Report Status 02/14/2022 FINAL  Final  Culture, blood (Routine X 2) w Reflex to ID Panel     Status: None   Collection Time: 02/09/22  8:11 AM   Specimen: BLOOD LEFT HAND  Result Value Ref Range Status   Specimen Description   Final    BLOOD LEFT HAND BOTTLES DRAWN AEROBIC AND ANAEROBIC   Special Requests   Final    Blood Culture results may not be optimal due to an excessive volume of blood received in culture bottles   Culture   Final    NO GROWTH 5 DAYS Performed at [facility], [address], [place], [place] [address]    Report Status 02/14/2022 FINAL  Final  MRSA Next Gen  by PCR, Nasal     Status: None   Collection Time: 02/09/22  1:15 PM   Specimen: Nasal Mucosa; Nasal Swab  Result Value Ref Range Status   MRSA by PCR Next Gen NOT DETECTED NOT DETECTED Final    Comment: (NOTE) The GeneXpert MRSA Assay (FDA approved for NASAL specimens only), is one component of a comprehensive MRSA colonization surveillance program. It is not intended to diagnose MRSA infection nor to guide or monitor treatment for MRSA infections. Test performance is not FDA approved in patients less than 61 years old. Performed at [facility], [address] [place], [place] [address]   Culture, body fluid w Gram Stain-bottle     Status: None (Preliminary result)   Collection Time: 02/10/22  1:30 PM   Specimen: Peritoneal Washings  Result Value Ref Range Status   Specimen Description PERITONEAL  Final   Special Requests   Final    BOTTLES DRAWN AEROBIC AND ANAEROBIC Blood Culture adequate volume   Culture   Final    NO GROWTH 4 DAYS Performed at [facility], [address] [place], [place] [address]    Report Status PENDING  Incomplete  Gram stain     Status: None   Collection Time: 02/10/22  1:30 PM   Specimen: Peritoneal Washings  Result Value Ref Range Status   Specimen Description PERITONEAL  Final   Special Requests NONE  Final   Gram Stain   Final  WBC PRESENT, PREDOMINANTLY PMN NO ORGANISMS SEEN CYTOSPIN SMEAR Performed at [facility], [address] [place], [place] [address]    Report Status 02/10/2022 FINAL  Final     Time coordinating discharge: 35 minutes  SIGNED:   Rodena Goldmann, DO [facility] 02/14/2022, 11:42 AM  If 7PM-7AM, please contact night-coverage [email or web address]

## 2022-02-14 NOTE — Progress Notes (Signed)
Hospital bed needed:  Patient requires frequent re-positioning of the body in ways that cannot be achieved with  an ordinary bed or wedge pillow, to eliminate pain, reduce pressure, and the head of the  bed to be elevated more than 30 degrees most of the time due to Liver cancer, small hiatal hernia grade 3 varices, Full Liquid diet.

## 2022-02-14 NOTE — Care Management Important Message (Signed)
Important Message  Patient Details  Name: Wanda Curry MRN: [id number] Date of Birth: [birth year]   Medicare Important Message Given:  Yes     [name] 02/14/2022, 11:33 AM

## 2022-02-15 LAB — CULTURE, BODY FLUID W GRAM STAIN -BOTTLE
Culture: NO GROWTH
Special Requests: ADEQUATE

## 2022-02-17 ENCOUNTER — Telehealth: Payer: Self-pay | Admitting: Internal Medicine

## 2022-02-17 NOTE — Telephone Encounter (Signed)
Transition Care Management Unsuccessful Follow-up Telephone Call  Date of discharge and from where:  02/14/22 [facility]  Attempts:  1st Attempt  Reason for unsuccessful TCM follow-up call:  Left voice message on home number

## 2022-02-20 ENCOUNTER — Inpatient Hospital Stay (HOSPITAL_COMMUNITY): Payer: Medicare Other

## 2022-02-20 ENCOUNTER — Inpatient Hospital Stay (HOSPITAL_COMMUNITY): Payer: Medicare Other | Admitting: Hematology

## 2022-02-21 ENCOUNTER — Telehealth: Payer: Self-pay

## 2022-02-21 NOTE — Telephone Encounter (Signed)
Tressia Danas PT with [facility] called in to get orders for pt to get care provided. PT is requesting to get care for 1x a week for 1 week, 2x a week for 4 weeks, and 1x a week for 2 weeks. If any questions please contact:  [facility]/[place] [phone]

## 2022-02-24 ENCOUNTER — Ambulatory Visit: Payer: Medicare Other | Admitting: Internal Medicine

## 2022-02-24 NOTE — Telephone Encounter (Signed)
LVM for ben to call the office

## 2022-02-26 ENCOUNTER — Ambulatory Visit (INDEPENDENT_AMBULATORY_CARE_PROVIDER_SITE_OTHER): Payer: Medicare Other | Admitting: Internal Medicine

## 2022-02-26 ENCOUNTER — Encounter: Payer: Self-pay | Admitting: Internal Medicine

## 2022-02-26 ENCOUNTER — Telehealth: Payer: Self-pay | Admitting: Internal Medicine

## 2022-02-26 DIAGNOSIS — R188 Other ascites: Secondary | ICD-10-CM

## 2022-02-26 DIAGNOSIS — K7460 Unspecified cirrhosis of liver: Secondary | ICD-10-CM

## 2022-02-26 DIAGNOSIS — C220 Liver cell carcinoma: Secondary | ICD-10-CM

## 2022-02-26 DIAGNOSIS — K7682 Hepatic encephalopathy: Secondary | ICD-10-CM | POA: Diagnosis not present

## 2022-02-26 MED ORDER — RIFAXIMIN 550 MG PO TABS: 550.0000 mg | ORAL_TABLET | Freq: Two times a day (BID) | ORAL | 0 refills | Status: DC

## 2022-02-26 NOTE — Assessment & Plan Note (Signed)
Followed by oncology History of colon cancer s/p hemicolectomy

## 2022-02-26 NOTE — Telephone Encounter (Signed)
Patient made a virtual visit to discuss medication with patel 02-26-22

## 2022-02-26 NOTE — Patient Instructions (Signed)
Please continue lactulose as prescribed.  Avoid Imodium for now.

## 2022-02-26 NOTE — Telephone Encounter (Signed)
Daughter called in on patient behalf.   Wants a call back in regard to some medication prescribed to patient from ER .

## 2022-02-26 NOTE — Assessment & Plan Note (Signed)
S/p recent paracentesis - 2.6 l fluid removed Continue Lasix Followed by GI

## 2022-02-26 NOTE — Assessment & Plan Note (Addendum)
Recent hospitalization for hepatic encephalopathy and liver cirrhosis with ascites Had paracentesis for ascites Continue lactulose, hold if watery BM, avoid Imodium for now as she is supposed to get loose BM Ordered Xifaxan- will try to prior authorize F/u with GI

## 2022-02-26 NOTE — Progress Notes (Signed)
Virtual Visit via Telephone Note   This visit type was conducted due to national recommendations for restrictions regarding the COVID-19 Pandemic (e.g. social distancing) in an effort to limit this patient's exposure and mitigate transmission in our community.  Due to her co-morbid illnesses, this patient is at least at moderate risk for complications without adequate follow up.  This format is felt to be most appropriate for this patient at this time.  The patient did not have access to video technology/had technical difficulties with video requiring transitioning to audio format only (telephone).  All issues noted in this document were discussed and addressed.  No physical exam could be performed with this format.  Evaluation Performed:  Follow-up visit  Date:  02/26/2022   ID:  Wanda Curry, DOB 03-27-1946, MRN [id number]  Patient Location: Home Provider Location: Office/Clinic  Participants: Patient and daughter - Wanda Curry Location of Patient: Home Location of Provider: Telehealth Consent was obtain for visit to be over via telehealth. I verified that I am speaking with the correct person using two identifiers.  PCP:  Lindell Spar, MD   Chief Complaint: Hospital discharge follow up  History of Present Illness:    Wanda Curry is a 76 y.o. female who has a televisit for follow-up after recent hospitalization for hepatic encephalopathy and sepsis secondary to UTI.  She had been admitted with nausea/vomiting after EGD and had complicated hospital course due to aspiration pneumonia and UTI.  She had paracentesis for ascites and was placed on rifaximin and lactulose for hepatic encephalopathy.  She was not able to get rifaximin due to prior Auth concern.  She has had loose BM with lactulose, but daughter reports that she tried giving her Imodium for it.  She agrees to avoid Imodium for now.  She denies any fever or chills recently.  The patient does not have symptoms  concerning for COVID-19 infection (fever, chills, cough, or new shortness of breath).   Past Medical, Surgical, Social History, Allergies, and Medications have been Reviewed.  Past Medical History:  Diagnosis Date   Acute pulmonary embolism ([place]) 04/15/2017   Anemia    Arthritis    RA   Cancer (HCC)    skin cancer, colon, liver   Depression    Diabetes mellitus without complication (HCC)    Dyspnea    Fibromyalgia    GERD (gastroesophageal reflux disease)    GI bleed 08/12/2017   Heart murmur    ECHO scheduled 05-25-2015   Hyperlipidemia    Neuropathy    PONV (postoperative nausea and vomiting)    Puncture wound of great toe    Right leg DVT ([place]) 12/01/2020   Past Surgical History:  Procedure Laterality Date   ABDOMINAL HYSTERECTOMY     BIOPSY  09/23/2016   Procedure: BIOPSY;  Surgeon: Danie Binder, MD;  [facility];  Service: Endoscopy;;  hepatic flexure mass   BIOPSY  02/06/2022   Procedure: BIOPSY;  Surgeon: Daneil Dolin, MD;  [facility];  Service: Endoscopy;;   CHOLECYSTECTOMY     COLONOSCOPY N/A 08/13/2017   Procedure: COLONOSCOPY;  Surgeon: Daneil Dolin, MD;  [facility];  Service: Endoscopy;  Laterality: N/A;   COLONOSCOPY WITH PROPOFOL N/A 09/23/2016   Procedure: COLONOSCOPY WITH PROPOFOL;  Surgeon: Danie Binder, MD;  [facility];  Service: Endoscopy;  Laterality: N/A;  7:30 am   ESOPHAGEAL BANDING  02/06/2022   Procedure: ESOPHAGEAL BANDING;  Surgeon: Daneil Dolin, MD;  [facility];  Service: Endoscopy;;   ESOPHAGOGASTRODUODENOSCOPY (EGD) WITH PROPOFOL N/A 02/06/2022   Procedure: ESOPHAGOGASTRODUODENOSCOPY (EGD) WITH PROPOFOL;  Surgeon: Daneil Dolin, MD;  [facility];  Service: Endoscopy;  Laterality: N/A;  patient has 1030 appt at Dr.McKinney's office. Will come to hospital after appt.   HARDWARE REMOVAL Right 05/30/2015   Procedure: HARDWARE REMOVAL;  Surgeon: Ninetta Lights, MD;   Location: [facility];  Service: Orthopedics;  Laterality: Right;   IR ANGIOGRAM SELECTIVE EACH ADDITIONAL VESSEL  07/25/2019   IR ANGIOGRAM SELECTIVE EACH ADDITIONAL VESSEL  07/25/2019   IR ANGIOGRAM SELECTIVE EACH ADDITIONAL VESSEL  01/02/2021   IR ANGIOGRAM SELECTIVE EACH ADDITIONAL VESSEL  01/02/2021   IR ANGIOGRAM SELECTIVE EACH ADDITIONAL VESSEL  01/02/2021   IR ANGIOGRAM SELECTIVE EACH ADDITIONAL VESSEL  01/18/2021   IR ANGIOGRAM SELECTIVE EACH ADDITIONAL VESSEL  02/15/2021   IR ANGIOGRAM SELECTIVE EACH ADDITIONAL VESSEL  02/15/2021   IR ANGIOGRAM VISCERAL SELECTIVE  07/25/2019   IR ANGIOGRAM VISCERAL SELECTIVE  07/25/2019   IR ANGIOGRAM VISCERAL SELECTIVE  01/02/2021   IR ANGIOGRAM VISCERAL SELECTIVE  01/02/2021   IR ANGIOGRAM VISCERAL SELECTIVE  01/18/2021   IR ANGIOGRAM VISCERAL SELECTIVE  02/15/2021   IR EMBO ARTERIAL NOT HEMORR HEMANG INC GUIDE ROADMAPPING  01/02/2021   IR EMBO TUMOR ORGAN ISCHEMIA INFARCT INC GUIDE ROADMAPPING  07/25/2019   IR EMBO TUMOR ORGAN ISCHEMIA INFARCT INC GUIDE ROADMAPPING  01/18/2021   IR EMBO TUMOR ORGAN ISCHEMIA INFARCT INC GUIDE ROADMAPPING  02/15/2021   IR RADIOLOGIST EVAL & MGMT  06/09/2019   IR RADIOLOGIST EVAL & MGMT  07/07/2019   IR RADIOLOGIST EVAL & MGMT  01/05/2020   IR RADIOLOGIST EVAL & MGMT  04/12/2020   IR RADIOLOGIST EVAL & MGMT  09/27/2020   IR RADIOLOGIST EVAL & MGMT  10/31/2020   IR RADIOLOGIST EVAL & MGMT  12/12/2020   IR RADIOLOGIST EVAL & MGMT  04/04/2021   IR RADIOLOGIST EVAL & MGMT  05/23/2021   IR RADIOLOGIST EVAL & MGMT  12/17/2021   IR US GUIDE VASC ACCESS RIGHT  07/25/2019   IR US GUIDE VASC ACCESS RIGHT  01/02/2021   IR US GUIDE VASC ACCESS RIGHT  01/18/2021   IR US GUIDE VASC ACCESS RIGHT  02/15/2021   PARTIAL COLECTOMY N/A 10/10/2016   Procedure: PARTIAL COLECTOMY;  Surgeon: Aviva Signs, MD;  [facility];  Service: General;  Laterality: N/A;   planter warts Bilateral    both feet   POLYPECTOMY  09/23/2016   Procedure: POLYPECTOMY;  Surgeon: Danie Binder, MD;  [facility];  Service: Endoscopy;;  transverse colon polyps times 2, rectal polyp   PORTACATH PLACEMENT Left 01/08/2022   Procedure: INSERTION PORT-A-CATH;  Surgeon: Aviva Signs, MD;  [facility];  Service: General;  Laterality: Left;   skin grafts     due to planter warts   TOTAL KNEE ARTHROPLASTY Right 05/30/2015   Procedure: RIGHT TOTAL KNEE ARTHROPLASTY;  Surgeon: Ninetta Lights, MD;  Location: [facility];  Service: Orthopedics;  Laterality: Right;     Current Meds  Medication Sig   acetaminophen (TYLENOL) 500 MG tablet Take 500 mg by mouth every 6 (six) hours as needed for moderate pain.   Atezolizumab (TECENTRIQ IV) Inject into the vein every 21 ( twenty-one) days.   Bevacizumab (AVASTIN IV) Inject into the vein every 21 ( twenty-one) days.   carvedilol (COREG) 6.25 MG tablet Take 6.25  mg by mouth 2 (two) times daily with a meal.   cholecalciferol (VITAMIN D3) 25 MCG (1000 UNIT) tablet Take 1,000 Units by mouth daily.   folic acid (FOLVITE) 1 MG tablet Take 1 mg by mouth daily.   furosemide (LASIX) 20 MG tablet Take 20 mg by mouth daily as needed for edema.   gabapentin (NEURONTIN) 600 MG tablet Take 600 mg by mouth 3 (three) times daily as needed (pain).   Glucagon (GVOKE HYPOPEN 2-PACK) 1 MG/0.2ML SOAJ Inject 0.2 mLs into the skin as needed (Blood glucose less than 55).   glucosamine-chondroitin 500-400 MG tablet Take 2 tablets by mouth daily.   insulin degludec (TRESIBA FLEXTOUCH) 100 UNIT/ML FlexTouch Pen Inject 40 Units into the skin at bedtime.   lactulose (CHRONULAC) 10 GM/15ML solution Take 30 mLs (20 g total) by mouth daily.   lidocaine-prilocaine (EMLA) cream Apply to affected area once   loperamide (IMODIUM) 2 MG capsule Take 2 mg by mouth as needed for diarrhea or loose stools.   magnesium oxide (MAG-OX) 400 (240 Mg) MG tablet Take 1 tablet (400 mg total) by mouth 3 (three) times daily.   methotrexate (RHEUMATREX) 2.5 MG tablet Take 20 mg by  mouth every Sunday. Caution:Chemotherapy. Protect from light.   NOVOLOG MIX 70/30 FLEXPEN (70-30) 100 UNIT/ML FlexPen Inject 20-30 Units into the skin See admin instructions. Inject 30 units into the skin in the morning and inject 20 units in the evening   omeprazole (PRILOSEC) 40 MG capsule Take 40 mg by mouth daily.    prochlorperazine (COMPAZINE) 10 MG tablet Take 1 tablet (10 mg total) by mouth every 6 (six) hours as needed for nausea or vomiting.   rivaroxaban (XARELTO) 20 MG TABS tablet Take 1 tablet (20 mg total) by mouth daily with supper.   rOPINIRole (REQUIP) 1 MG tablet Take 1 mg by mouth 3 (three) times daily as needed (restless leg).   traMADol (ULTRAM) 50 MG tablet Take 1 tablet (50 mg total) by mouth every 6 (six) hours as needed.   [DISCONTINUED] rifaximin (XIFAXAN) 550 MG TABS tablet Take 1 tablet (550 mg total) by mouth 2 (two) times daily.     Allergies:   Xeloda [capecitabine], Adhesive [tape], Neosporin original [bacitracin-neomycin-polymyxin], Sulfa antibiotics, and Erythromycin   ROS:   Please see the history of present illness.     All other systems reviewed and are negative.   Labs/Other Tests and Data Reviewed:    Recent Labs: 12/26/2021: TSH 0.092 02/09/2022: B Natriuretic Peptide 329.0 02/12/2022: Magnesium 1.6 02/13/2022: ALT 18 02/14/2022: BUN 14; Creatinine, Ser 0.69; Hemoglobin 8.5; Platelets 98; Potassium 3.4; Sodium 139   Recent Lipid Panel Lab Results  Component Value Date/Time   TRIG 61 02/12/2022 04:44 AM    Wt Readings from Last 3 Encounters:  02/14/22 214 lb 4.6 oz (97.2 kg)  02/04/22 223 lb 14.4 oz (101.6 kg)  01/30/22 223 lb 14.4 oz (101.6 kg)     ASSESSMENT & PLAN:    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HCC) Recent hospitalization for hepatic encephalopathy and liver cirrhosis with ascites Had paracentesis for ascites Continue lactulose, hold if watery BM, avoid Imodium for now as she is supposed to get loose BM Ordered Xifaxan- will try to prior  authorize F/u with GI  Hepatocellular carcinoma ([place]) Followed by oncology History of colon cancer s/p hemicolectomy  Cirrhosis of liver with ascites ([place]) S/p recent paracentesis - 2.6 l fluid removed Continue Lasix Followed by GI    Time:   Today, I have spent  19 minutes reviewing the chart, including problem list, medications, and with the patient with telehealth technology discussing the above problems.   Medication Adjustments/Labs and Tests Ordered: Current medicines are reviewed at length with the patient today.  Concerns regarding medicines are outlined above.   Tests Ordered: No orders of the defined types were placed in this encounter.   Medication Changes: Meds ordered this encounter  Medications   rifaximin (XIFAXAN) 550 MG TABS tablet    Sig: Take 1 tablet (550 mg total) by mouth 2 (two) times daily.    Dispense:  56 tablet    Refill:  0     Note: This dictation was prepared with Dragon dictation along with smaller phrase technology. Similar sounding words can be transcribed inadequately or may not be corrected upon review. Any transcriptional errors that result from this process are unintentional.      Disposition:  Follow up  Signed, Lindell Spar, MD  02/26/2022 12:25 PM     [employer]

## 2022-02-27 ENCOUNTER — Inpatient Hospital Stay (HOSPITAL_COMMUNITY): Payer: Medicare Other

## 2022-03-04 ENCOUNTER — Encounter: Payer: Self-pay | Admitting: Internal Medicine

## 2022-03-04 ENCOUNTER — Ambulatory Visit (INDEPENDENT_AMBULATORY_CARE_PROVIDER_SITE_OTHER): Payer: Medicare Other | Admitting: Internal Medicine

## 2022-03-04 DIAGNOSIS — K7682 Hepatic encephalopathy: Secondary | ICD-10-CM | POA: Diagnosis not present

## 2022-03-04 MED ORDER — RIFAXIMIN 550 MG PO TABS: 550.0000 mg | ORAL_TABLET | Freq: Two times a day (BID) | ORAL | 0 refills | Status: AC

## 2022-03-04 NOTE — Patient Instructions (Signed)
It was good to see you again today!  Continue to advance diet as tolerated  No future endoscopy recommended at this time so long as you continue taking Coreg or carvedilol daily  Continue Prilosec 40 mg each morning before breakfast  With lactulose, our goal is 3-4 semiformed stools daily; for now utilize low-dose of 5 cc or 1 teaspoon daily  Do recommend Xifaxan 550 mg twice daily for hepatic encephalopathy.  We will submit a new prescription and pursue prior authorization  Keep your follow-up appoint with Dr. Delton Coombes  I will plan to see you back here in 3 months and as needed

## 2022-03-04 NOTE — Progress Notes (Unsigned)
Primary Care Physician:  Lindell Spar, MD Primary Gastroenterologist:  Dr. Gala Romney  Pre-Procedure History & Physical: HPI:  Wanda Curry is a 76 y.o. female with history of [place]/cirrhosis complicated by hepatocellular carcinoma recently hospitalized after going to EGD with esophageal band ligation of esophageal varices.  Patient developed esophageal dysphagia nausea and vomiting after the procedure which was protracted and is slowly improved.  No apparent complications related to the procedure directly. She has had bouts of encephalopathy for which she has been on lactulose which she hates.  She has intermittent diarrhea anyway.  She is accompanied by her daughter.  They were initially giving Imodium for the diarrhea but stopped this medication at the direction of Dr. Posey Pronto. Has not been able to get Xifaxan as prior authorization not accomplished. Due to patient being on treatment with Tecentriq and Avastatin, endoscopic treatment was requested and employed.    Past Medical History:  Diagnosis Date   Acute pulmonary embolism ([facility]) 04/15/2017   Anemia    Arthritis    RA   Cancer (HCC)    skin cancer, colon, liver   Depression    Diabetes mellitus without complication (HCC)    Dyspnea    Fibromyalgia    GERD (gastroesophageal reflux disease)    GI bleed 08/12/2017   Heart murmur    ECHO scheduled 05-25-2015   Hyperlipidemia    Neuropathy    PONV (postoperative nausea and vomiting)    Puncture wound of great toe    Right leg DVT ([place]) 12/01/2020    Past Surgical History:  Procedure Laterality Date   ABDOMINAL HYSTERECTOMY     BIOPSY  09/23/2016   Procedure: BIOPSY;  Surgeon: Danie Binder, MD;  [facility];  Service: Endoscopy;;  hepatic flexure mass   BIOPSY  02/06/2022   Procedure: BIOPSY;  Surgeon: Daneil Dolin, MD;  [facility];  Service: Endoscopy;;   CHOLECYSTECTOMY     COLONOSCOPY N/A 08/13/2017   Procedure: COLONOSCOPY;  Surgeon:  Daneil Dolin, MD;  [facility];  Service: Endoscopy;  Laterality: N/A;   COLONOSCOPY WITH PROPOFOL N/A 09/23/2016   Procedure: COLONOSCOPY WITH PROPOFOL;  Surgeon: Danie Binder, MD;  [facility];  Service: Endoscopy;  Laterality: N/A;  7:30 am   ESOPHAGEAL BANDING  02/06/2022   Procedure: ESOPHAGEAL BANDING;  Surgeon: Daneil Dolin, MD;  [facility];  Service: Endoscopy;;   ESOPHAGOGASTRODUODENOSCOPY (EGD) WITH PROPOFOL N/A 02/06/2022   Procedure: ESOPHAGOGASTRODUODENOSCOPY (EGD) WITH PROPOFOL;  Surgeon: Daneil Dolin, MD;  [facility];  Service: Endoscopy;  Laterality: N/A;  patient has 1030 appt at Dr.McKinney's office. Will come to hospital after appt.   HARDWARE REMOVAL Right 05/30/2015   Procedure: HARDWARE REMOVAL;  Surgeon: Ninetta Lights, MD;  Location: [facility];  Service: Orthopedics;  Laterality: Right;   IR ANGIOGRAM SELECTIVE EACH ADDITIONAL VESSEL  07/25/2019   IR ANGIOGRAM SELECTIVE EACH ADDITIONAL VESSEL  07/25/2019   IR ANGIOGRAM SELECTIVE EACH ADDITIONAL VESSEL  01/02/2021   IR ANGIOGRAM SELECTIVE EACH ADDITIONAL VESSEL  01/02/2021   IR ANGIOGRAM SELECTIVE EACH ADDITIONAL VESSEL  01/02/2021   IR ANGIOGRAM SELECTIVE EACH ADDITIONAL VESSEL  01/18/2021   IR ANGIOGRAM SELECTIVE EACH ADDITIONAL VESSEL  02/15/2021   IR ANGIOGRAM SELECTIVE EACH ADDITIONAL VESSEL  02/15/2021   IR ANGIOGRAM VISCERAL SELECTIVE  07/25/2019   IR ANGIOGRAM VISCERAL SELECTIVE  07/25/2019   IR ANGIOGRAM VISCERAL SELECTIVE  01/02/2021  IR ANGIOGRAM VISCERAL SELECTIVE  01/02/2021   IR ANGIOGRAM VISCERAL SELECTIVE  01/18/2021   IR ANGIOGRAM VISCERAL SELECTIVE  02/15/2021   IR EMBO ARTERIAL NOT HEMORR HEMANG INC GUIDE ROADMAPPING  01/02/2021   IR EMBO TUMOR ORGAN ISCHEMIA INFARCT INC GUIDE ROADMAPPING  07/25/2019   IR EMBO TUMOR ORGAN ISCHEMIA INFARCT INC GUIDE ROADMAPPING  01/18/2021   IR EMBO TUMOR ORGAN ISCHEMIA INFARCT INC GUIDE ROADMAPPING  02/15/2021   IR RADIOLOGIST EVAL &  MGMT  06/09/2019   IR RADIOLOGIST EVAL & MGMT  07/07/2019   IR RADIOLOGIST EVAL & MGMT  01/05/2020   IR RADIOLOGIST EVAL & MGMT  04/12/2020   IR RADIOLOGIST EVAL & MGMT  09/27/2020   IR RADIOLOGIST EVAL & MGMT  10/31/2020   IR RADIOLOGIST EVAL & MGMT  12/12/2020   IR RADIOLOGIST EVAL & MGMT  04/04/2021   IR RADIOLOGIST EVAL & MGMT  05/23/2021   IR RADIOLOGIST EVAL & MGMT  12/17/2021   IR US GUIDE VASC ACCESS RIGHT  07/25/2019   IR US GUIDE VASC ACCESS RIGHT  01/02/2021   IR US GUIDE VASC ACCESS RIGHT  01/18/2021   IR US GUIDE VASC ACCESS RIGHT  02/15/2021   PARTIAL COLECTOMY N/A 10/10/2016   Procedure: PARTIAL COLECTOMY;  Surgeon: Aviva Signs, MD;  [facility];  Service: General;  Laterality: N/A;   planter warts Bilateral    both feet   POLYPECTOMY  09/23/2016   Procedure: POLYPECTOMY;  Surgeon: Danie Binder, MD;  [facility];  Service: Endoscopy;;  transverse colon polyps times 2, rectal polyp   PORTACATH PLACEMENT Left 01/08/2022   Procedure: INSERTION PORT-A-CATH;  Surgeon: Aviva Signs, MD;  [facility];  Service: General;  Laterality: Left;   skin grafts     due to planter warts   TOTAL KNEE ARTHROPLASTY Right 05/30/2015   Procedure: RIGHT TOTAL KNEE ARTHROPLASTY;  Surgeon: Ninetta Lights, MD;  Location: [facility];  Service: Orthopedics;  Laterality: Right;    Prior to Admission medications   Medication Sig Start Date End Date Taking? Authorizing Provider  acetaminophen (TYLENOL) 500 MG tablet Take 500 mg by mouth every 6 (six) hours as needed for moderate pain.   Yes [provider]  Atezolizumab (TECENTRIQ IV) Inject into the vein every 21 ( twenty-one) days. 12/26/21  Yes [provider]  Bevacizumab (AVASTIN IV) Inject into the vein every 21 ( twenty-one) days. 12/26/21  Yes [provider]  carvedilol (COREG) 6.25 MG tablet Take 6.25 mg by mouth 2 (two) times daily with a meal.   Yes [provider]  cholecalciferol (VITAMIN D3) 25  MCG (1000 UNIT) tablet Take 1,000 Units by mouth daily.   Yes [provider]  folic acid (FOLVITE) 1 MG tablet Take 1 mg by mouth daily.   Yes [provider]  furosemide (LASIX) 20 MG tablet Take 20 mg by mouth daily as needed for edema. 07/24/19  Yes [provider]  gabapentin (NEURONTIN) 600 MG tablet Take 600 mg by mouth 3 (three) times daily as needed (pain). 09/03/21  Yes [provider]  Glucagon (GVOKE HYPOPEN 2-PACK) 1 MG/0.2ML SOAJ Inject 0.2 mLs into the skin as needed (Blood glucose less than 55). 11/08/21  Yes Lindell Spar, MD  glucosamine-chondroitin 500-400 MG tablet Take 2 tablets by mouth daily.   Yes [provider]  insulin degludec (TRESIBA FLEXTOUCH) 100 UNIT/ML FlexTouch Pen Inject 40 Units into the skin at bedtime. 11/08/21  Yes Posey Pronto, Rutwik  K, MD  lidocaine-prilocaine (EMLA) cream Apply to affected area once 12/26/21  Yes Derek Jack, MD  loperamide (IMODIUM) 2 MG capsule Take 2 mg by mouth as needed for diarrhea or loose stools.   Yes [provider]  magnesium oxide (MAG-OX) 400 (240 Mg) MG tablet Take 1 tablet (400 mg total) by mouth 3 (three) times daily. 01/30/22  Yes Derek Jack, MD  methotrexate (RHEUMATREX) 2.5 MG tablet Take 20 mg by mouth every Sunday. Caution:Chemotherapy. Protect from light.   Yes [provider]  NOVOLOG MIX 70/30 FLEXPEN (70-30) 100 UNIT/ML FlexPen Inject 20-30 Units into the skin See admin instructions. Inject 30 units into the skin in the morning and inject 20 units in the evening 09/14/16  Yes [provider]  omeprazole (PRILOSEC) 40 MG capsule Take 40 mg by mouth daily.  11/17/16  Yes [provider]  prochlorperazine (COMPAZINE) 10 MG tablet Take 1 tablet (10 mg total) by mouth every 6 (six) hours as needed for nausea or vomiting. 12/26/21  Yes Derek Jack, MD  rivaroxaban (XARELTO) 20 MG TABS tablet Take 1 tablet (20 mg total) by mouth  daily with supper. 01/27/22  Yes Derek Jack, MD  rOPINIRole (REQUIP) 1 MG tablet Take 1 mg by mouth 3 (three) times daily as needed (restless leg).   Yes [provider]  lactulose (CHRONULAC) 10 GM/15ML solution Take 30 mLs (20 g total) by mouth daily. Patient not taking: Reported on 03/04/2022 02/15/22   Heath Lark D, DO  rifaximin (XIFAXAN) 550 MG TABS tablet Take 1 tablet (550 mg total) by mouth 2 (two) times daily. Patient not taking: Reported on 03/04/2022 02/26/22 03/28/22  Lindell Spar, MD    Allergies as of 03/04/2022 - Review Complete 03/04/2022  Allergen Reaction Noted   Xeloda [capecitabine] Other (See Comments) 12/11/2020   Adhesive [tape]  05/16/2015   Neosporin original [bacitracin-neomycin-polymyxin] Other (See Comments) 11/29/2020   Sulfa antibiotics Nausea Only and Other (See Comments) 05/30/2015   Erythromycin Itching and Rash 05/16/2015    Family History  Problem Relation Age of Onset   Breast cancer Mother    Breast cancer Daughter    Colon cancer Neg Hx     Social History   Socioeconomic History   Marital status: Widowed    Spouse name: Not on file   Number of children: Not on file   Years of education: Not on file   Highest education level: Not on file  Occupational History   Not on file  Tobacco Use   Smoking status: Former    Packs/day: 1.00    Years: 40.00    Total pack years: 40.00    Types: Cigarettes    Quit date: 08/18/2004    Years since quitting: 17.5   Smokeless tobacco: Never  Vaping Use   Vaping Use: Never used  Substance and Sexual Activity   Alcohol use: No   Drug use: No   Sexual activity: Not Currently    Birth control/protection: Surgical  Other Topics Concern   Not on file  Social History Narrative   Not on file   Social Determinants of Health   Financial Resource Strain: Low Risk  (01/29/2022)   Overall Financial Resource Strain (CARDIA)    Difficulty of Paying Living Expenses: Not hard at all  Food  Insecurity: No Food Insecurity (01/29/2022)   Hunger Vital Sign    Worried About Running Out of Food in the Last Year: Never true    Ran Out of  Food in the Last Year: Never true  Transportation Needs: No Transportation Needs (01/29/2022)   PRAPARE - Hydrologist (Medical): No    Lack of Transportation (Non-Medical): No  Physical Activity: Insufficiently Active (01/29/2022)   Exercise Vital Sign    Days of Exercise per Week: 7 days    Minutes of Exercise per Session: 20 min  Stress: No Stress Concern Present (01/29/2022)   [place]    Feeling of Stress : Not at all  Social Connections: Moderately Isolated (01/29/2022)   Social Connection and Isolation Panel [NHANES]    Frequency of Communication with Friends and Family: More than three times a week    Frequency of Social Gatherings with Friends and Family: More than three times a week    Attends Religious Services: 1 to 4 times per year    Active Member of [employer] or Organizations: No    Attends Archivist Meetings: Never    Marital Status: Widowed  Intimate Partner Violence: Not At Risk (01/29/2022)   Humiliation, Afraid, Rape, and Kick questionnaire    Fear of Current or Ex-Partner: No    Emotionally Abused: No    Physically Abused: No    Sexually Abused: No    Review of Systems: See HPI, otherwise negative ROS  Physical Exam: BP 134/64 (BP Location: Left Arm, Patient Position: Sitting, Cuff Size: Normal)   Pulse 92   Temp 97.7 F (36.5 C) (Temporal)   Ht '5\' 3"'$  (1.6 m)   Wt 215 lb 9.6 oz (97.8 kg)   SpO2 96%   BMI 38.19 kg/m  General:   Alert,   pleasant and cooperative in NAD; accompanied by her daughter Bethena Roys. Neck:  Supple; no masses or thyromegaly. No significant cervical adenopathy. Lungs:  Clear throughout to auscultation.   No wheezes, crackles, or rhonchi. No acute distress. Heart:  Regular rate and rhythm; no  murmurs, clicks, rubs,  or gallops. Abdomen: Non-distended, normal bowel sounds.  Soft and nontender without appreciable mass or hepatosplenomegaly.  Pulses:  Normal pulses noted. Extremities:  Without clubbing or edema.  Impression/Plan: Pleasant but unfortunate 76 year old lady with Karlene Lineman cirrhosis complicated by hepatocellular carcinoma undergoing palliative treatment under the direction Dr. Delton Coombes as described above he recently underwent EGD and was found to have esophageal varices with banding.  Postprocedure course complicated by nausea,  vomiting,  dysphagia which is slowly improved.  Not mentioned above, barium study post procedure indicated dysmotility. Patient is gradually improved.  Her oral intake is slowly improved. Also of hepatic encephalopathy.  Lactulose has been difficult for patient to tolerate.  Xifaxan prescribed but has not been appropriated as of yet. Patient understandably does not want to have another upper endoscopy.  In this setting, I think we can follow her clinically so long as she continues a nonselective beta-blocker in the way of carvedilol as primary prophylaxis against future esophageal variceal hemorrhage.  Recommendations:  Continue to advance diet as tolerated  No future endoscopy recommended at this time so long as you continue taking Coreg or carvedilol daily  Continue Prilosec 40 mg each morning before breakfast  With lactulose, our goal is 3-4 semiformed stools daily; for now utilize low-dose of 5 cc or 1 teaspoon daily  Do recommend Xifaxan 550 mg twice daily for hepatic encephalopathy.  We will submit a new prescription and pursue prior authorization  Keep your follow-up appoint with Dr. Delton Coombes  I will plan to see  you back here in 3 months and as needed    Notice: This dictation was prepared with Dragon dictation along with smaller phrase technology. Any transcriptional errors that result from this process are unintentional and may not be  corrected upon review.

## 2022-03-05 ENCOUNTER — Inpatient Hospital Stay (HOSPITAL_COMMUNITY): Payer: Medicare Other | Attending: Hematology

## 2022-03-05 VITALS — BP 104/42 | HR 84 | Temp 99.0°F | Resp 18

## 2022-03-05 DIAGNOSIS — E611 Iron deficiency: Secondary | ICD-10-CM | POA: Insufficient documentation

## 2022-03-05 DIAGNOSIS — C182 Malignant neoplasm of ascending colon: Secondary | ICD-10-CM | POA: Insufficient documentation

## 2022-03-05 DIAGNOSIS — C220 Liver cell carcinoma: Secondary | ICD-10-CM | POA: Diagnosis present

## 2022-03-05 DIAGNOSIS — Z79899 Other long term (current) drug therapy: Secondary | ICD-10-CM | POA: Insufficient documentation

## 2022-03-05 DIAGNOSIS — Z5112 Encounter for antineoplastic immunotherapy: Secondary | ICD-10-CM | POA: Diagnosis not present

## 2022-03-05 DIAGNOSIS — D649 Anemia, unspecified: Secondary | ICD-10-CM

## 2022-03-05 DIAGNOSIS — K5791 Diverticulosis of intestine, part unspecified, without perforation or abscess with bleeding: Secondary | ICD-10-CM

## 2022-03-05 DIAGNOSIS — N183 Chronic kidney disease, stage 3 unspecified: Secondary | ICD-10-CM

## 2022-03-05 MED ORDER — SODIUM CHLORIDE 0.9 % IV SOLN: Freq: Once | INTRAVENOUS | Status: AC

## 2022-03-05 MED ORDER — SODIUM CHLORIDE 0.9% FLUSH
10.0000 mL | Freq: Once | INTRAVENOUS | Status: AC | PRN
  Administered 2022-03-05: 10 mL

## 2022-03-05 MED ORDER — ACETAMINOPHEN 325 MG PO TABS
650.0000 mg | ORAL_TABLET | Freq: Once | ORAL | Status: AC
  Administered 2022-03-05: 650 mg via ORAL
  Filled 2022-03-05: qty 2

## 2022-03-05 MED ORDER — SODIUM CHLORIDE 0.9 % IV SOLN
300.0000 mg | Freq: Once | INTRAVENOUS | Status: AC
  Administered 2022-03-05: 300 mg via INTRAVENOUS
  Filled 2022-03-05: qty 300
  Filled 2022-03-05: qty 15

## 2022-03-05 MED ORDER — HEPARIN SOD (PORK) LOCK FLUSH 100 UNIT/ML IV SOLN
500.0000 [IU] | Freq: Once | INTRAVENOUS | Status: AC | PRN
  Administered 2022-03-05: 500 [IU]

## 2022-03-05 MED ORDER — LORATADINE 10 MG PO TABS
10.0000 mg | ORAL_TABLET | Freq: Once | ORAL | Status: AC
  Administered 2022-03-05: 10 mg via ORAL
  Filled 2022-03-05: qty 1

## 2022-03-05 NOTE — Patient Instructions (Signed)
[facility]  Discharge Instructions: Thank you for choosing [facility] to provide your oncology and hematology care.  If you have a lab appointment with the Cancer Center, please come in thru the Main Entrance and check in at the main information desk.  Wear comfortable clothing and clothing appropriate for easy access to any Portacath or PICC line.   We strive to give you quality time with your provider. You may need to reschedule your appointment if you arrive late (15 or more minutes).  Arriving late affects you and other patients whose appointments are after yours.  Also, if you miss three or more appointments without notifying the office, you may be dismissed from the clinic at the provider's discretion.      For prescription refill requests, have your pharmacy contact our office and allow 72 hours for refills to be completed.    Today you received the following chemotherapy and/or immunotherapy agents Venofer      To help prevent nausea and vomiting after your treatment, we encourage you to take your nausea medication as directed.  BELOW ARE SYMPTOMS THAT SHOULD BE REPORTED IMMEDIATELY: *FEVER GREATER THAN 100.4 F (38 C) OR HIGHER *CHILLS OR SWEATING *NAUSEA AND VOMITING THAT IS NOT CONTROLLED WITH YOUR NAUSEA MEDICATION *UNUSUAL SHORTNESS OF BREATH *UNUSUAL BRUISING OR BLEEDING *URINARY PROBLEMS (pain or burning when urinating, or frequent urination) *BOWEL PROBLEMS (unusual diarrhea, constipation, pain near the anus) TENDERNESS IN MOUTH AND THROAT WITH OR WITHOUT PRESENCE OF ULCERS (sore throat, sores in mouth, or a toothache) UNUSUAL RASH, SWELLING OR PAIN  UNUSUAL VAGINAL DISCHARGE OR ITCHING   Items with * indicate a potential emergency and should be followed up as soon as possible or go to the Emergency Department if any problems should occur.  Please show the CHEMOTHERAPY ALERT CARD or IMMUNOTHERAPY ALERT CARD at check-in to the Emergency  Department and triage nurse.  Should you have questions after your visit or need to cancel or reschedule your appointment, please contact [facility] [phone]  and follow the prompts.  Office hours are 8:00 a.m. to 4:30 p.m. Monday - Friday. Please note that voicemails left after 4:00 p.m. may not be returned until the following business day.  We are closed weekends and major holidays. You have access to a nurse at all times for urgent questions. Please call the main number to the clinic [phone] and follow the prompts.  For any non-urgent questions, you may also contact your provider using MyChart. We now offer e-Visits for anyone 18 and older to request care online for non-urgent symptoms. For details visit [email or web address].   Also download the MyChart app! Go to the app store, search "MyChart", open the app, select [facility], and log in with your MyChart username and password.  Masks are optional in the cancer centers. If you would like for your care team to wear a mask while they are taking care of you, please let them know. For doctor visits, patients may have with them one support person who is at least 78 years old. At this time, visitors are not allowed in the infusion area.  

## 2022-03-05 NOTE — Progress Notes (Signed)
Patient presents today for 300 mg Venofer infusion per providers order.  Vital signs WNL.  Patient has no new complaints at this time.  Patient has been scheduled for an additional 300 Venofer and a 400 Venofer.  Stable during infusion without adverse affects.  Vital signs stable.  No complaints at this time.  Discharge from clinic ambulatory in stable condition.  Alert and oriented X 3.  Follow up with [facility] as scheduled.

## 2022-03-09 DIAGNOSIS — K7460 Unspecified cirrhosis of liver: Secondary | ICD-10-CM | POA: Diagnosis not present

## 2022-03-09 DIAGNOSIS — N390 Urinary tract infection, site not specified: Secondary | ICD-10-CM

## 2022-03-09 DIAGNOSIS — D630 Anemia in neoplastic disease: Secondary | ICD-10-CM | POA: Diagnosis not present

## 2022-03-09 DIAGNOSIS — C189 Malignant neoplasm of colon, unspecified: Secondary | ICD-10-CM | POA: Diagnosis not present

## 2022-03-09 DIAGNOSIS — I129 Hypertensive chronic kidney disease with stage 1 through stage 4 chronic kidney disease, or unspecified chronic kidney disease: Secondary | ICD-10-CM

## 2022-03-09 DIAGNOSIS — J189 Pneumonia, unspecified organism: Secondary | ICD-10-CM

## 2022-03-09 DIAGNOSIS — K7682 Hepatic encephalopathy: Secondary | ICD-10-CM

## 2022-03-09 DIAGNOSIS — N183 Chronic kidney disease, stage 3 unspecified: Secondary | ICD-10-CM

## 2022-03-09 DIAGNOSIS — C220 Liver cell carcinoma: Secondary | ICD-10-CM | POA: Diagnosis not present

## 2022-03-09 DIAGNOSIS — E1122 Type 2 diabetes mellitus with diabetic chronic kidney disease: Secondary | ICD-10-CM

## 2022-03-10 ENCOUNTER — Other Ambulatory Visit: Payer: Self-pay

## 2022-03-10 ENCOUNTER — Telehealth: Payer: Self-pay | Admitting: Internal Medicine

## 2022-03-10 NOTE — Telephone Encounter (Signed)
Patient will need an appointment.  

## 2022-03-10 NOTE — Telephone Encounter (Signed)
Wanda Curry with [facility], [phone]--called stating she had 2 falls on Saturday. In generalized pain-soreness, small skin tare on rt lower extreme lateral, did bite her lip & with a small sore that bleed. Pt is wanting to know if she can please get something for the pain (like tramadol)? She has been taking tylenol with no relief.      [facility]

## 2022-03-11 ENCOUNTER — Encounter: Payer: Self-pay | Admitting: Internal Medicine

## 2022-03-11 ENCOUNTER — Ambulatory Visit (INDEPENDENT_AMBULATORY_CARE_PROVIDER_SITE_OTHER): Payer: Medicare Other | Admitting: Internal Medicine

## 2022-03-11 DIAGNOSIS — M7989 Other specified soft tissue disorders: Secondary | ICD-10-CM

## 2022-03-11 DIAGNOSIS — W19XXXA Unspecified fall, initial encounter: Secondary | ICD-10-CM | POA: Diagnosis not present

## 2022-03-11 DIAGNOSIS — M069 Rheumatoid arthritis, unspecified: Secondary | ICD-10-CM | POA: Diagnosis not present

## 2022-03-11 MED ORDER — TRAMADOL HCL 50 MG PO TABS: 50.0000 mg | ORAL_TABLET | Freq: Three times a day (TID) | ORAL | 0 refills | Status: AC | PRN

## 2022-03-11 NOTE — Patient Instructions (Signed)
Please take tramadol as needed for severe pain.

## 2022-03-11 NOTE — Progress Notes (Signed)
Virtual Visit via Telephone Note   This visit type was conducted due to national recommendations for restrictions regarding the COVID-19 Pandemic (e.g. social distancing) in an effort to limit this patient's exposure and mitigate transmission in our community.  Due to her co-morbid illnesses, this patient is at least at moderate risk for complications without adequate follow up.  This format is felt to be most appropriate for this patient at this time.  The patient did not have access to video technology/had technical difficulties with video requiring transitioning to audio format only (telephone).  All issues noted in this document were discussed and addressed.  No physical exam could be performed with this format.  Evaluation Performed:  Follow-up visit  Date:  03/11/2022   ID:  Wanda Curry, Wanda Curry 11-30-1945, MRN [id number]  Patient Location: Home Provider Location: Office/Clinic  Participants: Patient and daughter - Wanda Curry Location of Patient: Home Location of Provider: Telehealth Consent was obtain for visit to be over via telehealth. I verified that I am speaking with the correct person using two identifiers.  PCP:  Lindell Spar, MD   Chief Complaint: Generalized soreness s/p fall  History of Present Illness:    Wanda Curry is a 76 y.o. female who has a televisit for complaint of generalized soreness after 2 falls on 07/22.  Her daughter reports that the patient was trying to walk without walker and fell on her back.  She had another fall on the same day while using bathroom.  Daughter - Wanda Curry was with her at that time, but did not sustain any major injury.  Denies any head injury.  Denies any major bruising.  She has generalized soreness.  Of note, she also has history of RA.  Denies any recent fever or chills.  Daughter also reports severe leg swelling despite taking Lasix since starting chemotherapy.  She has tried leg elevation as well.  The patient does not have  symptoms concerning for COVID-19 infection (fever, chills, cough, or new shortness of breath).   Past Medical, Surgical, Social History, Allergies, and Medications have been Reviewed.  Past Medical History:  Diagnosis Date   Acute pulmonary embolism ([place]) 04/15/2017   Anemia    Arthritis    RA   Cancer (HCC)    skin cancer, colon, liver   Depression    Diabetes mellitus without complication (HCC)    Dyspnea    Fibromyalgia    GERD (gastroesophageal reflux disease)    GI bleed 08/12/2017   Heart murmur    ECHO scheduled 05-25-2015   Hyperlipidemia    Neuropathy    PONV (postoperative nausea and vomiting)    Puncture wound of great toe    Right leg DVT ([place] [facility]) 12/01/2020   Past Surgical History:  Procedure Laterality Date   ABDOMINAL HYSTERECTOMY     BIOPSY  09/23/2016   Procedure: BIOPSY;  Surgeon: Danie Binder, MD;  [facility];  Service: Endoscopy;;  hepatic flexure mass   BIOPSY  02/06/2022   Procedure: BIOPSY;  Surgeon: Daneil Dolin, MD;  [facility];  Service: Endoscopy;;   CHOLECYSTECTOMY     COLONOSCOPY N/A 08/13/2017   Procedure: COLONOSCOPY;  Surgeon: Daneil Dolin, MD;  [facility];  Service: Endoscopy;  Laterality: N/A;   COLONOSCOPY WITH PROPOFOL N/A 09/23/2016   Procedure: COLONOSCOPY WITH PROPOFOL;  Surgeon: Danie Binder, MD;  [facility];  Service: Endoscopy;  Laterality: N/A;  7:30 am  ESOPHAGEAL BANDING  02/06/2022   Procedure: ESOPHAGEAL BANDING;  Surgeon: Daneil Dolin, MD;  [facility];  Service: Endoscopy;;   ESOPHAGOGASTRODUODENOSCOPY (EGD) WITH PROPOFOL N/A 02/06/2022   Procedure: ESOPHAGOGASTRODUODENOSCOPY (EGD) WITH PROPOFOL;  Surgeon: Daneil Dolin, MD;  [facility];  Service: Endoscopy;  Laterality: N/A;  patient has 1030 appt at Dr.McKinney's office. Will come to hospital after appt.   HARDWARE REMOVAL Right 05/30/2015   Procedure: HARDWARE REMOVAL;  Surgeon: Ninetta Lights, MD;  Location: [facility];  Service: Orthopedics;  Laterality: Right;   IR ANGIOGRAM SELECTIVE EACH ADDITIONAL VESSEL  07/25/2019   IR ANGIOGRAM SELECTIVE EACH ADDITIONAL VESSEL  07/25/2019   IR ANGIOGRAM SELECTIVE EACH ADDITIONAL VESSEL  01/02/2021   IR ANGIOGRAM SELECTIVE EACH ADDITIONAL VESSEL  01/02/2021   IR ANGIOGRAM SELECTIVE EACH ADDITIONAL VESSEL  01/02/2021   IR ANGIOGRAM SELECTIVE EACH ADDITIONAL VESSEL  01/18/2021   IR ANGIOGRAM SELECTIVE EACH ADDITIONAL VESSEL  02/15/2021   IR ANGIOGRAM SELECTIVE EACH ADDITIONAL VESSEL  02/15/2021   IR ANGIOGRAM VISCERAL SELECTIVE  07/25/2019   IR ANGIOGRAM VISCERAL SELECTIVE  07/25/2019   IR ANGIOGRAM VISCERAL SELECTIVE  01/02/2021   IR ANGIOGRAM VISCERAL SELECTIVE  01/02/2021   IR ANGIOGRAM VISCERAL SELECTIVE  01/18/2021   IR ANGIOGRAM VISCERAL SELECTIVE  02/15/2021   IR EMBO ARTERIAL NOT HEMORR HEMANG INC GUIDE ROADMAPPING  01/02/2021   IR EMBO TUMOR ORGAN ISCHEMIA INFARCT INC GUIDE ROADMAPPING  07/25/2019   IR EMBO TUMOR ORGAN ISCHEMIA INFARCT INC GUIDE ROADMAPPING  01/18/2021   IR EMBO TUMOR ORGAN ISCHEMIA INFARCT INC GUIDE ROADMAPPING  02/15/2021   IR RADIOLOGIST EVAL & MGMT  06/09/2019   IR RADIOLOGIST EVAL & MGMT  07/07/2019   IR RADIOLOGIST EVAL & MGMT  01/05/2020   IR RADIOLOGIST EVAL & MGMT  04/12/2020   IR RADIOLOGIST EVAL & MGMT  09/27/2020   IR RADIOLOGIST EVAL & MGMT  10/31/2020   IR RADIOLOGIST EVAL & MGMT  12/12/2020   IR RADIOLOGIST EVAL & MGMT  04/04/2021   IR RADIOLOGIST EVAL & MGMT  05/23/2021   IR RADIOLOGIST EVAL & MGMT  12/17/2021   IR US GUIDE VASC ACCESS RIGHT  07/25/2019   IR US GUIDE VASC ACCESS RIGHT  01/02/2021   IR US GUIDE VASC ACCESS RIGHT  01/18/2021   IR US GUIDE VASC ACCESS RIGHT  02/15/2021   PARTIAL COLECTOMY N/A 10/10/2016   Procedure: PARTIAL COLECTOMY;  Surgeon: Aviva Signs, MD;  [facility];  Service: General;  Laterality: N/A;   planter warts Bilateral    both feet   POLYPECTOMY  09/23/2016   Procedure: POLYPECTOMY;   Surgeon: Danie Binder, MD;  [facility];  Service: Endoscopy;;  transverse colon polyps times 2, rectal polyp   PORTACATH PLACEMENT Left 01/08/2022   Procedure: INSERTION PORT-A-CATH;  Surgeon: Aviva Signs, MD;  Location[facility];  Service: General;  Laterality: Left;   skin grafts     due to planter warts   TOTAL KNEE ARTHROPLASTY Right 05/30/2015   Procedure: RIGHT TOTAL KNEE ARTHROPLASTY;  Surgeon: Ninetta Lights, MD;  Location: [facility];  Service: Orthopedics;  Laterality: Right;     Current Meds  Medication Sig   acetaminophen (TYLENOL) 500 MG tablet Take 500 mg by mouth every 6 (six) hours as needed for moderate pain.   Atezolizumab (TECENTRIQ IV) Inject into the vein every 21 ( twenty-one) days.   Bevacizumab (AVASTIN IV) Inject into the vein every 21 ( twenty-one)  days.   carvedilol (COREG) 6.25 MG tablet Take 6.25 mg by mouth 2 (two) times daily with a meal.   cholecalciferol (VITAMIN D3) 25 MCG (1000 UNIT) tablet Take 1,000 Units by mouth daily.   folic acid (FOLVITE) 1 MG tablet Take 1 mg by mouth daily.   furosemide (LASIX) 20 MG tablet Take 20 mg by mouth daily as needed for edema.   gabapentin (NEURONTIN) 600 MG tablet Take 600 mg by mouth 3 (three) times daily as needed (pain).   Glucagon (GVOKE HYPOPEN 2-PACK) 1 MG/0.2ML SOAJ Inject 0.2 mLs into the skin as needed (Blood glucose less than 55).   glucosamine-chondroitin 500-400 MG tablet Take 2 tablets by mouth daily.   insulin degludec (TRESIBA FLEXTOUCH) 100 UNIT/ML FlexTouch Pen Inject 40 Units into the skin at bedtime.   lactulose (CHRONULAC) 10 GM/15ML solution Take 30 mLs (20 g total) by mouth daily.   lidocaine-prilocaine (EMLA) cream Apply to affected area once   loperamide (IMODIUM) 2 MG capsule Take 2 mg by mouth as needed for diarrhea or loose stools.   magnesium oxide (MAG-OX) 400 (240 Mg) MG tablet Take 1 tablet (400 mg total) by mouth 3 (three) times daily.   methotrexate (RHEUMATREX) 2.5 MG tablet  Take 20 mg by mouth every Sunday. Caution:Chemotherapy. Protect from light.   NOVOLOG MIX 70/30 FLEXPEN (70-30) 100 UNIT/ML FlexPen Inject 20-30 Units into the skin See admin instructions. Inject 30 units into the skin in the morning and inject 20 units in the evening   omeprazole (PRILOSEC) 40 MG capsule Take 40 mg by mouth daily.    prochlorperazine (COMPAZINE) 10 MG tablet Take 1 tablet (10 mg total) by mouth every 6 (six) hours as needed for nausea or vomiting.   rifaximin (XIFAXAN) 550 MG TABS tablet Take 1 tablet (550 mg total) by mouth 2 (two) times daily.   rivaroxaban (XARELTO) 20 MG TABS tablet Take 1 tablet (20 mg total) by mouth daily with supper.   rOPINIRole (REQUIP) 1 MG tablet Take 1 mg by mouth 3 (three) times daily as needed (restless leg).     Allergies:   Xeloda [capecitabine], Adhesive [tape], Neosporin original [bacitracin-neomycin-polymyxin], Sulfa antibiotics, and Erythromycin   ROS:   Please see the history of present illness.     All other systems reviewed and are negative.   Labs/Other Tests and Data Reviewed:    Recent Labs: 12/26/2021: TSH 0.092 02/09/2022: B Natriuretic Peptide 329.0 02/12/2022: Magnesium 1.6 02/13/2022: ALT 18 02/14/2022: BUN 14; Creatinine, Ser 0.69; Hemoglobin 8.5; Platelets 98; Potassium 3.4; Sodium 139   Recent Lipid Panel Lab Results  Component Value Date/Time   TRIG 61 02/12/2022 04:44 AM    Wt Readings from Last 3 Encounters:  03/04/22 215 lb 9.6 oz (97.8 kg)  02/14/22 214 lb 4.6 oz (97.2 kg)  02/04/22 223 lb 14.4 oz (101.6 kg)     ASSESSMENT & PLAN:    Fall, initial encounter No major bruising or localized pain Has generalized soreness, has h/o RA Tramadol as needed for pain Continue PT for strength training  Leg swelling Was recently since starting chemotherapy Continue Lasix as needed Leg elevation and compression socks   Time:   Today, I have spent 13 minutes reviewing the chart, including problem list,  medications, and with the patient with telehealth technology discussing the above problems.   Medication Adjustments/Labs and Tests Ordered: Current medicines are reviewed at length with the patient today.  Concerns regarding medicines are outlined above.   Tests Ordered: No  orders of the defined types were placed in this encounter.   Medication Changes: No orders of the defined types were placed in this encounter.    Note: This dictation was prepared with Dragon dictation along with smaller phrase technology. Similar sounding words can be transcribed inadequately or may not be corrected upon review. Any transcriptional errors that result from this process are unintentional.      Disposition:  Follow up  Signed, Lindell Spar, MD  03/11/2022 11:55 AM     [address]

## 2022-03-12 ENCOUNTER — Ambulatory Visit: Payer: Medicare Other | Admitting: Internal Medicine

## 2022-03-13 ENCOUNTER — Inpatient Hospital Stay (HOSPITAL_COMMUNITY): Payer: Medicare Other

## 2022-03-13 ENCOUNTER — Inpatient Hospital Stay (HOSPITAL_COMMUNITY): Payer: Medicare Other | Admitting: Hematology

## 2022-03-13 VITALS — BP 140/52 | HR 80 | Temp 98.6°F | Resp 16

## 2022-03-13 DIAGNOSIS — D649 Anemia, unspecified: Secondary | ICD-10-CM

## 2022-03-13 DIAGNOSIS — R1310 Dysphagia, unspecified: Secondary | ICD-10-CM

## 2022-03-13 DIAGNOSIS — N183 Chronic kidney disease, stage 3 unspecified: Secondary | ICD-10-CM

## 2022-03-13 DIAGNOSIS — Z5112 Encounter for antineoplastic immunotherapy: Secondary | ICD-10-CM | POA: Diagnosis not present

## 2022-03-13 DIAGNOSIS — D630 Anemia in neoplastic disease: Secondary | ICD-10-CM | POA: Diagnosis not present

## 2022-03-13 DIAGNOSIS — C220 Liver cell carcinoma: Secondary | ICD-10-CM

## 2022-03-13 DIAGNOSIS — E8809 Other disorders of plasma-protein metabolism, not elsewhere classified: Secondary | ICD-10-CM

## 2022-03-13 DIAGNOSIS — K5791 Diverticulosis of intestine, part unspecified, without perforation or abscess with bleeding: Secondary | ICD-10-CM

## 2022-03-13 LAB — COMPREHENSIVE METABOLIC PANEL
ALT: 26 U/L (ref 0–44)
AST: 31 U/L (ref 15–41)
Albumin: 2.4 g/dL — ABNORMAL LOW (ref 3.5–5.0)
Alkaline Phosphatase: 70 U/L (ref 38–126)
Anion gap: 6 (ref 5–15)
BUN: 14 mg/dL (ref 8–23)
CO2: 27 mmol/L (ref 22–32)
Calcium: 8.4 mg/dL — ABNORMAL LOW (ref 8.9–10.3)
Chloride: 104 mmol/L (ref 98–111)
Creatinine, Ser: 0.8 mg/dL (ref 0.44–1.00)
GFR, Estimated: >60 mL/min (ref >60)
Glucose, Bld: 80 mg/dL (ref 70–99)
Potassium: 3.6 mmol/L (ref 3.5–5.1)
Sodium: 137 mmol/L (ref 135–145)
Total Bilirubin: 0.7 mg/dL (ref 0.3–1.2)
Total Protein: 5.6 g/dL — ABNORMAL LOW (ref 6.5–8.1)

## 2022-03-13 LAB — CBC WITH DIFFERENTIAL/PLATELET
Abs Immature Granulocytes: 0.01 10*3/uL (ref 0.00–0.07)
Basophils Absolute: 0 10*3/uL (ref 0.0–0.1)
Basophils Relative: 0 %
Eosinophils Absolute: 0.2 10*3/uL (ref 0.0–0.5)
Eosinophils Relative: 4 %
HCT: 24.5 % — ABNORMAL LOW (ref 36.0–46.0)
Hemoglobin: 7.6 g/dL — ABNORMAL LOW (ref 12.0–15.0)
Immature Granulocytes: 0 %
Lymphocytes Relative: 22 %
Lymphs Abs: 1 10*3/uL (ref 0.7–4.0)
MCH: 29.1 pg (ref 26.0–34.0)
MCHC: 31 g/dL (ref 30.0–36.0)
MCV: 93.9 fL (ref 80.0–100.0)
Monocytes Absolute: 0.3 10*3/uL (ref 0.1–1.0)
Monocytes Relative: 7 %
Neutro Abs: 3.2 10*3/uL (ref 1.7–7.7)
Neutrophils Relative %: 67 %
Platelets: 163 10*3/uL (ref 150–400)
RBC: 2.61 MIL/uL — ABNORMAL LOW (ref 3.87–5.11)
RDW: 19.7 % — ABNORMAL HIGH (ref 11.5–15.5)
WBC: 4.8 10*3/uL (ref 4.0–10.5)
nRBC: 0 % (ref 0.0–0.2)

## 2022-03-13 LAB — PREPARE RBC (CROSSMATCH)

## 2022-03-13 LAB — MAGNESIUM: Magnesium: 1.6 mg/dL — ABNORMAL LOW (ref 1.7–2.4)

## 2022-03-13 LAB — TSH: TSH: 0.623 u[IU]/mL (ref 0.350–4.500)

## 2022-03-13 MED ORDER — SODIUM CHLORIDE 0.9 % IV SOLN
15.0000 mg/kg | Freq: Once | INTRAVENOUS | Status: DC
  Filled 2022-03-13: qty 60

## 2022-03-13 MED ORDER — SODIUM CHLORIDE 0.9 % IV SOLN
300.0000 mg | Freq: Once | INTRAVENOUS | Status: AC
  Administered 2022-03-13: 300 mg via INTRAVENOUS
  Filled 2022-03-13: qty 300

## 2022-03-13 MED ORDER — SCOPOLAMINE 1 MG/3DAYS TD PT72
1.0000 | MEDICATED_PATCH | TRANSDERMAL | Status: DC
Start: 2022-03-13 — End: 2022-03-13
  Administered 2022-03-13: 1.5 mg via TRANSDERMAL

## 2022-03-13 MED ORDER — FAMOTIDINE IN NACL 20-0.9 MG/50ML-% IV SOLN
20.0000 mg | Freq: Once | INTRAVENOUS | Status: AC | PRN
  Administered 2022-03-13: 20 mg via INTRAVENOUS

## 2022-03-13 MED ORDER — ONDANSETRON 8 MG PO TBDP: 8.0000 mg | ORAL_TABLET | Freq: Three times a day (TID) | ORAL | 0 refills | Status: DC | PRN

## 2022-03-13 MED ORDER — SODIUM CHLORIDE 0.9 % IV SOLN: Freq: Once | INTRAVENOUS | Status: DC

## 2022-03-13 MED ORDER — LORATADINE 10 MG PO TABS
10.0000 mg | ORAL_TABLET | Freq: Once | ORAL | Status: AC
  Administered 2022-03-13: 10 mg via ORAL
  Filled 2022-03-13: qty 1

## 2022-03-13 MED ORDER — ALBUMIN HUMAN 25 % IV SOLN
50.0000 g | Freq: Once | INTRAVENOUS | Status: AC
  Administered 2022-03-13: 50 g via INTRAVENOUS
  Filled 2022-03-13: qty 200

## 2022-03-13 MED ORDER — SODIUM CHLORIDE 0.9 % IV SOLN
1200.0000 mg | Freq: Once | INTRAVENOUS | Status: DC
  Filled 2022-03-13: qty 20

## 2022-03-13 MED ORDER — FUROSEMIDE 40 MG PO TABS
40.0000 mg | ORAL_TABLET | Freq: Every day | ORAL | 3 refills | Status: DC | PRN
Start: 2022-03-13 — End: 2023-09-24

## 2022-03-13 MED ORDER — ACETAMINOPHEN 325 MG PO TABS
650.0000 mg | ORAL_TABLET | Freq: Once | ORAL | Status: AC
  Administered 2022-03-13: 650 mg via ORAL
  Filled 2022-03-13: qty 2

## 2022-03-13 MED ORDER — ONDANSETRON HCL 4 MG/2ML IJ SOLN
4.0000 mg | Freq: Once | INTRAMUSCULAR | Status: AC
Start: 2022-03-13 — End: 2022-03-13
  Administered 2022-03-13: 4 mg via INTRAVENOUS

## 2022-03-13 MED ORDER — DIPHENHYDRAMINE HCL 50 MG/ML IJ SOLN
25.0000 mg | Freq: Once | INTRAMUSCULAR | Status: AC
  Administered 2022-03-13: 25 mg via INTRAVENOUS

## 2022-03-13 MED ORDER — METHYLPREDNISOLONE SODIUM SUCC 125 MG IJ SOLR: 125.0000 mg | Freq: Once | INTRAMUSCULAR | Status: DC | PRN

## 2022-03-13 MED ORDER — HEPARIN SOD (PORK) LOCK FLUSH 100 UNIT/ML IV SOLN
500.0000 [IU] | Freq: Once | INTRAVENOUS | Status: AC | PRN
  Administered 2022-03-13: 500 [IU]

## 2022-03-13 MED ORDER — SODIUM CHLORIDE 0.9 % IV SOLN: Freq: Once | INTRAVENOUS | Status: AC

## 2022-03-13 MED ORDER — SODIUM CHLORIDE 0.9% FLUSH
10.0000 mL | INTRAVENOUS | Status: DC | PRN
  Administered 2022-03-13: 10 mL

## 2022-03-13 NOTE — Patient Instructions (Signed)
[facility]  Discharge Instructions: Thank you for choosing [facility] to provide your oncology and hematology care.  If you have a lab appointment with the Cancer Center, please come in thru the Main Entrance and check in at the main information desk.  Wear comfortable clothing and clothing appropriate for easy access to any Portacath or PICC line.   We strive to give you quality time with your provider. You may need to reschedule your appointment if you arrive late (15 or more minutes).  Arriving late affects you and other patients whose appointments are after yours.  Also, if you miss three or more appointments without notifying the office, you may be dismissed from the clinic at the provider's discretion.      For prescription refill requests, have your pharmacy contact our office and allow 72 hours for refills to be completed.         To help prevent nausea and vomiting after your treatment, we encourage you to take your nausea medication as directed.  BELOW ARE SYMPTOMS THAT SHOULD BE REPORTED IMMEDIATELY: *FEVER GREATER THAN 100.4 F (38 C) OR HIGHER *CHILLS OR SWEATING *NAUSEA AND VOMITING THAT IS NOT CONTROLLED WITH YOUR NAUSEA MEDICATION *UNUSUAL SHORTNESS OF BREATH *UNUSUAL BRUISING OR BLEEDING *URINARY PROBLEMS (pain or burning when urinating, or frequent urination) *BOWEL PROBLEMS (unusual diarrhea, constipation, pain near the anus) TENDERNESS IN MOUTH AND THROAT WITH OR WITHOUT PRESENCE OF ULCERS (sore throat, sores in mouth, or a toothache) UNUSUAL RASH, SWELLING OR PAIN  UNUSUAL VAGINAL DISCHARGE OR ITCHING   Items with * indicate a potential emergency and should be followed up as soon as possible or go to the Emergency Department if any problems should occur.  Please show the CHEMOTHERAPY ALERT CARD or IMMUNOTHERAPY ALERT CARD at check-in to the Emergency Department and triage nurse.  Should you have questions after your visit or need to  cancel or reschedule your appointment, please contact [facility] [phone]  and follow the prompts.  Office hours are 8:00 a.m. to 4:30 p.m. Monday - Friday. Please note that voicemails left after 4:00 p.m. may not be returned until the following business day.  We are closed weekends and major holidays. You have access to a nurse at all times for urgent questions. Please call the main number to the clinic [phone] and follow the prompts.  For any non-urgent questions, you may also contact your provider using MyChart. We now offer e-Visits for anyone 18 and older to request care online for non-urgent symptoms. For details visit [email or web address].   Also download the MyChart app! Go to the app store, search "MyChart", open the app, select [facility], and log in with your MyChart username and password.  Masks are optional in the cancer centers. If you would like for your care team to wear a mask while they are taking care of you, please let them know. For doctor visits, patients may have with them one support person who is at least 78 years old. At this time, visitors are not allowed in the infusion area.  

## 2022-03-13 NOTE — Progress Notes (Signed)
[place], [place] [address]   CLINIC:  Medical Oncology/Hematology  PCP:  Lindell Spar, MD [address] / [place] [address] [phone]   REASON FOR VISIT:  Follow-up for right colon cancer, hepatocellular carcinoma and iron deficiency state  PRIOR THERAPY:  1. Right hemicolectomy on 10/10/2016. 2. Intermittent Feraheme last on 09/22/2018. 3. Bland embolization of hepatic lesion on 07/25/2019.  NGS Results: not done  CURRENT THERAPY: Atezolizumab and bevacizumab  BRIEF ONCOLOGIC HISTORY:  Oncology History  Colon cancer ([place])  10/10/2016 Initial Diagnosis   Colon cancer ([place])   10/10/2016 Surgery   Partial colectomy by Dr. Arnoldo Morale  Pathology shows  a 2.1 cm grade 2 adenocarcinoma of the ascending colon with invasion through the muscularis propria into the peri-colorectal tissues.      11/17/2016 PET scan   The hepatic lesion seen on the CT scan and MRI does not demonstrate hypermetabolism and is likely a benign entity.   Right maxillary sinus disease with associated hypermetabolism.   Scattered pulmonary nodules, likely benign. No follow-up needed if patient is low-risk (and has no known or suspected primary neoplasm). Non-contrast chest CT can be considered in 12 months if patient is high-risk.    12/25/2016 - 02/04/2017 Adjuvant Chemotherapy   Xeloda '2000mg'$  PO BID take for 14 days out of 21 days. Plan for total of 24 weeks of treatment.   Hepatocellular carcinoma ([place])  05/17/2019 Initial Diagnosis   Hepatocellular carcinoma ([place])   12/26/2021 -  Chemotherapy   Patient is on Treatment Plan : [place] Atezolizumab + Bevacizumab q21d Maintenance       CANCER STAGING:  Cancer Staging  Colon cancer [facility]) Staging form: Colon and Rectum, AJCC 8th Edition - Clinical: Stage IIA (cT3, cN0, cM0) - Signed by [name], MD on 11/26/2016   INTERVAL HISTORY:  Ms. SHAUNTEL PREST, a 76 y.o. female, returns for routine  follow-up and consideration for next cycle of chemotherapy. Zacari was last seen on 01/30/2022.  Due for cycle #3 of Atezolizumab and bevacizumab today.   Overall, she tells me she has been feeling pretty well. She reports swelling in her feet and legs. She has 2 falls last week both due to weakness in her legs while trying to use the bathroom. Her sleep is poor. She reports nausea and vomiting since her last treatment; her last episode of vomiting has this morning. She continues to have trouble swallowing which leads to regurgitation after eating solid foods. She is able to swallow liquids. She denies nosebleeds, hematochezia, hematuria, black stools, and constipation. She has lost 6 lbs since her last visit. She is drinking 4 Boost or Ensure daily.   Overall, she feels ready for next cycle of chemo today.    REVIEW OF SYSTEMS:  Review of Systems  Constitutional:  Positive for appetite change, fatigue and unexpected weight change (-6 lbs).  HENT:   Positive for trouble swallowing. Negative for nosebleeds.   Cardiovascular:  Positive for chest pain (5/10) and leg swelling (feet and legs).  Gastrointestinal:  Positive for nausea and vomiting. Negative for blood in stool and constipation.  Genitourinary:  Negative for hematuria.   Neurological:  Positive for extremity weakness (legs) and numbness (feet).  Psychiatric/Behavioral:  Positive for sleep disturbance.   All other systems reviewed and are negative.   PAST MEDICAL/SURGICAL HISTORY:  Past Medical History:  Diagnosis Date   Acute pulmonary embolism ([place]) 04/15/2017   Anemia    Arthritis  RA   Cancer (HCC)    skin cancer, colon, liver   Depression    Diabetes mellitus without complication (HCC)    Dyspnea    Fibromyalgia    GERD (gastroesophageal reflux disease)    GI bleed 08/12/2017   Heart murmur    ECHO scheduled 05-25-2015   Hyperlipidemia    Neuropathy    PONV (postoperative nausea and vomiting)    Puncture wound  of great toe    Right leg DVT ([place]) 12/01/2020   Past Surgical History:  Procedure Laterality Date   ABDOMINAL HYSTERECTOMY     BIOPSY  09/23/2016   Procedure: BIOPSY;  Surgeon: Danie Binder, MD;  [facility];  Service: Endoscopy;;  hepatic flexure mass   BIOPSY  02/06/2022   Procedure: BIOPSY;  Surgeon: Daneil Dolin, MD;  [facility];  Service: Endoscopy;;   CHOLECYSTECTOMY     COLONOSCOPY N/A 08/13/2017   Procedure: COLONOSCOPY;  Surgeon: Daneil Dolin, MD;  [facility];  Service: Endoscopy;  Laterality: N/A;   COLONOSCOPY WITH PROPOFOL N/A 09/23/2016   Procedure: COLONOSCOPY WITH PROPOFOL;  Surgeon: Danie Binder, MD;  [facility];  Service: Endoscopy;  Laterality: N/A;  7:30 am   ESOPHAGEAL BANDING  02/06/2022   Procedure: ESOPHAGEAL BANDING;  Surgeon: Daneil Dolin, MD;  [facility];  Service: Endoscopy;;   ESOPHAGOGASTRODUODENOSCOPY (EGD) WITH PROPOFOL N/A 02/06/2022   Procedure: ESOPHAGOGASTRODUODENOSCOPY (EGD) WITH PROPOFOL;  Surgeon: Daneil Dolin, MD;  [facility];  Service: Endoscopy;  Laterality: N/A;  patient has 1030 appt at Dr.McKinney's office. Will come to hospital after appt.   HARDWARE REMOVAL Right 05/30/2015   Procedure: HARDWARE REMOVAL;  Surgeon: Ninetta Lights, MD;  Location: [facility];  Service: Orthopedics;  Laterality: Right;   IR ANGIOGRAM SELECTIVE EACH ADDITIONAL VESSEL  07/25/2019   IR ANGIOGRAM SELECTIVE EACH ADDITIONAL VESSEL  07/25/2019   IR ANGIOGRAM SELECTIVE EACH ADDITIONAL VESSEL  01/02/2021   IR ANGIOGRAM SELECTIVE EACH ADDITIONAL VESSEL  01/02/2021   IR ANGIOGRAM SELECTIVE EACH ADDITIONAL VESSEL  01/02/2021   IR ANGIOGRAM SELECTIVE EACH ADDITIONAL VESSEL  01/18/2021   IR ANGIOGRAM SELECTIVE EACH ADDITIONAL VESSEL  02/15/2021   IR ANGIOGRAM SELECTIVE EACH ADDITIONAL VESSEL  02/15/2021   IR ANGIOGRAM VISCERAL SELECTIVE  07/25/2019   IR ANGIOGRAM VISCERAL SELECTIVE  07/25/2019   IR  ANGIOGRAM VISCERAL SELECTIVE  01/02/2021   IR ANGIOGRAM VISCERAL SELECTIVE  01/02/2021   IR ANGIOGRAM VISCERAL SELECTIVE  01/18/2021   IR ANGIOGRAM VISCERAL SELECTIVE  02/15/2021   IR EMBO ARTERIAL NOT HEMORR HEMANG INC GUIDE ROADMAPPING  01/02/2021   IR EMBO TUMOR ORGAN ISCHEMIA INFARCT INC GUIDE ROADMAPPING  07/25/2019   IR EMBO TUMOR ORGAN ISCHEMIA INFARCT INC GUIDE ROADMAPPING  01/18/2021   IR EMBO TUMOR ORGAN ISCHEMIA INFARCT INC GUIDE ROADMAPPING  02/15/2021   IR RADIOLOGIST EVAL & MGMT  06/09/2019   IR RADIOLOGIST EVAL & MGMT  07/07/2019   IR RADIOLOGIST EVAL & MGMT  01/05/2020   IR RADIOLOGIST EVAL & MGMT  04/12/2020   IR RADIOLOGIST EVAL & MGMT  09/27/2020   IR RADIOLOGIST EVAL & MGMT  10/31/2020   IR RADIOLOGIST EVAL & MGMT  12/12/2020   IR RADIOLOGIST EVAL & MGMT  04/04/2021   IR RADIOLOGIST EVAL & MGMT  05/23/2021   IR RADIOLOGIST EVAL & MGMT  12/17/2021   IR US GUIDE VASC ACCESS RIGHT  07/25/2019   IR US GUIDE VASC  ACCESS RIGHT  01/02/2021   IR US GUIDE VASC ACCESS RIGHT  01/18/2021   IR US GUIDE VASC ACCESS RIGHT  02/15/2021   PARTIAL COLECTOMY N/A 10/10/2016   Procedure: PARTIAL COLECTOMY;  Surgeon: Aviva Signs, MD;  Location[facility];  Service: General;  Laterality: N/A;   planter warts Bilateral    both feet   POLYPECTOMY  09/23/2016   Procedure: POLYPECTOMY;  Surgeon: Danie Binder, MD;  [facility];  Service: Endoscopy;;  transverse colon polyps times 2, rectal polyp   PORTACATH PLACEMENT Left 01/08/2022   Procedure: INSERTION PORT-A-CATH;  Surgeon: Aviva Signs, MD;  Location[facility];  Service: General;  Laterality: Left;   skin grafts     due to planter warts   TOTAL KNEE ARTHROPLASTY Right 05/30/2015   Procedure: RIGHT TOTAL KNEE ARTHROPLASTY;  Surgeon: Ninetta Lights, MD;  Location: [facility];  Service: Orthopedics;  Laterality: Right;    SOCIAL HISTORY:  Social History   Socioeconomic History   Marital status: Widowed    Spouse name: Not on file   Number of children: Not  on file   Years of education: Not on file   Highest education level: Not on file  Occupational History   Not on file  Tobacco Use   Smoking status: Former    Packs/day: 1.00    Years: 40.00    Total pack years: 40.00    Types: Cigarettes    Quit date: 08/18/2004    Years since quitting: 17.5   Smokeless tobacco: Never  Vaping Use   Vaping Use: Never used  Substance and Sexual Activity   Alcohol use: No   Drug use: No   Sexual activity: Not Currently    Birth control/protection: Surgical  Other Topics Concern   Not on file  Social History Narrative   Not on file   Social Determinants of Health   Financial Resource Strain: Low Risk  (01/29/2022)   Overall Financial Resource Strain (CARDIA)    Difficulty of Paying Living Expenses: Not hard at all  Food Insecurity: No Food Insecurity (01/29/2022)   Hunger Vital Sign    Worried About Running Out of Food in the Last Year: Never true    [place] in the Last Year: Never true  Transportation Needs: No Transportation Needs (01/29/2022)   PRAPARE - Hydrologist (Medical): No    Lack of Transportation (Non-Medical): No  Physical Activity: Insufficiently Active (01/29/2022)   Exercise Vital Sign    Days of Exercise per Week: 7 days    Minutes of Exercise per Session: 20 min  Stress: No Stress Concern Present (01/29/2022)   [place]    Feeling of Stress : Not at all  Social Connections: Moderately Isolated (01/29/2022)   Social Connection and Isolation Panel [NHANES]    Frequency of Communication with Friends and Family: More than three times a week    Frequency of Social Gatherings with Friends and Family: More than three times a week    Attends Religious Services: 1 to 4 times per year    Active Member of [employer] or Organizations: No    Attends Archivist Meetings: Never    Marital Status: Widowed  Intimate Partner  Violence: Not At Risk (01/29/2022)   Humiliation, Afraid, Rape, and Kick questionnaire    Fear of Current or Ex-Partner: No    Emotionally Abused: No    Physically  Abused: No    Sexually Abused: No    FAMILY HISTORY:  Family History  Problem Relation Age of Onset   Breast cancer Mother    Breast cancer Daughter    Colon cancer Neg Hx     CURRENT MEDICATIONS:  Current Outpatient Medications  Medication Sig Dispense Refill   acetaminophen (TYLENOL) 500 MG tablet Take 500 mg by mouth every 6 (six) hours as needed for moderate pain.     Atezolizumab (TECENTRIQ IV) Inject into the vein every 21 ( twenty-one) days.     Bevacizumab (AVASTIN IV) Inject into the vein every 21 ( twenty-one) days.     carvedilol (COREG) 6.25 MG tablet Take 6.25 mg by mouth 2 (two) times daily with a meal.     cholecalciferol (VITAMIN D3) 25 MCG (1000 UNIT) tablet Take 1,000 Units by mouth daily.     folic acid (FOLVITE) 1 MG tablet Take 1 mg by mouth daily.     furosemide (LASIX) 20 MG tablet Take 20 mg by mouth daily as needed for edema.     gabapentin (NEURONTIN) 600 MG tablet Take 600 mg by mouth 3 (three) times daily as needed (pain).     Glucagon (GVOKE HYPOPEN 2-PACK) 1 MG/0.2ML SOAJ Inject 0.2 mLs into the skin as needed (Blood glucose less than 55). 0.4 mL 11   glucosamine-chondroitin 500-400 MG tablet Take 2 tablets by mouth daily.     insulin degludec (TRESIBA FLEXTOUCH) 100 UNIT/ML FlexTouch Pen Inject 40 Units into the skin at bedtime. 36 mL 0   lactulose (CHRONULAC) 10 GM/15ML solution Take 30 mLs (20 g total) by mouth daily. 236 mL 0   lidocaine-prilocaine (EMLA) cream Apply to affected area once 30 g 3   loperamide (IMODIUM) 2 MG capsule Take 2 mg by mouth as needed for diarrhea or loose stools.     magnesium oxide (MAG-OX) 400 (240 Mg) MG tablet Take 1 tablet (400 mg total) by mouth 3 (three) times daily. 90 tablet 3   methotrexate (RHEUMATREX) 2.5 MG tablet Take 20 mg by mouth every Sunday.  Caution:Chemotherapy. Protect from light.     NOVOLOG MIX 70/30 FLEXPEN (70-30) 100 UNIT/ML FlexPen Inject 20-30 Units into the skin See admin instructions. Inject 30 units into the skin in the morning and inject 20 units in the evening  3   omeprazole (PRILOSEC) 40 MG capsule Take 40 mg by mouth daily.   3   prochlorperazine (COMPAZINE) 10 MG tablet Take 1 tablet (10 mg total) by mouth every 6 (six) hours as needed for nausea or vomiting. 30 tablet 3   rifaximin (XIFAXAN) 550 MG TABS tablet Take 1 tablet (550 mg total) by mouth 2 (two) times daily. 56 tablet 0   rivaroxaban (XARELTO) 20 MG TABS tablet Take 1 tablet (20 mg total) by mouth daily with supper. 30 tablet 6   rOPINIRole (REQUIP) 1 MG tablet Take 1 mg by mouth 3 (three) times daily as needed (restless leg).     traMADol (ULTRAM) 50 MG tablet Take 1 tablet (50 mg total) by mouth every 8 (eight) hours as needed for up to 5 days. 15 tablet 0   No current facility-administered medications for this visit.   Facility-Administered Medications Ordered in Other Visits  Medication Dose Route Frequency Provider Last Rate Last Admin   0.9 %  sodium chloride infusion   Intravenous Continuous Holley Bouche, NP   Stopped at 09/14/17 1440    ALLERGIES:  Allergies  Allergen Reactions  Xeloda [Capecitabine] Other (See Comments)    Blisters and pain to skin to include feet and arms   Adhesive [Tape]     Adhesive tape and band-aids cause skin irritation   Neosporin Original [Bacitracin-Neomycin-Polymyxin] Other (See Comments)    blister   Sulfa Antibiotics Nausea Only and Other (See Comments)    Joint paint   Erythromycin Itching and Rash    burning    PHYSICAL EXAM:  Performance status (ECOG): 2 - Symptomatic, <50% confined to bed  There were no vitals filed for this visit. Wt Readings from Last 3 Encounters:  03/04/22 215 lb 9.6 oz (97.8 kg)  02/14/22 214 lb 4.6 oz (97.2 kg)  02/04/22 223 lb 14.4 oz (101.6 kg)   Physical  Exam Vitals reviewed.  Constitutional:      Appearance: Normal appearance. She is obese.  Cardiovascular:     Rate and Rhythm: Normal rate and regular rhythm.     Pulses: Normal pulses.     Heart sounds: Normal heart sounds.  Pulmonary:     Effort: Pulmonary effort is normal.     Breath sounds: Normal breath sounds.  Neurological:     General: No focal deficit present.     Mental Status: She is alert and oriented to person, place, and time.  Psychiatric:        Mood and Affect: Mood normal.        Behavior: Behavior normal.     LABORATORY DATA:  I have reviewed the labs as listed.     Latest Ref Rng & Units 02/14/2022    4:13 AM 02/13/2022    4:34 AM 02/12/2022    4:44 AM  CBC  WBC 4.0 - 10.5 K/uL 6.0  4.9  4.3   Hemoglobin 12.0 - 15.0 g/dL 8.5  8.1  7.9   Hematocrit 36.0 - 46.0 % 27.5  26.8  25.6   Platelets 150 - 400 K/uL 98  98  102       Latest Ref Rng & Units 02/14/2022    4:13 AM 02/13/2022    4:34 AM 02/12/2022    4:44 AM  CMP  Glucose 70 - 99 mg/dL 191  179  187   BUN 8 - 23 mg/dL '14  16  19   '$ Creatinine 0.44 - 1.00 mg/dL 0.69  0.65  0.69   Sodium 135 - 145 mmol/L 139  141  144   Potassium 3.5 - 5.1 mmol/L 3.4  3.6  3.3   Chloride 98 - 111 mmol/L 105  107  109   CO2 22 - 32 mmol/L '30  30  29   '$ Calcium 8.9 - 10.3 mg/dL 8.2  8.1  8.2   Total Protein 6.5 - 8.1 g/dL  4.8  4.9   Total Bilirubin 0.3 - 1.2 mg/dL  1.0  1.4   Alkaline Phos 38 - 126 U/L  43  41   AST 15 - 41 U/L  21  20   ALT 0 - 44 U/L  18  17     DIAGNOSTIC IMAGING:  I have independently reviewed the scans and discussed with the patient. [place] EKG SITE RITE  Result Date: 02/11/2022 If Site Rite image not attached, placement could not be confirmed due to current cardiac rhythm.    ASSESSMENT:  1.  Hepatocellular carcinoma: -MRI of the abdomen with Eovist on 04/21/2019 showed hypoenhancing lesion in the liver, which was not typical for [facility].  It was not significantly hypermetabolic on prior PET/CT.  This lesion has been gradually increasing since 10/08/2016. - AFP was normal at 3.9. -Biopsy on 05/11/2019 shows well to moderately differentiated hepatocellular carcinoma, steatohepatitic variant. -CT chest on 05/30/2019 was negative for metastatic disease. -Bland embolization of the hepatic lesion on 07/25/2019. -MRI of the abdomen with and without contrast on 09/21/2020 showed previous treated lesion in the central aspect of the liver measures 4.4 x 3.2 cm, previously 3.1 x 2.6 cm.  Hypervascular left liver lobe lesion measuring 1.3 x 1.1 cm.  6 mm hypervascular focus noted in the inferior aspect of the right lobe of the liver. -CTAP with and without contrast on 10/25/2020 showed left liver lesion measuring 1.8 x 1.4 cm, for inferior right liver lesion measuring 1 x 0.9 cm which are new.  Recurrent hyperenhancing tumor at the treated tumor site in the central liver, measuring 6 x 5 cm with extension into the caudate lobe.  No evidence of metastatic disease in the chest. - Left lobe liver biopsy on 11/29/2020-well to moderately differentiated [place]. - MRI (12/12/2021): Several new arterial phase enhancing lesions concerning for progressive malignancy. - Cycle 1 of Atezolizumab on 12/26/2021.   2.  Stage IIa (CT3CN0) colon cancer: -Right hemicolectomy on 10/10/2016, grade 2 adenocarcinoma, positive LVSI, margins negative, 0/19 lymph nodes positive. -Because of positive LVSI, she was recommended Xeloda.  She could not tolerate more than 2 cycles.  She developed severe hand-foot skin reaction. -PET scan on 02/23/2018- for metastatic disease.  Liver mass was also not seen. -CEA on 03/29/2019 was 4.5. -CT [facility] on 03/29/2019 showed mass of the liver measuring 5.2 cm, previously 4.8 cm.   3.  Diarrhea: -She has watery diarrhea up to once per day.  She takes Imodium 1-2 tablets/day which helps.  She had this diarrhea since surgery.   4.  Unprovoked PE: -Incidental PE on CT chest in August 2018.  She was treated with  Xarelto 20 mg for a year. -Dose was reduced to 10 mg daily in October 2019.  She is tolerating it without any problems.   PLAN:  1.  Liver confined hepatocellular carcinoma: - She has completed 2 cycles of Atezolizumab. - She underwent EGD and banding on 02/06/2022. - Subsequently hospitalized with intractable nausea and vomiting. - She lost about 6 pounds since 01/30/2022.  She complains of regurgitation of solid foods as food gets stuck behind retrosternally.  Recommend swallow evaluation. - Recommend increasing Ensure to 5 cans/day.  She is currently taking 4 cans. - Recommend proceeding with cycle 3 today by adding bevacizumab. - Recommend giving 1 unit of blood transfusion.   2.  Stage IIa (CT3CN0) colon cancer: - Last CEA was 4.7.  Last CT scan did not show any evidence of metastatic disease.   3.  Unprovoked PE: - Continue Xarelto 20 mg daily.   4.  Normocytic anemia: - Hemoglobin is 7.6 today.  She will receive her second infusion Venofer today.  5.  Hypomagnesemia: - Continue magnesium 3 times daily.  6.  Lower extremity swelling: - She has 3+ edema in the legs. - Albumin is low at 2.4. - Recommend albumin 50 g IV. - Recommend Lasix 40 mg daily.  She is taking 20 mg daily.  Addendum: - She has received Venofer.  After that when she was receiving third bottle of albumin, she started developing itching and tongue feeling thick.  Infusion was stopped.  She was given Benadryl, Pepcid and Zofran.  She had clear secretions from the mouth multiple times.  She is  also hyper salivating.  Scopolamine patch was applied.  I have seen her again when hypersecretion of saliva has stopped.  We have discontinued albumin. - We will hold her treatment until tomorrow.   Orders placed this encounter:  No orders of the defined types were placed in this encounter.    Derek Jack, MD [place] [phone]   I, Thana Ates, am acting as a scribe for Dr. Derek Jack.  I, Derek Jack MD, have reviewed the above documentation for accuracy and completeness, and I agree with the above.

## 2022-03-13 NOTE — Patient Instructions (Addendum)
[place] at [facility] Discharge Instructions   You were seen and examined today by Dr. Delton Coombes.  Your lab work from today is pending.   We sent a prescription for you for nausea called Zofran. It will dissolve under your tongue so you don't have to swallow. You can use 1 pill every 8 hours as needed. We also sent a prescription for Lasix (fluid pill). It is an increased dose (40 mg). Use daily in the morning as needed for leg swelling.   We will proceed with your treatment today as long as lab work looks good.   Return as scheduled.    Thank you for choosing [place] at [facility] to provide your oncology and hematology care.  To afford each patient quality time with our provider, please arrive at least 15 minutes before your scheduled appointment time.   If you have a lab appointment with the [facility] please come in thru the Main Entrance and check in at the main information desk.  You need to re-schedule your appointment should you arrive 10 or more minutes late.  We strive to give you quality time with our providers, and arriving late affects you and other patients whose appointments are after yours.  Also, if you no show three or more times for appointments you may be dismissed from the clinic at the providers discretion.     Again, thank you for choosing [facility].  Our hope is that these requests will decrease the amount of time that you wait before being seen by our physicians.       _____________________________________________________________  Should you have questions after your visit to [facility], please contact our office at [phone] and follow the prompts.  Our office hours are 8:00 a.m. and 4:30 p.m. Monday - Friday.  Please note that voicemails left after 4:00 p.m. may not be returned until the following business day.  We are closed weekends and major holidays.  You do have access to  a nurse 24-7, just call the main number to the clinic [phone] and do not press any options, hold on the line and a nurse will answer the phone.    For prescription refill requests, have your pharmacy contact our office and allow 72 hours.    Due to Covid, you will need to wear a mask upon entering the hospital. If you do not have a mask, a mask will be given to you at the Main Entrance upon arrival. For doctor visits, patients may have 1 support person age 37 or older with them. For treatment visits, patients can not have anyone with them due to social distancing guidelines and our immunocompromised population.

## 2022-03-13 NOTE — Progress Notes (Signed)
Hypersensitivity Reaction note  Date of event: 03/13/22 Time of event: 1340 Generic name of drug involved: Albumin Name of provider notified of the hypersensitivity reaction: Dr. Delton Coombes Was agent that likely caused hypersensitivity reaction added to Allergies List within EMR? yes Chain of events including reaction signs/symptoms, treatment administered, and outcome (e.g., drug resumed; drug discontinued; sent to Emergency Department; etc.) Discontinued  [place], Beckie Salts, RN 03/13/2022 5:06 PM

## 2022-03-13 NOTE — Progress Notes (Signed)
Labs reviewed with MD today. Will proceed with treatment, iron and one unit of blood tomorrow.   1340-daughter comes out to get nurse and states her mother is itching and her tongue feels thick. Nurse enters room, stops the albumin that was be administered. Vitals obtained , MD notified. Administered new bag of plain normal saline . Pt then stated she was nauseated. Nurse gave Benadryl, pepcid, and zofran as ordered from MD. Patient spit up thick clear secretions multiple times. Tears were flowing from her eyes for no reason. Patient was salivating more than usual.  Scopolamine patch administered per orders.O2 sats were low so nasal cannula applied at 2 liters.   Patient observed for over an hour. No chemo today per MD.  1535-vitals rechecked and are stable , MD to re-evaluate pt. Ok to discharge pt home . Will give chemo tomorrow with unit of blood.   Vitals stable and discharged home from clinic via wheelchair. Follow up as scheduled.

## 2022-03-14 ENCOUNTER — Inpatient Hospital Stay (HOSPITAL_COMMUNITY): Payer: Medicare Other

## 2022-03-14 VITALS — BP 105/38 | HR 73 | Temp 97.4°F | Resp 17

## 2022-03-14 DIAGNOSIS — C220 Liver cell carcinoma: Secondary | ICD-10-CM

## 2022-03-14 DIAGNOSIS — Z5112 Encounter for antineoplastic immunotherapy: Secondary | ICD-10-CM | POA: Diagnosis not present

## 2022-03-14 MED ORDER — HEPARIN SOD (PORK) LOCK FLUSH 100 UNIT/ML IV SOLN: 500.0000 [IU] | Freq: Once | INTRAVENOUS | Status: DC | PRN

## 2022-03-14 MED ORDER — SODIUM CHLORIDE 0.9% FLUSH: 10.0000 mL | INTRAVENOUS | Status: DC | PRN

## 2022-03-14 MED ORDER — DIPHENHYDRAMINE HCL 25 MG PO CAPS
25.0000 mg | ORAL_CAPSULE | Freq: Once | ORAL | Status: AC
  Administered 2022-03-14: 25 mg via ORAL
  Filled 2022-03-14: qty 1

## 2022-03-14 MED ORDER — SODIUM CHLORIDE 0.9 % IV SOLN: Freq: Once | INTRAVENOUS | Status: AC

## 2022-03-14 MED ORDER — SODIUM CHLORIDE 0.9 % IV SOLN
1200.0000 mg | Freq: Once | INTRAVENOUS | Status: AC
  Administered 2022-03-14: 1200 mg via INTRAVENOUS
  Filled 2022-03-14: qty 20

## 2022-03-14 MED ORDER — HEPARIN SOD (PORK) LOCK FLUSH 100 UNIT/ML IV SOLN
500.0000 [IU] | Freq: Every day | INTRAVENOUS | Status: AC | PRN
  Administered 2022-03-14: 500 [IU]

## 2022-03-14 MED ORDER — SODIUM CHLORIDE 0.9% IV SOLUTION
250.0000 mL | Freq: Once | INTRAVENOUS | Status: AC
  Administered 2022-03-14: 250 mL via INTRAVENOUS

## 2022-03-14 MED ORDER — TRAMADOL HCL 50 MG PO TABS: 50.0000 mg | ORAL_TABLET | Freq: Once | ORAL | Status: DC

## 2022-03-14 MED ORDER — SODIUM CHLORIDE 0.9% FLUSH
10.0000 mL | INTRAVENOUS | Status: DC | PRN
  Administered 2022-03-14: 10 mL

## 2022-03-14 MED ORDER — SODIUM CHLORIDE 0.9 % IV SOLN
15.0000 mg/kg | Freq: Once | INTRAVENOUS | Status: AC
  Administered 2022-03-14: 1500 mg via INTRAVENOUS
  Filled 2022-03-14: qty 48

## 2022-03-14 NOTE — Progress Notes (Signed)
Patient unable to get chemotherapy yesterday due to reaction with Albumin.  OK to proceed with chemotherapy - Tecentriq and [place] today.  T.O. Dr Rhys Martini, PharmD

## 2022-03-14 NOTE — Progress Notes (Signed)
1238-vitals obtained , BP is low still. Notified PA. Educated daughter to recheck BP tonight before next dose of Coreg. If less than 100/60, she was told to call nurse line . In AM, check BP, if 120/60, she can restart Coreg. Daughter is comfortable with instructions and knows the number to call for any questions or concerns.     One unit of blood given today, Treatment given per orders. Patient tolerated it well without problems. Vitals stable and discharged home from clinic via wheelchair. Follow up as scheduled.

## 2022-03-14 NOTE — Patient Instructions (Signed)
[facility]  Discharge Instructions: Thank you for choosing [facility] to provide your oncology and hematology care.  If you have a lab appointment with the [facility], please come in thru the Main Entrance and check in at the main information desk.  Wear comfortable clothing and clothing appropriate for easy access to any Portacath or PICC line.   We strive to give you quality time with your provider. You may need to reschedule your appointment if you arrive late (15 or more minutes).  Arriving late affects you and other patients whose appointments are after yours.  Also, if you miss three or more appointments without notifying the office, you may be dismissed from the clinic at the provider's discretion.      For prescription refill requests, have your pharmacy contact our office and allow 72 hours for refills to be completed.    Today you received the following chemotherapy and/or immunotherapy agents avastin, tecentriq      To help prevent nausea and vomiting after your treatment, we encourage you to take your nausea medication as directed.  BELOW ARE SYMPTOMS THAT SHOULD BE REPORTED IMMEDIATELY: *FEVER GREATER THAN 100.4 F (38 C) OR HIGHER *CHILLS OR SWEATING *NAUSEA AND VOMITING THAT IS NOT CONTROLLED WITH YOUR NAUSEA MEDICATION *UNUSUAL SHORTNESS OF BREATH *UNUSUAL BRUISING OR BLEEDING *URINARY PROBLEMS (pain or burning when urinating, or frequent urination) *BOWEL PROBLEMS (unusual diarrhea, constipation, pain near the anus) TENDERNESS IN MOUTH AND THROAT WITH OR WITHOUT PRESENCE OF ULCERS (sore throat, sores in mouth, or a toothache) UNUSUAL RASH, SWELLING OR PAIN  UNUSUAL VAGINAL DISCHARGE OR ITCHING   Items with * indicate a potential emergency and should be followed up as soon as possible or go to the Emergency Department if any problems should occur.  Please show the CHEMOTHERAPY ALERT CARD or IMMUNOTHERAPY ALERT CARD at check-in to the  Emergency Department and triage nurse.  Should you have questions after your visit or need to cancel or reschedule your appointment, please contact [facility] [phone]  and follow the prompts.  Office hours are 8:00 a.m. to 4:30 p.m. Monday - Friday. Please note that voicemails left after 4:00 p.m. may not be returned until the following business day.  We are closed weekends and major holidays. You have access to a nurse at all times for urgent questions. Please call the main number to the clinic [phone] and follow the prompts.  For any non-urgent questions, you may also contact your provider using MyChart. We now offer e-Visits for anyone 72 and older to request care online for non-urgent symptoms. For details visit [email or web address].   Also download the MyChart app! Go to the app store, search "MyChart", open the app, select [facility], and log in with your MyChart username and password.  Masks are optional in the cancer centers. If you would like for your care team to wear a mask while they are taking care of you, please let them know. For doctor visits, patients may have with them one support person who is at least 78 years old. At this time, visitors are not allowed in the infusion area.

## 2022-03-16 LAB — TYPE AND SCREEN
ABO/RH(D): O POS
Antibody Screen: NEGATIVE
Unit division: 0

## 2022-03-16 LAB — BPAM RBC
Blood Product Expiration Date: 202308282359
ISSUE DATE / TIME: 202307280937
Unit Type and Rh: 5100

## 2022-03-17 ENCOUNTER — Telehealth: Payer: Self-pay

## 2022-03-17 ENCOUNTER — Other Ambulatory Visit (HOSPITAL_COMMUNITY): Payer: Self-pay | Admitting: *Deleted

## 2022-03-17 DIAGNOSIS — C220 Liver cell carcinoma: Secondary | ICD-10-CM

## 2022-03-17 NOTE — Telephone Encounter (Signed)
Patient daughter Wanda Curry called start Rosina Lowenstein on her mother and ready to start it and need some instructions about sugar being above or below 100.  Does not know exactly how to give it to her mother. Needs more instructions. [phone].

## 2022-03-18 ENCOUNTER — Other Ambulatory Visit: Payer: Self-pay

## 2022-03-20 ENCOUNTER — Inpatient Hospital Stay: Payer: Medicare Other | Attending: Hematology

## 2022-03-20 VITALS — BP 131/54 | HR 71 | Temp 97.4°F | Resp 16

## 2022-03-20 DIAGNOSIS — K5791 Diverticulosis of intestine, part unspecified, without perforation or abscess with bleeding: Secondary | ICD-10-CM

## 2022-03-20 DIAGNOSIS — N183 Chronic kidney disease, stage 3 unspecified: Secondary | ICD-10-CM

## 2022-03-20 DIAGNOSIS — Z79899 Other long term (current) drug therapy: Secondary | ICD-10-CM | POA: Insufficient documentation

## 2022-03-20 DIAGNOSIS — Z5112 Encounter for antineoplastic immunotherapy: Secondary | ICD-10-CM | POA: Insufficient documentation

## 2022-03-20 DIAGNOSIS — C182 Malignant neoplasm of ascending colon: Secondary | ICD-10-CM | POA: Insufficient documentation

## 2022-03-20 DIAGNOSIS — C787 Secondary malignant neoplasm of liver and intrahepatic bile duct: Secondary | ICD-10-CM | POA: Diagnosis not present

## 2022-03-20 DIAGNOSIS — D649 Anemia, unspecified: Secondary | ICD-10-CM

## 2022-03-20 MED ORDER — SODIUM CHLORIDE 0.9 % IV SOLN
400.0000 mg | Freq: Once | INTRAVENOUS | Status: AC
  Administered 2022-03-20: 400 mg via INTRAVENOUS
  Filled 2022-03-20: qty 20

## 2022-03-20 MED ORDER — ACETAMINOPHEN 325 MG PO TABS
650.0000 mg | ORAL_TABLET | Freq: Once | ORAL | Status: AC
  Administered 2022-03-20: 650 mg via ORAL
  Filled 2022-03-20: qty 2

## 2022-03-20 MED ORDER — SODIUM CHLORIDE 0.9 % IV SOLN: Freq: Once | INTRAVENOUS | Status: AC

## 2022-03-20 MED ORDER — SODIUM CHLORIDE 0.9% FLUSH
10.0000 mL | INTRAVENOUS | Status: DC | PRN
  Administered 2022-03-20: 10 mL

## 2022-03-20 MED ORDER — HEPARIN SOD (PORK) LOCK FLUSH 100 UNIT/ML IV SOLN
500.0000 [IU] | Freq: Once | INTRAVENOUS | Status: AC
  Administered 2022-03-20: 500 [IU] via INTRAVENOUS

## 2022-03-20 MED ORDER — LORATADINE 10 MG PO TABS
10.0000 mg | ORAL_TABLET | Freq: Once | ORAL | Status: AC
  Administered 2022-03-20: 10 mg via ORAL
  Filled 2022-03-20: qty 1

## 2022-03-20 NOTE — Patient Instructions (Signed)
[facility]  Discharge Instructions: Thank you for choosing [facility] to provide your oncology and hematology care.  If you have a lab appointment with the [facility], please come in thru the Main Entrance and check in at the main information desk.  Wear comfortable clothing and clothing appropriate for easy access to any Portacath or PICC line.   We strive to give you quality time with your provider. You may need to reschedule your appointment if you arrive late (15 or more minutes).  Arriving late affects you and other patients whose appointments are after yours.  Also, if you miss three or more appointments without notifying the office, you may be dismissed from the clinic at the provider's discretion.      For prescription refill requests, have your pharmacy contact our office and allow 72 hours for refills to be completed.    Today you received Venofer IV iron.    BELOW ARE SYMPTOMS THAT SHOULD BE REPORTED IMMEDIATELY: *FEVER GREATER THAN 100.4 F (38 C) OR HIGHER *CHILLS OR SWEATING *NAUSEA AND VOMITING THAT IS NOT CONTROLLED WITH YOUR NAUSEA MEDICATION *UNUSUAL SHORTNESS OF BREATH *UNUSUAL BRUISING OR BLEEDING *URINARY PROBLEMS (pain or burning when urinating, or frequent urination) *BOWEL PROBLEMS (unusual diarrhea, constipation, pain near the anus) TENDERNESS IN MOUTH AND THROAT WITH OR WITHOUT PRESENCE OF ULCERS (sore throat, sores in mouth, or a toothache) UNUSUAL RASH, SWELLING OR PAIN  UNUSUAL VAGINAL DISCHARGE OR ITCHING   Items with * indicate a potential emergency and should be followed up as soon as possible or go to the Emergency Department if any problems should occur.  Please show the CHEMOTHERAPY ALERT CARD or IMMUNOTHERAPY ALERT CARD at check-in to the Emergency Department and triage nurse.  Should you have questions after your visit or need to cancel or reschedule your appointment, please contact [facility] [phone]  and follow the prompts.  Office hours are 8:00 a.m. to 4:30 p.m. Monday - Friday. Please note that voicemails left after 4:00 p.m. may not be returned until the following business day.  We are closed weekends and major holidays. You have access to a nurse at all times for urgent questions. Please call the main number to the clinic [phone] and follow the prompts.  For any non-urgent questions, you may also contact your provider using MyChart. We now offer e-Visits for anyone 79 and older to request care online for non-urgent symptoms. For details visit [email or web address].   Also download the MyChart app! Go to the app store, search "MyChart", open the app, select [facility], and log in with your MyChart username and password.  Masks are optional in the cancer centers. If you would like for your care team to wear a mask while they are taking care of you, please let them know. For doctor visits, patients may have with them one support person who is at least 78 years old. At this time, visitors are not allowed in the infusion area.

## 2022-03-20 NOTE — Progress Notes (Signed)
Pt presents today for Venofer IV iron per provider's order. Vital signs stable and pt voiced no new complaints at this time.  Venofer 400 mg given today per MD orders. Tolerated infusion without adverse affects. Vital signs stable. No complaints at this time. Discharged from clinic via whhelchair in stable condition. Alert and oriented x 3. F/U with [facility] as scheduled.

## 2022-03-25 ENCOUNTER — Other Ambulatory Visit (HOSPITAL_COMMUNITY): Payer: Self-pay | Admitting: Specialist

## 2022-03-25 DIAGNOSIS — R1312 Dysphagia, oropharyngeal phase: Secondary | ICD-10-CM

## 2022-03-25 DIAGNOSIS — R6339 Other feeding difficulties: Secondary | ICD-10-CM

## 2022-04-01 ENCOUNTER — Ambulatory Visit (HOSPITAL_COMMUNITY): Payer: Medicare Other | Attending: Hematology | Admitting: Speech Pathology

## 2022-04-01 ENCOUNTER — Ambulatory Visit (HOSPITAL_COMMUNITY)
Admission: RE | Admit: 2022-04-01 | Discharge: 2022-04-01 | Disposition: A | Discharge status: Home / Self Care | Payer: Medicare Other | Source: Ambulatory Visit | Attending: Hematology | Admitting: Hematology

## 2022-04-01 DIAGNOSIS — R1312 Dysphagia, oropharyngeal phase: Secondary | ICD-10-CM | POA: Insufficient documentation

## 2022-04-01 DIAGNOSIS — R6339 Other feeding difficulties: Secondary | ICD-10-CM | POA: Insufficient documentation

## 2022-04-02 ENCOUNTER — Encounter (HOSPITAL_COMMUNITY): Payer: Self-pay | Admitting: Speech Pathology

## 2022-04-02 NOTE — Therapy (Signed)
[facility] [place], [place], [address] Phone: [phone]   Fax:  [phone]  Modified Barium Swallow  Patient Details  Name: Wanda Curry MRN: [id number] Date of Birth: 23-Sep-1945 No data recorded  Encounter Date: 04/01/2022   End of Session - 04/02/22 1334     Visit Number 1    Number of Visits 1    Authorization Type [facility]    SLP Start Time 3716    SLP Stop Time  9678    SLP Time Calculation (min) 33 min    Activity Tolerance Patient tolerated treatment well             Past Medical History:  Diagnosis Date   Acute pulmonary embolism ([facility]) 04/15/2017   Anemia    Arthritis    RA   Cancer ([facility])    skin cancer, colon, liver   Depression    Diabetes mellitus without complication (HCC)    Dyspnea    Fibromyalgia    GERD (gastroesophageal reflux disease)    GI bleed 08/12/2017   Heart murmur    ECHO scheduled 05-25-2015   Hyperlipidemia    Neuropathy    PONV (postoperative nausea and vomiting)    Puncture wound of great toe    Right leg DVT ([facility]) 12/01/2020    Past Surgical History:  Procedure Laterality Date   ABDOMINAL HYSTERECTOMY     BIOPSY  09/23/2016   Procedure: BIOPSY;  Surgeon: Danie Binder, MD;  [facility];  Service: Endoscopy;;  hepatic flexure mass   BIOPSY  02/06/2022   Procedure: BIOPSY;  Surgeon: Daneil Dolin, MD;  [facility];  Service: Endoscopy;;   CHOLECYSTECTOMY     COLONOSCOPY N/A 08/13/2017   Procedure: COLONOSCOPY;  Surgeon: Daneil Dolin, MD;  [facility];  Service: Endoscopy;  Laterality: N/A;   COLONOSCOPY WITH PROPOFOL N/A 09/23/2016   Procedure: COLONOSCOPY WITH PROPOFOL;  Surgeon: Danie Binder, MD;  [facility];  Service: Endoscopy;  Laterality: N/A;  7:30 am   ESOPHAGEAL BANDING  02/06/2022   Procedure: ESOPHAGEAL BANDING;  Surgeon: Daneil Dolin, MD;  [facility];  Service: Endoscopy;;    ESOPHAGOGASTRODUODENOSCOPY (EGD) WITH PROPOFOL N/A 02/06/2022   Procedure: ESOPHAGOGASTRODUODENOSCOPY (EGD) WITH PROPOFOL;  Surgeon: Daneil Dolin, MD;  [facility];  Service: Endoscopy;  Laterality: N/A;  patient has 1030 appt at Dr.McKinney's office. Will come to hospital after appt.   HARDWARE REMOVAL Right 05/30/2015   Procedure: HARDWARE REMOVAL;  Surgeon: Ninetta Lights, MD;  Location: [facility];  Service: Orthopedics;  Laterality: Right;   IR ANGIOGRAM SELECTIVE EACH ADDITIONAL VESSEL  07/25/2019   IR ANGIOGRAM SELECTIVE EACH ADDITIONAL VESSEL  07/25/2019   IR ANGIOGRAM SELECTIVE EACH ADDITIONAL VESSEL  01/02/2021   IR ANGIOGRAM SELECTIVE EACH ADDITIONAL VESSEL  01/02/2021   IR ANGIOGRAM SELECTIVE EACH ADDITIONAL VESSEL  01/02/2021   IR ANGIOGRAM SELECTIVE EACH ADDITIONAL VESSEL  01/18/2021   IR ANGIOGRAM SELECTIVE EACH ADDITIONAL VESSEL  02/15/2021   IR ANGIOGRAM SELECTIVE EACH ADDITIONAL VESSEL  02/15/2021   IR ANGIOGRAM VISCERAL SELECTIVE  07/25/2019   IR ANGIOGRAM VISCERAL SELECTIVE  07/25/2019   IR ANGIOGRAM VISCERAL SELECTIVE  01/02/2021   IR ANGIOGRAM VISCERAL SELECTIVE  01/02/2021   IR ANGIOGRAM VISCERAL SELECTIVE  01/18/2021   IR ANGIOGRAM VISCERAL SELECTIVE  02/15/2021   IR EMBO ARTERIAL NOT HEMORR HEMANG INC GUIDE ROADMAPPING  01/02/2021  IR EMBO TUMOR ORGAN ISCHEMIA INFARCT INC GUIDE ROADMAPPING  07/25/2019   IR EMBO TUMOR ORGAN ISCHEMIA INFARCT INC GUIDE ROADMAPPING  01/18/2021   IR EMBO TUMOR ORGAN ISCHEMIA INFARCT INC GUIDE ROADMAPPING  02/15/2021   IR RADIOLOGIST EVAL & MGMT  06/09/2019   IR RADIOLOGIST EVAL & MGMT  07/07/2019   IR RADIOLOGIST EVAL & MGMT  01/05/2020   IR RADIOLOGIST EVAL & MGMT  04/12/2020   IR RADIOLOGIST EVAL & MGMT  09/27/2020   IR RADIOLOGIST EVAL & MGMT  10/31/2020   IR RADIOLOGIST EVAL & MGMT  12/12/2020   IR RADIOLOGIST EVAL & MGMT  04/04/2021   IR RADIOLOGIST EVAL & MGMT  05/23/2021   IR RADIOLOGIST EVAL & MGMT  12/17/2021   IR US GUIDE VASC ACCESS RIGHT   07/25/2019   IR US GUIDE VASC ACCESS RIGHT  01/02/2021   IR US GUIDE VASC ACCESS RIGHT  01/18/2021   IR US GUIDE VASC ACCESS RIGHT  02/15/2021   PARTIAL COLECTOMY N/A 10/10/2016   Procedure: PARTIAL COLECTOMY;  Surgeon: Aviva Signs, MD;  Location: [facility];  Service: General;  Laterality: N/A;   planter warts Bilateral    both feet   POLYPECTOMY  09/23/2016   Procedure: POLYPECTOMY;  Surgeon: Danie Binder, MD;  [facility];  Service: Endoscopy;;  transverse colon polyps times 2, rectal polyp   PORTACATH PLACEMENT Left 01/08/2022   Procedure: INSERTION PORT-A-CATH;  Surgeon: Aviva Signs, MD;  Location: [facility];  Service: General;  Laterality: Left;   skin grafts     due to planter warts   TOTAL Curry ARTHROPLASTY Right 05/30/2015   Procedure: RIGHT TOTAL Curry ARTHROPLASTY;  Surgeon: Ninetta Lights, MD;  Location: [facility];  Service: Orthopedics;  Laterality: Right;    There were no vitals filed for this visit.        General - 04/02/22 1326       General Information   Date of Onset 03/13/22    HPI Wanda Curry is a 76 yo female who was referred for MBSS by Dr.Sreedhar Delton Coombes due to history of dyphagia.    Type of Study MBS-Modified Barium Swallow Study    Previous Swallow Assessment Barium Swallow 02/10/2022 and BSE 02/09/22    Diet Prior to this Study Dysphagia 3 (soft);Thin liquids    Temperature Spikes Noted No    Respiratory Status Room air    History of Recent Intubation No    Behavior/Cognition Alert;Cooperative;Pleasant mood    Oral Cavity Assessment Within Functional Limits    Oral Care Completed by SLP No    Oral Cavity - Dentition Adequate natural dentition    Vision Functional for self feeding    Self-Feeding Abilities Able to feed self    Patient Positioning Upright in chair    Baseline Vocal Quality Normal    Volitional Cough Strong    Volitional Swallow Able to elicit    Anatomy Within functional limits    Pharyngeal Secretions Not observed secondary  MBS                Oral Preparation/Oral Phase - 04/02/22 1330       Oral Preparation/Oral Phase   Oral Phase Within functional limits   Essentially WNL, however as study progressed, Pt with oral holding and esophageal sweep revealed extensive amount of retained barium in the esophagus     Electrical stimulation - Oral Phase   Was Electrical Stimulation Used No  Cricopharyngeal Phase - 04/02/22 1331       Cervical Esophageal Phase   Cervical Esophageal Phase Impaired      Cervical Esophageal Phase - Solids   Puree Esophageal backflow into the pharynx;Esophageal backflow into cervical esophagus      Cervical Esophageal Phase - Comment   Other Esophageal Phase Observations See radiologist's report; barium filled esophagus worse with purees and solids, retrograde movement                      Plan - 04/02/22 1334     Clinical Impression Statement Pt presents with             Patient will benefit from skilled therapeutic intervention in order to improve the following deficits and impairments:   Dysphagia, oropharyngeal phase     Recommendations/Treatment - 04/02/22 1333       Swallow Evaluation Recommendations   Recommended Consults Consider esophageal assessment    SLP Diet Recommendations Thin   full liquids   Liquid Administration via Cup    Medication Administration Whole meds with liquid    Supervision Patient able to self feed    Postural Changes Seated upright at 90 degrees;Remain upright for at least 30 minutes after feeds/meals              Prognosis - 04/02/22 1333       Prognosis   Prognosis for Safe Diet Advancement Guarded    Barriers to Reach Goals Severity of deficits    Barriers/Prognosis Comment severity of esophageal dysphagia      Individuals Consulted   Consulted and Agree with Results and Recommendations Patient;Family member/caregiver    Family Member Consulted daughter    Report Sent to   Referring physician             Problem List Patient Active Problem List   Diagnosis Date Noted   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[facility]) 02/26/2022   Dysphagia 02/11/2022   Cirrhosis of liver with ascites ([facility] [place])    Alcoholic cirrhosis of liver with ascites ([facility]) 02/10/2022   Sepsis secondary to UTI ([place]) 02/09/2022   Acute respiratory failure with hypoxia ([place]) 02/09/2022    Class: Acute   Anemia 02/08/2022   Intractable nausea and vomiting 02/07/2022   History of DVT (deep vein thrombosis) 02/07/2022   Normocytic anemia 01/30/2022   Onychomycosis 11/08/2021   Gastroesophageal reflux disease 11/08/2021   Chronic diarrhea 12/01/2020   RA (rheumatoid arthritis) ([place]) 12/01/2020   CKD (chronic kidney disease), stage III ([facility]) 12/01/2020   Hepatocellular carcinoma ([place]) 05/17/2019   GI bleed 08/12/2017   History of pulmonary embolism 08/12/2017   Type 2 diabetes mellitus ([facility]) 08/12/2017   RLS (restless legs syndrome) 08/12/2017   HTN (hypertension) 08/12/2017   Malignant neoplasm of colon ([place]) 11/26/2016   Colon cancer ([place]) 10/10/2016   DJD (degenerative joint disease) of Curry 05/30/2015    Maricarmen Braziel, CCC-SLP 04/02/2022, 1:35 PM  [facility] [address] [place], [place], [address] Phone: [phone]   Fax:  [phone]  Name: JERALD VILLALONA MRN: [id number] Date of Birth: September 05, 1945

## 2022-04-03 ENCOUNTER — Inpatient Hospital Stay: Payer: Medicare Other | Admitting: Hematology

## 2022-04-03 ENCOUNTER — Inpatient Hospital Stay: Payer: Medicare Other

## 2022-04-03 VITALS — Wt 196.0 lb

## 2022-04-03 VITALS — BP 124/41 | HR 73 | Temp 97.1°F | Resp 18

## 2022-04-03 DIAGNOSIS — Z5112 Encounter for antineoplastic immunotherapy: Secondary | ICD-10-CM | POA: Diagnosis not present

## 2022-04-03 DIAGNOSIS — C220 Liver cell carcinoma: Secondary | ICD-10-CM

## 2022-04-03 LAB — CBC WITH DIFFERENTIAL/PLATELET
Abs Immature Granulocytes: 0.02 10*3/uL (ref 0.00–0.07)
Basophils Absolute: 0 10*3/uL (ref 0.0–0.1)
Basophils Relative: 0 %
Eosinophils Absolute: 0.2 10*3/uL (ref 0.0–0.5)
Eosinophils Relative: 3 %
HCT: 36.8 % (ref 36.0–46.0)
Hemoglobin: 11.6 g/dL — ABNORMAL LOW (ref 12.0–15.0)
Immature Granulocytes: 0 %
Lymphocytes Relative: 14 %
Lymphs Abs: 0.8 10*3/uL (ref 0.7–4.0)
MCH: 30.8 pg (ref 26.0–34.0)
MCHC: 31.5 g/dL (ref 30.0–36.0)
MCV: 97.6 fL (ref 80.0–100.0)
Monocytes Absolute: 0.4 10*3/uL (ref 0.1–1.0)
Monocytes Relative: 8 %
Neutro Abs: 4.1 10*3/uL (ref 1.7–7.7)
Neutrophils Relative %: 75 %
Platelets: 137 10*3/uL — ABNORMAL LOW (ref 150–400)
RBC: 3.77 MIL/uL — ABNORMAL LOW (ref 3.87–5.11)
RDW: 17.8 % — ABNORMAL HIGH (ref 11.5–15.5)
WBC: 5.5 10*3/uL (ref 4.0–10.5)
nRBC: 0 % (ref 0.0–0.2)

## 2022-04-03 LAB — COMPREHENSIVE METABOLIC PANEL
ALT: 23 U/L (ref 0–44)
AST: 27 U/L (ref 15–41)
Albumin: 2.6 g/dL — ABNORMAL LOW (ref 3.5–5.0)
Alkaline Phosphatase: 67 U/L (ref 38–126)
Anion gap: 6 (ref 5–15)
BUN: 16 mg/dL (ref 8–23)
CO2: 27 mmol/L (ref 22–32)
Calcium: 8.6 mg/dL — ABNORMAL LOW (ref 8.9–10.3)
Chloride: 105 mmol/L (ref 98–111)
Creatinine, Ser: 0.6 mg/dL (ref 0.44–1.00)
GFR, Estimated: >60 mL/min (ref >60)
Glucose, Bld: 192 mg/dL — ABNORMAL HIGH (ref 70–99)
Potassium: 3.7 mmol/L (ref 3.5–5.1)
Sodium: 138 mmol/L (ref 135–145)
Total Bilirubin: 0.8 mg/dL (ref 0.3–1.2)
Total Protein: 6 g/dL — ABNORMAL LOW (ref 6.5–8.1)

## 2022-04-03 LAB — SAMPLE TO BLOOD BANK

## 2022-04-03 LAB — MAGNESIUM: Magnesium: 1.5 mg/dL — ABNORMAL LOW (ref 1.7–2.4)

## 2022-04-03 LAB — TSH: TSH: 0.387 u[IU]/mL (ref 0.350–4.500)

## 2022-04-03 MED ORDER — SODIUM CHLORIDE 0.9 % IV SOLN
1300.0000 mg | Freq: Once | INTRAVENOUS | Status: AC
  Administered 2022-04-03: 1300 mg via INTRAVENOUS
  Filled 2022-04-03: qty 48

## 2022-04-03 MED ORDER — SODIUM CHLORIDE 0.9 % IV SOLN
1200.0000 mg | Freq: Once | INTRAVENOUS | Status: AC
  Administered 2022-04-03: 1200 mg via INTRAVENOUS
  Filled 2022-04-03: qty 20

## 2022-04-03 MED ORDER — FLUOXETINE HCL 10 MG PO CAPS
10.0000 mg | ORAL_CAPSULE | Freq: Every day | ORAL | 3 refills | Status: DC
Start: 2022-04-03 — End: 2022-04-27

## 2022-04-03 MED ORDER — SODIUM CHLORIDE 0.9% FLUSH
10.0000 mL | INTRAVENOUS | Status: DC | PRN
  Administered 2022-04-03: 10 mL

## 2022-04-03 MED ORDER — SODIUM CHLORIDE 0.9 % IV SOLN: Freq: Once | INTRAVENOUS | Status: AC

## 2022-04-03 MED ORDER — MAGNESIUM SULFATE 2 GM/50ML IV SOLN
2.0000 g | Freq: Once | INTRAVENOUS | Status: AC
  Administered 2022-04-03: 2 g via INTRAVENOUS

## 2022-04-03 MED ORDER — MAGNESIUM SULFATE 2 GM/50ML IV SOLN
INTRAVENOUS | Status: AC
  Filled 2022-04-03: qty 50

## 2022-04-03 MED ORDER — HEPARIN SOD (PORK) LOCK FLUSH 100 UNIT/ML IV SOLN
500.0000 [IU] | Freq: Once | INTRAVENOUS | Status: AC | PRN
  Administered 2022-04-03: 500 [IU]

## 2022-04-03 MED ORDER — MAGNESIUM OXIDE -MG SUPPLEMENT 400 (240 MG) MG PO TABS: 400.0000 mg | ORAL_TABLET | Freq: Three times a day (TID) | ORAL | 3 refills | Status: DC

## 2022-04-03 NOTE — Progress Notes (Signed)
Pt presents today for Avastin and Tecentriq per provider's order. Vital signs and labs WNL for treatment. Pt's magnesium was 1.5 today. Pt will receive 2g IV magnesium per Dr.K. Avastin dose adjusted due to weight loss. Okay to proceed with treatment today per Dr.K.  Avastin and Tecentriq given today per MD orders. Tolerated infusion without adverse affects. Vital signs stable. No complaints at this time. Discharged from clinic via wheelchair in stable condition. Alert and oriented x 3. F/U with [facility] as scheduled.

## 2022-04-03 NOTE — Patient Instructions (Signed)
[facility]  Discharge Instructions: Thank you for choosing [facility] to provide your oncology and hematology care.  If you have a lab appointment with the [facility], please come in thru the Main Entrance and check in at the main information desk.  Wear comfortable clothing and clothing appropriate for easy access to any Portacath or PICC line.   We strive to give you quality time with your provider. You may need to reschedule your appointment if you arrive late (15 or more minutes).  Arriving late affects you and other patients whose appointments are after yours.  Also, if you miss three or more appointments without notifying the office, you may be dismissed from the clinic at the provider's discretion.      For prescription refill requests, have your pharmacy contact our office and allow 72 hours for refills to be completed.    Today you received the following chemotherapy and/or immunotherapy agents Tecentriq and Avastin   To help prevent nausea and vomiting after your treatment, we encourage you to take your nausea medication as directed.  BELOW ARE SYMPTOMS THAT SHOULD BE REPORTED IMMEDIATELY: *FEVER GREATER THAN 100.4 F (38 C) OR HIGHER *CHILLS OR SWEATING *NAUSEA AND VOMITING THAT IS NOT CONTROLLED WITH YOUR NAUSEA MEDICATION *UNUSUAL SHORTNESS OF BREATH *UNUSUAL BRUISING OR BLEEDING *URINARY PROBLEMS (pain or burning when urinating, or frequent urination) *BOWEL PROBLEMS (unusual diarrhea, constipation, pain near the anus) TENDERNESS IN MOUTH AND THROAT WITH OR WITHOUT PRESENCE OF ULCERS (sore throat, sores in mouth, or a toothache) UNUSUAL RASH, SWELLING OR PAIN  UNUSUAL VAGINAL DISCHARGE OR ITCHING   Items with * indicate a potential emergency and should be followed up as soon as possible or go to the Emergency Department if any problems should occur.  Please show the CHEMOTHERAPY ALERT CARD or IMMUNOTHERAPY ALERT CARD at check-in to  the Emergency Department and triage nurse.  Should you have questions after your visit or need to cancel or reschedule your appointment, please contact [facility] [phone]  and follow the prompts.  Office hours are 8:00 a.m. to 4:30 p.m. Monday - Friday. Please note that voicemails left after 4:00 p.m. may not be returned until the following business day.  We are closed weekends and major holidays. You have access to a nurse at all times for urgent questions. Please call the main number to the clinic [phone] and follow the prompts.  For any non-urgent questions, you may also contact your provider using MyChart. We now offer e-Visits for anyone 71 and older to request care online for non-urgent symptoms. For details visit [email or web address].   Also download the MyChart app! Go to the app store, search "MyChart", open the app, select [facility], and log in with your MyChart username and password.  Masks are optional in the cancer centers. If you would like for your care team to wear a mask while they are taking care of you, please let them know. You may have one support person who is at least 78 years old accompany you for your appointments.  Atezolizumab Injection What is this medication? ATEZOLIZUMAB (a te zoe LIZ ue mab) treats some types of cancer. It works by helping your immune system slow or stop the spread of cancer cells. It is a monoclonal antibody. This medicine may be used for other purposes; ask your health care provider or pharmacist if you have questions. COMMON BRAND NAME(S): Tecentriq What should I tell my care team before  I take this medication? They need to know if you have any of these conditions: Allogeneic stem cell transplant (uses someone else's stem cells) Autoimmune diseases, such as Crohn disease, ulcerative colitis, lupus History of chest radiation Nervous system problems, such as Guillain-Barre syndrome, myasthenia gravis Organ  transplant An unusual or allergic reaction to atezolizumab, other medications, foods, dyes, or preservatives Pregnant or trying to get pregnant Breast-feeding How should I use this medication? This medication is injected into a vein. It is given by your care team in a hospital or clinic setting. A special MedGuide will be given to you before each treatment. Be sure to read this information carefully each time. Talk to your care team about the use of this medication in children. While it may be prescribed for children as young as 2 years for selected conditions, precautions do apply. Overdosage: If you think you have taken too much of this medicine contact a poison control center or emergency room at once. NOTE: This medicine is only for you. Do not share this medicine with others. What if I miss a dose? Keep appointments for follow-up doses. It is important not to miss your dose. Call your care team if you are unable to keep an appointment. What may interact with this medication? Interactions have not been studied. This list may not describe all possible interactions. Give your health care provider a list of all the medicines, herbs, non-prescription drugs, or dietary supplements you use. Also tell them if you smoke, drink alcohol, or use illegal drugs. Some items may interact with your medicine. What should I watch for while using this medication? Your condition will be monitored carefully while you are receiving this medication. You may need blood work while taking this medication. This medication may cause serious skin reactions. They can happen weeks to months after starting the medication. Contact your care team right away if you notice fevers or flu-like symptoms with a rash. The rash may be red or purple and then turn into blisters or peeling of the skin. You may also notice a red rash with swelling of the face, lips, or lymph nodes in your neck or under your arms. Tell your care team right  away if you have any change in your eyesight. Talk to your care team if you may be pregnant. Serious birth defects can occur if you take this medication during pregnancy and for 5 months after the last dose. You will need a negative pregnancy test before starting this medication. Contraception is recommended while taking this medication and for 5 months after the last dose. Your care team can help you find the option that works for you. Do not breastfeed while taking this medication and for at least 5 months after the last dose. What side effects may I notice from receiving this medication? Side effects that you should report to your doctor or health care professional as soon as possible: Allergic reactions--skin rash, itching, hives, swelling of the face, lips, tongue, or throat Dry cough, shortness of breath or trouble breathing Eye pain, redness, irritation, or discharge with blurry or decreased vision Heart muscle inflammation--unusual weakness or fatigue, shortness of breath, chest pain, fast or irregular heartbeat, dizziness, swelling of the ankles, feet, or hands Hormone gland problems--headache, sensitivity to light, unusual weakness or fatigue, dizziness, fast or irregular heartbeat, increased sensitivity to cold or heat, excessive sweating, constipation, hair loss, increased thirst or amount of urine, tremors or shaking, irritability Infusion reactions--chest pain, shortness of breath or trouble  breathing, feeling faint or lightheaded Kidney injury (glomerulonephritis)--decrease in the amount of urine, red or dark brown urine, foamy or bubbly urine, swelling of the ankles, hands, or feet Liver injury--right upper belly pain, loss of appetite, nausea, light-colored stool, dark yellow or brown urine, yellowing skin or eyes, unusual weakness or fatigue Pain, tingling, or numbness in the hands or feet, muscle weakness, change in vision, confusion or trouble speaking, loss of balance or  coordination, trouble walking, seizures Rash, fever, and swollen lymph nodes Redness, blistering, peeling, or loosening of the skin, including inside the mouth Sudden or severe stomach pain, bloody diarrhea, fever, nausea, vomiting Side effects that usually do not require medical attention (report to your doctor or health care professional if they continue or are bothersome): Bone, joint, or muscle pain Diarrhea Fatigue Loss of appetite Nausea Skin rash This list may not describe all possible side effects. Call your doctor for medical advice about side effects. You may report side effects to FDA at 1-800-FDA-1088. Where should I keep my medication? This medication is given in a hospital or clinic. It will not be stored at home. NOTE: This sheet is a summary. It may not cover all possible information. If you have questions about this medicine, talk to your doctor, pharmacist, or health care provider.  2023 Elsevier/Gold Standard (2021-12-17 00:00:00)  Bevacizumab Injection What is this medication? BEVACIZUMAB (be va SIZ yoo mab) treats some types of cancer. It works by blocking a protein that causes cancer cells to grow and multiply. This helps to slow or stop the spread of cancer cells. It is a monoclonal antibody. This medicine may be used for other purposes; ask your health care provider or pharmacist if you have questions. COMMON BRAND NAME(S): Alymsys, Avastin, MVASI, Noah Charon What should I tell my care team before I take this medication? They need to know if you have any of these conditions: Blood clots Coughing up blood Having or recent surgery Heart failure High blood pressure History of a connection between 2 or more body parts that do not usually connect (fistula) History of a tear in your stomach or intestines Protein in your urine An unusual or allergic reaction to bevacizumab, other medications, foods, dyes, or preservatives Pregnant or trying to get  pregnant Breast-feeding How should I use this medication? This medication is injected into a vein. It is given by your care team in a hospital or clinic setting. Talk to your care team the use of this medication in children. Special care may be needed. Overdosage: If you think you have taken too much of this medicine contact a poison control center or emergency room at once. NOTE: This medicine is only for you. Do not share this medicine with others. What if I miss a dose? Keep appointments for follow-up doses. It is important not to miss your dose. Call your care team if you are unable to keep an appointment. What may interact with this medication? Interactions are not expected. This list may not describe all possible interactions. Give your health care provider a list of all the medicines, herbs, non-prescription drugs, or dietary supplements you use. Also tell them if you smoke, drink alcohol, or use illegal drugs. Some items may interact with your medicine. What should I watch for while using this medication? Your condition will be monitored carefully while you are receiving this medication. You may need blood work while taking this medication. This medication may make you feel generally unwell. This is not uncommon as chemotherapy can  affect healthy cells as well as cancer cells. Report any side effects. Continue your course of treatment even though you feel ill unless your care team tells you to stop. This medication may increase your risk to bruise or bleed. Call your care team if you notice any unusual bleeding. Before having surgery, talk to your care team to make sure it is ok. This medication can increase the risk of poor healing of your surgical site or wound. You will need to stop this medication for 28 days before surgery. After surgery, wait at least 28 days before restarting this medication. Make sure the surgical site or wound is healed enough before restarting this medication. Talk  to your care team if questions. Talk to your care team if you may be pregnant. Serious birth defects can occur if you take this medication during pregnancy and for 6 months after the last dose. Contraception is recommended while taking this medication and for 6 months after the last dose. Your care team can help you find the option that works for you. Do not breastfeed while taking this medication and for 6 months after the last dose. This medication can cause infertility. Talk to your care team if you are concerned about your fertility. What side effects may I notice from receiving this medication? Side effects that you should report to your care team as soon as possible: Allergic reactions--skin rash, itching, hives, swelling of the face, lips, tongue, or throat Bleeding--bloody or black, tar-like stools, vomiting blood or brown material that looks like coffee grounds, red or dark brown urine, small red or purple spots on skin, unusual bruising or bleeding Blood clot--pain, swelling, or warmth in the leg, shortness of breath, chest pain Heart attack--pain or tightness in the chest, shoulders, arms, or jaw, nausea, shortness of breath, cold or clammy skin, feeling faint or lightheaded Heart failure--shortness of breath, swelling of the ankles, feet, or hands, sudden weight gain, unusual weakness or fatigue Increase in blood pressure Infection--fever, chills, cough, sore throat, wounds that don't heal, pain or trouble when passing urine, general feeling of discomfort or being unwell Infusion reactions--chest pain, shortness of breath or trouble breathing, feeling faint or lightheaded Kidney injury--decrease in the amount of urine, swelling of the ankles, hands, or feet Stomach pain that is severe, does not go away, or gets worse Stroke--sudden numbness or weakness of the face, arm, or leg, trouble speaking, confusion, trouble walking, loss of balance or coordination, dizziness, severe headache,  change in vision Sudden and severe headache, confusion, change in vision, seizures, which may be signs of posterior reversible encephalopathy syndrome (PRES) Side effects that usually do not require medical attention (report to your care team if they continue or are bothersome): Back pain Change in taste Diarrhea Dry skin Increased tears Nosebleed This list may not describe all possible side effects. Call your doctor for medical advice about side effects. You may report side effects to FDA at 1-800-FDA-1088. Where should I keep my medication? This medication is given in a hospital or clinic. It will not be stored at home. NOTE: This sheet is a summary. It may not cover all possible information. If you have questions about this medicine, talk to your doctor, pharmacist, or health care provider.  2023 Elsevier/Gold Standard (2021-12-17 00:00:00)

## 2022-04-03 NOTE — Progress Notes (Signed)
[place], [place] [address]   CLINIC:  Medical Oncology/Hematology  PCP:  Lindell Spar, MD [address] / [place] [address] [phone]   REASON FOR VISIT:  Follow-up for right colon cancer, hepatocellular carcinoma and iron deficiency state  PRIOR THERAPY:  1. Right hemicolectomy on 10/10/2016. 2. Intermittent Feraheme last on 09/22/2018. 3. Bland embolization of hepatic lesion on 07/25/2019.  NGS Results: not done  CURRENT THERAPY: Atezolizumab and bevacizumab  BRIEF ONCOLOGIC HISTORY:  Oncology History  Colon cancer ([facility])  10/10/2016 Initial Diagnosis   Colon cancer ([facility])   10/10/2016 Surgery   Partial colectomy by Dr. Arnoldo Morale  Pathology shows  a 2.1 cm grade 2 adenocarcinoma of the ascending colon with invasion through the muscularis propria into the peri-colorectal tissues.      11/17/2016 PET scan   The hepatic lesion seen on the CT scan and MRI does not demonstrate hypermetabolism and is likely a benign entity.   Right maxillary sinus disease with associated hypermetabolism.   Scattered pulmonary nodules, likely benign. No follow-up needed if patient is low-risk (and has no known or suspected primary neoplasm). Non-contrast chest CT can be considered in 12 months if patient is high-risk.    12/25/2016 - 02/04/2017 Adjuvant Chemotherapy   Xeloda '2000mg'$  PO BID take for 14 days out of 21 days. Plan for total of 24 weeks of treatment.   Hepatocellular carcinoma ([facility])  05/17/2019 Initial Diagnosis   Hepatocellular carcinoma ([facility])   12/26/2021 -  Chemotherapy   Patient is on Treatment Plan : [place] Atezolizumab + Bevacizumab q21d Maintenance       CANCER STAGING:  Cancer Staging  Colon cancer [facility]) Staging form: Colon and Rectum, AJCC 8th Edition - Clinical: Stage IIA (cT3, cN0, cM0) - Signed by [name], MD on 11/26/2016   INTERVAL HISTORY:  Wanda Curry, a 76 y.o. female, seen for follow-up of  liver cancer.  She has received cycle 3 of Atezolizumab and bevacizumab 3 weeks ago.  She is feeling very tired.  Most of the day spent in the bed.  She is not able to eat any solid foods.  She is drinking 3 to 4 cans of equate complete nutrition daily.  She is receiving 20 units of [place].  At last visit I have increased her Lasix.  She lost 20 pounds.   REVIEW OF SYSTEMS:  Review of Systems  Constitutional:  Positive for unexpected weight change (-20 pounds). Negative for appetite change and fatigue.  HENT:   Positive for trouble swallowing. Negative for nosebleeds.   Cardiovascular:  Negative for chest pain and leg swelling.  Gastrointestinal:  Positive for vomiting. Negative for blood in stool, constipation and nausea.  Genitourinary:  Negative for hematuria.   Neurological:  Positive for numbness (feet). Negative for extremity weakness.  Psychiatric/Behavioral:  Negative for sleep disturbance.   All other systems reviewed and are negative.   PAST MEDICAL/SURGICAL HISTORY:  Past Medical History:  Diagnosis Date   Acute pulmonary embolism ([facility]) 04/15/2017   Anemia    Arthritis    RA   Cancer ([facility])    skin cancer, colon, liver   Depression    Diabetes mellitus without complication (HCC)    Dyspnea    Fibromyalgia    GERD (gastroesophageal reflux disease)    GI bleed 08/12/2017   Heart murmur    ECHO scheduled 05-25-2015   Hyperlipidemia    Neuropathy    PONV (postoperative nausea  and vomiting)    Puncture wound of great toe    Right leg DVT ([facility]) 12/01/2020   Past Surgical History:  Procedure Laterality Date   ABDOMINAL HYSTERECTOMY     BIOPSY  09/23/2016   Procedure: BIOPSY;  Surgeon: Danie Binder, MD;  [facility];  Service: Endoscopy;;  hepatic flexure mass   BIOPSY  02/06/2022   Procedure: BIOPSY;  Surgeon: Daneil Dolin, MD;  [facility];  Service: Endoscopy;;   CHOLECYSTECTOMY     COLONOSCOPY N/A 08/13/2017   Procedure: COLONOSCOPY;   Surgeon: Daneil Dolin, MD;  [facility];  Service: Endoscopy;  Laterality: N/A;   COLONOSCOPY WITH PROPOFOL N/A 09/23/2016   Procedure: COLONOSCOPY WITH PROPOFOL;  Surgeon: Danie Binder, MD;  [facility];  Service: Endoscopy;  Laterality: N/A;  7:30 am   ESOPHAGEAL BANDING  02/06/2022   Procedure: ESOPHAGEAL BANDING;  Surgeon: Daneil Dolin, MD;  [facility];  Service: Endoscopy;;   ESOPHAGOGASTRODUODENOSCOPY (EGD) WITH PROPOFOL N/A 02/06/2022   Procedure: ESOPHAGOGASTRODUODENOSCOPY (EGD) WITH PROPOFOL;  Surgeon: Daneil Dolin, MD;  [facility];  Service: Endoscopy;  Laterality: N/A;  patient has 1030 appt at Dr.McKinney's office. Will come to hospital after appt.   HARDWARE REMOVAL Right 05/30/2015   Procedure: HARDWARE REMOVAL;  Surgeon: Ninetta Lights, MD;  Location: [facility];  Service: Orthopedics;  Laterality: Right;   IR ANGIOGRAM SELECTIVE EACH ADDITIONAL VESSEL  07/25/2019   IR ANGIOGRAM SELECTIVE EACH ADDITIONAL VESSEL  07/25/2019   IR ANGIOGRAM SELECTIVE EACH ADDITIONAL VESSEL  01/02/2021   IR ANGIOGRAM SELECTIVE EACH ADDITIONAL VESSEL  01/02/2021   IR ANGIOGRAM SELECTIVE EACH ADDITIONAL VESSEL  01/02/2021   IR ANGIOGRAM SELECTIVE EACH ADDITIONAL VESSEL  01/18/2021   IR ANGIOGRAM SELECTIVE EACH ADDITIONAL VESSEL  02/15/2021   IR ANGIOGRAM SELECTIVE EACH ADDITIONAL VESSEL  02/15/2021   IR ANGIOGRAM VISCERAL SELECTIVE  07/25/2019   IR ANGIOGRAM VISCERAL SELECTIVE  07/25/2019   IR ANGIOGRAM VISCERAL SELECTIVE  01/02/2021   IR ANGIOGRAM VISCERAL SELECTIVE  01/02/2021   IR ANGIOGRAM VISCERAL SELECTIVE  01/18/2021   IR ANGIOGRAM VISCERAL SELECTIVE  02/15/2021   IR EMBO ARTERIAL NOT HEMORR HEMANG INC GUIDE ROADMAPPING  01/02/2021   IR EMBO TUMOR ORGAN ISCHEMIA INFARCT INC GUIDE ROADMAPPING  07/25/2019   IR EMBO TUMOR ORGAN ISCHEMIA INFARCT INC GUIDE ROADMAPPING  01/18/2021   IR EMBO TUMOR ORGAN ISCHEMIA INFARCT INC GUIDE ROADMAPPING  02/15/2021   IR RADIOLOGIST  EVAL & MGMT  06/09/2019   IR RADIOLOGIST EVAL & MGMT  07/07/2019   IR RADIOLOGIST EVAL & MGMT  01/05/2020   IR RADIOLOGIST EVAL & MGMT  04/12/2020   IR RADIOLOGIST EVAL & MGMT  09/27/2020   IR RADIOLOGIST EVAL & MGMT  10/31/2020   IR RADIOLOGIST EVAL & MGMT  12/12/2020   IR RADIOLOGIST EVAL & MGMT  04/04/2021   IR RADIOLOGIST EVAL & MGMT  05/23/2021   IR RADIOLOGIST EVAL & MGMT  12/17/2021   IR US GUIDE VASC ACCESS RIGHT  07/25/2019   IR US GUIDE VASC ACCESS RIGHT  01/02/2021   IR US GUIDE VASC ACCESS RIGHT  01/18/2021   IR US GUIDE VASC ACCESS RIGHT  02/15/2021   PARTIAL COLECTOMY N/A 10/10/2016   Procedure: PARTIAL COLECTOMY;  Surgeon: Aviva Signs, MD;  [facility];  Service: General;  Laterality: N/A;   planter warts Bilateral    both feet   POLYPECTOMY  09/23/2016  Procedure: POLYPECTOMY;  Surgeon: Danie Binder, MD;  [facility];  Service: Endoscopy;;  transverse colon polyps times 2, rectal polyp   PORTACATH PLACEMENT Left 01/08/2022   Procedure: INSERTION PORT-A-CATH;  Surgeon: Aviva Signs, MD;  Location[facility];  Service: General;  Laterality: Left;   skin grafts     due to planter warts   TOTAL KNEE ARTHROPLASTY Right 05/30/2015   Procedure: RIGHT TOTAL KNEE ARTHROPLASTY;  Surgeon: Ninetta Lights, MD;  Location: [facility];  Service: Orthopedics;  Laterality: Right;    SOCIAL HISTORY:  Social History   Socioeconomic History   Marital status: Widowed    Spouse name: Not on file   Number of children: Not on file   Years of education: Not on file   Highest education level: Not on file  Occupational History   Not on file  Tobacco Use   Smoking status: Former    Packs/day: 1.00    Years: 40.00    Total pack years: 40.00    Types: Cigarettes    Quit date: 08/18/2004    Years since quitting: 17.6   Smokeless tobacco: Never  Vaping Use   Vaping Use: Never used  Substance and Sexual Activity   Alcohol use: No   Drug use: No   Sexual activity: Not Currently    Birth  control/protection: Surgical  Other Topics Concern   Not on file  Social History Narrative   Not on file   Social Determinants of Health   Financial Resource Strain: Low Risk  (01/29/2022)   Overall Financial Resource Strain (CARDIA)    Difficulty of Paying Living Expenses: Not hard at all  Food Insecurity: No Food Insecurity (01/29/2022)   Hunger Vital Sign    Worried About Running Out of Food in the Last Year: Never true    [place] in the Last Year: Never true  Transportation Needs: No Transportation Needs (01/29/2022)   PRAPARE - Hydrologist (Medical): No    Lack of Transportation (Non-Medical): No  Physical Activity: Insufficiently Active (01/29/2022)   Exercise Vital Sign    Days of Exercise per Week: 7 days    Minutes of Exercise per Session: 20 min  Stress: No Stress Concern Present (01/29/2022)   [place]    Feeling of Stress : Not at all  Social Connections: Moderately Isolated (01/29/2022)   Social Connection and Isolation Panel [NHANES]    Frequency of Communication with Friends and Family: More than three times a week    Frequency of Social Gatherings with Friends and Family: More than three times a week    Attends Religious Services: 1 to 4 times per year    Active Member of [employer] or Organizations: No    Attends Archivist Meetings: Never    Marital Status: Widowed  Intimate Partner Violence: Not At Risk (01/29/2022)   Humiliation, Afraid, Rape, and Kick questionnaire    Fear of Current or Ex-Partner: No    Emotionally Abused: No    Physically Abused: No    Sexually Abused: No    FAMILY HISTORY:  Family History  Problem Relation Age of Onset   Breast cancer Mother    Breast cancer Daughter    Colon cancer Neg Hx     CURRENT MEDICATIONS:  Current Outpatient Medications  Medication Sig Dispense Refill   acetaminophen (TYLENOL) 500 MG tablet Take  500 mg by mouth  every 6 (six) hours as needed for moderate pain.     Atezolizumab (TECENTRIQ IV) Inject into the vein every 21 ( twenty-one) days.     Bevacizumab (AVASTIN IV) Inject into the vein every 21 ( twenty-one) days.     carvedilol (COREG) 6.25 MG tablet Take 6.25 mg by mouth 2 (two) times daily with a meal.     cholecalciferol (VITAMIN D3) 25 MCG (1000 UNIT) tablet Take 1,000 Units by mouth daily.     folic acid (FOLVITE) 1 MG tablet Take 1 mg by mouth daily.     furosemide (LASIX) 40 MG tablet Take 1 tablet (40 mg total) by mouth daily as needed for edema. 30 tablet 3   gabapentin (NEURONTIN) 600 MG tablet Take 600 mg by mouth 3 (three) times daily as needed (pain).     Glucagon (GVOKE HYPOPEN 2-PACK) 1 MG/0.2ML SOAJ Inject 0.2 mLs into the skin as needed (Blood glucose less than 55). 0.4 mL 11   glucosamine-chondroitin 500-400 MG tablet Take 2 tablets by mouth daily.     insulin degludec (TRESIBA FLEXTOUCH) 100 UNIT/ML FlexTouch Pen Inject 40 Units into the skin at bedtime. 36 mL 0   lactulose (CHRONULAC) 10 GM/15ML solution Take 30 mLs (20 g total) by mouth daily. 236 mL 0   lidocaine-prilocaine (EMLA) cream Apply to affected area once 30 g 3   loperamide (IMODIUM) 2 MG capsule Take 2 mg by mouth as needed for diarrhea or loose stools.     magnesium oxide (MAG-OX) 400 (240 Mg) MG tablet Take 1 tablet (400 mg total) by mouth 3 (three) times daily. 90 tablet 3   methotrexate (RHEUMATREX) 2.5 MG tablet Take 20 mg by mouth every Sunday. Caution:Chemotherapy. Protect from light.     NOVOLOG MIX 70/30 FLEXPEN (70-30) 100 UNIT/ML FlexPen Inject 20-30 Units into the skin See admin instructions. Inject 30 units into the skin in the morning and inject 20 units in the evening  3   omeprazole (PRILOSEC) 40 MG capsule Take 40 mg by mouth daily.   3   ondansetron (ZOFRAN-ODT) 8 MG disintegrating tablet Take 1 tablet (8 mg total) by mouth every 8 (eight) hours as needed for nausea or vomiting. 20  tablet 0   prochlorperazine (COMPAZINE) 10 MG tablet Take 1 tablet (10 mg total) by mouth every 6 (six) hours as needed for nausea or vomiting. 30 tablet 3   rifaximin (XIFAXAN) 550 MG TABS tablet Take 1 tablet (550 mg total) by mouth 2 (two) times daily. 56 tablet 0   rivaroxaban (XARELTO) 20 MG TABS tablet Take 1 tablet (20 mg total) by mouth daily with supper. 30 tablet 6   rOPINIRole (REQUIP) 1 MG tablet Take 1 mg by mouth 3 (three) times daily as needed (restless leg).     No current facility-administered medications for this visit.   Facility-Administered Medications Ordered in Other Visits  Medication Dose Route Frequency Provider Last Rate Last Admin   0.9 %  sodium chloride infusion   Intravenous Continuous Holley Bouche, NP   Stopped at 09/14/17 1440    ALLERGIES:  Allergies  Allergen Reactions   Xeloda [Capecitabine] Other (See Comments)    Blisters and pain to skin to include feet and arms   Adhesive [Tape]     Adhesive tape and band-aids cause skin irritation   Albumin (Human) Itching    Tongue thickness/increased secretions   Neosporin Original [Bacitracin-Neomycin-Polymyxin] Other (See Comments)    blister   Sulfa Antibiotics Nausea Only  and Other (See Comments)    Joint paint   Erythromycin Itching and Rash    burning    PHYSICAL EXAM:  Performance status (ECOG): 2 - Symptomatic, <50% confined to bed  There were no vitals filed for this visit. Wt Readings from Last 3 Encounters:  03/13/22 217 lb 3.2 oz (98.5 kg)  03/04/22 215 lb 9.6 oz (97.8 kg)  02/14/22 214 lb 4.6 oz (97.2 kg)   Physical Exam Vitals reviewed.  Constitutional:      Appearance: Normal appearance. She is obese.  Cardiovascular:     Rate and Rhythm: Normal rate and regular rhythm.     Pulses: Normal pulses.     Heart sounds: Normal heart sounds.  Pulmonary:     Effort: Pulmonary effort is normal.     Breath sounds: Normal breath sounds.  Neurological:     General: No focal deficit  present.     Mental Status: She is alert and oriented to person, place, and time.  Psychiatric:        Mood and Affect: Mood normal.        Behavior: Behavior normal.    LABORATORY DATA:  I have reviewed the labs as listed.     Latest Ref Rng & Units 03/13/2022    9:05 AM 02/14/2022    4:13 AM 02/13/2022    4:34 AM  CBC  WBC 4.0 - 10.5 K/uL 4.8  6.0  4.9   Hemoglobin 12.0 - 15.0 g/dL 7.6  8.5  8.1   Hematocrit 36.0 - 46.0 % 24.5  27.5  26.8   Platelets 150 - 400 K/uL 163  98  98       Latest Ref Rng & Units 03/13/2022    9:05 AM 02/14/2022    4:13 AM 02/13/2022    4:34 AM  CMP  Glucose 70 - 99 mg/dL 80  191  179   BUN 8 - 23 mg/dL '14  14  16   '$ Creatinine 0.44 - 1.00 mg/dL 0.80  0.69  0.65   Sodium 135 - 145 mmol/L 137  139  141   Potassium 3.5 - 5.1 mmol/L 3.6  3.4  3.6   Chloride 98 - 111 mmol/L 104  105  107   CO2 22 - 32 mmol/L '27  30  30   '$ Calcium 8.9 - 10.3 mg/dL 8.4  8.2  8.1   Total Protein 6.5 - 8.1 g/dL 5.6   4.8   Total Bilirubin 0.3 - 1.2 mg/dL 0.7   1.0   Alkaline Phos 38 - 126 U/L 70   43   AST 15 - 41 U/L 31   21   ALT 0 - 44 U/L 26   18     DIAGNOSTIC IMAGING:  I have independently reviewed the scans and discussed with the patient. DG OP Swallowing Func-Medicare/Speech Path  Result Date: 04/01/2022 CLINICAL DATA:  Dysphagia. Cough/GE reflux disease/other secondary diagnosis EXAM: MODIFIED BARIUM SWALLOW TECHNIQUE: Different consistencies of barium were administered orally to the patient by the Speech Pathologist. Imaging of the pharynx was performed in the lateral projection. The radiologist was present in the fluoroscopy room for this study, providing personal supervision. FLUOROSCOPY: Radiation Exposure Index (as provided by the fluoroscopic device): 107.1 mGy Kerma COMPARISON:  None Available. FINDINGS: Vestibular Penetration: Laryngeal penetration to the vocal cords with thin barium, multiple episodes. Aspiration: Minimal aspiration into proximal trachea on  several occasions. No spontaneous cough reflex. Other: Marked esophageal dysmotility. Relative narrowing at [employer]  junction. Small sliding hiatal hernia incidentally noted. 12.5 mm diameter barium tablet made it to the distal esophagus but was not seen passing into the stomach IMPRESSION: Swallowing dysfunction as above. Please refer to the Speech Pathologists report for complete details and recommendations. Electronically Signed   By: Lavonia Dana M.D.   On: 04/01/2022 14:32     ASSESSMENT:  1.  Hepatocellular carcinoma: -MRI of the abdomen with Eovist on 04/21/2019 showed hypoenhancing lesion in the liver, which was not typical for [facility].  It was not significantly hypermetabolic on prior PET/CT.  This lesion has been gradually increasing since 10/08/2016. - AFP was normal at 3.9. -Biopsy on 05/11/2019 shows well to moderately differentiated hepatocellular carcinoma, steatohepatitic variant. -CT chest on 05/30/2019 was negative for metastatic disease. -Bland embolization of the hepatic lesion on 07/25/2019. -MRI of the abdomen with and without contrast on 09/21/2020 showed previous treated lesion in the central aspect of the liver measures 4.4 x 3.2 cm, previously 3.1 x 2.6 cm.  Hypervascular left liver lobe lesion measuring 1.3 x 1.1 cm.  6 mm hypervascular focus noted in the inferior aspect of the right lobe of the liver. -CTAP with and without contrast on 10/25/2020 showed left liver lesion measuring 1.8 x 1.4 cm, for inferior right liver lesion measuring 1 x 0.9 cm which are new.  Recurrent hyperenhancing tumor at the treated tumor site in the central liver, measuring 6 x 5 cm with extension into the caudate lobe.  No evidence of metastatic disease in the chest. - Left lobe liver biopsy on 11/29/2020-well to moderately differentiated [place]. - MRI (12/12/2021): Several new arterial phase enhancing lesions concerning for progressive malignancy. - Cycle 1 of Atezolizumab on 12/26/2021.   2.  Stage IIa (CT3CN0) colon  cancer: -Right hemicolectomy on 10/10/2016, grade 2 adenocarcinoma, positive LVSI, margins negative, 0/19 lymph nodes positive. -Because of positive LVSI, she was recommended Xeloda.  She could not tolerate more than 2 cycles.  She developed severe hand-foot skin reaction. -PET scan on 02/23/2018- for metastatic disease.  Liver mass was also not seen. -CEA on 03/29/2019 was 4.5. -CT AP on 03/29/2019 showed mass of the liver measuring 5.2 cm, previously 4.8 cm.   3.  Diarrhea: -She has watery diarrhea up to once per day.  She takes Imodium 1-2 tablets/day which helps.  She had this diarrhea since surgery.   4.  Unprovoked PE: -Incidental PE on CT chest in August 2018.  She was treated with Xarelto 20 mg for a year. -Dose was reduced to 10 mg daily in October 2019.  She is tolerating it without any problems.   PLAN:  1.  Liver confined hepatocellular carcinoma: - She has completed third cycle 3 weeks ago.  She received both bevacizumab and Atezolizumab. - Overall her general condition is getting worse.  We talked about best supportive care in the form of hospice.  We have discussed and we will proceed with cycle 4 today.  We will arrange for CT CAP in [redacted] weeks along with AFP level. - If any worsening, will refer to hospice at that time.  I do not believe she has adequate performance status for second line therapy. - She also has depression.  We will start her on Prozac 10 mg daily which has worked in the past.  We will also make a dietary consultation as she is not able to eat any solid foods based on swallow study.  She is completely dependent on liquid foods.   2.  Stage  IIa (CT3CN0) colon cancer: - Lung CEA was normal at 4.7.  Last CT scan did not show any recurrence.   3.  Unprovoked PE: - Continue Xarelto 20 mg daily.   4.  Normocytic anemia: -She has finished 3 infusions of Venofer on 03/21/2023.  Hemoglobin today improved to 11.6.  5.  Hypomagnesemia: - Magnesium is 1.5.  We will send a  prescription for magnesium oxide 400 mg 3 times daily.  6.  Lower extremity swelling: - She has 2+ edema.  Continue Lasix 40 mg daily as needed.  She lost 20 pounds mostly fluid weight.     Orders placed this encounter:  No orders of the defined types were placed in this encounter.    Derek Jack, MD [facility] [phone]   I, Thana Ates, am acting as a scribe for Dr. Derek Jack.  I, Derek Jack MD, have reviewed the above documentation for accuracy and completeness, and I agree with the above.

## 2022-04-03 NOTE — Patient Instructions (Signed)
[place] at [facility] Discharge Instructions   You were seen and examined today by Dr. Delton Coombes.  We will proceed with treatment today. After this we will repeat a CT scan to see how the treatment is working. If we haven't made any progress treating this cancer after seeing the scan, we will stop treatment.   You lab work from today is pending.   Return in 3 week to review lab work CT scan.    Thank you for choosing [place] at [facility] to provide your oncology and hematology care.  To afford each patient quality time with our provider, please arrive at least 15 minutes before your scheduled appointment time.   If you have a lab appointment with the [facility] please come in thru the Main Entrance and check in at the main information desk.  You need to re-schedule your appointment should you arrive 10 or more minutes late.  We strive to give you quality time with our providers, and arriving late affects you and other patients whose appointments are after yours.  Also, if you no show three or more times for appointments you may be dismissed from the clinic at the providers discretion.     Again, thank you for choosing [facility].  Our hope is that these requests will decrease the amount of time that you wait before being seen by our physicians.       _____________________________________________________________  Should you have questions after your visit to [facility], please contact our office at [phone] and follow the prompts.  Our office hours are 8:00 a.m. and 4:30 p.m. Monday - Friday.  Please note that voicemails left after 4:00 p.m. may not be returned until the following business day.  We are closed weekends and major holidays.  You do have access to a nurse 24-7, just call the main number to the clinic [phone] and do not press any options, hold on the line and a nurse will answer the  phone.    For prescription refill requests, have your pharmacy contact our office and allow 72 hours.    Due to Covid, you will need to wear a mask upon entering the hospital. If you do not have a mask, a mask will be given to you at the Main Entrance upon arrival. For doctor visits, patients may have 1 support person age 82 or older with them. For treatment visits, patients can not have anyone with them due to social distancing guidelines and our immunocompromised population.

## 2022-04-03 NOTE — Progress Notes (Signed)
Patient has been examined by Dr. Katragadda, and vital signs and labs have been reviewed. ANC, Creatinine, LFTs, hemoglobin, and platelets are within treatment parameters per M.D. - pt may proceed with treatment.    °

## 2022-04-03 NOTE — Progress Notes (Signed)
Patient with weight loss.  New dose bevacizumab = '15mg'$ /kg with weight 88.9 kg = 1300 mg.  Ok to adjust.  V.O. Dr Rhys Martini, PharmD

## 2022-04-05 LAB — AFP TUMOR MARKER: AFP, Serum, Tumor Marker: 2168 ng/mL — ABNORMAL HIGH (ref 0.0–9.2)

## 2022-04-14 ENCOUNTER — Inpatient Hospital Stay: Payer: Medicare Other

## 2022-04-14 ENCOUNTER — Inpatient Hospital Stay: Payer: Medicare Other | Admitting: Dietician

## 2022-04-14 DIAGNOSIS — Z5112 Encounter for antineoplastic immunotherapy: Secondary | ICD-10-CM | POA: Diagnosis not present

## 2022-04-14 MED ORDER — SUCRALFATE 1 GM/10ML PO SUSP: 1.0000 g | Freq: Three times a day (TID) | ORAL | 3 refills | Status: DC

## 2022-04-14 MED ORDER — HEPARIN SOD (PORK) LOCK FLUSH 100 UNIT/ML IV SOLN
500.0000 [IU] | Freq: Once | INTRAVENOUS | Status: AC
  Administered 2022-04-14: 500 [IU] via INTRAVENOUS

## 2022-04-14 MED ORDER — POTASSIUM CHLORIDE IN NACL 20-0.9 MEQ/L-% IV SOLN
INTRAVENOUS | Status: DC
  Filled 2022-04-14 (×5): qty 1000

## 2022-04-14 MED ORDER — SODIUM CHLORIDE 0.9% FLUSH
10.0000 mL | Freq: Once | INTRAVENOUS | Status: AC
  Administered 2022-04-14: 10 mL via INTRAVENOUS

## 2022-04-14 MED ORDER — MAGNESIUM SULFATE 2 GM/50ML IV SOLN
2.0000 g | Freq: Once | INTRAVENOUS | Status: AC
  Administered 2022-04-14: 2 g via INTRAVENOUS
  Filled 2022-04-14: qty 50

## 2022-04-14 NOTE — Progress Notes (Signed)
Per Dr. Delton Coombes give House fluids over 2 hours. Patient tolerated therapy with no complaints voiced. Side effects with management reviewed with understanding verbalized. Port site clean and dry with no bruising or swelling noted at site. Good blood return noted before and after administration of therapy. Band aid applied. Patient left in satisfactory condition with VSS and no s/s of distress noted.

## 2022-04-14 NOTE — Progress Notes (Signed)
Nutrition Assessment   Reason for Assessment: liquid diet    ASSESSMENT: 76 year old female with colon cancer, HCC. Patient underwent right hemicolectomy in 2018 followed by adjuvant chemotherapy and bland embolization of hepatic lesion in 2020. She is receiving maintenance Atezolizumab + Bevacizumab q21d  Past medical history includes acute PE, arthritis, depression, IDDM, fibromyalgia, GERD, GIB, HLD, DVT of RLE, HLD  Noted EGD with esophageal varices - EBL under the care of Dr. Gala Romney 02/06/22  Liquid diet recommended second to marked esophageal dysmotility/relative narrowing at GE s/p MBSS on 8/15  Met with patient and daughter in clinic. Daughter reports worsening dysphagia. Patient endorses tolerating Ensure, ice cream, water, thin soups up until the last week. Patient now having difficulty with liquids. Daughter reports frequent regurgitation of all liquid consistencies. She was able to keep down a few sips of chicken broth last night. Daughter reports this is the first thing she has tolerated in 3 days. Patient reports difficulty with oral medications and unable to swallow applesauce. Daughter is crushing Lasix for patient. She reports pt has decreased urine output and urine is dark in color. Patient is not interested in having additional endoscopy as she reports  complications requiring hospitalization after procedure. Daughter who is tearful says she understands and respects pt decisions.   Nutrition Focused Physical Exam: deferred   Medications: Coreg, D3, Prozac, Folvite, Lasix, Gabapentin, Tresiba, Imodium, Mag-ox, Methotrexate, Prilosec, Zofran, Compazine, Xarelto    Labs: 8/17 - glucose 192, Mg 1.5 6/28 - HgbA1c 6.8  Anthropometrics: weights have decreased 8.8% (19 lbs) in one month which is severe for time frame, however expect losses related in part to fluid - noted lasix 40 mg started 7/27  Height: 5'3" Weight: 196 lb (8/17) UBW: 203 lb  BMI: 34.72  7/18 - 215 lb  9.6 6/15 - 223 lb 14.4 5/11 - 216 lb 6.4 4/11 - 202 lb 4.8 3/24 - 203 lb 3.2   NUTRITION DIAGNOSIS: Inadequate oral intake related to swallowing difficulty as evidenced by esophageal dysmotility per MBSS, liquid diet, reported worsening dysphagia with decreased toleration to liquid consistencies.    INTERVENTION:  Discussed pt with MD - supportive care/hospice referral discussed with pt and daughter on 8/17, further recommendations pending CT CAP on 9/6 Pt to receive IV fluids today Trial of carafate  PO meds to be crushed or switched to liquids as able per pharmacy Encouraged small frequent sips of high calorie liquids as tolerated Feeding tube not recommended -pt has declined further EGD and/or surgical procedures  Provide active listening and support  MONITORING, EVALUATION, GOAL: Patient will increase calories and protein via liquid diet as tolerated    Next Visit: To be scheduled as needed

## 2022-04-14 NOTE — Addendum Note (Signed)
Addended by: Joie Bimler on: 04/14/2022 04:56 PM   Modules accepted: Orders

## 2022-04-14 NOTE — Patient Instructions (Signed)
[facility]  Discharge Instructions: Thank you for choosing [facility] to provide your oncology and hematology care.  If you have a lab appointment with the Cancer Center, please come in thru the Main Entrance and check in at the main information desk.  Wear comfortable clothing and clothing appropriate for easy access to any Portacath or PICC line.   We strive to give you quality time with your provider. You may need to reschedule your appointment if you arrive late (15 or more minutes).  Arriving late affects you and other patients whose appointments are after yours.  Also, if you miss three or more appointments without notifying the office, you may be dismissed from the clinic at the provider's discretion.      For prescription refill requests, have your pharmacy contact our office and allow 72 hours for refills to be completed.    Today you received the following house fluids, return as scheduled.   To help prevent nausea and vomiting after your treatment, we encourage you to take your nausea medication as directed.  BELOW ARE SYMPTOMS THAT SHOULD BE REPORTED IMMEDIATELY: *FEVER GREATER THAN 100.4 F (38 C) OR HIGHER *CHILLS OR SWEATING *NAUSEA AND VOMITING THAT IS NOT CONTROLLED WITH YOUR NAUSEA MEDICATION *UNUSUAL SHORTNESS OF BREATH *UNUSUAL BRUISING OR BLEEDING *URINARY PROBLEMS (pain or burning when urinating, or frequent urination) *BOWEL PROBLEMS (unusual diarrhea, constipation, pain near the anus) TENDERNESS IN MOUTH AND THROAT WITH OR WITHOUT PRESENCE OF ULCERS (sore throat, sores in mouth, or a toothache) UNUSUAL RASH, SWELLING OR PAIN  UNUSUAL VAGINAL DISCHARGE OR ITCHING   Items with * indicate a potential emergency and should be followed up as soon as possible or go to the Emergency Department if any problems should occur.  Please show the CHEMOTHERAPY ALERT CARD or IMMUNOTHERAPY ALERT CARD at check-in to the Emergency Department and  triage nurse.  Should you have questions after your visit or need to cancel or reschedule your appointment, please contact [facility] [phone]  and follow the prompts.  Office hours are 8:00 a.m. to 4:30 p.m. Monday - Friday. Please note that voicemails left after 4:00 p.m. may not be returned until the following business day.  We are closed weekends and major holidays. You have access to a nurse at all times for urgent questions. Please call the main number to the clinic [phone] and follow the prompts.  For any non-urgent questions, you may also contact your provider using MyChart. We now offer e-Visits for anyone 18 and older to request care online for non-urgent symptoms. For details visit [email or web address].   Also download the MyChart app! Go to the app store, search "MyChart", open the app, select , and log in with your MyChart username and password.  Masks are optional in the cancer centers. If you would like for your care team to wear a mask while they are taking care of you, please let them know. You may have one support person who is at least 78 years old accompany you for your appointments.  

## 2022-04-20 ENCOUNTER — Other Ambulatory Visit: Payer: Self-pay | Admitting: Hematology

## 2022-04-20 DIAGNOSIS — C220 Liver cell carcinoma: Secondary | ICD-10-CM

## 2022-04-23 ENCOUNTER — Other Ambulatory Visit: Payer: Self-pay

## 2022-04-23 ENCOUNTER — Ambulatory Visit (HOSPITAL_COMMUNITY)
Admission: RE | Admit: 2022-04-23 | Discharge: 2022-04-23 | Disposition: A | Discharge status: Home / Self Care | Payer: Medicare Other | Source: Ambulatory Visit | Attending: Hematology | Admitting: Hematology

## 2022-04-23 DIAGNOSIS — C220 Liver cell carcinoma: Secondary | ICD-10-CM | POA: Insufficient documentation

## 2022-04-23 MED ORDER — HEPARIN SOD (PORK) LOCK FLUSH 100 UNIT/ML IV SOLN
INTRAVENOUS | Status: AC
  Administered 2022-04-23: 500 [IU] via INTRAVENOUS
  Filled 2022-04-23: qty 5

## 2022-04-23 MED ORDER — IOHEXOL 300 MG/ML  SOLN
100.0000 mL | Freq: Once | INTRAMUSCULAR | Status: AC | PRN
Start: 2022-04-23 — End: 2022-04-23
  Administered 2022-04-23: 100 mL via INTRAVENOUS

## 2022-04-24 ENCOUNTER — Encounter: Payer: Self-pay | Admitting: *Deleted

## 2022-04-24 ENCOUNTER — Other Ambulatory Visit: Payer: Self-pay | Admitting: *Deleted

## 2022-04-24 ENCOUNTER — Telehealth: Payer: Self-pay | Admitting: Internal Medicine

## 2022-04-24 ENCOUNTER — Inpatient Hospital Stay: Payer: Medicare Other

## 2022-04-24 ENCOUNTER — Inpatient Hospital Stay: Payer: Medicare Other | Attending: Hematology

## 2022-04-24 ENCOUNTER — Inpatient Hospital Stay (HOSPITAL_BASED_OUTPATIENT_CLINIC_OR_DEPARTMENT_OTHER): Payer: Medicare Other | Admitting: Hematology

## 2022-04-24 DIAGNOSIS — C220 Liver cell carcinoma: Secondary | ICD-10-CM

## 2022-04-24 DIAGNOSIS — Z79899 Other long term (current) drug therapy: Secondary | ICD-10-CM | POA: Insufficient documentation

## 2022-04-24 DIAGNOSIS — D509 Iron deficiency anemia, unspecified: Secondary | ICD-10-CM | POA: Diagnosis not present

## 2022-04-24 DIAGNOSIS — C182 Malignant neoplasm of ascending colon: Secondary | ICD-10-CM | POA: Insufficient documentation

## 2022-04-24 LAB — COMPREHENSIVE METABOLIC PANEL
ALT: 37 U/L (ref 0–44)
AST: 29 U/L (ref 15–41)
Albumin: 2.9 g/dL — ABNORMAL LOW (ref 3.5–5.0)
Alkaline Phosphatase: 92 U/L (ref 38–126)
Anion gap: 6 (ref 5–15)
BUN: 16 mg/dL (ref 8–23)
CO2: 30 mmol/L (ref 22–32)
Calcium: 9 mg/dL (ref 8.9–10.3)
Chloride: 100 mmol/L (ref 98–111)
Creatinine, Ser: 0.63 mg/dL (ref 0.44–1.00)
GFR, Estimated: >60 mL/min (ref >60)
Glucose, Bld: 284 mg/dL — ABNORMAL HIGH (ref 70–99)
Potassium: 4 mmol/L (ref 3.5–5.1)
Sodium: 136 mmol/L (ref 135–145)
Total Bilirubin: 1.8 mg/dL — ABNORMAL HIGH (ref 0.3–1.2)
Total Protein: 6.3 g/dL — ABNORMAL LOW (ref 6.5–8.1)

## 2022-04-24 LAB — CBC WITH DIFFERENTIAL/PLATELET
Abs Immature Granulocytes: 0.05 10*3/uL (ref 0.00–0.07)
Basophils Absolute: 0 10*3/uL (ref 0.0–0.1)
Basophils Relative: 0 %
Eosinophils Absolute: 0.1 10*3/uL (ref 0.0–0.5)
Eosinophils Relative: 1 %
HCT: 40.7 % (ref 36.0–46.0)
Hemoglobin: 13.5 g/dL (ref 12.0–15.0)
Immature Granulocytes: 1 %
Lymphocytes Relative: 7 %
Lymphs Abs: 0.8 10*3/uL (ref 0.7–4.0)
MCH: 31.4 pg (ref 26.0–34.0)
MCHC: 33.2 g/dL (ref 30.0–36.0)
MCV: 94.7 fL (ref 80.0–100.0)
Monocytes Absolute: 1 10*3/uL (ref 0.1–1.0)
Monocytes Relative: 9 %
Neutro Abs: 8.9 10*3/uL — ABNORMAL HIGH (ref 1.7–7.7)
Neutrophils Relative %: 82 %
Platelets: 105 10*3/uL — ABNORMAL LOW (ref 150–400)
RBC: 4.3 MIL/uL (ref 3.87–5.11)
RDW: 15.7 % — ABNORMAL HIGH (ref 11.5–15.5)
WBC: 10.9 10*3/uL — ABNORMAL HIGH (ref 4.0–10.5)
nRBC: 0 % (ref 0.0–0.2)

## 2022-04-24 LAB — SAMPLE TO BLOOD BANK

## 2022-04-24 LAB — TSH: TSH: 0.659 u[IU]/mL (ref 0.350–4.500)

## 2022-04-24 LAB — MAGNESIUM: Magnesium: 1.5 mg/dL — ABNORMAL LOW (ref 1.7–2.4)

## 2022-04-24 MED ORDER — SCOPOLAMINE 1 MG/3DAYS TD PT72: 1.0000 | MEDICATED_PATCH | TRANSDERMAL | 12 refills | Status: DC

## 2022-04-24 MED ORDER — SODIUM CHLORIDE 0.9% FLUSH
10.0000 mL | Freq: Once | INTRAVENOUS | Status: AC
  Administered 2022-04-24: 10 mL via INTRAVENOUS

## 2022-04-24 MED ORDER — SODIUM CHLORIDE 0.9 % IV SOLN: INTRAVENOUS | Status: DC

## 2022-04-24 MED ORDER — TRAMADOL HCL 50 MG PO TABS: 50.0000 mg | ORAL_TABLET | Freq: Four times a day (QID) | ORAL | 0 refills | Status: DC | PRN

## 2022-04-24 MED ORDER — HEPARIN SOD (PORK) LOCK FLUSH 100 UNIT/ML IV SOLN
500.0000 [IU] | Freq: Once | INTRAVENOUS | Status: AC
  Administered 2022-04-24: 500 [IU] via INTRAVENOUS

## 2022-04-24 MED ORDER — ONDANSETRON 8 MG PO TBDP: 8.0000 mg | ORAL_TABLET | Freq: Three times a day (TID) | ORAL | 3 refills | Status: DC | PRN

## 2022-04-24 NOTE — Patient Instructions (Signed)
[facility]  Discharge Instructions: Thank you for choosing [facility] to provide your oncology and hematology care.  If you have a lab appointment with the Cancer Center, please come in thru the Main Entrance and check in at the main information desk.  Wear comfortable clothing and clothing appropriate for easy access to any Portacath or PICC line.   We strive to give you quality time with your provider. You may need to reschedule your appointment if you arrive late (15 or more minutes).  Arriving late affects you and other patients whose appointments are after yours.  Also, if you miss three or more appointments without notifying the office, you may be dismissed from the clinic at the provider's discretion.      For prescription refill requests, have your pharmacy contact our office and allow 72 hours for refills to be completed.     To help prevent nausea and vomiting after your treatment, we encourage you to take your nausea medication as directed.  BELOW ARE SYMPTOMS THAT SHOULD BE REPORTED IMMEDIATELY: *FEVER GREATER THAN 100.4 F (38 C) OR HIGHER *CHILLS OR SWEATING *NAUSEA AND VOMITING THAT IS NOT CONTROLLED WITH YOUR NAUSEA MEDICATION *UNUSUAL SHORTNESS OF BREATH *UNUSUAL BRUISING OR BLEEDING *URINARY PROBLEMS (pain or burning when urinating, or frequent urination) *BOWEL PROBLEMS (unusual diarrhea, constipation, pain near the anus) TENDERNESS IN MOUTH AND THROAT WITH OR WITHOUT PRESENCE OF ULCERS (sore throat, sores in mouth, or a toothache) UNUSUAL RASH, SWELLING OR PAIN  UNUSUAL VAGINAL DISCHARGE OR ITCHING   Items with * indicate a potential emergency and should be followed up as soon as possible or go to the Emergency Department if any problems should occur.  Please show the CHEMOTHERAPY ALERT CARD or IMMUNOTHERAPY ALERT CARD at check-in to the Emergency Department and triage nurse.  Should you have questions after your visit or need to  cancel or reschedule your appointment, please contact [facility] AT  [phone]  and follow the prompts.  Office hours are 8:00 a.m. to 4:30 p.m. Monday - Friday. Please note that voicemails left after 4:00 p.m. may not be returned until the following business day.  We are closed weekends and major holidays. You have access to a nurse at all times for urgent questions. Please call the main number to the clinic [phone] and follow the prompts.  For any non-urgent questions, you may also contact your provider using MyChart. We now offer e-Visits for anyone 18 and older to request care online for non-urgent symptoms. For details visit [email or web address].   Also download the MyChart app! Go to the app store, search "MyChart", open the app, select [facility], and log in with your MyChart username and password.  Masks are optional in the cancer centers. If you would like for your care team to wear a mask while they are taking care of you, please let them know. You may have one support person who is at least 78 years old accompany you for your appointments.  

## 2022-04-24 NOTE — Patient Instructions (Signed)
[facility] Discharge Instructions   You were seen and examined today by Dr. Delton Coombes.  He reviewed the results of your CT scan which showed a response to treatment.   We will hold your treatment today and give you IV fluids. Since you are not able to eat and maintain your nutrition, Dr. Raliegh Ip recommends stopping treatment.   We will make a referral to [facility].   You may return to the clinic as needed. We will not schedule any further follow-up here, but you may call if you need anything.    Thank you for choosing [place] [facility] to provide your oncology and hematology care.  To afford each patient quality time with our provider, please arrive at least 15 minutes before your scheduled appointment time.   If you have a lab appointment with the [facility] please come in thru the Main Entrance and check in at the main information desk.  You need to re-schedule your appointment should you arrive 10 or more minutes late.  We strive to give you quality time with our providers, and arriving late affects you and other patients whose appointments are after yours.  Also, if you no show three or more times for appointments you may be dismissed from the clinic at the providers discretion.     Again, thank you for choosing [facility].  Our hope is that these requests will decrease the amount of time that you wait before being seen by our physicians.       _____________________________________________________________  Should you have questions after your visit to [facility], please contact our office at [phone] and follow the prompts.  Our office hours are 8:00 a.m. and 4:30 p.m. Monday - Friday.  Please note that voicemails left after 4:00 p.m. may not be returned until the following business day.  We are closed weekends and major holidays.  You do have access to a nurse 24-7, just  call the main number to the clinic [phone] and do not press any options, hold on the line and a nurse will answer the phone.    For prescription refill requests, have your pharmacy contact our office and allow 72 hours.    Due to Covid, you will need to wear a mask upon entering the hospital. If you do not have a mask, a mask will be given to you at the Main Entrance upon arrival. For doctor visits, patients may have 1 support person age 66 or older with them. For treatment visits, patients can not have anyone with them due to social distancing guidelines and our immunocompromised population.

## 2022-04-24 NOTE — Progress Notes (Signed)
Patient tolerated hydration with no complaints voiced.  Port site clean and dry with good blood return noted before and after hydration.  No bruising or swelling noted with port.  Band aid applied.  VSS with discharge and left ambulatory with no s/s of distress noted.   

## 2022-04-24 NOTE — Telephone Encounter (Signed)
Tammy with Hospice, [phone]  Called wanting to know if Dr. Posey Pronto would be attending for pt in hospice?

## 2022-04-24 NOTE — Progress Notes (Signed)
Approval from [employer] received for scopolamine patches.  Approved from 08/18/21-04/24/23.

## 2022-04-24 NOTE — Telephone Encounter (Signed)
Verbal orders given to be attending

## 2022-04-25 ENCOUNTER — Encounter: Payer: Self-pay | Admitting: Hematology

## 2022-04-25 ENCOUNTER — Encounter (HOSPITAL_COMMUNITY): Payer: Self-pay | Admitting: Hematology

## 2022-04-25 NOTE — Progress Notes (Signed)
[place], [place] [address]   CLINIC:  Medical Oncology/Hematology  PCP:  Lindell Spar, MD [address] / [place] [address] [phone]   REASON FOR VISIT:  Follow-up for right colon cancer, hepatocellular carcinoma and iron deficiency state  PRIOR THERAPY:  1. Right hemicolectomy on 10/10/2016. 2. Intermittent Feraheme last on 09/22/2018. 3. Bland embolization of hepatic lesion on 07/25/2019.  NGS Results: not done  CURRENT THERAPY: Atezolizumab and bevacizumab  BRIEF ONCOLOGIC HISTORY:  Oncology History  Colon cancer ([place])  10/10/2016 Initial Diagnosis   Colon cancer ([place])   10/10/2016 Surgery   Partial colectomy by Dr. Arnoldo Morale  Pathology shows  a 2.1 cm grade 2 adenocarcinoma of the ascending colon with invasion through the muscularis propria into the peri-colorectal tissues.      11/17/2016 PET scan   The hepatic lesion seen on the CT scan and MRI does not demonstrate hypermetabolism and is likely a benign entity.   Right maxillary sinus disease with associated hypermetabolism.   Scattered pulmonary nodules, likely benign. No follow-up needed if patient is low-risk (and has no known or suspected primary neoplasm). Non-contrast chest CT can be considered in 12 months if patient is high-risk.    12/25/2016 - 02/04/2017 Adjuvant Chemotherapy   Xeloda '2000mg'$  PO BID take for 14 days out of 21 days. Plan for total of 24 weeks of treatment.   Hepatocellular carcinoma ([place])  05/17/2019 Initial Diagnosis   Hepatocellular carcinoma ([place])   12/26/2021 - 04/03/2022 Chemotherapy   Patient is on Treatment Plan : [facility] Atezolizumab + Bevacizumab q21d Maintenance     12/26/2021 -  Chemotherapy   Patient is on Treatment Plan : HEPATOCELLULAR Atezolizumab + Bevacizumab q21d       CANCER STAGING:  Cancer Staging  Colon cancer [facility]) Staging form: Colon and Rectum, AJCC 8th Edition - Clinical: Stage IIA (cT3, cN0, cM0) - Signed  by [name], MD on 11/26/2016   INTERVAL HISTORY:  Ms. JAYRA CHOYCE, a 76 y.o. female, seen for follow-up of hepatocellular cancer.  Cycle 4 of Atezolizumab and bevacizumab was on 04/03/2022.  Denies any rectal bleeding.  However she is continuing to struggle swallowing.  She cannot eat much.  She cannot even take medications consistently.  She has tried Carafate which did not help with swallowing.  Even after swallowing, she occasionally throws up food or pills.   REVIEW OF SYSTEMS:  Review of Systems  Constitutional:  Negative for appetite change, fatigue and unexpected weight change.  HENT:   Positive for trouble swallowing. Negative for nosebleeds.   Gastrointestinal:  Positive for constipation, nausea and vomiting.  Psychiatric/Behavioral:  Positive for sleep disturbance.   All other systems reviewed and are negative.   PAST MEDICAL/SURGICAL HISTORY:  Past Medical History:  Diagnosis Date   Acute pulmonary embolism ([place]) 04/15/2017   Anemia    Arthritis    RA   Cancer (HCC)    skin cancer, colon, liver   Depression    Diabetes mellitus without complication (HCC)    Dyspnea    Fibromyalgia    GERD (gastroesophageal reflux disease)    GI bleed 08/12/2017   Heart murmur    ECHO scheduled 05-25-2015   Hyperlipidemia    Neuropathy    PONV (postoperative nausea and vomiting)    Puncture wound of great toe    Right leg DVT ([place]) 12/01/2020   Past Surgical History:  Procedure Laterality Date   ABDOMINAL HYSTERECTOMY  BIOPSY  09/23/2016   Procedure: BIOPSY;  Surgeon: Danie Binder, MD;  [facility];  Service: Endoscopy;;  hepatic flexure mass   BIOPSY  02/06/2022   Procedure: BIOPSY;  Surgeon: Daneil Dolin, MD;  [facility];  Service: Endoscopy;;   CHOLECYSTECTOMY     COLONOSCOPY N/A 08/13/2017   Procedure: COLONOSCOPY;  Surgeon: Daneil Dolin, MD;  [facility];  Service: Endoscopy;  Laterality: N/A;   COLONOSCOPY WITH  PROPOFOL N/A 09/23/2016   Procedure: COLONOSCOPY WITH PROPOFOL;  Surgeon: Danie Binder, MD;  [facility];  Service: Endoscopy;  Laterality: N/A;  7:30 am   ESOPHAGEAL BANDING  02/06/2022   Procedure: ESOPHAGEAL BANDING;  Surgeon: Daneil Dolin, MD;  [facility];  Service: Endoscopy;;   ESOPHAGOGASTRODUODENOSCOPY (EGD) WITH PROPOFOL N/A 02/06/2022   Procedure: ESOPHAGOGASTRODUODENOSCOPY (EGD) WITH PROPOFOL;  Surgeon: Daneil Dolin, MD;  [facility];  Service: Endoscopy;  Laterality: N/A;  patient has 1030 appt at Dr.McKinney's office. Will come to hospital after appt.   HARDWARE REMOVAL Right 05/30/2015   Procedure: HARDWARE REMOVAL;  Surgeon: Ninetta Lights, MD;  Location: [facility];  Service: Orthopedics;  Laterality: Right;   IR ANGIOGRAM SELECTIVE EACH ADDITIONAL VESSEL  07/25/2019   IR ANGIOGRAM SELECTIVE EACH ADDITIONAL VESSEL  07/25/2019   IR ANGIOGRAM SELECTIVE EACH ADDITIONAL VESSEL  01/02/2021   IR ANGIOGRAM SELECTIVE EACH ADDITIONAL VESSEL  01/02/2021   IR ANGIOGRAM SELECTIVE EACH ADDITIONAL VESSEL  01/02/2021   IR ANGIOGRAM SELECTIVE EACH ADDITIONAL VESSEL  01/18/2021   IR ANGIOGRAM SELECTIVE EACH ADDITIONAL VESSEL  02/15/2021   IR ANGIOGRAM SELECTIVE EACH ADDITIONAL VESSEL  02/15/2021   IR ANGIOGRAM VISCERAL SELECTIVE  07/25/2019   IR ANGIOGRAM VISCERAL SELECTIVE  07/25/2019   IR ANGIOGRAM VISCERAL SELECTIVE  01/02/2021   IR ANGIOGRAM VISCERAL SELECTIVE  01/02/2021   IR ANGIOGRAM VISCERAL SELECTIVE  01/18/2021   IR ANGIOGRAM VISCERAL SELECTIVE  02/15/2021   IR EMBO ARTERIAL NOT HEMORR HEMANG INC GUIDE ROADMAPPING  01/02/2021   IR EMBO TUMOR ORGAN ISCHEMIA INFARCT INC GUIDE ROADMAPPING  07/25/2019   IR EMBO TUMOR ORGAN ISCHEMIA INFARCT INC GUIDE ROADMAPPING  01/18/2021   IR EMBO TUMOR ORGAN ISCHEMIA INFARCT INC GUIDE ROADMAPPING  02/15/2021   IR RADIOLOGIST EVAL & MGMT  06/09/2019   IR RADIOLOGIST EVAL & MGMT  07/07/2019   IR RADIOLOGIST EVAL & MGMT  01/05/2020   IR  RADIOLOGIST EVAL & MGMT  04/12/2020   IR RADIOLOGIST EVAL & MGMT  09/27/2020   IR RADIOLOGIST EVAL & MGMT  10/31/2020   IR RADIOLOGIST EVAL & MGMT  12/12/2020   IR RADIOLOGIST EVAL & MGMT  04/04/2021   IR RADIOLOGIST EVAL & MGMT  05/23/2021   IR RADIOLOGIST EVAL & MGMT  12/17/2021   IR US GUIDE VASC ACCESS RIGHT  07/25/2019   IR US GUIDE VASC ACCESS RIGHT  01/02/2021   IR US GUIDE VASC ACCESS RIGHT  01/18/2021   IR US GUIDE VASC ACCESS RIGHT  02/15/2021   PARTIAL COLECTOMY N/A 10/10/2016   Procedure: PARTIAL COLECTOMY;  Surgeon: Aviva Signs, MD;  [facility];  Service: General;  Laterality: N/A;   planter warts Bilateral    both feet   POLYPECTOMY  09/23/2016   Procedure: POLYPECTOMY;  Surgeon: Danie Binder, MD;  [facility];  Service: Endoscopy;;  transverse colon polyps times 2, rectal polyp   PORTACATH PLACEMENT Left 01/08/2022   Procedure: INSERTION PORT-A-CATH;  Surgeon: Aviva Signs, MD;  [facility];  Service: General;  Laterality: Left;   skin grafts     due to planter warts   TOTAL KNEE ARTHROPLASTY Right 05/30/2015   Procedure: RIGHT TOTAL KNEE ARTHROPLASTY;  Surgeon: Ninetta Lights, MD;  Location: [facility];  Service: Orthopedics;  Laterality: Right;    SOCIAL HISTORY:  Social History   Socioeconomic History   Marital status: Widowed    Spouse name: Not on file   Number of children: Not on file   Years of education: Not on file   Highest education level: Not on file  Occupational History   Not on file  Tobacco Use   Smoking status: Former    Packs/day: 1.00    Years: 40.00    Total pack years: 40.00    Types: Cigarettes    Quit date: 08/18/2004    Years since quitting: 17.6   Smokeless tobacco: Never  Vaping Use   Vaping Use: Never used  Substance and Sexual Activity   Alcohol use: No   Drug use: No   Sexual activity: Not Currently    Birth control/protection: Surgical  Other Topics Concern   Not on file  Social History Narrative   Not on file    Social Determinants of Health   Financial Resource Strain: Low Risk  (01/29/2022)   Overall Financial Resource Strain (CARDIA)    Difficulty of Paying Living Expenses: Not hard at all  Food Insecurity: No Food Insecurity (01/29/2022)   Hunger Vital Sign    Worried About Running Out of Food in the Last Year: Never true    [place] in the Last Year: Never true  Transportation Needs: No Transportation Needs (01/29/2022)   PRAPARE - Hydrologist (Medical): No    Lack of Transportation (Non-Medical): No  Physical Activity: Insufficiently Active (01/29/2022)   Exercise Vital Sign    Days of Exercise per Week: 7 days    Minutes of Exercise per Session: 20 min  Stress: No Stress Concern Present (01/29/2022)   [place]    Feeling of Stress : Not at all  Social Connections: Moderately Isolated (01/29/2022)   Social Connection and Isolation Panel [NHANES]    Frequency of Communication with Friends and Family: More than three times a week    Frequency of Social Gatherings with Friends and Family: More than three times a week    Attends Religious Services: 1 to 4 times per year    Active Member of [employer] or Organizations: No    Attends Archivist Meetings: Never    Marital Status: Widowed  Intimate Partner Violence: Not At Risk (01/29/2022)   Humiliation, Afraid, Rape, and Kick questionnaire    Fear of Current or Ex-Partner: No    Emotionally Abused: No    Physically Abused: No    Sexually Abused: No    FAMILY HISTORY:  Family History  Problem Relation Age of Onset   Breast cancer Mother    Breast cancer Daughter    Colon cancer Neg Hx     CURRENT MEDICATIONS:  Current Outpatient Medications  Medication Sig Dispense Refill   Atezolizumab (TECENTRIQ IV) Inject into the vein every 21 ( twenty-one) days.     Bevacizumab (AVASTIN IV) Inject into the vein every 21 ( twenty-one)  days.     scopolamine (TRANSDERM-SCOP) 1 MG/3DAYS Place 1 patch (1.5 mg total) onto the skin every  3 (three) days. 10 patch 12   acetaminophen (TYLENOL) 500 MG tablet Take 500 mg by mouth every 6 (six) hours as needed for moderate pain. (Patient not taking: Reported on 04/24/2022)     carvedilol (COREG) 6.25 MG tablet Take 6.25 mg by mouth 2 (two) times daily with a meal. (Patient not taking: Reported on 04/24/2022)     cholecalciferol (VITAMIN D3) 25 MCG (1000 UNIT) tablet Take 1,000 Units by mouth daily. (Patient not taking: Reported on 04/24/2022)     FLUoxetine (PROZAC) 10 MG capsule Take 1 capsule (10 mg total) by mouth daily. (Patient not taking: Reported on 04/24/2022) 30 capsule 3   folic acid (FOLVITE) 1 MG tablet Take 1 mg by mouth daily. (Patient not taking: Reported on 04/24/2022)     furosemide (LASIX) 40 MG tablet Take 1 tablet (40 mg total) by mouth daily as needed for edema. (Patient not taking: Reported on 04/24/2022) 30 tablet 3   gabapentin (NEURONTIN) 600 MG tablet Take 600 mg by mouth 3 (three) times daily as needed (pain). (Patient not taking: Reported on 04/24/2022)     Glucagon (GVOKE HYPOPEN 2-PACK) 1 MG/0.2ML SOAJ Inject 0.2 mLs into the skin as needed (Blood glucose less than 55). (Patient not taking: Reported on 04/24/2022) 0.4 mL 11   glucosamine-chondroitin 500-400 MG tablet Take 2 tablets by mouth daily. (Patient not taking: Reported on 04/24/2022)     insulin degludec (TRESIBA FLEXTOUCH) 100 UNIT/ML FlexTouch Pen Inject 40 Units into the skin at bedtime. (Patient not taking: Reported on 04/24/2022) 36 mL 0   lactulose (CHRONULAC) 10 GM/15ML solution Take 30 mLs (20 g total) by mouth daily. (Patient not taking: Reported on 04/24/2022) 236 mL 0   loperamide (IMODIUM) 2 MG capsule Take 2 mg by mouth as needed for diarrhea or loose stools. (Patient not taking: Reported on 04/24/2022)     magnesium oxide (MAG-OX) 400 (240 Mg) MG tablet Take 1 tablet (400 mg total) by mouth 3 (three) times daily.  (Patient not taking: Reported on 04/24/2022) 90 tablet 3   methotrexate (RHEUMATREX) 2.5 MG tablet Take 20 mg by mouth every Sunday. Caution:Chemotherapy. Protect from light. (Patient not taking: Reported on 04/24/2022)     omeprazole (PRILOSEC) 40 MG capsule Take 40 mg by mouth daily.  (Patient not taking: Reported on 04/24/2022)  3   ondansetron (ZOFRAN-ODT) 8 MG disintegrating tablet Take 1 tablet (8 mg total) by mouth every 8 (eight) hours as needed for nausea or vomiting. 20 tablet 3   prochlorperazine (COMPAZINE) 10 MG tablet Take 1 tablet (10 mg total) by mouth every 6 (six) hours as needed for nausea or vomiting. (Patient not taking: Reported on 04/24/2022) 30 tablet 3   rivaroxaban (XARELTO) 20 MG TABS tablet Take 1 tablet (20 mg total) by mouth daily with supper. (Patient not taking: Reported on 04/24/2022) 30 tablet 6   rOPINIRole (REQUIP) 1 MG tablet Take 1 mg by mouth 3 (three) times daily as needed (restless leg). (Patient not taking: Reported on 04/24/2022)     sucralfate (CARAFATE) 1 GM/10ML suspension Take 10 mLs (1 g total) by mouth 3 (three) times daily before meals. (Patient not taking: Reported on 04/24/2022) 420 mL 3   traMADol (ULTRAM) 50 MG tablet Take 1 tablet (50 mg total) by mouth every 6 (six) hours as needed. 90 tablet 0   No current facility-administered medications for this visit.   Facility-Administered Medications Ordered in Other Visits  Medication Dose Route Frequency Provider Last Rate Last Admin  0.9 %  sodium chloride infusion   Intravenous Continuous Holley Bouche, NP   Stopped at 09/14/17 1440    ALLERGIES:  Allergies  Allergen Reactions   Xeloda [Capecitabine] Other (See Comments)    Blisters and pain to skin to include feet and arms   Adhesive [Tape]     Adhesive tape and band-aids cause skin irritation   Albumin (Human) Itching    Tongue thickness/increased secretions   Neosporin Original [Bacitracin-Neomycin-Polymyxin] Other (See Comments)    blister    Sulfa Antibiotics Nausea Only and Other (See Comments)    Joint paint   Erythromycin Itching and Rash    burning    PHYSICAL EXAM:  Performance status (ECOG): 2 - Symptomatic, <50% confined to bed  There were no vitals filed for this visit. Wt Readings from Last 3 Encounters:  04/24/22 187 lb 9.6 oz (85.1 kg)  04/03/22 196 lb (88.9 kg)  03/13/22 217 lb 3.2 oz (98.5 kg)   Physical Exam Vitals reviewed.  Constitutional:      Appearance: Normal appearance. She is obese.  Cardiovascular:     Rate and Rhythm: Normal rate and regular rhythm.     Pulses: Normal pulses.     Heart sounds: Normal heart sounds.  Pulmonary:     Effort: Pulmonary effort is normal.     Breath sounds: Normal breath sounds.  Neurological:     General: No focal deficit present.     Mental Status: She is alert and oriented to person, place, and time.  Psychiatric:        Mood and Affect: Mood normal.        Behavior: Behavior normal.     LABORATORY DATA:  I have reviewed the labs as listed.     Latest Ref Rng & Units 04/24/2022    8:41 AM 04/03/2022    9:28 AM 03/13/2022    9:05 AM  CBC  WBC 4.0 - 10.5 K/uL 10.9  5.5  4.8   Hemoglobin 12.0 - 15.0 g/dL 13.5  11.6  7.6   Hematocrit 36.0 - 46.0 % 40.7  36.8  24.5   Platelets 150 - 400 K/uL 105  137  163       Latest Ref Rng & Units 04/24/2022    8:41 AM 04/03/2022    9:28 AM 03/13/2022    9:05 AM  CMP  Glucose 70 - 99 mg/dL 284  192  80   BUN 8 - 23 mg/dL '16  16  14   '$ Creatinine 0.44 - 1.00 mg/dL 0.63  0.60  0.80   Sodium 135 - 145 mmol/L 136  138  137   Potassium 3.5 - 5.1 mmol/L 4.0  3.7  3.6   Chloride 98 - 111 mmol/L 100  105  104   CO2 22 - 32 mmol/L '30  27  27   '$ Calcium 8.9 - 10.3 mg/dL 9.0  8.6  8.4   Total Protein 6.5 - 8.1 g/dL 6.3  6.0  5.6   Total Bilirubin 0.3 - 1.2 mg/dL 1.8  0.8  0.7   Alkaline Phos 38 - 126 U/L 92  67  70   AST 15 - 41 U/L '29  27  31   '$ ALT 0 - 44 U/L 37  23  26     DIAGNOSTIC IMAGING:  I have independently  reviewed the scans and discussed with the patient. CT CHEST ABDOMEN PELVIS W CONTRAST  Result Date: 04/24/2022 CLINICAL DATA:  Restaging hepatocellular carcinoma.  Previous bland embolization and Y-90 radioembolization. No new symptoms per patient. * Tracking Code: BO * EXAM: CT CHEST, ABDOMEN, AND PELVIS WITH CONTRAST TECHNIQUE: Multidetector CT imaging of the chest, abdomen and pelvis was performed following the standard protocol during bolus administration of intravenous contrast. RADIATION DOSE REDUCTION: This exam was performed according to the departmental dose-optimization program which includes automated exposure control, adjustment of the mA and/or kV according to patient size and/or use of iterative reconstruction technique. CONTRAST:  128m OMNIPAQUE IOHEXOL 300 MG/ML  SOLN COMPARISON:  Abdominopelvic CT 02/08/2022.  Chest CT 10/25/2020. FINDINGS: CT CHEST FINDINGS Cardiovascular: No acute vascular findings are demonstrated. A left subclavian Port-A-Cath extends to the superior cavoatrial junction. There is atherosclerosis of the aorta, great vessels and coronary arteries. The heart size is normal. There is no pericardial effusion. Mediastinum/Nodes: There are no enlarged mediastinal, hilar or axillary lymph nodes. The thyroid gland appears unchanged. The esophagus is fluid-filled and mildly distended with a small hiatal hernia. Lungs/Pleura: No pleural effusion or pneumothorax. Stable chronic lung disease with central airway thickening and subpleural reticulation. A subpleural right middle lobe nodule measuring 6 x 5 mm on image 75/3 is unchanged from multiple prior studies and considered benign. No new or enlarging pulmonary nodules. Musculoskeletal/Chest wall: Subacute incompletely healed fractures of the left 5th through 8th ribs anteriorly. No acute fracture or evidence of osseous metastatic disease. No chest wall mass. CT ABDOMEN AND PELVIS FINDINGS Hepatobiliary: Similar chronic morphologic  changes of cirrhosis with diffuse contour irregularity of the liver. The dominant treated lesion centrally in the right hepatic lobe remains low in density without associated abnormal enhancement, measuring approximately 3.1 x 1.8 cm on image 43/2 (previously 2.8 x 2.5 cm). No significant change suggested. 12 mm low-density caudate lesion on image 44/2 appears unchanged as well. Previously demonstrated lesions in the left hepatic lobe have decreased in density, consistent with response to treatment. Segment 3 lesion measures 2.1 x 2.0 cm on image 47/2, previously 2.2 cm with higher density. There is a low-density lesion in segment 4 B measuring 11 mm on image 48/2, nearly isodense on previous study and not as well seen. No definite new or enhancing liver lesions are identified. Status post cholecystectomy without significant biliary dilatation. Pancreas: Unremarkable. No pancreatic ductal dilatation or surrounding inflammatory changes. Spleen: The spleen is at the upper limits of normal in size and demonstrates no focal abnormality. Adrenals/Urinary Tract: Both adrenal glands appear normal. The kidneys appear normal without evidence of urinary tract calculus, suspicious lesion or hydronephrosis. The bladder is nearly empty and suboptimally evaluated. Stomach/Bowel: No enteric contrast administered. Distal esophageal band noted. The stomach appears unremarkable for its degree of distension. No evidence of bowel wall thickening, distention or surrounding inflammatory change. Stable postsurgical changes from right hemicolectomy. Moderate descending and sigmoid diverticulosis. Vascular/Lymphatic: There are no enlarged abdominal or pelvic lymph nodes. Aortic and branch vessel atherosclerosis without evidence of aneurysm or large vessel occlusion. Reproductive: Status post hysterectomy. Probable residual ovarian tissue bilaterally, unchanged. No suspicious adnexal findings. Other: A moderate to large amount of ascites is  again noted, similar in volume to the previous study. No definite peritoneal nodularity identified. Musculoskeletal: No acute or significant osseous findings. IMPRESSION: 1. Interval decreased density of remaining liver lesions consistent with response to treatment. No definite new, enlarging or enhancing hepatic lesions are currently identified to suggest residual/recurrent disease. Continued follow-up recommended. 2. Persistent moderate to large volume of ascites status post interval paracentesis. No peritoneal nodularity identified. 3. No evidence of  thoracic metastatic disease. 4. Stable incidental findings including esophageal dilatation, distal colonic diverticulosis, subacute left-sided rib fractures, postsurgical changes and Aortic Atherosclerosis (ICD10-I70.0). Electronically Signed   By: Richardean Sale M.D.   On: 04/24/2022 08:37   DG OP Swallowing Func-Medicare/Speech Path  Result Date: 04/03/2022 Table formatting from the original result was not included. Images from the original result were not included. [place], [place], [address] Phone: [phone]   Fax:  [phone] Modified [facility] Swallow Patient Details Name: WEDA BAUMGARNER MRN: [id number] Date of Birth: [birth year] No data recorded Encounter Date: 04/01/2022  End of Session - 04/02/22 1334   Visit Number 1   Number of Visits 1   Authorization Type [place]   SLP Start Time 2947   SLP Stop Time  6546   SLP Time Calculation (min) 33 min   Activity Tolerance Patient tolerated treatment well     Past Medical History: Diagnosis Date  Acute pulmonary embolism ([place]) 04/15/2017  Anemia   Arthritis   RA  Cancer ([facility])   skin cancer, colon, liver  Depression   Diabetes mellitus without complication (HCC)   Dyspnea   Fibromyalgia   GERD (gastroesophageal reflux disease)   GI bleed 08/12/2017  Heart murmur   ECHO scheduled 05-25-2015  Hyperlipidemia   Neuropathy   PONV (postoperative  nausea and vomiting)   Puncture wound of great toe   Right leg DVT ([place]) 12/01/2020 Past Surgical History: Procedure Laterality Date  ABDOMINAL HYSTERECTOMY    BIOPSY  09/23/2016  Procedure: BIOPSY;  Surgeon: Danie Binder, MD;  [facility];  Service: Endoscopy;;  hepatic flexure mass  BIOPSY  02/06/2022  Procedure: BIOPSY;  Surgeon: Daneil Dolin, MD;  [facility];  Service: Endoscopy;;  CHOLECYSTECTOMY    COLONOSCOPY N/A 08/13/2017  Procedure: COLONOSCOPY;  Surgeon: Daneil Dolin, MD;  [facility];  Service: Endoscopy;  Laterality: N/A;  COLONOSCOPY WITH PROPOFOL N/A 09/23/2016  Procedure: COLONOSCOPY WITH PROPOFOL;  Surgeon: Danie Binder, MD;  [facility];  Service: Endoscopy;  Laterality: N/A;  7:30 am  ESOPHAGEAL BANDING  02/06/2022  Procedure: ESOPHAGEAL BANDING;  Surgeon: Daneil Dolin, MD;  [facility];  Service: Endoscopy;;  ESOPHAGOGASTRODUODENOSCOPY (EGD) WITH PROPOFOL N/A 02/06/2022  Procedure: ESOPHAGOGASTRODUODENOSCOPY (EGD) WITH PROPOFOL;  Surgeon: Daneil Dolin, MD;  [facility];  Service: Endoscopy;  Laterality: N/A;  patient has 1030 appt at Dr.McKinney's office. Will come to hospital after appt.  HARDWARE REMOVAL Right 05/30/2015  Procedure: HARDWARE REMOVAL;  Surgeon: Ninetta Lights, MD;  Location: [facility];  Service: Orthopedics;  Laterality: Right;  IR ANGIOGRAM SELECTIVE EACH ADDITIONAL VESSEL  07/25/2019  IR ANGIOGRAM SELECTIVE EACH ADDITIONAL VESSEL  07/25/2019  IR ANGIOGRAM SELECTIVE EACH ADDITIONAL VESSEL  01/02/2021  IR ANGIOGRAM SELECTIVE EACH ADDITIONAL VESSEL  01/02/2021  IR ANGIOGRAM SELECTIVE EACH ADDITIONAL VESSEL  01/02/2021  IR ANGIOGRAM SELECTIVE EACH ADDITIONAL VESSEL  01/18/2021  IR ANGIOGRAM SELECTIVE EACH ADDITIONAL VESSEL  02/15/2021  IR ANGIOGRAM SELECTIVE EACH ADDITIONAL VESSEL  02/15/2021  IR ANGIOGRAM VISCERAL SELECTIVE  07/25/2019  IR ANGIOGRAM VISCERAL SELECTIVE  07/25/2019  IR ANGIOGRAM VISCERAL SELECTIVE   01/02/2021  IR ANGIOGRAM VISCERAL SELECTIVE  01/02/2021  IR ANGIOGRAM VISCERAL SELECTIVE  01/18/2021  IR ANGIOGRAM VISCERAL SELECTIVE  02/15/2021  IR EMBO ARTERIAL NOT HEMORR HEMANG INC GUIDE ROADMAPPING  01/02/2021  IR EMBO TUMOR ORGAN ISCHEMIA INFARCT INC GUIDE ROADMAPPING  07/25/2019  IR EMBO TUMOR ORGAN ISCHEMIA INFARCT INC GUIDE ROADMAPPING  01/18/2021  IR EMBO TUMOR ORGAN ISCHEMIA INFARCT INC GUIDE ROADMAPPING  02/15/2021  IR RADIOLOGIST EVAL & MGMT  06/09/2019  IR RADIOLOGIST EVAL & MGMT  07/07/2019  IR RADIOLOGIST EVAL & MGMT  01/05/2020  IR RADIOLOGIST EVAL & MGMT  04/12/2020  IR RADIOLOGIST EVAL & MGMT  09/27/2020  IR RADIOLOGIST EVAL & MGMT  10/31/2020  IR RADIOLOGIST EVAL & MGMT  12/12/2020  IR RADIOLOGIST EVAL & MGMT  04/04/2021  IR RADIOLOGIST EVAL & MGMT  05/23/2021  IR RADIOLOGIST EVAL & MGMT  12/17/2021  IR US GUIDE VASC ACCESS RIGHT  07/25/2019  IR US GUIDE VASC ACCESS RIGHT  01/02/2021  IR US GUIDE VASC ACCESS RIGHT  01/18/2021  IR US GUIDE VASC ACCESS RIGHT  02/15/2021  PARTIAL COLECTOMY N/A 10/10/2016  Procedure: PARTIAL COLECTOMY;  Surgeon: Aviva Signs, MD;  Location: [facility];  Service: General;  Laterality: N/A;  planter warts Bilateral   both feet  POLYPECTOMY  09/23/2016  Procedure: POLYPECTOMY;  Surgeon: Danie Binder, MD;  Location: [facility];  Service: Endoscopy;;  transverse colon polyps times 2, rectal polyp  PORTACATH PLACEMENT Left 01/08/2022  Procedure: INSERTION PORT-A-CATH;  Surgeon: Aviva Signs, MD;  Location: [facility];  Service: General;  Laterality: Left;  skin grafts    due to planter warts  TOTAL KNEE ARTHROPLASTY Right 05/30/2015  Procedure: RIGHT TOTAL KNEE ARTHROPLASTY;  Surgeon: Ninetta Lights, MD;  Location: [facility];  Service: Orthopedics;  Laterality: Right; There were no vitals filed for this visit.  General - 04/01/22 1326    General Information  Date of Onset 03/13/22   HPI Aleecia Tapia is a 76 yo female who was referred for MBSS by Dr.Shellee Streng Delton Coombes due to history of dyphagia. She has  a past history of colon cancer, liver cancer, DM 2, HTN, CKD. Patient had an endoscopy by Dr. Gala Romney  02/06/2022 for evaluation of possible varices.  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  Patient was subsequently discharged home but was admitted to the hospital due to not being able to keep solids or liquids down.  She was followed by gastroenterology during the course of her stay and had undergone barium swallow evaluation and findings were significant for esophageal dysmotility as well as possible lower esophageal stricture.  She was concurrently noted to have some acute hypoxemic respiratory failure with concerns for community-acquired pneumonia along with UTI and sepsis was ruled out. She was advanced to full liquid and further advancement to soft diet could not be tolerated as this produced some nausea and vomiting.   Type of Study MBS-Modified Barium Swallow Study   Previous Swallow Assessment Barium Swallow 02/10/2022 and BSE 02/09/22   Diet Prior to this Study Dysphagia 3 (soft);Thin liquids   Temperature Spikes Noted No   Respiratory Status Room air   History of Recent Intubation No   Behavior/Cognition Alert;Cooperative;Pleasant mood   Oral Cavity Assessment Within Functional Limits   Oral Care Completed by SLP No   Oral Cavity - Dentition Adequate natural dentition   Vision Functional for self feeding   Self-Feeding Abilities Able to feed self   Patient Positioning Upright in chair   Baseline Vocal Quality Normal   Volitional Cough Strong   Volitional Swallow Able to elicit   Anatomy Within functional limits   Pharyngeal Secretions Not observed secondary MBS      Oral Preparation/Oral Phase - 04/01/22  1330    Oral Preparation/Oral Phase  Oral Phase Within functional limits   Essentially WNL, however as study progressed, Pt with oral holding and esophageal sweep revealed extensive amount of retained barium in the esophagus   Electrical stimulation - Oral Phase   Was Electrical Stimulation Used No      04/01/22 1422 Pharyngeal Phase Pharyngeal Phase Impaired Pharyngeal - Thin Pharyngeal- Thin Teaspoon Swallow initiation at vallecula;WFL Pharyngeal- Thin Cup Swallow initiation at pyriform sinus;Penetration/Aspiration during swallow Pharyngeal Material enters airway, CONTACTS cords and then ejected out Pharyngeal- Thin Straw Swallow initiation at pyriform sinus;Penetration/Aspiration before swallow Pharyngeal Material enters airway, passes BELOW cords without attempt by patient to eject out (silent aspiration) (trace amount aspirated to posterior tracheal wall, cued cough cleared) Pharyngeal - Solids Pharyngeal- Puree WFL Pharyngeal- Regular WFL Pharyngeal- Pill Pharyngeal residue - valleculae;Pharyngeal residue - pyriform;Other (Comment) (pill with thin, reflux after the swallow back to pyriforms) Electrical Stimulation - Pharyngeal Phase Was Electrical Stimulation Used No  Cricopharyngeal Phase - 04/01/22 1331    Cervical Esophageal Phase  Cervical Esophageal Phase Impaired    Cervical Esophageal Phase - Solids  Puree Esophageal backflow into the pharynx;Esophageal backflow into cervical esophagus    Cervical Esophageal Phase - Comment  Other Esophageal Phase Observations See radiologist's report; barium filled esophagus worse with purees and solids, retrograde movement     Radiologist's comment: <<Marked esophageal dysmotility. Relative narrowing at GE junction. Small sliding hiatal hernia incidentally noted. 12.5 mm diameter barium tablet made it to the distal esophagus but was not seen passing into the stomach>>  Plan - 04/01/22 1334   Clinical Impression Statement Pt presents with mild oropharyngeal dysphagia and suspected primary esophageal dysphagia with significant retention of barium noted in the esophagus with purees and solids with retrograde movement and reflux into the pyriforms observed. Pt assessed with barium tinged thin via tsp/cup/straw, puree, regular  textures, and barium tablet with thins. Swallow trigger was at the level of the valleculae and pyriforms with liquids; Pt with trace penetration and trace aspiration (silent) of thins when taking straw sips as liquids filled the pyriforms and spilled into laryngeal vestibule with aspiration down posterior tracheal wall (x2 in trace amounts). No spontaneous cough elicited, however once SLP cued Pt to cough, aspirate was removed. Esophageal sweep was completed after initial presentations of thins and esophagus was clear. It was then completed after purees and a significant amount of barium puree remained in the esophagus with to and fro movement noted. This continued to build with increased PO of puree, regular, and barium tablet. Pharyngoesophageal reflux noted of barium back into the pyriforms. The barium tablet did not pass through the LES. Recommend continuing with full liquids due to severity of esophageal dysphagia. Pt reports poor tolerance of soft solids at home and can only tolerate liquids without regurgitation. Pt will need to sit upright for all drinking and avoid bending after meals to allow benefit of gravity assist and to minimize risk of backflow into the pharynx and aspiration into airway. Pt will need to continue f/u with GI/Dr. Gala Romney for esophageal dysphagia. Pt and daughter viewed the imaging from the MBSS and were given written information regarding esophageal dysphagia, as well as my contact information should they have further questions. No further SLP services indicated at this time.    Patient will benefit from skilled therapeutic intervention in order to improve the following deficits and impairments:  Dysphagia, oropharyngeal phase  Recommendations/Treatment - 04/01/22 1333    Swallow Evaluation Recommendations  Recommended Consults Consider esophageal assessment   SLP Diet Recommendations Thin   full liquids  Liquid Administration via Cup   Medication Administration Whole meds with liquid    Supervision Patient able to self feed   Postural Changes Seated upright at 90 degrees;Remain upright for at least 30 minutes after feeds/meals      Prognosis - 04/01/22 1333    Prognosis  Prognosis for Safe Diet Advancement Guarded   Barriers to Reach Goals Severity of deficits   Barriers/Prognosis Comment severity of esophageal dysphagia    Individuals Consulted  Consulted and Agree with Results and Recommendations Patient;Family member/caregiver   Family Member Consulted daughter   Report Sent to  Referring physician     Problem List Patient Active Problem List  Diagnosis Date Noted  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[place]) 02/26/2022  Dysphagia 02/11/2022  Cirrhosis of liver with ascites ([place])   Alcoholic cirrhosis of liver with ascites ([place]) 02/10/2022  Sepsis secondary to UTI ([place]) 02/09/2022  Acute respiratory failure with hypoxia ([place]) 02/09/2022   Class: Acute  Anemia 02/08/2022  Intractable nausea and vomiting 02/07/2022  History of DVT (deep vein thrombosis) 02/07/2022  Normocytic anemia 01/30/2022  Onychomycosis 11/08/2021  Gastroesophageal reflux disease 11/08/2021  Chronic diarrhea 12/01/2020  RA (rheumatoid arthritis) ([place]) 12/01/2020  CKD (chronic kidney disease), stage III ([place]) 12/01/2020  Hepatocellular carcinoma ([place]) 05/17/2019  GI bleed 08/12/2017  History of pulmonary embolism 08/12/2017  Type 2 diabetes mellitus ([place]) 08/12/2017  RLS (restless legs syndrome) 08/12/2017  HTN (hypertension) 08/12/2017  Malignant neoplasm of colon ([place]) 11/26/2016  Colon cancer ([place]) 10/10/2016  DJD (degenerative joint disease) of knee 05/30/2015 Thank you, Genene Churn, [place] Genene Churn, [place] 04/01/2022, 1:35 PM [place] [address] [place], [place], [address] Phone: [phone]   Fax:  [phone] Name: THEOLA CUELLAR MRN: [id number] Date of Birth: November 30, 1945 CLINICAL DATA:  Dysphagia. Cough/GE reflux disease/other secondary diagnosis EXAM: MODIFIED  BARIUM SWALLOW TECHNIQUE: Different consistencies of barium were administered orally to the patient by the Speech Pathologist. Imaging of the pharynx was performed in the lateral projection. The radiologist was present in the fluoroscopy room for this study, providing personal supervision. FLUOROSCOPY: Radiation Exposure Index (as provided by the fluoroscopic device): 107.1 mGy [place] COMPARISON:  None Available. FINDINGS: Vestibular Penetration: Laryngeal penetration to the vocal cords with thin barium, multiple episodes. Aspiration: Minimal aspiration into proximal trachea on several occasions. No spontaneous cough reflex. Other: Marked esophageal dysmotility. Relative narrowing at GE junction. Small sliding hiatal hernia incidentally noted. 12.5 mm diameter barium tablet made it to the distal esophagus but was not seen passing into the stomach IMPRESSION: Swallowing dysfunction as above. Please refer to the Speech Pathologists report for complete details and recommendations. Electronically Signed   By: Lavonia Dana M.D.   On: 04/01/2022 14:32    ASSESSMENT:  1.  Hepatocellular carcinoma: -MRI of the abdomen with Eovist on 04/21/2019 showed hypoenhancing lesion in the liver, which was not typical for [facility].  It was not significantly hypermetabolic on prior PET/CT.  This lesion has been gradually increasing since 10/08/2016. - AFP was normal at 3.9. -Biopsy on 05/11/2019 shows well to moderately differentiated hepatocellular carcinoma, steatohepatitic variant. -CT chest on 05/30/2019 was negative for metastatic disease. -Bland embolization of the hepatic lesion on 07/25/2019. -MRI of the abdomen with and without contrast on 09/21/2020 showed previous treated lesion in the central aspect of the liver measures 4.4 x 3.2 cm, previously 3.1 x 2.6 cm.  Hypervascular left liver lobe  lesion measuring 1.3 x 1.1 cm.  6 mm hypervascular focus noted in the inferior aspect of the right lobe of the liver. -CTAP with and without  contrast on 10/25/2020 showed left liver lesion measuring 1.8 x 1.4 cm, for inferior right liver lesion measuring 1 x 0.9 cm which are new.  Recurrent hyperenhancing tumor at the treated tumor site in the central liver, measuring 6 x 5 cm with extension into the caudate lobe.  No evidence of metastatic disease in the chest. - Left lobe liver biopsy on 11/29/2020-well to moderately differentiated [place]. - MRI (12/12/2021): Several new arterial phase enhancing lesions concerning for progressive malignancy. - Cycle 1 of Atezolizumab on 12/26/2021.   2.  Stage IIa (CT3CN0) colon cancer: -Right hemicolectomy on 10/10/2016, grade 2 adenocarcinoma, positive LVSI, margins negative, 0/19 lymph nodes positive. -Because of positive LVSI, she was recommended Xeloda.  She could not tolerate more than 2 cycles.  She developed severe hand-foot skin reaction. -PET scan on 02/23/2018- for metastatic disease.  Liver mass was also not seen. -CEA on 03/29/2019 was 4.5. -CT [facility] on 03/29/2019 showed mass of the liver measuring 5.2 cm, previously 4.8 cm.   3.  Diarrhea: -She has watery diarrhea up to once per day.  She takes Imodium 1-2 tablets/day which helps.  She had this diarrhea since surgery.   4.  Unprovoked PE: -Incidental PE on CT chest in August 2018.  She was treated with Xarelto 20 mg for a year. -Dose was reduced to 10 mg daily in October 2019.  She is tolerating it without any problems.   PLAN:  1.  Liver confined hepatocellular carcinoma: - She has completed 4 cycles of bevacizumab and Atezolizumab. - Reviewed CT CAP on 04/23/2022.  It showed interval decreased liver lesions with no new lesions seen.  Persistent moderate to large volume ascites with no peritoneal nodularity.  No evidence of metastatic disease in the chest. - Last AFP was 2168.  Labs today shows grossly normal CBC.  LFTs show elevated total bilirubin 1.8. - Even though she has gotten good response with immunotherapy and bevacizumab combination,  her functional status is deteriorating. - We had prolonged discussion about continuation of treatment versus best supportive care in the form of hospice.  We chose with the later option because of poor quality of life. - She complains of right upper quadrant pains occasionally severe.  We will start her on tramadol 50 mg every 8 hours as needed.  We will hold her treatment today.  We will make a referral to palliative/hospice care. - As she is not able to drink or eat, she will receive final mL of normal saline today.   2.  Stage IIa (CT3CN0) colon cancer: - Last CEA was normal at 4.7.  Last CT scan did not show any recurrence.   3.  Unprovoked PE: - Continue Xarelto 20 mg daily.  No bleeding reported.   4.  Normocytic anemia: - She has completed 3 infusions of Venofer.  Hemoglobin today improved to 13.5.  5.  Hypomagnesemia: - Continue magnesium supplements.  6.  Lower extremity swelling: - Continue Lasix as needed.     Orders placed this encounter:  No orders of the defined types were placed in this encounter.    Derek Jack, MD [place] [phone]

## 2022-04-26 ENCOUNTER — Other Ambulatory Visit: Payer: Self-pay | Admitting: Hematology

## 2022-04-27 ENCOUNTER — Encounter: Payer: Self-pay | Admitting: Hematology

## 2022-04-27 ENCOUNTER — Encounter (HOSPITAL_COMMUNITY): Payer: Self-pay | Admitting: Hematology

## 2022-05-07 ENCOUNTER — Encounter: Payer: Self-pay | Admitting: *Deleted

## 2022-07-18 DEATH — deceased

## 2023-01-27 ENCOUNTER — Encounter (HOSPITAL_COMMUNITY): Payer: Self-pay | Admitting: Hematology

## 2023-01-27 ENCOUNTER — Encounter: Payer: Self-pay | Admitting: Hematology

## 2023-02-03 ENCOUNTER — Encounter: Payer: Medicare Other | Admitting: Internal Medicine
